# Patient Record
Sex: Male | Born: 1945 | Race: White | Hispanic: No | Marital: Single | Smoking: Former smoker
Health system: Southern US, Community
[De-identification: ages and names within clinical notes are randomized; demographics above are authoritative.]

## PROBLEM LIST (undated history)

## (undated) DIAGNOSIS — I480 Paroxysmal atrial fibrillation: Secondary | ICD-10-CM

## (undated) DIAGNOSIS — R490 Dysphonia: Secondary | ICD-10-CM

## (undated) DIAGNOSIS — N189 Chronic kidney disease, unspecified: Secondary | ICD-10-CM

## (undated) DIAGNOSIS — I10 Essential (primary) hypertension: Secondary | ICD-10-CM

## (undated) DIAGNOSIS — E785 Hyperlipidemia, unspecified: Secondary | ICD-10-CM

## (undated) DIAGNOSIS — J984 Other disorders of lung: Secondary | ICD-10-CM

## (undated) DIAGNOSIS — H9190 Unspecified hearing loss, unspecified ear: Secondary | ICD-10-CM

## (undated) DIAGNOSIS — Z8669 Personal history of other diseases of the nervous system and sense organs: Secondary | ICD-10-CM

## (undated) DIAGNOSIS — Z951 Presence of aortocoronary bypass graft: Secondary | ICD-10-CM

## (undated) DIAGNOSIS — J383 Other diseases of vocal cords: Secondary | ICD-10-CM

## (undated) DIAGNOSIS — Z8639 Personal history of other endocrine, nutritional and metabolic disease: Secondary | ICD-10-CM

## (undated) DIAGNOSIS — I255 Ischemic cardiomyopathy: Secondary | ICD-10-CM

## (undated) DIAGNOSIS — S2242XA Multiple fractures of ribs, left side, initial encounter for closed fracture: Secondary | ICD-10-CM

## (undated) DIAGNOSIS — I2510 Atherosclerotic heart disease of native coronary artery without angina pectoris: Secondary | ICD-10-CM

## (undated) DIAGNOSIS — R0600 Dyspnea, unspecified: Secondary | ICD-10-CM

## (undated) DIAGNOSIS — Z9889 Other specified postprocedural states: Secondary | ICD-10-CM

## (undated) DIAGNOSIS — R55 Syncope and collapse: Principal | ICD-10-CM

## (undated) DIAGNOSIS — I5022 Chronic systolic (congestive) heart failure: Secondary | ICD-10-CM

## (undated) DIAGNOSIS — R911 Solitary pulmonary nodule: Secondary | ICD-10-CM

## (undated) DIAGNOSIS — N400 Enlarged prostate without lower urinary tract symptoms: Secondary | ICD-10-CM

## (undated) HISTORY — DX: Ischemic cardiomyopathy: I25.5

## (undated) HISTORY — PX: WISDOM TOOTH EXTRACTION: SHX21

## (undated) HISTORY — PX: EYE SURGERY: SHX253

## (undated) HISTORY — DX: Hyperlipidemia, unspecified: E78.5

## (undated) HISTORY — DX: Solitary pulmonary nodule: R91.1

## (undated) HISTORY — DX: Dysphonia: R49.0

## (undated) HISTORY — DX: Chronic kidney disease, unspecified: N18.9

## (undated) HISTORY — DX: Other diseases of vocal cords: J38.3

## (undated) HISTORY — PX: OTHER SURGICAL HISTORY: SHX169

## (undated) HISTORY — DX: Unspecified hearing loss, unspecified ear: H91.90

## (undated) HISTORY — DX: Atherosclerotic heart disease of native coronary artery without angina pectoris: I25.10

## (undated) HISTORY — DX: Personal history of other endocrine, nutritional and metabolic disease: Z86.39

## (undated) HISTORY — DX: Dyspnea, unspecified: R06.00

## (undated) SURGERY — Surgical Case
Anesthesia: *Unknown

---

## 1996-04-03 HISTORY — PX: FINGER SURGERY: SHX640

## 1999-04-11 ENCOUNTER — Ambulatory Visit (HOSPITAL_BASED_OUTPATIENT_CLINIC_OR_DEPARTMENT_OTHER): Admission: RE | Admit: 1999-04-11 | Discharge: 1999-04-11 | Payer: Self-pay | Admitting: Surgery

## 2006-11-08 ENCOUNTER — Ambulatory Visit: Payer: Self-pay | Admitting: Internal Medicine

## 2007-01-09 ENCOUNTER — Ambulatory Visit: Payer: Self-pay | Admitting: Internal Medicine

## 2007-03-20 DIAGNOSIS — J984 Other disorders of lung: Secondary | ICD-10-CM | POA: Insufficient documentation

## 2007-03-20 DIAGNOSIS — I10 Essential (primary) hypertension: Secondary | ICD-10-CM | POA: Insufficient documentation

## 2007-03-21 ENCOUNTER — Ambulatory Visit: Payer: Self-pay | Admitting: Internal Medicine

## 2007-04-18 ENCOUNTER — Encounter: Payer: Self-pay | Admitting: Internal Medicine

## 2007-04-24 ENCOUNTER — Telehealth: Payer: Self-pay | Admitting: Internal Medicine

## 2008-08-03 ENCOUNTER — Ambulatory Visit: Payer: Self-pay | Admitting: Internal Medicine

## 2008-09-08 ENCOUNTER — Ambulatory Visit: Payer: Self-pay | Admitting: Internal Medicine

## 2008-09-22 ENCOUNTER — Ambulatory Visit: Payer: Self-pay | Admitting: Internal Medicine

## 2009-07-13 ENCOUNTER — Ambulatory Visit: Payer: Self-pay | Admitting: Internal Medicine

## 2009-08-10 ENCOUNTER — Ambulatory Visit: Payer: Self-pay | Admitting: Internal Medicine

## 2009-09-07 ENCOUNTER — Ambulatory Visit: Payer: Self-pay | Admitting: Internal Medicine

## 2009-10-06 ENCOUNTER — Encounter: Payer: Self-pay | Admitting: Internal Medicine

## 2009-10-14 ENCOUNTER — Ambulatory Visit: Payer: Self-pay | Admitting: Internal Medicine

## 2010-01-07 ENCOUNTER — Telehealth (INDEPENDENT_AMBULATORY_CARE_PROVIDER_SITE_OTHER): Payer: Self-pay | Admitting: *Deleted

## 2010-01-17 ENCOUNTER — Ambulatory Visit: Payer: Self-pay | Admitting: Internal Medicine

## 2010-01-17 DIAGNOSIS — J45909 Unspecified asthma, uncomplicated: Secondary | ICD-10-CM | POA: Insufficient documentation

## 2010-05-03 NOTE — Miscellaneous (Signed)
Summary: Orders Update pft charges  Clinical Lists Changes  Orders: Added new Service order of Carbon Monoxide diffusing w/capacity (94720) - Signed Added new Service order of Lung Volumes (94240) - Signed Added new Service order of Spirometry (Pre & Post) (94060) - Signed 

## 2010-05-03 NOTE — Progress Notes (Signed)
Summary: PA for Dulera > needs OV  Phone Note Outgoing Call   Call placed by: Vernie Murders,  January 07, 2010 2:14 PM Call placed to: Insurer Summary of Call: Recieved PA for [facility] from [facility] [address].  Called to [employer] to initiaite PA.  Awaiting fax to be sent to triage faxline. Initial call taken by: Vernie Murders,  January 07, 2010 2:18 PM  Follow-up for Phone Call        Form in MW lookat to be completed. Follow-up by: Vernie Murders,  January 07, 2010 2:34 PM  Additional Follow-up for Phone Call Additional follow up Details #1::        Per MW- needs ov to complete PA form- can give samples in the meantime. LMOMTCB. Additional Follow-up by: Vernie Murders,  January 13, 2010 3:06 PM    Additional Follow-up for Phone Call Additional follow up Details #2::    Pt returned call to [facility].  Informed of above per MW.  OV scheduled with MW on 10.17.11 at 9:40am and 1 sample of dulera 100/5 left at front for pick up.  Pt aware and verbalized understanding.   Follow-up by: Gweneth Dimitri RN,  January 13, 2010 3:49 PM

## 2010-05-03 NOTE — Assessment & Plan Note (Signed)
Summary: Pulmonary/ f/u ov   Primary Provider/Referring Provider:  none  CC:  Follow up, Cough is better, still coughing with phlem which is clear, pt states he still has SOB, and pt states he feels he can't get quite enough air sometimes.  History of Present Illness: 31 yowm quit smoking 1981  with incidentally noted multiple right middle lobe nodules detected by CT scan in April 2008 during evaluation of what turned out to be nothing more than a callous involving the left posterior rib projecting over the left lower lobe.  Aug 03, 2008 co cough and congestion since 12/2007 , worse in winter, better on mucinex and doe x exertion worse in am.   no hemoptysis or sign doe though not that active. .  Took zpak since 4/28 not really much better.  mucinex or mucinex dm as needed for cough Zegerid 40 one at bedtime x 4 weeks then return to office prednisone 10 4 each am x 2days, 2x2days, 1x2days and stop  100% better  September 08, 2008 ov gradually worse again after prednisone even though stayed on Zegerid, hoarseness and dyspnea worsened with sense of throat congestion, using lots chloraseptic and  cepacol lozenges  September 22, 2008 much better after prednisone  but stil sensation of throat congestion self rx with otc's, admits not consistent with rec diet changes including avoiding mehthol products. rec nexium x 1 month and no benefit and Dr Ezzard Standing said throat was fine.  July 13, 2009 Followup.  Pt c/o dyspnea no better since last seen.  He also c/o cough with thick clear sputum.  He states that he wakes up in the night feeling like something is stuck in throat.  Was told by ENT that he has COPD.  He states that he used his family members primatine mist inhaler and this seemed to "open him up".  Also wants to discuss disability.   walking 2 miles but hills are a problem. no purulent sputum.  rec start dulera  Aug 10, 2009 Follow up, Cough is better, still coughing with phlem which is clear, pt states he  still has SOB, pt states he feels he can't get quite enough air sometimes.  much better on dulera. Pt denies any significant sore throat, dysphagia, itching, sneezing,  nasal congestion or excess secretions,  fever, chills, sweats, unintended wt loss, pleuritic or exertional cp, hempoptysis, noct symptoms.     Allergies: 1)  Sulfa  Past History:  Past Medical History: Hypertension Hoarseness onset 11/09....................Gerald Rosario COPD     - Start dulera 100 2  two times a day July 13, 2009 > better    - HFA 90% Aug 10, 2009   Vital Signs:  Patient profile:   65 year old male Height:      68 inches (172.72 cm) Weight:      177 pounds (80.45 kg) BMI:     27.01 O2 Sat:      98 % on Room air Temp:     96.7 degrees F (35.94 degrees C) oral Pulse rate:   82 / minute BP sitting:   120 / 90  (left arm) Cuff size:   regular  O2 Flow:  Room air CC: Follow up, Cough is better, still coughing with phlem which is clear, pt states he still has SOB, pt states he feels he can't get quite enough air sometimes Comments Meds and allergies Jacklynn Ganong  Aug 10, 2009 9:35 AM     Physical Exam  Additional Exam:  wt  182 September 08, 2008 > 179 September 22, 2008 > 173 July 13, 2009 > 177 Aug 10, 2009  Ambulatory healthy appearing white male in no acute distress mod hoarse with freq tc Afeb with normal vital signs HEENT: nl dentition, turbinates, and orophanx. Nl external ear canals without cough reflex Neck without JVD/Nodes/TM Lungs classic psuedowheeze worse on insp with minimal  exp wheeze bilaterally RRR no s3 or murmur or increase in P2 Abd soft and benign with nl excursion in the supine position. No bruits or organomegaly Ext warm without calf tenderness, cyanosis clubbing or edema      Impression & Recommendations:  Problem # 1:  DYSPNEA (ICD-786.09)  DDX of  difficult airways managment all start with A and  include Adherence, Ace Inhibitors, Acid Reflux, Active Sinus Disease,  Alpha 1 Antitripsin deficiency, Anxiety masquerading as Airways dz,  ABPA,  allergy(esp in young), Aspiration (esp in elderly), Adverse effects of DPI,  Active smokers, plus one B  = Beta blocker use..   Adherence and Acid reflux are the main issues that need continued attention pending return for pft's and cxr   Each maintenance medication was reviewed in detail including most importantly the difference between maintenance prns and under what circumstances the prns are to be used.  In addition, these two groups (for which the patient should keep up with refills) were distinguished from a third group :  meds that are used only short term with the intent to complete a course of therapy and then not refill them.  The med list was then fully reconciled and reorganized to reflect this important distinction.   Medications Added to Medication List This Visit: 1)  Prilosec Otc 20 Mg Tbec (Omeprazole magnesium) .... Take  one 30-60 min before first meal of the day 2)  Pepcid Ac Maximum Strength 20 Mg Tabs (Famotidine) .... One at bedtime 3)  Prednisone 10 Mg Tabs (Prednisone) .... 4 each am x 2days, 2x2days, 1x2days and stop  Other Orders: T-2 View CXR (71020TC) Est. Patient Level II (44010)  Patient Instructions: 1)  Dulera 100 2 puffs first thing  in am and 2 puffs again in pm about 12 hours later  2)  Work on inhaler technique:  relax and blow all the way out then take a nice smooth deep breath back in, triggering the inhaler at same time you start breathing in 3)  Prednisone 10 mg x 6 days 4)  Stay prilosec 20 mg Take  one 30-60 min before first meal of the day and pepcid 20 mg at bedtime 5)  Please schedule a follow-up appointment in 4 weeks with PFT's on return Prescriptions: PREDNISONE 10 MG  TABS (PREDNISONE) 4 each am x 2days, 2x2days, 1x2days and stop  #14 x 0   Entered and Authorized by:   Nyoka Cowden MD   Signed by:   Nyoka Cowden MD on 08/10/2009   Method used:   Electronically to         [facility] [id number]* (retail)       [address] [place], [place]  [address]       Ph: [phone]       Fax: [phone]   RxID:   [id number]   Prevention & Chronic Care Immunizations   Influenza vaccine: Not documented    Tetanus booster: Not documented    Pneumococcal vaccine: Not documented    H. zoster  vaccine: Not documented  Colorectal Screening   Hemoccult: Not documented    Colonoscopy: Not documented  Other Screening   PSA: Not documented   Smoking status: quit  (03/20/2007)  Lipids   Total Cholesterol: Not documented   LDL: Not documented   LDL Direct: Not documented   HDL: Not documented   Triglycerides: Not documented  Hypertension   Last Blood Pressure: 120 / 90  (08/10/2009)   Serum creatinine: Not documented   Serum potassium Not documented  Self-Management Support :    Hypertension self-management support: Not documented

## 2010-05-03 NOTE — Assessment & Plan Note (Signed)
Summary: Pulmonary/ final  f/u hfa now 90% effective, minimal hoarseness   Primary Provider/Referring Provider:  none  CC:  Followup.  Pt states that his breathing has improved.  Doing well with qvar and denies any complaints..  History of Present Illness: 22 yowm quit smoking 1981  with incidentally noted multiple right middle lobe nodules detected by CT scan in April 2008 during evaluation of what turned out to be nothing more than a callous involving the left posterior rib projecting over the left lower lobe.  Aug 03, 2008 co cough and congestion since 12/2007 , worse in winter, better on mucinex and doe x exertion worse in am.   no hemoptysis or sign doe though not that active. .  Took zpak since 4/28 not really much better.  mucinex or mucinex dm as needed for cough Zegerid 40 one at bedtime x 4 weeks then return to office prednisone 10 4 each am x 2days, 2x2days, 1x2days and stop  100% better  September 08, 2008 ov gradually worse again after prednisone even though stayed on Zegerid, hoarseness and dyspnea worsened with sense of throat congestion, using lots chloraseptic and  cepacol lozenges  September 22, 2008 much better after prednisone  but stil sensation of throat congestion self rx with otc's, admits not consistent with rec diet changes including avoiding methol products. rec nexium x 1 month and no benefit and Dr Ezzard Standing said throat was fine.  July 13, 2009 Followup.  Pt c/o dyspnea no better since last seen.  He also c/o cough with thick clear sputum.  He states that he wakes up in the night feeling like something is stuck in throat.  Was told by ENT that he has COPD.  He states that he used his family members primatine mist inhaler and this seemed to "open him up".  Also wants to discuss disability.   walking 2 miles but hills are a problem. no purulent sputum.  rec start dulera  May 10, 2011ov .  much better on dulera.  September 07, 2009 Pt is here for a 4 week f/u appt to discuss PFT  results.   Pt states breathing has improved on Dulera.  Pt states he will occ cough up clear sputum and also c/o "knot in throat" only resolved while on prednisone, then recurred despite max gerd rx.   rec try qvar   October 14, 2009 Followup.  Pt states that his breathing has improved.  Doing well with qvar and denies any complaints. prefers dulera over qvar no real change in voice on qvar and better breathing on dulera 100  - Pt denies any significant sore throat, dysphagia, itching, sneezing,  nasal congestion or excess secretions,  fever, chills, sweats, unintended wt loss, pleuritic or exertional cp, hempoptysis, change in activity tolerance  orthopnea pnd or leg swelling Pt also denies any obvious fluctuation in symptoms with weather or environmental change or other alleviating or aggravating factors.       Allergies: 1)  Sulfa  Past History:  Past Medical History: Hypertension Hoarseness onset 11/09....................Marvetta Gibbons      - neg w/u 09/2008 COPD     - Start dulera 100  July 13, 2009 > better but "knot in throat" so try qvar September 07, 2009 > prefers duoera    - [place] 90% Aug 10, 2009     - PFT's September 07, 2009 wnl x minimal nonspecific mid flow reduction while on dulera  Vital Signs:  Patient  profile:   65 year old male Weight:      176 pounds O2 Sat:      98 % on Room air Temp:     98.0 degrees F oral Pulse rate:   75 / minute BP sitting:   110 / 70  (left arm)  Vitals Entered By: Vernie Murders (October 14, 2009 11:50 AM)  O2 Flow:  Room air  Physical Exam  Additional Exam:  wt  182 September 08, 2008 > 179 September 22, 2008 > 173 July 13, 2009 > 177 Aug 10, 2009 > 176 September 07, 2009 > 176 October 14, 2009  Ambulatory healthy appearing white male in no acute distress minimally hoarse HEENT: nl dentition, turbinates, and orophanx. Nl external ear canals without cough reflex Neck without JVD/Nodes/TM Lungs clear to a and p RRR no s3 or murmur or increase in P2 Abd soft and benign  with nl excursion in the supine position. No bruits or organomegaly Ext warm without calf tenderness, cyanosis clubbing or edema      CXR  Procedure date:  10/14/2009  Findings:       Comparison: PA and lateral chest 03/21/2007 and 08/03/2008.   Findings: Lungs are clear.  Heart size is normal.  No pleural effusion. Old left rib fracture noted.   IMPRESSION: No acute disease.  Stable compared prior exam  Impression & Recommendations:  Problem # 1:  DYSPNEA (ICD-786.09)  All goals of asthma met including optimal function and elimination of symptoms with minimum need for rescue therapy. Contingencies discussed today including the rule of two's for use of proventil.   I spent extra time with the patient today explaining optimal mdi  technique.  This improved from  75-90%   Each maintenance medication was reviewed in detail including most importantly the difference between maintenance and as needed and under what circumstances the prns are to be used.   Orders: Est. Patient Level III (62130) HFA Instruction [phone]) Prescription Created Electronically [phone])  Medications Added to Medication List This Visit: 1)  Dulera 100-5 Mcg/act Aero (Mometasone furo-formoterol fum) .... 2 puffs first thing  in am and 2 puffs again in pm about 12 hours later  Other Orders: T-2 View CXR ([id number])  Patient Instructions: 1)  Need a primary care doctor - Gerarda Fraction or Elam Raymore would be good choice  Prescriptions: DULERA 100-5 MCG/ACT AERO (MOMETASONE FURO-FORMOTEROL FUM) 2 puffs first thing  in am and 2 puffs again in pm about 12 hours later  #1 x 11   Entered and Authorized by:   Nyoka Cowden MD   Signed by:   Nyoka Cowden MD on 10/14/2009   Method used:   Print then Give to Patient   RxID:   [id number] QVAR 80 MCG/ACT  AERS (BECLOMETHASONE DIPROPIONATE) 2 puffs first thing  in am and 2 puffs again in pm about 12 hours later  #1 x 11   Entered and Authorized by:    Nyoka Cowden MD   Signed by:   Nyoka Cowden MD on 10/14/2009   Method used:   Print then Give to Patient   RxID:   [id number]

## 2010-05-03 NOTE — Assessment & Plan Note (Signed)
Summary: Pulmonary/ f/u ov start dulera 100   Primary Provider/Referring Provider:  none  CC:  Followup.  Pt c/o dyspnea no better since last seen.  He also c/o cough with thick clear sputum.  He states that he wakes up in the night feeling like something is stuck in throat.  Was told by ENT that he has COPD.  He states that he used his family members primatine mist inhaler and this seemed to "open him up".  Also wants to discuss disability.  .  History of Present Illness: 47 yowm quit smoking 1981  with incidentally noted multiple right middle lobe nodules detected by CT scan in April 2008 during evaluation of what turned out to be nothing more than a callous involving the left posterior rib projecting over the left lower lobe.  Aug 03, 2008 co cough and congestion since 12/2007 , worse in winter, better on mucinex and doe x exertion worse in am.   no hemoptysis or sign doe though not that active. .  Took zpak since 4/28 not really much better.  mucinex or mucinex dm as needed for cough Zegerid 40 one at bedtime x 4 weeks then return to office prednisone 10 4 each am x 2days, 2x2days, 1x2days and stop  100% better  September 08, 2008 ov gradually worse again after prednisone even though stayed on Zegerid, hoarseness and dyspnea worsened with sense of throat congestion, using lots chloraseptic and  cepacol lozenges  September 22, 2008 much better after prednisone  but stil sensation of throat congestion self rx with otc's, admits not consistent with rec diet changes including avoiding mehthol products. rec nexium x 1 month and no benefit and Dr Ezzard Standing said throat was fine.  July 13, 2009 Followup.  Pt c/o dyspnea no better since last seen.  He also c/o cough with thick clear sputum.  He states that he wakes up in the night feeling like something is stuck in throat.  Was told by ENT that he has COPD.  He states that he used his family members primatine mist inhaler and this seemed to "open him up".  Also  wants to discuss disability.   walking 2 miles but hills are a problem. no purulent sputum.  Pt denies any significant sore throat, dysphagia, itching, sneezing,  nasal congestion or excess secretions,  fever, chills, sweats, unintended wt loss, pleuritic or exertional cp, hempoptysis, change in activity tolerance  orthopnea pnd or leg swelling.       Current Medications (verified): 1)  None  Allergies (verified): 1)  Sulfa  Past History:  Past Medical History: Hypertension Hoarseness onset 11/09....................Marvetta Gibbons COPD     - Start dulera 100 2  two times a day July 13, 2009   Social History: Former smoker.  Quit in 1982.  Smoked up to 2 ppd x 18 years. Unemployed  Vital Signs:  Patient profile:   65 year old male Weight:      173 pounds BMI:     26.40 O2 Sat:      96 % on Room air Temp:     97.4 degrees F oral Pulse rate:   86 / minute BP sitting:   120 / 78  (left arm)  Vitals Entered By: Vernie Murders (July 13, 2009 10:36 AM)  O2 Flow:  Room air  Physical Exam  Additional Exam:  wt  182 September 08, 2008 > 179 September 22, 2008 > 173 July 13, 2009  Ambulatory healthy appearing  white male in no acute distress mod hoarse with freq tc Afeb with normal vital signs HEENT: nl dentition, turbinates, and orophanx. Nl external ear canals without cough reflex Neck without JVD/Nodes/TM Lungs classic psuedowheeze worse on insp with some true exp wheeze bilaterally RRR no s3 or murmur or increase in P2 Abd soft and benign with nl excursion in the supine position. No bruits or organomegaly Ext warm without calf tenderness, cyanosis clubbing or edema      Impression & Recommendations:  Problem # 1:  COUGH (ICD-786.2)  The most common causes of chronic cough in immunocompetent adults include: upper airway cough syndrome (UACS), previously referred to as postnasal drip syndrome,  caused by variety of rhinosinus conditions; (2) asthma; (3) GERD; (4) chronic bronchitis  from cigarette smoking or other inhaled environmental irritants; (5) nonasthmatic eosinophilic bronchitis; and (6) bronchiectasis. These conditions, singly or in combination, have accounted for up to 94% of the causes of chronic cough in prospective studies.   Since not responsive to nexium this is probably chronic asthma masquerading as uacs so try dulera.  I spent extra time with the patient today explaining optimal mdi  technique.  This improved from  25-75%  Orders: Est. Patient Level II (16109)  Medications Added to Medication List This Visit: 1)  Dulera 100-5 Mcg/act Aero (Mometasone furo-formoterol fum) .... 2 puffs first thing  in am and 2 puffs again in pm about 12 hours later 2)  Prednisone 10 Mg Tabs (Prednisone) .... 4 each am x 2days, 2x2days, 1x2days and stop  Patient Instructions: 1)  Dulera 100 2 puffs first thing  in am and 2 puffs again in pm about 12 hours later  2)  Work on inhaler technique:  relax and blow all the way out then take a nice smooth deep breath back in, triggering the inhaler at same time you start breathing in and hold a few seconds 3)  GERD (REFLUX)  is a common cause of respiratory symptoms. It commonly presents without heartburn and can be treated with medication, but also with lifestyle changes including avoidance of late meals, excessive alcohol, smoking cessation, and avoid fatty foods, chocolate, peppermint, colas, red wine, and acidic juices such as orange juice. NO MINT OR MENTHOL PRODUCTS SO NO COUGH DROPS  4)  USE SUGARLESS CANDY INSTEAD (jolley ranchers)  5)  NO OIL BASED VITAMINS 6)  Please schedule a follow-up appointment in 4 weeks, sooner if needed  Prescriptions: PREDNISONE 10 MG  TABS (PREDNISONE) 4 each am x 2days, 2x2days, 1x2days and stop  #14 x 0   Entered and Authorized by:   Nyoka Cowden MD   Signed by:   Nyoka Cowden MD on 07/13/2009   Method used:   Electronically to        [facility] [address] [id number]* (retail)       [address] [place], [place]  [address]       Ph: [phone]       Fax: [phone]   RxID:   [id number]

## 2010-05-03 NOTE — Assessment & Plan Note (Signed)
Summary: Pulmonary/ ext ov with hfa teaching    Primary Provider/Referring Provider:  none  CC:  Pt is here for a 4 week f/u appt to discuss PFT results.   Pt states breathing has improved on Dulera.  Pt states he will occ cough up clear sputum and also c/o "knot in throat" .  History of Present Illness: 66 yowm quit smoking 1981  with incidentally noted multiple right middle lobe nodules detected by CT scan in April 2008 during evaluation of what turned out to be nothing more than a callous involving the left posterior rib projecting over the left lower lobe.  Aug 03, 2008 co cough and congestion since 12/2007 , worse in winter, better on mucinex and doe x exertion worse in am.   no hemoptysis or sign doe though not that active. .  Took zpak since 4/28 not really much better.  mucinex or mucinex dm as needed for cough Zegerid 40 one at bedtime x 4 weeks then return to office prednisone 10 4 each am x 2days, 2x2days, 1x2days and stop  100% better  September 08, 2008 ov gradually worse again after prednisone even though stayed on Zegerid, hoarseness and dyspnea worsened with sense of throat congestion, using lots chloraseptic and  cepacol lozenges  September 22, 2008 much better after prednisone  but stil sensation of throat congestion self rx with otc's, admits not consistent with rec diet changes including avoiding methol products. rec nexium x 1 month and no benefit and Dr Ezzard Standing said throat was fine.  July 13, 2009 Followup.  Pt c/o dyspnea no better since last seen.  He also c/o cough with thick clear sputum.  He states that he wakes up in the night feeling like something is stuck in throat.  Was told by ENT that he has COPD.  He states that he used his family members primatine mist inhaler and this seemed to "open him up".  Also wants to discuss disability.   walking 2 miles but hills are a problem. no purulent sputum.  rec start dulera  May 10, 2011ov .  much better on dulera.  September 07, 2009 Pt is  here for a 4 week f/u appt to discuss PFT results.   Pt states breathing has improved on Dulera.  Pt states he will occ cough up clear sputum and also c/o "knot in throat" only resolved while on prednisone, then recurred despite max gerd rx.  Pt denies any significant sore throat, dysphagia, itching, sneezing,  nasal congestion or excess secretions. cp.     Current Medications (verified): 1)  Dulera 100-5 Mcg/act Aero (Mometasone Furo-Formoterol Fum) .... 2 Puffs First Thing  in Am and 2 Puffs Again in Pm About 12 Hours Later 2)  Prilosec Otc 20 Mg Tbec (Omeprazole Magnesium) .... Take  One 30-60 Min Before First Meal of The Day 3)  Pepcid Ac Maximum Strength 20 Mg Tabs (Famotidine) .... One At Bedtime  Allergies (verified): 1)  Sulfa  Past History:  Past Medical History: Hypertension Hoarseness onset 11/09....................C Newman      - neg w/u 09/2008 COPD     - Start dulera 100 2  two times a day July 13, 2009 > better but "knot in throat" so try qvar September 07, 2009     - HFA 90% Aug 10, 2009     - PFT's September 07, 2009 wnl x minimal nonspecific mid flow reduction while on dulera  Vital Signs:  Patient  profile:   65 year old male Height:      68 inches Weight:      176 pounds BMI:     26.86 O2 Sat:      96 % on Room air Temp:     97.6 degrees F oral Pulse rate:   86 / minute BP sitting:   112 / 78  (left arm) Cuff size:   regular  Vitals Entered By: Arman Filter LPN (September 08, 1091 12:27 PM)  O2 Flow:  Room air CC: Pt is here for a 4 week f/u appt to discuss PFT results.   Pt states breathing has improved on Dulera.  Pt states he will occ cough up clear sputum and also c/o "knot in throat"  Comments Medications reviewed with patient Arman Filter LPN  September 07, 2353 12:31 PM    Physical Exam  Additional Exam:  wt  182 September 08, 2008 > 179 September 22, 2008 > 173 July 13, 2009 > 177 Aug 10, 2009 > 176 September 07, 2009  Ambulatory healthy appearing white male in no acute  distress minimally hoarse HEENT: nl dentition, turbinates, and orophanx. Nl external ear canals without cough reflex Neck without JVD/Nodes/TM Lungs classic psuedowheeze worse on insp with minimal  exp wheeze bilaterally RRR no s3 or murmur or increase in P2 Abd soft and benign with nl excursion in the supine position. No bruits or organomegaly Ext warm without calf tenderness, cyanosis clubbing or edema      Impression & Recommendations:  Problem # 1:  DYSPNEA (ICD-786.09)  The following medications were removed from the medication list:    Dulera 100-5 Mcg/act Aero (Mometasone furo-formoterol fum) .Marland Kitchen... 2 puffs first thing  in am and 2 puffs again in pm about 12 hours later His updated medication list for this problem includes:    Qvar 80 Mcg/act Aers (Beclomethasone dipropionate) .Marland Kitchen... 2 puffs first thing  in am and 2 puffs again in pm about 12 hours later    Proventil Hfa 108 (90 Base) Mcg/act Aers (Albuterol sulfate) .Marland Kitchen... 1-2 puffs every 4-6 hours if needed  All goals of asthma met including optimal function and elimination of symptoms with minimum need for rescue therapy. Contingencies discussed today including the rule of two's for use of proventil.   I spent extra time with the patient today explaining optimal mdi  technique.  This improved from  75-90%  Orders: Est. Patient Level III (73220)  Problem # 2:  COUGH (ICD-786.2)  Still with throat irritation ? could this be from ICS - try the least irritating (Qvar 80) and see if asthma stable and cough/throat irritation resolve- if asthma ok and cough not better then try the Qvar 40 at next ov  Orders: Est. Patient Level III (25427)  Medications Added to Medication List This Visit: 1)  Qvar 80 Mcg/act Aers (Beclomethasone dipropionate) .... 2 puffs first thing  in am and 2 puffs again in pm about 12 hours later 2)  Proventil Hfa 108 (90 Base) Mcg/act Aers (Albuterol sulfate) .Marland Kitchen.. 1-2 puffs every 4-6 hours if  needed  Patient Instructions: 1)  Qvar 80 2 puffs first thing  in am and 2 puffs again in pm about 12 hours later  2)  Work on perfecting inhaler technique:  relax and blow all the way out then take a nice smooth deep breath back in, triggering the inhaler at same time you start breathing in and hold a few secs 3)  Proventil can be used for any breakthru symptoms 4)  Return to office in 3 months, sooner if needed

## 2010-05-03 NOTE — Assessment & Plan Note (Signed)
Summary: Pulmonary/ ext ov with hfa 90% changed to advair 115 for ins   Primary Provider/Referring Provider:  none  CC:  Dulera not covered by insurance. Marland Kitchen  History of Present Illness: 54 yowm quit smoking 1981  with incidentally noted multiple right middle lobe nodules detected by CT scan in April 2008 during evaluation of what turned out to be nothing more than a callous involving the left posterior rib projecting over the left lower lobe.  Aug 03, 2008 co cough and congestion since 12/2007 , worse in winter, better on mucinex and doe x exertion worse in am.   no hemoptysis or sign doe though not that active. .  Took zpak since 4/28 not really much better.  mucinex or mucinex dm as needed for cough Zegerid 40 one at bedtime x 4 weeks then return to office prednisone 10 4 each am x 2days, 2x2days, 1x2days and stop  100% better  September 08, 2008 ov gradually worse again after prednisone even though stayed on Zegerid, hoarseness and dyspnea worsened with sense of throat congestion, using lots chloraseptic and  cepacol lozenges  September 22, 2008 much better after prednisone  but stil sensation of throat congestion self rx with otc's, admits not consistent with rec diet changes including avoiding methol products. rec nexium x 1 month and no benefit and Dr Ezzard Standing said throat was fine.  July 13, 2009 Followup.  Pt c/o dyspnea no better since last seen.  He also c/o cough with thick clear sputum.  He states that he wakes up in the night feeling like something is stuck in throat.  Was told by ENT that he has COPD.  He states that he used his family members primatine mist inhaler and this seemed to "open him up".  Also wants to discuss disability.   walking 2 miles but hills are a problem. no purulent sputum.  rec start dulera 100  > better    September 07, 2009 Pt is here for a 4 week f/u appt to discuss PFT results.   Pt states breathing has improved on Dulera.  Pt states he will occ cough up clear sputum and  also c/o "knot in throat" only resolved while on prednisone, then recurred despite max gerd rx.   rec try qvar   October 14, 2009 Followup.  Pt states that his breathing has improved.  Doing well with qvar and denies any complaints. prefers dulera over qvar no real change in voice on qvar and better breathing on dulera 100 >  no changes in rec.  January 17, 2010 ov cc cough/ congestion p stopped  Elwin Sleight  because not  covered by insurance  but qvar is.   Advair is covered at equal level with qvar and he did not feel qvar worked as well for his "congestion".  Pt denies any significant sore throat, dysphagia, itching, sneezing,  nasal congestion or excess secretions,  fever, chills, sweats, unintended wt loss, pleuritic or exertional cp, hempoptysis, change in activity tolerance  orthopnea pnd or leg swelling.  Pt also denies any obvious fluctuation in symptoms with weather or environmental change or other alleviating or aggravating factors.     Pt denies any increase in rescue therapy over baseline, denies waking up needing it or having early am exacerbations of coughing/wheezing/ or dyspnea though was using lots of primatene off dulera but got dulera 200 sample 10/14 and now improving back to baseline  Current Medications (verified): 1)  Prilosec Otc 20 Mg  Tbec (Omeprazole Magnesium) .... Take  One 30-60 Min Before First Meal of The Day 2)  Pepcid Ac Maximum Strength 20 Mg Tabs (Famotidine) .... One At Bedtime 3)  Proventil Hfa 108 (90 Base) Mcg/act  Aers (Albuterol Sulfate) .Marland Kitchen.. 1-2 Puffs Every 4-6 Hours If Needed 4)  Dulera 100-5 Mcg/act Aero (Mometasone Furo-Formoterol Fum) .... 2 Puffs First Thing  in Am and 2 Puffs Again in Pm About 12 Hours Later  Allergies (verified): 1)  Sulfa  Past History:  Past Medical History: Hypertension Hoarseness onset 11/09....................Marvetta Gibbons      - neg w/u 09/2008 Chronic asthma     - Start dulera 100  July 13, 2009 > better but "knot in throat" so try  qvar September 07, 2009 > preferred  [place]    - HFA 90% Aug 10, 2009 > 90% January 17, 2010     - PFT's September 07, 2009 wnl x minimal nonspecific mid flow reduction while on dulera    - Changed to advair intermediate strength January 17, 2010 due to ins issue  Vital Signs:  Patient profile:   65 year old male Weight:      178 pounds O2 Sat:      97 % on Room air Temp:     97.7 degrees F oral Pulse rate:   82 / minute BP sitting:   150 / 80  (left arm)  Vitals Entered By: Vernie Murders (January 17, 2010 9:26 AM)  O2 Flow:  Room air  Physical Exam  Additional Exam:  wt  182 September 08, 2008  > 177 Aug 10, 2009 > 176 September 07, 2009 > 176 October 14, 2009 > 178 January 17, 2010  Ambulatory healthy appearing white male in no acute distress minimally hoarse HEENT: nl dentition, turbinates, and orophanx. Nl external ear canals without cough reflex Neck without JVD/Nodes/TM Lungs bilateral mid exp rhonchi  RRR no s3 or murmur or increase in P2 Abd soft and benign with nl excursion in the supine position. No bruits or organomegaly Ext warm without calf tenderness, cyanosis clubbing or edema      Impression & Recommendations:  Problem # 1:  ASTHMA, UNSPECIFIED (ICD-493.90) All goals of asthma met including optimal function and elimination of symptoms with minimum need for rescue therapy. Contingencies discussed today including the rule of two's as long as he was on combination rx with dulera   Try advair intermediate strength  I spent extra time with the patient today explaining optimal mdi  technique.  This improved from  75-90%  I had an extended discussion with the patient today lasting 15 to 20 minutes of a 25 minute visit on the following issues:  Each maintenance medication was reviewed in detail including most importantly the difference between maintenance prns and under what circumstances the prns are to be used.  In addition, these two groups (for which the patient should keep up with  refills) were distinguished from a third group :  meds that are used only short term with the intent to complete a course of therapy and then not refill them.  The med list was then fully reconciled and reorganized to reflect this important distinction.   Medications Added to Medication List This Visit: 1)  Advair Hfa 115-21 Mcg/act Aero (Fluticasone-salmeterol) .... 2 puffs first thing  in am and 2 puffs again in pm about 12 hours later 2)  Dulera 100-5 Mcg/act Aero (Mometasone furo-formoterol fum) .... 2 puffs puffs twice  daily and switch over to advair when your finished  Other Orders: Est. Patient Level IV (04540) Est. Patient Level IV (98119)  Patient Instructions: 1)  Work on perfecting inhaler technique:  relax and blow all the way out then take a nice smooth deep breath back in, triggering the inhaler at same time you start breathing in  2)  Finish dulera then start the advair hfa 2 puffs first thing  in am and 2 puffs again in pm about 12 hours later  3)  If your breathing worsens or you need to use your rescue inhaler more than twice weekly or wake up more than twice a month with any respiratory symptoms or require more than two rescue inhalers per year, we need to see you right away. 4)   Refills can be through your primary doctor if you're doing well so pulmonary follow up is as needed Prescriptions: ADVAIR HFA 115-21 MCG/ACT  AERO (FLUTICASONE-SALMETEROL) 2 puffs first thing  in am and 2 puffs again in pm about 12 hours later  #1 x 11   Entered and Authorized by:   Nyoka Cowden MD   Signed by:   Nyoka Cowden MD on 01/17/2010   Method used:   Print then Give to Patient   RxID:   [id number]

## 2010-06-15 ENCOUNTER — Encounter: Payer: Medicare Other | Attending: Family Medicine

## 2010-06-15 DIAGNOSIS — Z713 Dietary counseling and surveillance: Secondary | ICD-10-CM | POA: Insufficient documentation

## 2010-06-15 DIAGNOSIS — E119 Type 2 diabetes mellitus without complications: Secondary | ICD-10-CM | POA: Insufficient documentation

## 2010-07-12 ENCOUNTER — Emergency Department (HOSPITAL_COMMUNITY): Payer: Medicare Other

## 2010-07-12 ENCOUNTER — Emergency Department (HOSPITAL_COMMUNITY)
Admission: EM | Admit: 2010-07-12 | Discharge: 2010-07-12 | Discharge status: Against Medical Advice | Payer: Medicare Other | Attending: Emergency Medicine | Admitting: Emergency Medicine

## 2010-07-12 DIAGNOSIS — R55 Syncope and collapse: Secondary | ICD-10-CM | POA: Insufficient documentation

## 2010-07-12 DIAGNOSIS — Z79899 Other long term (current) drug therapy: Secondary | ICD-10-CM | POA: Insufficient documentation

## 2010-07-12 DIAGNOSIS — R42 Dizziness and giddiness: Secondary | ICD-10-CM | POA: Insufficient documentation

## 2010-07-12 DIAGNOSIS — R9439 Abnormal result of other cardiovascular function study: Secondary | ICD-10-CM | POA: Insufficient documentation

## 2010-07-12 DIAGNOSIS — E789 Disorder of lipoprotein metabolism, unspecified: Secondary | ICD-10-CM | POA: Insufficient documentation

## 2010-07-12 DIAGNOSIS — E119 Type 2 diabetes mellitus without complications: Secondary | ICD-10-CM | POA: Insufficient documentation

## 2010-07-12 DIAGNOSIS — I10 Essential (primary) hypertension: Secondary | ICD-10-CM | POA: Insufficient documentation

## 2010-07-12 LAB — URINALYSIS, ROUTINE W REFLEX MICROSCOPIC
Glucose, UA: NEGATIVE mg/dL
Hgb urine dipstick: NEGATIVE
Ketones, ur: 15 mg/dL — AB
Leukocytes, UA: NEGATIVE
Nitrite: NEGATIVE
Protein, ur: 100 mg/dL — AB
Specific Gravity, Urine: 1.011 (ref 1.005–1.030)
Urobilinogen, UA: 2 mg/dL — ABNORMAL HIGH (ref 0.0–1.0)
pH: 7.5 (ref 5.0–8.0)

## 2010-07-12 LAB — CK TOTAL AND CKMB (NOT AT ARMC)
CK, MB: 10.5 ng/mL (ref 0.3–4.0)
Relative Index: 4.2 — ABNORMAL HIGH (ref 0.0–2.5)
Total CK: 252 U/L — ABNORMAL HIGH (ref 7–232)

## 2010-07-12 LAB — BASIC METABOLIC PANEL
BUN: 10 mg/dL (ref 6–23)
CO2: 29 mEq/L (ref 19–32)
Calcium: 9.2 mg/dL (ref 8.4–10.5)
Chloride: 98 mEq/L (ref 96–112)
Creatinine, Ser: 1.07 mg/dL (ref 0.4–1.5)
GFR calc Af Amer: >60 mL/min (ref >60)
GFR calc non Af Amer: >60 mL/min (ref >60)
Glucose, Bld: 138 mg/dL — ABNORMAL HIGH (ref 70–99)
Potassium: 4.7 mEq/L (ref 3.5–5.1)
Sodium: 137 mEq/L (ref 135–145)

## 2010-07-12 LAB — DIFFERENTIAL
Basophils Absolute: 0 10*3/uL (ref 0.0–0.1)
Basophils Relative: 0 % (ref 0–1)
Eosinophils Absolute: 0.2 10*3/uL (ref 0.0–0.7)
Eosinophils Relative: 3 % (ref 0–5)
Lymphocytes Relative: 16 % (ref 12–46)
Lymphs Abs: 0.9 10*3/uL (ref 0.7–4.0)
Monocytes Absolute: 0.4 10*3/uL (ref 0.1–1.0)
Monocytes Relative: 7 % (ref 3–12)
Neutro Abs: 4.2 10*3/uL (ref 1.7–7.7)
Neutrophils Relative %: 74 % (ref 43–77)

## 2010-07-12 LAB — CBC
HCT: 40.1 % (ref 39.0–52.0)
Hemoglobin: 12.9 g/dL — ABNORMAL LOW (ref 13.0–17.0)
MCH: 27.3 pg (ref 26.0–34.0)
MCHC: 32.2 g/dL (ref 30.0–36.0)
MCV: 84.8 fL (ref 78.0–100.0)
Platelets: 260 10*3/uL (ref 150–400)
RBC: 4.73 MIL/uL (ref 4.22–5.81)
RDW: 14.3 % (ref 11.5–15.5)
WBC: 5.7 10*3/uL (ref 4.0–10.5)

## 2010-07-12 LAB — URINE MICROSCOPIC-ADD ON

## 2010-07-12 LAB — TROPONIN I: Troponin I: 0.02 ng/mL (ref 0.00–0.06)

## 2010-07-14 ENCOUNTER — Encounter: Payer: Medicare Other | Attending: Family Medicine

## 2010-07-14 DIAGNOSIS — E119 Type 2 diabetes mellitus without complications: Secondary | ICD-10-CM | POA: Insufficient documentation

## 2010-07-14 DIAGNOSIS — Z713 Dietary counseling and surveillance: Secondary | ICD-10-CM | POA: Insufficient documentation

## 2010-08-16 NOTE — Assessment & Plan Note (Signed)
[facility]                             PULMONARY OFFICE NOTE   NAME:Gerald Rosario, Gerald Rosario                            MRN:          [id number]  DATE:11/08/2006                            DOB:          26-May-1945    REFERRING PHYSICIAN:  Lunette Stands, M.D.   HISTORY OF PRESENT ILLNESS:  This is a 66 year old white male who is  here because he is afraid he has lung cancer.  The story is that it  began at [place] this year when he was seen at [facility] by Dr. Florencia Reasons with fever, aches and cough mostly dry cough. This was 100%  better within several weeks after a round of antibiotics, but there was  concern that his chest x-ray might show a pneumonia.  This led to a CT  scan that did not show pneumonia but rather two small pulmonary nodules  in the right mid lung for which a four month follow up CT scan was  recommended.   The patient states he has returned 100% to normal.  He is not  aerobically active, but denies any exertional chest pain, dyspnea,  fevers, chills, sweats, chest pain, cough or unintended weight loss.   PAST MEDICAL HISTORY:  Hypertension.   ALLERGIES:  SULFA.   MEDICATIONS:  None regularly.   SOCIAL HISTORY:  He quit smoking in 1981.   FAMILY HISTORY:  Significant for cancer of the colon in his mother.  Heart disease in his father.   REVIEW OF SYSTEMS:  Taken in detail on the worksheet, negative except as  outlined above.  Negative for rheumatologic complaints.   PHYSICAL EXAMINATION:  GENERAL:  This is an anxious white male in no  acute distress with a blood pressure of 158/90, pulse rate around 100  and slightly irregular.  HEENT:  Unremarkable.  Pharynx is clear.  Normal turbinates, oropharynx  is clear.  Normal dentition.  Ear canals were clear bilaterally.  NECK:  Supple without cervical adenopathy or tenderness.  HEART:  There is an irregular rhythm present.  Slight excavatum  deformity.  Chest is minimally barrel shaped  and minimally hyperresonant  to percussion.  ABDOMEN:  Soft, benign with negative Hoover sign.  EXTREMITIES:  Warm without calf tenderness, cyanosis, clubbing or edema.  Negative for rheumatoid changes.   IMPRESSION:  Multiple pulmonary nodules, none greater than 4 mm and  incidentally picked up on CT scan which did not show any evidence of a  left lung infiltrate or nodule which was the original intent behind  doing the CT scan.  I find this to be a common scenario and agree  completely with Dr Augustin Schooling approach which was conservative.  I  explained to the patient that this is like the new high def TVs where  we see a lot more detail than the old pictures, but this does not  necessarily mean we have a new problem.   These nodules are too small to biopsy, too small for PET scan and  probably of no importance.  However, a followup chest x-ray in  October  2008 was recommended, perhaps in the context of an office visit with a  set of PFT's as well.   Of greater concern is the fact that this patient's mother died of colon  cancer, and if he is concerned about cancer which is a reasonable issue,  I think the better yield than pursuing multiple CT scans would be to  proceed with a colonoscopy and discuss this with him in the context of  his anxiety related to cancer in general.   Finally, note that he has PACs on exam with borderline hypertension.  I  have encouraged him to consider taking beta blockers, but he said he  would rather see if he could treat this by regular exercise and salt  avoidance.  I think this is fine, but if he notices increasing symptoms  or palpitations, especially if he has any sustained rhythm or  presyncope, then beta blockers would be the minimum I would recommend  with low threshold for formal cardiology evaluation, and he needs to  keep an open mind about this.  Follow up will be in October 2008 with  PFT's and chest x-ray.  Will see him sooner if  needed.     Charlaine Dalton. Sherene Sires, MD, [facility]  Electronically Signed    MBW/MedQ  DD: 11/08/2006  DT: 11/09/2006  Job #: [id number]   cc:   Lunette Stands, M.D.  Gabriel Earing, M.D.

## 2010-08-19 NOTE — Assessment & Plan Note (Signed)
[facility]                             PULMONARY OFFICE NOTE   NAME:Gerald Rosario, Gerald Rosario                            MRN:          [id number]  DATE:01/10/2007                            DOB:          07-09-1945    REASON FOR CONSULTATION:  Multiple pulmonary nodules.   HISTORY:  This is a 65 year old white male who does recall now that he  broke a rib 15 years ago on the left side and had a chest x-ray.  Underwent a chest x-ray in April of 2008, showing an abnormal density  that by CT scan proved to be nothing more than a healed rib fracture.  However, the same CT scan also suggested two small pulmonary nodules  within the right mid lung well less than 8 mm (only 3 mm actually) and  that he would need followup for these.  The patient is a remote smoker,  having quit in 1981, but denies any significant dyspnea, pleuritic or  exertional chest pain, orthopnea, PND or leg swelling.   PHYSICAL EXAMINATION:  He is a quite pleasant ambulatory man in no acute  distress.  He is afebrile with normal vital signs.  HEENT:  Unremarkable.  OROPHARYNX:  Clear.  LUNG FIELDS:  Completely clear bilaterally to auscultation and  percussion.  HEART:  Regular rate and rhythm without murmur, gallop or rub.  ABDOMEN:  Soft, benign.  EXTREMITIES:  Without any calf tenderness, cyanosis, clubbing or edema.   Chest x-ray today shows no evidence of nodule on the right side.  He  does have the density on the left side that CT scan has documented is a  healed rib fracture.   IMPRESSION:  Multiple pulmonary nodules.  Based on the location (right  middle lobe), extremely remote smoking history and the size of these  lesions, the options are either to do an excisional biopsies now or  follow him conservatively.  I did discuss these options openly with him  and emphasized that in the era of high resolution CT scans, many of the  nodules we are picking up microscopically turn out to be  nothing, but  that followup studies would be appropriate.  If, on the other hand, he  develops any pulmonary symptoms or macroscopic evidence of nodules, such  as can be seen on a plain film, I would proceed directly to an  excisional biopsy of both of these lesions.   He accepted this recommendation and agreed to return in 3 months.  Will  see him sooner if needed.     Charlaine Dalton. Sherene Sires, MD, [facility]  Electronically Signed   MBW/MedQ  DD: 01/10/2007  DT: 01/10/2007  Job #: [id number]   cc:   Florencia Reasons, Dr.

## 2010-10-02 HISTORY — PX: CARDIAC CATHETERIZATION: SHX172

## 2010-10-19 ENCOUNTER — Telehealth: Payer: Self-pay | Admitting: *Deleted

## 2010-10-19 ENCOUNTER — Inpatient Hospital Stay (HOSPITAL_COMMUNITY)
Admission: EM | Admit: 2010-10-19 | Discharge: 2010-10-26 | DRG: 287 | Disposition: A | Discharge status: Home Health | Payer: Medicare Other | Attending: Cardiology | Admitting: Cardiology

## 2010-10-19 ENCOUNTER — Emergency Department (HOSPITAL_COMMUNITY): Payer: Medicare Other

## 2010-10-19 DIAGNOSIS — R918 Other nonspecific abnormal finding of lung field: Secondary | ICD-10-CM | POA: Diagnosis present

## 2010-10-19 DIAGNOSIS — I509 Heart failure, unspecified: Secondary | ICD-10-CM | POA: Diagnosis present

## 2010-10-19 DIAGNOSIS — J449 Chronic obstructive pulmonary disease, unspecified: Secondary | ICD-10-CM | POA: Diagnosis present

## 2010-10-19 DIAGNOSIS — I2589 Other forms of chronic ischemic heart disease: Secondary | ICD-10-CM | POA: Diagnosis present

## 2010-10-19 DIAGNOSIS — Z87891 Personal history of nicotine dependence: Secondary | ICD-10-CM

## 2010-10-19 DIAGNOSIS — I2789 Other specified pulmonary heart diseases: Secondary | ICD-10-CM | POA: Diagnosis present

## 2010-10-19 DIAGNOSIS — I2582 Chronic total occlusion of coronary artery: Secondary | ICD-10-CM | POA: Diagnosis present

## 2010-10-19 DIAGNOSIS — M549 Dorsalgia, unspecified: Secondary | ICD-10-CM | POA: Diagnosis present

## 2010-10-19 DIAGNOSIS — I2510 Atherosclerotic heart disease of native coronary artery without angina pectoris: Secondary | ICD-10-CM | POA: Diagnosis present

## 2010-10-19 DIAGNOSIS — I059 Rheumatic mitral valve disease, unspecified: Secondary | ICD-10-CM | POA: Diagnosis present

## 2010-10-19 DIAGNOSIS — I10 Essential (primary) hypertension: Secondary | ICD-10-CM | POA: Diagnosis present

## 2010-10-19 DIAGNOSIS — I5023 Acute on chronic systolic (congestive) heart failure: Principal | ICD-10-CM | POA: Diagnosis present

## 2010-10-19 DIAGNOSIS — Z9119 Patient's noncompliance with other medical treatment and regimen: Secondary | ICD-10-CM

## 2010-10-19 DIAGNOSIS — Z882 Allergy status to sulfonamides status: Secondary | ICD-10-CM

## 2010-10-19 DIAGNOSIS — N400 Enlarged prostate without lower urinary tract symptoms: Secondary | ICD-10-CM | POA: Diagnosis present

## 2010-10-19 DIAGNOSIS — Z7982 Long term (current) use of aspirin: Secondary | ICD-10-CM

## 2010-10-19 DIAGNOSIS — R609 Edema, unspecified: Secondary | ICD-10-CM

## 2010-10-19 DIAGNOSIS — J4489 Other specified chronic obstructive pulmonary disease: Secondary | ICD-10-CM | POA: Diagnosis present

## 2010-10-19 DIAGNOSIS — Z91199 Patient's noncompliance with other medical treatment and regimen due to unspecified reason: Secondary | ICD-10-CM

## 2010-10-19 DIAGNOSIS — E785 Hyperlipidemia, unspecified: Secondary | ICD-10-CM | POA: Diagnosis present

## 2010-10-19 DIAGNOSIS — E119 Type 2 diabetes mellitus without complications: Secondary | ICD-10-CM | POA: Diagnosis present

## 2010-10-19 DIAGNOSIS — R490 Dysphonia: Secondary | ICD-10-CM | POA: Diagnosis present

## 2010-10-19 HISTORY — DX: Essential (primary) hypertension: I10

## 2010-10-19 LAB — GLUCOSE, CAPILLARY
Glucose-Capillary: 120 mg/dL — ABNORMAL HIGH (ref 70–99)
Glucose-Capillary: 105 mg/dL — ABNORMAL HIGH (ref 70–99)
Glucose-Capillary: 149 mg/dL — ABNORMAL HIGH (ref 70–99)

## 2010-10-19 LAB — PRO B NATRIURETIC PEPTIDE: Pro B Natriuretic peptide (BNP): 9932 pg/mL — ABNORMAL HIGH (ref 0–125)

## 2010-10-19 LAB — CBC
HCT: 41.1 % (ref 39.0–52.0)
Hemoglobin: 13.8 g/dL (ref 13.0–17.0)
MCH: 27 pg (ref 26.0–34.0)
MCHC: 33.6 g/dL (ref 30.0–36.0)
MCV: 80.4 fL (ref 78.0–100.0)
Platelets: 194 10*3/uL (ref 150–400)
RBC: 5.11 MIL/uL (ref 4.22–5.81)
RDW: 19.8 % — ABNORMAL HIGH (ref 11.5–15.5)
WBC: 4.9 10*3/uL (ref 4.0–10.5)

## 2010-10-19 LAB — DIFFERENTIAL
Basophils Absolute: 0 10*3/uL (ref 0.0–0.1)
Basophils Relative: 0 % (ref 0–1)
Eosinophils Absolute: 0.1 10*3/uL (ref 0.0–0.7)
Eosinophils Relative: 2 % (ref 0–5)
Lymphocytes Relative: 28 % (ref 12–46)
Lymphs Abs: 1.4 10*3/uL (ref 0.7–4.0)
Monocytes Absolute: 0.6 10*3/uL (ref 0.1–1.0)
Monocytes Relative: 12 % (ref 3–12)
Neutro Abs: 2.8 10*3/uL (ref 1.7–7.7)
Neutrophils Relative %: 58 % (ref 43–77)

## 2010-10-19 LAB — COMPREHENSIVE METABOLIC PANEL
ALT: 16 U/L (ref 0–53)
AST: 26 U/L (ref 0–37)
Albumin: 2.8 g/dL — ABNORMAL LOW (ref 3.5–5.2)
Alkaline Phosphatase: 164 U/L — ABNORMAL HIGH (ref 39–117)
BUN: 17 mg/dL (ref 6–23)
CO2: 24 mEq/L (ref 19–32)
Calcium: 9 mg/dL (ref 8.4–10.5)
Chloride: 103 mEq/L (ref 96–112)
Creatinine, Ser: 0.84 mg/dL (ref 0.50–1.35)
GFR calc Af Amer: >60 mL/min (ref >60)
GFR calc non Af Amer: >60 mL/min (ref >60)
Glucose, Bld: 106 mg/dL — ABNORMAL HIGH (ref 70–99)
Potassium: 4.4 mEq/L (ref 3.5–5.1)
Sodium: 137 mEq/L (ref 135–145)
Total Bilirubin: 2.6 mg/dL — ABNORMAL HIGH (ref 0.3–1.2)
Total Protein: 6.5 g/dL (ref 6.0–8.3)

## 2010-10-19 LAB — CK TOTAL AND CKMB (NOT AT ARMC)
CK, MB: 3.8 ng/mL (ref 0.3–4.0)
Relative Index: INVALID (ref 0.0–2.5)
Total CK: 86 U/L (ref 7–232)

## 2010-10-19 LAB — D-DIMER, QUANTITATIVE (NOT AT ARMC): D-Dimer, Quant: 1.89 ug/mL-FEU — ABNORMAL HIGH (ref 0.00–0.48)

## 2010-10-19 LAB — TROPONIN I: Troponin I: <0.3 ng/mL (ref <0.30)

## 2010-10-19 NOTE — Telephone Encounter (Signed)
Pt calling stating on way to hosp, c/o swelling(waist down) and SOB--pt was given referral to [place], but never followed up with us--has been seen in ED by dr wert--trish notified and will try to f/u with pt --advised pt to go to closest ED now--pt agrees and is on way--nt

## 2010-10-19 NOTE — Telephone Encounter (Signed)
Dr cooper--this pt is on way to ED and called in to see if he could be seen by us--he evidently was referred to [place] after last hospitalization but never f/u with us--he is c/o swelling(below waist) and SOB--i notified trish to watch out for him but wanted you to be aware--i believe you put in a stent while he was in hosp last time?--just an FYI--thanks nt

## 2010-10-20 ENCOUNTER — Inpatient Hospital Stay (HOSPITAL_COMMUNITY): Payer: Medicare Other

## 2010-10-20 ENCOUNTER — Encounter (HOSPITAL_COMMUNITY): Payer: Self-pay | Admitting: Radiology

## 2010-10-20 DIAGNOSIS — I059 Rheumatic mitral valve disease, unspecified: Secondary | ICD-10-CM

## 2010-10-20 LAB — LIPID PANEL
Cholesterol: 128 mg/dL (ref 0–200)
HDL: 34 mg/dL — ABNORMAL LOW (ref >39)
LDL Cholesterol: 83 mg/dL (ref 0–99)
Total CHOL/HDL Ratio: 3.8 RATIO
Triglycerides: 56 mg/dL (ref <150)
VLDL: 11 mg/dL (ref 0–40)

## 2010-10-20 LAB — HEMOGLOBIN A1C
Hgb A1c MFr Bld: 7.3 % — ABNORMAL HIGH (ref <5.7)
Mean Plasma Glucose: 163 mg/dL — ABNORMAL HIGH (ref <117)

## 2010-10-20 LAB — COMPREHENSIVE METABOLIC PANEL
ALT: 14 U/L (ref 0–53)
AST: 24 U/L (ref 0–37)
Albumin: 2.5 g/dL — ABNORMAL LOW (ref 3.5–5.2)
Alkaline Phosphatase: 143 U/L — ABNORMAL HIGH (ref 39–117)
BUN: 17 mg/dL (ref 6–23)
CO2: 24 mEq/L (ref 19–32)
Calcium: 8.8 mg/dL (ref 8.4–10.5)
Chloride: 104 mEq/L (ref 96–112)
Creatinine, Ser: 0.92 mg/dL (ref 0.50–1.35)
GFR calc Af Amer: >60 mL/min (ref >60)
GFR calc non Af Amer: >60 mL/min (ref >60)
Glucose, Bld: 85 mg/dL (ref 70–99)
Potassium: 4.1 mEq/L (ref 3.5–5.1)
Sodium: 139 mEq/L (ref 135–145)
Total Bilirubin: 2.7 mg/dL — ABNORMAL HIGH (ref 0.3–1.2)
Total Protein: 5.8 g/dL — ABNORMAL LOW (ref 6.0–8.3)

## 2010-10-20 LAB — GLUCOSE, CAPILLARY
Glucose-Capillary: 93 mg/dL (ref 70–99)
Glucose-Capillary: 157 mg/dL — ABNORMAL HIGH (ref 70–99)
Glucose-Capillary: 131 mg/dL — ABNORMAL HIGH (ref 70–99)
Glucose-Capillary: 88 mg/dL (ref 70–99)

## 2010-10-20 LAB — TSH: TSH: 3.823 u[IU]/mL (ref 0.350–4.500)

## 2010-10-20 MED ORDER — IOHEXOL 300 MG/ML  SOLN
100.0000 mL | Freq: Once | INTRAMUSCULAR | Status: AC | PRN
  Administered 2010-10-20: 100 mL via INTRAVENOUS

## 2010-10-21 LAB — GLUCOSE, CAPILLARY
Glucose-Capillary: 133 mg/dL — ABNORMAL HIGH (ref 70–99)
Glucose-Capillary: 173 mg/dL — ABNORMAL HIGH (ref 70–99)
Glucose-Capillary: 148 mg/dL — ABNORMAL HIGH (ref 70–99)
Glucose-Capillary: 125 mg/dL — ABNORMAL HIGH (ref 70–99)

## 2010-10-21 LAB — BASIC METABOLIC PANEL
BUN: 17 mg/dL (ref 6–23)
CO2: 25 mEq/L (ref 19–32)
Calcium: 9 mg/dL (ref 8.4–10.5)
Chloride: 103 mEq/L (ref 96–112)
Creatinine, Ser: 0.84 mg/dL (ref 0.50–1.35)
GFR calc Af Amer: >60 mL/min (ref >60)
GFR calc non Af Amer: >60 mL/min (ref >60)
Glucose, Bld: 122 mg/dL — ABNORMAL HIGH (ref 70–99)
Potassium: 4.3 mEq/L (ref 3.5–5.1)
Sodium: 139 mEq/L (ref 135–145)

## 2010-10-22 LAB — BASIC METABOLIC PANEL
BUN: 18 mg/dL (ref 6–23)
CO2: 25 mEq/L (ref 19–32)
Calcium: 9.6 mg/dL (ref 8.4–10.5)
Chloride: 101 mEq/L (ref 96–112)
Creatinine, Ser: 1 mg/dL (ref 0.50–1.35)
GFR calc Af Amer: >60 mL/min (ref >60)
GFR calc non Af Amer: >60 mL/min (ref >60)
Glucose, Bld: 123 mg/dL — ABNORMAL HIGH (ref 70–99)
Potassium: 3.9 mEq/L (ref 3.5–5.1)
Sodium: 139 mEq/L (ref 135–145)

## 2010-10-22 LAB — GLUCOSE, CAPILLARY
Glucose-Capillary: 139 mg/dL — ABNORMAL HIGH (ref 70–99)
Glucose-Capillary: 199 mg/dL — ABNORMAL HIGH (ref 70–99)
Glucose-Capillary: 173 mg/dL — ABNORMAL HIGH (ref 70–99)
Glucose-Capillary: 105 mg/dL — ABNORMAL HIGH (ref 70–99)

## 2010-10-22 LAB — HEPATIC FUNCTION PANEL
ALT: 14 U/L (ref 0–53)
AST: 28 U/L (ref 0–37)
Albumin: 2.8 g/dL — ABNORMAL LOW (ref 3.5–5.2)
Alkaline Phosphatase: 148 U/L — ABNORMAL HIGH (ref 39–117)
Bilirubin, Direct: 1 mg/dL — ABNORMAL HIGH (ref 0.0–0.3)
Indirect Bilirubin: 1.3 mg/dL — ABNORMAL HIGH (ref 0.3–0.9)
Total Bilirubin: 2.3 mg/dL — ABNORMAL HIGH (ref 0.3–1.2)
Total Protein: 6.5 g/dL (ref 6.0–8.3)

## 2010-10-22 LAB — FERRITIN: Ferritin: 40 ng/mL (ref 22–322)

## 2010-10-22 LAB — IRON AND TIBC
Iron: 41 ug/dL — ABNORMAL LOW (ref 42–135)
Saturation Ratios: 14 % — ABNORMAL LOW (ref 20–55)
TIBC: 301 ug/dL (ref 215–435)
UIBC: 260 ug/dL

## 2010-10-23 LAB — BASIC METABOLIC PANEL
BUN: 19 mg/dL (ref 6–23)
CO2: 28 mEq/L (ref 19–32)
Calcium: 9.6 mg/dL (ref 8.4–10.5)
Chloride: 99 mEq/L (ref 96–112)
Creatinine, Ser: 0.99 mg/dL (ref 0.50–1.35)
GFR calc Af Amer: >60 mL/min (ref >60)
GFR calc non Af Amer: >60 mL/min (ref >60)
Glucose, Bld: 131 mg/dL — ABNORMAL HIGH (ref 70–99)
Potassium: 3.9 mEq/L (ref 3.5–5.1)
Sodium: 137 mEq/L (ref 135–145)

## 2010-10-23 LAB — GLUCOSE, CAPILLARY
Glucose-Capillary: 127 mg/dL — ABNORMAL HIGH (ref 70–99)
Glucose-Capillary: 214 mg/dL — ABNORMAL HIGH (ref 70–99)
Glucose-Capillary: 137 mg/dL — ABNORMAL HIGH (ref 70–99)
Glucose-Capillary: 125 mg/dL — ABNORMAL HIGH (ref 70–99)

## 2010-10-23 LAB — PROTIME-INR
INR: 1.21 (ref 0.00–1.49)
Prothrombin Time: 15.6 seconds — ABNORMAL HIGH (ref 11.6–15.2)

## 2010-10-23 LAB — CBC
HCT: 39.2 % (ref 39.0–52.0)
Hemoglobin: 13.2 g/dL (ref 13.0–17.0)
MCH: 26.8 pg (ref 26.0–34.0)
MCHC: 33.7 g/dL (ref 30.0–36.0)
MCV: 79.7 fL (ref 78.0–100.0)
Platelets: 184 10*3/uL (ref 150–400)
RBC: 4.92 MIL/uL (ref 4.22–5.81)
RDW: 19.6 % — ABNORMAL HIGH (ref 11.5–15.5)
WBC: 4.7 10*3/uL (ref 4.0–10.5)

## 2010-10-24 DIAGNOSIS — I509 Heart failure, unspecified: Secondary | ICD-10-CM

## 2010-10-24 LAB — BASIC METABOLIC PANEL
BUN: 20 mg/dL (ref 6–23)
CO2: 26 mEq/L (ref 19–32)
Calcium: 9.4 mg/dL (ref 8.4–10.5)
Chloride: 103 mEq/L (ref 96–112)
Creatinine, Ser: 0.85 mg/dL (ref 0.50–1.35)
GFR calc Af Amer: >60 mL/min (ref >60)
GFR calc non Af Amer: >60 mL/min (ref >60)
Glucose, Bld: 125 mg/dL — ABNORMAL HIGH (ref 70–99)
Potassium: 3.9 mEq/L (ref 3.5–5.1)
Sodium: 139 mEq/L (ref 135–145)

## 2010-10-24 LAB — POCT I-STAT 3, ART BLOOD GAS (G3+)
Acid-Base Excess: 2 mmol/L (ref 0.0–2.0)
Bicarbonate: 24.9 mEq/L — ABNORMAL HIGH (ref 20.0–24.0)
O2 Saturation: 97 %
TCO2: 26 mmol/L (ref 0–100)
pCO2 arterial: 32.3 mmHg — ABNORMAL LOW (ref 35.0–45.0)
pH, Arterial: 7.496 — ABNORMAL HIGH (ref 7.350–7.450)
pO2, Arterial: 80 mmHg (ref 80.0–100.0)

## 2010-10-24 LAB — GLUCOSE, CAPILLARY
Glucose-Capillary: 135 mg/dL — ABNORMAL HIGH (ref 70–99)
Glucose-Capillary: 97 mg/dL (ref 70–99)
Glucose-Capillary: 114 mg/dL — ABNORMAL HIGH (ref 70–99)
Glucose-Capillary: 181 mg/dL — ABNORMAL HIGH (ref 70–99)
Glucose-Capillary: 148 mg/dL — ABNORMAL HIGH (ref 70–99)

## 2010-10-24 LAB — POCT I-STAT 3, VENOUS BLOOD GAS (G3P V)
Acid-Base Excess: 3 mmol/L — ABNORMAL HIGH (ref 0.0–2.0)
Acid-Base Excess: 4 mmol/L — ABNORMAL HIGH (ref 0.0–2.0)
Bicarbonate: 28.1 mEq/L — ABNORMAL HIGH (ref 20.0–24.0)
Bicarbonate: 28.7 mEq/L — ABNORMAL HIGH (ref 20.0–24.0)
O2 Saturation: 55 %
O2 Saturation: 56 %
Patient temperature: 98.6
Patient temperature: 98.6
TCO2: 29 mmol/L (ref 0–100)
TCO2: 30 mmol/L (ref 0–100)
pCO2, Ven: 42.6 mmHg — ABNORMAL LOW (ref 45.0–50.0)
pCO2, Ven: 43.5 mmHg — ABNORMAL LOW (ref 45.0–50.0)
pH, Ven: 7.427 — ABNORMAL HIGH (ref 7.250–7.300)
pH, Ven: 7.427 — ABNORMAL HIGH (ref 7.250–7.300)
pO2, Ven: 28 mmHg — CL (ref 30.0–45.0)
pO2, Ven: 29 mmHg — CL (ref 30.0–45.0)

## 2010-10-24 LAB — T4: T4, Total: 5.9 ug/dL (ref 5.0–12.5)

## 2010-10-24 LAB — T3: T3, Total: 53.7 ng/dl — ABNORMAL LOW (ref 80.0–204.0)

## 2010-10-24 LAB — CORTISOL: Cortisol, Plasma: 19 ug/dL

## 2010-10-25 LAB — BASIC METABOLIC PANEL
BUN: 23 mg/dL (ref 6–23)
CO2: 26 mEq/L (ref 19–32)
Calcium: 8.8 mg/dL (ref 8.4–10.5)
Chloride: 103 mEq/L (ref 96–112)
Creatinine, Ser: 0.96 mg/dL (ref 0.50–1.35)
GFR calc Af Amer: >60 mL/min (ref >60)
GFR calc non Af Amer: >60 mL/min (ref >60)
Glucose, Bld: 133 mg/dL — ABNORMAL HIGH (ref 70–99)
Potassium: 3.9 mEq/L (ref 3.5–5.1)
Sodium: 135 mEq/L (ref 135–145)

## 2010-10-25 LAB — GLUCOSE, CAPILLARY
Glucose-Capillary: 138 mg/dL — ABNORMAL HIGH (ref 70–99)
Glucose-Capillary: 196 mg/dL — ABNORMAL HIGH (ref 70–99)
Glucose-Capillary: 177 mg/dL — ABNORMAL HIGH (ref 70–99)
Glucose-Capillary: 168 mg/dL — ABNORMAL HIGH (ref 70–99)

## 2010-10-25 LAB — CBC
HCT: 36.7 % — ABNORMAL LOW (ref 39.0–52.0)
Hemoglobin: 12 g/dL — ABNORMAL LOW (ref 13.0–17.0)
MCH: 26.3 pg (ref 26.0–34.0)
MCHC: 32.7 g/dL (ref 30.0–36.0)
MCV: 80.3 fL (ref 78.0–100.0)
Platelets: 168 10*3/uL (ref 150–400)
RBC: 4.57 MIL/uL (ref 4.22–5.81)
RDW: 19.4 % — ABNORMAL HIGH (ref 11.5–15.5)
WBC: 4.3 10*3/uL (ref 4.0–10.5)

## 2010-10-26 DIAGNOSIS — I5023 Acute on chronic systolic (congestive) heart failure: Secondary | ICD-10-CM

## 2010-10-26 LAB — GLUCOSE, CAPILLARY
Glucose-Capillary: 153 mg/dL — ABNORMAL HIGH (ref 70–99)
Glucose-Capillary: 164 mg/dL — ABNORMAL HIGH (ref 70–99)

## 2010-11-01 ENCOUNTER — Encounter: Payer: Self-pay | Admitting: Cardiology

## 2010-11-02 ENCOUNTER — Encounter: Payer: Self-pay | Admitting: Cardiology

## 2010-11-07 ENCOUNTER — Ambulatory Visit (INDEPENDENT_AMBULATORY_CARE_PROVIDER_SITE_OTHER): Payer: Medicare Other | Admitting: Cardiology

## 2010-11-07 ENCOUNTER — Encounter: Payer: Self-pay | Admitting: Cardiology

## 2010-11-07 VITALS — BP 122/78 | HR 68 | Resp 16 | Ht 68.0 in | Wt 140.0 lb

## 2010-11-07 DIAGNOSIS — I255 Ischemic cardiomyopathy: Secondary | ICD-10-CM

## 2010-11-07 DIAGNOSIS — I10 Essential (primary) hypertension: Secondary | ICD-10-CM

## 2010-11-07 DIAGNOSIS — I2589 Other forms of chronic ischemic heart disease: Secondary | ICD-10-CM

## 2010-11-07 DIAGNOSIS — E119 Type 2 diabetes mellitus without complications: Secondary | ICD-10-CM

## 2010-11-07 DIAGNOSIS — I2510 Atherosclerotic heart disease of native coronary artery without angina pectoris: Secondary | ICD-10-CM

## 2010-11-07 NOTE — Patient Instructions (Signed)
Your physician recommends that you schedule a follow-up appointment in: 1 month  

## 2010-11-08 ENCOUNTER — Telehealth: Payer: Self-pay | Admitting: Cardiology

## 2010-11-08 ENCOUNTER — Emergency Department (HOSPITAL_COMMUNITY)
Admission: EM | Admit: 2010-11-08 | Discharge: 2010-11-08 | Disposition: A | Discharge status: Home / Self Care | Payer: Medicare Other | Attending: Emergency Medicine | Admitting: Emergency Medicine

## 2010-11-08 DIAGNOSIS — E7800 Pure hypercholesterolemia, unspecified: Secondary | ICD-10-CM | POA: Insufficient documentation

## 2010-11-08 DIAGNOSIS — I255 Ischemic cardiomyopathy: Secondary | ICD-10-CM | POA: Insufficient documentation

## 2010-11-08 DIAGNOSIS — J45909 Unspecified asthma, uncomplicated: Secondary | ICD-10-CM | POA: Insufficient documentation

## 2010-11-08 DIAGNOSIS — E119 Type 2 diabetes mellitus without complications: Secondary | ICD-10-CM | POA: Insufficient documentation

## 2010-11-08 DIAGNOSIS — E875 Hyperkalemia: Secondary | ICD-10-CM | POA: Insufficient documentation

## 2010-11-08 DIAGNOSIS — I10 Essential (primary) hypertension: Secondary | ICD-10-CM | POA: Insufficient documentation

## 2010-11-08 DIAGNOSIS — I2510 Atherosclerotic heart disease of native coronary artery without angina pectoris: Secondary | ICD-10-CM | POA: Insufficient documentation

## 2010-11-08 DIAGNOSIS — I509 Heart failure, unspecified: Secondary | ICD-10-CM | POA: Insufficient documentation

## 2010-11-08 LAB — URINALYSIS, ROUTINE W REFLEX MICROSCOPIC
Bilirubin Urine: NEGATIVE
Glucose, UA: >1000 mg/dL — AB
Hgb urine dipstick: NEGATIVE
Ketones, ur: NEGATIVE mg/dL
Leukocytes, UA: NEGATIVE
Nitrite: NEGATIVE
Protein, ur: NEGATIVE mg/dL
Specific Gravity, Urine: 1.015 (ref 1.005–1.030)
Urobilinogen, UA: 0.2 mg/dL (ref 0.0–1.0)
pH: 6 (ref 5.0–8.0)

## 2010-11-08 LAB — BASIC METABOLIC PANEL
BUN: 31 mg/dL — ABNORMAL HIGH (ref 6–23)
BUN: 30 mg/dL — ABNORMAL HIGH (ref 6–23)
CO2: 26 mEq/L (ref 19–32)
CO2: 30 mEq/L (ref 19–32)
Calcium: 9.9 mg/dL (ref 8.4–10.5)
Calcium: 10.2 mg/dL (ref 8.4–10.5)
Chloride: 93 mEq/L — ABNORMAL LOW (ref 96–112)
Chloride: 95 mEq/L — ABNORMAL LOW (ref 96–112)
Creatinine, Ser: 0.9 mg/dL (ref 0.4–1.5)
Creatinine, Ser: 0.99 mg/dL (ref 0.50–1.35)
GFR calc Af Amer: >60 mL/min (ref >60)
GFR calc non Af Amer: >60 mL/min (ref >60)
GFR: 86.38 mL/min (ref >60.00)
Glucose, Bld: 357 mg/dL — ABNORMAL HIGH (ref 70–99)
Glucose, Bld: 287 mg/dL — ABNORMAL HIGH (ref 70–99)
Potassium: 6.2 mEq/L (ref 3.5–5.1)
Potassium: 5.4 mEq/L — ABNORMAL HIGH (ref 3.5–5.1)
Sodium: 131 mEq/L — ABNORMAL LOW (ref 135–145)
Sodium: 134 mEq/L — ABNORMAL LOW (ref 135–145)

## 2010-11-08 LAB — COMPREHENSIVE METABOLIC PANEL
ALT: 43 U/L (ref 0–53)
AST: 39 U/L — ABNORMAL HIGH (ref 0–37)
Albumin: 3.5 g/dL (ref 3.5–5.2)
Alkaline Phosphatase: 178 U/L — ABNORMAL HIGH (ref 39–117)
BUN: 30 mg/dL — ABNORMAL HIGH (ref 6–23)
CO2: 29 mEq/L (ref 19–32)
Calcium: 10 mg/dL (ref 8.4–10.5)
Chloride: 97 mEq/L (ref 96–112)
Creatinine, Ser: 1.04 mg/dL (ref 0.50–1.35)
GFR calc Af Amer: >60 mL/min (ref >60)
GFR calc non Af Amer: >60 mL/min (ref >60)
Glucose, Bld: 305 mg/dL — ABNORMAL HIGH (ref 70–99)
Potassium: 6 mEq/L — ABNORMAL HIGH (ref 3.5–5.1)
Sodium: 133 mEq/L — ABNORMAL LOW (ref 135–145)
Total Bilirubin: 1.3 mg/dL — ABNORMAL HIGH (ref 0.3–1.2)
Total Protein: 7.8 g/dL (ref 6.0–8.3)

## 2010-11-08 LAB — DIFFERENTIAL
Basophils Absolute: 0 10*3/uL (ref 0.0–0.1)
Basophils Relative: 0 % (ref 0–1)
Eosinophils Absolute: 0.1 10*3/uL (ref 0.0–0.7)
Eosinophils Relative: 1 % (ref 0–5)
Lymphocytes Relative: 19 % (ref 12–46)
Lymphs Abs: 1.7 10*3/uL (ref 0.7–4.0)
Monocytes Absolute: 0.7 10*3/uL (ref 0.1–1.0)
Monocytes Relative: 8 % (ref 3–12)
Neutro Abs: 6.4 10*3/uL (ref 1.7–7.7)
Neutrophils Relative %: 72 % (ref 43–77)

## 2010-11-08 LAB — CBC
HCT: 48.3 % (ref 39.0–52.0)
Hemoglobin: 16 g/dL (ref 13.0–17.0)
MCH: 27.3 pg (ref 26.0–34.0)
MCHC: 33.1 g/dL (ref 30.0–36.0)
MCV: 82.3 fL (ref 78.0–100.0)
Platelets: 362 10*3/uL (ref 150–400)
RBC: 5.87 MIL/uL — ABNORMAL HIGH (ref 4.22–5.81)
RDW: 18.5 % — ABNORMAL HIGH (ref 11.5–15.5)
WBC: 8.9 10*3/uL (ref 4.0–10.5)

## 2010-11-08 LAB — GLUCOSE, CAPILLARY: Glucose-Capillary: 181 mg/dL — ABNORMAL HIGH (ref 70–99)

## 2010-11-08 LAB — URINE MICROSCOPIC-ADD ON

## 2010-11-08 NOTE — Progress Notes (Signed)
HPI:  Mr.  Gerald Rosario is in for follow up.  In general, he is doing better a lot better.  His swelling has resolved.  He saw Dr. Ezzard Standing and is going back.  There apparently is no growth visualized on his vocal chords, and he has paralysis of a chord.  His shortness of breath has improved.  He has been seen in follow up by a primary care MD, Dr. Duanne Guess, and that is a huge step forward for him.  He denies any chest pain.  Seems to be tolerating meds well.    Current Outpatient Prescriptions  Medication Sig Dispense Refill  . aspirin 81 MG tablet Take 81 mg by mouth daily.        . fluticasone-salmeterol (ADVAIR HFA) 115-21 MCG/ACT inhaler Inhale 2 puffs into the lungs 2 (two) times daily.        . furosemide (LASIX) 40 MG tablet Take 40 mg by mouth 2 (two) times daily.        Marland Kitchen lisinopril (PRINIVIL,ZESTRIL) 10 MG tablet Take 10 mg by mouth daily.        . metFORMIN (GLUCOPHAGE) 500 MG tablet Take 500 mg by mouth 2 (two) times daily with a meal.        . metoprolol tartrate (LOPRESSOR) 25 MG tablet Take 25 mg by mouth 2 (two) times daily.        . pantoprazole (PROTONIX) 40 MG tablet Take 40 mg by mouth daily.        . traMADol (ULTRAM) 50 MG tablet Take 50 mg by mouth every 6 (six) hours as needed.          Allergies  Allergen Reactions  . Sulfonamide Derivatives     Past Medical History  Diagnosis Date  . Diabetes mellitus   . Hypertension   . Asthma     start dulera 100 April 12,2011 > better but "knot in throat" so try qvar June 7,2011 > preferred dulera. HFA 90% May 10,2011 > 90% October 17,2011. PFT's June 7,2011 wnl x minimal nonspecific mid flow reduction while on dulera. Changed to advair intermediate strength October 17,2011 due to ins issue  . Heart failure   . MI (myocardial infarction)   . Hoarseness     onset 11/09. neg w/u 09/2008. C Newman    No past surgical history on file.  No family history on file.  History   Social History  . Marital Status: Single    Spouse Name:  N/A    Number of Children: N/A  . Years of Education: N/A   Occupational History  . Unemployed    Social History Main Topics  . Smoking status: Former Smoker -- 2.0 packs/day for 18 years    Types: Cigarettes    Quit date: 10/31/1980  . Smokeless tobacco: Not on file  . Alcohol Use: Yes     occasional  . Drug Use: No  . Sexually Active: Not on file   Other Topics Concern  . Not on file   Social History Narrative  . No narrative on file    ROS: Please see the HPI.  All other systems reviewed and negative.  PHYSICAL EXAM:  BP 122/78  Pulse 68  Resp 16  Ht 5\' 8"  (1.727 m)  Wt 140 lb (63.504 kg)  BMI 21.29 kg/m2  General: Well developed, well nourished, in no acute distress.  Dark skin color Head:  Normocephalic and atraumatic. Neck: no JVD Lungs: Clear to auscultation and percussion.  Decrease  BS bilaterally.   Heart: Normal S1 and S2.  No murmur, rubs or gallops.  Abdomen:  Normal bowel sounds; soft; non tender; no organomegaly.  Improved since admission Pulses: Pulses normal in all 4 extremities. Extremities: No clubbing or cyanosis. No edema. Neurologic: Alert and oriented x 3.  EKG:  NSR.  ASMI, old.  Inferior MI, old.  CXR:  July 18 CXr:   Comparison: 07/12/2010    Findings: Lungs are clear. No pleural effusion or pneumothorax.    Stable cardiomegaly.    Mild degenerative changes of the visualized thoracolumbar spine.    IMPRESSION:   Stable cardiomegaly.    No evidence of acute cardiopulmonary disease.     ASSESSMENT AND PLAN:

## 2010-11-08 NOTE — Telephone Encounter (Signed)
Patient's Potassium is 6.2 (not hemolyzed). Per Dr. Riley Kill, he would like for Mr. Heidelberg to stop taking his Lisinopril. He has been holding his Potassium x 2 days. He has also recommended that he report to the ER for a stat BMP. Patient is aware and will report to the ER now. The Triage Nurse in the ER is aware.

## 2010-11-08 NOTE — Assessment & Plan Note (Signed)
Currently no symptoms.  Plan medical therapy for now.  Symtomatically now class II/III.  Will need to consider ICD at appropriate timing based on LV.  Currently off of diuretics.  Continue meds and check BMET at this point in time.

## 2010-11-08 NOTE — Assessment & Plan Note (Signed)
Under the care of Dr. Duanne Guess.  Continue follow up with her.

## 2010-11-08 NOTE — Assessment & Plan Note (Signed)
BP is controlled at present.  Continue medical therapy.

## 2010-11-08 NOTE — Assessment & Plan Note (Signed)
ON medical therapy.  Not getting Coreg because of history of asthma, so need really a selective beta blocker to treat.

## 2010-11-17 ENCOUNTER — Telehealth: Payer: Self-pay | Admitting: Cardiology

## 2010-11-17 ENCOUNTER — Ambulatory Visit (INDEPENDENT_AMBULATORY_CARE_PROVIDER_SITE_OTHER): Payer: Medicare Other | Admitting: *Deleted

## 2010-11-17 DIAGNOSIS — I2510 Atherosclerotic heart disease of native coronary artery without angina pectoris: Secondary | ICD-10-CM

## 2010-11-17 DIAGNOSIS — I10 Essential (primary) hypertension: Secondary | ICD-10-CM

## 2010-11-17 LAB — BASIC METABOLIC PANEL
BUN: 16 mg/dL (ref 6–23)
CO2: 26 mEq/L (ref 19–32)
Calcium: 8.7 mg/dL (ref 8.4–10.5)
Chloride: 99 mEq/L (ref 96–112)
Creatinine, Ser: 0.9 mg/dL (ref 0.4–1.5)
GFR: 88.57 mL/min (ref >60.00)
Glucose, Bld: 145 mg/dL — ABNORMAL HIGH (ref 70–99)
Potassium: 4.4 mEq/L (ref 3.5–5.1)
Sodium: 132 mEq/L — ABNORMAL LOW (ref 135–145)

## 2010-11-17 NOTE — Cardiovascular Report (Signed)
NAMEQUENCY, TOBER                 ACCOUNT NO.:  [id number]  MEDICAL RECORD NO.:  [id number]  LOCATION:  4715                         FACILITY:  [facility]  PHYSICIAN:  Arturo Morton. Riley Kill, MD, FACCDATE OF BIRTH:  [birth year]  DATE OF PROCEDURE:  10/24/2010 DATE OF DISCHARGE:                           CARDIAC CATHETERIZATION   INDICATIONS:  Gerald Rosario is a 65 year old gentleman who presented with what appeared to be biventricular failure.  Echocardiography revealed reduction in left ventricular and right ventricular function.  He has been diuresing steadily and improving over the weekend.  He has previously presented with chest pain and positive enzymes several months earlier, and was not seen by Cardiology at that time.  They recommended admission and he checked out against medical advice.  Since that time, he has developed progressive shortness of breath with lower extremity edema.  Risks, benefits, and alternatives were discussed with the patient and he consented to proceed.  PROCEDURES: 1. Left and right heart catheterizations. 2. Selective coronary arteriography. 3. Selective left ventriculography.  DESCRIPTION OF PROCEDURE:  The procedure was performed from the right femoral artery and right femoral vein.  Smart needle was used for access.  A wire was required to get to the pulmonary artery, and we were not able to wedge the Swan-Ganz.  The left heart catheterization was performed with 5-French catheters.  Views of both coronary arteries and ventriculography were performed without complication.  The patient was subsequently taken to the holding area in satisfactory clinical condition for sheath removal.  I called his daughter and spoke with her at length about his clinical condition.  HEMODYNAMIC DATA: 1. Right atrial pressure 17. 2. RV 50/17. 3. Pulmonary artery 52/29, mean 37. 4. Pulmonary capillary wedge unable to wedge. 5. LV 128/25. 6. Aortic 139/76, mean 101. 7. No  gradient pullback across aortic valve. 8. Superior vena cava saturation 56%. 9. RA saturation 55%. 10.Aortic saturation 97%. 11.Fick cardiac output 3.5 L per minute. 12.Fick cardiac index 1.8 L per minute per meter squared. 13.Thermodilution cardiac output 2.9 L per minute. 14.Thermodilution cardiac index 1.5 L per minute per meter squared.  ANGIOGRAPHIC DATA: 1. Ventriculography was done in the RAO projection.  Overall systolic     function was severely reduced.  The anterobasal and mid inferior     wall segments moved.  The mid inferior wall segment appeared to be     in the distribution of long overlapping intermediate vessel     branches.  The mid distal anteroapical segments appeared to be     akinetic to dyskinetic.  The inferior wall distally was akinetic     and the proximal inferior wall was akinetic.  Overall ejection     fraction will be estimated in the 20%-25% range.  There was     moderate mitral regurgitation at minimum, and likely 3+. 2. The left main is free of critical disease. 3. The LAD has diffuse plaque proximally without critical narrowing.     It provides a large septal perforating vessel.  Just after this,     the LAD is occluded.  There is a diagonal that has diffuse plaque     and has about  an 80% stenosis.  The distal diagonal is modest in     size, with a typical diabetic appearance and trails off distally.     There are ghost vessels and what appeared to be a diagonal and     indefinitely in the LAD distribution. 4. The ramus intermedius is moderate in size.  There is 30%-40%     proximal narrowing in this ramus intermedius, but it does not     appear to be high grade.  It bifurcates distally and then another     branch bifurcates.  At least 2 other branches supply significant     inferior coverage with vessels that wrap around the anterolateral     wall and underneath, corresponding to the area preserved wall     motion in the RAO ventriculogram.  The  AV circumflex notably has     90% narrowing and is totally occluded, and this posterior vessel,     which represents a significant portion of the inferoposterior     segment is occluded and fills in by late post collaterals. 5. The right coronary artery is a fairly normal caliber up into the     first junction and then is severely diseased all the way down.  It     supplies a small distribution vasculature of less than 1 mm, the     entire segment appears to be severely diseased.  CONCLUSION: 1. Severe reduction in left ventricular systolic function, compatible     with severe ischemic cardiomyopathy. 2. Total occlusion of the left anterior descending artery with late     collaterals. 3. Total occlusion of the arteriovenous circumflex with late     collaterals. 4. Moderately high-grade disease of a segmentally diseased diagonal     branch. 5. Thread-like vessel with severe disease throughout the right     coronary artery. 6. Probable functional mitral regurgitation. 7. Pulmonary hypertension secondary to severe reduction in left     ventricular systolic function and probable functional mitral     regurgitation.  DISPOSITION:  The patient has a severe ischemic cardiomyopathy.  I doubt that there is any viability available in this territory.  We will likely recommend a cardiac MRI.  EP consult will be obtained.  He will eventually need an implantable defibrillator.  Management in the heart failure clinic will subsequently be recommended.     Arturo Morton. Riley Kill, MD, [facility]     TDS/MEDQ  D:  10/24/2010  T:  10/24/2010  Job:  [id number]  Electronically Signed by Shawnie Pons MD [facility] on 11/17/2010 09:48:26 AM

## 2010-11-17 NOTE — Telephone Encounter (Signed)
Order put in for bmet

## 2010-11-17 NOTE — H&P (Signed)
Gerald Rosario, Gerald Rosario                 ACCOUNT NO.:  [id number]  MEDICAL RECORD NO.:  [id number]  LOCATION:  4715                         FACILITY:  [facility]  PHYSICIAN:  Arturo Morton. Riley Kill, MD, FACCDATE OF BIRTH:  [birth year]  DATE OF ADMISSION:  10/19/2010 DATE OF DISCHARGE:                             HISTORY & PHYSICAL   PRIMARY CARDIOLOGIST:  [facility] being seen by Arturo Morton. Riley Kill, MD, [facility]  PRIMARY CARE:  The patient plans to followup with [facility].  PRIMARY PULMONOLOGIST:  Charlaine Dalton. Sherene Sires, MD, FCCP  PATIENT PROFILE:  A 65 year old male without prior cardiac history who presents with a 45-month history of progressive lower extremity edema and anasarca.  PROBLEMS: 1. Anasarca. 2. Hypertension. 3. Asthma. 4. Diabetes mellitus. 5. Medication nonadherence. 6. Hoarseness. 7. Back pain. 8. BPH. 9. Remote tobacco abuse 40 pack-year history, quitting in 1982. 10.History of less than 3-mm right mid lung nodules followed by     Pulmonology.  ALLERGIES:  SULFA.  HISTORY OF PRESENT ILLNESS:  A 64 year old male without prior cardiac history.  He has a longstanding history of asthma and previously hoarseness and has been followed by Dr. Sherene Sires.  At one point, he was even seen by ENT in 2009, who recommend treatment for GERD with resolution of hoarseness.  The patient was doing well earlier this year, but in about March or so he started to note increasing lower extremity edema along with recurrent hoarseness.  Edema has advanced and now involved his thighs, scrotum and abdomen.  He does have dyspnea on exertion, but denies chest pain, orthopnea, PND, nausea, vomiting, presyncope, syncope or early satiety.  He came off of/ran out of most of his medications in May of this year.  In the interim, his signs and symptoms have progressed.  He spoke to his daughter who is a Designer, jewellery in [place] by a phone yesterday and she was upset that he had mentioned  symptoms before and advised that he be evaluated.  For that reason, he presented to the ED today.  While walking into the ED, he stumbled and suffered an abrasion to his right knee, but has had no other injuries.  He did not experience presyncope, syncope prior to that fall.  We have been asked to eval.  HOME MEDICATIONS:  The patient has previously been prescribed metformin, Pravachol, lisinopril which she has run out off.  He is currently taking Advair b.i.d., Proventil p.r.n. and tramadol p.r.n.  FAMILY HISTORY:  Mother died of colon cancer at 37.  Father died of an MI at 29.  He has no siblings.  SOCIAL HISTORY:  The patient lives in [place] by himself.  He is retired Medical sales representative.  He has about 40 pack-year history of tobacco abuse quitting in 1982.  He denies alcohol or drug use.  He is not routinely exercising and has been very sedentary for the past 4 months.  REVIEW OF SYSTEMS:  Notable for dyspnea on exertion with left greater than right lower extremity edema and anasarca.  He has a fairly recent history diagnosis of diabetes mellitus, but has been off of his oral medication since approximately May.  He is  a full code.  Otherwise, all systems reviewed is negative.  PHYSICAL EXAMINATION:  VITAL SIGNS:  Temperature 97.7, heart rate 101, respirations 24, blood pressure 142/92, pulse ox 157 on 2 L. GENERAL:  Pleasant white male, in no acute distress.  Awake, alert and oriented x3.  He has a normal affect. HEENT:  Normal.  Nares grossly intact, nonfocal. SKIN:  Warm and dry without masses.  He does have a dressing to his right knee which is dry and intact. NECK:  Supple without bruits.  He has JVP to his jaw. LUNGS:  Respirations are unlabored.  Clear to auscultation. CARDIAC:  Regular S1 and S2 with a 2/6 systolic murmur at the left lower sternal border out to the apex. ABDOMEN:  Round, soft with pitting edema/anasarca to just below his nipple line.  Bowel  sounds present x4. EXTREMITIES:  Warm, dry and pink.  No clubbing or cyanosis, 2+ bilateral extremity edema to the waist.  Also, notable for scrotal edema.  Distal pulses 2+ bilaterally.  Chest x-ray shows stable cardiomegaly with no acute cardiopulmonary disease.  EKG shows sinus rhythm rate of 96.  He has left axis deviation with inferior infarct and poor R-wave progression.  Hemoglobin is 13.8, hematocrit is 41.1, WBC 4.9 and platelets 194.  Sodium 137, potassium 4.4, chloride 103, CO2 24, BUN 70, creatinine 0.84, glucose 106, AST 26, total protein 6.5, albumin 2.8, BNP 9032, troponin-I less than 0.30 and calcium 9.0.  ASSESSMENT AND PLAN: 1. Anasarca presumed right heart failure - question etiology.  Chest x-     ray is clear.  Admit and check echo as well as D-dimer with a low     threshold to perform.  CT angiography to rule out PE.  We will plan     to diurese.  Further plans following echocardiogram.  If EF is     down, we will plan right and left heart cardiac catheterization.     Otherwise, if there is evidence of pulmonary hypertension and right     heart failure, I would likely plan PE evaluation, right heart     catheterization and pulmonary evaluation. 2. Hoarseness present at least for a month.  He has previously been     seen by ENT for similar symptoms in the past.  This was treated     with PPI.  We will resume this. 3. Asthma/chronic obstructive pulmonary disease.  Continue Advair and     p.r.n. Proventil.  Resume PPI.  There is no wheezing on exam. 4. Hypertension.  Follow with diuresis.  He was previously on ACE     inhibitor which we would not resume with his pulmonary issues and     hoarseness. 5. Diabetes mellitus previously on metformin, but off for about 2     months.  Add sliding scale insulin.  Check     hemoglobin A1c. 6. Hyperlipidemia.  Check lipids and LFTs.  Consider resumption of     statin therapy.  The patient will require regular outpatient      followup and plans to obtain this at [facility].     Nicolasa Ducking, ANP   ______________________________ Arturo Morton. Riley Kill, MD, [facility]    CB/MEDQ  D:  10/19/2010  T:  10/20/2010  Job:  [id number]  Electronically Signed by Nicolasa Ducking ANP on 10/24/2010 10:42:22 AM Electronically Signed by Shawnie Pons MD [facility] on 11/17/2010 09:48:24 AM

## 2010-11-17 NOTE — Discharge Summary (Signed)
NAMEATHARVA, Gerald Rosario                 ACCOUNT NO.:  [id number]  MEDICAL RECORD NO.:  [id number]  LOCATION:  4715                         FACILITY:  [facility]  PHYSICIAN:  Gerald Morton. Gerald Kill, Rosario, FACCDATE OF BIRTH:  03-May-1945  DATE OF ADMISSION:  10/19/2010 DATE OF DISCHARGE:  10/26/2010                              DISCHARGE SUMMARY   PROCEDURES: 1. CT of the abdomen with contrast. 2. Two-view chest x-ray. 3. CT angiogram of the chest. 4. 2D echocardiogram. 5. Left heart catheterization. 6. Right heart catheterization. 7. Coronary arteriogram. 8. Left ventriculogram.  PRIMARY FINAL DISCHARGE DIAGNOSIS:  Acute on chronic systolic congestive heart failure.  SECONDARY DIAGNOSES: 1. Ischemic cardiomyopathy with an EF of 20-25% at cath, 30-35% at     echo. 2. Coronary artery disease with cardiac catheterization this     admission, showing left anterior descending coronary artery     totalled after the first diagonal which has an 80% stenosis,     circumflex totalled, right coronary artery 90% obtuse marginal 1     30%, and proximal left anterior descending coronary artery 40%,     medical therapy recommended at this time. 3. Anasarca. 4. Diabetes. 5. Hypertension. 6. Asthma. 7. History of medication noncompliance. 8. Hoarseness. 9. Back pain. 10.Benign prostatic hypertrophy. 11.Remote history of tobacco use. 12.History of small right mid lung pulmonary nodules followed by     Pulmonary.  ALLERGY:  SULFA.  FAMILY HISTORY:  Coronary artery disease in his father.  TIME AT DISCHARGE:  44 minutes.  HOSPITAL COURSE:  [name] is a 65 year old male with no previous documented coronary artery disease.  He came to the emergency room in April 2012, with syncope.  His CK-MB was elevated at that time.  The patient refused admission and left AMA.  He came to the hospital on October 19, 2010, with progressive lower extremity edema and anasarca.  His cardiac enzymes were negative.   Hemoglobin A1c was 7.3.  His blood sugars were managed closely.  The TSH was within normal limits.  A T3 was minimally low at 53.7, this can be followed as an outpatient. Cortisol was within normal limits.  He was diuresed with IV Lasix and his lower extremity edema improved.  A nutritionist saw him to help with diabetes and heart failure nutrition.  He was seen by care management and the patient got a referral to a primary care physician as well. Home health was requested and is ordered.  There was concern for ischemic versus nonischemic cardiomyopathy as his EF was 30-35% by echo. By October 24, 2010, his volume status had improved to the point that he was stable for right and left heart cath.  On the cardiac catheterization, he had severe native three-vessel disease and 3+ MR with an EF of 20-25%.  Dr. Riley Rosario was not sure that he had functional myocardium in the areas of infarction and recommended a functional MRI.  Dr. Riley Rosario reviewed the data with Dr. Ladona Rosario who recommended 90-day followup with a repeat echocardiogram and then an ICD if his EF is still down.  He is not having nonsustained VT or syncope at this time.  The LifeVest is not indicated.  He was having a problem with hoarseness and is to follow up with Dr. Narda Rosario for this.  By October 26, 2010, his weight was down from 84.8 kg to 72.9 kg.  His lower extremity edema had improved and his O2 saturation was 100% on room air. Dr. Riley Rosario evaluated Gerald Rosario and considered him stable for discharge, to follow up as an outpatient.  DISCHARGE INSTRUCTIONS: 1. His activity level is to be increased gradually. 2. He is to weigh and report his daily weights. 3. He is encouraged to stick to a low-sodium diabetic diet. 4. He is to get a BMET in 1 week which will be done by the home health     RN. 5. He is to follow up with Dr. Riley Rosario on November 07, 2010 at noon. 6. He is to keep his appointment with [facility] and Dr.      Allene Rosario office will be contacted for an appointment as well.  DISCHARGE MEDICATIONS: 1. Lopressor 25 mg b.i.d. 2. Lasix 40 mg b.i.d. 3. Tramadol 50 mg q.6 hours p.r.n. 4. Aspirin 81 mg a day. 5. Protonix 40 mg a day. 6. Advair b.i.d. 7. Metformin 500 mg b.i.d. 8. K-Dur 20 mEq a day. 9. Pravachol 40 mg daily. 10.Lisinopril 10 mg a day.     Gerald Demark, Rosario   ______________________________ Gerald Morton. Gerald Kill, Rosario, [facility]    RB/MEDQ  D:  10/26/2010  T:  10/26/2010  Job:  [id number]  cc:   Gerald Rosario, M.D.  Electronically Signed by Gerald Rosario on 11/03/2010 06:47:03 AM Electronically Signed by Gerald Rosario [facility] on 11/17/2010 09:48:18 AM

## 2010-11-17 NOTE — Discharge Summary (Signed)
  NAMERUBLE, PUMPHREY                 ACCOUNT NO.:  [id number]  MEDICAL RECORD NO.:  [id number]  LOCATION:  4715                         FACILITY:  [facility]  PHYSICIAN:  Arturo Morton. Riley Kill, MD, FACCDATE OF BIRTH:  12/17/1945  DATE OF ADMISSION:  10/19/2010 DATE OF DISCHARGE:  10/26/2010                              DISCHARGE SUMMARY   ADDENDUM: On the previous discharge summary to medications were inadvertently left off.  His medication list: 1. Metformin 500 mg p.o. b.i.d. 2. Potassium chloride 20 mEq daily.  All other discharge medications     and instructions are unchanged.     Theodore Demark, PA-C   ______________________________ Arturo Morton. Riley Kill, MD, [facility]    RB/MEDQ  D:  10/26/2010  T:  10/26/2010  Job:  [id number]  Electronically Signed by Theodore Demark PA-C on 11/03/2010 06:47:10 AM Electronically Signed by Shawnie Pons MD [facility] on 11/17/2010 09:48:22 AM

## 2010-11-18 ENCOUNTER — Telehealth: Payer: Self-pay | Admitting: Cardiology

## 2010-11-18 NOTE — Telephone Encounter (Signed)
Patient is aware to continue holding Lisinopril & Potassium until his f/u with Dr. Riley Kill on 12/09/10.

## 2010-11-18 NOTE — Telephone Encounter (Signed)
Pt returned your call from this am.

## 2010-12-09 ENCOUNTER — Encounter: Payer: Self-pay | Admitting: Cardiology

## 2010-12-09 ENCOUNTER — Ambulatory Visit (INDEPENDENT_AMBULATORY_CARE_PROVIDER_SITE_OTHER): Payer: Medicare Other | Admitting: Cardiology

## 2010-12-09 VITALS — BP 122/64 | HR 78 | Ht 68.0 in | Wt 147.4 lb

## 2010-12-09 DIAGNOSIS — I2510 Atherosclerotic heart disease of native coronary artery without angina pectoris: Secondary | ICD-10-CM

## 2010-12-09 DIAGNOSIS — I10 Essential (primary) hypertension: Secondary | ICD-10-CM

## 2010-12-09 DIAGNOSIS — I2589 Other forms of chronic ischemic heart disease: Secondary | ICD-10-CM

## 2010-12-09 DIAGNOSIS — E785 Hyperlipidemia, unspecified: Secondary | ICD-10-CM | POA: Insufficient documentation

## 2010-12-09 DIAGNOSIS — E7800 Pure hypercholesterolemia, unspecified: Secondary | ICD-10-CM | POA: Insufficient documentation

## 2010-12-09 LAB — LIPID PANEL
Cholesterol: 220 mg/dL — ABNORMAL HIGH (ref 0–200)
HDL: 72.9 mg/dL (ref >39.00)
Total CHOL/HDL Ratio: 3
Triglycerides: 102 mg/dL (ref 0.0–149.0)
VLDL: 20.4 mg/dL (ref 0.0–40.0)

## 2010-12-09 LAB — BASIC METABOLIC PANEL
BUN: 26 mg/dL — ABNORMAL HIGH (ref 6–23)
CO2: 27 mEq/L (ref 19–32)
Calcium: 9.4 mg/dL (ref 8.4–10.5)
Chloride: 102 mEq/L (ref 96–112)
Creatinine, Ser: 0.9 mg/dL (ref 0.4–1.5)
GFR: 94.52 mL/min (ref >60.00)
Glucose, Bld: 109 mg/dL — ABNORMAL HIGH (ref 70–99)
Potassium: 4.6 mEq/L (ref 3.5–5.1)
Sodium: 138 mEq/L (ref 135–145)

## 2010-12-09 LAB — HEPATIC FUNCTION PANEL
ALT: 31 U/L (ref 0–53)
AST: 38 U/L — ABNORMAL HIGH (ref 0–37)
Albumin: 3.9 g/dL (ref 3.5–5.2)
Alkaline Phosphatase: 97 U/L (ref 39–117)
Bilirubin, Direct: 0.1 mg/dL (ref 0.0–0.3)
Total Bilirubin: 0.6 mg/dL (ref 0.3–1.2)
Total Protein: 7.6 g/dL (ref 6.0–8.3)

## 2010-12-09 LAB — LDL CHOLESTEROL, DIRECT: Direct LDL: 132 mg/dL

## 2010-12-09 MED ORDER — ATORVASTATIN CALCIUM 20 MG PO TABS: 20.0000 mg | ORAL_TABLET | Freq: Every day | ORAL | Status: DC

## 2010-12-09 NOTE — Patient Instructions (Addendum)
Your physician recommends that you schedule a follow-up appointment in: 6 weeks with Dr. Riley Kill. Your physician recommends that you return for a FASTING lipid profile: today LIPID,LFT,BMET today and in 6 weeks. Lipid and LFT. Your physician has requested that you have an echocardiogram. Echocardiography is a painless test that uses sound waves to create images of your heart. It provides your doctor with information about the size and shape of your heart and how well your heart's chambers and valves are working. This procedure takes approximately one hour. There are no restrictions for this procedure. In 6 weeks.

## 2010-12-09 NOTE — Assessment & Plan Note (Signed)
Not currently on statin.  Will check levels, and then again in six weeks. Check liver profile.  Start atorvastatin.

## 2010-12-09 NOTE — Assessment & Plan Note (Signed)
Functional improved.  Class II.  Needs follow up echo in six weeks, and then decision regarding device therapy.

## 2010-12-09 NOTE — Assessment & Plan Note (Signed)
Severe by cath.  No current symptoms on medical therapy.

## 2010-12-09 NOTE — Assessment & Plan Note (Signed)
Better control 

## 2010-12-09 NOTE — Progress Notes (Signed)
HPI:  Doing much better.  Now in the gym.  Saw Dr. Ezzard Standing, and his voice has improved.  No chest pain.  Not currently on a statin. Had to stop ACE secondary to hyperkalemia.  Last K normal.  Not on K.  BP log has shown about 88-131 systolic, and all of his oxygen sats have been 100%.  Current Outpatient Prescriptions  Medication Sig Dispense Refill  . aspirin 81 MG tablet Take 81 mg by mouth daily.        . fish oil-omega-3 fatty acids 1000 MG capsule Take 1 g by mouth daily.        . fluticasone-salmeterol (ADVAIR HFA) 115-21 MCG/ACT inhaler Inhale 2 puffs into the lungs 2 (two) times daily.        . furosemide (LASIX) 40 MG tablet Take 40 mg by mouth 2 (two) times daily.        . metFORMIN (GLUCOPHAGE) 500 MG tablet Take 500 mg by mouth 2 (two) times daily with a meal.        . metoprolol tartrate (LOPRESSOR) 25 MG tablet Take 25 mg by mouth 2 (two) times daily.        . Multiple Vitamin (MULTIVITAMIN) tablet Take 1 tablet by mouth daily.        . pantoprazole (PROTONIX) 40 MG tablet Take 40 mg by mouth daily.        . traMADol (ULTRAM) 50 MG tablet Take 50 mg by mouth every 6 (six) hours as needed.          Allergies  Allergen Reactions  . Sulfonamide Derivatives     Past Medical History  Diagnosis Date  . Diabetes mellitus   . Hypertension   . Asthma     start dulera 100 April 12,2011 > better but "knot in throat" so try qvar June 7,2011 > preferred dulera. HFA 90% May 10,2011 > 90% October 17,2011. PFT's June 7,2011 wnl x minimal nonspecific mid flow reduction while on dulera. Changed to advair intermediate strength October 17,2011 due to ins issue  . Heart failure   . MI (myocardial infarction)   . Hoarseness     onset 11/09. neg w/u 09/2008. C Newman    No past surgical history on file.  No family history on file.  History   Social History  . Marital Status: Single    Spouse Name: N/A    Number of Children: N/A  . Years of Education: N/A   Occupational History  .  Unemployed    Social History Main Topics  . Smoking status: Former Smoker -- 2.0 packs/day for 18 years    Types: Cigarettes    Quit date: 10/31/1980  . Smokeless tobacco: Not on file  . Alcohol Use: Yes     occasional  . Drug Use: No  . Sexually Active: Not on file   Other Topics Concern  . Not on file   Social History Narrative  . No narrative on file    ROS: Please see the HPI.  All other systems reviewed and negative.  PHYSICAL EXAM:  BP 122/64  Pulse 78  Ht 5\' 8"  (1.727 m)  Wt 147 lb 6.4 oz (66.86 kg)  BMI 22.41 kg/m2  General: Well developed, well nourished, in no acute distress. Head:  Normocephalic and atraumatic. Neck: no JVD Lungs: Clear to auscultation and percussion. Heart: Normal S1 and S2.  S4 gallop.     Abdomen:  Normal bowel sounds; soft; non tender; no organomegaly Pulses: Pulses  normal in all 4 extremities. Extremities: No clubbing or cyanosis. No edema. Neurologic: Alert and oriented x 3.  EKG:  NSR. Inferior and anterior MI, age indeterminate.  PACs.  ASSESSMENT AND PLAN:

## 2010-12-12 ENCOUNTER — Telehealth: Payer: Self-pay | Admitting: *Deleted

## 2010-12-12 NOTE — Telephone Encounter (Signed)
Patient states Dr. Riley Kill order Lipitor 20 mg daily. The genetic Lipitor is still too expensive for him. The pharmacist suggested Simvastatin or Pravachol  Instead. I left a message to let pt. Know that MD IS ON VACATION. He will be in the office on Wednesday 12/14/10. We will ask MD that day, and we'll call him back then.

## 2010-12-14 NOTE — Discharge Summary (Signed)
  NAMEJAKOB, KIMBERLIN                 ACCOUNT NO.:  [id number]  MEDICAL RECORD NO.:  [id number]  LOCATION:  4715                         FACILITY:  [facility]  PHYSICIAN:  Arturo Morton. Riley Kill, MD, FACCDATE OF BIRTH:  December 25, 1945  DATE OF ADMISSION:  10/19/2010 DATE OF DISCHARGE:  10/26/2010                              DISCHARGE SUMMARY   ADDENDUM: On his discharge medication list Pravachol was listed, but the patient did not receive a prescription for this.  Please discontinue this medication and this issue can be addressed as an outpatient.     Theodore Demark, PA-C   ______________________________ Arturo Morton. Riley Kill, MD, [facility]    RB/MEDQ  D:  12/06/2010  T:  12/06/2010  Job:  [id number]  Electronically Signed by Theodore Demark PA-C on 12/08/2010 03:16:13 PM Electronically Signed by Shawnie Pons MD [facility] on 12/14/2010 06:09:12 PM

## 2010-12-28 ENCOUNTER — Telehealth: Payer: Self-pay | Admitting: Cardiology

## 2010-12-28 NOTE — Telephone Encounter (Signed)
Pt returning call from last week. Please call back.

## 2010-12-28 NOTE — Telephone Encounter (Signed)
Pt calling back again regarding, call from Friday.Pt said call was about pt cholesterol medicine. Please return pt call.

## 2010-12-28 NOTE — Telephone Encounter (Signed)
I spoke with the Gerald Rosario and made him aware that Dr Riley Kill would like him to take Pravastatin 40mg  daily.  At this time the Gerald Rosario does not need a Rx.  The Gerald Rosario is scheduled to see Dr Riley Kill in October and I will set up his follow-up labs from that appt. Gerald Rosario agreed with plan.

## 2011-01-20 ENCOUNTER — Ambulatory Visit (INDEPENDENT_AMBULATORY_CARE_PROVIDER_SITE_OTHER): Payer: Medicare Other | Admitting: Cardiology

## 2011-01-20 ENCOUNTER — Ambulatory Visit (HOSPITAL_COMMUNITY): Payer: Medicare Other | Attending: Cardiology | Admitting: Radiology

## 2011-01-20 ENCOUNTER — Other Ambulatory Visit: Payer: Medicare Other | Admitting: *Deleted

## 2011-01-20 ENCOUNTER — Encounter: Payer: Self-pay | Admitting: Cardiology

## 2011-01-20 DIAGNOSIS — I079 Rheumatic tricuspid valve disease, unspecified: Secondary | ICD-10-CM | POA: Insufficient documentation

## 2011-01-20 DIAGNOSIS — I2510 Atherosclerotic heart disease of native coronary artery without angina pectoris: Secondary | ICD-10-CM | POA: Insufficient documentation

## 2011-01-20 DIAGNOSIS — I059 Rheumatic mitral valve disease, unspecified: Secondary | ICD-10-CM | POA: Insufficient documentation

## 2011-01-20 DIAGNOSIS — I379 Nonrheumatic pulmonary valve disorder, unspecified: Secondary | ICD-10-CM | POA: Insufficient documentation

## 2011-01-20 DIAGNOSIS — I255 Ischemic cardiomyopathy: Secondary | ICD-10-CM

## 2011-01-20 DIAGNOSIS — E7800 Pure hypercholesterolemia, unspecified: Secondary | ICD-10-CM

## 2011-01-20 DIAGNOSIS — I2589 Other forms of chronic ischemic heart disease: Secondary | ICD-10-CM

## 2011-01-20 DIAGNOSIS — I252 Old myocardial infarction: Secondary | ICD-10-CM | POA: Insufficient documentation

## 2011-01-20 DIAGNOSIS — I10 Essential (primary) hypertension: Secondary | ICD-10-CM

## 2011-01-20 LAB — BASIC METABOLIC PANEL WITH GFR
BUN: 25 mg/dL — ABNORMAL HIGH (ref 6–23)
CO2: 28 meq/L (ref 19–32)
Calcium: 9.4 mg/dL (ref 8.4–10.5)
Chloride: 104 meq/L (ref 96–112)
Creatinine, Ser: 1.1 mg/dL (ref 0.4–1.5)
GFR: 70.39 mL/min
Glucose, Bld: 119 mg/dL — ABNORMAL HIGH (ref 70–99)
Potassium: 4.8 meq/L (ref 3.5–5.1)
Sodium: 141 meq/L (ref 135–145)

## 2011-01-20 MED ORDER — PRAVASTATIN SODIUM 40 MG PO TABS: 40.0000 mg | ORAL_TABLET | Freq: Every day | ORAL | Status: DC

## 2011-01-20 NOTE — Assessment & Plan Note (Signed)
Not an issue at present.

## 2011-01-20 NOTE — Progress Notes (Signed)
HPI:  He is doing well.  He is working out at [employer]  Denies any chest pain, except for one brief when he was lifting off of his elbow.  Otherwise he is ok.  He is remaining very active.  He and I reviewed his echo images and are awaiting what he is getting as EF.    Current Outpatient Prescriptions  Medication Sig Dispense Refill  . aspirin 81 MG tablet Take 81 mg by mouth daily.        . fish oil-omega-3 fatty acids 1000 MG capsule Take 1 g by mouth daily.        . fluticasone-salmeterol (ADVAIR HFA) 115-21 MCG/ACT inhaler Inhale 2 puffs into the lungs 2 (two) times daily.        . furosemide (LASIX) 40 MG tablet Take 40 mg by mouth 2 (two) times daily.        . metFORMIN (GLUCOPHAGE) 500 MG tablet Take 500 mg by mouth 2 (two) times daily with a meal.        . metoprolol tartrate (LOPRESSOR) 25 MG tablet Take 25 mg by mouth 2 (two) times daily.        . Multiple Vitamin (MULTIVITAMIN) tablet Take 1 tablet by mouth daily.        . pantoprazole (PROTONIX) 40 MG tablet Take 40 mg by mouth daily.        . pravastatin (PRAVACHOL) 40 MG tablet Take 1 tablet (40 mg total) by mouth daily.      . traMADol (ULTRAM) 50 MG tablet Take 50 mg by mouth every 6 (six) hours as needed.          Allergies  Allergen Reactions  . Sulfonamide Derivatives     Past Medical History  Diagnosis Date  . Diabetes mellitus   . Hypertension   . Asthma     start dulera 100 April 12,2011 > better but "knot in throat" so try qvar June 7,2011 > preferred dulera. HFA 90% May 10,2011 > 90% October 17,2011. PFT's June 7,2011 wnl x minimal nonspecific mid flow reduction while on dulera. Changed to advair intermediate strength October 17,2011 due to ins issue  . Heart failure   . MI (myocardial infarction)   . Hoarseness     onset 11/09. neg w/u 09/2008. C Newman    No past surgical history on file.  No family history on file.  History   Social History  . Marital Status: Single    Spouse Name: N/A    Number of  Children: N/A  . Years of Education: N/A   Occupational History  . Unemployed    Social History Main Topics  . Smoking status: Former Smoker -- 2.0 packs/day for 18 years    Types: Cigarettes    Quit date: 10/31/1980  . Smokeless tobacco: Not on file  . Alcohol Use: Yes     occasional  . Drug Use: No  . Sexually Active: Not on file   Other Topics Concern  . Not on file   Social History Narrative  . No narrative on file    ROS: Please see the HPI.  All other systems reviewed and negative.  PHYSICAL EXAM:  BP 104/56  Pulse 60  Ht 5\' 6"  (1.676 m)  Wt 156 lb (70.761 kg)  BMI 25.18 kg/m2  General: Well developed, well nourished, in no acute distress.  Somewhat darker skin Head:  Normocephalic and atraumatic. Neck: no JVD Lungs:  Decrease BS bilaterally Heart: Normal S1  and S2.  Prominent S4. Abdomen:  Normal bowel sounds; soft; non tender; no organomegaly Pulses: Pulses normal in all 4 extremities. Extremities: No clubbing or cyanosis. No edema. Neurologic: Alert and oriented x 3.  EKG:  Echo July 2012  Study Conclusions    - Left ventricle: LVEF is approximately 30 to 35% with severe     hypokinesis of the septum; akinesis of the mid/distal inferior,     distal lateral, distal anterior and apiccal walls. The cavity size     was normal. Wall thickness was normal.   - Mitral valve: Moderate regurgitation.   - Left atrium: The atrium was moderately dilated.   - Right atrium: The atrium was moderately dilated.   - Pulmonary arteries: PA peak pressure: 68mm Hg (S).   ASSESSMENT AND PLAN:

## 2011-01-20 NOTE — Assessment & Plan Note (Signed)
Will need to recheck and see if he is controlled.  If not, simva probably next best option for him.  Lipitor was too expensive.

## 2011-01-20 NOTE — Assessment & Plan Note (Signed)
Had one episode of chest pain, but when he was rising off his elbow--sounded MS in origin.

## 2011-01-20 NOTE — Patient Instructions (Signed)
Your physician recommends that you return for a FASTING lipid and liver profile on 02/08/11.    Your physician recommends that you have lab work today: [facility]  Your physician recommends that you continue on your current medications as directed. Please refer to the Current Medication list given to you today.  Your physician recommends that you schedule a follow-up appointment in: 2 MONTHS

## 2011-01-20 NOTE — Assessment & Plan Note (Signed)
Developed significant hyperkalemia on an ACE, so not a good candidate for ACE or ARB.  Tolerating Beta blockade, but BP is borderline on this therapy.  Functional Status is now class II.  Await results of 2D echocardiogram.

## 2011-02-08 ENCOUNTER — Other Ambulatory Visit (INDEPENDENT_AMBULATORY_CARE_PROVIDER_SITE_OTHER): Payer: Medicare Other | Admitting: *Deleted

## 2011-02-08 DIAGNOSIS — E7800 Pure hypercholesterolemia, unspecified: Secondary | ICD-10-CM

## 2011-02-08 LAB — HEPATIC FUNCTION PANEL
ALT: 25 U/L (ref 0–53)
AST: 35 U/L (ref 0–37)
Albumin: 3.8 g/dL (ref 3.5–5.2)
Alkaline Phosphatase: 105 U/L (ref 39–117)
Bilirubin, Direct: 0 mg/dL (ref 0.0–0.3)
Total Bilirubin: 0.8 mg/dL (ref 0.3–1.2)
Total Protein: 7.4 g/dL (ref 6.0–8.3)

## 2011-02-08 LAB — LIPID PANEL
Cholesterol: 137 mg/dL (ref 0–200)
HDL: 56.3 mg/dL (ref >39.00)
LDL Cholesterol: 64 mg/dL (ref 0–99)
Total CHOL/HDL Ratio: 2
Triglycerides: 83 mg/dL (ref 0.0–149.0)
VLDL: 16.6 mg/dL (ref 0.0–40.0)

## 2011-02-17 ENCOUNTER — Telehealth: Payer: Self-pay | Admitting: Cardiology

## 2011-02-17 NOTE — Telephone Encounter (Signed)
Pt returning your call-please call back

## 2011-02-17 NOTE — Telephone Encounter (Signed)
I spoke with the pt and reviewed his ECHO and lab results.

## 2011-03-21 ENCOUNTER — Encounter: Payer: Self-pay | Admitting: Cardiology

## 2011-03-21 ENCOUNTER — Encounter (HOSPITAL_COMMUNITY): Payer: Self-pay | Admitting: *Deleted

## 2011-03-21 ENCOUNTER — Ambulatory Visit (INDEPENDENT_AMBULATORY_CARE_PROVIDER_SITE_OTHER): Payer: Medicare Other | Admitting: Cardiology

## 2011-03-21 ENCOUNTER — Other Ambulatory Visit: Payer: Self-pay

## 2011-03-21 ENCOUNTER — Emergency Department (HOSPITAL_COMMUNITY)
Admission: EM | Admit: 2011-03-21 | Discharge: 2011-03-21 | Disposition: A | Discharge status: Home / Self Care | Payer: Medicare Other | Attending: Emergency Medicine | Admitting: Emergency Medicine

## 2011-03-21 VITALS — BP 110/68 | HR 76 | Ht 66.5 in | Wt 158.1 lb

## 2011-03-21 DIAGNOSIS — I255 Ischemic cardiomyopathy: Secondary | ICD-10-CM

## 2011-03-21 DIAGNOSIS — Z7982 Long term (current) use of aspirin: Secondary | ICD-10-CM | POA: Insufficient documentation

## 2011-03-21 DIAGNOSIS — E119 Type 2 diabetes mellitus without complications: Secondary | ICD-10-CM | POA: Insufficient documentation

## 2011-03-21 DIAGNOSIS — E7800 Pure hypercholesterolemia, unspecified: Secondary | ICD-10-CM

## 2011-03-21 DIAGNOSIS — E875 Hyperkalemia: Secondary | ICD-10-CM

## 2011-03-21 DIAGNOSIS — J45909 Unspecified asthma, uncomplicated: Secondary | ICD-10-CM | POA: Insufficient documentation

## 2011-03-21 DIAGNOSIS — I2589 Other forms of chronic ischemic heart disease: Secondary | ICD-10-CM

## 2011-03-21 DIAGNOSIS — I10 Essential (primary) hypertension: Secondary | ICD-10-CM

## 2011-03-21 DIAGNOSIS — Z794 Long term (current) use of insulin: Secondary | ICD-10-CM | POA: Insufficient documentation

## 2011-03-21 DIAGNOSIS — Z79899 Other long term (current) drug therapy: Secondary | ICD-10-CM | POA: Insufficient documentation

## 2011-03-21 DIAGNOSIS — I252 Old myocardial infarction: Secondary | ICD-10-CM | POA: Insufficient documentation

## 2011-03-21 LAB — COMPREHENSIVE METABOLIC PANEL
ALT: 28 U/L (ref 0–53)
AST: 33 U/L (ref 0–37)
Albumin: 3.9 g/dL (ref 3.5–5.2)
Alkaline Phosphatase: 92 U/L (ref 39–117)
BUN: 24 mg/dL — ABNORMAL HIGH (ref 6–23)
CO2: 26 mEq/L (ref 19–32)
Calcium: 10.2 mg/dL (ref 8.4–10.5)
Chloride: 102 mEq/L (ref 96–112)
Creatinine, Ser: 1.24 mg/dL (ref 0.50–1.35)
GFR calc Af Amer: 68 mL/min — ABNORMAL LOW (ref >90)
GFR calc non Af Amer: 59 mL/min — ABNORMAL LOW (ref >90)
Glucose, Bld: 70 mg/dL (ref 70–99)
Potassium: 5.3 mEq/L — ABNORMAL HIGH (ref 3.5–5.1)
Sodium: 139 mEq/L (ref 135–145)
Total Bilirubin: 0.4 mg/dL (ref 0.3–1.2)
Total Protein: 7.9 g/dL (ref 6.0–8.3)

## 2011-03-21 LAB — BASIC METABOLIC PANEL
BUN: 25 mg/dL — ABNORMAL HIGH (ref 6–23)
CO2: 27 mEq/L (ref 19–32)
Calcium: 10.1 mg/dL (ref 8.4–10.5)
Chloride: 106 mEq/L (ref 96–112)
Creatinine, Ser: 1.3 mg/dL (ref 0.4–1.5)
GFR: 59.68 mL/min — ABNORMAL LOW (ref >60.00)
Glucose, Bld: 96 mg/dL (ref 70–99)
Potassium: 6.4 mEq/L (ref 3.5–5.1)
Sodium: 141 mEq/L (ref 135–145)

## 2011-03-21 MED ORDER — SODIUM POLYSTYRENE SULFONATE 15 GM/60ML PO SUSP
15.0000 g | Freq: Once | ORAL | Status: AC
  Administered 2011-03-21: 15 g via ORAL
  Filled 2011-03-21: qty 60

## 2011-03-21 NOTE — Patient Instructions (Signed)
Your physician recommends that you have lab work today: [facility]  Your physician recommends that you schedule a follow-up appointment in: 2 MONTHS with Dr Fredrik Cove have been referred to an Electrophysiologist for ICD consideration.

## 2011-03-21 NOTE — Progress Notes (Signed)
HPI:  He is doing well.  We had a long discussion today regarding his treatment course and current status, and discussion regarding ICD placement.  He denies any ongoing chest pain. He currently is not taking a ACE/ARB because of some hyperkalemia.    Current Outpatient Prescriptions  Medication Sig Dispense Refill  . aspirin 81 MG tablet Take 81 mg by mouth daily.        . fish oil-omega-3 fatty acids 1000 MG capsule Take 1 g by mouth daily.        . fluticasone-salmeterol (ADVAIR HFA) 115-21 MCG/ACT inhaler Inhale 2 puffs into the lungs 2 (two) times daily.        . furosemide (LASIX) 40 MG tablet Take 40 mg by mouth 2 (two) times daily.        Marland Kitchen LANTUS SOLOSTAR 100 UNIT/ML injection Inject 10 Units into the skin at bedtime.       . metFORMIN (GLUCOPHAGE) 500 MG tablet Take 1,000 mg by mouth 2 (two) times daily with a meal.       . metoprolol tartrate (LOPRESSOR) 25 MG tablet Take 25 mg by mouth 2 (two) times daily.        . Multiple Vitamin (MULTIVITAMIN) tablet Take 1 tablet by mouth daily.        . pantoprazole (PROTONIX) 40 MG tablet Take 40 mg by mouth daily.        . pravastatin (PRAVACHOL) 40 MG tablet Take 1 tablet (40 mg total) by mouth daily.  90 tablet  3  . traMADol (ULTRAM) 50 MG tablet Take 50 mg by mouth every 6 (six) hours as needed.          Allergies  Allergen Reactions  . Sulfonamide Derivatives     Past Medical History  Diagnosis Date  . Diabetes mellitus   . Hypertension   . Asthma     start dulera 100 April 12,2011 > better but "knot in throat" so try qvar June 7,2011 > preferred dulera. HFA 90% May 10,2011 > 90% October 17,2011. PFT's June 7,2011 wnl x minimal nonspecific mid flow reduction while on dulera. Changed to advair intermediate strength October 17,2011 due to ins issue  . Heart failure   . MI (myocardial infarction)   . Hoarseness     onset 11/09. neg w/u 09/2008. C Newman    No past surgical history on file.  No family history on  file.  History   Social History  . Marital Status: Single    Spouse Name: N/A    Number of Children: N/A  . Years of Education: N/A   Occupational History  . Unemployed    Social History Main Topics  . Smoking status: Former Smoker -- 2.0 packs/day for 18 years    Types: Cigarettes    Quit date: 10/31/1980  . Smokeless tobacco: Not on file  . Alcohol Use: Yes     occasional  . Drug Use: No  . Sexually Active: Not on file   Other Topics Concern  . Not on file   Social History Narrative  . No narrative on file    ROS: Please see the HPI.  All other systems reviewed and negative.  PHYSICAL EXAM:  BP 110/68  Pulse 76  Ht 5' 6.5" (1.689 m)  Wt 71.723 kg (158 lb 1.9 oz)  BMI 25.14 kg/m2  General: Well developed, well nourished, in no acute distress.  Darker skin color Head:  Normocephalic and atraumatic. Neck: no JVD Lungs:  Decrease BS bilaterally consistent with COPD/asthma Heart: Normal S1 and S2.  S4 gallop.  No S3 at present.  Abdomen:  Normal bowel sounds; soft; non tender; no organomegaly Pulses: Pulses normal in all 4 extremities. Extremities: No clubbing or cyanosis. No edema. Neurologic: Alert and oriented x 3.  EKG:  ECHO:  (01/20/11)  Study Conclusions  - Left ventricle: The cavity size was normal. Wall thickness was increased in a pattern of mild LVH. Systolic function was moderately to severely reduced. The estimated ejection fraction was in the range of 30% to 35%. Akinesis of the distalanterior, lateral, inferolateral, and apical myocardium. Hypokinesis of the entireinferior myocardium. Doppler parameters are consistent with abnormal left ventricular relaxation (grade 1 diastolic dysfunction). - Mitral valve: Mildly calcified annulus. Mild regurgitation. - Left atrium: The atrium was moderately dilated. - Right ventricle: Systolic function was mildly reduced. - Atrial septum: There was an atrial septal aneurysm.     ASSESSMENT AND  PLAN:

## 2011-03-21 NOTE — ED Provider Notes (Signed)
History     CSN: [id number] Arrival date & time: 03/21/2011  6:28 PM   First MD Initiated Contact with Patient 03/21/11 2014      Chief Complaint  Patient presents with  . Abnormal Lab    abnormal labs    (Consider location/radiation/quality/duration/timing/severity/associated sxs/prior treatment) The history is provided by the patient.  He was seen at Dr. Rosalyn Charters office this morning for routine lab studies and was contacted this afternoon alerting him to a high potassium and recommending that he come to the emergency room for further evaluation. He reports he was told his potassium level was 6.5. He denies symptoms of any kind and feels well. He also states that he has a history of high potassium which required previous hospitalization.  Past Medical History  Diagnosis Date  . Diabetes mellitus   . Hypertension   . Asthma     start dulera 100 April 12,2011 > better but "knot in throat" so try qvar June 7,2011 > preferred dulera. HFA 90% May 10,2011 > 90% October 17,2011. PFT's June 7,2011 wnl x minimal nonspecific mid flow reduction while on dulera. Changed to advair intermediate strength October 17,2011 due to ins issue  . Heart failure   . MI (myocardial infarction)   . Hoarseness     onset 11/09. neg w/u 09/2008. C Newman    History reviewed. No pertinent past surgical history.  No family history on file.  History  Substance Use Topics  . Smoking status: Former Smoker -- 2.0 packs/day for 18 years    Types: Cigarettes    Quit date: 10/31/1980  . Smokeless tobacco: Not on file  . Alcohol Use: Yes     occasional      Review of Systems  Constitutional: Negative for fever and chills.  HENT: Negative.   Respiratory: Negative.   Cardiovascular: Negative.   Gastrointestinal: Negative.   Musculoskeletal: Negative.   Skin: Negative.   Neurological: Negative.     Allergies  Sulfa antibiotics  Home Medications   Current Outpatient Rx  Name Route Sig Dispense  Refill  . ASPIRIN 81 MG PO TABS Oral Take 81 mg by mouth daily.      . OMEGA-3 FATTY ACIDS 1000 MG PO CAPS Oral Take 1 g by mouth daily.      Marland Kitchen FLUTICASONE-SALMETEROL 115-21 MCG/ACT IN AERO Inhalation Inhale 2 puffs into the lungs 2 (two) times daily.      . FUROSEMIDE 40 MG PO TABS Oral Take 40 mg by mouth 2 (two) times daily.      Marland Kitchen LANTUS SOLOSTAR 100 UNIT/ML [place] SOLN Subcutaneous Inject 10 Units into the skin at bedtime.     Marland Kitchen METFORMIN HCL 500 MG PO TABS Oral Take 1,000 mg by mouth 2 (two) times daily with a meal.     . METOPROLOL TARTRATE 25 MG PO TABS Oral Take 25 mg by mouth 2 (two) times daily.      Marland Kitchen ONE-DAILY MULTI VITAMINS PO TABS Oral Take 1 tablet by mouth daily.      Marland Kitchen PANTOPRAZOLE SODIUM 40 MG PO TBEC Oral Take 40 mg by mouth daily.      Marland Kitchen PRAVASTATIN SODIUM 40 MG PO TABS Oral Take 1 tablet (40 mg total) by mouth daily. 90 tablet 3  . TRAMADOL HCL 50 MG PO TABS Oral Take 50 mg by mouth every 6 (six) hours as needed. For pain      BP 137/60  Pulse 108  Temp(Src) 97.7 F (36.5 C) (Oral)  Resp 21  SpO2 100%  Physical Exam  Constitutional: He appears well-developed and well-nourished.  HENT:  Head: Normocephalic.  Neck: Normal range of motion. Neck supple.  Cardiovascular: Normal rate and regular rhythm.   Pulmonary/Chest: Effort normal and breath sounds normal.  Abdominal: Soft. Bowel sounds are normal. There is no tenderness. There is no rebound and no guarding.  Musculoskeletal: Normal range of motion.  Neurological: He is alert. No cranial nerve deficit.  Skin: Skin is warm and dry. No rash noted.  Psychiatric: He has a normal mood and affect.    ED Course  Procedures (including critical care time)  Labs Reviewed  COMPREHENSIVE METABOLIC PANEL - Abnormal; Notable for the following:    Potassium 5.3 (*)    BUN 24 (*)    GFR calc non Af Amer 59 (*)    GFR calc Af Amer 68 (*)    All other components within normal limits   No results found.   No diagnosis  found.    MDM  EKG does is not consistent with high potassium and shows only PVC's. He is asymptomatic. Potassium checked here is 5.3. Patient given a dose of kayexolate and feel he can be discharged to follow up with Dr. Riley Kill tomorrow.        Rodena Medin, PA 03/21/11 2113

## 2011-03-21 NOTE — ED Notes (Signed)
Patient reports his md office called with potassium level of 6.5.  He denies any sx.  He was told to come to ED for repeat labs.

## 2011-03-21 NOTE — ED Notes (Signed)
Patient denies pain and is resting comfortably.  

## 2011-03-21 NOTE — ED Notes (Signed)
Vital signs stable. 

## 2011-03-29 NOTE — ED Provider Notes (Signed)
Medical screening examination/treatment/procedure(s) were performed by non-physician practitioner and as supervising physician I was immediately available for consultation/collaboration.   Suzi Roots, MD 03/29/11 [id number]

## 2011-04-02 DIAGNOSIS — E875 Hyperkalemia: Secondary | ICD-10-CM | POA: Insufficient documentation

## 2011-04-02 NOTE — Assessment & Plan Note (Signed)
Will recheck.  No longer on ACE/ARB, or K replacement.  Recheck.

## 2011-04-02 NOTE — Assessment & Plan Note (Signed)
Under the care of his primary MD.  Currently on metformin.

## 2011-04-02 NOTE — Assessment & Plan Note (Signed)
Recheck in six months

## 2011-04-02 NOTE — Assessment & Plan Note (Signed)
He really is doing well.  We had to withdraw ARB/ACE because of hyperkalemia, and we are using lower dose metop because of asthma and COPD.  But he is participating in exercise, and is doing well from a functional standpoint.  We are watching his labs.  He should be considered from prophylactic ICD----his EF is preserved, he does not have progressive clinical CHF, and our medication options are boxed in to some extent.  I have discussed this with him in detail, and he was reluctant at first, but now is willing to consider.  Referral to EP.

## 2011-04-02 NOTE — Assessment & Plan Note (Signed)
BP nicely controlled.

## 2011-04-06 ENCOUNTER — Telehealth: Payer: Self-pay | Admitting: Internal Medicine

## 2011-04-06 NOTE — Telephone Encounter (Signed)
I spoke with the pt and he wanted to know if Dr Riley Kill checked his cholesterol in December. I made the pt aware that only a BMP was checked.

## 2011-04-06 NOTE — Telephone Encounter (Signed)
New msg Pt is calling about lab results please call him back

## 2011-04-10 ENCOUNTER — Ambulatory Visit (INDEPENDENT_AMBULATORY_CARE_PROVIDER_SITE_OTHER): Payer: Medicare Other | Admitting: Internal Medicine

## 2011-04-10 ENCOUNTER — Encounter: Payer: Self-pay | Admitting: Internal Medicine

## 2011-04-10 VITALS — BP 131/78 | HR 66 | Ht 66.0 in | Wt 165.0 lb

## 2011-04-10 DIAGNOSIS — I2589 Other forms of chronic ischemic heart disease: Secondary | ICD-10-CM

## 2011-04-10 DIAGNOSIS — I255 Ischemic cardiomyopathy: Secondary | ICD-10-CM

## 2011-04-10 DIAGNOSIS — I5022 Chronic systolic (congestive) heart failure: Secondary | ICD-10-CM | POA: Insufficient documentation

## 2011-04-10 MED ORDER — HYDRALAZINE HCL 10 MG PO TABS: 10.0000 mg | ORAL_TABLET | Freq: Three times a day (TID) | ORAL | Status: DC

## 2011-04-10 MED ORDER — ISOSORBIDE MONONITRATE ER 30 MG PO TB24: 30.0000 mg | ORAL_TABLET | Freq: Every day | ORAL | Status: DC

## 2011-04-10 NOTE — Assessment & Plan Note (Signed)
The patient has a persistent ischemic cardiomyopathy. 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 He is potentially a candidate however for hydralazine nitrates and this may have the benefit of improving left ventricular function as well and I discussed this with both him and Dr. Riley Kill and they're agreeable to pursue that therapy.  In the event that the left ventricular function does not improve after a three-month trial, I would recommend an ICD.  However, is not IVC. The patient has had 2 episodes of presyncope associated with exertion. These were gradual in onset with ultimate collapse. The concern is whether these were exercise associated arrhythmias and whether this substrate is responsible or whether the ischemia was responsible. To that end we'll undertake EPS to look to see if he has persistent substrate leaving him at risk for cardiac arrest. In the event that he has a inducible ventricular tachycardia, who implanted ICD. In the event that were negative, we will reassess his ejection fraction as noted above.  I reviewed with him the potential risks and benefits of elliptical study as well as a defibrillator implantation including but not limited to death perforation inappropriate shocks and dislodgment. He understands these risks and is willing to proceed.

## 2011-04-10 NOTE — Patient Instructions (Addendum)
Your physician has recommended that you have an EP Study. This test is used to assess serious arrhythmias (irregular heartbeats). During an Electro-physiology Study (EPS), a thin, flexible wire is passed through a vein in your groin (upper thigh) or neck up to the heart. The wire records the heart's electrical signals. Your doctor uses the wire to electrically stimulate your heart and trigger an arrhythmic. This allows the doctor to see whether an antiarrhythmia medicine can help manage the problem or if further procedures are necessary (i.e., ablation/ICD). Radiofrequency ablation, a procedure used to fix some types of arrthythmia, may be done during an EPS. This is done in the hospital and often requires an overnight stay. Please see the instruction sheet given to your today for more information.   Your physician has recommended that you have a defibrillator inserted. An implantable cardioverter defibrillator (ICD) is a small device that is placed in your chest or, in rare cases, your abdomen. This device uses electrical pulses or shocks to help control life-threatening, irregular heartbeats that could lead the heart to suddenly stop beating (sudden cardiac arrest). Leads are attached to the ICD that goes into your heart. This is done in the hospital and usually requires an overnight stay. Please see the instruction sheet given to you today for more information.   START HYDRALAZINE 10 MG ONE TABLET THREE TIMES DAILY  START ISOSORBIDE 30 MG ONCE DAILY

## 2011-04-10 NOTE — Assessment & Plan Note (Signed)
As above. We will add hydralazine nitrates. He has noted some weight gain. But he is euvolemic. We will not adjust his diuretics. We will check a metabolic profile today.

## 2011-04-10 NOTE — Progress Notes (Signed)
CARDIOLOGY CONSULT NOTE  Patient ID: Gerald Rosario, MRN: [id number], DOB/AGE: 67/16/1947 67 y.o. Admit date: (Not on file) Date of Consult: 04/10/2011  Primary Physician: Maryelizabeth Rowan, MD, MD Primary Cardiologist: stuckey  Chief Complaint: ICD   HPI:  Gerald Rosario is a 66 year old gentleman referred for consideration of an ICD.  He presented to the hospital in April. At that time he had congestive heart failure and underwent a 20 pound diuresis. Catheterization demonstrated non-revascularizable coronary artery disease and previous  MI (see details below). MRI was recommended for myocardial viability but was not consummated. He also had significant biventricular failure for which underwent diuresis with significant improvement.  Recent ejection fraction by echo remained in depressed at 30-35%. The patient has been reluctant to receive an ICD. He is not on ACE inhibitors because of problems with hypokalemia.  He denies orthopnea nocturnal dyspnea. He does have some peripheral edema.  Perhaps most alarmingly has had 2 episodes of exertionally associated syncope. These are unassociated with palpitations. They were progressive over meds of exertion resulted ultimately in collapse.  Past Medical History  Diagnosis Date  . Diabetes mellitus   . Hypertension   . Asthma     start dulera 100 April 12,2011 > better but "knot in throat" so try qvar June 7,2011 > preferred dulera. HFA 90% May 10,2011 > 90% October 17,2011. PFT's June 7,2011 wnl x minimal nonspecific mid flow reduction while on dulera. Changed to advair intermediate strength October 17,2011 due to ins issue  . Heart failure   . Ischemic cardiomyopathy      Total occlusion   left anterior descending /AV Circ /Thread like RCA EF 30-35%Echo 2012  . Hoarseness     onset 11/09. neg w/u 09/2008. C Newman  . presyncope       Surgical History: No past surgical history on file.   Home Meds: Prior to Admission medications   Medication  Sig Start Date End Date Taking? Authorizing Provider  aspirin 81 MG tablet Take 81 mg by mouth daily.     Yes Historical Provider, MD  fish oil-omega-3 fatty acids 1000 MG capsule Take 1 g by mouth daily.     Yes Historical Provider, MD  fluticasone-salmeterol (ADVAIR HFA) 115-21 MCG/ACT inhaler Inhale 2 puffs into the lungs 2 (two) times daily.     Yes Historical Provider, MD  furosemide (LASIX) 40 MG tablet Take 40 mg by mouth 2 (two) times daily.     Yes Historical Provider, MD  LANTUS SOLOSTAR 100 UNIT/ML injection Inject 10 Units into the skin at bedtime.  02/06/11  Yes Historical Provider, MD  metFORMIN (GLUCOPHAGE) 500 MG tablet Take 1,000 mg by mouth 2 (two) times daily with a meal.    Yes Historical Provider, MD  metoprolol tartrate (LOPRESSOR) 25 MG tablet Take 25 mg by mouth 2 (two) times daily.     Yes Historical Provider, MD  Multiple Vitamin (MULTIVITAMIN) tablet Take 1 tablet by mouth daily.     Yes Historical Provider, MD  pantoprazole (PROTONIX) 40 MG tablet Take 40 mg by mouth daily.     Yes Historical Provider, MD  pravastatin (PRAVACHOL) 40 MG tablet Take 1 tablet (40 mg total) by mouth daily. 01/20/11  Yes Shawnie Pons, MD  traMADol (ULTRAM) 50 MG tablet Take 50 mg by mouth every 6 (six) hours as needed. For pain   Yes Historical Provider, MD    Allergies:  Allergies  Allergen Reactions  . Sulfa Antibiotics Swelling    History  Social History  . Marital Status: Single    Spouse Name: N/A    Number of Children: N/A  . Years of Education: N/A   Occupational History  . Unemployed    Social History Main Topics  . Smoking status: Former Smoker -- 2.0 packs/day for 18 years    Types: Cigarettes    Quit date: 10/31/1980  . Smokeless tobacco: Not on file  . Alcohol Use: Yes     occasional  . Drug Use: No  . Sexually Active: Not on file   Other Topics Concern  . Not on file   Social History Narrative  . No narrative on file    Review of Systems:  General:  negative for chills, fever, night sweats or weight changes.  Cardiovascular: negative for chest pain, edema, orthopnea, palpitations, paroxysmal nocturnal dyspnea, shortness of breath or dyspnea on exertion Dermatological: negative for rash Respiratory: negative for cough or wheezing Urologic: negative for hematuria Abdominal: negative for nausea, vomiting, diarrhea, bright red blood per rectum, melena, or hematemesis Neurologic: negative for visual changes, syncope, or dizziness All other systems reviewed and are otherwise negative except as noted above.          Physical Exam: Blood pressure 131/78, pulse 66, height 5\' 6"  (1.676 m), weight 165 lb (74.844 kg). General: Well developed, well nourished, in no acute distress. Head: Normocephalic, atraumatic, sclera non-icteric, no xanthomas, nares are without discharge. Lymph Nodes:  none Neck: Negative for carotid bruits. JVD not elevated. Lungs: Clear bilaterally to auscultation without wheezes, rales, or rhonchi. Breathing is unlabored. Heart: RRR with S1 S2. No murmurs, rubs, or gallops appreciated. Abdomen: Soft, non-tender, non-distended with normoactive bowel sounds. No hepatomegaly. No rebound/guarding. No obvious abdominal masses. Msk:  Strength and tone appear normal for age. Extremities: No clubbing or cyanosis. No edema.  Distal pedal pulses are 2+ and equal bilaterally. Skin: Warm and Dry Neuro: Alert and oriented X 3. CN III-XII intact Grossly normal sensory and motor function . Psych:  Responds to questions appropriately with a normal affect.      ECG dated 18 December demonstrates sinus rhythm with PACs intervals 0.14/0.09/0.40 poor R-wave progression

## 2011-04-18 ENCOUNTER — Other Ambulatory Visit: Payer: Self-pay | Admitting: Internal Medicine

## 2011-04-18 DIAGNOSIS — R55 Syncope and collapse: Secondary | ICD-10-CM

## 2011-04-19 ENCOUNTER — Telehealth: Payer: Self-pay | Admitting: Internal Medicine

## 2011-04-19 ENCOUNTER — Encounter (HOSPITAL_COMMUNITY): Payer: Self-pay | Admitting: Pharmacy Technician

## 2011-04-19 DIAGNOSIS — I255 Ischemic cardiomyopathy: Secondary | ICD-10-CM

## 2011-04-19 NOTE — Telephone Encounter (Signed)
New Msg: pt calling wanting to speak with nurse in regards to pt procedure he is suppose to have on 05/03/11. Please return pt call to discuss further.

## 2011-04-20 ENCOUNTER — Encounter: Payer: Self-pay | Admitting: *Deleted

## 2011-04-20 NOTE — Telephone Encounter (Signed)
I spoke with the patient. He will come for labwork on 04/26/11. I have discussed his instructions with him for his procedure.

## 2011-04-20 NOTE — Telephone Encounter (Signed)
Addended by: Sherri Rad C on: 04/20/2011 04:10 PM   Modules accepted: Orders

## 2011-04-26 ENCOUNTER — Other Ambulatory Visit (INDEPENDENT_AMBULATORY_CARE_PROVIDER_SITE_OTHER): Payer: Medicare Other | Admitting: *Deleted

## 2011-04-26 DIAGNOSIS — I255 Ischemic cardiomyopathy: Secondary | ICD-10-CM

## 2011-04-26 DIAGNOSIS — I2589 Other forms of chronic ischemic heart disease: Secondary | ICD-10-CM

## 2011-04-26 LAB — CBC WITH DIFFERENTIAL/PLATELET
Basophils Absolute: 0 10*3/uL (ref 0.0–0.1)
Basophils Relative: 0.4 % (ref 0.0–3.0)
Eosinophils Absolute: 0.3 10*3/uL (ref 0.0–0.7)
Eosinophils Relative: 4.8 % (ref 0.0–5.0)
HCT: 36.6 % — ABNORMAL LOW (ref 39.0–52.0)
Hemoglobin: 12.3 g/dL — ABNORMAL LOW (ref 13.0–17.0)
Lymphocytes Relative: 33.3 % (ref 12.0–46.0)
Lymphs Abs: 2 10*3/uL (ref 0.7–4.0)
MCHC: 33.7 g/dL (ref 30.0–36.0)
MCV: 93.1 fl (ref 78.0–100.0)
Monocytes Absolute: 0.5 10*3/uL (ref 0.1–1.0)
Monocytes Relative: 8.6 % (ref 3.0–12.0)
Neutro Abs: 3.2 10*3/uL (ref 1.4–7.7)
Neutrophils Relative %: 52.9 % (ref 43.0–77.0)
Platelets: 294 10*3/uL (ref 150.0–400.0)
RBC: 3.93 Mil/uL — ABNORMAL LOW (ref 4.22–5.81)
RDW: 13.4 % (ref 11.5–14.6)
WBC: 6 10*3/uL (ref 4.5–10.5)

## 2011-04-26 LAB — BASIC METABOLIC PANEL
BUN: 23 mg/dL (ref 6–23)
CO2: 27 mEq/L (ref 19–32)
Calcium: 10.1 mg/dL (ref 8.4–10.5)
Chloride: 108 mEq/L (ref 96–112)
Creatinine, Ser: 1.3 mg/dL (ref 0.4–1.5)
GFR: 58.09 mL/min — ABNORMAL LOW (ref >60.00)
Glucose, Bld: 114 mg/dL — ABNORMAL HIGH (ref 70–99)
Potassium: 4.9 mEq/L (ref 3.5–5.1)
Sodium: 148 mEq/L — ABNORMAL HIGH (ref 135–145)

## 2011-04-26 LAB — PROTIME-INR
INR: 1 ratio (ref 0.8–1.0)
Prothrombin Time: 10.5 s (ref 10.2–12.4)

## 2011-05-03 ENCOUNTER — Ambulatory Visit (HOSPITAL_COMMUNITY)
Admission: RE | Admit: 2011-05-03 | Discharge: 2011-05-03 | Disposition: A | Discharge status: Home / Self Care | Payer: Medicare Other | Source: Ambulatory Visit | Attending: Internal Medicine | Admitting: Internal Medicine

## 2011-05-03 ENCOUNTER — Encounter (HOSPITAL_COMMUNITY): Payer: Self-pay

## 2011-05-03 ENCOUNTER — Encounter (HOSPITAL_COMMUNITY)
Admission: RE | Disposition: A | Discharge status: Home / Self Care | Payer: Self-pay | Source: Ambulatory Visit | Attending: Internal Medicine

## 2011-05-03 ENCOUNTER — Ambulatory Visit (HOSPITAL_COMMUNITY): Admit: 2011-05-03 | Payer: Medicare Other | Admitting: Internal Medicine

## 2011-05-03 DIAGNOSIS — J45909 Unspecified asthma, uncomplicated: Secondary | ICD-10-CM | POA: Insufficient documentation

## 2011-05-03 DIAGNOSIS — I252 Old myocardial infarction: Secondary | ICD-10-CM | POA: Insufficient documentation

## 2011-05-03 DIAGNOSIS — I509 Heart failure, unspecified: Secondary | ICD-10-CM | POA: Insufficient documentation

## 2011-05-03 DIAGNOSIS — Z794 Long term (current) use of insulin: Secondary | ICD-10-CM | POA: Insufficient documentation

## 2011-05-03 DIAGNOSIS — E119 Type 2 diabetes mellitus without complications: Secondary | ICD-10-CM | POA: Insufficient documentation

## 2011-05-03 DIAGNOSIS — I2510 Atherosclerotic heart disease of native coronary artery without angina pectoris: Secondary | ICD-10-CM | POA: Insufficient documentation

## 2011-05-03 DIAGNOSIS — Z7982 Long term (current) use of aspirin: Secondary | ICD-10-CM | POA: Insufficient documentation

## 2011-05-03 DIAGNOSIS — I2589 Other forms of chronic ischemic heart disease: Secondary | ICD-10-CM | POA: Insufficient documentation

## 2011-05-03 DIAGNOSIS — I5030 Unspecified diastolic (congestive) heart failure: Secondary | ICD-10-CM | POA: Insufficient documentation

## 2011-05-03 DIAGNOSIS — R55 Syncope and collapse: Secondary | ICD-10-CM

## 2011-05-03 DIAGNOSIS — I10 Essential (primary) hypertension: Secondary | ICD-10-CM | POA: Insufficient documentation

## 2011-05-03 DIAGNOSIS — Z79899 Other long term (current) drug therapy: Secondary | ICD-10-CM | POA: Insufficient documentation

## 2011-05-03 HISTORY — PX: IMPLANTABLE CARDIOVERTER DEFIBRILLATOR GENERATOR CHANGE: SHX5474

## 2011-05-03 HISTORY — PX: ELECTROPHYSIOLOGY STUDY: SHX5467

## 2011-05-03 LAB — POCT I-STAT 3, VENOUS BLOOD GAS (G3P V)
Bicarbonate: 25.4 mEq/L — ABNORMAL HIGH (ref 20.0–24.0)
O2 Saturation: 59 %
TCO2: 27 mmol/L (ref 0–100)
pCO2, Ven: 42.1 mmHg — ABNORMAL LOW (ref 45.0–50.0)
pH, Ven: 7.388 — ABNORMAL HIGH (ref 7.250–7.300)
pO2, Ven: 31 mmHg (ref 30.0–45.0)

## 2011-05-03 LAB — SURGICAL PCR SCREEN
MRSA, PCR: NEGATIVE
Staphylococcus aureus: POSITIVE — AB

## 2011-05-03 LAB — GLUCOSE, CAPILLARY: Glucose-Capillary: 95 mg/dL (ref 70–99)

## 2011-05-03 SURGERY — ELECTROPHYSIOLOGY STUDY
Anesthesia: LOCAL

## 2011-05-03 MED ORDER — MIDAZOLAM HCL 5 MG/5ML IJ SOLN
INTRAMUSCULAR | Status: AC
  Filled 2011-05-03: qty 5

## 2011-05-03 MED ORDER — GENTAMICIN SULFATE 40 MG/ML IJ SOLN
80.0000 mg | INTRAMUSCULAR | Status: DC
  Filled 2011-05-03 (×2): qty 2

## 2011-05-03 MED ORDER — CHLORHEXIDINE GLUCONATE 4 % EX LIQD: 60.0000 mL | Freq: Once | CUTANEOUS | Status: DC

## 2011-05-03 MED ORDER — MUPIROCIN 2 % EX OINT
TOPICAL_OINTMENT | Freq: Once | CUTANEOUS | Status: AC
  Administered 2011-05-03: 1 via NASAL

## 2011-05-03 MED ORDER — HEPARIN (PORCINE) IN NACL 2-0.9 UNIT/ML-% IJ SOLN
INTRAMUSCULAR | Status: AC
  Filled 2011-05-03: qty 1000

## 2011-05-03 MED ORDER — SODIUM CHLORIDE 0.45 % IV SOLN: INTRAVENOUS | Status: DC

## 2011-05-03 MED ORDER — CEFAZOLIN SODIUM-DEXTROSE 2-3 GM-% IV SOLR
2.0000 g | INTRAVENOUS | Status: DC
  Filled 2011-05-03: qty 50

## 2011-05-03 MED ORDER — MUPIROCIN 2 % EX OINT
TOPICAL_OINTMENT | CUTANEOUS | Status: AC
  Filled 2011-05-03: qty 22

## 2011-05-03 MED ORDER — SODIUM CHLORIDE 0.9 % IV SOLN
INTRAVENOUS | Status: DC
  Administered 2011-05-03: 07:00:00 via INTRAVENOUS

## 2011-05-03 MED ORDER — DIAZEPAM 5 MG PO TABS
10.0000 mg | ORAL_TABLET | ORAL | Status: AC
  Administered 2011-05-03: 10 mg via ORAL
  Filled 2011-05-03: qty 2

## 2011-05-03 MED ORDER — BUPIVACAINE HCL (PF) 0.25 % IJ SOLN
INTRAMUSCULAR | Status: AC
  Filled 2011-05-03: qty 30

## 2011-05-03 MED ORDER — FENTANYL CITRATE 0.05 MG/ML IJ SOLN
INTRAMUSCULAR | Status: AC
  Filled 2011-05-03: qty 2

## 2011-05-03 NOTE — H&P (View-Only) (Signed)
 CARDIOLOGY CONSULT NOTE  Patient ID: Gerald Rosario, MRN: [id number], DOB/AGE: 12/09/1945 65 y.o. Admit date: (Not on file) Date of Consult: 04/10/2011  Primary Physician: DEWEY,ELIZABETH, MD, MD Primary Cardiologist: stuckey  Chief Complaint: ICD   HPI:  Gerald Rosario is a 65-year-old gentleman referred for consideration of an ICD.  He presented to the hospital in April. At that time he had congestive heart failure and underwent a 20 pound diuresis. Catheterization demonstrated non-revascularizable coronary artery disease and previous  MI (see details below). MRI was recommended for myocardial viability but was not consummated. He also had significant biventricular failure for which underwent diuresis with significant improvement.  Recent ejection fraction by echo remained in depressed at 30-35%. The patient has been reluctant to receive an ICD. He is not on ACE inhibitors because of problems with hypokalemia.  He denies orthopnea nocturnal dyspnea. He does have some peripheral edema.  Perhaps most alarmingly has had 2 episodes of exertionally associated syncope. These are unassociated with palpitations. They were progressive over meds of exertion resulted ultimately in collapse.  Past Medical History  Diagnosis Date  . Diabetes mellitus   . Hypertension   . Asthma     start dulera 100 April 12,2011 > better but "knot in throat" so try qvar June 7,2011 > preferred dulera. HFA 90% May 10,2011 > 90% October 17,2011. PFT's June 7,2011 wnl x minimal nonspecific mid flow reduction while on dulera. Changed to advair intermediate strength October 17,2011 due to ins issue  . Heart failure   . Ischemic cardiomyopathy      Total occlusion   left anterior descending /AV Circ /Thread like RCA EF 30-35%Echo 2012  . Hoarseness     onset 11/09. neg w/u 09/2008. C Newman  . presyncope       Surgical History: No past surgical history on file.   Home Meds: Prior to Admission medications   Medication  Sig Start Date End Date Taking? Authorizing Provider  aspirin 81 MG tablet Take 81 mg by mouth daily.     Yes Historical Provider, MD  fish oil-omega-3 fatty acids 1000 MG capsule Take 1 g by mouth daily.     Yes Historical Provider, MD  fluticasone-salmeterol (ADVAIR HFA) 115-21 MCG/ACT inhaler Inhale 2 puffs into the lungs 2 (two) times daily.     Yes Historical Provider, MD  furosemide (LASIX) 40 MG tablet Take 40 mg by mouth 2 (two) times daily.     Yes Historical Provider, MD  LANTUS SOLOSTAR 100 UNIT/ML injection Inject 10 Units into the skin at bedtime.  02/06/11  Yes Historical Provider, MD  metFORMIN (GLUCOPHAGE) 500 MG tablet Take 1,000 mg by mouth 2 (two) times daily with a meal.    Yes Historical Provider, MD  metoprolol tartrate (LOPRESSOR) 25 MG tablet Take 25 mg by mouth 2 (two) times daily.     Yes Historical Provider, MD  Multiple Vitamin (MULTIVITAMIN) tablet Take 1 tablet by mouth daily.     Yes Historical Provider, MD  pantoprazole (PROTONIX) 40 MG tablet Take 40 mg by mouth daily.     Yes Historical Provider, MD  pravastatin (PRAVACHOL) 40 MG tablet Take 1 tablet (40 mg total) by mouth daily. 01/20/11  Yes Thomas Stuckey, MD  traMADol (ULTRAM) 50 MG tablet Take 50 mg by mouth every 6 (six) hours as needed. For pain   Yes Historical Provider, MD    Allergies:  Allergies  Allergen Reactions  . Sulfa Antibiotics Swelling    History     Social History  . Marital Status: Single    Spouse Name: N/A    Number of Children: N/A  . Years of Education: N/A   Occupational History  . Unemployed    Social History Main Topics  . Smoking status: Former Smoker -- 2.0 packs/day for 18 years    Types: Cigarettes    Quit date: 10/31/1980  . Smokeless tobacco: Not on file  . Alcohol Use: Yes     occasional  . Drug Use: No  . Sexually Active: Not on file   Other Topics Concern  . Not on file   Social History Narrative  . No narrative on file    Review of Systems:  General:  negative for chills, fever, night sweats or weight changes.  Cardiovascular: negative for chest pain, edema, orthopnea, palpitations, paroxysmal nocturnal dyspnea, shortness of breath or dyspnea on exertion Dermatological: negative for rash Respiratory: negative for cough or wheezing Urologic: negative for hematuria Abdominal: negative for nausea, vomiting, diarrhea, bright red blood per rectum, melena, or hematemesis Neurologic: negative for visual changes, syncope, or dizziness All other systems reviewed and are otherwise negative except as noted above.          Physical Exam: Blood pressure 131/78, pulse 66, height 5' 6" (1.676 m), weight 165 lb (74.844 kg). General: Well developed, well nourished, in no acute distress. Head: Normocephalic, atraumatic, sclera non-icteric, no xanthomas, nares are without discharge. Lymph Nodes:  none Neck: Negative for carotid bruits. JVD not elevated. Lungs: Clear bilaterally to auscultation without wheezes, rales, or rhonchi. Breathing is unlabored. Heart: RRR with S1 S2. No murmurs, rubs, or gallops appreciated. Abdomen: Soft, non-tender, non-distended with normoactive bowel sounds. No hepatomegaly. No rebound/guarding. No obvious abdominal masses. Msk:  Strength and tone appear normal for age. Extremities: No clubbing or cyanosis. No edema.  Distal pedal pulses are 2+ and equal bilaterally. Skin: Warm and Dry Neuro: Alert and oriented X 3. CN III-XII intact Grossly normal sensory and motor function . Psych:  Responds to questions appropriately with a normal affect.      ECG dated 18 December demonstrates sinus rhythm with PACs intervals 0.14/0.09/0.40 poor R-wave progression  

## 2011-05-03 NOTE — Brief Op Note (Signed)
05/03/2011  10:57 AM  PATIENT:  Gerald Rosario  66 y.o. male  PRE-OPERATIVE DIAGNOSIS:  acm  POST-OPERATIVE DIAGNOSIS:  * No post-op diagnosis entered *  PROCEDURE:  Procedure(s): ELECTROPHYSIOLOGY STUDY IMPLANTABLE CARDIOVERTER DEFIBRILLATOR GENERATOR CHANGE  SURGEON:  Surgeon(s): Duke Salvia, MD    ANESTHESIA:   local and IV sedation   DICTATION: .Other Dictation: Dictation Number [id number]

## 2011-05-03 NOTE — Op Note (Signed)
Gerald Rosario, SAGAR NO.:  [id number]  MEDICAL RECORD NO.:  [id number]  LOCATION:  [facility]                         FACILITY:  [facility]  PHYSICIAN:  Duke Salvia, MD, FACCDATE OF BIRTH:  1946/02/01  DATE OF PROCEDURE:  05/03/2011 DATE OF DISCHARGE:                              OPERATIVE REPORT   PREOPERATIVE DIAGNOSIS:  Syncope.  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  PROCEDURE:  Invasive electrophysiological study, right heart catheterization, and venography.  Following obtaining informed consent, the patient was brought to the electrophysiology laboratory and placed on the fluoroscopic table in supine position.  After routine prep and drape, cardiac catheterization was performed with local anesthesia and conscious sedation.  Noninvasive blood pressure monitoring were performed continuously throughout the procedure.  Following the procedure, the catheters were removed. Hemostasis was obtained.  The patient was transferred to the floor in stable condition.  CATHETERS:  A 5-French quadripolar catheter was inserted via left femoral vein to the AV junction.  Measured His electrogram and subsequently did the right atrium.  A 5-French quadripolar catheter was inserted via left femoral vein to the right ventricular apex to the right ventricular outflow tract.  Right heart catheterization was performed with the Swan-Ganz catheter.  Following catheter insertions, a stimulation protocol included: 1. Incremental atrial pacing. 2. Incremental ventricular pacing. 3. Single and double and triple ventricular extrastimuli at the right     ventricular apex at paced cycle length of 600 and 400 msec. 4. Single and double ventricular extrastimuli from the right     ventricular apex at 400:600 msec. 5. Double and triple extrastimuli from the right ventricular outflow     tract at 400 msec.  END-TIDAL  RESULTS:  End-Tidal surface electrocardiogram. Rhythm:  Sinus; RR interval about 850 msec; PR interval 102 msec; QRS duration 108 msec; QTc interval 440 msec. AH interval 65 msec; HV interval:  30 msec.  End-tidal AV nodal function:  AV Wenckebach was 500 msec.  VA conduction was one-to-one at 600 msec and was dissociated 400 msec.  No evidence of dual AV nodal physiology was identified.  Accessory pathway function:  No evidence of accessory pathway was identified.  The effective refractory period at the right ventricular apex at a pace cycle length of 600 msec was 280 msec, at 400 msec was 250 msec.  The CCI from the RVA was 600:290:210:210 and at 400 msec was: 250:200:200.  The effective refractory period at the right ventricular outflow tract at a pace cycle of 600 msec was 300 msec and at 400 msec was 280 msec.  The CCI 400 msec from the RVOT was 270:230:220.  Arrhythmias induced:  None.  It was noted that the pressure through the sheath was about 20 or 25 cm. It was elected to do a right heart catheterization.  This was accomplished with some difficulty on passing the catheter between the iliac vein and in the IVC.  We undertook a somewhat futile effort at venography at this location seeking to clarify the nature of the narrowing.  We could see that was clearly narrowed right at that junction.  We could not visualize  whether it was an external or internal process.  I will review this with Radiology.  We will plan to reassess his left ventricular function in a number of months following up titration of his afterload reducing agents.     Duke Salvia, MD, [facility]     SCK/MEDQ  D:  05/03/2011  T:  05/03/2011  Job:  [id number]

## 2011-05-03 NOTE — Interval H&P Note (Signed)
History and Physical Interval Note:  05/03/2011 8:07 AM  Gerald Rosario  has presented today for surgery, with the diagnosis of acm  The various methods of treatment have been discussed with the patient and family. After consideration of risks, benefits and other options for treatment, the patient has consented to  Procedure(s): ELECTROPHYSIOLOGY STUDY IMPLANTABLE CARDIOVERTER DEFIBRILLATOR GENERATOR CHANGE as a surgical intervention .  The patients' history has been reviewed, patient examined, no change in status, stable for surgery.  I have reviewed the patients' chart and labs.  Questions were answered to the patient's satisfaction.     Sherryl Manges

## 2011-05-04 LAB — GLUCOSE, CAPILLARY: Glucose-Capillary: 108 mg/dL — ABNORMAL HIGH (ref 70–99)

## 2011-05-16 ENCOUNTER — Telehealth: Payer: Self-pay | Admitting: *Deleted

## 2011-05-16 DIAGNOSIS — I878 Other specified disorders of veins: Secondary | ICD-10-CM

## 2011-05-16 DIAGNOSIS — I745 Embolism and thrombosis of iliac artery: Secondary | ICD-10-CM

## 2011-05-16 DIAGNOSIS — I871 Compression of vein: Secondary | ICD-10-CM

## 2011-05-16 NOTE — Telephone Encounter (Signed)
Pt has Medicare and [employer] supplement.  No precert required.

## 2011-05-16 NOTE — Telephone Encounter (Signed)
Per Dr Graciela Husbands, he spoke with interventional radiology who recommended a CT scan with venous phase imaging to visualize an anomaly seen at EPS-  "It was noted that the pressure through the sheath was about 20 or 25 cm. It was elected to do a right heart catheterization. This was accomplished with some difficulty on passing the catheter between the iliac vein and in the IVC. We undertook a somewhat futile effort at venography at this location seeking to clarify the nature of the narrowing. We could see that was clearly narrowed right at that junction. We could not visualize whether it was an external or internal  process. I will review this with Radiology"  Spoke with patient who understands need for and is willing to have CT scan done.  He is aware that we will precert test and then Rose will be calling from CT to schedule.  He has been made aware that he needs to be NPO for 4 hours prior to test and to hold Metformin morning of procedure.   Pt has Medicare and [employer].

## 2011-05-18 ENCOUNTER — Ambulatory Visit (INDEPENDENT_AMBULATORY_CARE_PROVIDER_SITE_OTHER)
Admission: RE | Admit: 2011-05-18 | Discharge: 2011-05-18 | Disposition: A | Discharge status: Home / Self Care | Payer: Medicare Other | Source: Ambulatory Visit | Attending: Internal Medicine | Admitting: Internal Medicine

## 2011-05-18 DIAGNOSIS — I745 Embolism and thrombosis of iliac artery: Secondary | ICD-10-CM

## 2011-05-18 DIAGNOSIS — I871 Compression of vein: Secondary | ICD-10-CM

## 2011-05-18 MED ORDER — IOHEXOL 300 MG/ML  SOLN
100.0000 mL | Freq: Once | INTRAMUSCULAR | Status: AC | PRN
  Administered 2011-05-18: 100 mL via INTRAVENOUS

## 2011-05-24 ENCOUNTER — Encounter: Payer: Self-pay | Admitting: Cardiology

## 2011-05-24 ENCOUNTER — Ambulatory Visit (INDEPENDENT_AMBULATORY_CARE_PROVIDER_SITE_OTHER): Payer: Medicare Other | Admitting: Cardiology

## 2011-05-24 DIAGNOSIS — N139 Obstructive and reflux uropathy, unspecified: Secondary | ICD-10-CM | POA: Insufficient documentation

## 2011-05-24 DIAGNOSIS — I2510 Atherosclerotic heart disease of native coronary artery without angina pectoris: Secondary | ICD-10-CM

## 2011-05-24 DIAGNOSIS — I255 Ischemic cardiomyopathy: Secondary | ICD-10-CM

## 2011-05-24 DIAGNOSIS — N429 Disorder of prostate, unspecified: Secondary | ICD-10-CM

## 2011-05-24 DIAGNOSIS — I2589 Other forms of chronic ischemic heart disease: Secondary | ICD-10-CM

## 2011-05-24 DIAGNOSIS — E7800 Pure hypercholesterolemia, unspecified: Secondary | ICD-10-CM

## 2011-05-24 DIAGNOSIS — E119 Type 2 diabetes mellitus without complications: Secondary | ICD-10-CM

## 2011-05-24 DIAGNOSIS — I5022 Chronic systolic (congestive) heart failure: Secondary | ICD-10-CM

## 2011-05-24 LAB — BASIC METABOLIC PANEL
BUN: 22 mg/dL (ref 6–23)
CO2: 29 mEq/L (ref 19–32)
Calcium: 10.2 mg/dL (ref 8.4–10.5)
Chloride: 106 mEq/L (ref 96–112)
Creatinine, Ser: 1.3 mg/dL (ref 0.4–1.5)
GFR: 60.19 mL/min (ref >60.00)
Glucose, Bld: 88 mg/dL (ref 70–99)
Potassium: 5.1 mEq/L (ref 3.5–5.1)
Sodium: 145 mEq/L (ref 135–145)

## 2011-05-24 NOTE — Assessment & Plan Note (Signed)
Possible that renal issues related to this.  He is not all that symptomatic, but says he does have stream issues, and has had "prostate" issues for years.  Will refer him to [facility] for further evaluation.  This may require need for treatment.  Recheck BMET as well.

## 2011-05-24 NOTE — Assessment & Plan Note (Signed)
He currently has no ischemic symptoms.  Medical therapy has been recommended.  He has seen EP and EP study done and negative.  More aggressive medical therapy for CAD has been recommended.  He is now on nitrates and hydralazine.  Therefore, continue same.

## 2011-05-24 NOTE — Progress Notes (Signed)
HPI:  [name] returns in follow up.  He was seen by Dr. Graciela Rosario and underwent EP study.  This was negative and no defib device was placed.  He was placed on a combination of nitrates and hydralazine for his ischemic cardiomyopathy.  Dr. Graciela Rosario attempted to do a RHC, but could not pass a swan, so a CT was ordered to rule out venous obstruction.  The major findings of this visit are as noted:  Impression:  1. Diminished caliber of the IVC and iliac veins without specific features to suggest venous thrombosis. 2. Marked distention of the urinary bladder which has some mild mass effect upon the common iliac veins.  Original Report Authenticated By: Gerald Rosario, M.D.       Last Resulted: 05/18/11 9:37 AM   He notes that he has had prostate trouble for years but has not seen anyone in [place].  He has gained some weight and wanted me to give him additional diuretics to help lose more, but I explained to him that his weight gain was not fluid related, and that is easily noted on his scan as well.  Currently he is stable.  Denies any pain.  We reviewed his history of syncope or presyncope, and these events are remote.  The last certainly sounded positional in nature.  No current chest pain.  He says he does feel his heart, but absolutely denies that this entails any chest discomfort.    Current Outpatient Prescriptions  Medication Sig Dispense Refill  . aspirin 81 MG tablet Take 81 mg by mouth daily.        . fish oil-omega-3 fatty acids 1000 MG capsule Take 1 g by mouth daily.        . fluticasone-salmeterol (ADVAIR HFA) 115-21 MCG/ACT inhaler Inhale 2 puffs into the lungs 2 (two) times daily.        . furosemide (LASIX) 40 MG tablet Take 40 mg by mouth 2 (two) times daily.        . hydrALAZINE (APRESOLINE) 10 MG tablet Take 1 tablet (10 mg total) by mouth 3 (three) times daily.  90 tablet  12  . isosorbide mononitrate (IMDUR) 30 MG 24 hr tablet Take 1 tablet (30 mg total) by mouth daily.   30 tablet  11  . LANTUS SOLOSTAR 100 UNIT/ML injection Inject 10 Units into the skin at bedtime.       . metFORMIN (GLUCOPHAGE) 1000 MG tablet Take 1,000 mg by mouth 2 (two) times daily with a meal.      . metoprolol tartrate (LOPRESSOR) 25 MG tablet Take 25 mg by mouth 2 (two) times daily.        . Multiple Vitamin (MULTIVITAMIN) tablet Take 1 tablet by mouth daily.        . pantoprazole (PROTONIX) 40 MG tablet Take 40 mg by mouth daily.        . pravastatin (PRAVACHOL) 40 MG tablet Take 1 tablet (40 mg total) by mouth daily.  90 tablet  3  . traMADol (ULTRAM) 50 MG tablet Take 50 mg by mouth every 6 (six) hours as needed. For pain        Allergies  Allergen Reactions  . Sulfa Antibiotics Swelling    Past Medical History  Diagnosis Date  . Diabetes mellitus   . Hypertension   . Asthma     start dulera 100 April 12,2011 > better but "knot in throat" so try qvar June 7,2011 > preferred dulera. HFA 90%  May 10,2011 > 90% October 17,2011. PFT's June 7,2011 wnl x minimal nonspecific mid flow reduction while on dulera. Changed to advair intermediate strength October 17,2011 due to ins issue  . Heart failure   . Ischemic cardiomyopathy      Total occlusion   left anterior descending /AV Circ /Thread like RCA EF 30-35%Echo 2012  . Hoarseness     onset 11/09. neg w/u 09/2008. C Newman  . presyncope     No past surgical history on file.  No family history on file.  History   Social History  . Marital Status: Single    Spouse Name: N/A    Number of Children: N/A  . Years of Education: N/A   Occupational History  . Unemployed    Social History Main Topics  . Smoking status: Former Smoker -- 2.0 packs/day for 18 years    Types: Cigarettes    Quit date: 10/31/1980  . Smokeless tobacco: Not on file  . Alcohol Use: Yes     occasional  . Drug Use: No  . Sexually Active: Not on file   Other Topics Concern  . Not on file   Social History Narrative  . No narrative on file     ROS: Please see the HPI.  All other systems reviewed and negative.  PHYSICAL EXAM:  BP 118/72  Pulse 76  Ht 5\' 6"  (1.676 m)  Wt 169 lb 12.8 oz (77.021 kg)  BMI 27.41 kg/m2  General: Well developed, well nourished, in no acute distress. Head:  Normocephalic and atraumatic. Neck: no JVD Lungs: Clear to auscultation and percussion. Heart: Normal S1 and S2.  Prom S4 gallop.    Abdomen:  Normal bowel sounds; soft; non tender; no organomegaly.  ? Liver edge Pulses: Pulses normal in all 4 extremities. Extremities: No clubbing or cyanosis. No edema. Neurologic: Alert and oriented x 3.  EKG:    ASSESSMENT AND PLAN:

## 2011-05-24 NOTE — Assessment & Plan Note (Signed)
Functional class II status.  Developed hyperkalemia on ACE.  Now on nitrates and hydralazine.  Continue for present.  Recheck bmet.

## 2011-05-24 NOTE — Assessment & Plan Note (Signed)
Currently at target on statin.

## 2011-05-24 NOTE — Patient Instructions (Signed)
Your physician recommends that you schedule a follow-up appointment in: 2 MONTHS with Dr Riley Kill  Your physician recommends that you have lab work today: BMP  You have been referred to [facility] for prostate disease and enlarged bladder.

## 2011-05-24 NOTE — Assessment & Plan Note (Signed)
Managed by primary care. 

## 2011-05-25 ENCOUNTER — Telehealth: Payer: Self-pay | Admitting: Cardiology

## 2011-05-25 NOTE — Telephone Encounter (Signed)
Spoke with pt, not sure who called him. Pt given lab results. Pt was called by Mariane Masters. She will call pt back.

## 2011-05-25 NOTE — Telephone Encounter (Signed)
Fu call °Patient returning your call °

## 2011-06-07 ENCOUNTER — Telehealth: Payer: Self-pay | Admitting: Cardiology

## 2011-06-07 DIAGNOSIS — I2510 Atherosclerotic heart disease of native coronary artery without angina pectoris: Secondary | ICD-10-CM

## 2011-06-07 NOTE — Telephone Encounter (Signed)
Please return call to patient at hm# [phone]  Patient returning nurse call from last week, he can be reached at hm#

## 2011-06-07 NOTE — Telephone Encounter (Signed)
Pt aware of BMP results by phone.  The pt will have a repeat BMP drawn on 06/21/11.

## 2011-06-21 ENCOUNTER — Other Ambulatory Visit (INDEPENDENT_AMBULATORY_CARE_PROVIDER_SITE_OTHER): Payer: Medicare Other

## 2011-06-21 DIAGNOSIS — I2510 Atherosclerotic heart disease of native coronary artery without angina pectoris: Secondary | ICD-10-CM

## 2011-06-21 LAB — BASIC METABOLIC PANEL
BUN: 20 mg/dL (ref 6–23)
CO2: 27 mEq/L (ref 19–32)
Calcium: 9.6 mg/dL (ref 8.4–10.5)
Chloride: 101 mEq/L (ref 96–112)
Creatinine, Ser: 1.3 mg/dL (ref 0.4–1.5)
GFR: 59.1 mL/min — ABNORMAL LOW (ref >60.00)
Glucose, Bld: 96 mg/dL (ref 70–99)
Potassium: 3.8 mEq/L (ref 3.5–5.1)
Sodium: 141 mEq/L (ref 135–145)

## 2011-07-25 ENCOUNTER — Telehealth: Payer: Self-pay | Admitting: Cardiology

## 2011-07-25 ENCOUNTER — Other Ambulatory Visit: Payer: Self-pay

## 2011-07-25 ENCOUNTER — Emergency Department (HOSPITAL_COMMUNITY): Payer: Medicare Other

## 2011-07-25 ENCOUNTER — Emergency Department (HOSPITAL_COMMUNITY)
Admission: EM | Admit: 2011-07-25 | Discharge: 2011-07-25 | Disposition: A | Discharge status: Home / Self Care | Payer: Medicare Other | Attending: Emergency Medicine | Admitting: Emergency Medicine

## 2011-07-25 ENCOUNTER — Encounter: Payer: Self-pay | Admitting: Cardiology

## 2011-07-25 ENCOUNTER — Encounter (HOSPITAL_COMMUNITY): Payer: Self-pay | Admitting: Emergency Medicine

## 2011-07-25 ENCOUNTER — Ambulatory Visit (INDEPENDENT_AMBULATORY_CARE_PROVIDER_SITE_OTHER): Payer: Medicare Other | Admitting: Cardiology

## 2011-07-25 ENCOUNTER — Other Ambulatory Visit (INDEPENDENT_AMBULATORY_CARE_PROVIDER_SITE_OTHER): Payer: Medicare Other

## 2011-07-25 VITALS — BP 100/68 | HR 75 | Ht 67.0 in | Wt 151.1 lb

## 2011-07-25 DIAGNOSIS — E875 Hyperkalemia: Secondary | ICD-10-CM

## 2011-07-25 DIAGNOSIS — I10 Essential (primary) hypertension: Secondary | ICD-10-CM | POA: Insufficient documentation

## 2011-07-25 DIAGNOSIS — J45909 Unspecified asthma, uncomplicated: Secondary | ICD-10-CM | POA: Insufficient documentation

## 2011-07-25 DIAGNOSIS — E119 Type 2 diabetes mellitus without complications: Secondary | ICD-10-CM | POA: Insufficient documentation

## 2011-07-25 DIAGNOSIS — E7800 Pure hypercholesterolemia, unspecified: Secondary | ICD-10-CM

## 2011-07-25 DIAGNOSIS — N400 Enlarged prostate without lower urinary tract symptoms: Secondary | ICD-10-CM | POA: Insufficient documentation

## 2011-07-25 DIAGNOSIS — I2589 Other forms of chronic ischemic heart disease: Secondary | ICD-10-CM

## 2011-07-25 DIAGNOSIS — N320 Bladder-neck obstruction: Secondary | ICD-10-CM | POA: Insufficient documentation

## 2011-07-25 DIAGNOSIS — I2510 Atherosclerotic heart disease of native coronary artery without angina pectoris: Secondary | ICD-10-CM

## 2011-07-25 DIAGNOSIS — I255 Ischemic cardiomyopathy: Secondary | ICD-10-CM

## 2011-07-25 HISTORY — DX: Benign prostatic hyperplasia without lower urinary tract symptoms: N40.0

## 2011-07-25 LAB — BASIC METABOLIC PANEL
BUN: 27 mg/dL — ABNORMAL HIGH (ref 6–23)
BUN: 31 mg/dL — ABNORMAL HIGH (ref 6–23)
CO2: 26 mEq/L (ref 19–32)
CO2: 25 mEq/L (ref 19–32)
Calcium: 10.3 mg/dL (ref 8.4–10.5)
Calcium: 10.2 mg/dL (ref 8.4–10.5)
Chloride: 104 mEq/L (ref 96–112)
Chloride: 99 mEq/L (ref 96–112)
Creatinine, Ser: 1.4 mg/dL (ref 0.4–1.5)
Creatinine, Ser: 1.67 mg/dL — ABNORMAL HIGH (ref 0.50–1.35)
GFR calc Af Amer: 48 mL/min — ABNORMAL LOW (ref >90)
GFR calc non Af Amer: 41 mL/min — ABNORMAL LOW (ref >90)
GFR: 53.32 mL/min — ABNORMAL LOW (ref >60.00)
Glucose, Bld: 130 mg/dL — ABNORMAL HIGH (ref 70–99)
Glucose, Bld: 137 mg/dL — ABNORMAL HIGH (ref 70–99)
Potassium: 6.1 mEq/L (ref 3.5–5.1)
Potassium: 5.3 mEq/L — ABNORMAL HIGH (ref 3.5–5.1)
Sodium: 142 mEq/L (ref 135–145)
Sodium: 136 mEq/L (ref 135–145)

## 2011-07-25 LAB — CBC
HCT: 35.7 % — ABNORMAL LOW (ref 39.0–52.0)
Hemoglobin: 12 g/dL — ABNORMAL LOW (ref 13.0–17.0)
MCH: 29.9 pg (ref 26.0–34.0)
MCHC: 33.6 g/dL (ref 30.0–36.0)
MCV: 89 fL (ref 78.0–100.0)
Platelets: 323 10*3/uL (ref 150–400)
RBC: 4.01 MIL/uL — ABNORMAL LOW (ref 4.22–5.81)
RDW: 13.2 % (ref 11.5–15.5)
WBC: 7.4 10*3/uL (ref 4.0–10.5)

## 2011-07-25 LAB — POCT I-STAT, CHEM 8
BUN: 33 mg/dL — ABNORMAL HIGH (ref 6–23)
Calcium, Ion: 1.25 mmol/L (ref 1.12–1.32)
Chloride: 105 mEq/L (ref 96–112)
Creatinine, Ser: 1.7 mg/dL — ABNORMAL HIGH (ref 0.50–1.35)
Glucose, Bld: 138 mg/dL — ABNORMAL HIGH (ref 70–99)
HCT: 37 % — ABNORMAL LOW (ref 39.0–52.0)
Hemoglobin: 12.6 g/dL — ABNORMAL LOW (ref 13.0–17.0)
Potassium: 5.2 mEq/L — ABNORMAL HIGH (ref 3.5–5.1)
Sodium: 138 mEq/L (ref 135–145)
TCO2: 26 mmol/L (ref 0–100)

## 2011-07-25 LAB — POCT I-STAT TROPONIN I: Troponin i, poc: 0.01 ng/mL (ref 0.00–0.08)

## 2011-07-25 LAB — DIFFERENTIAL
Basophils Absolute: 0 10*3/uL (ref 0.0–0.1)
Basophils Relative: 0 % (ref 0–1)
Eosinophils Absolute: 0.3 10*3/uL (ref 0.0–0.7)
Eosinophils Relative: 5 % (ref 0–5)
Lymphocytes Relative: 26 % (ref 12–46)
Lymphs Abs: 1.9 10*3/uL (ref 0.7–4.0)
Monocytes Absolute: 0.5 10*3/uL (ref 0.1–1.0)
Monocytes Relative: 7 % (ref 3–12)
Neutro Abs: 4.7 10*3/uL (ref 1.7–7.7)
Neutrophils Relative %: 63 % (ref 43–77)

## 2011-07-25 LAB — POTASSIUM: Potassium: 5.8 mEq/L — ABNORMAL HIGH (ref 3.5–5.1)

## 2011-07-25 MED ORDER — SODIUM POLYSTYRENE SULFONATE 15 GM/60ML PO SUSP
30.0000 g | Freq: Once | ORAL | Status: AC
  Administered 2011-07-25: 30 g via ORAL
  Filled 2011-07-25: qty 120

## 2011-07-25 MED ORDER — SODIUM POLYSTYRENE SULFONATE PO POWD: ORAL | Status: DC

## 2011-07-25 NOTE — Discharge Instructions (Signed)
It is very important to followup closely with primary care provider, urology, cardiology, and especially nephrology. At this point is very important to see a nephrologist to further discuss your renal insufficiency (kidney dysfunction) and your recurrent high potassium. Take Kayexalate as directed. However return to emergency department at any time for changing or worsening symptoms. Call your primary care provider's office tomorrow to schedule close followup appointment for redraw of labs.  Hyperkalemia Hyperkalemia is when you have too much potassium in your blood. This can be a life-threatening condition. Potassium is normally removed (excreted) from the body by the kidneys. CAUSES  The potassium level in your body can become too high for the following reasons:  You take in too much potassium. You can do this by:   Using salt substitutes. They contain large amounts of potassium.   Taking potassium supplements from your caregiver. The dose may be too high for you.   Eating foods or taking nutritional products with potassium.   You excrete too little potassium. This can happen if:   Your kidneys are not functioning properly. Kidney (renal) disease is a very common cause of hyperkalemia.   You are taking medicines that lower your excretion of potassium, such as certain diuretic medicines.   You have an adrenal gland disease called Addison's disease.   You have a urinary tract obstruction, such as kidney stones.   You are on treatment to mechanically clean your blood (dialysis) and you skip a treatment.   You release a high amount of potassium from your cells into your blood. You may have a condition that causes potassium to move from your cells to your bloodstream. This can happen with:   Injury to muscles or other tissues. Most potassium is stored in the muscles.   Severe burns or infections.   Acidic blood plasma (acidosis). Acidosis can result from many diseases, such as  uncontrolled diabetes.  SYMPTOMS  Usually, there are no symptoms unless the potassium is dangerously high or has risen very quickly. Symptoms may include:  Irregular or very slow heartbeat.   Feeling sick to your stomach (nauseous).   Tiredness (fatigue).   Nerve problems such as tingling of the skin, numbness of the hands or feet, weakness, or paralysis.  DIAGNOSIS  A simple blood test can measure the amount of potassium in your body. An electrocardiogram test of the heart can also help make the diagnosis. The heart may beat dangerously fast or slow down and stop beating with severe hyperkalemia.  TREATMENT  Treatment depends on how bad the condition is and on the underlying cause.  If the hyperkalemia is an emergency (causing heart problems or paralysis), many different medicines can be used alone or together to lower the potassium level briefly. This may include an insulin injection even if you are not diabetic. Emergency dialysis may be needed to remove potassium from the body.   If the hyperkalemia is less severe or dangerous, the underlying cause is treated. This can include taking medicines if needed. Your prescription medicines may be changed. You may also need to take a medicine to help your body get rid of potassium. You may need to eat a diet low in potassium.  HOME CARE INSTRUCTIONS   Take medicines and supplements as directed by your caregiver.   Do not take any over-the-counter medicines, supplements, natural products, herbs, or vitamins without reviewing them with your caregiver. Certain supplements and natural food products can have high amounts of potassium. Other products (such as ibuprofen)  can damage weak kidneys and raise your potassium.   You may be asked to do repeat lab tests. Be sure to follow these directions.   If you have kidney disease, you may need to follow a low potassium diet.  SEEK MEDICAL CARE IF:   You notice an irregular or very slow heartbeat.    You feel lightheaded.   You develop weakness that is unusual for you.  SEEK IMMEDIATE MEDICAL CARE IF:   You have shortness of breath.   You have chest discomfort.   You pass out (faint).  MAKE SURE YOU:   Understand these instructions.   Will watch your condition.   Will get help right away if you are not doing well or get worse.  Document Released: 03/10/2002 Document Revised: 03/09/2011 Document Reviewed: 08/25/2010 [facility] Patient Information 2012 [place], [place].

## 2011-07-25 NOTE — Telephone Encounter (Signed)
I left a second message on the pt's voicemail about potassium level.

## 2011-07-25 NOTE — Telephone Encounter (Signed)
Pt calling re bloodwork results.

## 2011-07-25 NOTE — Patient Instructions (Signed)
Your physician recommends that you have lab work today: BMP  Your physician recommends that you schedule a follow-up appointment in: 2 MONTHS  Your physician recommends that you continue on your current medications as directed. Please refer to the Current Medication list given to you today.  

## 2011-07-25 NOTE — ED Notes (Signed)
Pt placed on monitor.  

## 2011-07-25 NOTE — Progress Notes (Signed)
HPI:  Patient seen today in follow up.  He is stable.  He is not having any problems.  Now has a foley cath that was put in by Dr. Julien Girt for obstructive uropathy.  No chest pain.  Stable at present.  Prior neg EP Study----so no defib.    Current Outpatient Prescriptions  Medication Sig Dispense Refill  . aspirin 81 MG tablet Take 81 mg by mouth daily.        . fish oil-omega-3 fatty acids 1000 MG capsule Take 1 g by mouth daily.        . fluticasone-salmeterol (ADVAIR HFA) 115-21 MCG/ACT inhaler Inhale 2 puffs into the lungs 2 (two) times daily.        . furosemide (LASIX) 40 MG tablet Take 40 mg by mouth 2 (two) times daily.        . hydrALAZINE (APRESOLINE) 10 MG tablet Take 1 tablet (10 mg total) by mouth 3 (three) times daily.  90 tablet  12  . isosorbide mononitrate (IMDUR) 30 MG 24 hr tablet Take 1 tablet (30 mg total) by mouth daily.  30 tablet  11  . LANTUS SOLOSTAR 100 UNIT/ML injection Inject 10 Units into the skin at bedtime.       . metFORMIN (GLUCOPHAGE) 1000 MG tablet Take 1,000 mg by mouth 2 (two) times daily with a meal.      . metoprolol tartrate (LOPRESSOR) 25 MG tablet Take 25 mg by mouth 2 (two) times daily.        . Multiple Vitamin (MULTIVITAMIN) tablet Take 1 tablet by mouth daily.        . pantoprazole (PROTONIX) 40 MG tablet Take 40 mg by mouth daily.        . pravastatin (PRAVACHOL) 40 MG tablet Take 1 tablet (40 mg total) by mouth daily.  90 tablet  3  . traMADol (ULTRAM) 50 MG tablet Take 50 mg by mouth every 6 (six) hours as needed. For pain        Allergies  Allergen Reactions  . Sulfa Antibiotics Swelling    Past Medical History  Diagnosis Date  . Diabetes mellitus   . Hypertension   . Asthma     start dulera 100 April 12,2011 > better but "knot in throat" so try qvar June 7,2011 > preferred dulera. HFA 90% May 10,2011 > 90% October 17,2011. PFT's June 7,2011 wnl x minimal nonspecific mid flow reduction while on dulera. Changed to advair intermediate  strength October 17,2011 due to ins issue  . Heart failure   . Ischemic cardiomyopathy      Total occlusion   left anterior descending /AV Circ /Thread like RCA EF 30-35%Echo 2012  . Hoarseness     onset 11/09. neg w/u 09/2008. C Newman  . presyncope     No past surgical history on file.  No family history on file.  History   Social History  . Marital Status: Single    Spouse Name: N/A    Number of Children: N/A  . Years of Education: N/A   Occupational History  . Unemployed    Social History Main Topics  . Smoking status: Former Smoker -- 2.0 packs/day for 18 years    Types: Cigarettes    Quit date: 10/31/1980  . Smokeless tobacco: Not on file  . Alcohol Use: Yes     occasional  . Drug Use: No  . Sexually Active: Not on file   Other Topics Concern  . Not on file  Social History Narrative  . No narrative on file    ROS: Please see the HPI.  All other systems reviewed and negative.  PHYSICAL EXAM:  BP 100/68  Pulse 75  Ht 5\' 7"  (1.702 m)  Wt 151 lb 1.9 oz (68.548 kg)  BMI 23.67 kg/m2  General: Well developed, well nourished, in no acute distress. Head:  Normocephalic and atraumatic. Neck: no JVD. Back:  prob lipoma over back.   Lungs: Clear to auscultation and percussion. Heart: Normal S1 and S2. Prom S4 gallop.  Abdomen:  Normal bowel sounds; soft; non tender; no organomegaly Pulses: Pulses normal in all 4 extremities. Extremities: No clubbing or cyanosis. No edema. Neurologic: Alert and oriented x 3.  EKG:  NSR.  Delay in R wave progression, but normal.    ASSESSMENT AND PLAN:

## 2011-07-25 NOTE — Assessment & Plan Note (Signed)
Class II at present.

## 2011-07-25 NOTE — Assessment & Plan Note (Signed)
At target presently.

## 2011-07-25 NOTE — Assessment & Plan Note (Signed)
Stable at present.  No current symptoms.  Continue medical therapy.   

## 2011-07-25 NOTE — Assessment & Plan Note (Signed)
Recheck BMET.  Should be better with obstructive uropathy treated.

## 2011-07-25 NOTE — ED Provider Notes (Signed)
History     CSN: [id number]  Arrival date & time 07/25/11  8657   First MD Initiated Contact with Patient 07/25/11 2058      Chief Complaint  Patient presents with  . Abnormal Lab    (Consider location/radiation/quality/duration/timing/severity/associated sxs/prior treatment) HPI  Patient presents to emergency department with complaint of abnormal lab, high potassium. Patient states that he had gone to his cardiologist office today to have followup blood work. Patient states he had the blood work and returned home, however he was called as his home and instructed to go to the emergency department because his potassium was elevated. Patient states he has felt well over the last few days and has no complaint. Patient states that over the last few months he's had recurrent episodes of hyperkalemia. Patient states that he has recently been followed by Dr. Julien Girt because of concern for obstructive bladder problems and has an indwelling Foley catheter. Patient states that urine is flowing freely without any complaints. Patient is lying comfortably in bed without complaints. He denies abdominal pain, dysuria, hematuria, chest pain or any other complaint. Patient states that he has been required to present to the emergency department on numerous occasions because of elevated potassium where he will take Kayexalate in the ER and then been sent home to take Kayexalate for further treatment. Patient states that between Dr. Tedra Senegal, and Dr. Julien Girt he has been treated but has not been seen by nephrologist. Patient states that he is unaware of any intention to send him to a nephrologist at this point. Patient denies history of kidney disease. Patient had no treatment for hyperkalemia prior to arrival. Patient is on daily Lasix by his cardiologist and took his daily dose this morning.  Past Medical History  Diagnosis Date  . Diabetes mellitus   . Hypertension   . Asthma     start dulera 100 April 12,2011 >  better but "knot in throat" so try qvar June 7,2011 > preferred dulera. HFA 90% May 10,2011 > 90% October 17,2011. PFT's June 7,2011 wnl x minimal nonspecific mid flow reduction while on dulera. Changed to advair intermediate strength October 17,2011 due to ins issue  . Heart failure   . Ischemic cardiomyopathy      Total occlusion   left anterior descending /AV Circ /Thread like RCA EF 30-35%Echo 2012  . Hoarseness     onset 11/09. neg w/u 09/2008. C Newman  . presyncope   . Enlarged prostate     History reviewed. No pertinent past surgical history.  History reviewed. No pertinent family history.  History  Substance Use Topics  . Smoking status: Former Smoker -- 2.0 packs/day for 18 years    Types: Cigarettes    Quit date: 10/31/1980  . Smokeless tobacco: Not on file  . Alcohol Use: Yes     occasional      Review of Systems  All other systems reviewed and are negative.    Allergies  Sulfa antibiotics  Home Medications   Current Outpatient Rx  Name Route Sig Dispense Refill  . ASPIRIN 81 MG PO TABS Oral Take 81 mg by mouth daily.      . OMEGA-3 FATTY ACIDS 1000 MG PO CAPS Oral Take 1 g by mouth daily.      Marland Kitchen FLUTICASONE-SALMETEROL 115-21 MCG/ACT IN AERO Inhalation Inhale 2 puffs into the lungs 2 (two) times daily.      . FUROSEMIDE 40 MG PO TABS Oral Take 40 mg by mouth 2 (two) times  daily.      Marland Kitchen HYDRALAZINE HCL 10 MG PO TABS Oral Take 10 mg by mouth 3 (three) times daily.    . ISOSORBIDE MONONITRATE ER 30 MG PO TB24 Oral Take 30 mg by mouth every morning.    Marland Kitchen LANTUS SOLOSTAR 100 UNIT/ML [place] SOLN Subcutaneous Inject 10 Units into the skin at bedtime.     Marland Kitchen METFORMIN HCL 1000 MG PO TABS Oral Take 1,000 mg by mouth 2 (two) times daily with a meal.    . METOPROLOL TARTRATE 25 MG PO TABS Oral Take 25 mg by mouth 2 (two) times daily.      Marland Kitchen ONE-DAILY MULTI VITAMINS PO TABS Oral Take 1 tablet by mouth daily.      Marland Kitchen PANTOPRAZOLE SODIUM 40 MG PO TBEC Oral Take 40 mg by mouth  daily.      Marland Kitchen PRAVASTATIN SODIUM 40 MG PO TABS Oral Take 40 mg by mouth every morning.    Marland Kitchen TRAMADOL HCL 50 MG PO TABS Oral Take 50 mg by mouth every 6 (six) hours as needed. For pain      BP 112/62  Pulse 80  Temp(Src) 98 F (36.7 C) (Oral)  Resp 19  SpO2 100%  Physical Exam  Nursing note and vitals reviewed. Constitutional: He is oriented to person, place, and time. He appears well-developed and well-nourished. No distress.  HENT:  Head: Normocephalic and atraumatic.  Eyes: Conjunctivae are normal.  Neck: Normal range of motion. Neck supple.  Cardiovascular: Normal rate, regular rhythm, normal heart sounds and intact distal pulses.  Exam reveals no gallop and no friction rub.   No murmur heard. Pulmonary/Chest: Effort normal and breath sounds normal. No respiratory distress. He has no wheezes. He has no rales. He exhibits no tenderness.  Abdominal: Soft. Bowel sounds are normal. He exhibits no distension and no mass. There is no tenderness. There is no rebound and no guarding.  Genitourinary: Penis normal.       Indwelling Foley catheter with free flowing urine that is yellow to clear. No erythema of penis. No hematuria and back.  Musculoskeletal: Normal range of motion. He exhibits no edema and no tenderness.  Neurological: He is alert and oriented to person, place, and time.  Skin: Skin is warm and dry. No rash noted. He is not diaphoretic. No erythema.  Psychiatric: He has a normal mood and affect.    ED Course  Procedures (including critical care time)  PO kayexelate  Patient declines IV and IV fluids. Patient states he wishes to take Kayexalate as he has has to follow up closely with his primary care provider, urologist, and cardiologist. Patient is very agreeable to following up with nephrologist closely given that he has had recurrent hyperkalemia and ongoing renal insufficiency despite having indwelling catheter.  Assessment and plan discussed with Dr. Juleen China. He agrees  with both.   Date: 07/25/2011  Rate: 76  Rhythm: normal sinus rhythm  QRS Axis: normal  Intervals: normal  ST/T Wave abnormalities: normal  Conduction Disutrbances:none  Narrative Interpretation: Non-provocative EKG compared to 03/21/2011. No peak T waves  Old EKG Reviewed: unchanged    Labs Reviewed  CBC - Abnormal; Notable for the following:    RBC 4.01 (*)    Hemoglobin 12.0 (*)    HCT 35.7 (*)    All other components within normal limits  BASIC METABOLIC PANEL - Abnormal; Notable for the following:    Potassium 5.3 (*)    Glucose, Bld 137 (*)  BUN 31 (*)    Creatinine, Ser 1.67 (*)    GFR calc non Af Amer 41 (*)    GFR calc Af Amer 48 (*)    All other components within normal limits  POCT I-STAT, CHEM 8 - Abnormal; Notable for the following:    Potassium 5.2 (*)    BUN 33 (*)    Creatinine, Ser 1.70 (*)    Glucose, Bld 138 (*)    Hemoglobin 12.6 (*)    HCT 37.0 (*)    All other components within normal limits  DIFFERENTIAL  POCT I-STAT TROPONIN I   Dg Chest 2 View  07/25/2011  *RADIOLOGY REPORT*  Clinical Data: Hyperkalemia  CHEST - 2 VIEW  Comparison: 10/19/2010  Findings: Heart size is normal.  Remote left posterior eighth rib fracture noted.  No pleural effusion.  No focal pulmonary opacity. No acute osseous finding.  IMPRESSION: No acute cardiopulmonary process.  Original Report Authenticated By: Harrel Lemon, M.D.     1. Hyperkalemia   2. Bladder outlet obstruction       MDM  Patient remains asymptomatic and without complaint throughout ER stay. Free flowing urine from catheter without obstruction. Patient declines IV and IV fluids. He had PO Kayexalate in the ER. Patient wishes to proceed with prior treatment of Kayexalate at home for mild hyperkalemia. Spoke at length with patient about the importance of following up closely with a nephrologist as well as urology and PCP for recheck of labs. He voices his understanding and is agreeable to plan. We  discussed at length reasons return to emergency department. Patient voices understanding and is agreeable to plan.        Jenness Corner, [place] 07/25/11 2245

## 2011-07-25 NOTE — ED Notes (Signed)
Patient was referred to ED by his Cardiologist; patient had lab work done today and was told his Potassium is 5.7.  Patient denies any pain or symptoms.

## 2011-07-25 NOTE — Telephone Encounter (Signed)
Per Dr Riley Kill potassium 5.8 on recheck.  The pt should go to ER for treatment.  I left a message on the pt's voicemail.

## 2011-07-27 NOTE — Telephone Encounter (Signed)
The pt went to the ER on 07/25/11 as directed by Dr Riley Kill.

## 2011-07-30 NOTE — ED Provider Notes (Signed)
Medical screening examination/treatment/procedure(s) were performed by non-physician practitioner and as supervising physician I was immediately available for consultation/collaboration.  Raeford Razor, MD 07/30/11 1335

## 2011-08-07 ENCOUNTER — Ambulatory Visit (INDEPENDENT_AMBULATORY_CARE_PROVIDER_SITE_OTHER): Payer: Medicare Other | Admitting: *Deleted

## 2011-08-07 DIAGNOSIS — I2510 Atherosclerotic heart disease of native coronary artery without angina pectoris: Secondary | ICD-10-CM

## 2011-08-07 LAB — BASIC METABOLIC PANEL
BUN: 15 mg/dL (ref 6–23)
CO2: 26 mEq/L (ref 19–32)
Calcium: 8.8 mg/dL (ref 8.4–10.5)
Chloride: 105 mEq/L (ref 96–112)
Creatinine, Ser: 0.9 mg/dL (ref 0.4–1.5)
GFR: 93.08 mL/min (ref >60.00)
Glucose, Bld: 92 mg/dL (ref 70–99)
Potassium: 4.6 mEq/L (ref 3.5–5.1)
Sodium: 143 mEq/L (ref 135–145)

## 2011-08-08 ENCOUNTER — Encounter: Payer: Self-pay | Admitting: Cardiology

## 2011-08-08 ENCOUNTER — Other Ambulatory Visit: Payer: Self-pay

## 2011-08-08 DIAGNOSIS — E875 Hyperkalemia: Secondary | ICD-10-CM

## 2011-08-15 ENCOUNTER — Other Ambulatory Visit (INDEPENDENT_AMBULATORY_CARE_PROVIDER_SITE_OTHER): Payer: Medicare Other

## 2011-08-15 DIAGNOSIS — E875 Hyperkalemia: Secondary | ICD-10-CM

## 2011-08-15 LAB — BASIC METABOLIC PANEL
BUN: 16 mg/dL (ref 6–23)
CO2: 29 mEq/L (ref 19–32)
Calcium: 9.2 mg/dL (ref 8.4–10.5)
Chloride: 105 mEq/L (ref 96–112)
Creatinine, Ser: 1 mg/dL (ref 0.4–1.5)
GFR: 79.26 mL/min (ref >60.00)
Glucose, Bld: 112 mg/dL — ABNORMAL HIGH (ref 70–99)
Potassium: 4.2 mEq/L (ref 3.5–5.1)
Sodium: 141 mEq/L (ref 135–145)

## 2011-08-19 ENCOUNTER — Other Ambulatory Visit: Payer: Self-pay | Admitting: Physician Assistant

## 2011-08-30 ENCOUNTER — Other Ambulatory Visit: Payer: Self-pay | Admitting: Physician Assistant

## 2011-09-25 ENCOUNTER — Ambulatory Visit (INDEPENDENT_AMBULATORY_CARE_PROVIDER_SITE_OTHER): Payer: Medicare Other | Admitting: Cardiology

## 2011-09-25 VITALS — BP 92/48 | HR 60 | Ht 67.0 in | Wt 152.0 lb

## 2011-09-25 DIAGNOSIS — E7800 Pure hypercholesterolemia, unspecified: Secondary | ICD-10-CM

## 2011-09-25 DIAGNOSIS — E875 Hyperkalemia: Secondary | ICD-10-CM

## 2011-09-25 DIAGNOSIS — I5022 Chronic systolic (congestive) heart failure: Secondary | ICD-10-CM

## 2011-09-25 DIAGNOSIS — I255 Ischemic cardiomyopathy: Secondary | ICD-10-CM

## 2011-09-25 DIAGNOSIS — I2510 Atherosclerotic heart disease of native coronary artery without angina pectoris: Secondary | ICD-10-CM

## 2011-09-25 DIAGNOSIS — I2589 Other forms of chronic ischemic heart disease: Secondary | ICD-10-CM

## 2011-09-25 LAB — BASIC METABOLIC PANEL
BUN: 18 mg/dL (ref 6–23)
CO2: 29 mEq/L (ref 19–32)
Calcium: 9.3 mg/dL (ref 8.4–10.5)
Chloride: 101 mEq/L (ref 96–112)
Creatinine, Ser: 1 mg/dL (ref 0.4–1.5)
GFR: 76.57 mL/min (ref >60.00)
Glucose, Bld: 108 mg/dL — ABNORMAL HIGH (ref 70–99)
Potassium: 4.3 mEq/L (ref 3.5–5.1)
Sodium: 139 mEq/L (ref 135–145)

## 2011-09-25 NOTE — Progress Notes (Signed)
HPI:  This is actually doing quite well. He denies any ongoing chest pain. He is not particularly short of breath. His overall health is improved. His blood pressure remained somewhat low.  Current Outpatient Prescriptions  Medication Sig Dispense Refill  . aspirin 81 MG tablet Take 81 mg by mouth daily.        . fish oil-omega-3 fatty acids 1000 MG capsule Take 1 g by mouth daily.        . fluticasone-salmeterol (ADVAIR HFA) 115-21 MCG/ACT inhaler Inhale 2 puffs into the lungs 2 (two) times daily.        . furosemide (LASIX) 40 MG tablet TAKE ONE TABLET BY MOUTH TWICE DAILY  180 tablet  10  . hydrALAZINE (APRESOLINE) 10 MG tablet Take 10 mg by mouth 3 (three) times daily.      . isosorbide mononitrate (IMDUR) 30 MG 24 hr tablet Take 30 mg by mouth every morning.      Marland Kitchen LANTUS SOLOSTAR 100 UNIT/ML injection Inject 10 Units into the skin at bedtime.       . metFORMIN (GLUCOPHAGE) 1000 MG tablet Take 1,000 mg by mouth 2 (two) times daily with a meal.      . metoprolol tartrate (LOPRESSOR) 25 MG tablet TAKE ONE TABLET BY MOUTH TWICE DAILY  180 tablet  1  . Multiple Vitamin (MULTIVITAMIN) tablet Take 1 tablet by mouth daily.        . pantoprazole (PROTONIX) 40 MG tablet Take 40 mg by mouth daily.        . pravastatin (PRAVACHOL) 40 MG tablet Take 40 mg by mouth every morning.      . traMADol (ULTRAM) 50 MG tablet Take 50 mg by mouth every 6 (six) hours as needed. For pain        Allergies  Allergen Reactions  . Sulfa Antibiotics Swelling    Past Medical History  Diagnosis Date  . Diabetes mellitus   . Hypertension   . Asthma     start dulera 100 April 12,2011 > better but "knot in throat" so try qvar June 7,2011 > preferred dulera. HFA 90% May 10,2011 > 90% October 17,2011. PFT's June 7,2011 wnl x minimal nonspecific mid flow reduction while on dulera. Changed to advair intermediate strength October 17,2011 due to ins issue  . Heart failure   . Ischemic cardiomyopathy      Total  occlusion   left anterior descending /AV Circ /Thread like RCA EF 30-35%Echo 2012  . Hoarseness     onset 11/09. neg w/u 09/2008. C Newman  . presyncope   . Enlarged prostate     No past surgical history on file.  No family history on file.  History   Social History  . Marital Status: Single    Spouse Name: N/A    Number of Children: N/A  . Years of Education: N/A   Occupational History  . Unemployed    Social History Main Topics  . Smoking status: Former Smoker -- 2.0 packs/day for 18 years    Types: Cigarettes    Quit date: 10/31/1980  . Smokeless tobacco: Not on file  . Alcohol Use: Yes     occasional  . Drug Use: No  . Sexually Active: Not on file   Other Topics Concern  . Not on file   Social History Narrative  . No narrative on file    ROS: Please see the HPI.  All other systems reviewed and negative.  PHYSICAL EXAM:  There  were no vitals taken for this visit.  General: Thin gentleman, in no acute distress. Head:  Normocephalic and atraumatic. Neck: no JVD Lungs: Clear to auscultation and percussion. Heart: Normal S1 and S2.  S4 gallop.  Pulses: Pulses normal in all 4 extremities. Extremities: No clubbing or cyanosis. No edema. Neurologic: Alert and oriented x 3.  EKG:  NSR.  Left axis deviation.  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    ASSESSMENT AND PLAN:

## 2011-09-25 NOTE — Assessment & Plan Note (Addendum)
Pressure remained stable his current medical regimen. He denies any angina. Continue medical therapy.  We have talked about an MRI at some point, but his native anatomy is not so ideal.  We will get a cardiac MRI for viability and then compare to his anatomy.

## 2011-09-25 NOTE — Patient Instructions (Addendum)
Your physician recommends that you have lab work today: [facility]  Your physician recommends that you continue on your current medications as directed. Please refer to the Current Medication list given to you today.  Your physician recommends that you schedule a follow-up appointment in: 1 MONTH with Dr Riley Kill  Your physician has requested that you have a cardiac MRI. Cardiac MRI uses a computer to create images of your heart as its beating, producing both still and moving pictures of your heart and major blood vessels. For further information please visit [email or web address]. Please follow the instruction sheet given to you today for more information.

## 2011-09-25 NOTE — Assessment & Plan Note (Signed)
The patient remains at target with regard to his lipids will need to be rechecked in 3-4 months.

## 2011-09-25 NOTE — Assessment & Plan Note (Addendum)
Recheck K today.  He thinks the potential was using a lot of artificial salt was high in potassium. However, I believe he was off at one point in time and still had an elevated potassium. We'll recheck this in her clinic today.

## 2011-09-25 NOTE — Assessment & Plan Note (Signed)
The patient has significant left trochanter dysfunction. His coronaries are well described. His moderate MR, and pulmonary pressure elevation. He is symptomatically stable. He's had a negative electrophysiologic study, and no defibrillator is been recommended. We will continue to follow patient closely.

## 2011-10-03 ENCOUNTER — Encounter: Payer: Self-pay | Admitting: Cardiology

## 2011-10-09 ENCOUNTER — Inpatient Hospital Stay (HOSPITAL_COMMUNITY): Admission: RE | Admit: 2011-10-09 | Payer: Medicare Other | Source: Ambulatory Visit

## 2011-10-17 ENCOUNTER — Ambulatory Visit (HOSPITAL_COMMUNITY)
Admission: RE | Admit: 2011-10-17 | Discharge: 2011-10-17 | Disposition: A | Discharge status: Home / Self Care | Payer: Medicare Other | Source: Ambulatory Visit | Attending: Cardiology | Admitting: Cardiology

## 2011-10-17 DIAGNOSIS — I2510 Atherosclerotic heart disease of native coronary artery without angina pectoris: Secondary | ICD-10-CM

## 2011-10-17 DIAGNOSIS — I2589 Other forms of chronic ischemic heart disease: Secondary | ICD-10-CM | POA: Insufficient documentation

## 2011-10-17 DIAGNOSIS — I255 Ischemic cardiomyopathy: Secondary | ICD-10-CM

## 2011-10-17 DIAGNOSIS — I5022 Chronic systolic (congestive) heart failure: Secondary | ICD-10-CM | POA: Insufficient documentation

## 2011-10-17 MED ORDER — GADOBENATE DIMEGLUMINE 529 MG/ML IV SOLN
25.0000 mL | Freq: Once | INTRAVENOUS | Status: AC | PRN
  Administered 2011-10-17: 23 mL via INTRAVENOUS

## 2011-10-24 ENCOUNTER — Other Ambulatory Visit: Payer: Self-pay | Admitting: Physician Assistant

## 2011-11-02 ENCOUNTER — Ambulatory Visit (INDEPENDENT_AMBULATORY_CARE_PROVIDER_SITE_OTHER): Payer: Medicare Other | Admitting: Cardiology

## 2011-11-02 ENCOUNTER — Encounter: Payer: Self-pay | Admitting: Cardiology

## 2011-11-02 VITALS — BP 120/67 | HR 66 | Ht 67.0 in | Wt 155.9 lb

## 2011-11-02 DIAGNOSIS — I2589 Other forms of chronic ischemic heart disease: Secondary | ICD-10-CM

## 2011-11-02 DIAGNOSIS — I2510 Atherosclerotic heart disease of native coronary artery without angina pectoris: Secondary | ICD-10-CM

## 2011-11-02 DIAGNOSIS — N139 Obstructive and reflux uropathy, unspecified: Secondary | ICD-10-CM

## 2011-11-02 DIAGNOSIS — I255 Ischemic cardiomyopathy: Secondary | ICD-10-CM

## 2011-11-02 DIAGNOSIS — E7800 Pure hypercholesterolemia, unspecified: Secondary | ICD-10-CM

## 2011-11-02 LAB — BASIC METABOLIC PANEL
BUN: 15 mg/dL (ref 6–23)
CO2: 29 mEq/L (ref 19–32)
Calcium: 9.7 mg/dL (ref 8.4–10.5)
Chloride: 103 mEq/L (ref 96–112)
Creatinine, Ser: 0.8 mg/dL (ref 0.4–1.5)
GFR: 96.86 mL/min (ref >60.00)
Glucose, Bld: 137 mg/dL — ABNORMAL HIGH (ref 70–99)
Potassium: 4.1 mEq/L (ref 3.5–5.1)
Sodium: 140 mEq/L (ref 135–145)

## 2011-11-02 NOTE — Progress Notes (Signed)
HPI:  The patient returns back today in followup. He continues to have a Foley in place because of recurrent problems with his prostate. The plan is to do a procedure apparently, and then he would revert to self-catheterization to try to see if his bilateral improved. He feels okay at the present time, and has not been short of breath. Overall he is continued to do extremely well. We did review the results of his MRI scan today. We talked about the long-term implications.  Current Outpatient Prescriptions  Medication Sig Dispense Refill  . aspirin 81 MG tablet Take 81 mg by mouth daily.        . fish oil-omega-3 fatty acids 1000 MG capsule Take 1 g by mouth daily.        . fluticasone-salmeterol (ADVAIR HFA) 115-21 MCG/ACT inhaler Inhale 2 puffs into the lungs 2 (two) times daily.        . furosemide (LASIX) 40 MG tablet TAKE ONE TABLET BY MOUTH TWICE DAILY  180 tablet  10  . hydrALAZINE (APRESOLINE) 10 MG tablet Take 10 mg by mouth 3 (three) times daily.      . isosorbide mononitrate (IMDUR) 30 MG 24 hr tablet Take 30 mg by mouth every morning.      Marland Kitchen LANTUS SOLOSTAR 100 UNIT/ML injection Inject 10 Units into the skin at bedtime.       . metFORMIN (GLUCOPHAGE) 1000 MG tablet Take 1,000 mg by mouth 2 (two) times daily with a meal.      . metoprolol tartrate (LOPRESSOR) 25 MG tablet TAKE ONE TABLET BY MOUTH TWICE DAILY  180 tablet  1  . Multiple Vitamin (MULTIVITAMIN) tablet Take 1 tablet by mouth daily.        . pantoprazole (PROTONIX) 40 MG tablet TAKE 1 TABLET BY MOUTH DAILY  30 tablet  0  . pravastatin (PRAVACHOL) 40 MG tablet Take 40 mg by mouth every morning.      . traMADol (ULTRAM) 50 MG tablet Take 50 mg by mouth every 6 (six) hours as needed. For pain        Allergies  Allergen Reactions  . Sulfa Antibiotics Swelling    Past Medical History  Diagnosis Date  . Diabetes mellitus   . Hypertension   . Asthma     start dulera 100 April 12,2011 > better but "knot in throat" so try  qvar June 7,2011 > preferred dulera. HFA 90% May 10,2011 > 90% October 17,2011. PFT's June 7,2011 wnl x minimal nonspecific mid flow reduction while on dulera. Changed to advair intermediate strength October 17,2011 due to ins issue  . Heart failure   . Ischemic cardiomyopathy      Total occlusion   left anterior descending /AV Circ /Thread like RCA EF 30-35%Echo 2012  . Hoarseness     onset 11/09. neg w/u 09/2008. C Newman  . presyncope   . Enlarged prostate     No past surgical history on file.  No family history on file.  History   Social History  . Marital Status: Single    Spouse Name: N/A    Number of Children: N/A  . Years of Education: N/A   Occupational History  . Unemployed    Social History Main Topics  . Smoking status: Former Smoker -- 2.0 packs/day for 18 years    Types: Cigarettes    Quit date: 10/31/1980  . Smokeless tobacco: Never Used  . Alcohol Use: Yes     occasional  .  Drug Use: No  . Sexually Active: Not on file   Other Topics Concern  . Not on file   Social History Narrative  . No narrative on file    ROS: Please see the HPI.  All other systems reviewed and negative.  PHYSICAL EXAM:  BP 120/67  Pulse 66  Ht 5\' 7"  (1.702 m)  Wt 155 lb 14.4 oz (70.716 kg)  BMI 24.42 kg/m2  General: Thin gentleman , in no acute distress. Head:  Normocephalic and atraumatic. Neck: no JVD Lungs: Clear to auscultation and percussion. Heart: Normal S1 and S2.  S4 gallop.   Abdomen:  Normal bowel sounds; soft; non tender; no organomegaly.  Somewhat protuberant.   Pulses: Pulses normal in all 4 extremities. Extremities: No clubbing or cyanosis. No edema. Neurologic: Alert and oriented x 3.  EKG:  NSR.  PACs.  Non specific T wave flattening.    ASSESSMENT AND PLAN:

## 2011-11-02 NOTE — Patient Instructions (Addendum)
Your physician recommends that you have lab work today: [facility]  Your physician recommends that you schedule a follow-up appointment in: 2 MONTHS with Dr Riley Kill  Your physician recommends that you continue on your current medications as directed. Please refer to the Current Medication list given to you today.

## 2011-11-05 NOTE — Assessment & Plan Note (Signed)
He is not having any angina, and is actually doing quite well.  We will continue medical therapy.

## 2011-11-05 NOTE — Assessment & Plan Note (Signed)
At target on pravastatin. 

## 2011-11-05 NOTE — Assessment & Plan Note (Signed)
He is not short of breath, and continues to do pretty well.  His BP tolerates meds at this point.  We cannot use carvedilol because of asthma.  We may be able to titrate up his metoprolol to one and one half twice daily, and also move his hydralazine to 25 mg three times daily.  I will have him see EP back regarding the next step.  He is not excited about the current idea of an AICD and has expressed some desire to defer.

## 2011-11-05 NOTE — Assessment & Plan Note (Signed)
Patient is being monitored by [facility], with some planned surgery.

## 2011-11-14 ENCOUNTER — Encounter (INDEPENDENT_AMBULATORY_CARE_PROVIDER_SITE_OTHER): Payer: Medicare Other | Admitting: Ophthalmology

## 2011-11-14 DIAGNOSIS — E1165 Type 2 diabetes mellitus with hyperglycemia: Secondary | ICD-10-CM

## 2011-11-14 DIAGNOSIS — E1139 Type 2 diabetes mellitus with other diabetic ophthalmic complication: Secondary | ICD-10-CM

## 2011-11-14 DIAGNOSIS — E11319 Type 2 diabetes mellitus with unspecified diabetic retinopathy without macular edema: Secondary | ICD-10-CM

## 2011-11-14 DIAGNOSIS — H3581 Retinal edema: Secondary | ICD-10-CM

## 2011-11-14 DIAGNOSIS — H2510 Age-related nuclear cataract, unspecified eye: Secondary | ICD-10-CM

## 2011-11-14 DIAGNOSIS — H35039 Hypertensive retinopathy, unspecified eye: Secondary | ICD-10-CM

## 2011-11-14 DIAGNOSIS — I10 Essential (primary) hypertension: Secondary | ICD-10-CM

## 2011-11-14 DIAGNOSIS — H43819 Vitreous degeneration, unspecified eye: Secondary | ICD-10-CM

## 2011-11-21 ENCOUNTER — Other Ambulatory Visit: Payer: Self-pay | Admitting: Physician Assistant

## 2011-11-21 ENCOUNTER — Other Ambulatory Visit: Payer: Self-pay | Admitting: Cardiology

## 2011-11-21 NOTE — Telephone Encounter (Signed)
Fax Received. Refill Completed. Daylen Lipsky Chowoe (R.M.A)   

## 2011-11-27 ENCOUNTER — Ambulatory Visit (INDEPENDENT_AMBULATORY_CARE_PROVIDER_SITE_OTHER): Payer: Medicare Other | Admitting: Ophthalmology

## 2011-11-27 DIAGNOSIS — E1165 Type 2 diabetes mellitus with hyperglycemia: Secondary | ICD-10-CM

## 2011-11-27 DIAGNOSIS — E1139 Type 2 diabetes mellitus with other diabetic ophthalmic complication: Secondary | ICD-10-CM

## 2011-11-27 DIAGNOSIS — H3581 Retinal edema: Secondary | ICD-10-CM

## 2011-11-27 DIAGNOSIS — E11319 Type 2 diabetes mellitus with unspecified diabetic retinopathy without macular edema: Secondary | ICD-10-CM

## 2011-12-14 ENCOUNTER — Other Ambulatory Visit: Payer: Self-pay | Admitting: Urology

## 2011-12-14 ENCOUNTER — Ambulatory Visit (INDEPENDENT_AMBULATORY_CARE_PROVIDER_SITE_OTHER): Payer: Medicare Other | Admitting: General Surgery

## 2011-12-14 ENCOUNTER — Encounter (INDEPENDENT_AMBULATORY_CARE_PROVIDER_SITE_OTHER): Payer: Self-pay | Admitting: General Surgery

## 2011-12-14 VITALS — BP 108/68 | HR 80 | Temp 97.1°F | Resp 16 | Ht 67.0 in | Wt 153.8 lb

## 2011-12-14 DIAGNOSIS — D235 Other benign neoplasm of skin of trunk: Secondary | ICD-10-CM | POA: Insufficient documentation
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
## 2011-12-14 NOTE — Patient Instructions (Signed)
Call when you have recovered from your prostate procedure and want to schedule the procedure on your back.

## 2011-12-18 ENCOUNTER — Telehealth: Payer: Self-pay | Admitting: Cardiology

## 2011-12-18 ENCOUNTER — Encounter (HOSPITAL_COMMUNITY): Payer: Self-pay | Admitting: Pharmacy Technician

## 2011-12-18 NOTE — Telephone Encounter (Signed)
Chasiry from [facility]  Urology called regarding pt's surgical clearance for green light lased surgery. Request was send to this office on 12/12/11. Pt was seen last in this office on 11/02/11 by Dr. Riley Kill.   Left Chasity a message that this message will be send to MD's nurse desktop.

## 2011-12-18 NOTE — Telephone Encounter (Signed)
Pt is having green light laser surgery on prostate and surgical clearance was sent on 9/10 and they are calling to f/u cause they have not heard anything

## 2011-12-20 ENCOUNTER — Encounter (HOSPITAL_COMMUNITY): Payer: Self-pay

## 2011-12-20 ENCOUNTER — Encounter (HOSPITAL_COMMUNITY)
Admission: RE | Admit: 2011-12-20 | Discharge: 2011-12-20 | Disposition: A | Discharge status: Home / Self Care | Payer: Medicare Other | Source: Ambulatory Visit | Attending: Urology | Admitting: Urology

## 2011-12-20 DIAGNOSIS — R490 Dysphonia: Secondary | ICD-10-CM

## 2011-12-20 HISTORY — DX: Dysphonia: R49.0

## 2011-12-20 LAB — SURGICAL PCR SCREEN
MRSA, PCR: NEGATIVE
Staphylococcus aureus: POSITIVE — AB

## 2011-12-20 LAB — CBC
HCT: 38.1 % — ABNORMAL LOW (ref 39.0–52.0)
Hemoglobin: 12.7 g/dL — ABNORMAL LOW (ref 13.0–17.0)
MCH: 29.4 pg (ref 26.0–34.0)
MCHC: 33.3 g/dL (ref 30.0–36.0)
MCV: 88.2 fL (ref 78.0–100.0)
Platelets: 356 10*3/uL (ref 150–400)
RBC: 4.32 MIL/uL (ref 4.22–5.81)
RDW: 12.6 % (ref 11.5–15.5)
WBC: 6.9 10*3/uL (ref 4.0–10.5)

## 2011-12-20 LAB — BASIC METABOLIC PANEL
BUN: 21 mg/dL (ref 6–23)
CO2: 29 mEq/L (ref 19–32)
Calcium: 10.2 mg/dL (ref 8.4–10.5)
Chloride: 94 mEq/L — ABNORMAL LOW (ref 96–112)
Creatinine, Ser: 1.62 mg/dL — ABNORMAL HIGH (ref 0.50–1.35)
GFR calc Af Amer: 49 mL/min — ABNORMAL LOW (ref >90)
GFR calc non Af Amer: 42 mL/min — ABNORMAL LOW (ref >90)
Glucose, Bld: 108 mg/dL — ABNORMAL HIGH (ref 70–99)
Potassium: 4.2 mEq/L (ref 3.5–5.1)
Sodium: 135 mEq/L (ref 135–145)

## 2011-12-20 NOTE — Telephone Encounter (Signed)
I spoke with Gerald Rosario and made her aware that Dr Riley Kill is in the office today and will review clearance request.

## 2011-12-20 NOTE — Telephone Encounter (Signed)
F/U  Per Chasity calling checking on status of cardiac clearance.

## 2011-12-20 NOTE — Patient Instructions (Addendum)
20 Gerald Rosario  12/20/2011   Your procedure is scheduled on:  9-24 -2013  Report to [facility] at  0530      AM ..  Call this number if you have problems the morning of surgery: [phone]  Or Presurgical Testing [phone]([name])   Remember:   Do not eat food:After Midnight.    Take these medicines the morning of surgery with A SIP OF WATER: Metoprolol, Protonix, Isosorbide, Pravastatin, Tramadol. Use Advair and bring. Bring Lantus insulin.  No Insulin AM of- take Lantus insulin 1/2 dose of PM(use 7 units) subcutaneously on 12-25-11.   Do not wear jewelry, make-up or nail polish.  Do not wear lotions, powders, or perfumes. You may wear deodorant.  Do not shave 48 hours prior to surgery.(face and neck okay, no shaving of legs)  Do not bring valuables to the hospital.  Contacts, dentures or bridgework may not be worn into surgery.  Leave suitcase in the car. After surgery it may be brought to your room.  For patients admitted to the hospital, checkout time is 11:00 AM the day of discharge.   Patients discharged the day of surgery will not be allowed to drive home. Must have responsible person with you x 24 hours once discharged.  Name and phone number of your driver: will have a neighbor-will bring phone # day of.  Special Instructions: CHG Shower Use Special Wash: 1/2 bottle night before surgery and 1/2 bottle morning of surgery.(avoid face and genitals)-see other instruction sheet.   Please read over the following fact sheets that you were given: MRSA Information.

## 2011-12-20 NOTE — Pre-Procedure Instructions (Addendum)
12-20-11 EKG 8'13, CXR 4'13- Epic. 12-20-11 1500 Pt. Notified of Positive PCR screen for Staph aureus-will fill RX. Mupirocin oint. And use as directed. W. Kennon Portela

## 2011-12-21 ENCOUNTER — Encounter: Payer: Self-pay | Admitting: Cardiology

## 2011-12-21 ENCOUNTER — Ambulatory Visit (INDEPENDENT_AMBULATORY_CARE_PROVIDER_SITE_OTHER): Payer: Medicare Other | Admitting: Cardiology

## 2011-12-21 VITALS — BP 108/66 | HR 70 | Ht 67.0 in | Wt 153.0 lb

## 2011-12-21 DIAGNOSIS — I2589 Other forms of chronic ischemic heart disease: Secondary | ICD-10-CM

## 2011-12-21 DIAGNOSIS — I2510 Atherosclerotic heart disease of native coronary artery without angina pectoris: Secondary | ICD-10-CM

## 2011-12-21 DIAGNOSIS — I255 Ischemic cardiomyopathy: Secondary | ICD-10-CM
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
## 2011-12-21 NOTE — Assessment & Plan Note (Signed)
The patient is stable from a cardiac standpoint.  He is not having angina, and is able to do well over 4 mets of activity without symptoms.  He is about to undergo an intermediate risk procedure.  He can come off ASA.  He is at some increased risk due to his underlying cardiac disease, but it is necessary, he is not unstable, nor does he have active CHF, aortic stenosis, or arrhythmias.  I agree with the plans for overnight observation, and he should have careful attention to fluid detials during and after the surgery.   We will be available as a team for problems although I will personally be out of town during that time.  The patient understands clearly that because of his cardiac baseline he is at increased risk, and wants to proceed.

## 2011-12-21 NOTE — Assessment & Plan Note (Signed)
Currently stable.

## 2011-12-21 NOTE — Telephone Encounter (Signed)
Per Dr Riley Kill he would like to evaluate the pt in the office today for surgical clearance. Pt will be seen at 12:45.

## 2011-12-25 NOTE — Anesthesia Preprocedure Evaluation (Signed)
Anesthesia Evaluation  Patient identified by MRN, date of birth, ID band Patient awake    Reviewed: Allergy & Precautions, H&P , NPO status , Patient's Chart, lab work & pertinent test results  Airway Mallampati: II TM Distance: >3 FB Neck ROM: Full    Dental No notable dental hx.    Pulmonary shortness of breath, asthma ,  breath sounds clear to auscultation  Pulmonary exam normal       Cardiovascular Exercise Tolerance: Good hypertension, Pt. on medications and Pt. on home beta blockers + CAD, + Past MI and +CHF Rhythm:Regular Rate:Normal  Cardiologist note from 12/20/11 reviewed. EF 33% on MRI. Stable. H/O silent MI.  ECG and CXR reviewed.   Neuro/Psych negative neurological ROS  negative psych ROS   GI/Hepatic negative GI ROS, Neg liver ROS,   Endo/Other  negative endocrine ROSdiabetes, Type 1, Insulin Dependent  Renal/GU Renal InsufficiencyRenal diseaseH/o hyperkalemia.  negative genitourinary   Musculoskeletal negative musculoskeletal ROS (+)   Abdominal   Peds negative pediatric ROS (+)  Hematology negative hematology ROS (+)   Anesthesia Other Findings   Reproductive/Obstetrics negative OB ROS                           Anesthesia Physical Anesthesia Plan  ASA: III  Anesthesia Plan: General   Post-op Pain Management:    Induction: Intravenous  Airway Management Planned: LMA  Additional Equipment:   Intra-op Plan:   Post-operative Plan: Extubation in OR  Informed Consent: I have reviewed the patients History and Physical, chart, labs and discussed the procedure including the risks, benefits and alternatives for the proposed anesthesia with the patient or authorized representative who has indicated his/her understanding and acceptance.   Dental advisory given  Plan Discussed with: CRNA  Anesthesia Plan Comments:         Anesthesia Quick Evaluation

## 2011-12-25 NOTE — H&P (Signed)
History of Present Illness           F/u BPH and urinary retention. Referred by Dr. Brunilda Payor for BPH and urinary retention. Gerald Rosario was seen Mar 2013. CT scan on Feb 2013 for evaluation of venous obstruction showed markedly distended bladder, normal kidneys. Pt was having leakage at night. Bladder distention was also noted on a CT scan in July 2012.  PVR was greater than 1300 ml. He was started on Rapafo. Renal ultrasound revealed no hydronephrosis.  He was catheterized for 2300 ml and now has an indwelling Foley catheter for chronic urinary retention.   May 2013 Prostate ultrasound showed a volume of 47 ml. Apr 2013 Cystoscopy showed prostatic hypertrophy (trilobar with a median lobe).   Urodynamics May 2013 studies showed large capacity hyposensitive bladder with weak, unsustained contraction for voiding. Dr. Brunilda Payor discussed long-term management options with him: Foley versus S/P tube.  He would like to keep the Foley instead instead of an S/P tube.  He would also like to have Green Light laser surgery.   He sees Dr. Riley Kill for his heart and will not need a pacer or defibrillator. His electrical studies were normal. He needed no heart stents.   Interval Hx He returns and has an indwelling foley. Dr. Brunilda Payor noted a median lobe on cystoscopy, and I recommended we repeat cystoscopy today before we commit to Greenlight PVP. He elects to proceed.    Past Medical History Problems  1. History of  Acute Myocardial Infarction V12.59 2. History of  Asthma 493.90 3. History of  Atrial Fibrillation 427.31 4. History of  Diabetes Mellitus 250.00 5. History of  Heart Disease 429.9 6. History of  Heartburn 787.1 7. History of  Hypercholesterolemia 272.0 8. History of  Hypertension 401.9  Surgical History Problems  1. History of  Arm Incision Left 2. History of  Hand Surgery Left 3. History of  Hemorrhoidectomy  Current Meds 1. Advair HFA 115-21 MCG/ACT Inhalation Aerosol; Therapy: 03Feb2013 to 2.  Fish Oil CAPS; Therapy: (Recorded:22Mar2013) to 3. HydrALAZINE HCl 10 MG Oral Tablet; Therapy: (Recorded:22Mar2013) to 4. Isosorbide Mononitrate ER 30 MG Oral Tablet Extended Release 24 Hour; Therapy: 07Jan2013 to 5. Lantus SoloStar SOLN; Therapy: (Recorded:22Mar2013) to 6. Lasix 40 MG Oral Tablet; Therapy: (Recorded:22Mar2013) to 7. Lidocaine HCl 2 % External Gel; Therapy: 07May2013 to 8. MetFORMIN HCl 500 MG Oral Tablet; Therapy: 07May2013 to 9. Metoprolol Tartrate 25 MG Oral Tablet; Therapy: 25Jul2012 to 10. Multi-Vitamin TABS; Therapy: (Recorded:22Mar2013) to 11. Protonix 40 MG Oral Tablet Delayed Release; Therapy: (Recorded:22Mar2013) to 12. Ultram 50 MG Oral Tablet; Therapy: (Recorded:22Mar2013) to  Allergies Medication  1. Sulfa Drugs  Family History Problems  1. Maternal history of  Acute Myocardial Infarction V17.3 2. Family history of  Family Health Status Number Of Children 1 daughter 3. Family history of  Father Deceased At Age ____ heart attack 4. Paternal history of  Malignant Clear Cell Type Neoplasm Of The Rectum V16.0 5. Family history of  Mother Deceased At Age 83 rectal ca 6. Family history of  Renal Failure  Social History Problems  1. Alcohol Use 5-6 a month 2. Caffeine Use 4 3. Marital History - Divorced V61.03 4. Tobacco Use 305.1 2 pke for 18 yrs / quit 1982  Review of Systems Constitutional, otolaryngeal, cardiovascular, pulmonary, musculoskeletal, gastrointestinal and neurological system(s) were reviewed and pertinent findings if present are noted.    Vitals Vital Signs [Data Includes: Last 1 Day]  09Sep2013 01:14PM  Blood Pressure: 107 / 67  Temperature: 97.5 F Heart Rate: 74  Physical Exam Constitutional: Well nourished and well developed . No acute distress.  Pulmonary: No respiratory distress and normal respiratory rhythm and effort.  Cardiovascular: Heart rate and rhythm are normal . No peripheral edema.  Neuro/Psych:. Mood and affect  are appropriate.    Results/Data Urine [Data Includes: Last 1 Day]   09Sep2013  COLOR YELLOW   APPEARANCE CLEAR   SPECIFIC GRAVITY 1.010   pH 6.0   GLUCOSE NEG mg/dL  BILIRUBIN NEG   KETONE NEG mg/dL  BLOOD NEG   PROTEIN NEG mg/dL  UROBILINOGEN 0.2 mg/dL  NITRITE NEG   LEUKOCYTE ESTERASE LARGE   SQUAMOUS EPITHELIAL/HPF RARE   WBC 20-30 WBC/hpf  RBC 0-2 RBC/hpf  BACTERIA MANY   CRYSTALS NONE SEEN   CASTS NONE SEEN    Procedure  Procedure: Cystoscopy   Informed Consent: Risks, benefits, and potential adverse events were discussed and informed consent was obtained from the patient.  Prep: The patient was prepped with betadine.  Procedure Note:  Urethral meatus:. No abnormalities.  Anterior urethra: No abnormalities.  Prostatic urethra: No abnormalities . The lateral prostatic lobes were enlarged.  Bladder: Visulization was clear. The ureteral orifices were in the normal anatomic position bilaterally and had clear efflux of urine. A systematic survey of the bladder demonstrated no bladder tumors or stones. The mucosa was smooth without abnormalities. The patient tolerated the procedure well.  Bladder filled to 250 ml and he could not void.  Complications: None.    Assessment Assessed  1. Benign Prostatic Hypertrophy With Urinary Obstruction With Other Lower Urinary Tract Symptoms  600.01 2. Acute Urinary Retention 788.20 3. 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   Plan Health Maintenance (V70.0)  1. UA With REFLEX  Done: 09Sep2013 01:04PM Hypotonic Bladder (596.4), Benign Prostatic Hypertrophy With Urinary Obstruction With Other Lower Urinary Tract Symptoms (600.01)  2. Follow-up Schedule Surgery Office  Follow-up  Requested for: 09Sep2013 Incomplete Emptying Of Bladder (788.21), Benign Prostatic Hypertrophy With Urinary Obstruction With Other Lower Urinary Tract Symptoms (600.01)  3. Cysto  Done:  09Sep2013  Discussion/Summary        I discussed with him treatment for BPH and BOO and the nature risks and benefits of continued foley, CIC, alpha blockers, 5 alpha reductase inhibitors, and office or OR based procedures. All questions answered. Patient elects to proceed with Laser ablation of prostate. We discussed again there is a high likelihood of going to need to continue CIC following surgery. We discussed risks of bleeding, infection, persistent retention, incontinence, strictures, fluid overload, thrombotic events, anesthetic complications, worsening/de novo urgency, and sexual dysfunction (impotence/retrograde ejaculation) among others. We talked about the usual good succes rate with prostate procedures, but overactive bladder symptoms could settle down in 65 percent of the cases, persist in a third and worsen or start in 2 to 3 percent. We discussed treatment goals may not be reached and/or may require multiple procedures. We discussed the likelihood of achieving the goals of the procedure and potential problems that might occur during the procedure or recuperation.      Signatures Electronically signed by : Jerilee Field, M.D.; Dec 11 2011  2:39PM

## 2011-12-25 NOTE — H&P (Deleted)
History of Present Illness            F/u LUTS.   1 - urgency - referred by Dr. Dohmeier Jul 2013. Bothersome daytime urgency and barely makes it to the bathroom. He has no frequency. He voids with a good stream. He has no dysuria or hematuria. He feels his bladder is empty. Nocturia 3-7. He doesn't void much urine with each trip. He is on tamsulosin for a couple of years. He had a sleep study and did not have sleep apnea. He has occasional LE edema. He saw Dr. Davis with virtually the same history in 2010 and was started on tamsulosin. He was given samples of Vesicare, but doesnt recall if he took it.    He as significant issues with RLS and has been on Mirapax and Requip. He is was given imipramine and took it for a month but says he did not refill. He noticed no difference in LUTS.  Interval Hx He returns to review his UDS and for cystoscopy.   Aug 2013 UDS - small capacity unstable detrusor. No leakage but voided off an unstable contraction. EMG up slightly due to straining. Obstructed flow pattern, PVR 40 ml.  Most bothersome symptoms are urgency, nocturia and weak stream.   Past Medical History Problems  1. History of  Anxiety (Symptom) 300.00 2. History of  Arthritis V13.4 3. History of  Depression 311 4. History of  Gastric Ulcer 531.90 5. History of  Hypertension 401.9 6. History of  Hypertension 401.9  Surgical History Problems  1. History of  Back Surgery 2. History of  Inguinal Hernia Repair  Current Meds 1. Benicar HCT 40-12.5 MG Oral Tablet; Therapy: 07Jul2012 to 2. Gabapentin 600 MG Oral Tablet; Therapy: 05Feb2013 to 3. Imipramine HCl 10 MG Oral Tablet; Therapy: 17Jun2013 to 4. Requip XL 4 MG Oral Tablet Extended Release 24 Hour; Therapy: 08May2013 to 5. Tamsulosin HCl 0.4 MG Oral Capsule; Therapy: 08Apr2013 to  Allergies Medication  1. No Known Drug Allergies  Family History Problems  1. Paternal history of  Congestive Heart Failure 2. Family history of   Death In The Family Mother 3. Family history of  Family Health Status - Father's Age Died from CHF 4. Family history of  Family Health Status - Mother's Age Died from 86 from heart failure 5. Family history of  Family Health Status Number Of Children 5 children 6. Maternal history of  Heart Disease V17.49 7. Paternal history of  Hypertension V17.49 8. Paternal history of  Renal Disorder  Social History Problems  1. Alcohol Use 3 per week 2. Marital History - Currently Married 3. Never A Smoker Denied  4. History of  Caffeine Use 5. History of  Former Smoker  Review of Systems Constitutional, skin, eye, otolaryngeal, hematologic/lymphatic, cardiovascular, pulmonary, endocrine, musculoskeletal, gastrointestinal, neurological and psychiatric system(s) were reviewed and pertinent findings if present are noted.    Physical Exam Constitutional: Well nourished and well developed . No acute distress.  Pulmonary: No respiratory distress and normal respiratory rhythm and effort.  Cardiovascular: Heart rate and rhythm are normal . No peripheral edema.  Skin: Normal skin turgor, no visible rash and no visible skin lesions.  Neuro/Psych:. Mood and affect are appropriate.    Results/Data Urine [Data Includes: Last 1 Day]   09Sep2013  COLOR YELLOW   APPEARANCE CLEAR   SPECIFIC GRAVITY 1.025   pH 6.0   GLUCOSE NEG mg/dL  BILIRUBIN NEG   KETONE NEG mg/dL  BLOOD NEG     PROTEIN NEG mg/dL  UROBILINOGEN 0.2 mg/dL  NITRITE NEG   LEUKOCYTE ESTERASE NEG    Procedure  Procedure: Cystoscopy   Informed Consent: Risks, benefits, and potential adverse events were discussed and informed consent was obtained from the patient.  Prep: The patient was prepped with betadine.  Procedure Note:  Urethral meatus:. No abnormalities.  Anterior urethra: No abnormalities.  Prostatic urethra: No abnormalities . The lateral prostatic lobes were enlarged.  Bladder: Visulization was clear. The ureteral  orifices were in the normal anatomic position bilaterally and had clear efflux of urine. A systematic survey of the bladder demonstrated no bladder tumors or stones. The mucosa was smooth without abnormalities. Examination of the bladder demonstrated mild trabeculation. The patient tolerated the procedure well.  Complications: None.    Assessment Assessed  1. Benign Prostatic Hypertrophy 600.00 2. Feelings Of Urinary Urgency 788.63      Unstable bladder with obs flow pattern and BPH with BOO on cysto   Plan Bladder Neck Obstruction (596.0), Benign Prostatic Hypertrophy (600.00), Feelings Of Urinary Urgency (788.63)  1. Follow-up Schedule Surgery Office  Follow-up  Requested for: 09Sep2013 Health Maintenance (V70.0)  2. UA With REFLEX  Done: 09Sep2013 10:42AM  Discussion/Summary     Using the Understanding prostate problems booklet we discussed the treatment of BPH/BOO and lower urinary tract symptoms including the nature risks and benefits of behavioral therapy, physical therapy, alpha blockers, 5 alpha reductase inhibitors, anticholinergics, office or OR based procedures. All questions answered. He wouldlike to proceed with Greenlight PVP. He really wants to be off medicine and proceed with an efficient, durable procedure. We discussed risks of bleeding, infection, retention, incontinence, strictures, fluid overload, thrombotic events, anesthetic complications, worsening/de novo urgency and frequency, and sexual dysfunction (impotence/retrograde ejaculation) among others. We talked about the 85 to 90 percent success rate with flow symptoms, but overactive bladder symptoms could settle down in 65 percent of the cases, persist in a third and worsen in 2 to 3 percent. We discussed treatment goals may not be reached and/or may require multiple procedures. We discussed the likelihood of achieving the goals of the procedure and potential problems that might occur during the procedure or  recuperation.     Signatures Electronically signed by : Siboney Requejo, M.D.; Dec 11 2011 12:19PM   

## 2011-12-26 ENCOUNTER — Encounter (HOSPITAL_COMMUNITY)
Admission: RE | Disposition: A | Discharge status: Home / Self Care | Payer: Self-pay | Source: Ambulatory Visit | Attending: Urology

## 2011-12-26 ENCOUNTER — Ambulatory Visit (HOSPITAL_COMMUNITY): Payer: Medicare Other | Admitting: Anesthesiology

## 2011-12-26 ENCOUNTER — Encounter (HOSPITAL_COMMUNITY): Payer: Self-pay | Admitting: *Deleted

## 2011-12-26 ENCOUNTER — Encounter (HOSPITAL_COMMUNITY): Payer: Self-pay | Admitting: Anesthesiology

## 2011-12-26 ENCOUNTER — Ambulatory Visit (HOSPITAL_COMMUNITY)
Admission: RE | Admit: 2011-12-26 | Discharge: 2011-12-27 | Disposition: A | Discharge status: Home / Self Care | Payer: Medicare Other | Source: Ambulatory Visit | Attending: Urology | Admitting: Urology

## 2011-12-26 DIAGNOSIS — N312 Flaccid neuropathic bladder, not elsewhere classified: Secondary | ICD-10-CM | POA: Insufficient documentation

## 2011-12-26 DIAGNOSIS — R339 Retention of urine, unspecified: Secondary | ICD-10-CM | POA: Insufficient documentation

## 2011-12-26 DIAGNOSIS — R12 Heartburn: Secondary | ICD-10-CM | POA: Insufficient documentation

## 2011-12-26 DIAGNOSIS — N401 Enlarged prostate with lower urinary tract symptoms: Secondary | ICD-10-CM | POA: Insufficient documentation

## 2011-12-26 DIAGNOSIS — N138 Other obstructive and reflux uropathy: Secondary | ICD-10-CM | POA: Insufficient documentation

## 2011-12-26 DIAGNOSIS — I10 Essential (primary) hypertension: Secondary | ICD-10-CM | POA: Insufficient documentation

## 2011-12-26 DIAGNOSIS — I252 Old myocardial infarction: Secondary | ICD-10-CM | POA: Insufficient documentation

## 2011-12-26 DIAGNOSIS — J45909 Unspecified asthma, uncomplicated: Secondary | ICD-10-CM | POA: Insufficient documentation

## 2011-12-26 DIAGNOSIS — E7800 Pure hypercholesterolemia, unspecified: Secondary | ICD-10-CM | POA: Insufficient documentation

## 2011-12-26 DIAGNOSIS — I4891 Unspecified atrial fibrillation: Secondary | ICD-10-CM | POA: Insufficient documentation

## 2011-12-26 DIAGNOSIS — E119 Type 2 diabetes mellitus without complications: Secondary | ICD-10-CM | POA: Insufficient documentation

## 2011-12-26 DIAGNOSIS — I2510 Atherosclerotic heart disease of native coronary artery without angina pectoris: Secondary | ICD-10-CM

## 2011-12-26 DIAGNOSIS — Z79899 Other long term (current) drug therapy: Secondary | ICD-10-CM | POA: Insufficient documentation

## 2011-12-26 DIAGNOSIS — Z01812 Encounter for preprocedural laboratory examination: Secondary | ICD-10-CM | POA: Insufficient documentation

## 2011-12-26 HISTORY — PX: TRANSURETHRAL RESECTION OF PROSTATE: SHX73

## 2011-12-26 LAB — GLUCOSE, CAPILLARY
Glucose-Capillary: 82 mg/dL (ref 70–99)
Glucose-Capillary: 75 mg/dL (ref 70–99)
Glucose-Capillary: 96 mg/dL (ref 70–99)

## 2011-12-26 SURGERY — TURP (TRANSURETHRAL RESECTION OF PROSTATE)
Anesthesia: General | Wound class: Clean Contaminated

## 2011-12-26 MED ORDER — PANTOPRAZOLE SODIUM 40 MG PO TBEC
40.0000 mg | DELAYED_RELEASE_TABLET | Freq: Every day | ORAL | Status: DC
  Administered 2011-12-26 – 2011-12-27 (×2): 40 mg via ORAL
  Filled 2011-12-26 (×2): qty 1

## 2011-12-26 MED ORDER — MIDAZOLAM HCL 5 MG/5ML IJ SOLN
INTRAMUSCULAR | Status: DC | PRN
  Administered 2011-12-26 (×2): 1 mg via INTRAVENOUS

## 2011-12-26 MED ORDER — CEFAZOLIN SODIUM-DEXTROSE 2-3 GM-% IV SOLR
INTRAVENOUS | Status: AC
  Filled 2011-12-26: qty 50

## 2011-12-26 MED ORDER — CEFAZOLIN SODIUM-DEXTROSE 2-3 GM-% IV SOLR
2.0000 g | INTRAVENOUS | Status: AC
  Administered 2011-12-26: 2 g via INTRAVENOUS

## 2011-12-26 MED ORDER — SODIUM CHLORIDE 0.9 % IR SOLN
Status: DC | PRN
  Administered 2011-12-26: 14000 mL via INTRAVESICAL

## 2011-12-26 MED ORDER — ISOSORBIDE MONONITRATE ER 30 MG PO TB24
30.0000 mg | ORAL_TABLET | Freq: Every day | ORAL | Status: DC
  Administered 2011-12-26 – 2011-12-27 (×2): 30 mg via ORAL
  Filled 2011-12-26 (×2): qty 1

## 2011-12-26 MED ORDER — HYDROMORPHONE HCL PF 1 MG/ML IJ SOLN: 0.2500 mg | INTRAMUSCULAR | Status: DC | PRN

## 2011-12-26 MED ORDER — HYDROCODONE-ACETAMINOPHEN 5-325 MG PO TABS: 1.0000 | ORAL_TABLET | ORAL | Status: DC | PRN

## 2011-12-26 MED ORDER — HYOSCYAMINE SULFATE 0.125 MG SL SUBL
0.1250 mg | SUBLINGUAL_TABLET | SUBLINGUAL | Status: DC | PRN
  Filled 2011-12-26: qty 1

## 2011-12-26 MED ORDER — ASPIRIN EC 81 MG PO TBEC
81.0000 mg | DELAYED_RELEASE_TABLET | Freq: Every day | ORAL | Status: DC
  Administered 2011-12-27: 81 mg via ORAL
  Filled 2011-12-26 (×2): qty 1

## 2011-12-26 MED ORDER — SIMVASTATIN 20 MG PO TABS
20.0000 mg | ORAL_TABLET | Freq: Every day | ORAL | Status: DC
  Administered 2011-12-26: 20 mg via ORAL
  Filled 2011-12-26 (×2): qty 1

## 2011-12-26 MED ORDER — NEOSTIGMINE METHYLSULFATE 1 MG/ML IJ SOLN
INTRAMUSCULAR | Status: DC | PRN
  Administered 2011-12-26: 4 mg via INTRAVENOUS

## 2011-12-26 MED ORDER — GLYCOPYRROLATE 0.2 MG/ML IJ SOLN
INTRAMUSCULAR | Status: DC | PRN
  Administered 2011-12-26: 0.6 mg via INTRAVENOUS

## 2011-12-26 MED ORDER — METOPROLOL TARTRATE 25 MG PO TABS
25.0000 mg | ORAL_TABLET | Freq: Two times a day (BID) | ORAL | Status: DC
  Administered 2011-12-26 – 2011-12-27 (×2): 25 mg via ORAL
  Filled 2011-12-26 (×3): qty 1

## 2011-12-26 MED ORDER — TAMSULOSIN HCL 0.4 MG PO CAPS: 0.4000 mg | ORAL_CAPSULE | Freq: Every day | ORAL | Status: DC

## 2011-12-26 MED ORDER — PROMETHAZINE HCL 25 MG/ML IJ SOLN: 6.2500 mg | INTRAMUSCULAR | Status: DC | PRN

## 2011-12-26 MED ORDER — ACETAMINOPHEN 10 MG/ML IV SOLN
INTRAVENOUS | Status: AC
  Filled 2011-12-26: qty 100

## 2011-12-26 MED ORDER — LIDOCAINE HCL (CARDIAC) 20 MG/ML IV SOLN
INTRAVENOUS | Status: DC | PRN
  Administered 2011-12-26: 80 mg via INTRAVENOUS

## 2011-12-26 MED ORDER — DEXTROSE-NACL 5-0.9 % IV SOLN: INTRAVENOUS | Status: DC

## 2011-12-26 MED ORDER — BELLADONNA ALKALOIDS-OPIUM 16.2-60 MG RE SUPP
RECTAL | Status: DC | PRN
  Administered 2011-12-26: 1 via RECTAL

## 2011-12-26 MED ORDER — LACTATED RINGERS IV SOLN: INTRAVENOUS | Status: DC

## 2011-12-26 MED ORDER — FUROSEMIDE 40 MG PO TABS
40.0000 mg | ORAL_TABLET | Freq: Two times a day (BID) | ORAL | Status: DC
  Administered 2011-12-27: 40 mg via ORAL
  Filled 2011-12-26 (×3): qty 1

## 2011-12-26 MED ORDER — BACITRACIN-NEOMYCIN-POLYMYXIN 400-5-5000 EX OINT: 1.0000 "application " | TOPICAL_OINTMENT | Freq: Three times a day (TID) | CUTANEOUS | Status: DC | PRN

## 2011-12-26 MED ORDER — LIDOCAINE HCL 2 % EX GEL
CUTANEOUS | Status: AC
  Filled 2011-12-26: qty 10

## 2011-12-26 MED ORDER — ONDANSETRON HCL 4 MG/2ML IJ SOLN
INTRAMUSCULAR | Status: DC | PRN
  Administered 2011-12-26: 4 mg via INTRAVENOUS

## 2011-12-26 MED ORDER — ASPIRIN 81 MG PO TABS: 81.0000 mg | ORAL_TABLET | Freq: Every morning | ORAL | Status: DC

## 2011-12-26 MED ORDER — METOPROLOL TARTRATE 25 MG PO TABS
25.0000 mg | ORAL_TABLET | Freq: Once | ORAL | Status: AC
  Administered 2011-12-26: 25 mg via ORAL
  Filled 2011-12-26: qty 1

## 2011-12-26 MED ORDER — OXYCODONE-ACETAMINOPHEN 5-325 MG PO TABS
1.0000 | ORAL_TABLET | ORAL | Status: DC | PRN
Start: 2011-12-26 — End: 2012-01-16

## 2011-12-26 MED ORDER — INSULIN GLARGINE 100 UNIT/ML ~~LOC~~ SOLN
15.0000 [IU] | Freq: Every day | SUBCUTANEOUS | Status: DC
  Administered 2011-12-26: 15 [IU] via SUBCUTANEOUS

## 2011-12-26 MED ORDER — ROCURONIUM BROMIDE 100 MG/10ML IV SOLN
INTRAVENOUS | Status: DC | PRN
  Administered 2011-12-26: 40 mg via INTRAVENOUS

## 2011-12-26 MED ORDER — LIDOCAINE HCL 2 % EX GEL
CUTANEOUS | Status: DC | PRN
  Administered 2011-12-26: 1 via URETHRAL

## 2011-12-26 MED ORDER — DOCUSATE SODIUM 100 MG PO CAPS
100.0000 mg | ORAL_CAPSULE | Freq: Two times a day (BID) | ORAL | Status: DC
  Administered 2011-12-26 – 2011-12-27 (×2): 100 mg via ORAL
  Filled 2011-12-26 (×3): qty 1

## 2011-12-26 MED ORDER — METFORMIN HCL 500 MG PO TABS
500.0000 mg | ORAL_TABLET | Freq: Two times a day (BID) | ORAL | Status: DC
  Administered 2011-12-26 – 2011-12-27 (×2): 500 mg via ORAL
  Filled 2011-12-26 (×5): qty 1

## 2011-12-26 MED ORDER — PROPOFOL 10 MG/ML IV BOLUS
INTRAVENOUS | Status: DC | PRN
  Administered 2011-12-26: 160 mg via INTRAVENOUS

## 2011-12-26 MED ORDER — BELLADONNA ALKALOIDS-OPIUM 16.2-60 MG RE SUPP
RECTAL | Status: AC
  Filled 2011-12-26: qty 1

## 2011-12-26 MED ORDER — DOCUSATE SODIUM 100 MG PO CAPS: 100.0000 mg | ORAL_CAPSULE | Freq: Two times a day (BID) | ORAL | Status: DC

## 2011-12-26 MED ORDER — FLUTICASONE-SALMETEROL 115-21 MCG/ACT IN AERO: 2.0000 | INHALATION_SPRAY | Freq: Two times a day (BID) | RESPIRATORY_TRACT | Status: DC

## 2011-12-26 MED ORDER — LACTATED RINGERS IV SOLN
INTRAVENOUS | Status: DC | PRN
  Administered 2011-12-26 (×2): via INTRAVENOUS

## 2011-12-26 MED ORDER — ACETAMINOPHEN 10 MG/ML IV SOLN
INTRAVENOUS | Status: DC | PRN
  Administered 2011-12-26: 1000 mg via INTRAVENOUS

## 2011-12-26 MED ORDER — HYDRALAZINE HCL 10 MG PO TABS
10.0000 mg | ORAL_TABLET | Freq: Three times a day (TID) | ORAL | Status: DC
  Administered 2011-12-26 – 2011-12-27 (×2): 10 mg via ORAL
  Filled 2011-12-26 (×5): qty 1

## 2011-12-26 MED ORDER — CIPROFLOXACIN HCL 500 MG PO TABS
500.0000 mg | ORAL_TABLET | Freq: Two times a day (BID) | ORAL | Status: DC
  Administered 2011-12-26 – 2011-12-27 (×2): 500 mg via ORAL
  Filled 2011-12-26 (×5): qty 1

## 2011-12-26 MED ORDER — TRAMADOL HCL 50 MG PO TABS: 50.0000 mg | ORAL_TABLET | Freq: Four times a day (QID) | ORAL | Status: DC | PRN

## 2011-12-26 MED ORDER — FENTANYL CITRATE 0.05 MG/ML IJ SOLN
INTRAMUSCULAR | Status: DC | PRN
  Administered 2011-12-26 (×5): 50 ug via INTRAVENOUS

## 2011-12-26 MED ORDER — FLUTICASONE-SALMETEROL 115-21 MCG/ACT IN AERO
2.0000 | INHALATION_SPRAY | Freq: Two times a day (BID) | RESPIRATORY_TRACT | Status: DC
  Filled 2011-12-26: qty 8

## 2011-12-26 MED ORDER — CEPHALEXIN 250 MG PO CAPS: 250.0000 mg | ORAL_CAPSULE | Freq: Every day | ORAL | Status: DC

## 2011-12-26 SURGICAL SUPPLY — 30 items
BAG URINE DRAINAGE (UROLOGICAL SUPPLIES) ×2 IMPLANT
BAG URINE LEG 500ML (DRAIN) ×1 IMPLANT
BAG URO CATCHER STRL LF (DRAPE) ×2 IMPLANT
BLADE SURG 15 STRL LF DISP TIS (BLADE) IMPLANT
BLADE SURG 15 STRL SS (BLADE)
CATH FOLEY 2WAY SLVR 18FR 30CC (CATHETERS) ×1 IMPLANT
CATH FOLEY 3WAY 30CC 22FR (CATHETERS) ×1 IMPLANT
CATH URET 5FR 28IN OPEN ENDED (CATHETERS) ×1 IMPLANT
CLOTH BEACON ORANGE TIMEOUT ST (SAFETY) ×2 IMPLANT
CONT SPEC 4OZ CLIKSEAL STRL BL (MISCELLANEOUS) ×1 IMPLANT
DRAPE CAMERA CLOSED 9X96 (DRAPES) ×2 IMPLANT
ELECT REM PT RETURN 9FT ADLT (ELECTROSURGICAL)
ELECTRODE REM PT RTRN 9FT ADLT (ELECTROSURGICAL) ×1 IMPLANT
EVACUATOR MICROVAS BLADDER (UROLOGICAL SUPPLIES) ×1 IMPLANT
FEE RENTAL LASER GREENLIGHT (Laser) IMPLANT
GLOVE BIOGEL M STRL SZ7.5 (GLOVE) ×2 IMPLANT
GOWN PREVENTION PLUS XLARGE (GOWN DISPOSABLE) ×2 IMPLANT
GOWN STRL NON-REIN LRG LVL3 (GOWN DISPOSABLE) ×2 IMPLANT
GOWN STRL REIN XL XLG (GOWN DISPOSABLE) ×2 IMPLANT
GREENLIGHT MOXY FIBER OPTIC ×1 IMPLANT
HOLDER FOLEY CATH W/STRAP (MISCELLANEOUS) IMPLANT
KIT ASPIRATION TUBING (SET/KITS/TRAYS/PACK) ×1 IMPLANT
LASER FIBER /GREENLIGHT LASER (Laser) ×1 IMPLANT
LASER GREENLIGHT RENTAL P/PROC (Laser) ×2 IMPLANT
LOOPS RESECTOSCOPE DISP (ELECTROSURGICAL) ×1 IMPLANT
MANIFOLD NEPTUNE II (INSTRUMENTS) ×2 IMPLANT
PACK CYSTO (CUSTOM PROCEDURE TRAY) ×2 IMPLANT
SUT ETHILON 3 0 PS 1 (SUTURE) IMPLANT
SYR 30ML LL (SYRINGE) IMPLANT
TUBING CONNECTING 10 (TUBING) ×2 IMPLANT

## 2011-12-26 NOTE — Interval H&P Note (Signed)
History and Physical Interval Note:  12/26/2011 7:43 AM  Gerald Rosario  has presented today for surgery, with the diagnosis of Benign Prostatic Hypertrophy with Bladder Outlet Obstruction  The various methods of treatment have been discussed with the patient and family. After consideration of risks, benefits and other options for treatment, the patient has consented to  Procedure(s) (LRB) with comments: TRANSURETHRAL RESECTION OF THE PROSTATE (TURP) (N/A) - Greenlight PVP laser of Prostate HOLMIUM LASER APPLICATION (N/A) -   as a surgical intervention .  The patient's history has been reviewed, patient examined, no change in status, stable for surgery.  I have reviewed the patient's chart and labs. I explained again his bladder was not working on UDS and he may still need a foley or CIC post-op. Questions were answered to the patient's satisfaction. He elects to proceed. We'll keep the foley 2-3 weeks post-op.    Antony Haste

## 2011-12-26 NOTE — Transfer of Care (Signed)
Immediate Anesthesia Transfer of Care Note  Patient: Gerald Rosario  Procedure(s) Performed: Procedure(s) (LRB) with comments: TRANSURETHRAL RESECTION OF THE PROSTATE (TURP) (N/A) - Greenlight PVP laser of Prostate HOLMIUM LASER APPLICATION (N/A) -    Patient Location: PACU  Anesthesia Type: General  Level of Consciousness: awake, alert  and oriented  Airway & Oxygen Therapy: Patient Spontanous Breathing and Patient connected to face mask oxygen  Post-op Assessment: Report given to PACU RN and Post -op Vital signs reviewed and stable  Post vital signs: Reviewed and stable  Complications: No apparent anesthesia complications

## 2011-12-26 NOTE — Progress Notes (Signed)
Dr. Council Mechanic in- made aware of patient's EKG RHYTHMNS

## 2011-12-26 NOTE — Progress Notes (Signed)
Dr. Council Mechanic made aware of patient's CBG RESULTS- PATIENT DRANK 150 CC OF COKE.

## 2011-12-26 NOTE — Anesthesia Postprocedure Evaluation (Signed)
  Anesthesia Post-op Note  Patient: Gerald Rosario  Procedure(s) Performed: Procedure(s) (LRB): TRANSURETHRAL RESECTION OF THE PROSTATE (TURP) (N/A) HOLMIUM LASER APPLICATION (N/A)  Patient Location: PACU  Anesthesia Type: General  Level of Consciousness: awake and alert   Airway and Oxygen Therapy: Patient Spontanous Breathing  Post-op Pain: mild  Post-op Assessment: Post-op Vital signs reviewed, Patient's Cardiovascular Status Stable, Respiratory Function Stable, Patent Airway and No signs of Nausea or vomiting  Post-op Vital Signs: stable  Complications: No apparent anesthesia complications. FBS 75

## 2011-12-26 NOTE — Op Note (Signed)
Preoperative diagnosis: #1 BPH #2 urinary retention #3 hypotonic bladder Postoperative diagnosis: Same Procedure: Greenlight photo vaporization of the prostate.  Surgeon: Mena Goes  Anesthesia: Gen.  Findings: EUA - there was a normal circumcised penis without lesion. Testicles were descended and normal without masses. On digital rectal exam the prostate was palpably normal without heart area or nodule. Cystoscopy - the bladder was inspected with the 30 and 70 lens and there is no evidence of urothelial carcinoma, foreign body or stone. Both ureteral orifice these were identified on the trigone in their normal location.  Description of procedure: After consent was obtained patient brought to the operating room. A timeout was performed to confirm the patient and procedure. After adequate anesthesia he is placed in lithotomy position and examined. A B&O suppository was placed per rectum. He was then prepped and draped in the usual sterile fashion. Using a cystoscope the bladder was inspected with the 30 and 70 lens. Next, the laser continuous-flow sheath was passed with the visual obturator. The laser bridge was then placed in the laser fiber introduced. Vaporization was begun at the lateral lobes at the bladder neck from top to bottom down to about the mid prostate bilaterally to create the working channel. Next the inferior was identified and vaporization carried down to the veru from top to bottom bilaterally. On May the bladder neck the prostate was vaporized down to bladder neck fibers mostly from 10:00 around to 2:00. Further vaporization was carried bilaterally and on the floor the prostate. It is primarily lateral lobe hypertrophy and no true middle lobe. After vaporization the view from the very revealed a wide-open channel from the bladder neck down through the prostatic urethra to the area with excellent hemostasis. The ureteral orifice these were inspected again and noted to be normal without  trauma. The scope was removed an 79 French Foley placed with the balloon inflated with 25 mL of water. The urine was clear.  Estimated blood loss: Minimal  Complications: None  Specimens: None  Drains: 18 French Foley  Disposition: Patient stable to PACU. Given his cardiac history he'll be kept overnight in observation.

## 2011-12-27 ENCOUNTER — Encounter (HOSPITAL_COMMUNITY): Payer: Self-pay | Admitting: Urology

## 2011-12-27 NOTE — Discharge Summary (Signed)
Physician Discharge Summary  Patient ID: FARAJ OBORNY MRN: [id number] DOB/AGE: 01-Oct-1945 66 y.o.  Admit date: 12/26/2011 Discharge date: 12/27/2011  Admission Diagnoses: BPH, Urinary retention  Discharge Diagnoses:  Same   Discharged Condition: good  Hospital Course: Pt admitted following Greenlight PVP Prostate due to chronic cardiac condition for observation. He has remained stable. Urine clear. D/c to home with foley to leg bag (pt does not want foley out yet, he has had it for four months).  Consults: None  Significant Diagnostic Studies:none  Treatments: surgery: Greenlight Laser photovaporization of prostate  Discharge Exam: Blood pressure 109/59, pulse 73, temperature 98.2 F (36.8 C), temperature source Oral, resp. rate 16, height 5\' 7"  (1.702 m), weight 69.4 kg (153 lb), SpO2 97.00%. A&Ox3 Sitting in bed, NAD Abd - soft, NT Urine clear  Disposition: 01-Home or Self Care  Discharge Orders    Future Appointments: Provider: Department: Dept Phone: Center:   01/16/2012 10:15 AM Herby Abraham, MD [facility] [phone] [facility]   04/04/2012 8:15 AM Sherrie George, MD [facility] [phone] None       Medication List     As of 12/27/2011  8:13 AM    TAKE these medications         aspirin 81 MG tablet   Take 81 mg by mouth every morning.      cephALEXin 250 MG capsule   Commonly known as: KEFLEX   Take 1 capsule (250 mg total) by mouth daily.      docusate sodium 100 MG capsule   Commonly known as: COLACE   Take 1 capsule (100 mg total) by mouth 2 (two) times daily.      fish oil-omega-3 fatty acids 1000 MG capsule   Take 1 g by mouth daily.      fluticasone-salmeterol 115-21 MCG/ACT inhaler   Commonly known as: ADVAIR HFA   Inhale 2 puffs into the lungs 2 (two) times daily.      furosemide 40 MG tablet   Commonly known as: LASIX   Take 40 mg by mouth 2 (two) times daily.      hydrALAZINE 10 MG tablet   Commonly known  as: APRESOLINE   Take 10 mg by mouth 3 (three) times daily.      isosorbide mononitrate 30 MG 24 hr tablet   Commonly known as: IMDUR   Take 30 mg by mouth every morning.      LANTUS SOLOSTAR 100 UNIT/ML injection   Generic drug: insulin glargine   Inject 15 Units into the skin every evening.      metFORMIN 500 MG tablet   Commonly known as: GLUCOPHAGE   Take 500 mg by mouth 2 (two) times daily with a meal.      metoprolol tartrate 25 MG tablet   Commonly known as: LOPRESSOR   Take 25 mg by mouth 2 (two) times daily.      multivitamin tablet   Take 1 tablet by mouth daily.      oxyCODONE-acetaminophen 5-325 MG per tablet   Commonly known as: PERCOCET/ROXICET   Take 1 tablet by mouth every 4 (four) hours as needed for pain.      pantoprazole 40 MG tablet   Commonly known as: PROTONIX   Take 40 mg by mouth daily.      pravastatin 40 MG tablet   Commonly known as: PRAVACHOL   Take 40 mg by mouth every morning.      Tamsulosin HCl 0.4 MG Caps  Commonly known as: FLOMAX   Take 1 capsule (0.4 mg total) by mouth daily after supper.      traMADol 50 MG tablet   Commonly known as: ULTRAM   Take 50 mg by mouth every 6 (six) hours as needed. For pain           Follow-up Information    Follow up with Mena Goes Lowella Petties, MD. (3 weeks)    Contact information:   [address] [facility] [employer] [facility] [place] [address] [phone]          Signed: Antony Haste 12/27/2011, 8:13 AM

## 2012-01-03 ENCOUNTER — Ambulatory Visit: Payer: Medicare Other | Admitting: Cardiology

## 2012-01-04 ENCOUNTER — Other Ambulatory Visit (INDEPENDENT_AMBULATORY_CARE_PROVIDER_SITE_OTHER): Payer: Self-pay | Admitting: General Surgery

## 2012-01-16 ENCOUNTER — Ambulatory Visit (INDEPENDENT_AMBULATORY_CARE_PROVIDER_SITE_OTHER): Payer: Medicare Other | Admitting: Cardiology

## 2012-01-16 ENCOUNTER — Encounter: Payer: Self-pay | Admitting: Cardiology

## 2012-01-16 VITALS — BP 106/72 | HR 75 | Ht 67.0 in | Wt 155.0 lb

## 2012-01-16 DIAGNOSIS — E7800 Pure hypercholesterolemia, unspecified: Secondary | ICD-10-CM

## 2012-01-16 DIAGNOSIS — I2589 Other forms of chronic ischemic heart disease: Secondary | ICD-10-CM

## 2012-01-16 DIAGNOSIS — I255 Ischemic cardiomyopathy: Secondary | ICD-10-CM

## 2012-01-16 DIAGNOSIS — I2510 Atherosclerotic heart disease of native coronary artery without angina pectoris: Secondary | ICD-10-CM

## 2012-01-16 LAB — BASIC METABOLIC PANEL
BUN: 23 mg/dL (ref 6–23)
CO2: 26 mEq/L (ref 19–32)
Calcium: 9.4 mg/dL (ref 8.4–10.5)
Chloride: 101 mEq/L (ref 96–112)
Creatinine, Ser: 1.3 mg/dL (ref 0.4–1.5)
GFR: 61.19 mL/min (ref >60.00)
Glucose, Bld: 107 mg/dL — ABNORMAL HIGH (ref 70–99)
Potassium: 3.5 mEq/L (ref 3.5–5.1)
Sodium: 139 mEq/L (ref 135–145)

## 2012-01-16 LAB — HEPATIC FUNCTION PANEL
ALT: 17 U/L (ref 0–53)
AST: 26 U/L (ref 0–37)
Albumin: 3.7 g/dL (ref 3.5–5.2)
Alkaline Phosphatase: 58 U/L (ref 39–117)
Bilirubin, Direct: 0.1 mg/dL (ref 0.0–0.3)
Total Bilirubin: 0.7 mg/dL (ref 0.3–1.2)
Total Protein: 7.6 g/dL (ref 6.0–8.3)

## 2012-01-16 LAB — LIPID PANEL
Cholesterol: 165 mg/dL (ref 0–200)
HDL: 45.8 mg/dL (ref >39.00)
LDL Cholesterol: 100 mg/dL — ABNORMAL HIGH (ref 0–99)
Total CHOL/HDL Ratio: 4
Triglycerides: 98 mg/dL (ref 0.0–149.0)
VLDL: 19.6 mg/dL (ref 0.0–40.0)

## 2012-01-16 NOTE — Assessment & Plan Note (Signed)
Appropriate time for lipid and liver.
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
## 2012-01-16 NOTE — Assessment & Plan Note (Signed)
He likely qualifies for an ICD, but he is reluctant and hopes that his EF improves with the combination of diet and exercise.  He has agreed to an echo in December, and if he continues below 35% he would agree to that.  He has a lot of reservations about this.

## 2012-01-16 NOTE — Patient Instructions (Addendum)
Your physician recommends that you schedule a follow-up appointment in: December 2013  Your physician has requested that you have an echocardiogram in December 2013. Echocardiography is a painless test that uses sound waves to create images of your heart. It provides your doctor with information about the size and shape of your heart and how well your heart's chambers and valves are working. This procedure takes approximately one hour. There are no restrictions for this procedure.  Your physician recommends that you continue on your current medications as directed. Please refer to the Current Medication list given to you today.  Your physician recommends that you have lab work today: BMP, LIPID and LIVER

## 2012-01-16 NOTE — Assessment & Plan Note (Addendum)
Continues on medical therapy. His ASA is on hold for nose bleeds which come and go.

## 2012-01-17 ENCOUNTER — Other Ambulatory Visit: Payer: Self-pay | Admitting: Cardiology

## 2012-01-17 MED ORDER — METOPROLOL TARTRATE 25 MG PO TABS: 25.0000 mg | ORAL_TABLET | Freq: Two times a day (BID) | ORAL | Status: DC

## 2012-01-23 ENCOUNTER — Other Ambulatory Visit: Payer: Self-pay | Admitting: *Deleted

## 2012-01-23 DIAGNOSIS — Z79899 Other long term (current) drug therapy: Secondary | ICD-10-CM

## 2012-01-23 DIAGNOSIS — E785 Hyperlipidemia, unspecified: Secondary | ICD-10-CM

## 2012-01-23 MED ORDER — PRAVASTATIN SODIUM 80 MG PO TABS: 80.0000 mg | ORAL_TABLET | Freq: Every morning | ORAL | Status: DC

## 2012-01-26 ENCOUNTER — Other Ambulatory Visit (INDEPENDENT_AMBULATORY_CARE_PROVIDER_SITE_OTHER): Payer: Medicare Other

## 2012-01-26 DIAGNOSIS — Z79899 Other long term (current) drug therapy: Secondary | ICD-10-CM

## 2012-01-26 LAB — BASIC METABOLIC PANEL
BUN: 13 mg/dL (ref 6–23)
CO2: 28 mEq/L (ref 19–32)
Calcium: 9.2 mg/dL (ref 8.4–10.5)
Chloride: 101 mEq/L (ref 96–112)
Creatinine, Ser: 1.3 mg/dL (ref 0.4–1.5)
GFR: 57.45 mL/min — ABNORMAL LOW (ref >60.00)
Glucose, Bld: 86 mg/dL (ref 70–99)
Potassium: 3.5 mEq/L (ref 3.5–5.1)
Sodium: 139 mEq/L (ref 135–145)

## 2012-01-29 NOTE — Progress Notes (Signed)
Pt instructed to bring insurance card and photo ID and be here at 11:30am.

## 2012-01-30 ENCOUNTER — Ambulatory Visit (HOSPITAL_BASED_OUTPATIENT_CLINIC_OR_DEPARTMENT_OTHER)
Admission: RE | Admit: 2012-01-30 | Discharge: 2012-01-30 | Disposition: A | Discharge status: Home / Self Care | Payer: Medicare Other | Source: Ambulatory Visit | Attending: General Surgery | Admitting: General Surgery

## 2012-01-30 ENCOUNTER — Telehealth: Payer: Self-pay | Admitting: Cardiology

## 2012-01-30 ENCOUNTER — Encounter (HOSPITAL_BASED_OUTPATIENT_CLINIC_OR_DEPARTMENT_OTHER)
Admission: RE | Disposition: A | Discharge status: Home / Self Care | Payer: Self-pay | Source: Ambulatory Visit | Attending: General Surgery

## 2012-01-30 DIAGNOSIS — N400 Enlarged prostate without lower urinary tract symptoms: Secondary | ICD-10-CM | POA: Insufficient documentation

## 2012-01-30 DIAGNOSIS — E785 Hyperlipidemia, unspecified: Secondary | ICD-10-CM | POA: Insufficient documentation

## 2012-01-30 DIAGNOSIS — H9190 Unspecified hearing loss, unspecified ear: Secondary | ICD-10-CM | POA: Insufficient documentation

## 2012-01-30 DIAGNOSIS — I2589 Other forms of chronic ischemic heart disease: Secondary | ICD-10-CM | POA: Insufficient documentation

## 2012-01-30 DIAGNOSIS — J45909 Unspecified asthma, uncomplicated: Secondary | ICD-10-CM | POA: Insufficient documentation

## 2012-01-30 DIAGNOSIS — D1739 Benign lipomatous neoplasm of skin and subcutaneous tissue of other sites: Secondary | ICD-10-CM

## 2012-01-30 DIAGNOSIS — I701 Atherosclerosis of renal artery: Secondary | ICD-10-CM | POA: Insufficient documentation

## 2012-01-30 DIAGNOSIS — I10 Essential (primary) hypertension: Secondary | ICD-10-CM | POA: Insufficient documentation

## 2012-01-30 DIAGNOSIS — I2510 Atherosclerotic heart disease of native coronary artery without angina pectoris: Secondary | ICD-10-CM

## 2012-01-30 DIAGNOSIS — E119 Type 2 diabetes mellitus without complications: Secondary | ICD-10-CM | POA: Insufficient documentation

## 2012-01-30 HISTORY — PX: LIPOMA EXCISION: SHX5283

## 2012-01-30 SURGERY — MINOR EXCISION LIPOMA
Anesthesia: LOCAL | Site: Back | Wound class: Clean

## 2012-01-30 MED ORDER — SODIUM BICARBONATE 4 % IV SOLN
INTRAVENOUS | Status: DC | PRN
  Administered 2012-01-30: 12:00:00 via INTRADERMAL

## 2012-01-30 SURGICAL SUPPLY — 30 items
APL SKNCLS STERI-STRIP NONHPOA (GAUZE/BANDAGES/DRESSINGS) ×1
BENZOIN TINCTURE PRP APPL 2/3 (GAUZE/BANDAGES/DRESSINGS) ×2 IMPLANT
BLADE SURG 10 STRL SS (BLADE) IMPLANT
BLADE SURG 15 STRL LF DISP TIS (BLADE) ×1 IMPLANT
BLADE SURG 15 STRL SS (BLADE) ×2
CHLORAPREP W/TINT 26ML (MISCELLANEOUS) ×2 IMPLANT
CLOTH BEACON ORANGE TIMEOUT ST (SAFETY) ×2 IMPLANT
DRSG TEGADERM 4X4.75 (GAUZE/BANDAGES/DRESSINGS) ×1 IMPLANT
ELECT COATED BLADE 2.86 ST (ELECTRODE) ×1 IMPLANT
ELECT REM PT RETURN 9FT ADLT (ELECTROSURGICAL) ×2
ELECTRODE REM PT RTRN 9FT ADLT (ELECTROSURGICAL) IMPLANT
GAUZE SPONGE 4X4 12PLY STRL LF (GAUZE/BANDAGES/DRESSINGS) ×2 IMPLANT
GAUZE SPONGE 4X4 16PLY XRAY LF (GAUZE/BANDAGES/DRESSINGS) ×1 IMPLANT
GLOVE BIOGEL PI IND STRL 7.0 (GLOVE) IMPLANT
GLOVE BIOGEL PI IND STRL 8.5 (GLOVE) ×1 IMPLANT
GLOVE BIOGEL PI INDICATOR 7.0 (GLOVE) ×1
GLOVE BIOGEL PI INDICATOR 8.5 (GLOVE) ×1
GLOVE ECLIPSE 8.0 STRL XLNG CF (GLOVE) ×2 IMPLANT
MARKER SKIN DUAL TIP RULER LAB (MISCELLANEOUS) ×1 IMPLANT
NDL HYPO 25X1 1.5 SAFETY (NEEDLE) ×1 IMPLANT
NEEDLE HYPO 25X1 1.5 SAFETY (NEEDLE) ×2 IMPLANT
PENCIL BUTTON HOLSTER BLD 10FT (ELECTRODE) ×1 IMPLANT
SPONGE GAUZE 4X4 12PLY (GAUZE/BANDAGES/DRESSINGS) ×1 IMPLANT
STRIP CLOSURE SKIN 1/2X4 (GAUZE/BANDAGES/DRESSINGS) ×2 IMPLANT
SUT MNCRL AB 3-0 PS2 18 (SUTURE) ×1 IMPLANT
SUT MON AB 4-0 PC3 18 (SUTURE) IMPLANT
SUT VIC AB 2-0 SH 27 (SUTURE) ×2
SUT VIC AB 2-0 SH 27XBRD (SUTURE) IMPLANT
SUT VICRYL 3-0 CR8 SH (SUTURE) IMPLANT
SYR CONTROL 10ML LL (SYRINGE) ×2 IMPLANT

## 2012-01-30 NOTE — Telephone Encounter (Signed)
I spoke with the pt and made him aware of BMP results.  At this time the pt does not want to start a potassium supplement.  The pt would like to go back to his regular diet and start having bananas, orange juice and vegetables.  I made the pt aware that we will plan to recheck his BMP on 02/09/12. Pt agreed with plan.

## 2012-01-30 NOTE — Telephone Encounter (Signed)
New Problem:    Patient called in returning your call. Please call back. 
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
## 2012-01-30 NOTE — Op Note (Signed)
Operative Note  Gerald Rosario male 66 y.o. 01/30/2012  PREOPERATIVE DX:  Subcutaneous soft tissue mass left upper back  POSTOPERATIVE DX:  Same  PROCEDURE:  Excision of 5 cm subcutaneous soft tissue mass left upper back         Surgeon: Adolph Pollack   Assistants: None  Anesthesia: Local anesthesia Mixture of Xylocaine and Marcaine  Indications:   This is a 66 year old male with an enlarging subcutaneous soft tissue mass of the left upper back that presents for removal.    Procedure Detail:  He was brought to the operating room and placed prone on the operating table. The left upper back was sterilely prepped and draped.  Local anesthetic was infiltrated directly over the mass the dermis and subdermal tissue. A transverse incision was made directly over the mass through the skin and subcutaneous tissue. A thick fibrous capsule was incised. A lipomatous mass was identified and it was quite adherent to the subcutaneous tissue. Using sharp dissection, blunt dissection, and electrocautery I was able to separate this from the subcutaneous tissue and remove it. It measured approximately 5 cm.  I inspected the wound and no other masses were noted. Local anesthetic was infiltrated deep into the wound.  Hemostasis was adequate at this time. The subcutaneous tissue was reapproximated with running 2-0 Vicryl suture. The skin is closed with running 3-0 Monocryl subcuticular stitch. Steri-Strips and a sterile dressing were applied.  He tolerated the procedure well without any apparent complications and was taken to the PACU in satisfactory condition.   Estimated Blood Loss:  Minimal         Drains: none  Blood Given: none          Specimens: Subcutaneous soft tissue mass left upper back        Complications:  * No complications entered in OR log *         Disposition: PACU - hemodynamically stable.         Condition: stable

## 2012-01-31 ENCOUNTER — Encounter (HOSPITAL_BASED_OUTPATIENT_CLINIC_OR_DEPARTMENT_OTHER): Payer: Self-pay | Admitting: General Surgery

## 2012-02-09 ENCOUNTER — Other Ambulatory Visit (INDEPENDENT_AMBULATORY_CARE_PROVIDER_SITE_OTHER): Payer: Medicare Other

## 2012-02-09 DIAGNOSIS — I2510 Atherosclerotic heart disease of native coronary artery without angina pectoris: Secondary | ICD-10-CM

## 2012-02-09 LAB — BASIC METABOLIC PANEL
BUN: 14 mg/dL (ref 6–23)
CO2: 26 mEq/L (ref 19–32)
Calcium: 9.1 mg/dL (ref 8.4–10.5)
Chloride: 109 mEq/L (ref 96–112)
Creatinine, Ser: 1 mg/dL (ref 0.4–1.5)
GFR: 81.97 mL/min (ref >60.00)
Glucose, Bld: 95 mg/dL (ref 70–99)
Potassium: 4.4 mEq/L (ref 3.5–5.1)
Sodium: 141 mEq/L (ref 135–145)

## 2012-02-19 ENCOUNTER — Encounter (INDEPENDENT_AMBULATORY_CARE_PROVIDER_SITE_OTHER): Payer: Self-pay | Admitting: General Surgery

## 2012-02-19 ENCOUNTER — Encounter: Payer: Self-pay | Admitting: Cardiology

## 2012-02-19 ENCOUNTER — Ambulatory Visit (INDEPENDENT_AMBULATORY_CARE_PROVIDER_SITE_OTHER): Payer: Medicare Other | Admitting: General Surgery

## 2012-02-19 VITALS — BP 100/64 | HR 71 | Temp 98.4°F | Ht 67.0 in | Wt 155.6 lb

## 2012-02-19 DIAGNOSIS — Z9889 Other specified postprocedural states: Secondary | ICD-10-CM

## 2012-02-19 NOTE — Progress Notes (Signed)
Procedure:  Removal of lipoma from left upper back  Date:  01/30/1999  Pathology:  Mature lipoma no malignancy  History:  He is here for his first postoperative visit. He states he has not been having any pain just some itching.  Exam: General- Is in NAD.  Back-left upper back incision is clean and intact, Steri-Strips are still present.  Assessment:  Pathology is benign and wound is healing well.  Plan:  Activities as tolerated. Return visit when necessary.

## 2012-02-19 NOTE — Telephone Encounter (Signed)
This encounter was created in error - please disregard.

## 2012-02-19 NOTE — Patient Instructions (Signed)
Activities as tolerated. 

## 2012-02-19 NOTE — Telephone Encounter (Signed)
Pt rtn your call to obtain test results

## 2012-03-04 ENCOUNTER — Other Ambulatory Visit (INDEPENDENT_AMBULATORY_CARE_PROVIDER_SITE_OTHER): Payer: Medicare Other

## 2012-03-04 DIAGNOSIS — Z79899 Other long term (current) drug therapy: Secondary | ICD-10-CM

## 2012-03-04 DIAGNOSIS — E785 Hyperlipidemia, unspecified: Secondary | ICD-10-CM

## 2012-03-04 LAB — HEPATIC FUNCTION PANEL
ALT: 14 U/L (ref 0–53)
AST: 21 U/L (ref 0–37)
Albumin: 3.6 g/dL (ref 3.5–5.2)
Alkaline Phosphatase: 58 U/L (ref 39–117)
Bilirubin, Direct: 0.2 mg/dL (ref 0.0–0.3)
Total Bilirubin: 0.7 mg/dL (ref 0.3–1.2)
Total Protein: 7.1 g/dL (ref 6.0–8.3)

## 2012-03-04 LAB — LIPID PANEL
Cholesterol: 143 mg/dL (ref 0–200)
HDL: 45.6 mg/dL (ref >39.00)
LDL Cholesterol: 80 mg/dL (ref 0–99)
Total CHOL/HDL Ratio: 3
Triglycerides: 86 mg/dL (ref 0.0–149.0)
VLDL: 17.2 mg/dL (ref 0.0–40.0)

## 2012-03-11 ENCOUNTER — Ambulatory Visit (HOSPITAL_COMMUNITY): Payer: Medicare Other | Attending: Cardiology | Admitting: Radiology

## 2012-03-11 DIAGNOSIS — I369 Nonrheumatic tricuspid valve disorder, unspecified: Secondary | ICD-10-CM | POA: Insufficient documentation

## 2012-03-11 DIAGNOSIS — N189 Chronic kidney disease, unspecified: Secondary | ICD-10-CM | POA: Insufficient documentation

## 2012-03-11 DIAGNOSIS — I255 Ischemic cardiomyopathy: Secondary | ICD-10-CM

## 2012-03-11 DIAGNOSIS — I2510 Atherosclerotic heart disease of native coronary artery without angina pectoris: Secondary | ICD-10-CM | POA: Insufficient documentation

## 2012-03-11 DIAGNOSIS — I379 Nonrheumatic pulmonary valve disorder, unspecified: Secondary | ICD-10-CM | POA: Insufficient documentation

## 2012-03-11 DIAGNOSIS — I129 Hypertensive chronic kidney disease with stage 1 through stage 4 chronic kidney disease, or unspecified chronic kidney disease: Secondary | ICD-10-CM | POA: Insufficient documentation

## 2012-03-11 DIAGNOSIS — I5022 Chronic systolic (congestive) heart failure: Secondary | ICD-10-CM | POA: Insufficient documentation

## 2012-03-11 DIAGNOSIS — I2589 Other forms of chronic ischemic heart disease: Secondary | ICD-10-CM | POA: Insufficient documentation

## 2012-03-11 DIAGNOSIS — E119 Type 2 diabetes mellitus without complications: Secondary | ICD-10-CM | POA: Insufficient documentation

## 2012-03-11 DIAGNOSIS — I059 Rheumatic mitral valve disease, unspecified: Secondary | ICD-10-CM | POA: Insufficient documentation

## 2012-03-11 NOTE — Progress Notes (Signed)
Echocardiogram performed.  

## 2012-03-19 ENCOUNTER — Encounter: Payer: Self-pay | Admitting: Cardiology

## 2012-03-19 ENCOUNTER — Ambulatory Visit (INDEPENDENT_AMBULATORY_CARE_PROVIDER_SITE_OTHER): Payer: Medicare Other | Admitting: Cardiology

## 2012-03-19 VITALS — BP 104/66 | HR 76 | Ht 67.0 in | Wt 158.0 lb

## 2012-03-19 DIAGNOSIS — I2510 Atherosclerotic heart disease of native coronary artery without angina pectoris: Secondary | ICD-10-CM

## 2012-03-19 DIAGNOSIS — I10 Essential (primary) hypertension: Secondary | ICD-10-CM

## 2012-03-19 DIAGNOSIS — E7800 Pure hypercholesterolemia, unspecified: Secondary | ICD-10-CM

## 2012-03-19 DIAGNOSIS — I2589 Other forms of chronic ischemic heart disease: Secondary | ICD-10-CM

## 2012-03-19 DIAGNOSIS — I255 Ischemic cardiomyopathy: Secondary | ICD-10-CM

## 2012-03-19 LAB — BASIC METABOLIC PANEL
BUN: 16 mg/dL (ref 6–23)
CO2: 28 mEq/L (ref 19–32)
Calcium: 9.5 mg/dL (ref 8.4–10.5)
Chloride: 100 mEq/L (ref 96–112)
Creatinine, Ser: 1.2 mg/dL (ref 0.4–1.5)
GFR: 66.66 mL/min (ref >60.00)
Glucose, Bld: 140 mg/dL — ABNORMAL HIGH (ref 70–99)
Potassium: 4 mEq/L (ref 3.5–5.1)
Sodium: 137 mEq/L (ref 135–145)

## 2012-03-19 NOTE — Patient Instructions (Addendum)
Your physician recommends that you schedule a follow-up appointment in: February 2014 with Dr Riley Kill  Your physician recommends that you continue on your current medications as directed. Please refer to the Current Medication list given to you today.  Your physician recommends that you have lab work today: BMP

## 2012-03-19 NOTE — Assessment & Plan Note (Signed)
The patient's echo is markedly improved, surprisingly given the MRI findings.  He does have some mild aortic sclerosis as I have reviewed the study in detail.  The LV clearly appears improved.  Based on this, will not favor ICD at this time as there is not currently an indication.  I will see him back in follow up in the near future.  We will recheck his BMET as he remains on diuretics.

## 2012-03-19 NOTE — Assessment & Plan Note (Signed)
controlled 

## 2012-03-19 NOTE — Assessment & Plan Note (Addendum)
Has severe disease.  No current angina.  Continue medical therapy.
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
## 2012-03-19 NOTE — Assessment & Plan Note (Signed)
Not far from target.

## 2012-03-21 ENCOUNTER — Encounter: Payer: Self-pay | Admitting: Cardiology

## 2012-03-21 NOTE — Telephone Encounter (Signed)
This encounter was created in error - please disregard.

## 2012-03-21 NOTE — Telephone Encounter (Signed)
New Problem: ° ° ° °Patient returned your call.  Please call back. °

## 2012-04-04 ENCOUNTER — Ambulatory Visit (INDEPENDENT_AMBULATORY_CARE_PROVIDER_SITE_OTHER): Payer: Medicare Other | Admitting: Ophthalmology

## 2012-04-04 DIAGNOSIS — E1165 Type 2 diabetes mellitus with hyperglycemia: Secondary | ICD-10-CM

## 2012-04-04 DIAGNOSIS — I10 Essential (primary) hypertension: Secondary | ICD-10-CM

## 2012-04-04 DIAGNOSIS — H43819 Vitreous degeneration, unspecified eye: Secondary | ICD-10-CM

## 2012-04-04 DIAGNOSIS — E11319 Type 2 diabetes mellitus with unspecified diabetic retinopathy without macular edema: Secondary | ICD-10-CM

## 2012-04-04 DIAGNOSIS — E1139 Type 2 diabetes mellitus with other diabetic ophthalmic complication: Secondary | ICD-10-CM

## 2012-04-04 DIAGNOSIS — H35039 Hypertensive retinopathy, unspecified eye: Secondary | ICD-10-CM

## 2012-04-04 DIAGNOSIS — H2510 Age-related nuclear cataract, unspecified eye: Secondary | ICD-10-CM

## 2012-04-04 DIAGNOSIS — E11311 Type 2 diabetes mellitus with unspecified diabetic retinopathy with macular edema: Secondary | ICD-10-CM

## 2012-04-10 ENCOUNTER — Other Ambulatory Visit: Payer: Self-pay | Admitting: Internal Medicine

## 2012-04-10 ENCOUNTER — Other Ambulatory Visit: Payer: Self-pay | Admitting: Physician Assistant

## 2012-04-11 ENCOUNTER — Other Ambulatory Visit: Payer: Self-pay | Admitting: *Deleted

## 2012-04-11 MED ORDER — PANTOPRAZOLE SODIUM 40 MG PO TBEC: 40.0000 mg | DELAYED_RELEASE_TABLET | Freq: Every day | ORAL | Status: DC

## 2012-04-11 NOTE — Telephone Encounter (Signed)
Refilled Protonix 

## 2012-05-06 ENCOUNTER — Encounter (INDEPENDENT_AMBULATORY_CARE_PROVIDER_SITE_OTHER): Payer: Medicare Other | Admitting: Ophthalmology

## 2012-05-06 DIAGNOSIS — I10 Essential (primary) hypertension: Secondary | ICD-10-CM

## 2012-05-06 DIAGNOSIS — E1139 Type 2 diabetes mellitus with other diabetic ophthalmic complication: Secondary | ICD-10-CM

## 2012-05-06 DIAGNOSIS — E11319 Type 2 diabetes mellitus with unspecified diabetic retinopathy without macular edema: Secondary | ICD-10-CM

## 2012-05-06 DIAGNOSIS — E11311 Type 2 diabetes mellitus with unspecified diabetic retinopathy with macular edema: Secondary | ICD-10-CM

## 2012-05-06 DIAGNOSIS — H35039 Hypertensive retinopathy, unspecified eye: Secondary | ICD-10-CM

## 2012-05-06 DIAGNOSIS — E1165 Type 2 diabetes mellitus with hyperglycemia: Secondary | ICD-10-CM

## 2012-05-06 DIAGNOSIS — H2510 Age-related nuclear cataract, unspecified eye: Secondary | ICD-10-CM

## 2012-05-06 DIAGNOSIS — H43819 Vitreous degeneration, unspecified eye: Secondary | ICD-10-CM

## 2012-05-21 ENCOUNTER — Ambulatory Visit (INDEPENDENT_AMBULATORY_CARE_PROVIDER_SITE_OTHER): Payer: Medicare Other | Admitting: Cardiology

## 2012-05-21 ENCOUNTER — Encounter: Payer: Self-pay | Admitting: Cardiology

## 2012-05-21 VITALS — BP 140/82 | HR 74 | Ht 67.0 in | Wt 163.0 lb

## 2012-05-21 DIAGNOSIS — I255 Ischemic cardiomyopathy: Secondary | ICD-10-CM

## 2012-05-21 DIAGNOSIS — I10 Essential (primary) hypertension: Secondary | ICD-10-CM

## 2012-05-21 DIAGNOSIS — J45909 Unspecified asthma, uncomplicated: Secondary | ICD-10-CM

## 2012-05-21 DIAGNOSIS — I2510 Atherosclerotic heart disease of native coronary artery without angina pectoris: Secondary | ICD-10-CM

## 2012-05-21 DIAGNOSIS — E7800 Pure hypercholesterolemia, unspecified: Secondary | ICD-10-CM

## 2012-05-21 DIAGNOSIS — I2589 Other forms of chronic ischemic heart disease: Secondary | ICD-10-CM

## 2012-05-21 MED ORDER — METOPROLOL TARTRATE 25 MG PO TABS: 37.5000 mg | ORAL_TABLET | Freq: Two times a day (BID) | ORAL | Status: DC
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
## 2012-05-21 NOTE — Patient Instructions (Addendum)
Your physician has recommended you make the following change in your medication: INCREASE Metoprolol Tartrate to 25mg  take one and one-half tablet by mouth twice a day  Your physician recommends that you schedule a follow-up appointment in: 3 MONTHS with Dr Jens Som (previous pt of Dr Riley Kill)

## 2012-05-21 NOTE — Assessment & Plan Note (Addendum)
Slightly elevated.  Metoprolol increase.  Watch for changes from breathing.  Patient informed.

## 2012-05-21 NOTE — Assessment & Plan Note (Signed)
Extensive CAD.  Continue meds.

## 2012-05-21 NOTE — Assessment & Plan Note (Addendum)
Medically treated CAD with much improved LV function.  Increase metoprolol to 37.5 mg bid.   No angina.  Continue medical therapy.  Follow up in three months with Dr. Jens Som.  Not a carvedilol candidate due to asthma.  Will need to watch for breathing changes.

## 2012-05-21 NOTE — Assessment & Plan Note (Signed)
Tolerates pravachol.

## 2012-05-23 ENCOUNTER — Other Ambulatory Visit: Payer: Self-pay

## 2012-05-23 MED ORDER — PRAVASTATIN SODIUM 80 MG PO TABS: 80.0000 mg | ORAL_TABLET | Freq: Every morning | ORAL | Status: DC

## 2012-05-24 ENCOUNTER — Other Ambulatory Visit: Payer: Self-pay | Admitting: *Deleted

## 2012-05-31 NOTE — Assessment & Plan Note (Signed)
Managed.  Not a good candidate for carvedilol.

## 2012-06-17 ENCOUNTER — Other Ambulatory Visit: Payer: Self-pay | Admitting: *Deleted

## 2012-06-17 MED ORDER — HYDRALAZINE HCL 10 MG PO TABS: 10.0000 mg | ORAL_TABLET | Freq: Three times a day (TID) | ORAL | Status: DC

## 2012-06-18 ENCOUNTER — Encounter (INDEPENDENT_AMBULATORY_CARE_PROVIDER_SITE_OTHER): Payer: Medicare Other | Admitting: Ophthalmology

## 2012-06-18 ENCOUNTER — Other Ambulatory Visit: Payer: Self-pay | Admitting: *Deleted

## 2012-06-18 DIAGNOSIS — I10 Essential (primary) hypertension: Secondary | ICD-10-CM

## 2012-06-18 DIAGNOSIS — H35039 Hypertensive retinopathy, unspecified eye: Secondary | ICD-10-CM

## 2012-06-18 DIAGNOSIS — E11311 Type 2 diabetes mellitus with unspecified diabetic retinopathy with macular edema: Secondary | ICD-10-CM

## 2012-06-18 DIAGNOSIS — E11319 Type 2 diabetes mellitus with unspecified diabetic retinopathy without macular edema: Secondary | ICD-10-CM

## 2012-06-18 DIAGNOSIS — E1139 Type 2 diabetes mellitus with other diabetic ophthalmic complication: Secondary | ICD-10-CM

## 2012-06-18 MED ORDER — HYDRALAZINE HCL 10 MG PO TABS: 10.0000 mg | ORAL_TABLET | Freq: Three times a day (TID) | ORAL | Status: DC

## 2012-07-30 ENCOUNTER — Encounter (INDEPENDENT_AMBULATORY_CARE_PROVIDER_SITE_OTHER): Payer: Medicare Other | Admitting: Ophthalmology

## 2012-07-30 DIAGNOSIS — E1139 Type 2 diabetes mellitus with other diabetic ophthalmic complication: Secondary | ICD-10-CM

## 2012-07-30 DIAGNOSIS — H35039 Hypertensive retinopathy, unspecified eye: Secondary | ICD-10-CM

## 2012-07-30 DIAGNOSIS — E11319 Type 2 diabetes mellitus with unspecified diabetic retinopathy without macular edema: Secondary | ICD-10-CM

## 2012-07-30 DIAGNOSIS — E11311 Type 2 diabetes mellitus with unspecified diabetic retinopathy with macular edema: Secondary | ICD-10-CM

## 2012-07-30 DIAGNOSIS — H43819 Vitreous degeneration, unspecified eye: Secondary | ICD-10-CM

## 2012-07-30 DIAGNOSIS — I10 Essential (primary) hypertension: Secondary | ICD-10-CM

## 2012-08-20 ENCOUNTER — Ambulatory Visit (INDEPENDENT_AMBULATORY_CARE_PROVIDER_SITE_OTHER): Payer: Medicare Other | Admitting: Cardiology

## 2012-08-20 ENCOUNTER — Encounter: Payer: Self-pay | Admitting: Cardiology

## 2012-08-20 VITALS — BP 128/70 | HR 72 | Ht 67.0 in | Wt 158.0 lb

## 2012-08-20 DIAGNOSIS — I2510 Atherosclerotic heart disease of native coronary artery without angina pectoris: Secondary | ICD-10-CM

## 2012-08-20 DIAGNOSIS — I10 Essential (primary) hypertension: Secondary | ICD-10-CM

## 2012-08-20 DIAGNOSIS — I2589 Other forms of chronic ischemic heart disease: Secondary | ICD-10-CM

## 2012-08-20 DIAGNOSIS — I255 Ischemic cardiomyopathy: Secondary | ICD-10-CM

## 2012-08-20 LAB — BASIC METABOLIC PANEL
BUN: 17 mg/dL (ref 6–23)
CO2: 28 mEq/L (ref 19–32)
Calcium: 9.4 mg/dL (ref 8.4–10.5)
Chloride: 102 mEq/L (ref 96–112)
Creatinine, Ser: 1.2 mg/dL (ref 0.4–1.5)
GFR: 62.81 mL/min (ref >60.00)
Glucose, Bld: 110 mg/dL — ABNORMAL HIGH (ref 70–99)
Potassium: 4.2 mEq/L (ref 3.5–5.1)
Sodium: 140 mEq/L (ref 135–145)

## 2012-08-20 NOTE — Assessment & Plan Note (Signed)
Continue present dose of Lasix. Check potassium and renal function. 

## 2012-08-20 NOTE — Assessment & Plan Note (Signed)
Continue statin. 

## 2012-08-20 NOTE — Assessment & Plan Note (Signed)
Patient's LV function was severely reduced previously. Most recent echocardiogram suggests improvement. I will schedule a cardiac MRI to better assess. If less than 35% he would need consideration of ICD. If his LV function is reduced I will change Lopressor to Toprol. Continue hydralazine/nitrate. Per notes ACE inhibitor causes hyperkalemia previously.
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
## 2012-08-20 NOTE — Patient Instructions (Addendum)
Your physician wants you to follow-up in: 6 MONTHS WITH DR Jens Som  You will receive a reminder letter in the mail two months in advance. If you don't receive a letter, please call our office to schedule the follow-up appointment.   Your physician recommends that you HAVE LAB WORK TODAY  CARDIAC MRI WITHOUT CONTRAST TO ASSESS LV FUNCTION

## 2012-08-20 NOTE — Assessment & Plan Note (Signed)
Blood pressure controlled. Continue present medications. 

## 2012-08-20 NOTE — Assessment & Plan Note (Signed)
Continue aspirin and statin. 

## 2012-08-28 ENCOUNTER — Ambulatory Visit (HOSPITAL_COMMUNITY)
Admission: RE | Admit: 2012-08-28 | Discharge: 2012-08-28 | Disposition: A | Discharge status: Home / Self Care | Payer: Medicare Other | Source: Ambulatory Visit | Attending: Cardiology | Admitting: Cardiology

## 2012-08-28 DIAGNOSIS — I2589 Other forms of chronic ischemic heart disease: Secondary | ICD-10-CM

## 2012-08-28 DIAGNOSIS — I2510 Atherosclerotic heart disease of native coronary artery without angina pectoris: Secondary | ICD-10-CM

## 2012-08-28 DIAGNOSIS — I259 Chronic ischemic heart disease, unspecified: Secondary | ICD-10-CM | POA: Insufficient documentation

## 2012-08-28 DIAGNOSIS — I517 Cardiomegaly: Secondary | ICD-10-CM | POA: Insufficient documentation

## 2012-08-28 DIAGNOSIS — I255 Ischemic cardiomyopathy: Secondary | ICD-10-CM

## 2012-08-28 DIAGNOSIS — I10 Essential (primary) hypertension: Secondary | ICD-10-CM

## 2012-08-29 ENCOUNTER — Telehealth: Payer: Self-pay | Admitting: Cardiology

## 2012-08-29 NOTE — Telephone Encounter (Signed)
Spoke with pt, aware of MRA results

## 2012-08-29 NOTE — Telephone Encounter (Signed)
Follow UP   Pt is returning your call regarding some test results .

## 2012-09-10 ENCOUNTER — Encounter (INDEPENDENT_AMBULATORY_CARE_PROVIDER_SITE_OTHER): Payer: Medicare Other | Admitting: Ophthalmology

## 2012-09-10 DIAGNOSIS — I10 Essential (primary) hypertension: Secondary | ICD-10-CM

## 2012-09-10 DIAGNOSIS — H43819 Vitreous degeneration, unspecified eye: Secondary | ICD-10-CM

## 2012-09-10 DIAGNOSIS — H35039 Hypertensive retinopathy, unspecified eye: Secondary | ICD-10-CM

## 2012-09-10 DIAGNOSIS — E1139 Type 2 diabetes mellitus with other diabetic ophthalmic complication: Secondary | ICD-10-CM

## 2012-09-10 DIAGNOSIS — E11311 Type 2 diabetes mellitus with unspecified diabetic retinopathy with macular edema: Secondary | ICD-10-CM

## 2012-09-10 DIAGNOSIS — E11319 Type 2 diabetes mellitus with unspecified diabetic retinopathy without macular edema: Secondary | ICD-10-CM

## 2012-09-10 DIAGNOSIS — E1165 Type 2 diabetes mellitus with hyperglycemia: Secondary | ICD-10-CM

## 2012-09-16 ENCOUNTER — Other Ambulatory Visit: Payer: Self-pay | Admitting: *Deleted

## 2012-09-16 MED ORDER — HYDRALAZINE HCL 10 MG PO TABS: 10.0000 mg | ORAL_TABLET | Freq: Three times a day (TID) | ORAL | Status: DC

## 2012-10-02 ENCOUNTER — Other Ambulatory Visit: Payer: Self-pay | Admitting: Internal Medicine

## 2012-10-06 ENCOUNTER — Emergency Department (HOSPITAL_COMMUNITY)
Admission: EM | Admit: 2012-10-06 | Discharge: 2012-10-06 | Disposition: A | Discharge status: Home / Self Care | Payer: Medicare Other | Attending: Emergency Medicine | Admitting: Emergency Medicine

## 2012-10-06 ENCOUNTER — Encounter (HOSPITAL_COMMUNITY): Payer: Self-pay | Admitting: Emergency Medicine

## 2012-10-06 DIAGNOSIS — Z79899 Other long term (current) drug therapy: Secondary | ICD-10-CM | POA: Insufficient documentation

## 2012-10-06 DIAGNOSIS — I252 Old myocardial infarction: Secondary | ICD-10-CM | POA: Insufficient documentation

## 2012-10-06 DIAGNOSIS — Z8679 Personal history of other diseases of the circulatory system: Secondary | ICD-10-CM | POA: Insufficient documentation

## 2012-10-06 DIAGNOSIS — I2510 Atherosclerotic heart disease of native coronary artery without angina pectoris: Secondary | ICD-10-CM | POA: Insufficient documentation

## 2012-10-06 DIAGNOSIS — Z8669 Personal history of other diseases of the nervous system and sense organs: Secondary | ICD-10-CM | POA: Insufficient documentation

## 2012-10-06 DIAGNOSIS — Z794 Long term (current) use of insulin: Secondary | ICD-10-CM | POA: Insufficient documentation

## 2012-10-06 DIAGNOSIS — Z9861 Coronary angioplasty status: Secondary | ICD-10-CM | POA: Insufficient documentation

## 2012-10-06 DIAGNOSIS — Z7982 Long term (current) use of aspirin: Secondary | ICD-10-CM | POA: Insufficient documentation

## 2012-10-06 DIAGNOSIS — Z8639 Personal history of other endocrine, nutritional and metabolic disease: Secondary | ICD-10-CM | POA: Insufficient documentation

## 2012-10-06 DIAGNOSIS — Z862 Personal history of diseases of the blood and blood-forming organs and certain disorders involving the immune mechanism: Secondary | ICD-10-CM | POA: Insufficient documentation

## 2012-10-06 DIAGNOSIS — I509 Heart failure, unspecified: Secondary | ICD-10-CM | POA: Insufficient documentation

## 2012-10-06 DIAGNOSIS — Z8709 Personal history of other diseases of the respiratory system: Secondary | ICD-10-CM | POA: Insufficient documentation

## 2012-10-06 DIAGNOSIS — J45901 Unspecified asthma with (acute) exacerbation: Secondary | ICD-10-CM | POA: Insufficient documentation

## 2012-10-06 DIAGNOSIS — Z87891 Personal history of nicotine dependence: Secondary | ICD-10-CM | POA: Insufficient documentation

## 2012-10-06 DIAGNOSIS — I129 Hypertensive chronic kidney disease with stage 1 through stage 4 chronic kidney disease, or unspecified chronic kidney disease: Secondary | ICD-10-CM | POA: Insufficient documentation

## 2012-10-06 DIAGNOSIS — E119 Type 2 diabetes mellitus without complications: Secondary | ICD-10-CM | POA: Insufficient documentation

## 2012-10-06 DIAGNOSIS — L259 Unspecified contact dermatitis, unspecified cause: Secondary | ICD-10-CM | POA: Insufficient documentation

## 2012-10-06 DIAGNOSIS — Z87448 Personal history of other diseases of urinary system: Secondary | ICD-10-CM | POA: Insufficient documentation

## 2012-10-06 DIAGNOSIS — N189 Chronic kidney disease, unspecified: Secondary | ICD-10-CM | POA: Insufficient documentation

## 2012-10-06 MED ORDER — PREDNISONE 20 MG PO TABS
60.0000 mg | ORAL_TABLET | Freq: Once | ORAL | Status: AC
  Administered 2012-10-06: 60 mg via ORAL
  Filled 2012-10-06: qty 3

## 2012-10-06 MED ORDER — PREDNISONE (PAK) 10 MG PO TABS: 10.0000 mg | ORAL_TABLET | Freq: Every day | ORAL | Status: DC

## 2012-10-06 MED ORDER — DIPHENHYDRAMINE HCL 25 MG PO TABS: 25.0000 mg | ORAL_TABLET | Freq: Four times a day (QID) | ORAL | Status: DC

## 2012-10-06 NOTE — ED Notes (Signed)
Onset unilateral rash right side from flank up to armpit and down right upper arm 4 or 5 days ago. Rash is itchy, red and swollen. Pt states he has tried every sort of cream he can find and nothing helps.

## 2012-10-06 NOTE — ED Provider Notes (Signed)
Medical screening examination/treatment/procedure(s) were performed by non-physician practitioner and as supervising physician I was immediately available for consultation/collaboration.    Christopher J. Pollina, MD 10/06/12 2302 
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
## 2012-10-22 ENCOUNTER — Encounter (INDEPENDENT_AMBULATORY_CARE_PROVIDER_SITE_OTHER): Payer: Medicare Other | Admitting: Ophthalmology

## 2012-10-22 DIAGNOSIS — H3581 Retinal edema: Secondary | ICD-10-CM

## 2012-10-22 DIAGNOSIS — H43819 Vitreous degeneration, unspecified eye: Secondary | ICD-10-CM

## 2012-10-22 DIAGNOSIS — E11319 Type 2 diabetes mellitus with unspecified diabetic retinopathy without macular edema: Secondary | ICD-10-CM

## 2012-10-22 DIAGNOSIS — E1139 Type 2 diabetes mellitus with other diabetic ophthalmic complication: Secondary | ICD-10-CM

## 2012-10-22 DIAGNOSIS — E1165 Type 2 diabetes mellitus with hyperglycemia: Secondary | ICD-10-CM

## 2012-10-27 ENCOUNTER — Other Ambulatory Visit: Payer: Self-pay | Admitting: Cardiology

## 2012-12-24 ENCOUNTER — Other Ambulatory Visit: Payer: Self-pay | Admitting: Urology

## 2012-12-25 ENCOUNTER — Encounter (HOSPITAL_COMMUNITY): Payer: Self-pay | Admitting: Pharmacy Technician

## 2012-12-26 ENCOUNTER — Encounter (HOSPITAL_COMMUNITY): Payer: Self-pay

## 2012-12-26 ENCOUNTER — Ambulatory Visit (HOSPITAL_COMMUNITY)
Admission: RE | Admit: 2012-12-26 | Discharge: 2012-12-26 | Disposition: A | Discharge status: Home / Self Care | Payer: Medicare Other | Source: Ambulatory Visit | Attending: Urology | Admitting: Urology

## 2012-12-26 ENCOUNTER — Encounter (HOSPITAL_COMMUNITY)
Admission: RE | Admit: 2012-12-26 | Discharge: 2012-12-26 | Disposition: A | Discharge status: Home / Self Care | Payer: Medicare Other | Source: Ambulatory Visit | Attending: Urology | Admitting: Urology

## 2012-12-26 DIAGNOSIS — Z01818 Encounter for other preprocedural examination: Secondary | ICD-10-CM | POA: Insufficient documentation

## 2012-12-26 DIAGNOSIS — I700 Atherosclerosis of aorta: Secondary | ICD-10-CM | POA: Insufficient documentation

## 2012-12-26 DIAGNOSIS — J45909 Unspecified asthma, uncomplicated: Secondary | ICD-10-CM | POA: Insufficient documentation

## 2012-12-26 DIAGNOSIS — Z01812 Encounter for preprocedural laboratory examination: Secondary | ICD-10-CM | POA: Insufficient documentation

## 2012-12-26 HISTORY — PX: CATARACT EXTRACTION, BILATERAL: SHX1313

## 2012-12-26 LAB — BASIC METABOLIC PANEL
BUN: 14 mg/dL (ref 6–23)
CO2: 29 mEq/L (ref 19–32)
Calcium: 9.2 mg/dL (ref 8.4–10.5)
Chloride: 104 mEq/L (ref 96–112)
Creatinine, Ser: 1.11 mg/dL (ref 0.50–1.35)
GFR calc Af Amer: 77 mL/min — ABNORMAL LOW (ref >90)
GFR calc non Af Amer: 66 mL/min — ABNORMAL LOW (ref >90)
Glucose, Bld: 111 mg/dL — ABNORMAL HIGH (ref 70–99)
Potassium: 3.8 mEq/L (ref 3.5–5.1)
Sodium: 142 mEq/L (ref 135–145)

## 2012-12-26 LAB — CBC
HCT: 35.9 % — ABNORMAL LOW (ref 39.0–52.0)
Hemoglobin: 11.6 g/dL — ABNORMAL LOW (ref 13.0–17.0)
MCH: 29 pg (ref 26.0–34.0)
MCHC: 32.3 g/dL (ref 30.0–36.0)
MCV: 89.8 fL (ref 78.0–100.0)
Platelets: 296 10*3/uL (ref 150–400)
RBC: 4 MIL/uL — ABNORMAL LOW (ref 4.22–5.81)
RDW: 13.9 % (ref 11.5–15.5)
WBC: 6.8 10*3/uL (ref 4.0–10.5)

## 2012-12-26 NOTE — Patient Instructions (Addendum)
20 JAHNI NAZAR  12/26/2012   Your procedure is scheduled on: 10-7  -2014  Report to [facility] at     0530   AM.  Call this number if you have problems the morning of surgery: [phone]  Or Presurgical Testing [phone](Merlina Marchena)   Remember: Follow any bowel prep instructions per MD office.    Do not eat food:After Midnight.    Take these medicines the morning of surgery with A SIP OF WATER: Isosorbide. Metoprolol. Pantoprazole. Pravastatin. Tamsulosin. Tramadol(if needed).  Use Advair/bring. Do not take any Diabetic meds AM of.   Do not wear jewelry, make-up or nail polish.  Do not wear lotions, powders, or perfumes. You may wear deodorant.  Do not shave 12 hours prior to first CHG shower(legs and under arms).(face and neck okay.)  Do not bring valuables to the hospital.  Contacts, dentures or bridgework,body piercing,  may not be worn into surgery.  Leave suitcase in the car. After surgery it may be brought to your room.  For patients admitted to the hospital, checkout time is 11:00 AM the day of discharge.   Patients discharged the day of surgery will not be allowed to drive home. Must have responsible person with you x 24 hours once discharged.  Name and phone number of your driver: Simona Huh- # will provide  Special Instructions: CHG(Chlorhedine 4%-"Hibiclens","Betasept","Aplicare") Shower Use Special Wash: see special instructions.(avoid face and genitals)    Failure to follow these instructions may result in Cancellation of your surgery.   Patient signature_______________________________________________________

## 2013-01-06 ENCOUNTER — Encounter (HOSPITAL_COMMUNITY): Payer: Self-pay | Admitting: Anesthesiology

## 2013-01-06 NOTE — H&P (Signed)
History of Present Illness     F/u BPH, urinary retention. Referred by Dr. Brunilda Payor   -Mar 2013 BPH, urinary retention. CT scan on Feb 2013 for evaluation of venous obstruction showed markedly distended bladder, normal kidneys. Pt was having leakage at night. Bladder distention was also noted on a CT scan in July 2012.  PVR was greater than 1300 ml. He was started on Rapafo. Renal ultrasound revealed no hydronephrosis.  He was catheterized for 2300 ml and now has an indwelling Foley catheter for chronic urinary retention. -May 2013 Prostate u/s 47 ml. -Apr 2013 Cystoscopy showed prostatic hypertrophy (trilobar with a median lobe).  -May 2013 UDS - large capacity hyposensitive bladder with weak, unsustained contraction for voiding.  -July 2013 PSA 2.9 -Sept 2013 Greenlight PVP - postop course was complicated by an indwelling Foley, hypotonic bladder, void fail. Learned CIC, began voiding on his own.  -Feb 2013 PVR 97 ml -August 2014 PSA 1.57; PVR 341, pt tried to cath and could not;  Cr increased to 1.4, baseline 1.0 - 1.3.   -Sept 2014 Renal U/S - no hydronephrosis, post void 209 cc  He sees Dr. Riley Kill for his heart and will not need a pacer or defibrillator. His electrical studies were normal. He needed no heart stents.   He returns today for renal/bladder US and cystoscopy. He has had no dysuria or gross hematuria. He continues to void with a weak stream and doesnt feel empty. These symptoms are what prompted him to attempt CIC a few weeks ago. He noted a good flow initially after GL PVP then it slowed over the months.   Renal ultrasound today - I reviewed all the images, see technician she for details, findings: The kidneys are normal in appearance without mass, hydronephrosis or echogenic focus. The bladder area appeared normal with no ureters noted. The postvoid is 209 mL.     Past Medical History Problems  1. History of  Acute Myocardial Infarction V12.59 2. History of  Asthma  493.90 3. History of  Atrial Fibrillation 427.31 4. History of  Diabetes Mellitus 250.00 5. History of  Heart Disease 429.9 6. History of  Heartburn 787.1 7. History of  Hypercholesterolemia 272.0 8. History of  Hypertension 401.9  Surgical History Problems  1. History of  Arm Incision Left 2. History of  Hand Surgery Left 3. History of  Hemorrhoidectomy 4. History of  Laser Vaporization With Transurethral Resection Of Prostate  Current Meds 1. Advair HFA 115-21 MCG/ACT Inhalation Aerosol; Therapy: 03Feb2013 to 2. Fish Oil CAPS; Therapy: (Recorded:22Mar2013) to 3. GlipiZIDE 10 MG Oral Tablet; Therapy: 09Sep2014 to 4. Isosorbide Mononitrate ER 30 MG Oral Tablet Extended Release 24 Hour; Therapy: 07Jan2013 to 5. Lasix 40 MG Oral Tablet; Therapy: (Recorded:22Mar2013) to 6. Multi-Vitamin TABS; Therapy: (Recorded:22Mar2013) to 7. Pravastatin Sodium 80 MG Oral Tablet; Therapy: 22Oct2013 to 8. Protonix 40 MG Oral Tablet Delayed Release; Therapy: (Recorded:22Mar2013) to 9. Tamsulosin HCl 0.4 MG Oral Capsule; 1QD - TAKE ONE CAPSULE BY MOUTH EVERY DAY;  Therapy: 11Feb2014 to (Evaluate:06Feb2015)  Requested for: 17Feb2014; Last Rx:11Feb2014 10. Tamsulosin HCl 0.4 MG Oral Capsule; TAKE 1 CAPSULE Daily; Therapy: 03Dec2013 to   (Evaluate:02Apr2014)  Requested for: 18Dec2013; Last Rx:03Dec2013; Status: ACTIVE -   Renewal Denied 11. Ultram 50 MG Oral Tablet; Therapy: (Recorded:22Mar2013) to  Allergies Medication  1. Sulfa Drugs  Family History Problems  1. Maternal history of  Acute Myocardial Infarction V17.3 2. Family history of  Family Health Status Number Of Children 1 daughter 3.  Family history of  Father Deceased At Age ____ heart attack 4. Paternal history of  Malignant Clear Cell Type Neoplasm Of The Rectum V16.0 5. Family history of  Mother Deceased At Age 76 rectal ca 6. Family history of  Renal Failure  Social History Problems  1. Alcohol Use 5-6 a month 2. Caffeine  Use 4 3. Marital History - Divorced V61.03 4. Tobacco Use 305.1 2 pke for 18 yrs / quit 1982  Review of Systems Constitutional, cardiovascular, pulmonary, gastrointestinal and neurological system(s) were reviewed and pertinent findings if present are noted.    Vitals Vital Signs [Data Includes: Last 1 Day]  22Sep2014 02:08PM  Blood Pressure: 99 / 58 Temperature: 97.1 F Heart Rate: 56  Physical Exam Constitutional: Well nourished and well developed . No acute distress.  Pulmonary: No respiratory distress and normal respiratory rhythm and effort.  Cardiovascular: Heart rate and rhythm are normal . No peripheral edema.  Neuro/Psych:. Mood and affect are appropriate.    Procedure  Procedure: Cystoscopy   Informed Consent: Risks, benefits, and potential adverse events were discussed and informed consent was obtained from the patient.  Prep: The patient was prepped with betadine.  Procedure Note:  Urethral meatus:. No abnormalities.  Anterior urethra: No abnormalities.  Prostatic urethra: No abnormalities . There was visual obstruction of the prostatic urethra.  regrowth of left lateral lobe causing 100% VO and there is a bridge of tissue down from prostate lobe into mid-prostatic urethra  Bladder: Visulization was clear. The ureteral orifices were in the normal anatomic position bilaterally and had clear efflux of urine. A systematic survey of the bladder demonstrated no bladder tumors or stones. The mucosa was smooth without abnormalities. The patient tolerated the procedure well.  Complications: None.    Assessment Assessed  1. Benign Prostatic Hypertrophy 600.00 2. Incomplete Emptying Of Bladder 788.21  Plan Health Maintenance (V70.0)  1. UA With REFLEX  Done: 22Sep2014 01:14PM Incomplete Emptying Of Bladder (788.21)  2. Follow-up Schedule Surgery Office  Follow-up  Done: 22Sep2014  Discussion/Summary  Discussed BPH regrowth andnature, R.B of doing nothing, repeat GL or  proceed with limited TURP. Will proceed with limited TURP - discussed risks of bleeding, stricture and incontinence among others. He elects to proceed.       Signatures Electronically signed by : Jerilee Field, M.D.; Dec 24 2012  2:20PM

## 2013-01-06 NOTE — Anesthesia Preprocedure Evaluation (Addendum)
Anesthesia Evaluation  Patient identified by MRN, date of birth, ID band Patient awake    Reviewed: Allergy & Precautions, H&P , NPO status , Patient's Chart, lab work & pertinent test results  Airway Mallampati: II TM Distance: >3 FB Neck ROM: Full    Dental no notable dental hx.    Pulmonary shortness of breath, asthma ,  CXR reviewed.  breath sounds clear to auscultation  Pulmonary exam normal       Cardiovascular hypertension, Pt. on medications and Pt. on home beta blockers + CAD, + Past MI and +CHF Rhythm:Regular Rate:Normal  Last cardiology office visit of 08-20-12 reviewed.  ECG 05-21-12 reviewed. NS stwa  ECHO EF 55-60% Cardiac MRI: 08-28-12: EF 52%. LVE   Neuro/Psych negative neurological ROS  negative psych ROS   GI/Hepatic negative GI ROS, Neg liver ROS,   Endo/Other  diabetes, Type 2, Oral Hypoglycemic Agents  Renal/GU Renal disease  negative genitourinary   Musculoskeletal negative musculoskeletal ROS (+)   Abdominal   Peds negative pediatric ROS (+)  Hematology negative hematology ROS (+)   Anesthesia Other Findings   Reproductive/Obstetrics negative OB ROS                         Anesthesia Physical Anesthesia Plan  ASA: III  Anesthesia Plan: General   Post-op Pain Management:    Induction: Intravenous  Airway Management Planned: LMA  Additional Equipment:   Intra-op Plan:   Post-operative Plan: Extubation in OR  Informed Consent: I have reviewed the patients History and Physical, chart, labs and discussed the procedure including the risks, benefits and alternatives for the proposed anesthesia with the patient or authorized representative who has indicated his/her understanding and acceptance.   Dental advisory given  Plan Discussed with: CRNA  Anesthesia Plan Comments: (Mild chronic hoarseness which has been evaluated in the past.)       Anesthesia  Quick Evaluation

## 2013-01-07 ENCOUNTER — Encounter (HOSPITAL_COMMUNITY): Payer: Self-pay | Admitting: Anesthesiology

## 2013-01-07 ENCOUNTER — Ambulatory Visit (HOSPITAL_COMMUNITY)
Admission: RE | Admit: 2013-01-07 | Discharge: 2013-01-07 | Disposition: A | Discharge status: Home / Self Care | Payer: Medicare Other | Source: Ambulatory Visit | Attending: Urology | Admitting: Urology

## 2013-01-07 ENCOUNTER — Encounter (HOSPITAL_COMMUNITY): Payer: Self-pay

## 2013-01-07 ENCOUNTER — Ambulatory Visit (HOSPITAL_COMMUNITY): Payer: Medicare Other | Admitting: Anesthesiology

## 2013-01-07 ENCOUNTER — Encounter (HOSPITAL_COMMUNITY)
Admission: RE | Disposition: A | Discharge status: Home / Self Care | Payer: Self-pay | Source: Ambulatory Visit | Attending: Urology

## 2013-01-07 DIAGNOSIS — R339 Retention of urine, unspecified: Secondary | ICD-10-CM | POA: Insufficient documentation

## 2013-01-07 DIAGNOSIS — F17200 Nicotine dependence, unspecified, uncomplicated: Secondary | ICD-10-CM | POA: Insufficient documentation

## 2013-01-07 DIAGNOSIS — N138 Other obstructive and reflux uropathy: Secondary | ICD-10-CM | POA: Insufficient documentation

## 2013-01-07 DIAGNOSIS — N4289 Other specified disorders of prostate: Secondary | ICD-10-CM | POA: Insufficient documentation

## 2013-01-07 DIAGNOSIS — E119 Type 2 diabetes mellitus without complications: Secondary | ICD-10-CM | POA: Insufficient documentation

## 2013-01-07 DIAGNOSIS — N312 Flaccid neuropathic bladder, not elsewhere classified: Secondary | ICD-10-CM | POA: Insufficient documentation

## 2013-01-07 DIAGNOSIS — I10 Essential (primary) hypertension: Secondary | ICD-10-CM | POA: Insufficient documentation

## 2013-01-07 DIAGNOSIS — Z79899 Other long term (current) drug therapy: Secondary | ICD-10-CM | POA: Insufficient documentation

## 2013-01-07 DIAGNOSIS — E7800 Pure hypercholesterolemia, unspecified: Secondary | ICD-10-CM | POA: Insufficient documentation

## 2013-01-07 DIAGNOSIS — N401 Enlarged prostate with lower urinary tract symptoms: Secondary | ICD-10-CM | POA: Insufficient documentation

## 2013-01-07 DIAGNOSIS — N320 Bladder-neck obstruction: Secondary | ICD-10-CM | POA: Insufficient documentation

## 2013-01-07 DIAGNOSIS — J45909 Unspecified asthma, uncomplicated: Secondary | ICD-10-CM | POA: Insufficient documentation

## 2013-01-07 DIAGNOSIS — I252 Old myocardial infarction: Secondary | ICD-10-CM | POA: Insufficient documentation

## 2013-01-07 DIAGNOSIS — I4891 Unspecified atrial fibrillation: Secondary | ICD-10-CM | POA: Insufficient documentation

## 2013-01-07 HISTORY — PX: TRANSURETHRAL RESECTION OF PROSTATE: SHX73

## 2013-01-07 LAB — GLUCOSE, CAPILLARY
Glucose-Capillary: 109 mg/dL — ABNORMAL HIGH (ref 70–99)
Glucose-Capillary: 106 mg/dL — ABNORMAL HIGH (ref 70–99)

## 2013-01-07 SURGERY — TRANSURETHRAL RESECTION OF THE PROSTATE WITH GYRUS INSTRUMENTS
Anesthesia: General | Wound class: Clean Contaminated

## 2013-01-07 MED ORDER — HYDROMORPHONE HCL PF 1 MG/ML IJ SOLN: 0.2500 mg | INTRAMUSCULAR | Status: DC | PRN

## 2013-01-07 MED ORDER — PROMETHAZINE HCL 25 MG/ML IJ SOLN: 6.2500 mg | INTRAMUSCULAR | Status: DC | PRN

## 2013-01-07 MED ORDER — CEPHALEXIN 500 MG PO CAPS: 500.0000 mg | ORAL_CAPSULE | Freq: Every day | ORAL | Status: DC

## 2013-01-07 MED ORDER — MIDAZOLAM HCL 5 MG/5ML IJ SOLN
INTRAMUSCULAR | Status: DC | PRN
  Administered 2013-01-07 (×2): 1 mg via INTRAVENOUS

## 2013-01-07 MED ORDER — FENTANYL CITRATE 0.05 MG/ML IJ SOLN
INTRAMUSCULAR | Status: AC
  Filled 2013-01-07: qty 2

## 2013-01-07 MED ORDER — FENTANYL CITRATE 0.05 MG/ML IJ SOLN
INTRAMUSCULAR | Status: DC | PRN
  Administered 2013-01-07: 50 ug via INTRAVENOUS

## 2013-01-07 MED ORDER — LACTATED RINGERS IV SOLN
INTRAVENOUS | Status: DC | PRN
  Administered 2013-01-07: 07:00:00 via INTRAVENOUS

## 2013-01-07 MED ORDER — CEFAZOLIN SODIUM-DEXTROSE 2-3 GM-% IV SOLR
2.0000 g | INTRAVENOUS | Status: AC
  Administered 2013-01-07: 2 g via INTRAVENOUS

## 2013-01-07 MED ORDER — ASPIRIN 81 MG PO TABS: 81.0000 mg | ORAL_TABLET | Freq: Every morning | ORAL | Status: DC

## 2013-01-07 MED ORDER — LIDOCAINE HCL (CARDIAC) 20 MG/ML IV SOLN
INTRAVENOUS | Status: DC | PRN
  Administered 2013-01-07: 60 mg via INTRAVENOUS

## 2013-01-07 MED ORDER — PROPOFOL 10 MG/ML IV BOLUS
INTRAVENOUS | Status: DC | PRN
  Administered 2013-01-07: 200 mg via INTRAVENOUS

## 2013-01-07 MED ORDER — FENTANYL CITRATE 0.05 MG/ML IJ SOLN
25.0000 ug | INTRAMUSCULAR | Status: DC | PRN
  Administered 2013-01-07: 25 ug via INTRAVENOUS

## 2013-01-07 MED ORDER — SODIUM CHLORIDE 0.9 % IR SOLN
Status: DC | PRN
  Administered 2013-01-07: 18000 mL via INTRAVESICAL

## 2013-01-07 MED ORDER — CEFAZOLIN SODIUM-DEXTROSE 2-3 GM-% IV SOLR
INTRAVENOUS | Status: AC
  Filled 2013-01-07: qty 50

## 2013-01-07 MED ORDER — ONDANSETRON HCL 4 MG/2ML IJ SOLN
INTRAMUSCULAR | Status: DC | PRN
  Administered 2013-01-07: 4 mg via INTRAMUSCULAR

## 2013-01-07 SURGICAL SUPPLY — 20 items
BAG URINE DRAINAGE (UROLOGICAL SUPPLIES) ×2 IMPLANT
BAG URO CATCHER STRL LF (DRAPE) ×2 IMPLANT
CATH FOLEY 3WAY 30CC 22FR (CATHETERS) ×2 IMPLANT
CLOTH BEACON ORANGE TIMEOUT ST (SAFETY) ×2 IMPLANT
DRAPE CAMERA CLOSED 9X96 (DRAPES) ×2 IMPLANT
ELECT BUTTON HF 24-28F 2 30DE (ELECTRODE) ×1 IMPLANT
ELECT LOOP MED HF 24F 12D (CUTTING LOOP) IMPLANT
ELECT LOOP MED HF 24F 12D CBL (CLIP) ×2 IMPLANT
ELECT RESECT VAPORIZE 12D CBL (ELECTRODE) IMPLANT
EVACUATOR MICROVAS BLADDER (UROLOGICAL SUPPLIES) ×2 IMPLANT
GLOVE BIOGEL M STRL SZ7.5 (GLOVE) ×2 IMPLANT
GOWN STRL REIN XL XLG (GOWN DISPOSABLE) ×2 IMPLANT
HOLDER FOLEY CATH W/STRAP (MISCELLANEOUS) ×1 IMPLANT
IV NS IRRIG 3000ML ARTHROMATIC (IV SOLUTION) ×8 IMPLANT
MANIFOLD NEPTUNE II (INSTRUMENTS) ×2 IMPLANT
NS IRRIG 1000ML POUR BTL (IV SOLUTION) ×1 IMPLANT
PACK CYSTO (CUSTOM PROCEDURE TRAY) ×2 IMPLANT
PLUG CATH AND CAP STER (CATHETERS) ×1 IMPLANT
SYR 30ML LL (SYRINGE) ×1 IMPLANT
TUBING CONNECTING 10 (TUBING) ×2 IMPLANT

## 2013-01-07 NOTE — Interval H&P Note (Signed)
History and Physical Interval Note:  01/07/2013 7:30 AM  Gerald Rosario  has presented today for surgery, with the diagnosis of BPH  The various methods of treatment have been discussed with the patient and family. After consideration of risks, benefits and other options for treatment, the patient has consented to  Procedure(s): TRANSURETHRAL RESECTION OF THE PROSTATE WITH GYRUS INSTRUMENTS (N/A) as a surgical intervention .  The patient's history has been reviewed, patient examined, no change in status, stable for surgery.  I have reviewed the patient's chart and labs.  Questions were answered to the patient's satisfaction.  We discussed risk of worsening scar tissue formation and incontinence among others. We discussed his bladder is weak and is likely the main reason he is not emptying but he needs an open channel in the event he needs CIC and it may possibly improve his emptying.     Antony Haste

## 2013-01-07 NOTE — Progress Notes (Signed)
Patient had acted as if he was calling his neighbor to give him a ride home. RN heard him say "OK in 10 minutes at main entrance." Nursing tech took patient to front door and patient jumped up went to his car after 15 minutes of waiting angry at nursing tech. Stated he drove himself here today and will drive himself home. Patient stated he never told his neighbor he was having surgery.

## 2013-01-07 NOTE — Progress Notes (Signed)
CBI has been off and urine remained clear not even pink. The patient denies any chest pain or SOB. Anesthesia was short and blood loss minimal. His vitals have remained stable. I will let him go home with foley. He tells me he has a friend who will stay with him.

## 2013-01-07 NOTE — Op Note (Signed)
Preoperative diagnosis: BPH, bladder outlet obstruction, hypotonic bladder Postoperative diagnosis: Same  Procedure: TURP  Surgeon: Radek Carnero Type of anesthesia: Gen.  Findings: On exam under anesthesia patient had a smooth benign prostate was enlarged without hard area or nodule. The penis was normal without mass or lesion. Testicles were descended bilaterally and palpably normal.  On cystoscopy there was a narrow area in the bulbar urethra but the 22 [place] and subsequently 25 [place] sheath passed without difficulty. In the prostatic urethra there was significant remaining/read growth of the left lateral lobe with a bridge of tissue over to the right apical region. This was creating 100% visual occlusion of the prostatic urethra. The bladder mucosa was normal without tumor. The bladder contained no stone or foreign body. The ureteral orifices were identified and in their normal orthotopic position. Attention was paid to the UOs both pre-and post resection and there were noted to be normal. No resection was carried distal to the veru.  Description of procedure: After consent was obtained patient brought to the operating room. After adequate anesthesia he was placed in lithotomy position. A timeout was performed to confirm the patient and procedure. An exam under anesthesia was performed. A cystoscope was passed per urethra and the bladder examined. The resectoscope sheath was then passed and a loop was used then to resect the left lateral lobe. It was resected from anterior to posterior from the bladder neck to the mid urethra and then mid urethra down to the veru. There was a good amount of left lateral lobe tissue. After resecting the left lateral lobe it was evident there was still a small amount of right lobe tissue remaining and this was resected in a similar fashion from anterior to posterior bladder neck to mid prostate and and mid prostate down to the veru. A small amount of remaining left  lateral tissue was resected as well as some anterior tissue resected. Hemostasis was excellent. Because the prostatic urethra was short I frequently evacuated the chips, emptied the bladder to continually keep the landmarks of the bladder neck and veru in check. I did not resect the bladder neck apart from a very small amount of the 6:00 position to allow the bladder neck to spring open. On the left side in the proximal prostatic urethra I came down to the depth of the prior resection from the prior TURP which was covered with urothelium and served as a good landmark for the proper depth of resection. Again the bladder was inspected, the UOs noted to be normal. No chips were noted . Hemostasis was excellent. The scope was removed and a 22 [place] three-way hematuria catheter was left to gravity drainage with clear urine  But I did start a slow CBI for wake up and transfer. The patient was awakened and transferred to the PACU in stable condition.   Blood loss: Minimal  Specimen: TURP chips  Drains: 22 French three-way Foley  Complications: None  Disposition: Patient stable to PACU

## 2013-01-07 NOTE — Progress Notes (Signed)
CBI  Completed- Foley catheter to straight drainage

## 2013-01-07 NOTE — Progress Notes (Signed)
Pt in PACU - Urine is clear on a slow CBI after wake up and transfer to PACU. Will run out this bag and cap foley. D/c home in a few hours if urine remains clear.

## 2013-01-07 NOTE — Preoperative (Signed)
Beta Blockers   Reason not to administer Beta Blockers:Not Applicable 

## 2013-01-07 NOTE — Anesthesia Postprocedure Evaluation (Signed)
  Anesthesia Post-op Note  Patient: Gerald Rosario  Procedure(s) Performed: Procedure(s) (LRB): TRANSURETHRAL RESECTION OF THE PROSTATE WITH GYRUS INSTRUMENTS (N/A)  Patient Location: PACU  Anesthesia Type: General  Level of Consciousness: awake and alert   Airway and Oxygen Therapy: Patient Spontanous Breathing  Post-op Pain: mild  Post-op Assessment: Post-op Vital signs reviewed, Patient's Cardiovascular Status Stable, Respiratory Function Stable, Patent Airway and No signs of Nausea or vomiting  Last Vitals:  Filed Vitals:   01/07/13 1030  BP:   Pulse: 65  Temp:   Resp: 17    Post-op Vital Signs: stable   Complications: No apparent anesthesia complications

## 2013-01-07 NOTE — Transfer of Care (Signed)
Immediate Anesthesia Transfer of Care Note  Patient: Gerald Rosario  Procedure(s) Performed: Procedure(s): TRANSURETHRAL RESECTION OF THE PROSTATE WITH GYRUS INSTRUMENTS (N/A)  Patient Location: PACU  Anesthesia Type:General  Level of Consciousness: awake, alert  and oriented  Airway & Oxygen Therapy: Patient Spontanous Breathing and Patient connected to face mask oxygen  Post-op Assessment: Report given to PACU RN and Post -op Vital signs reviewed and stable  Post vital signs: Reviewed and stable  Complications: No apparent anesthesia complications

## 2013-01-08 ENCOUNTER — Encounter (HOSPITAL_COMMUNITY): Payer: Self-pay | Admitting: Urology

## 2013-01-27 ENCOUNTER — Encounter (INDEPENDENT_AMBULATORY_CARE_PROVIDER_SITE_OTHER): Payer: Medicare Other | Admitting: Ophthalmology

## 2013-01-27 DIAGNOSIS — H35039 Hypertensive retinopathy, unspecified eye: Secondary | ICD-10-CM

## 2013-01-27 DIAGNOSIS — H43819 Vitreous degeneration, unspecified eye: Secondary | ICD-10-CM

## 2013-01-27 DIAGNOSIS — I10 Essential (primary) hypertension: Secondary | ICD-10-CM

## 2013-01-27 DIAGNOSIS — E11319 Type 2 diabetes mellitus with unspecified diabetic retinopathy without macular edema: Secondary | ICD-10-CM

## 2013-01-27 DIAGNOSIS — E1139 Type 2 diabetes mellitus with other diabetic ophthalmic complication: Secondary | ICD-10-CM

## 2013-02-15 ENCOUNTER — Other Ambulatory Visit: Payer: Self-pay | Admitting: Cardiology

## 2013-02-20 ENCOUNTER — Encounter: Payer: Self-pay | Admitting: Cardiology

## 2013-02-20 ENCOUNTER — Ambulatory Visit (INDEPENDENT_AMBULATORY_CARE_PROVIDER_SITE_OTHER): Payer: Medicare Other | Admitting: Cardiology

## 2013-02-20 VITALS — BP 104/64 | HR 62 | Ht 67.5 in | Wt 168.0 lb

## 2013-02-20 DIAGNOSIS — E7800 Pure hypercholesterolemia, unspecified: Secondary | ICD-10-CM

## 2013-02-20 DIAGNOSIS — I10 Essential (primary) hypertension: Secondary | ICD-10-CM

## 2013-02-20 DIAGNOSIS — I2510 Atherosclerotic heart disease of native coronary artery without angina pectoris: Secondary | ICD-10-CM

## 2013-02-20 DIAGNOSIS — I255 Ischemic cardiomyopathy: Secondary | ICD-10-CM

## 2013-02-20 DIAGNOSIS — I2589 Other forms of chronic ischemic heart disease: Secondary | ICD-10-CM

## 2013-02-20 NOTE — Assessment & Plan Note (Signed)
Continue aspirin and statin. 

## 2013-02-20 NOTE — Addendum Note (Signed)
Addended by: Williemae Area on: 02/20/2013 12:16 PM   Modules accepted: Orders

## 2013-02-20 NOTE — Assessment & Plan Note (Signed)
Blood pressure controlled. Continue present medications. 

## 2013-02-20 NOTE — Assessment & Plan Note (Signed)
Continue statin. Lipids and liver monitored by primary care. 

## 2013-02-20 NOTE — Patient Instructions (Signed)
Your physician wants you to follow-up in: ONE YEAR WITH DR CRENSHAW You will receive a reminder letter in the mail two months in advance. If you don't receive a letter, please call our office to schedule the follow-up appointment.  

## 2013-02-20 NOTE — Assessment & Plan Note (Signed)
LV function has improved. Continue beta blocker. He has a history of hyperkalemia with ACE inhibitors in reviewing Dr. Rosalyn Charters previous notes.
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
## 2013-03-15 ENCOUNTER — Encounter (HOSPITAL_COMMUNITY): Payer: Self-pay | Admitting: Emergency Medicine

## 2013-03-15 ENCOUNTER — Emergency Department (HOSPITAL_COMMUNITY)
Admission: EM | Admit: 2013-03-15 | Discharge: 2013-03-15 | Disposition: A | Discharge status: Home / Self Care | Payer: Medicare Other | Attending: Emergency Medicine | Admitting: Emergency Medicine

## 2013-03-15 DIAGNOSIS — Z79899 Other long term (current) drug therapy: Secondary | ICD-10-CM | POA: Insufficient documentation

## 2013-03-15 DIAGNOSIS — Y9389 Activity, other specified: Secondary | ICD-10-CM | POA: Insufficient documentation

## 2013-03-15 DIAGNOSIS — E785 Hyperlipidemia, unspecified: Secondary | ICD-10-CM | POA: Insufficient documentation

## 2013-03-15 DIAGNOSIS — Z87448 Personal history of other diseases of urinary system: Secondary | ICD-10-CM | POA: Insufficient documentation

## 2013-03-15 DIAGNOSIS — I129 Hypertensive chronic kidney disease with stage 1 through stage 4 chronic kidney disease, or unspecified chronic kidney disease: Secondary | ICD-10-CM | POA: Insufficient documentation

## 2013-03-15 DIAGNOSIS — I252 Old myocardial infarction: Secondary | ICD-10-CM | POA: Insufficient documentation

## 2013-03-15 DIAGNOSIS — Z95818 Presence of other cardiac implants and grafts: Secondary | ICD-10-CM | POA: Insufficient documentation

## 2013-03-15 DIAGNOSIS — I509 Heart failure, unspecified: Secondary | ICD-10-CM | POA: Insufficient documentation

## 2013-03-15 DIAGNOSIS — W260XXA Contact with knife, initial encounter: Secondary | ICD-10-CM | POA: Insufficient documentation

## 2013-03-15 DIAGNOSIS — Y929 Unspecified place or not applicable: Secondary | ICD-10-CM | POA: Insufficient documentation

## 2013-03-15 DIAGNOSIS — E119 Type 2 diabetes mellitus without complications: Secondary | ICD-10-CM | POA: Insufficient documentation

## 2013-03-15 DIAGNOSIS — Z87891 Personal history of nicotine dependence: Secondary | ICD-10-CM | POA: Insufficient documentation

## 2013-03-15 DIAGNOSIS — Z7982 Long term (current) use of aspirin: Secondary | ICD-10-CM | POA: Insufficient documentation

## 2013-03-15 DIAGNOSIS — I2510 Atherosclerotic heart disease of native coronary artery without angina pectoris: Secondary | ICD-10-CM | POA: Insufficient documentation

## 2013-03-15 DIAGNOSIS — IMO0002 Reserved for concepts with insufficient information to code with codable children: Secondary | ICD-10-CM | POA: Insufficient documentation

## 2013-03-15 DIAGNOSIS — Z8669 Personal history of other diseases of the nervous system and sense organs: Secondary | ICD-10-CM | POA: Insufficient documentation

## 2013-03-15 DIAGNOSIS — J45909 Unspecified asthma, uncomplicated: Secondary | ICD-10-CM | POA: Insufficient documentation

## 2013-03-15 DIAGNOSIS — N189 Chronic kidney disease, unspecified: Secondary | ICD-10-CM | POA: Insufficient documentation

## 2013-03-15 DIAGNOSIS — S61509A Unspecified open wound of unspecified wrist, initial encounter: Secondary | ICD-10-CM | POA: Insufficient documentation

## 2013-03-15 NOTE — ED Notes (Signed)
Pt states that he was opening a bottle of sparkling wine with a knife, the knife slipped and he accidentally cut his left wrist.
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
## 2013-03-31 ENCOUNTER — Other Ambulatory Visit: Payer: Self-pay | Admitting: Internal Medicine

## 2013-05-19 ENCOUNTER — Other Ambulatory Visit: Payer: Self-pay | Admitting: Cardiology

## 2013-05-26 ENCOUNTER — Other Ambulatory Visit: Payer: Self-pay | Admitting: Cardiology

## 2013-06-07 ENCOUNTER — Other Ambulatory Visit: Payer: Self-pay | Admitting: Cardiology

## 2013-06-29 ENCOUNTER — Other Ambulatory Visit: Payer: Self-pay | Admitting: Internal Medicine

## 2013-07-28 ENCOUNTER — Ambulatory Visit (INDEPENDENT_AMBULATORY_CARE_PROVIDER_SITE_OTHER): Payer: Medicare Other | Admitting: Ophthalmology

## 2013-07-28 DIAGNOSIS — H43819 Vitreous degeneration, unspecified eye: Secondary | ICD-10-CM

## 2013-07-28 DIAGNOSIS — E11311 Type 2 diabetes mellitus with unspecified diabetic retinopathy with macular edema: Secondary | ICD-10-CM

## 2013-07-28 DIAGNOSIS — E1165 Type 2 diabetes mellitus with hyperglycemia: Secondary | ICD-10-CM

## 2013-07-28 DIAGNOSIS — I10 Essential (primary) hypertension: Secondary | ICD-10-CM

## 2013-07-28 DIAGNOSIS — E11319 Type 2 diabetes mellitus with unspecified diabetic retinopathy without macular edema: Secondary | ICD-10-CM

## 2013-07-28 DIAGNOSIS — E1139 Type 2 diabetes mellitus with other diabetic ophthalmic complication: Secondary | ICD-10-CM

## 2013-07-28 DIAGNOSIS — H35039 Hypertensive retinopathy, unspecified eye: Secondary | ICD-10-CM

## 2013-08-04 ENCOUNTER — Other Ambulatory Visit (INDEPENDENT_AMBULATORY_CARE_PROVIDER_SITE_OTHER): Payer: Medicare Other | Admitting: Ophthalmology

## 2013-08-04 DIAGNOSIS — E1139 Type 2 diabetes mellitus with other diabetic ophthalmic complication: Secondary | ICD-10-CM

## 2013-08-04 DIAGNOSIS — H3581 Retinal edema: Secondary | ICD-10-CM

## 2013-08-04 DIAGNOSIS — E1165 Type 2 diabetes mellitus with hyperglycemia: Secondary | ICD-10-CM

## 2013-08-18 ENCOUNTER — Other Ambulatory Visit: Payer: Self-pay

## 2013-08-18 ENCOUNTER — Emergency Department (HOSPITAL_COMMUNITY): Payer: Medicare Other

## 2013-08-18 ENCOUNTER — Inpatient Hospital Stay (HOSPITAL_COMMUNITY)
Admission: EM | Admit: 2013-08-18 | Discharge: 2013-09-08 | DRG: 233 | Disposition: A | Discharge status: Skilled Nursing Facility | Payer: Medicare Other | Attending: Thoracic Surgery (Cardiothoracic Vascular Surgery) | Admitting: Thoracic Surgery (Cardiothoracic Vascular Surgery)

## 2013-08-18 ENCOUNTER — Encounter (HOSPITAL_COMMUNITY): Payer: Self-pay | Admitting: Emergency Medicine

## 2013-08-18 DIAGNOSIS — J90 Pleural effusion, not elsewhere classified: Secondary | ICD-10-CM | POA: Diagnosis present

## 2013-08-18 DIAGNOSIS — I4891 Unspecified atrial fibrillation: Secondary | ICD-10-CM | POA: Diagnosis not present

## 2013-08-18 DIAGNOSIS — I252 Old myocardial infarction: Secondary | ICD-10-CM

## 2013-08-18 DIAGNOSIS — S271XXA Traumatic hemothorax, initial encounter: Secondary | ICD-10-CM | POA: Diagnosis present

## 2013-08-18 DIAGNOSIS — J942 Hemothorax: Secondary | ICD-10-CM | POA: Diagnosis present

## 2013-08-18 DIAGNOSIS — R630 Anorexia: Secondary | ICD-10-CM | POA: Diagnosis not present

## 2013-08-18 DIAGNOSIS — Z951 Presence of aortocoronary bypass graft: Secondary | ICD-10-CM

## 2013-08-18 DIAGNOSIS — S21219A Laceration without foreign body of unspecified back wall of thorax without penetration into thoracic cavity, initial encounter: Secondary | ICD-10-CM

## 2013-08-18 DIAGNOSIS — S0083XA Contusion of other part of head, initial encounter: Secondary | ICD-10-CM | POA: Diagnosis present

## 2013-08-18 DIAGNOSIS — S21209A Unspecified open wound of unspecified back wall of thorax without penetration into thoracic cavity, initial encounter: Secondary | ICD-10-CM | POA: Diagnosis present

## 2013-08-18 DIAGNOSIS — Z87891 Personal history of nicotine dependence: Secondary | ICD-10-CM

## 2013-08-18 DIAGNOSIS — E871 Hypo-osmolality and hyponatremia: Secondary | ICD-10-CM | POA: Diagnosis not present

## 2013-08-18 DIAGNOSIS — I2589 Other forms of chronic ischemic heart disease: Secondary | ICD-10-CM | POA: Diagnosis present

## 2013-08-18 DIAGNOSIS — S0101XA Laceration without foreign body of scalp, initial encounter: Secondary | ICD-10-CM | POA: Diagnosis present

## 2013-08-18 DIAGNOSIS — I214 Non-ST elevation (NSTEMI) myocardial infarction: Secondary | ICD-10-CM | POA: Diagnosis present

## 2013-08-18 DIAGNOSIS — K5900 Constipation, unspecified: Secondary | ICD-10-CM | POA: Diagnosis not present

## 2013-08-18 DIAGNOSIS — I129 Hypertensive chronic kidney disease with stage 1 through stage 4 chronic kidney disease, or unspecified chronic kidney disease: Secondary | ICD-10-CM | POA: Diagnosis present

## 2013-08-18 DIAGNOSIS — I255 Ischemic cardiomyopathy: Secondary | ICD-10-CM | POA: Diagnosis present

## 2013-08-18 DIAGNOSIS — J329 Chronic sinusitis, unspecified: Secondary | ICD-10-CM | POA: Diagnosis present

## 2013-08-18 DIAGNOSIS — N189 Chronic kidney disease, unspecified: Secondary | ICD-10-CM | POA: Diagnosis present

## 2013-08-18 DIAGNOSIS — N401 Enlarged prostate with lower urinary tract symptoms: Secondary | ICD-10-CM | POA: Diagnosis not present

## 2013-08-18 DIAGNOSIS — S2242XA Multiple fractures of ribs, left side, initial encounter for closed fracture: Secondary | ICD-10-CM | POA: Diagnosis present

## 2013-08-18 DIAGNOSIS — D72829 Elevated white blood cell count, unspecified: Secondary | ICD-10-CM | POA: Diagnosis present

## 2013-08-18 DIAGNOSIS — R0989 Other specified symptoms and signs involving the circulatory and respiratory systems: Secondary | ICD-10-CM

## 2013-08-18 DIAGNOSIS — R0609 Other forms of dyspnea: Secondary | ICD-10-CM

## 2013-08-18 DIAGNOSIS — I2510 Atherosclerotic heart disease of native coronary artery without angina pectoris: Secondary | ICD-10-CM | POA: Diagnosis present

## 2013-08-18 DIAGNOSIS — R55 Syncope and collapse: Secondary | ICD-10-CM

## 2013-08-18 DIAGNOSIS — S225XXA Flail chest, initial encounter for closed fracture: Secondary | ICD-10-CM

## 2013-08-18 DIAGNOSIS — S2249XA Multiple fractures of ribs, unspecified side, initial encounter for closed fracture: Secondary | ICD-10-CM

## 2013-08-18 DIAGNOSIS — W108XXA Fall (on) (from) other stairs and steps, initial encounter: Secondary | ICD-10-CM | POA: Diagnosis present

## 2013-08-18 DIAGNOSIS — I5189 Other ill-defined heart diseases: Secondary | ICD-10-CM | POA: Diagnosis present

## 2013-08-18 DIAGNOSIS — N138 Other obstructive and reflux uropathy: Secondary | ICD-10-CM | POA: Diagnosis not present

## 2013-08-18 DIAGNOSIS — D62 Acute posthemorrhagic anemia: Secondary | ICD-10-CM | POA: Diagnosis present

## 2013-08-18 DIAGNOSIS — I10 Essential (primary) hypertension: Secondary | ICD-10-CM

## 2013-08-18 DIAGNOSIS — I951 Orthostatic hypotension: Secondary | ICD-10-CM

## 2013-08-18 DIAGNOSIS — I509 Heart failure, unspecified: Secondary | ICD-10-CM | POA: Diagnosis present

## 2013-08-18 DIAGNOSIS — IMO0002 Reserved for concepts with insufficient information to code with codable children: Secondary | ICD-10-CM | POA: Diagnosis present

## 2013-08-18 DIAGNOSIS — J45909 Unspecified asthma, uncomplicated: Secondary | ICD-10-CM | POA: Diagnosis present

## 2013-08-18 DIAGNOSIS — R5381 Other malaise: Secondary | ICD-10-CM | POA: Diagnosis not present

## 2013-08-18 DIAGNOSIS — H9190 Unspecified hearing loss, unspecified ear: Secondary | ICD-10-CM | POA: Diagnosis present

## 2013-08-18 DIAGNOSIS — I5022 Chronic systolic (congestive) heart failure: Secondary | ICD-10-CM | POA: Diagnosis present

## 2013-08-18 DIAGNOSIS — N179 Acute kidney failure, unspecified: Secondary | ICD-10-CM | POA: Diagnosis present

## 2013-08-18 DIAGNOSIS — E119 Type 2 diabetes mellitus without complications: Secondary | ICD-10-CM | POA: Diagnosis present

## 2013-08-18 DIAGNOSIS — R339 Retention of urine, unspecified: Secondary | ICD-10-CM | POA: Diagnosis present

## 2013-08-18 DIAGNOSIS — E875 Hyperkalemia: Secondary | ICD-10-CM | POA: Diagnosis present

## 2013-08-18 DIAGNOSIS — E785 Hyperlipidemia, unspecified: Secondary | ICD-10-CM | POA: Diagnosis present

## 2013-08-18 DIAGNOSIS — Z79899 Other long term (current) drug therapy: Secondary | ICD-10-CM

## 2013-08-18 DIAGNOSIS — Z7982 Long term (current) use of aspirin: Secondary | ICD-10-CM

## 2013-08-18 DIAGNOSIS — Y92009 Unspecified place in unspecified non-institutional (private) residence as the place of occurrence of the external cause: Secondary | ICD-10-CM

## 2013-08-18 DIAGNOSIS — S0100XA Unspecified open wound of scalp, initial encounter: Secondary | ICD-10-CM | POA: Diagnosis present

## 2013-08-18 DIAGNOSIS — Y9301 Activity, walking, marching and hiking: Secondary | ICD-10-CM

## 2013-08-18 DIAGNOSIS — I2582 Chronic total occlusion of coronary artery: Secondary | ICD-10-CM | POA: Diagnosis present

## 2013-08-18 HISTORY — DX: Essential (primary) hypertension: I10

## 2013-08-18 HISTORY — DX: Multiple fractures of ribs, left side, initial encounter for closed fracture: S22.42XA

## 2013-08-18 HISTORY — DX: Syncope and collapse: R55

## 2013-08-18 HISTORY — DX: Other disorders of lung: J98.4

## 2013-08-18 HISTORY — DX: Chronic systolic (congestive) heart failure: I50.22

## 2013-08-18 HISTORY — DX: Presence of aortocoronary bypass graft: Z95.1

## 2013-08-18 LAB — BASIC METABOLIC PANEL
BUN: 22 mg/dL (ref 6–23)
CO2: 22 mEq/L (ref 19–32)
Calcium: 9.3 mg/dL (ref 8.4–10.5)
Chloride: 100 mEq/L (ref 96–112)
Creatinine, Ser: 1.48 mg/dL — ABNORMAL HIGH (ref 0.50–1.35)
GFR calc Af Amer: 54 mL/min — ABNORMAL LOW (ref >90)
GFR calc non Af Amer: 47 mL/min — ABNORMAL LOW (ref >90)
Glucose, Bld: 184 mg/dL — ABNORMAL HIGH (ref 70–99)
Potassium: 4.9 mEq/L (ref 3.7–5.3)
Sodium: 138 mEq/L (ref 137–147)

## 2013-08-18 LAB — GLUCOSE, CAPILLARY
Glucose-Capillary: 161 mg/dL — ABNORMAL HIGH (ref 70–99)
Glucose-Capillary: 112 mg/dL — ABNORMAL HIGH (ref 70–99)

## 2013-08-18 LAB — URINALYSIS, ROUTINE W REFLEX MICROSCOPIC
Bilirubin Urine: NEGATIVE
Glucose, UA: NEGATIVE mg/dL
Hgb urine dipstick: NEGATIVE
Ketones, ur: 15 mg/dL — AB
Nitrite: NEGATIVE
Protein, ur: NEGATIVE mg/dL
Specific Gravity, Urine: 1.014 (ref 1.005–1.030)
Urobilinogen, UA: 0.2 mg/dL (ref 0.0–1.0)
pH: 5 (ref 5.0–8.0)

## 2013-08-18 LAB — CBC WITH DIFFERENTIAL/PLATELET
Basophils Absolute: 0 10*3/uL (ref 0.0–0.1)
Basophils Relative: 0 % (ref 0–1)
Eosinophils Absolute: 0 10*3/uL (ref 0.0–0.7)
Eosinophils Relative: 0 % (ref 0–5)
HCT: 36.8 % — ABNORMAL LOW (ref 39.0–52.0)
Hemoglobin: 11.9 g/dL — ABNORMAL LOW (ref 13.0–17.0)
Lymphocytes Relative: 6 % — ABNORMAL LOW (ref 12–46)
Lymphs Abs: 0.7 10*3/uL (ref 0.7–4.0)
MCH: 27.9 pg (ref 26.0–34.0)
MCHC: 32.3 g/dL (ref 30.0–36.0)
MCV: 86.2 fL (ref 78.0–100.0)
Monocytes Absolute: 0.9 10*3/uL (ref 0.1–1.0)
Monocytes Relative: 7 % (ref 3–12)
Neutro Abs: 11.3 10*3/uL — ABNORMAL HIGH (ref 1.7–7.7)
Neutrophils Relative %: 87 % — ABNORMAL HIGH (ref 43–77)
Platelets: 279 10*3/uL (ref 150–400)
RBC: 4.27 MIL/uL (ref 4.22–5.81)
RDW: 14.5 % (ref 11.5–15.5)
WBC: 12.9 10*3/uL — ABNORMAL HIGH (ref 4.0–10.5)

## 2013-08-18 LAB — I-STAT ARTERIAL BLOOD GAS, ED
Acid-base deficit: 4 mmol/L — ABNORMAL HIGH (ref 0.0–2.0)
Bicarbonate: 20.5 mEq/L (ref 20.0–24.0)
O2 Saturation: 92 %
TCO2: 22 mmol/L (ref 0–100)
pCO2 arterial: 34.5 mmHg — ABNORMAL LOW (ref 35.0–45.0)
pH, Arterial: 7.382 (ref 7.350–7.450)
pO2, Arterial: 64 mmHg — ABNORMAL LOW (ref 80.0–100.0)

## 2013-08-18 LAB — HEPATIC FUNCTION PANEL
ALT: 17 U/L (ref 0–53)
AST: 32 U/L (ref 0–37)
Albumin: 3.5 g/dL (ref 3.5–5.2)
Alkaline Phosphatase: 50 U/L (ref 39–117)
Bilirubin, Direct: <0.2 mg/dL (ref 0.0–0.3)
Total Bilirubin: 0.8 mg/dL (ref 0.3–1.2)
Total Protein: 7.1 g/dL (ref 6.0–8.3)

## 2013-08-18 LAB — CK TOTAL AND CKMB (NOT AT ARMC)
CK, MB: 7 ng/mL (ref 0.3–4.0)
Relative Index: 1.3 (ref 0.0–2.5)
Total CK: 537 U/L — ABNORMAL HIGH (ref 7–232)

## 2013-08-18 LAB — TROPONIN I
Troponin I: 0.32 ng/mL (ref <0.30)
Troponin I: 0.94 ng/mL (ref <0.30)

## 2013-08-18 LAB — I-STAT CHEM 8, ED
BUN: 21 mg/dL (ref 6–23)
Calcium, Ion: 1.17 mmol/L (ref 1.13–1.30)
Chloride: 105 mEq/L (ref 96–112)
Creatinine, Ser: 1.6 mg/dL — ABNORMAL HIGH (ref 0.50–1.35)
Glucose, Bld: 178 mg/dL — ABNORMAL HIGH (ref 70–99)
HCT: 40 % (ref 39.0–52.0)
Hemoglobin: 13.6 g/dL (ref 13.0–17.0)
Potassium: 4.8 mEq/L (ref 3.7–5.3)
Sodium: 139 mEq/L (ref 137–147)
TCO2: 22 mmol/L (ref 0–100)

## 2013-08-18 LAB — RAPID URINE DRUG SCREEN, HOSP PERFORMED
Amphetamines: NOT DETECTED
Barbiturates: NOT DETECTED
Benzodiazepines: NOT DETECTED
Cocaine: NOT DETECTED
Opiates: POSITIVE — AB
Tetrahydrocannabinol: NOT DETECTED

## 2013-08-18 LAB — PROTIME-INR
INR: 1.11 (ref 0.00–1.49)
Prothrombin Time: 14.1 seconds (ref 11.6–15.2)

## 2013-08-18 LAB — HEMOGLOBIN A1C
Hgb A1c MFr Bld: 7.4 % — ABNORMAL HIGH (ref <5.7)
Mean Plasma Glucose: 166 mg/dL — ABNORMAL HIGH (ref <117)

## 2013-08-18 LAB — I-STAT CREATININE, ED: Creatinine, Ser: 1.7 mg/dL — ABNORMAL HIGH (ref 0.50–1.35)

## 2013-08-18 LAB — I-STAT TROPONIN, ED: Troponin i, poc: 0 ng/mL (ref 0.00–0.08)

## 2013-08-18 LAB — SODIUM, URINE, RANDOM: Sodium, Ur: 49 mEq/L

## 2013-08-18 LAB — URINE MICROSCOPIC-ADD ON

## 2013-08-18 LAB — CREATININE, URINE, RANDOM: Creatinine, Urine: 59.72 mg/dL

## 2013-08-18 LAB — LACTIC ACID, PLASMA
Lactic Acid, Venous: 2.4 mmol/L — ABNORMAL HIGH (ref 0.5–2.2)
Lactic Acid, Venous: 2 mmol/L (ref 0.5–2.2)

## 2013-08-18 LAB — MAGNESIUM: Magnesium: 2 mg/dL (ref 1.5–2.5)

## 2013-08-18 LAB — ETHANOL: Alcohol, Ethyl (B): <11 mg/dL (ref 0–11)

## 2013-08-18 LAB — APTT: aPTT: 29 seconds (ref 24–37)

## 2013-08-18 LAB — MRSA PCR SCREENING: MRSA by PCR: NEGATIVE

## 2013-08-18 LAB — CBG MONITORING, ED: Glucose-Capillary: 173 mg/dL — ABNORMAL HIGH (ref 70–99)

## 2013-08-18 MED ORDER — OXYCODONE HCL 5 MG PO TABS
5.0000 mg | ORAL_TABLET | ORAL | Status: DC | PRN
  Administered 2013-08-18 – 2013-08-20 (×4): 10 mg via ORAL
  Administered 2013-08-21 (×2): 15 mg via ORAL
  Administered 2013-08-22: 10 mg via ORAL
  Filled 2013-08-18 (×4): qty 2
  Filled 2013-08-18: qty 3
  Filled 2013-08-18: qty 2
  Filled 2013-08-18: qty 3

## 2013-08-18 MED ORDER — SODIUM CHLORIDE 0.9 % IV SOLN
250.0000 mL | INTRAVENOUS | Status: AC
  Administered 2013-08-18: 250 mL via INTRAVENOUS

## 2013-08-18 MED ORDER — INSULIN ASPART 100 UNIT/ML ~~LOC~~ SOLN
0.0000 [IU] | Freq: Three times a day (TID) | SUBCUTANEOUS | Status: DC
  Administered 2013-08-18: 2 [IU] via SUBCUTANEOUS
  Administered 2013-08-19: 1 [IU] via SUBCUTANEOUS
  Administered 2013-08-19 – 2013-08-20 (×4): 2 [IU] via SUBCUTANEOUS
  Administered 2013-08-20: 3 [IU] via SUBCUTANEOUS
  Administered 2013-08-21 (×2): 2 [IU] via SUBCUTANEOUS
  Administered 2013-08-22: 3 [IU] via SUBCUTANEOUS
  Administered 2013-08-22 – 2013-08-23 (×3): 2 [IU] via SUBCUTANEOUS
  Administered 2013-08-23: 1 [IU] via SUBCUTANEOUS
  Administered 2013-08-24 (×3): 2 [IU] via SUBCUTANEOUS
  Administered 2013-08-25: 1 [IU] via SUBCUTANEOUS
  Administered 2013-08-25 (×2): 2 [IU] via SUBCUTANEOUS
  Administered 2013-08-26 (×2): 1 [IU] via SUBCUTANEOUS

## 2013-08-18 MED ORDER — METOPROLOL TARTRATE 25 MG PO TABS
37.5000 mg | ORAL_TABLET | Freq: Two times a day (BID) | ORAL | Status: DC
  Administered 2013-08-18 – 2013-08-19 (×3): 37.5 mg via ORAL
  Filled 2013-08-18 (×4): qty 1
  Filled 2013-08-18: qty 2
  Filled 2013-08-18: qty 1

## 2013-08-18 MED ORDER — ASPIRIN 81 MG PO TABS: 81.0000 mg | ORAL_TABLET | Freq: Every morning | ORAL | Status: DC

## 2013-08-18 MED ORDER — BACITRACIN ZINC 500 UNIT/GM EX OINT
TOPICAL_OINTMENT | Freq: Two times a day (BID) | CUTANEOUS | Status: DC
  Administered 2013-08-18: 1 via TOPICAL
  Administered 2013-08-19 – 2013-08-21 (×5): via TOPICAL
  Administered 2013-08-21: 1 via TOPICAL
  Administered 2013-08-21: 23:00:00 via TOPICAL
  Administered 2013-08-22: 1 via TOPICAL
  Administered 2013-08-22: 23:00:00 via TOPICAL
  Administered 2013-08-23 (×2): 1 via TOPICAL
  Administered 2013-08-24: 22:00:00 via TOPICAL
  Administered 2013-08-24: 1 via TOPICAL
  Administered 2013-08-25 – 2013-08-27 (×6): via TOPICAL
  Filled 2013-08-18: qty 15
  Filled 2013-08-18 (×2): qty 28.35

## 2013-08-18 MED ORDER — SODIUM CHLORIDE 0.9 % IV SOLN: 250.0000 mL | INTRAVENOUS | Status: DC | PRN

## 2013-08-18 MED ORDER — ONDANSETRON HCL 4 MG/2ML IJ SOLN
4.0000 mg | Freq: Four times a day (QID) | INTRAMUSCULAR | Status: DC | PRN
Start: 2013-08-18 — End: 2013-08-20
  Administered 2013-08-19: 4 mg via INTRAVENOUS
  Filled 2013-08-18: qty 2

## 2013-08-18 MED ORDER — MORPHINE SULFATE 4 MG/ML IJ SOLN
4.0000 mg | Freq: Once | INTRAMUSCULAR | Status: AC
  Administered 2013-08-18: 4 mg via INTRAVENOUS
  Filled 2013-08-18: qty 1

## 2013-08-18 MED ORDER — MOMETASONE FURO-FORMOTEROL FUM 100-5 MCG/ACT IN AERO
2.0000 | INHALATION_SPRAY | Freq: Two times a day (BID) | RESPIRATORY_TRACT | Status: DC
  Administered 2013-08-18 – 2013-08-27 (×18): 2 via RESPIRATORY_TRACT
  Filled 2013-08-18 (×3): qty 8.8

## 2013-08-18 MED ORDER — CEFAZOLIN SODIUM 1-5 GM-% IV SOLN
1.0000 g | Freq: Once | INTRAVENOUS | Status: AC
  Administered 2013-08-18: 1 g via INTRAVENOUS
  Filled 2013-08-18: qty 50

## 2013-08-18 MED ORDER — ASPIRIN EC 81 MG PO TBEC
81.0000 mg | DELAYED_RELEASE_TABLET | Freq: Every day | ORAL | Status: DC
  Administered 2013-08-19 – 2013-08-27 (×8): 81 mg via ORAL
  Filled 2013-08-18 (×10): qty 1

## 2013-08-18 MED ORDER — HYDROMORPHONE HCL PF 1 MG/ML IJ SOLN
1.0000 mg | Freq: Once | INTRAMUSCULAR | Status: AC
  Administered 2013-08-18: 1 mg via INTRAVENOUS
  Filled 2013-08-18: qty 1

## 2013-08-18 MED ORDER — INSULIN GLARGINE 100 UNIT/ML ~~LOC~~ SOLN
10.0000 [IU] | Freq: Every day | SUBCUTANEOUS | Status: DC
  Administered 2013-08-18 – 2013-08-27 (×10): 10 [IU] via SUBCUTANEOUS
  Filled 2013-08-18 (×12): qty 0.1

## 2013-08-18 MED ORDER — KETOROLAC TROMETHAMINE 30 MG/ML IJ SOLN
30.0000 mg | Freq: Once | INTRAMUSCULAR | Status: DC
  Filled 2013-08-18: qty 1

## 2013-08-18 MED ORDER — METHOCARBAMOL 1000 MG/10ML IJ SOLN
1000.0000 mg | Freq: Once | INTRAMUSCULAR | Status: AC
  Administered 2013-08-18: 1000 mg via INTRAVENOUS
  Filled 2013-08-18: qty 10

## 2013-08-18 MED ORDER — METHOCARBAMOL 750 MG PO TABS
1500.0000 mg | ORAL_TABLET | Freq: Four times a day (QID) | ORAL | Status: DC
  Administered 2013-08-18 – 2013-08-27 (×35): 1500 mg via ORAL
  Filled 2013-08-18 (×39): qty 2

## 2013-08-18 MED ORDER — SIMVASTATIN 20 MG PO TABS
20.0000 mg | ORAL_TABLET | Freq: Every day | ORAL | Status: DC
  Administered 2013-08-18: 20 mg via ORAL
  Filled 2013-08-18 (×3): qty 1

## 2013-08-18 MED ORDER — SODIUM CHLORIDE 0.9 % IJ SOLN
3.0000 mL | Freq: Two times a day (BID) | INTRAMUSCULAR | Status: DC
  Administered 2013-08-18: 3 mL via INTRAVENOUS

## 2013-08-18 MED ORDER — SODIUM CHLORIDE 0.9 % IJ SOLN
3.0000 mL | Freq: Two times a day (BID) | INTRAMUSCULAR | Status: DC
Start: 2013-08-18 — End: 2013-08-28
  Administered 2013-08-19 – 2013-08-27 (×13): 3 mL via INTRAVENOUS

## 2013-08-18 MED ORDER — TRAMADOL HCL 50 MG PO TABS
100.0000 mg | ORAL_TABLET | Freq: Four times a day (QID) | ORAL | Status: DC
  Administered 2013-08-18 – 2013-08-28 (×39): 100 mg via ORAL
  Filled 2013-08-18 (×40): qty 2

## 2013-08-18 MED ORDER — PANTOPRAZOLE SODIUM 40 MG PO TBEC
40.0000 mg | DELAYED_RELEASE_TABLET | Freq: Every day | ORAL | Status: DC
  Administered 2013-08-18 – 2013-08-27 (×10): 40 mg via ORAL
  Filled 2013-08-18 (×10): qty 1

## 2013-08-18 MED ORDER — SODIUM CHLORIDE 0.9 % IJ SOLN
3.0000 mL | INTRAMUSCULAR | Status: DC | PRN
  Filled 2013-08-18: qty 3

## 2013-08-18 MED ORDER — SODIUM CHLORIDE 0.9 % IV BOLUS (SEPSIS)
1000.0000 mL | Freq: Once | INTRAVENOUS | Status: AC
  Administered 2013-08-18: 1000 mL via INTRAVENOUS

## 2013-08-18 MED ORDER — ONDANSETRON HCL 4 MG/2ML IJ SOLN
4.0000 mg | Freq: Once | INTRAMUSCULAR | Status: AC
  Administered 2013-08-18: 4 mg via INTRAVENOUS
  Filled 2013-08-18: qty 2

## 2013-08-18 MED ORDER — TETANUS-DIPHTH-ACELL PERTUSSIS 5-2.5-18.5 LF-MCG/0.5 IM SUSP
0.5000 mL | Freq: Once | INTRAMUSCULAR | Status: DC
  Filled 2013-08-18: qty 0.5
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
## 2013-08-18 NOTE — Consult Note (Signed)
Patient is a lot of pain.  Clinically he does not have a flail chest.  He has multiple segmental rib fractures.  Pain control will be the key.  Will check IS for adequate ventilation.  This patient has been seen and I agree with the findings and treatment plan.  Kathryne Eriksson. Dahlia Bailiff, MD, [place] [phone] (pager) [phone] (direct pager) Trauma Surgeon

## 2013-08-18 NOTE — ED Notes (Signed)
Encouraged pt to urinate

## 2013-08-18 NOTE — Procedures (Signed)
Procedure: Laceration repair of back, complex, 15cm  Indication: Lacerations of back  Surgeon: Silvestre Gunner, PA-C  Assist: None  Anesthesia: 61ml 1% lidocaine with epinephrine  EBL: Minimal  Complications: None  Findings: After verbal consent was obtained the patient was positioned on his right side. Local anesthetic was infiltrated into the subcutaneous space around the lacerations to effect a field block. Both lacerations and the surrounding skin were then scrubbed with a chlorhexidine surgical prep. The area was sterilely draped. Interrupted 3-0 nylon sutures were placed in the right back laceration with good wound edge approximation. These were placed asymmetrically given the nature of the laceration. Attention was turned to the skin tear on the left side. The flap was tacked down as best I could. There is a decent chance it will necrose with time. The area was then coated with antibiotic ointment and dressed. The patient tolerated the procedure well.    Lisette Abu, PA-C Pager: [phone] General Trauma PA Pager: [phone]

## 2013-08-18 NOTE — ED Notes (Addendum)
CRITICAL CK & CKMB AND CRITICALl TROPONIN LEVEL RECEIVED BY [facility] SHERWOOD RN @ [phone]   This RN paged Admitting.

## 2013-08-18 NOTE — ED Notes (Signed)
Gave pt crackers and peanut butter

## 2013-08-18 NOTE — ED Notes (Signed)
Will hold on oxycodone per patient request 30 min to allow protonix to work.

## 2013-08-18 NOTE — Progress Notes (Signed)
Bilateral carotid artery duplex:  1-39% ICA stenosis.  Vertebral artery flow is antegrade.     

## 2013-08-18 NOTE — ED Notes (Signed)
Pt will be moved to [place] C room 23.
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
## 2013-08-18 NOTE — ED Notes (Signed)
Applied bacitracin to pt's back on sutures. Applied nonadherent gauze and abd pad to over site.
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
## 2013-08-18 NOTE — ED Notes (Signed)
Admitting MD at bedside.
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
## 2013-08-18 NOTE — ED Notes (Signed)
Colletta Maryland RN from Floor updated on plan and labs.

## 2013-08-18 NOTE — ED Notes (Signed)
Dr. Ellwood Dense made aware of critical lab values.

## 2013-08-18 NOTE — Progress Notes (Signed)
Physical Therapy Treatment Patient Details Name: Gerald Rosario MRN: [id number] DOB: 05-26-1945 Today's Date: 08/18/2013    History of Present Illness Pt is a 68 y/o male admitted s/p syncope and fall at home, in which he sustained L sided rib fractures and a laceration to his upper thoracic area.    PT Comments    Pt admitted with the above. Pt currently with functional limitations due to the deficits listed below (see PT Problem List). At the time of PT eval pt was very nauseated and pain was limiting his mobility. Anticipate pt will progress well. Pt will benefit from skilled PT to increase their independence and safety with mobility to allow discharge to the venue listed below.    Follow Up Recommendations  No PT follow up     Equipment Recommendations   (TBD by progress with mobility)    Recommendations for Other Services       Precautions / Restrictions Precautions Precautions: Fall Precaution Comments: L rib fractures, mid-line upper thoracic laceration Restrictions Weight Bearing Restrictions: No    Mobility  Bed Mobility Overal bed mobility: Needs Assistance Bed Mobility: Supine to Sit;Sit to Supine     Supine to sit: Min assist Sit to supine: Min assist   General bed mobility comments: Pt exited bed on R side due to L rib fx's. Assist for trunk elevation.   Transfers Overall transfer level: Needs assistance Equipment used: None Transfers: Sit to/from Stand Sit to Stand: Min assist         General transfer comment: Assist for R-sided trunk support as pt powered-up to full standing.   Ambulation/Gait Ambulation/Gait assistance: Min guard Ambulation Distance (Feet): 5 Feet Assistive device: None       General Gait Details: Pt was able to side step to FOB and then back up to [facility]. Limited by pain and lines.    Stairs            Wheelchair Mobility    Modified Rankin (Stroke Patients Only)       Balance Overall balance assessment: No  apparent balance deficits (not formally assessed)                                  Cognition Arousal/Alertness: Awake/alert Behavior During Therapy: WFL for tasks assessed/performed Overall Cognitive Status: Within Functional Limits for tasks assessed                      Exercises      General Comments        Pertinent Vitals/Pain See above    Home Living Family/patient expects to be discharged to:: Private residence Living Arrangements: Alone Available Help at Discharge: Available 24 hours/day;Family;Friend(s) Type of Home: Apartment ([place]) Home Access: Stairs to enter Entrance Stairs-Rails: Right Home Layout: 1/2 bath on main level;Bed/bath upstairs Home Equipment: None      Prior Function Level of Independence: Independent      Comments: Retired, still driving   PT Goals (current goals can now be found in the care plan section) Acute Rehab PT Goals Patient Stated Goal: Decreased pain PT Goal Formulation: With patient Time For Goal Achievement: 08/25/13 Potential to Achieve Goals: Good    Frequency  Min 3X/week    PT Plan      Co-evaluation             End of Session Equipment Utilized During Treatment: Other (comment) (Did  not use gait belt due to rib fx's) Activity Tolerance: Patient limited by pain (limited by nausea) Patient left: in bed;with bed alarm set;with call bell/phone within reach     Time: 1602-1628 PT Time Calculation (min): 26 min  Charges:  $Therapeutic Activity: 8-22 mins                    G Codes:      Jolyn Lent 29-Aug-2013, 4:41 PM  Jolyn Lent, PT, DPT Acute Rehabilitation Services Pager: [phone]

## 2013-08-18 NOTE — ED Notes (Signed)
Bed control called.

## 2013-08-18 NOTE — ED Notes (Signed)
Attempted report 

## 2013-08-19 ENCOUNTER — Encounter (HOSPITAL_COMMUNITY): Payer: Self-pay | Admitting: Interventional Cardiology

## 2013-08-19 ENCOUNTER — Inpatient Hospital Stay (HOSPITAL_COMMUNITY): Payer: Medicare Other

## 2013-08-19 DIAGNOSIS — R55 Syncope and collapse: Principal | ICD-10-CM

## 2013-08-19 DIAGNOSIS — D72829 Elevated white blood cell count, unspecified: Secondary | ICD-10-CM

## 2013-08-19 DIAGNOSIS — I214 Non-ST elevation (NSTEMI) myocardial infarction: Secondary | ICD-10-CM | POA: Diagnosis present

## 2013-08-19 DIAGNOSIS — I2510 Atherosclerotic heart disease of native coronary artery without angina pectoris: Secondary | ICD-10-CM

## 2013-08-19 DIAGNOSIS — I959 Hypotension, unspecified: Secondary | ICD-10-CM

## 2013-08-19 DIAGNOSIS — E119 Type 2 diabetes mellitus without complications: Secondary | ICD-10-CM

## 2013-08-19 DIAGNOSIS — J329 Chronic sinusitis, unspecified: Secondary | ICD-10-CM

## 2013-08-19 DIAGNOSIS — W19XXXA Unspecified fall, initial encounter: Secondary | ICD-10-CM

## 2013-08-19 DIAGNOSIS — S21209A Unspecified open wound of unspecified back wall of thorax without penetration into thoracic cavity, initial encounter: Secondary | ICD-10-CM

## 2013-08-19 DIAGNOSIS — I059 Rheumatic mitral valve disease, unspecified: Secondary | ICD-10-CM

## 2013-08-19 DIAGNOSIS — S2249XA Multiple fractures of ribs, unspecified side, initial encounter for closed fracture: Secondary | ICD-10-CM

## 2013-08-19 DIAGNOSIS — I10 Essential (primary) hypertension: Secondary | ICD-10-CM

## 2013-08-19 DIAGNOSIS — N179 Acute kidney failure, unspecified: Secondary | ICD-10-CM

## 2013-08-19 DIAGNOSIS — S0100XA Unspecified open wound of scalp, initial encounter: Secondary | ICD-10-CM

## 2013-08-19 LAB — BASIC METABOLIC PANEL
BUN: 19 mg/dL (ref 6–23)
CO2: 22 mEq/L (ref 19–32)
Calcium: 8.3 mg/dL — ABNORMAL LOW (ref 8.4–10.5)
Chloride: 102 mEq/L (ref 96–112)
Creatinine, Ser: 1.18 mg/dL (ref 0.50–1.35)
GFR calc Af Amer: 71 mL/min — ABNORMAL LOW (ref >90)
GFR calc non Af Amer: 62 mL/min — ABNORMAL LOW (ref >90)
Glucose, Bld: 88 mg/dL (ref 70–99)
Potassium: 4.5 mEq/L (ref 3.7–5.3)
Sodium: 136 mEq/L — ABNORMAL LOW (ref 137–147)

## 2013-08-19 LAB — CBC
HCT: 34.4 % — ABNORMAL LOW (ref 39.0–52.0)
Hemoglobin: 11 g/dL — ABNORMAL LOW (ref 13.0–17.0)
MCH: 28.4 pg (ref 26.0–34.0)
MCHC: 32 g/dL (ref 30.0–36.0)
MCV: 88.9 fL (ref 78.0–100.0)
Platelets: 232 10*3/uL (ref 150–400)
RBC: 3.87 MIL/uL — ABNORMAL LOW (ref 4.22–5.81)
RDW: 14.9 % (ref 11.5–15.5)
WBC: 9.2 10*3/uL (ref 4.0–10.5)

## 2013-08-19 LAB — GLUCOSE, CAPILLARY
Glucose-Capillary: 140 mg/dL — ABNORMAL HIGH (ref 70–99)
Glucose-Capillary: 146 mg/dL — ABNORMAL HIGH (ref 70–99)
Glucose-Capillary: 148 mg/dL — ABNORMAL HIGH (ref 70–99)
Glucose-Capillary: 162 mg/dL — ABNORMAL HIGH (ref 70–99)
Glucose-Capillary: 196 mg/dL — ABNORMAL HIGH (ref 70–99)
Glucose-Capillary: 83 mg/dL (ref 70–99)

## 2013-08-19 LAB — TROPONIN I
Troponin I: 0.59 ng/mL (ref <0.30)
Troponin I: 0.7 ng/mL (ref <0.30)
Troponin I: 0.79 ng/mL (ref <0.30)
Troponin I: 1.72 ng/mL (ref <0.30)

## 2013-08-19 LAB — UREA NITROGEN, URINE: Urea Nitrogen, Ur: 285 mg/dL

## 2013-08-19 MED ORDER — CALCIUM CARBONATE ANTACID 500 MG PO CHEW
2.0000 | CHEWABLE_TABLET | Freq: Four times a day (QID) | ORAL | Status: DC | PRN
  Administered 2013-08-19: 400 mg via ORAL
  Filled 2013-08-19 (×2): qty 2

## 2013-08-19 MED ORDER — ATORVASTATIN CALCIUM 40 MG PO TABS
40.0000 mg | ORAL_TABLET | Freq: Every day | ORAL | Status: DC
  Administered 2013-08-19 – 2013-08-26 (×8): 40 mg via ORAL
  Filled 2013-08-19 (×10): qty 1
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
## 2013-08-19 NOTE — Progress Notes (Signed)
08/19/2013 Patient had 1200 cc out from In  and Out cath. [facility] RN.

## 2013-08-19 NOTE — Progress Notes (Signed)
    Day 1 of stay      Patient name: Gerald Rosario  Medical record number: [id number]  Date of birth: 1945-06-11   Dr August Saucer admission note has been accidentally signed by the cardiology attending Dr Ellyn Hack. I have read and reviewed it. I agree with her history, exam and care management documentation. I have reviewed labs and imaging.   Assessment and Plan In addition to the resident note:   Syncope - from prior cardiac history and current HPI, the syncope appears to be cardiogenic in origin. Likely causes - acute MI or vasovagal due to pain. He could be orthostatic, however it is not possible to get that done due to his rib fractures. EP study suggested by cardiology.   Rib fractures s/p fall - due to the nature and extent of fractures, there is a fear for pneumothorax and/or flail chest. Monitor for sudden hypoxia.     Hemothorax, possible - s/p rib fracutre. We continue to monitor.    NSTEMI - No anticoagulation due to hemotherax. Cardiology on board. Appreciate recs.   I have discussed the care of this patient with my IM team residents. Please see the resident note for details.  Merwyn Hodapp 08/19/2013, 12:18 PM.

## 2013-08-19 NOTE — Progress Notes (Signed)
SUBJECTIVE:  Chest is sore with any movement.  OBJECTIVE:   Vitals:   Filed Vitals:   08/18/13 2145 08/19/13 0000 08/19/13 0400 08/19/13 0800  BP: 127/68 114/68 124/57   Pulse: 83 79 72   Temp:  99.1 F (37.3 C) 99 F (37.2 C) 98.3 F (36.8 C)  TempSrc:  Oral Axillary Oral  Resp:  11 12   Height:      Weight:   185 lb 3 oz (84 kg)   SpO2:  92% 94%    I&O's:   Intake/Output Summary (Last 24 hours) at 08/19/13 0942 Last data filed at 08/19/13 0600  Gross per 24 hour  Intake    908 ml  Output    800 ml  Net    108 ml   TELEMETRY: Reviewed telemetry pt in NSR:     PHYSICAL EXAM General: Well developed, well nourished, in no acute distress Head:   Normal cephalic and atramatic  Lungs:   Clear bilaterally to auscultation. Heart:   HRRR S1 S2  No JVD.   Abdomen: abdomen soft and non-tender Msk:  Back normal,  Normal strength and tone for age. Extremities:   No edema.   Neuro: Alert and oriented. Psych:  Normal affect, responds appropriately   LABS: Basic Metabolic Panel:  Recent Labs  08/18/13 0643 08/18/13 0658 08/18/13 1345 08/19/13 0059  NA 138 139  --  136*  K 4.9 4.8  --  4.5  CL 100 105  --  102  CO2 22  --   --  22  GLUCOSE 184* 178*  --  88  BUN 22 21  --  19  CREATININE 1.48* 1.60*  1.70*  --  1.18  CALCIUM 9.3  --   --  8.3*  MG  --   --  2.0  --    Liver Function Tests:  Recent Labs  08/18/13 1345  AST 32  ALT 17  ALKPHOS 50  BILITOT 0.8  PROT 7.1  ALBUMIN 3.5   No results found for this basename: LIPASE, AMYLASE,  in the last 72 hours CBC:  Recent Labs  08/18/13 0643 08/18/13 0658 08/19/13 0059  WBC 12.9*  --  9.2  NEUTROABS 11.3*  --   --   HGB 11.9* 13.6 11.0*  HCT 36.8* 40.0 34.4*  MCV 86.2  --  88.9  PLT 279  --  232   Cardiac Enzymes:  Recent Labs  08/18/13 1345 08/18/13 1823 08/19/13 0059  CKTOTAL 537*  --   --   CKMB 7.0*  --   --   TROPONINI 0.32* 0.94* 1.72*   BNP: No components found with this  basename: POCBNP,  D-Dimer: No results found for this basename: DDIMER,  in the last 72 hours Hemoglobin A1C:  Recent Labs  08/18/13 1345  HGBA1C 7.4*   Fasting Lipid Panel: No results found for this basename: CHOL, HDL, LDLCALC, TRIG, CHOLHDL, LDLDIRECT,  in the last 72 hours Thyroid Function Tests: No results found for this basename: TSH, T4TOTAL, FREET3, T3FREE, THYROIDAB,  in the last 72 hours Anemia Panel: No results found for this basename: VITAMINB12, FOLATE, FERRITIN, TIBC, IRON, RETICCTPCT,  in the last 72 hours Coag Panel:   Lab Results  Component Value Date   INR 1.11 08/18/2013   INR 1.0 04/26/2011   INR 1.21 10/23/2010    RADIOLOGY: Dg Ribs Unilateral W/chest Left  08/18/2013   CLINICAL DATA:  History of fall with injury to the left posterior  ribs.  EXAM: LEFT RIBS AND CHEST - 3+ VIEW  COMPARISON:  Chest x-ray 12/26/2012.  FINDINGS: Lung volumes are low. Linear bibasilar opacities favored to predominantly reflect subsegmental atelectasis. No pleural effusions. No definite pneumothorax. No evidence of pulmonary edema. Heart size is normal. Upper mediastinal contours are slightly distorted by patient's rotation to the left. Atherosclerosis in the thoracic aorta.  Dedicated views of the left ribs demonstrate numerous acute left-sided rib fractures involving the posterior aspects of the left fourth, fifth, sixth and ninth ribs, as well as the lateral aspects of the fourth, fifth, sixth and seventh ribs. In addition, there are old healed fractures of the posterolateral aspects of the left eighth and ninth ribs.  IMPRESSION: 1. Multiple nondisplaced left-sided rib fractures, several of which are segmental (fourth, fifth and sixth), as detailed above. No definite left-sided pneumothorax at this time. 2. Low lung volumes with bibasilar subsegmental atelectasis. 3. Atherosclerosis.   Electronically Signed   By: Vinnie Langton M.D.   On: 08/18/2013 06:00   Dg Lumbar Spine 2-3  Views  08/18/2013   CLINICAL DATA:  Low back and left leg pain.  EXAM: LUMBAR SPINE - 2-3 VIEW  COMPARISON:  Abdominal CT 05/18/2011.  FINDINGS: There are 5 lumbar type vertebral bodies. The alignment is normal. There is no evidence of acute fracture or focal disc space loss. Paraspinal osteophytes are stable.  IMPRESSION: No evidence of acute lumbar spine injury.   Electronically Signed   By: Camie Patience M.D.   On: 08/18/2013 10:23   Dg Pelvis 1-2 Views  08/18/2013   CLINICAL DATA:  History of trauma from a fall. Left posterior rib pain.  EXAM: PELVIS - 1-2 VIEW  COMPARISON:  No priors.  FINDINGS: Single AP view of the pelvis demonstrates no acute displaced fractures of the bony pelvic ring. Bilateral proximal femurs as visualized appear intact, and the femoral heads project over the acetabula bilaterally. 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  IMPRESSION: 1. No acute radiographic abnormality of the bony pelvis.   Electronically Signed   By: Vinnie Langton M.D.   On: 08/18/2013 06:01   Ct Head Wo Contrast  08/18/2013   CLINICAL DATA:  Fall, head trauma.  EXAM: CT HEAD WITHOUT CONTRAST  TECHNIQUE: Contiguous axial images were obtained from the base of the skull through the vertex without intravenous contrast.  COMPARISON:  CT HEAD W/O CM dated 07/12/2010  FINDINGS: The ventricles and sulci are normal for age. No intraparenchymal hemorrhage, mass effect nor midline shift. Patchy supratentorial white matter hypodensities are less than expected for patient's age and though non-specific suggest sequelae of chronic small vessel ischemic disease. No acute large vascular territory infarcts.  No abnormal extra-axial fluid collections. Basal cisterns are patent. Moderate calcific atherosclerosis of the carotid siphons.  Small left suboccipital scalp hematoma with bubbles of subcutaneous gas most consistent laceration, no radiopaque foreign bodies. No skull fracture.  Similar focal fat within the clivus, nonspecific. The included ocular globes and orbital contents are non-suspicious. Status post bilateral ocular lens implant. Worsening hand paranasal sinusitis with near complete opacification of the maxillary sinuses partially imaged. Density within the right maxillary sinus without destructive bony lesions in the included view.  IMPRESSION: Small left suboccipital scalp hematoma with suspected laceration, no radiopaque foreign bodies or skull fracture. No acute intracranial process.  Worsening paranasal sinusitis, dense right maxillary component could reflect inspissated mucus or possible chronic fungal sinusitis.  Involutional changes. Mild white matter changes  suggest chronic small vessel ischemic disease.   Electronically Signed   By: Elon Alas   On: 08/18/2013 06:28   Ct Chest Wo Contrast  08/18/2013   CLINICAL DATA:  Chest pain status post fall. Rib fractures on radiographs.  EXAM: CT CHEST WITHOUT CONTRAST  TECHNIQUE: Multidetector CT imaging of the chest was performed following the standard protocol without IV contrast.  COMPARISON:  Rib radiographs obtained today.  Chest CT 10/20/2010.  FINDINGS: The heart size is stable. There is no evidence of mediastinal hematoma. Atherosclerosis of the aorta, great vessels and coronary arteries is noted. There is no significant pericardial effusion or mediastinal lymphadenopathy.  As noted on earlier radiographs, there are multiple mildly displaced acute posterior left rib fractures. There is associated soft tissue emphysema within the posterior left chest wall. There is also small amount of air within the anterior epicardial fat. There is no generalized pneumomediastinum or pneumothorax.  There is mild dependent atelectasis in both lungs. There is an 8 mm well-circumscribed nodule inferiorly in the right upper lobe (image 24). On the reformatted images, this lies superior to the minor fissure and appears slightly larger  than on the prior study. No other pulmonary nodules are identified. There is no endobronchial lesion.  There is no evidence acute sternal or spinal fracture. The visualized upper abdomen appears unremarkable.  IMPRESSION: 1. Multiple left-sided rib fractures with associated soft tissue emphysema and air within the pre cardiac fat. No generalized pneumomediastinum or pneumothorax. 2. Bibasilar atelectasis. 3. Atherosclerosis. 4. Well-circumscribed right upper lobe pulmonary nodule may be slightly larger than on the prior study from 3 years ago. However, this does not demonstrate any aggressive characteristics. Follow up chest CT in 1 year suggested to evaluate stability.   Electronically Signed   By: Camie Patience M.D.   On: 08/18/2013 10:33   Ct Cervical Spine Wo Contrast  08/18/2013   CLINICAL DATA:  Pain post trauma  EXAM: CT CERVICAL SPINE WITHOUT CONTRAST  TECHNIQUE: Multidetector CT imaging of the cervical spine was performed without intravenous contrast. Multiplanar CT image reconstructions were also generated.  COMPARISON:  None.  FINDINGS: There is no acute fracture or spondylolisthesis. There is evidence of old trauma with remodeling in the spinous processes at C7 and T1. Prevertebral soft tissues and predental space regions are normal. There is moderately severe disc space narrowing at C4-5. There is milder disc space narrowing at C3-4 and C5-6. There is facet hypertrophy with exit foraminal narrowing at C4-5 of and C5-6 bilaterally. No disc extrusion or stenosis.  There is no apical pneumothorax apparent. Note that there is soft tissue air on the left and posteriorly, likely from the known rib fractures seen earlier in the day.  IMPRESSION: No acute fracture or spondylolisthesis. Soft tissue air is probably due to rib fractures. No apical pneumothorax seen on this study. Evidence suggesting old trauma involving the C7 and T1 spinous processes. 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    Electronically Signed   By: Lowella Grip M.D.   On: 08/18/2013 10:29   Dg Chest Port 1 View  08/19/2013   CLINICAL DATA:  On rib fractures ; pain and shortness of breath.  EXAM: PORTABLE CHEST - 1 VIEW  COMPARISON:  CT scan of the chest dated 18 Aug 2013  FINDINGS: The lungs are mildly hypoinflated. The costophrenic angle on the right sharp laterally. On the left there is some blunting of the costophrenic angle and partial obscuration of the hemidiaphragm. There is no pneumothorax.  The cardiopericardial silhouette is normal in size. The pulmonary vascularity is not engorged. Are fractures through the lateral aspect of the left fourth, fifth, and sixth ribs. The fifth rib appears to be fractured more laterally as well. The mediastinum is normal in width.  IMPRESSION: There is no evidence of a pneumothorax. The previously described pneumomediastinum is not well demonstrated on the current exam. There is a small left pleural effusion consistent with a hemothorax given the acute rib fractures.   Electronically Signed   By: David  Martinique   On: 08/19/2013 06:24      ASSESSMENT: Kathyrn Lass:    1) history of ischemic cardiomyopathy: Ejection fraction was 20-25% the past. With medical therapy, it is now back to 50%. He had an MRI several years ago showing a lack of viability which led to no attempt at revascularization of his coronary disease. Scar was noted. Given his recent syncope, will consult EP to see whether EP study to look for inducible VT is reasonable.  2) non-STEMI/CAD: His troponin have increased. He does not describe angina.  I am hesitant to anticoagulate him due to concern of hemothorax in the setting of rib fractures, noted on chest x-ray this morning.  Known occluded LAD and circumflex from prior catheterization.  Continue to monitor on telemetry.  Jettie Booze, MD  08/19/2013  9:42 AM

## 2013-08-19 NOTE — Progress Notes (Signed)
Utilization Review Completed.Gerald Lusignan T Dowell5/19/2015  

## 2013-08-19 NOTE — Progress Notes (Signed)
Subjective:   Pt has no new complaints this AM.  Pt is feeling better and did not have any events overnight. VSS.  He was able to tolerate some clear broth this morning.  He is still having significant pain with deep breathing.  Denies CP, SOB, palpitations.    Objective:   Vital signs in last 24 hours: Filed Vitals:   08/19/13 0800 08/19/13 0900 08/19/13 1007 08/19/13 1200  BP:  117/64 143/61 118/65  Pulse:  73  66  Temp: 98.3 F (36.8 C)     TempSrc: Oral   Oral  Resp:  12  14  Height:      Weight:      SpO2:  88%  99%    Weight: Filed Weights   08/18/13 1600 08/19/13 0400  Weight: 178 lb 2.1 oz (80.8 kg) 185 lb 3 oz (84 kg)    Ins/Outs:  Intake/Output Summary (Last 24 hours) at 08/19/13 1202 Last data filed at 08/19/13 0600  Gross per 24 hour  Intake    908 ml  Output    800 ml  Net    108 ml    Physical Exam: Constitutional: Vital signs reviewed.  Patient is sitting up in bed in no acute distress and cooperative with exam.   HEENT: [place]/AT; PERRL, EOMI, conjunctivae normal/pale, no scleral icterus  Cardiovascular: RRR, no MRG Pulmonary/Chest: normal respiratory effort, no accessory muscle use, CTAB, no wheezes, rales, or rhonchi Abdominal: Soft. +BS, NT/ND Neurological: A&O x3, CN II_XII grossly intact; non-focal exam Musculoskeletal: 15cm laceration R upper back at approximately the T3 level.   Abrasions noted left mid thoracic and back region with TTP. No midline spinal tenderness noted.  Extremities: 2+DP b/l, no C/C/E  Skin: Warm, dry.    Lab Results:  BMP:  Recent Labs Lab 08/18/13 0643 08/18/13 0658 08/18/13 1345 08/19/13 0059  NA 138 139  --  136*  K 4.9 4.8  --  4.5  CL 100 105  --  102  CO2 22  --   --  22  GLUCOSE 184* 178*  --  88  BUN 22 21  --  19  CREATININE 1.48* 1.60*  1.70*  --  1.18  CALCIUM 9.3  --   --  8.3*  MG  --   --  2.0  --    Anion Gap:  12  CBC:  Recent Labs Lab 08/18/13 0643 08/18/13 0658 08/19/13 0059    WBC 12.9*  --  9.2  NEUTROABS 11.3*  --   --   HGB 11.9* 13.6 11.0*  HCT 36.8* 40.0 34.4*  MCV 86.2  --  88.9  PLT 279  --  232    Coagulation:  Recent Labs Lab 08/18/13 1345  LABPROT 14.1  INR 1.11    CBG:            Recent Labs Lab 08/18/13 0651 08/18/13 1641 08/18/13 2132 08/19/13 0037 08/19/13 0746  GLUCAP 173* 161* 112* 83 140*           HA1C:       Recent Labs Lab 08/18/13 1345  HGBA1C 7.4*    Lipid Panel: No results found for this basename: CHOL, HDL, LDLCALC, TRIG, CHOLHDL, LDLDIRECT,  in the last 168 hours  LFTs:  Recent Labs Lab 08/18/13 1345  AST 32  ALT 17  ALKPHOS 50  BILITOT 0.8  PROT 7.1  ALBUMIN 3.5    Pancreatic Enzymes: No results found for this basename: LIPASE,  AMYLASE,  in the last 168 hours  Lactic Acid/Procalcitonin:  Recent Labs Lab 08/18/13 0643 08/18/13 1345  LATICACIDVEN 2.4* 2.0    Ammonia: No results found for this basename: AMMONIA,  in the last 168 hours  Cardiac Enzymes:  Recent Labs Lab 08/18/13 1345 08/18/13 1823 08/19/13 0059  CKTOTAL 537*  --   --   CKMB 7.0*  --   --   TROPONINI 0.32* 0.94* 1.72*    EKG: EKG Interpretation  Date/Time:    Ventricular Rate:    PR Interval:    QRS Duration:   QT Interval:    QTC Calculation:   R Axis:     Text Interpretation:     BNP: No results found for this basename: PROBNP,  in the last 168 hours  D-Dimer: No results found for this basename: DDIMER,  in the last 168 hours  Urinalysis:  Recent Labs Lab 08/18/13 1200  COLORURINE YELLOW  LABSPEC 1.014  PHURINE 5.0  GLUCOSEU NEGATIVE  HGBUR NEGATIVE  BILIRUBINUR NEGATIVE  KETONESUR 15*  PROTEINUR NEGATIVE  UROBILINOGEN 0.2  NITRITE NEGATIVE  LEUKOCYTESUR TRACE*    Micro Results: Recent Results (from the past 240 hour(s))  MRSA PCR SCREENING     Status: None   Collection Time    08/18/13  4:00 PM      Result Value Ref Range Status   MRSA by PCR NEGATIVE  NEGATIVE Final    Comment:            The GeneXpert MRSA Assay (FDA     approved for NASAL specimens     only), is one component of a     comprehensive MRSA colonization     surveillance program. It is not     intended to diagnose MRSA     infection nor to guide or     monitor treatment for     MRSA infections.    Blood Culture: No results found for this basename: sdes, specrequest, cult, reptstatus    Studies/Results: Dg Ribs Unilateral W/chest Left  08/18/2013   CLINICAL DATA:  History of fall with injury to the left posterior ribs.  EXAM: LEFT RIBS AND CHEST - 3+ VIEW  COMPARISON:  Chest x-ray 12/26/2012.  FINDINGS: Lung volumes are low. Linear bibasilar opacities favored to predominantly reflect subsegmental atelectasis. No pleural effusions. No definite pneumothorax. No evidence of pulmonary edema. Heart size is normal. Upper mediastinal contours are slightly distorted by patient's rotation to the left. Atherosclerosis in the thoracic aorta.  Dedicated views of the left ribs demonstrate numerous acute left-sided rib fractures involving the posterior aspects of the left fourth, fifth, sixth and ninth ribs, as well as the lateral aspects of the fourth, fifth, sixth and seventh ribs. In addition, there are old healed fractures of the posterolateral aspects of the left eighth and ninth ribs.  IMPRESSION: 1. Multiple nondisplaced left-sided rib fractures, several of which are segmental (fourth, fifth and sixth), as detailed above. No definite left-sided pneumothorax at this time. 2. Low lung volumes with bibasilar subsegmental atelectasis. 3. Atherosclerosis.   Electronically Signed   By: Vinnie Langton M.D.   On: 08/18/2013 06:00   Dg Lumbar Spine 2-3 Views  08/18/2013   CLINICAL DATA:  Low back and left leg pain.  EXAM: LUMBAR SPINE - 2-3 VIEW  COMPARISON:  Abdominal CT 05/18/2011.  FINDINGS: There are 5 lumbar type vertebral bodies. The alignment is normal. There is no evidence of acute fracture or focal  disc space loss.  Paraspinal osteophytes are stable.  IMPRESSION: No evidence of acute lumbar spine injury.   Electronically Signed   By: Camie Patience M.D.   On: 08/18/2013 10:23   Dg Pelvis 1-2 Views  08/18/2013   CLINICAL DATA:  History of trauma from a fall. Left posterior rib pain.  EXAM: PELVIS - 1-2 VIEW  COMPARISON:  No priors.  FINDINGS: Single AP view of the pelvis demonstrates no acute displaced fractures of the bony pelvic ring. Bilateral proximal femurs as visualized appear intact, and the femoral heads project over the acetabula bilaterally. 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  IMPRESSION: 1. No acute radiographic abnormality of the bony pelvis.   Electronically Signed   By: Vinnie Langton M.D.   On: 08/18/2013 06:01   Ct Head Wo Contrast  08/18/2013   CLINICAL DATA:  Fall, head trauma.  EXAM: CT HEAD WITHOUT CONTRAST  TECHNIQUE: Contiguous axial images were obtained from the base of the skull through the vertex without intravenous contrast.  COMPARISON:  CT HEAD W/O CM dated 07/12/2010  FINDINGS: The ventricles and sulci are normal for age. No intraparenchymal hemorrhage, mass effect nor midline shift. Patchy supratentorial white matter hypodensities are less than expected for patient's age and though non-specific suggest sequelae of chronic small vessel ischemic disease. No acute large vascular territory infarcts.  No abnormal extra-axial fluid collections. Basal cisterns are patent. Moderate calcific atherosclerosis of the carotid siphons.  Small left suboccipital scalp hematoma with bubbles of subcutaneous gas most consistent laceration, no radiopaque foreign bodies. No skull fracture. Similar focal fat within the clivus, nonspecific. The included ocular globes and orbital contents are non-suspicious. Status post bilateral ocular lens implant. Worsening hand paranasal sinusitis with near complete opacification of the maxillary sinuses partially  imaged. Density within the right maxillary sinus without destructive bony lesions in the included view.  IMPRESSION: Small left suboccipital scalp hematoma with suspected laceration, no radiopaque foreign bodies or skull fracture. No acute intracranial process.  Worsening paranasal sinusitis, dense right maxillary component could reflect inspissated mucus or possible chronic fungal sinusitis.  Involutional changes. Mild white matter changes suggest chronic small vessel ischemic disease.   Electronically Signed   By: Elon Alas   On: 08/18/2013 06:28   Ct Chest Wo Contrast  08/18/2013   CLINICAL DATA:  Chest pain status post fall. Rib fractures on radiographs.  EXAM: CT CHEST WITHOUT CONTRAST  TECHNIQUE: Multidetector CT imaging of the chest was performed following the standard protocol without IV contrast.  COMPARISON:  Rib radiographs obtained today.  Chest CT 10/20/2010.  FINDINGS: The heart size is stable. There is no evidence of mediastinal hematoma. Atherosclerosis of the aorta, great vessels and coronary arteries is noted. There is no significant pericardial effusion or mediastinal lymphadenopathy.  As noted on earlier radiographs, there are multiple mildly displaced acute posterior left rib fractures. There is associated soft tissue emphysema within the posterior left chest wall. There is also small amount of air within the anterior epicardial fat. There is no generalized pneumomediastinum or pneumothorax.  There is mild dependent atelectasis in both lungs. There is an 8 mm well-circumscribed nodule inferiorly in the right upper lobe (image 24). On the reformatted images, this lies superior to the minor fissure and appears slightly larger than on the prior study. No other pulmonary nodules are identified. There is no endobronchial lesion.  There is no evidence acute sternal or spinal fracture. The visualized upper abdomen appears unremarkable.  IMPRESSION: 1. Multiple  left-sided rib fractures with  associated soft tissue emphysema and air within the pre cardiac fat. No generalized pneumomediastinum or pneumothorax. 2. Bibasilar atelectasis. 3. Atherosclerosis. 4. Well-circumscribed right upper lobe pulmonary nodule may be slightly larger than on the prior study from 3 years ago. However, this does not demonstrate any aggressive characteristics. Follow up chest CT in 1 year suggested to evaluate stability.   Electronically Signed   By: Camie Patience M.D.   On: 08/18/2013 10:33   Ct Cervical Spine Wo Contrast  08/18/2013   CLINICAL DATA:  Pain post trauma  EXAM: CT CERVICAL SPINE WITHOUT CONTRAST  TECHNIQUE: Multidetector CT imaging of the cervical spine was performed without intravenous contrast. Multiplanar CT image reconstructions were also generated.  COMPARISON:  None.  FINDINGS: There is no acute fracture or spondylolisthesis. There is evidence of old trauma with remodeling in the spinous processes at C7 and T1. Prevertebral soft tissues and predental space regions are normal. There is moderately severe disc space narrowing at C4-5. There is milder disc space narrowing at C3-4 and C5-6. There is facet hypertrophy with exit foraminal narrowing at C4-5 of and C5-6 bilaterally. No disc extrusion or stenosis.  There is no apical pneumothorax apparent. Note that there is soft tissue air on the left and posteriorly, likely from the known rib fractures seen earlier in the day.  IMPRESSION: No acute fracture or spondylolisthesis. Soft tissue air is probably due to rib fractures. No apical pneumothorax seen on this study. Evidence suggesting old trauma involving the C7 and T1 spinous processes. 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   Electronically Signed   By: Lowella Grip M.D.   On: 08/18/2013 10:29   Dg Chest Port 1 View  08/19/2013   CLINICAL DATA:  On rib fractures ; pain and shortness of breath.  EXAM: PORTABLE CHEST - 1 VIEW  COMPARISON:  CT scan of the chest dated 18 Aug 2013   FINDINGS: The lungs are mildly hypoinflated. The costophrenic angle on the right sharp laterally. On the left there is some blunting of the costophrenic angle and partial obscuration of the hemidiaphragm. There is no pneumothorax. The cardiopericardial silhouette is normal in size. The pulmonary vascularity is not engorged. Are fractures through the lateral aspect of the left fourth, fifth, and sixth ribs. The fifth rib appears to be fractured more laterally as well. The mediastinum is normal in width.  IMPRESSION: There is no evidence of a pneumothorax. The previously described pneumomediastinum is not well demonstrated on the current exam. There is a small left pleural effusion consistent with a hemothorax given the acute rib fractures.   Electronically Signed   By: David  Martinique   On: 08/19/2013 06:24    Medications:  Scheduled Meds: . aspirin EC  81 mg Oral Daily  . bacitracin   Topical BID  . insulin aspart  0-9 Units Subcutaneous TID WC  . insulin glargine  10 Units Subcutaneous QHS  . methocarbamol  1,500 mg Oral QID  . metoprolol tartrate  37.5 mg Oral BID  . mometasone-formoterol  2 puff Inhalation BID  . pantoprazole  40 mg Oral Daily  . simvastatin  20 mg Oral q1800  . sodium chloride  3 mL Intravenous Q12H  . Tdap  0.5 mL Intramuscular Once  . traMADol  100 mg Oral 4 times per day   Continuous Infusions:  PRN Meds: ondansetron (ZOFRAN) IV, oxyCODONE  Antibiotics: Antibiotics Given (last 72 hours)   None      Day  of Hospitalization: 1  Consults: Treatment Team:  Trauma Md, MD Rounding Lbcardiology, MD  Assessment/Plan:   Principal Problem:   Syncope Active Problems:   HYPERTENSION   Coronary artery disease   Ischemic cardiomyopathy   Diabetes mellitus   Chronic systolic heart failure   Multiple fractures of ribs of left side   Laceration of skin of scalp   Hematoma of occipital surface of head   Chronic sinusitis   Laceration of skin   NSTEMI (non-ST  elevated myocardial infarction)  Syncope  Hx of CAD/ICM.  Pt presents with 1 episode of syncope but has experienced episodes in the past.  We were unable to get orthostatics but etiology likely cardiogenic vs. vasovagal.  Pt states he never had a loop recorder to determine if there is any arrythmia that may account for his syncope.  Trops were trending up yesterday 0.32-->0.94-->1.72-->.  Today trop is 0.79 so trending back down.  -would keep in SDU 1 more day -continue to trend trops x2 -appreciate EP recs  Multiple segmental rib fractures at risk for flail chest CXR showed multiple nondisplaced left-sided rib segmental fractures.  CT Chest showed multiple left-sided rib fractures with associated soft tissue emphysema and air within the pre cardiac fat.  No generalized pneumomediastinum or pneumothorax. Appreciate trauma service consult. -pain management--Robaxin, OxyIR and Tramadol -pulmonary toilet/Incentive spirometry -close monitor for s/s of flail chest--need to consult Trauma and PCCM if indicated.   Upper back skin laceration 15 cm laceration at risk for skin necrosis.  Trauma consulted at ED and laceration repaired by trama service. -monitor  Left suboccipital scalp hematoma and laceration No active bleeding noted -monitor  AKI Resolved.  Baseline is 1.1-1.2.  He was noted to have mild elevated Cr at 1.7 on admission.   -hold lasix -avoid nephrotoxic meds -BMP in am  Leukocytosis Likely stress response. - follow up on CBC  HYPERTENSION On home lasix, Imdur and Metoprolol.  - will hold lasix and imdur given AKi and syncope workup. - continue metoprolol  DMII -Hold home Glipizide -Lantus 10 units QHS -SSI   Chronic sinusitis This is an incidental finding on CT head.   -follow-up as outpatient    VTE SCD  Dispo: Disposition is deferred at this time, awaiting improvement of current medical problems. Anticipated discharge in approximately 2-3 day(s).   The  patient does have a current PCP Charolotte Eke Powers) and does not need an [facility] hospital follow-up appointment after discharge.   Signed: Jones Bales, MD 08/19/2013, 12:01 PM

## 2013-08-19 NOTE — Evaluation (Signed)
Occupational Therapy Evaluation Patient Details Name: Gerald Rosario MRN: [id number] DOB: [birth year] Today's Date: 08/19/2013    History of Present Illness Pt is a 68 y/o male admitted s/p syncope and fall at home, in which he sustained L sided rib fractures and a laceration to his upper thoracic area.   Clinical Impression   PT admitted with syncope and Nstemi. Pt currently with functional limitiations due to the deficits listed below (see OT problem list).  Pt will benefit from skilled OT to increase their independence and safety with adls and balance to allow discharge [place]. Ot to continue to follow acutely. Session limited due to sudden onset of vomiting.     Follow Up Recommendations  Home health OT;Supervision - Intermittent (no driving at this time due to cognition)    Equipment Recommendations  Other (comment) (tba)    Recommendations for Other Services       Precautions / Restrictions Precautions Precautions: Fall Precaution Comments: L rib fractures, mid-line upper thoracic laceration Restrictions Weight Bearing Restrictions: No      Mobility Bed Mobility Overal bed mobility: Needs Assistance Bed Mobility: Supine to Sit;Sit to Supine     Supine to sit: Min guard;HOB elevated Sit to supine: Min guard;HOB elevated   General bed mobility comments: extended time and great effort. pt unable to reposition supine. pt requires HOB elevated and bil Ue use on bed rail  Transfers Overall transfer level: Needs assistance   Transfers: Sit to/from Stand Sit to Stand: Min guard         General transfer comment: guarded ambulation with very small steps. Decr gait velocity and gait length.    Balance Overall balance assessment: Needs assistance Sitting-balance support: No upper extremity supported;Feet supported Sitting balance-Leahy Scale: Good                         High Level Balance Comments: Pt wtih LOB posteriorly exiting  bathroom with self correction            ADL Overall ADL's : Needs assistance/impaired     Grooming: Wash/dry face;Set up;Sitting               Lower Body Dressing: Moderate assistance;Sit to/from stand   Toilet Transfer: Minimal assistance   Toileting- Clothing Manipulation and Hygiene: Moderate assistance;Sit to/from stand       Functional mobility during ADLs: Min guard General ADL Comments: Pt requesting to ambulate. pt terminating ambulation to return to bedside to drink water. pt verbalized need to void bladder. Pt provided urinal and allowed to static stand in rest room . pt unable to void but expressed need to void. Pt exiting bathroom and redirected to eob. Pt nauseated and vomiting. Pt with VSS during session.  BP was not obtained during session but pt denies any CP or dizziness. Pt reports pain only at Lt ribs currently     Vision                     Perception     Praxis      Pertinent Vitals/Pain RA 89% HR 70s entire session     Hand Dominance Right   Extremity/Trunk Assessment Upper Extremity Assessment Upper Extremity Assessment: Generalized weakness;Difficult to assess due to impaired cognition (assessed AROM bil UE 80 degrees. limited Lt UE due to rib fx)   Lower Extremity Assessment Lower Extremity Assessment: Defer to PT evaluation   Cervical / Trunk Assessment Cervical / Trunk Assessment:  Normal   Communication Communication Communication: No difficulties   Cognition Arousal/Alertness: Awake/alert Behavior During Therapy: WFL for tasks assessed/performed Overall Cognitive Status: Impaired/Different from baseline Area of Impairment: Attention;Memory;Awareness   Current Attention Level: Sustained Memory: Decreased short-term memory     Awareness: Anticipatory   General Comments: pt repeating same questions. pt claims to recognize therapist asking multiple times "you were here yesterday" even after education that this is our  first meeting. Pt needs redirection to task. pt poor recall of instructions.    General Comments       Exercises       Shoulder Instructions      Home Living Family/patient expects to be discharged to:: Private residence Living Arrangements: Alone Available Help at Discharge: Available 24 hours/day;Family;Friend(s) Type of Home: Apartment Home Access: Stairs to enter Entrance Stairs-Number of Steps: 3 Entrance Stairs-Rails: Right Home Layout: 1/2 bath on main level;Bed/bath upstairs     Bathroom Shower/Tub: Tub/shower unit;Door   [employer] Toilet: Standard     Home Equipment: None          Prior Functioning/Environment Level of Independence: Independent        Comments: Retired, still driving    OT Diagnosis: Generalized weakness;Cognitive deficits;Acute pain   OT Problem List: Decreased strength;Decreased activity tolerance;Impaired balance (sitting and/or standing);Decreased cognition;Decreased safety awareness;Decreased knowledge of use of DME or AE;Decreased knowledge of precautions;Obesity;Pain   OT Treatment/Interventions: Self-care/ADL training;Therapeutic exercise;DME and/or AE instruction;Therapeutic activities;Patient/family education;Balance training;Cognitive remediation/compensation    OT Goals(Current goals can be found in the care plan section) Acute Rehab OT Goals Patient Stated Goal: to get out of hospital OT Goal Formulation: With patient Time For Goal Achievement: 09/02/13 Potential to Achieve Goals: Good  OT Frequency: Min 2X/week   Barriers to D/C: Decreased caregiver support          Co-evaluation              End of Session Nurse Communication: Mobility status;Precautions  Activity Tolerance: Other (comment) (vomiting) Patient left: in bed;with bed alarm set;with call bell/phone within reach   Time: 1453-1518 OT Time Calculation (min): 25 min Charges:  OT General Charges $OT Visit: 1 Procedure OT Evaluation $Initial OT  Evaluation Tier I: 1 Procedure OT Treatments $Self Care/Home Management : 8-22 mins G-Codes:    Peri Maris 04-Sep-2013, 3:54 PM Pager: [phone] RN called to room for vomiting MD entering room at end of session

## 2013-08-19 NOTE — Progress Notes (Signed)
Subjective: Patient was feeling ok, pain on any movement due to rib fractures.Was aware that he might have a silent MI. Wants to tell his kids now and said his daughter might want to talk to physician.Tolerating clear liquid diet well. Objective: Vital signs in last 24 hours: Filed Vitals:   08/19/13 0800 08/19/13 0900 08/19/13 1007 08/19/13 1200  BP:  117/64 143/61 118/65  Pulse:  73  66  Temp: 98.3 F (36.8 C)   98 F (36.7 C)  TempSrc: Oral   Oral  Resp:  12  14  Height:      Weight:      SpO2:  88%  99%   Weight change:   Intake/Output Summary (Last 24 hours) at 08/19/13 1247 Last data filed at 08/19/13 0600  Gross per 24 hour  Intake    908 ml  Output      0 ml  Net    908 ml   EXAM: GENERAL: alert, oriented and cooperative not in acute distress. HEENT: PERRL, EOMI, no sinus tenderness or discharge NECK: supple, no adenopathy. No JVD . No carotid bruit LUNG: Decreased air entry on left, no added sound.  HEART: RRR, S1 and S2 , no MRG CHEST WALL: no bruise or tenderness. ABD: soft, non distended, non tender, no organomegaly. EXTREMITIES: NO edema, few small bruises and laceration from the fall on his arms. NEURO: A&O X3 , CN 2-12 grossly intact, no apparent neurological deficit. Lab Results: @LABTEST2 @ Micro Results: Recent Results (from the past 240 hour(s))  MRSA PCR SCREENING     Status: None   Collection Time    08/18/13  4:00 PM      Result Value Ref Range Status   MRSA by PCR NEGATIVE  NEGATIVE Final   Comment:            The GeneXpert MRSA Assay (FDA     approved for NASAL specimens     only), is one component of a     comprehensive MRSA colonization     surveillance program. It is not     intended to diagnose MRSA     infection nor to guide or     monitor treatment for     MRSA infections.   Studies/Results: Dg Ribs Unilateral W/chest Left  08/18/2013   CLINICAL DATA:  History of fall with injury to the left posterior ribs.  EXAM: LEFT RIBS AND  CHEST - 3+ VIEW  COMPARISON:  Chest x-ray 12/26/2012.  FINDINGS: Lung volumes are low. Linear bibasilar opacities favored to predominantly reflect subsegmental atelectasis. No pleural effusions. No definite pneumothorax. No evidence of pulmonary edema. Heart size is normal. Upper mediastinal contours are slightly distorted by patient's rotation to the left. Atherosclerosis in the thoracic aorta.  Dedicated views of the left ribs demonstrate numerous acute left-sided rib fractures involving the posterior aspects of the left fourth, fifth, sixth and ninth ribs, as well as the lateral aspects of the fourth, fifth, sixth and seventh ribs. In addition, there are old healed fractures of the posterolateral aspects of the left eighth and ninth ribs.  IMPRESSION: 1. Multiple nondisplaced left-sided rib fractures, several of which are segmental (fourth, fifth and sixth), as detailed above. No definite left-sided pneumothorax at this time. 2. Low lung volumes with bibasilar subsegmental atelectasis. 3. Atherosclerosis.   Electronically Signed   By: Vinnie Langton M.D.   On: 08/18/2013 06:00   Dg Lumbar Spine 2-3 Views  08/18/2013   CLINICAL DATA:  Low back  and left leg pain.  EXAM: LUMBAR SPINE - 2-3 VIEW  COMPARISON:  Abdominal CT 05/18/2011.  FINDINGS: There are 5 lumbar type vertebral bodies. The alignment is normal. There is no evidence of acute fracture or focal disc space loss. Paraspinal osteophytes are stable.  IMPRESSION: No evidence of acute lumbar spine injury.   Electronically Signed   By: Camie Patience M.D.   On: 08/18/2013 10:23   Dg Pelvis 1-2 Views  08/18/2013   CLINICAL DATA:  History of trauma from a fall. Left posterior rib pain.  EXAM: PELVIS - 1-2 VIEW  COMPARISON:  No priors.  FINDINGS: Single AP view of the pelvis demonstrates no acute displaced fractures of the bony pelvic ring. Bilateral proximal femurs as visualized appear intact, and the femoral heads project over the acetabula bilaterally.  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  IMPRESSION: 1. No acute radiographic abnormality of the bony pelvis.   Electronically Signed   By: Vinnie Langton M.D.   On: 08/18/2013 06:01   Ct Head Wo Contrast  08/18/2013   CLINICAL DATA:  Fall, head trauma.  EXAM: CT HEAD WITHOUT CONTRAST  TECHNIQUE: Contiguous axial images were obtained from the base of the skull through the vertex without intravenous contrast.  COMPARISON:  CT HEAD W/O CM dated 07/12/2010  FINDINGS: The ventricles and sulci are normal for age. No intraparenchymal hemorrhage, mass effect nor midline shift. Patchy supratentorial white matter hypodensities are less than expected for patient's age and though non-specific suggest sequelae of chronic small vessel ischemic disease. No acute large vascular territory infarcts.  No abnormal extra-axial fluid collections. Basal cisterns are patent. Moderate calcific atherosclerosis of the carotid siphons.  Small left suboccipital scalp hematoma with bubbles of subcutaneous gas most consistent laceration, no radiopaque foreign bodies. No skull fracture. Similar focal fat within the clivus, nonspecific. The included ocular globes and orbital contents are non-suspicious. Status post bilateral ocular lens implant. Worsening hand paranasal sinusitis with near complete opacification of the maxillary sinuses partially imaged. Density within the right maxillary sinus without destructive bony lesions in the included view.  IMPRESSION: Small left suboccipital scalp hematoma with suspected laceration, no radiopaque foreign bodies or skull fracture. No acute intracranial process.  Worsening paranasal sinusitis, dense right maxillary component could reflect inspissated mucus or possible chronic fungal sinusitis.  Involutional changes. Mild white matter changes suggest chronic small vessel ischemic disease.   Electronically Signed   By: Elon Alas   On: 08/18/2013 06:28     Ct Chest Wo Contrast  08/18/2013   CLINICAL DATA:  Chest pain status post fall. Rib fractures on radiographs.  EXAM: CT CHEST WITHOUT CONTRAST  TECHNIQUE: Multidetector CT imaging of the chest was performed following the standard protocol without IV contrast.  COMPARISON:  Rib radiographs obtained today.  Chest CT 10/20/2010.  FINDINGS: The heart size is stable. There is no evidence of mediastinal hematoma. Atherosclerosis of the aorta, great vessels and coronary arteries is noted. There is no significant pericardial effusion or mediastinal lymphadenopathy.  As noted on earlier radiographs, there are multiple mildly displaced acute posterior left rib fractures. There is associated soft tissue emphysema within the posterior left chest wall. There is also small amount of air within the anterior epicardial fat. There is no generalized pneumomediastinum or pneumothorax.  There is mild dependent atelectasis in both lungs. There is an 8 mm well-circumscribed nodule inferiorly in the right upper lobe (image 24). On the reformatted images, this lies superior  to the minor fissure and appears slightly larger than on the prior study. No other pulmonary nodules are identified. There is no endobronchial lesion.  There is no evidence acute sternal or spinal fracture. The visualized upper abdomen appears unremarkable.  IMPRESSION: 1. Multiple left-sided rib fractures with associated soft tissue emphysema and air within the pre cardiac fat. No generalized pneumomediastinum or pneumothorax. 2. Bibasilar atelectasis. 3. Atherosclerosis. 4. Well-circumscribed right upper lobe pulmonary nodule may be slightly larger than on the prior study from 3 years ago. However, this does not demonstrate any aggressive characteristics. Follow up chest CT in 1 year suggested to evaluate stability.   Electronically Signed   By: Camie Patience M.D.   On: 08/18/2013 10:33   Ct Cervical Spine Wo Contrast  08/18/2013   CLINICAL DATA:  Pain post  trauma  EXAM: CT CERVICAL SPINE WITHOUT CONTRAST  TECHNIQUE: Multidetector CT imaging of the cervical spine was performed without intravenous contrast. Multiplanar CT image reconstructions were also generated.  COMPARISON:  None.  FINDINGS: There is no acute fracture or spondylolisthesis. There is evidence of old trauma with remodeling in the spinous processes at C7 and T1. Prevertebral soft tissues and predental space regions are normal. There is moderately severe disc space narrowing at C4-5. There is milder disc space narrowing at C3-4 and C5-6. There is facet hypertrophy with exit foraminal narrowing at C4-5 of and C5-6 bilaterally. No disc extrusion or stenosis.  There is no apical pneumothorax apparent. Note that there is soft tissue air on the left and posteriorly, likely from the known rib fractures seen earlier in the day.  IMPRESSION: No acute fracture or spondylolisthesis. Soft tissue air is probably due to rib fractures. No apical pneumothorax seen on this study. Evidence suggesting old trauma involving the C7 and T1 spinous processes. 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   Electronically Signed   By: Lowella Grip M.D.   On: 08/18/2013 10:29   Dg Chest Port 1 View  08/19/2013   CLINICAL DATA:  On rib fractures ; pain and shortness of breath.  EXAM: PORTABLE CHEST - 1 VIEW  COMPARISON:  CT scan of the chest dated 18 Aug 2013  FINDINGS: The lungs are mildly hypoinflated. The costophrenic angle on the right sharp laterally. On the left there is some blunting of the costophrenic angle and partial obscuration of the hemidiaphragm. There is no pneumothorax. The cardiopericardial silhouette is normal in size. The pulmonary vascularity is not engorged. Are fractures through the lateral aspect of the left fourth, fifth, and sixth ribs. The fifth rib appears to be fractured more laterally as well. The mediastinum is normal in width.  IMPRESSION: There is no evidence of a pneumothorax. The  previously described pneumomediastinum is not well demonstrated on the current exam. There is a small left pleural effusion consistent with a hemothorax given the acute rib fractures.   Electronically Signed   By: David  Martinique   On: 08/19/2013 06:24   Medications: medication reviewed Scheduled Meds: . aspirin EC  81 mg Oral Daily  . bacitracin   Topical BID  . insulin aspart  0-9 Units Subcutaneous TID WC  . insulin glargine  10 Units Subcutaneous QHS  . methocarbamol  1,500 mg Oral QID  . metoprolol tartrate  37.5 mg Oral BID  . mometasone-formoterol  2 puff Inhalation BID  . pantoprazole  40 mg Oral Daily  . simvastatin  20 mg Oral q1800  . sodium chloride  3 mL Intravenous Q12H  .  Tdap  0.5 mL Intramuscular Once  . 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 NSTEMI: He has upward trend of his troponin with no change in EKG. Although his most recent troponin shows a downward trend, keep monitor for more levels. Cardiology has seen and advice to get an ECHO and EP studies . Continue monitor him and also his home meds.of IMDUR 30mg  QD.    RIB FRACTURE: Still having pain with any movement, his recent CXR shows some hemothorax but no pneumothorax. Continue monitoring his O2 sat. For any possible hypoxia. Continue with robaxin 1500 Q6H, oxycodeine Q4H PRN and tramadol 100mg  Q6H for pain. BACK LACERATION: Continue with wound care and monitor for any sign of infection. SCALP LACERATION: Healing today, keep monitor for any sign of infection. HTN: His BP seems under control, continue with his home meds. Of Lopressor 25mg  BID. SYSTOLIC  HEART FAILURE: Although asymptomatic , watch for any fluid overload and careful  about giving him excessive fluids.continue with home lasix 40 mg BID. DM: His blood CBG is little higher with A1C of 7.4. Novolog and lantus 10 unit was started yesyerday, monitor his response. He was on Glucotrol 10 mg QD at home. 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     This is a Careers information officer Note.  The care of the patient was discussed with Dr. Gordy Levan and the assessment and plan formulated with their assistance.  Please see their attached note for official documentation of the daily encounter.   LOS: 1 day   Lorella Nimrod, Med Student 08/19/2013, 12:47 PM

## 2013-08-19 NOTE — Progress Notes (Signed)
Patient ID: Gerald Rosario, male   DOB: February 27, 1946, 68 y.o.   MRN: [id number]    Subjective: Sore, especially with movement  Objective: Vital signs in last 24 hours: Temp:  [97.9 F (36.6 C)-99.1 F (37.3 C)] 99 F (37.2 C) (05/19 0400) Pulse Rate:  [72-96] 72 (05/19 0400) Resp:  [11-25] 12 (05/19 0400) BP: (84-162)/(55-89) 124/57 mmHg (05/19 0400) SpO2:  [92 %-100 %] 94 % (05/19 0400) Weight:  [178 lb 2.1 oz (80.8 kg)-185 lb 3 oz (84 kg)] 185 lb 3 oz (84 kg) (05/19 0400) Last BM Date: 08/17/13  Intake/Output from previous day: 05/18 0701 - 05/19 0700 In: 908 [P.O.:360; I.V.:548] Out: 800 [Urine:800] Intake/Output this shift:    General appearance: alert and cooperative Resp: some rales B Chest wall: left sided chest wall tenderness, mild L posterior chest wall deformity Cardio: regular rate and rhythm GI: soft, NT Back: laceration intact with sutures, mild drainage on telfa, no erythema  Lab Results: CBC   Recent Labs  08/18/13 0643 08/18/13 0658 08/19/13 0059  WBC 12.9*  --  9.2  HGB 11.9* 13.6 11.0*  HCT 36.8* 40.0 34.4*  PLT 279  --  232   BMET  Recent Labs  08/18/13 0643 08/18/13 0658 08/19/13 0059  NA 138 139 136*  K 4.9 4.8 4.5  CL 100 105 102  CO2 22  --  22  GLUCOSE 184* 178* 88  BUN 22 21 19   CREATININE 1.48* 1.60*  1.70* 1.18  CALCIUM 9.3  --  8.3*   PT/INR  Recent Labs  08/18/13 1345  LABPROT 14.1  INR 1.11   ABG  Recent Labs  08/18/13 1345  PHART 7.382  HCO3 20.5    Studies/Results: Dg Ribs Unilateral W/chest Left  08/18/2013   CLINICAL DATA:  History of fall with injury to the left posterior ribs.  EXAM: LEFT RIBS AND CHEST - 3+ VIEW  COMPARISON:  Chest x-ray 12/26/2012.  FINDINGS: Lung volumes are low. Linear bibasilar opacities favored to predominantly reflect subsegmental atelectasis. No pleural effusions. No definite pneumothorax. No evidence of pulmonary edema. Heart size is normal. Upper mediastinal contours are  slightly distorted by patient's rotation to the left. Atherosclerosis in the thoracic aorta.  Dedicated views of the left ribs demonstrate numerous acute left-sided rib fractures involving the posterior aspects of the left fourth, fifth, sixth and ninth ribs, as well as the lateral aspects of the fourth, fifth, sixth and seventh ribs. In addition, there are old healed fractures of the posterolateral aspects of the left eighth and ninth ribs.  IMPRESSION: 1. Multiple nondisplaced left-sided rib fractures, several of which are segmental (fourth, fifth and sixth), as detailed above. No definite left-sided pneumothorax at this time. 2. Low lung volumes with bibasilar subsegmental atelectasis. 3. Atherosclerosis.   Electronically Signed   By: Vinnie Langton M.D.   On: 08/18/2013 06:00   Dg Lumbar Spine 2-3 Views  08/18/2013   CLINICAL DATA:  Low back and left leg pain.  EXAM: LUMBAR SPINE - 2-3 VIEW  COMPARISON:  Abdominal CT 05/18/2011.  FINDINGS: There are 5 lumbar type vertebral bodies. The alignment is normal. There is no evidence of acute fracture or focal disc space loss. Paraspinal osteophytes are stable.  IMPRESSION: No evidence of acute lumbar spine injury.   Electronically Signed   By: Camie Patience M.D.   On: 08/18/2013 10:23   Dg Pelvis 1-2 Views  08/18/2013   CLINICAL DATA:  History of trauma from a fall. Left posterior  rib pain.  EXAM: PELVIS - 1-2 VIEW  COMPARISON:  No priors.  FINDINGS: Single AP view of the pelvis demonstrates no acute displaced fractures of the bony pelvic ring. Bilateral proximal femurs as visualized appear intact, and the femoral heads project over the acetabula bilaterally. 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  IMPRESSION: 1. No acute radiographic abnormality of the bony pelvis.   Electronically Signed   By: Vinnie Langton M.D.   On: 08/18/2013 06:01   Ct Head Wo Contrast  08/18/2013   CLINICAL DATA:  Fall, head trauma.   EXAM: CT HEAD WITHOUT CONTRAST  TECHNIQUE: Contiguous axial images were obtained from the base of the skull through the vertex without intravenous contrast.  COMPARISON:  CT HEAD W/O CM dated 07/12/2010  FINDINGS: The ventricles and sulci are normal for age. No intraparenchymal hemorrhage, mass effect nor midline shift. Patchy supratentorial white matter hypodensities are less than expected for patient's age and though non-specific suggest sequelae of chronic small vessel ischemic disease. No acute large vascular territory infarcts.  No abnormal extra-axial fluid collections. Basal cisterns are patent. Moderate calcific atherosclerosis of the carotid siphons.  Small left suboccipital scalp hematoma with bubbles of subcutaneous gas most consistent laceration, no radiopaque foreign bodies. No skull fracture. Similar focal fat within the clivus, nonspecific. The included ocular globes and orbital contents are non-suspicious. Status post bilateral ocular lens implant. Worsening hand paranasal sinusitis with near complete opacification of the maxillary sinuses partially imaged. Density within the right maxillary sinus without destructive bony lesions in the included view.  IMPRESSION: Small left suboccipital scalp hematoma with suspected laceration, no radiopaque foreign bodies or skull fracture. No acute intracranial process.  Worsening paranasal sinusitis, dense right maxillary component could reflect inspissated mucus or possible chronic fungal sinusitis.  Involutional changes. Mild white matter changes suggest chronic small vessel ischemic disease.   Electronically Signed   By: Elon Alas   On: 08/18/2013 06:28   Ct Chest Wo Contrast  08/18/2013   CLINICAL DATA:  Chest pain status post fall. Rib fractures on radiographs.  EXAM: CT CHEST WITHOUT CONTRAST  TECHNIQUE: Multidetector CT imaging of the chest was performed following the standard protocol without IV contrast.  COMPARISON:  Rib radiographs obtained  today.  Chest CT 10/20/2010.  FINDINGS: The heart size is stable. There is no evidence of mediastinal hematoma. Atherosclerosis of the aorta, great vessels and coronary arteries is noted. There is no significant pericardial effusion or mediastinal lymphadenopathy.  As noted on earlier radiographs, there are multiple mildly displaced acute posterior left rib fractures. There is associated soft tissue emphysema within the posterior left chest wall. There is also small amount of air within the anterior epicardial fat. There is no generalized pneumomediastinum or pneumothorax.  There is mild dependent atelectasis in both lungs. There is an 8 mm well-circumscribed nodule inferiorly in the right upper lobe (image 24). On the reformatted images, this lies superior to the minor fissure and appears slightly larger than on the prior study. No other pulmonary nodules are identified. There is no endobronchial lesion.  There is no evidence acute sternal or spinal fracture. The visualized upper abdomen appears unremarkable.  IMPRESSION: 1. Multiple left-sided rib fractures with associated soft tissue emphysema and air within the pre cardiac fat. No generalized pneumomediastinum or pneumothorax. 2. Bibasilar atelectasis. 3. Atherosclerosis. 4. Well-circumscribed right upper lobe pulmonary nodule may be slightly larger than on the prior study from 3 years ago. However, this does not  demonstrate any aggressive characteristics. Follow up chest CT in 1 year suggested to evaluate stability.   Electronically Signed   By: Camie Patience M.D.   On: 08/18/2013 10:33   Ct Cervical Spine Wo Contrast  08/18/2013   CLINICAL DATA:  Pain post trauma  EXAM: CT CERVICAL SPINE WITHOUT CONTRAST  TECHNIQUE: Multidetector CT imaging of the cervical spine was performed without intravenous contrast. Multiplanar CT image reconstructions were also generated.  COMPARISON:  None.  FINDINGS: There is no acute fracture or spondylolisthesis. There is evidence  of old trauma with remodeling in the spinous processes at C7 and T1. Prevertebral soft tissues and predental space regions are normal. There is moderately severe disc space narrowing at C4-5. There is milder disc space narrowing at C3-4 and C5-6. There is facet hypertrophy with exit foraminal narrowing at C4-5 of and C5-6 bilaterally. No disc extrusion or stenosis.  There is no apical pneumothorax apparent. Note that there is soft tissue air on the left and posteriorly, likely from the known rib fractures seen earlier in the day.  IMPRESSION: No acute fracture or spondylolisthesis. Soft tissue air is probably due to rib fractures. No apical pneumothorax seen on this study. Evidence suggesting old trauma involving the C7 and T1 spinous processes. 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   Electronically Signed   By: Lowella Grip M.D.   On: 08/18/2013 10:29   Dg Chest Port 1 View  08/19/2013   CLINICAL DATA:  On rib fractures ; pain and shortness of breath.  EXAM: PORTABLE CHEST - 1 VIEW  COMPARISON:  CT scan of the chest dated 18 Aug 2013  FINDINGS: The lungs are mildly hypoinflated. The costophrenic angle on the right sharp laterally. On the left there is some blunting of the costophrenic angle and partial obscuration of the hemidiaphragm. There is no pneumothorax. The cardiopericardial silhouette is normal in size. The pulmonary vascularity is not engorged. Are fractures through the lateral aspect of the left fourth, fifth, and sixth ribs. The fifth rib appears to be fractured more laterally as well. The mediastinum is normal in width.  IMPRESSION: There is no evidence of a pneumothorax. The previously described pneumomediastinum is not well demonstrated on the current exam. There is a small left pleural effusion consistent with a hemothorax given the acute rib fractures.   Electronically Signed   By: David  Martinique   On: 08/19/2013 06:24    Anti-infectives: Anti-infectives   Start      Dose/Rate Route Frequency Ordered Stop   08/18/13 0645  ceFAZolin (ANCEF) IVPB 1 g/50 mL premix     1 g 100 mL/hr over 30 Minutes Intravenous  Once 08/18/13 1443 08/18/13 0804      Assessment/Plan: Fall Back laceration - S/P closure, local care Multiple L posterior rib FXs - pulmonary toilet, IS, pain control. CXR this AM no PTX Syncope W/U - per primary team   LOS: 1 day    Georganna Skeans, MD, MPH, FACS Trauma: [phone] General Surgery: [phone]  08/19/2013

## 2013-08-20 ENCOUNTER — Encounter (HOSPITAL_COMMUNITY)
Admission: EM | Disposition: A | Discharge status: Skilled Nursing Facility | Payer: Medicare Other | Source: Home / Self Care | Attending: Thoracic Surgery (Cardiothoracic Vascular Surgery)

## 2013-08-20 ENCOUNTER — Inpatient Hospital Stay (HOSPITAL_COMMUNITY): Payer: Medicare Other

## 2013-08-20 ENCOUNTER — Other Ambulatory Visit: Payer: Self-pay

## 2013-08-20 DIAGNOSIS — E871 Hypo-osmolality and hyponatremia: Secondary | ICD-10-CM

## 2013-08-20 DIAGNOSIS — X58XXXA Exposure to other specified factors, initial encounter: Secondary | ICD-10-CM

## 2013-08-20 DIAGNOSIS — R339 Retention of urine, unspecified: Secondary | ICD-10-CM | POA: Diagnosis present

## 2013-08-20 DIAGNOSIS — R55 Syncope and collapse: Secondary | ICD-10-CM

## 2013-08-20 DIAGNOSIS — I214 Non-ST elevation (NSTEMI) myocardial infarction: Secondary | ICD-10-CM

## 2013-08-20 DIAGNOSIS — J90 Pleural effusion, not elsewhere classified: Secondary | ICD-10-CM | POA: Diagnosis present

## 2013-08-20 HISTORY — PX: LOOP RECORDER IMPLANT: SHX5477

## 2013-08-20 HISTORY — PX: LOOP RECORDER IMPLANT: SHX5954

## 2013-08-20 LAB — BASIC METABOLIC PANEL
BUN: 16 mg/dL (ref 6–23)
CO2: 21 mEq/L (ref 19–32)
Calcium: 8.5 mg/dL (ref 8.4–10.5)
Chloride: 97 mEq/L (ref 96–112)
Creatinine, Ser: 1.03 mg/dL (ref 0.50–1.35)
GFR calc Af Amer: 84 mL/min — ABNORMAL LOW (ref >90)
GFR calc non Af Amer: 73 mL/min — ABNORMAL LOW (ref >90)
Glucose, Bld: 151 mg/dL — ABNORMAL HIGH (ref 70–99)
Potassium: 4.7 mEq/L (ref 3.7–5.3)
Sodium: 130 mEq/L — ABNORMAL LOW (ref 137–147)

## 2013-08-20 LAB — CBC
HCT: 30 % — ABNORMAL LOW (ref 39.0–52.0)
HCT: 29.4 % — ABNORMAL LOW (ref 39.0–52.0)
Hemoglobin: 9.7 g/dL — ABNORMAL LOW (ref 13.0–17.0)
Hemoglobin: 9.6 g/dL — ABNORMAL LOW (ref 13.0–17.0)
MCH: 28.5 pg (ref 26.0–34.0)
MCH: 28.8 pg (ref 26.0–34.0)
MCHC: 32.3 g/dL (ref 30.0–36.0)
MCHC: 32.7 g/dL (ref 30.0–36.0)
MCV: 88.2 fL (ref 78.0–100.0)
MCV: 88.3 fL (ref 78.0–100.0)
Platelets: 222 10*3/uL (ref 150–400)
Platelets: 236 10*3/uL (ref 150–400)
RBC: 3.4 MIL/uL — ABNORMAL LOW (ref 4.22–5.81)
RBC: 3.33 MIL/uL — ABNORMAL LOW (ref 4.22–5.81)
RDW: 14.5 % (ref 11.5–15.5)
RDW: 14.4 % (ref 11.5–15.5)
WBC: 13.3 10*3/uL — ABNORMAL HIGH (ref 4.0–10.5)
WBC: 11.8 10*3/uL — ABNORMAL HIGH (ref 4.0–10.5)

## 2013-08-20 LAB — CK: Total CK: 352 U/L — ABNORMAL HIGH (ref 7–232)

## 2013-08-20 LAB — LIPID PANEL
Cholesterol: 165 mg/dL (ref 0–200)
HDL: 63 mg/dL (ref >39)
LDL Cholesterol: 79 mg/dL (ref 0–99)
Total CHOL/HDL Ratio: 2.6 RATIO
Triglycerides: 113 mg/dL (ref <150)
VLDL: 23 mg/dL (ref 0–40)

## 2013-08-20 LAB — GLUCOSE, CAPILLARY
Glucose-Capillary: 166 mg/dL — ABNORMAL HIGH (ref 70–99)
Glucose-Capillary: 197 mg/dL — ABNORMAL HIGH (ref 70–99)
Glucose-Capillary: 197 mg/dL — ABNORMAL HIGH (ref 70–99)
Glucose-Capillary: 212 mg/dL — ABNORMAL HIGH (ref 70–99)
Glucose-Capillary: 176 mg/dL — ABNORMAL HIGH (ref 70–99)

## 2013-08-20 LAB — TROPONIN I: Troponin I: 0.36 ng/mL (ref <0.30)

## 2013-08-20 SURGERY — LOOP RECORDER IMPLANT
Anesthesia: LOCAL

## 2013-08-20 MED ORDER — ONDANSETRON HCL 4 MG/2ML IJ SOLN
4.0000 mg | Freq: Four times a day (QID) | INTRAMUSCULAR | Status: DC | PRN
  Administered 2013-08-20: 4 mg via INTRAVENOUS
  Filled 2013-08-20: qty 2

## 2013-08-20 MED ORDER — REGADENOSON 0.4 MG/5ML IV SOLN
0.4000 mg | Freq: Once | INTRAVENOUS | Status: DC
  Filled 2013-08-20: qty 5

## 2013-08-20 MED ORDER — SODIUM CHLORIDE 0.9 % IV SOLN
INTRAVENOUS | Status: AC
  Administered 2013-08-20: 16:00:00 via INTRAVENOUS

## 2013-08-20 MED ORDER — METOPROLOL TARTRATE 12.5 MG HALF TABLET
12.5000 mg | ORAL_TABLET | Freq: Two times a day (BID) | ORAL | Status: DC
  Administered 2013-08-20 – 2013-08-23 (×6): 12.5 mg via ORAL
  Filled 2013-08-20 (×7): qty 1

## 2013-08-20 MED ORDER — LIDOCAINE-EPINEPHRINE 1 %-1:100000 IJ SOLN
INTRAMUSCULAR | Status: AC
  Filled 2013-08-20: qty 1

## 2013-08-20 MED ORDER — ACETAMINOPHEN 325 MG PO TABS: 325.0000 mg | ORAL_TABLET | ORAL | Status: DC | PRN

## 2013-08-20 NOTE — Progress Notes (Signed)
Trauma Service Note  Subjective: Patient still having left chest wall pain--at least I am presuming that it is chest wall pain with the rib fractures on that side.  Seems uncomfortable.  Objective: Vital signs in last 24 hours: Temp:  [97.7 F (36.5 C)-99 F (37.2 C)] 98.5 F (36.9 C) (05/20 0750) Pulse Rate:  [62-75] 73 (05/20 0750) Resp:  [9-21] 14 (05/20 0750) BP: (117-143)/(56-70) 131/63 mmHg (05/20 0750) SpO2:  [88 %-100 %] 98 % (05/20 0838) FiO2 (%):  [32 %] 32 % (05/19 1949) Weight:  [84.006 kg (185 lb 3.2 oz)] 84.006 kg (185 lb 3.2 oz) (05/20 0500) Last BM Date: 08/17/13  Intake/Output from previous day: 05/19 0701 - 05/20 0700 In: 180 [P.O.:180] Out: 1700 [Urine:1700] Intake/Output this shift:    General: Uncomfortable, not in severe distress   Lungs: Decreased breath sounds in the bases, L>R.  No rales or rhonchi.  CXR shows bibasilar atelectasis.  Abd: Benign, good bowel sounds.  Extremities: No problems  Neuro: Intact  Lab Results: CBC   Recent Labs  08/18/13 0643 08/18/13 0658 08/19/13 0059  WBC 12.9*  --  9.2  HGB 11.9* 13.6 11.0*  HCT 36.8* 40.0 34.4*  PLT 279  --  232   BMET  Recent Labs  08/19/13 0059 08/20/13 0433  NA 136* 130*  K 4.5 4.7  CL 102 97  CO2 22 21  GLUCOSE 88 151*  BUN 19 16  CREATININE 1.18 1.03  CALCIUM 8.3* 8.5   PT/INR  Recent Labs  08/18/13 1345  LABPROT 14.1  INR 1.11   ABG  Recent Labs  08/18/13 1345  PHART 7.382  HCO3 20.5    Studies/Results: Dg Lumbar Spine 2-3 Views  08/18/2013   CLINICAL DATA:  Low back and left leg pain.  EXAM: LUMBAR SPINE - 2-3 VIEW  COMPARISON:  Abdominal CT 05/18/2011.  FINDINGS: There are 5 lumbar type vertebral bodies. The alignment is normal. There is no evidence of acute fracture or focal disc space loss. Paraspinal osteophytes are stable.  IMPRESSION: No evidence of acute lumbar spine injury.   Electronically Signed   By: Camie Patience M.D.   On: 08/18/2013 10:23    Ct Chest Wo Contrast  08/18/2013   CLINICAL DATA:  Chest pain status post fall. Rib fractures on radiographs.  EXAM: CT CHEST WITHOUT CONTRAST  TECHNIQUE: Multidetector CT imaging of the chest was performed following the standard protocol without IV contrast.  COMPARISON:  Rib radiographs obtained today.  Chest CT 10/20/2010.  FINDINGS: The heart size is stable. There is no evidence of mediastinal hematoma. Atherosclerosis of the aorta, great vessels and coronary arteries is noted. There is no significant pericardial effusion or mediastinal lymphadenopathy.  As noted on earlier radiographs, there are multiple mildly displaced acute posterior left rib fractures. There is associated soft tissue emphysema within the posterior left chest wall. There is also small amount of air within the anterior epicardial fat. There is no generalized pneumomediastinum or pneumothorax.  There is mild dependent atelectasis in both lungs. There is an 8 mm well-circumscribed nodule inferiorly in the right upper lobe (image 24). On the reformatted images, this lies superior to the minor fissure and appears slightly larger than on the prior study. No other pulmonary nodules are identified. There is no endobronchial lesion.  There is no evidence acute sternal or spinal fracture. The visualized upper abdomen appears unremarkable.  IMPRESSION: 1. Multiple left-sided rib fractures with associated soft tissue emphysema and air within the pre  cardiac fat. No generalized pneumomediastinum or pneumothorax. 2. Bibasilar atelectasis. 3. Atherosclerosis. 4. Well-circumscribed right upper lobe pulmonary nodule may be slightly larger than on the prior study from 3 years ago. However, this does not demonstrate any aggressive characteristics. Follow up chest CT in 1 year suggested to evaluate stability.   Electronically Signed   By: Camie Patience M.D.   On: 08/18/2013 10:33   Ct Cervical Spine Wo Contrast  08/18/2013   CLINICAL DATA:  Pain post  trauma  EXAM: CT CERVICAL SPINE WITHOUT CONTRAST  TECHNIQUE: Multidetector CT imaging of the cervical spine was performed without intravenous contrast. Multiplanar CT image reconstructions were also generated.  COMPARISON:  None.  FINDINGS: There is no acute fracture or spondylolisthesis. There is evidence of old trauma with remodeling in the spinous processes at C7 and T1. Prevertebral soft tissues and predental space regions are normal. There is moderately severe disc space narrowing at C4-5. There is milder disc space narrowing at C3-4 and C5-6. There is facet hypertrophy with exit foraminal narrowing at C4-5 of and C5-6 bilaterally. No disc extrusion or stenosis.  There is no apical pneumothorax apparent. Note that there is soft tissue air on the left and posteriorly, likely from the known rib fractures seen earlier in the day.  IMPRESSION: No acute fracture or spondylolisthesis. Soft tissue air is probably due to rib fractures. No apical pneumothorax seen on this study. Evidence suggesting old trauma involving the C7 and T1 spinous processes. 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   Electronically Signed   By: Lowella Grip M.D.   On: 08/18/2013 10:29   Dg Chest Port 1 View  08/19/2013   CLINICAL DATA:  On rib fractures ; pain and shortness of breath.  EXAM: PORTABLE CHEST - 1 VIEW  COMPARISON:  CT scan of the chest dated 18 Aug 2013  FINDINGS: The lungs are mildly hypoinflated. The costophrenic angle on the right sharp laterally. On the left there is some blunting of the costophrenic angle and partial obscuration of the hemidiaphragm. There is no pneumothorax. The cardiopericardial silhouette is normal in size. The pulmonary vascularity is not engorged. Are fractures through the lateral aspect of the left fourth, fifth, and sixth ribs. The fifth rib appears to be fractured more laterally as well. The mediastinum is normal in width.  IMPRESSION: There is no evidence of a pneumothorax. The  previously described pneumomediastinum is not well demonstrated on the current exam. There is a small left pleural effusion consistent with a hemothorax given the acute rib fractures.   Electronically Signed   By: David  Martinique   On: 08/19/2013 06:24    Anti-infectives: Anti-infectives   Start     Dose/Rate Route Frequency Ordered Stop   08/18/13 0645  ceFAZolin (ANCEF) IVPB 1 g/50 mL premix     1 g 100 mL/hr over 30 Minutes Intravenous  Once 08/18/13 0641 08/18/13 0804      Assessment/Plan: s/p  Pain control. Medicine there for follow up and wants EKG for left chest pain.  Could be cardiac.  LOS: 2 days   Kathryne Eriksson. Dahlia Bailiff, MD, FACS [phone] Trauma Surgeon 08/20/2013

## 2013-08-20 NOTE — Progress Notes (Signed)
I repeated the critical or key portions of the exam.  I confirmed/revised the medical student's history, exam, assessment and plan.

## 2013-08-20 NOTE — CV Procedure (Signed)
Electrophysiology procedure note  Procedure: Insertion of an implantable loop recorder  Indication: Unexplained syncope  Description of procedure: After informed consent was obtained, the patient was prepped and draped in the usual manner. 20 cc of lidocaine was infiltrated into the left pectoral region. A 1 cm stab incision was carried out, and the Medtronic implantable loop recorder, serial [id number] was inserted into the subcutaneous space. The R wave measured 0.3 mV. A bandage was placed over the skin, pressure was held, and the patient was returned to his room in satisfactory condition.  Complications: There were no immediate procedure complications  Conclusion: Successful insertion of a Medtronic implantable loop recorder and the patient with unexplained syncope.  Cristopher Peru, M.D.

## 2013-08-20 NOTE — Progress Notes (Addendum)
Subjective:   VSS.  Pt had bladder scan yesterday which showed ~900cc, in/out cath had 1200cc out--pt reported feeling better.  However, when I saw him this AM he was diaphoretic and complaining of left sided chest pain.  He was also hypotensive.    Will get cbc, stat CXR, and EKG.     Objective:   Vital signs in last 24 hours: Filed Vitals:   08/20/13 0750 08/20/13 0838 08/20/13 1230 08/20/13 1243  BP: 131/63  94/56 134/64  Pulse: 73  81   Temp: 98.5 F (36.9 C)  97.4 F (36.3 C)   TempSrc: Oral  Oral   Resp: 14  27   Height:      Weight:      SpO2: 98% 98% 94%     Weight: Filed Weights   08/18/13 1600 08/19/13 0400 08/20/13 0500  Weight: 178 lb 2.1 oz (80.8 kg) 185 lb 3 oz (84 kg) 185 lb 3.2 oz (84.006 kg)    Ins/Outs:  Intake/Output Summary (Last 24 hours) at 08/20/13 1338 Last data filed at 08/20/13 1232  Gross per 24 hour  Intake    180 ml  Output   2075 ml  Net  -1895 ml    Physical Exam: Constitutional: Vital signs reviewed.  Patient is sitting up in bed appearing in severe distress and diaphoretic complaining of left-sided chest pain.    HEENT: [place]/AT; PERRL, EOMI, conjunctivae normal, no scleral icterus  Cardiovascular: RRR, no MRG Pulmonary/Chest: normal respiratory effort, no accessory muscle use, CTAB, no wheezes, rales, or rhonchi Abdominal: Soft. +BS, NT/ND Neurological: A&O x3, CN II-XII grossly intact; non-focal exam Musculoskeletal: 15cm laceration R upper back at approximately the T3 level.   Abrasions noted left mid thoracic and back region with TTP. No midline spinal tenderness noted.  Extremities: 2+DP b/l, no C/C/E  Skin: Warm, dry.    Lab Results:  BMP:  Recent Labs Lab 08/18/13 0658 08/18/13 1345 08/19/13 0059 08/20/13 0433  NA 139  --  136* 130*  K 4.8  --  4.5 4.7  CL 105  --  102 97  CO2  --   --  22 21  GLUCOSE 178*  --  88 151*  BUN 21  --  19 16  CREATININE 1.60*  1.70*  --  1.18 1.03  CALCIUM  --   --  8.3* 8.5    MG  --  2.0  --   --    Anion Gap:  12  CBC:  Recent Labs Lab 08/18/13 0643  08/19/13 0059 08/20/13 1010  WBC 12.9*  --  9.2 13.3*  NEUTROABS 11.3*  --   --   --   HGB 11.9*  < > 11.0* 9.7*  HCT 36.8*  < > 34.4* 30.0*  MCV 86.2  --  88.9 88.2  PLT 279  --  232 222  < > = values in this interval not displayed.  Coagulation:  Recent Labs Lab 08/18/13 1345  LABPROT 14.1  INR 1.11    CBG:            Recent Labs Lab 08/19/13 1725 08/19/13 2008 08/19/13 2103 08/20/13 0748 08/20/13 0903 08/20/13 1227  GLUCAP 162* 146* 148* 166* 197* 197*           HA1C:       Recent Labs Lab 08/18/13 1345  HGBA1C 7.4*    Lipid Panel:  Recent Labs Lab 08/20/13 0433  CHOL 165  HDL 63  LDLCALC 79  TRIG 113  CHOLHDL 2.6    LFTs:  Recent Labs Lab 08/18/13 1345  AST 32  ALT 17  ALKPHOS 50  BILITOT 0.8  PROT 7.1  ALBUMIN 3.5    Pancreatic Enzymes: No results found for this basename: LIPASE, AMYLASE,  in the last 168 hours  Lactic Acid/Procalcitonin:  Recent Labs Lab 08/18/13 0643 08/18/13 1345  LATICACIDVEN 2.4* 2.0    Ammonia: No results found for this basename: AMMONIA,  in the last 168 hours  Cardiac Enzymes:  Recent Labs Lab 08/18/13 1345  08/19/13 1652 08/19/13 2247 08/20/13 0433 08/20/13 1010  CKTOTAL 537*  --   --   --  352*  --   CKMB 7.0*  --   --   --   --   --   TROPONINI 0.32*  < > 0.70* 0.59*  --  0.36*  < > = values in this interval not displayed.  EKG: EKG Interpretation  Date/Time:  Monday Aug 18 2013 04:59:50 EDT Ventricular Rate:  78 PR Interval:  169 QRS Duration: 107 QT Interval:  394 QTC Calculation: 449 R Axis:   6 Text Interpretation:  Sinus rhythm Anterior infarct, old ED PHYSICIAN INTERPRETATION AVAILABLE IN [facility] [place] Confirmed by TEST, Record ([id number]) on 08/20/2013 7:15:39 AM   BNP: No results found for this basename: PROBNP,  in the last 168 hours  D-Dimer: No results found for this basename:  DDIMER,  in the last 168 hours  Urinalysis:  Recent Labs Lab 08/18/13 1200  COLORURINE YELLOW  LABSPEC 1.014  PHURINE 5.0  GLUCOSEU NEGATIVE  HGBUR NEGATIVE  BILIRUBINUR NEGATIVE  KETONESUR 15*  PROTEINUR NEGATIVE  UROBILINOGEN 0.2  NITRITE NEGATIVE  LEUKOCYTESUR TRACE*    Micro Results: Recent Results (from the past 240 hour(s))  MRSA PCR SCREENING     Status: None   Collection Time    08/18/13  4:00 PM      Result Value Ref Range Status   MRSA by PCR NEGATIVE  NEGATIVE Final   Comment:            The GeneXpert MRSA Assay (FDA     approved for NASAL specimens     only), is one component of a     comprehensive MRSA colonization     surveillance program. It is not     intended to diagnose MRSA     infection nor to guide or     monitor treatment for     MRSA infections.    Blood Culture: No results found for this basename: sdes,  specrequest,  cult,  reptstatus    Studies/Results: Dg Chest 2 View  08/20/2013   CLINICAL DATA:  Recent rib fractures and pneumothorax, followup  EXAM: CHEST  2 VIEW  COMPARISON:  DG CHEST 1V PORT dated 08/19/2013  FINDINGS: Lungs are hypoaerated with crowding of the bronchovascular markings. Heart size at upper limits of normal. Improved aeration is noted at the lung bases. No pneumothorax or pneumomediastinum identified. Nondisplaced left lateral rib fractures are reidentified.  IMPRESSION: Improved aeration with persistent left lower lobe opacity which could represent atelectasis or contusion in the setting of multiple left-sided rib fractures.  No pneumothorax or pneumomediastinum.   Electronically Signed   By: Conchita Paris M.D.   On: 08/20/2013 09:42   Dg Chest Port 1 View  08/19/2013   CLINICAL DATA:  On rib fractures ; pain and shortness of breath.  EXAM: PORTABLE CHEST - 1 VIEW  COMPARISON:  CT scan of the  chest dated 18 Aug 2013  FINDINGS: The lungs are mildly hypoinflated. The costophrenic angle on the right sharp laterally. On the  left there is some blunting of the costophrenic angle and partial obscuration of the hemidiaphragm. There is no pneumothorax. The cardiopericardial silhouette is normal in size. The pulmonary vascularity is not engorged. Are fractures through the lateral aspect of the left fourth, fifth, and sixth ribs. The fifth rib appears to be fractured more laterally as well. The mediastinum is normal in width.  IMPRESSION: There is no evidence of a pneumothorax. The previously described pneumomediastinum is not well demonstrated on the current exam. There is a small left pleural effusion consistent with a hemothorax given the acute rib fractures.   Electronically Signed   By: David  Martinique   On: 08/19/2013 06:24    Medications:  Scheduled Meds: . aspirin EC  81 mg Oral Daily  . atorvastatin  40 mg Oral q1800  . bacitracin   Topical BID  . insulin aspart  0-9 Units Subcutaneous TID WC  . insulin glargine  10 Units Subcutaneous QHS  . methocarbamol  1,500 mg Oral QID  . metoprolol tartrate  37.5 mg Oral BID  . mometasone-formoterol  2 puff Inhalation BID  . pantoprazole  40 mg Oral Daily  . [START ON 08/22/2013] regadenoson  0.4 mg Intravenous Once  . sodium chloride  3 mL Intravenous Q12H  . Tdap  0.5 mL Intramuscular Once  . traMADol  100 mg Oral 4 times per day   Continuous Infusions:  PRN Meds: calcium carbonate, ondansetron (ZOFRAN) IV, oxyCODONE  Antibiotics: Antibiotics Given (last 72 hours)   None      Day of Hospitalization: 2  Consults: Treatment Team:  Trauma Md, MD Rounding Lbcardiology, MD  Assessment/Plan:   Principal Problem:   Syncope Active Problems:   HYPERTENSION   Coronary artery disease   Ischemic cardiomyopathy   Diabetes mellitus   Chronic systolic heart failure   Multiple fractures of ribs of left side   Laceration of skin of scalp   Hematoma of occipital surface of head   Chronic sinusitis   Laceration of skin   NSTEMI (non-ST elevated myocardial  infarction)   Pleural effusion   Urine retention  Acute left-sided chest pain Pt complains of left sided chest pain this AM and appears acutely ill with diaphoresis.  No N/V.  Troponin x 1 is 0.36 which is trending down from 5/18.  Hgb is 9.7 which is down from 11.0.  CXR without pneumothorax or pneumomediastinum with improved aeration with persistent LLL opacity which could represent atx or contusion in setting of multiple left-sided rib fractures.  Cardiology plans to do myoview while inpatient.  -will repeat cbc at 1400 today  Syncope  Stable.  Trops trending down.  EP consulted and thinks possibly orthostatic hypotension and would hold off on further evaluation until echo completed.  5/19 echo: LVEF 55-60% with LA dilation but unchanged from 2013 echo.  Additionally, pt states he had an alcoholic beverage on that evening which also may have contributed.   -appreciate EP recs  NSTEMI Trops have trended down.  Lipid panel OK.   -increased atorvastatin to 40mg  qd  Multiple segmental rib fractures at risk for flail chest CXR showed multiple nondisplaced left-sided rib segmental fractures.  CT Chest showed multiple left-sided rib fractures with associated soft tissue emphysema and air within the pre cardiac fat.  No generalized pneumomediastinum or pneumothorax.  CK has trended down.  -pain management -pulmonary  toilet/IS -close monitor for s/s of flail chest--need to consult trauma and PCCM if indicated  Upper back skin laceration 15 cm laceration at risk for skin necrosis.  Trauma consulted in ED and laceration repaired by trama service. -monitor  Left suboccipital scalp hematoma and laceration No active bleeding noted -monitor  AKI Resolved.  Baseline is 1.1-1.2.  He was noted to have mild elevated Cr at 1.7 on admission. CK trending down.  -hold lasix -avoid nephrotoxic meds -BMP in am  Leukocytosis Resolved. Likely stress response.  HYPERTENSION BP stable.  On home lasix,  imdur and metoprolol.  -will hold lasix and imdur given AKI and syncope workup. -continue metoprolol  DMII Lab Results  Component Value Date   HGBA1C 7.4* 08/18/2013  -hold home glipizide -Lantus 10 units QHS -SSI  Chronic sinusitis This is an incidental finding on CT head.   -follow-up as outpatient    Mild hyponatremia -monitor  VTE SCD  Dispo: Disposition is deferred at this time, awaiting improvement of current medical problems. Anticipated discharge in approximately 2-3 day(s).   The patient does have a current PCP Charolotte Eke Powers) and does need an [facility] hospital follow-up appointment after discharge.   Signed: Jones Bales, MD 08/20/2013, 1:38 PM

## 2013-08-20 NOTE — Progress Notes (Signed)
Patient Name: Gerald Rosario      SUBJECTIVE:recuurrent syncope without warning Rib fractures Sob and diaphoretic this am No recent cp or sob Does have intermittent orthostatic dizziness    Past Medical History  Diagnosis Date  . Hypertension   . Asthma     start dulera 100 April 12,2011 > better but "knot in throat" so try qvar June 7,2011 > preferred dulera. HFA 90% May 10,2011 > 90% October 17,2011. PFT's June 7,2011 wnl x minimal nonspecific mid flow reduction while on dulera. Changed to advair intermediate strength October 17,2011 due to ins issue  . Ischemic cardiomyopathy     Repeat Cardiac MRI - EF 52%, distal Septal & apical Akinesis (suggest scar), unable to assess viability due to patient movement  . Hoarseness 12-20-11    onset 11/09. neg w/u 09/2008. saw Dr. Raelene Bott. 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 presyncope   . Enlarged prostate 12-20-11    hx. -has Foley cath at present for retention  . Dyspnea   . Pulmonary nodule   . Hyperlipidemia   . CAD (coronary artery disease) July 2012    Severe three-vessel disease - with occluded LAD and circumflex with 80% diagonal disease. Moderate disease in the ramus with diffusely  . H/O hyperkalemia   . Hearing loss   . Vocal cord dysfunction   . Enlarged prostate   . 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  . CHF (congestive heart failure) 12-20-11    Most recent echo 03/2012: EF improved to 55-60% with apical hypokinesis. Grade 2 diastolic dysfunction.  . Diabetes mellitus 12-20-11    Dabetes-many yrs-Lantus since 1 yr.  . Chronic kidney disease 12-20-11    past hx. with elevated Potassium-tx. meds, has no renal  MD, pt. states is improved.  . NSTEMI (non-ST elevated myocardial infarction)     Scheduled Meds:  Scheduled Meds: . aspirin EC  81 mg Oral Daily  . atorvastatin  40 mg Oral q1800  . bacitracin   Topical BID  . insulin aspart   0-9 Units Subcutaneous TID WC  . insulin glargine  10 Units Subcutaneous QHS  . methocarbamol  1,500 mg Oral QID  . metoprolol tartrate  37.5 mg Oral BID  . mometasone-formoterol  2 puff Inhalation BID  . pantoprazole  40 mg Oral Daily  . sodium chloride  3 mL Intravenous Q12H  . Tdap  0.5 mL Intramuscular Once  . traMADol  100 mg Oral 4 times per day   Continuous Infusions:   PHYSICAL EXAM Filed Vitals:   08/20/13 0400 08/20/13 0500 08/20/13 0750 08/20/13 0838  BP: 132/69  131/63   Pulse: 73  73   Temp: 99 F (37.2 C)  98.5 F (36.9 C)   TempSrc: Oral  Oral   Resp: 17  14   Height:      Weight:  185 lb 3.2 oz (84.006 kg)    SpO2: 99%  98% 98%   Well developed and nourished in mode distress HENT normal Neck supple with JVP-flat Crackles bibasilar Regular rate and rhythm, no murmurs or gallops Abd-soft with active BS No Clubbing cyanosis edema Skin-warm and diaphoretic A & Oriented  Grossly normal sensory and motor function   TELEMETRY: Reviewed telemetry pt in nsr :    Intake/Output Summary (Last 24 hours) at 08/20/13 0847 Last data filed at 08/20/13 0500  Gross per 24 hour  Intake    180 ml  Output   1700 ml  Net  -1520 ml    LABS: Basic Metabolic Panel:  Recent Labs Lab 08/18/13 0643 08/18/13 0658 08/18/13 1345 08/19/13 0059 08/20/13 0433  NA 138 139  --  136* 130*  K 4.9 4.8  --  4.5 4.7  CL 100 105  --  102 97  CO2 22  --   --  22 21  GLUCOSE 184* 178*  --  88 151*  BUN 22 21  --  19 16  CREATININE 1.48* 1.60*  1.70*  --  1.18 1.03  CALCIUM 9.3  --   --  8.3* 8.5  MG  --   --  2.0  --   --    Cardiac Enzymes:  Recent Labs  08/18/13 1345  08/19/13 1123 08/19/13 1652 08/19/13 2247 08/20/13 0433  CKTOTAL 537*  --   --   --   --  352*  CKMB 7.0*  --   --   --   --   --   TROPONINI 0.32*  < > 0.79* 0.70* 0.59*  --   < > = values in this interval not displayed. CBC:  Recent Labs Lab 08/18/13 0643 08/18/13 0658 08/19/13 0059  WBC  12.9*  --  9.2  NEUTROABS 11.3*  --   --   HGB 11.9* 13.6 11.0*  HCT 36.8* 40.0 34.4*  MCV 86.2  --  88.9  PLT 279  --  232   PROTIME:  Recent Labs  08/18/13 1345  LABPROT 14.1  INR 1.11   Liver Function Tests:  Recent Labs  08/18/13 1345  AST 32  ALT 17  ALKPHOS 50  BILITOT 0.8  PROT 7.1  ALBUMIN 3.5   No results found for this basename: LIPASE, AMYLASE,  in the last 72 hours BNP: BNP (last 3 results) No results found for this basename: PROBNP,  in the last 8760 hours D-Dimer: No results found for this basename: DDIMER,  in the last 72 hours Hemoglobin A1C:  Recent Labs  08/18/13 1345  HGBA1C 7.4*   Fasting Lipid Panel:  Recent Labs  08/20/13 0433  CHOL 165  HDL 63  LDLCALC 79  TRIG 113  CHOLHDL 2.6   Thyroid Function Tests: No results found for this basename: TSH, T4TOTAL, FREET3, T3FREE, THYROIDAB,  in the last 72 hours Anemia Panel:   ASSESSMENT AND PLAN:  Principal Problem:   Syncope Active Problems:   HYPERTENSION   Coronary artery disease   Ischemic cardiomyopathy    Multiple fractures of ribs of left side   Laceration of skin of scalp   Hematoma of occipital surface of head   Laceration of skin   NSTEMI (non-ST elevated myocardial infarction)  Agree with inhouse myoview for risk stratificationn  Would consider LINQ for recurrent syncope prior to discharge   Signed, Deboraha Sprang MD  08/20/2013

## 2013-08-20 NOTE — Interval H&P Note (Signed)
History and Physical Interval Note:Patient seen and examined. I agree with the assessment of Dr. Caryl Comes and would recommend proceeding with ILR. The patient wishes to proceed.  08/20/2013 2:46 PM  Gerald Rosario  has presented today for surgery, with the diagnosis of syncope  The various methods of treatment have been discussed with the patient and family. After consideration of risks, benefits and other options for treatment, the patient has consented to  Procedure(s): LOOP RECORDER IMPLANT (N/A) as a surgical intervention .  The patient's history has been reviewed, patient examined, no change in status, stable for surgery.  I have reviewed the patient's chart and labs.  Questions were answered to the patient's satisfaction.     Evans Lance

## 2013-08-20 NOTE — Progress Notes (Signed)
Day 2 of stay      Patient name: Gerald Rosario  Medical record number: [id number]  Date of birth: 1945-09-21   Gerald Rosario reports more pain today. He was cold and clammy in the morning before I saw him, and had relatively low BP 90/58 (saturations were >90%) measured by my medical student. He received his pain medication and feels better. He says that his pain increased when he had to go get the chest xrays.   Filed Vitals:   08/20/13 0400 08/20/13 0500 08/20/13 0750 08/20/13 0838  BP: 132/69  131/63   Pulse: 73  73   Temp: 99 F (37.2 C)  98.5 F (36.9 C)   TempSrc: Oral  Oral   Resp: 17  14   Height:      Weight:  185 lb 3.2 oz (84.006 kg)    SpO2: 99%  98% 98%     Physical Exam - air movement on both lungs anterior exam R>L. Left chest tender to palpation as before. No other tender site in the body. No tachycardia. No murmur. No pain or swelling in legs.    Recent Labs Lab 08/18/13 0643 08/18/13 0658 08/19/13 0059 08/20/13 1010  HGB 11.9* 13.6 11.0* 9.7*  HCT 36.8* 40.0 34.4* 30.0*  WBC 12.9*  --  9.2 13.3*  PLT 279  --  232 222    Recent Labs Lab 08/18/13 0643 08/18/13 0658 08/18/13 1345 08/19/13 0059 08/20/13 0433  NA 138 139  --  136* 130*  K 4.9 4.8  --  4.5 4.7  CL 100 105  --  102 97  CO2 22  --   --  22 21  GLUCOSE 184* 178*  --  88 151*  BUN 22 21  --  19 16  CREATININE 1.48* 1.60*  1.70*  --  1.18 1.03  CALCIUM 9.3  --   --  8.3* 8.5  MG  --   --  2.0  --   --       Recent Labs Lab 08/19/13 0059 08/19/13 1123 08/19/13 1652 08/19/13 2247 08/20/13 1010  TROPONINI 1.72* 0.79* 0.70* 0.59* 0.36*    CXR 08/20/13 impression: Improved aeration with persistent left lower lobe opacity which could represent atelectasis or contusion in the setting of multiple left-sided rib fractures. No pneumothorax or pneumomediastinum.    Medications  . aspirin EC  81 mg Oral Daily  . atorvastatin  40 mg Oral q1800  . bacitracin   Topical BID  .  insulin aspart  0-9 Units Subcutaneous TID WC  . insulin glargine  10 Units Subcutaneous QHS  . methocarbamol  1,500 mg Oral QID  . metoprolol tartrate  37.5 mg Oral BID  . mometasone-formoterol  2 puff Inhalation BID  . pantoprazole  40 mg Oral Daily  . [START ON 08/22/2013] regadenoson  0.4 mg Intravenous Once  . sodium chloride  3 mL Intravenous Q12H  . Tdap  0.5 mL Intramuscular Once  . traMADol  100 mg Oral 4 times per day     Assessment and Plan   Rib fractures - Increased pain and decreased BP this morning made [place] think of possible differentials - pneumothorax, hemothorax, worsening NSTEMI, PE, pneumonia - medication induced. Monitoring for hypoxia, hemo or pneumothorax - none so far. Pain control adequate per patient. Left sided opacity on CXR unchanged. Incentive spirometry to continue. No new medications started yet. His LE seem to have no swelling or pain. If hypoxia  happens again and persists - we will do duplex scan for DVT to get an idea about possible PE. Right now the patient is not hypoxic or tachycardic.   NSTEMI - We checked another EKG and trop with the events of the morning and they were negative for new changes. Troponin still trending down. Cardiology recommendations for myoview stress test.  ECHO done yesterday showsLVEF 55-60% with no wall motion abnormalities.   Syncope - cardiology suggested EP studies. We will touch base with them regarding that. No further syncope in the hospital    I have discussed the care of this patient with my IM team residents. Please see the resident note for details.  Gerald Rosario 08/20/2013, 11:43 AM.

## 2013-08-20 NOTE — Progress Notes (Signed)
Subjective: Patient was looking in distress, C/O left sided chest pain going down to his left abdomen, SOB, diaphoresis and dizziness. He states that pain started after his CXR this morning , getting worse, non radiating, 10/10 in intensity. C/O mild nausea with pain but no vomiting, also C/O anorexia since this morning too. Objective: Vital signs in last 24 hours: Filed Vitals:   08/20/13 0400 08/20/13 0500 08/20/13 0750 08/20/13 0838  BP: 132/69  131/63   Pulse: 73  73   Temp: 99 F (37.2 C)  98.5 F (36.9 C)   TempSrc: Oral  Oral   Resp: 17  14   Height:      Weight:  84.006 kg (185 lb 3.2 oz)    SpO2: 99%  98% 98%   Weight change: 3.206 kg (7 lb 1.1 oz)  Intake/Output Summary (Last 24 hours) at 08/20/13 1138 Last data filed at 08/20/13 0500  Gross per 24 hour  Intake    180 ml  Output   1700 ml  Net  -1520 ml   EXAM:  BP at 9.00am was 97/58 , Pulse 85, O2 sat. 95% on 2L of O2. CBG 197 GENERAL: Patient look pale with cold, clammy and sweaty skin. In distress. HEENT: PERRL, EOMI NECK: supple, no JVD or carotid bruit. LUNG: Decreased breath sound, more decreased  on left . No added sound HEART: RRR, S1 and S@, no MRG NEURO;A & OX 3. Moving all extremities, no gross neuro. Deficit EXT: no edema, Hands and feet looks more pallor then rest of body, no cyanosis Lab Results: @LABTEST2 @ Micro Results: Recent Results (from the past 240 hour(s))  MRSA PCR SCREENING     Status: None   Collection Time    08/18/13  4:00 PM      Result Value Ref Range Status   MRSA by PCR NEGATIVE  NEGATIVE Final   Comment:            The GeneXpert MRSA Assay (FDA     approved for NASAL specimens     only), is one component of a     comprehensive MRSA colonization     surveillance program. It is not     intended to diagnose MRSA     infection nor to guide or     monitor treatment for     MRSA infections.   Studies/Results: Dg Chest 2 View  08/20/2013   CLINICAL DATA:  Recent rib  fractures and pneumothorax, followup  EXAM: CHEST  2 VIEW  COMPARISON:  DG CHEST 1V PORT dated 08/19/2013  FINDINGS: Lungs are hypoaerated with crowding of the bronchovascular markings. Heart size at upper limits of normal. Improved aeration is noted at the lung bases. No pneumothorax or pneumomediastinum identified. Nondisplaced left lateral rib fractures are reidentified.  IMPRESSION: Improved aeration with persistent left lower lobe opacity which could represent atelectasis or contusion in the setting of multiple left-sided rib fractures.  No pneumothorax or pneumomediastinum.   Electronically Signed   By: Conchita Paris M.D.   On: 08/20/2013 09:42   Dg Chest Port 1 View  08/19/2013   CLINICAL DATA:  On rib fractures ; pain and shortness of breath.  EXAM: PORTABLE CHEST - 1 VIEW  COMPARISON:  CT scan of the chest dated 18 Aug 2013  FINDINGS: The lungs are mildly hypoinflated. The costophrenic angle on the right sharp laterally. On the left there is some blunting of the costophrenic angle and partial obscuration of the hemidiaphragm. There is no  pneumothorax. The cardiopericardial silhouette is normal in size. The pulmonary vascularity is not engorged. Are fractures through the lateral aspect of the left fourth, fifth, and sixth ribs. The fifth rib appears to be fractured more laterally as well. The mediastinum is normal in width.  IMPRESSION: There is no evidence of a pneumothorax. The previously described pneumomediastinum is not well demonstrated on the current exam. There is a small left pleural effusion consistent with a hemothorax given the acute rib fractures.   Electronically Signed   By: David  Martinique   On: 08/19/2013 06:24   Medications: medication reviewed Scheduled Meds: . aspirin EC  81 mg Oral Daily  . atorvastatin  40 mg Oral q1800  . bacitracin   Topical BID  . insulin aspart  0-9 Units Subcutaneous TID WC  . insulin glargine  10 Units Subcutaneous QHS  . methocarbamol  1,500 mg Oral  QID  . metoprolol tartrate  37.5 mg Oral BID  . mometasone-formoterol  2 puff Inhalation BID  . pantoprazole  40 mg Oral Daily  . [START ON 08/22/2013] regadenoson  0.4 mg Intravenous Once  . sodium chloride  3 mL Intravenous Q12H  . Tdap  0.5 mL Intramuscular Once  . traMADol  100 mg Oral 4 times per day   Continuous Infusions:  PRN Meds:.calcium carbonate, ondansetron (ZOFRAN) IV, oxyCODONE Assessment/Plan: Principal Problem:   Syncope Active Problems:   HYPERTENSION   Coronary artery disease   Ischemic cardiomyopathy   Diabetes mellitus   Chronic systolic heart failure   Multiple fractures of ribs of left side   Laceration of skin of scalp   Hematoma of occipital surface of head   Chronic sinusitis   Laceration of skin   NSTEMI (non-ST elevated myocardial infarction) SYNCOPE: This morning around 8.45am he has subjective presyncope with diaphoresis, left chest pain, cold and clammy skin. His BP was little low as compare to this morning without any tachycardia,he was due for his pain meds. Too. WE did stat EKG which shows no new change as compare to his prior, send blood for troponin, gave his oxycodone. Discussed about giving him a fluid bolus with Dr. Aundra Dubin she advised to wait and recheck BP if there is a down trend we will do that. Hold his BP meds. Till his BP normalizes. Patient was checked agin with the whole team, was slightly better, still having pale peripheries but face looks better, still C/O left sided chest pain although has decreased after oxycodone, skin was warmer then before, no diaphoresis. Pain can be just due to his rib fractures, or cardiac in origin. Keep monitoring him with more frequent vital check, ordered for Q4H and see his troponin trend. NSTEMI: Although his previous troponin was trending down with 0.59 last one, recheck after this morning incident. His Echo done yesterday shows no significant change as compare to prior with EF of 55-60%. Cardialogy advised  LINQ  To rule out any arrythmia.Continue monitoring him. RIB FRACTURE: Having more pain this morning, his today's CXR does not show any pneumothorex or pneumomediastinum. Shows bibasilar atelectasis. BACK LACERATION:Continue with wound care.  SCALP LACERATION: Healing today, keep monitor for any sign of infection HTN: his BP was on the lower side today, hold his lopressor till it normalized. DM: His blood CBG is little higher with A1C of 7.4. Novolog and lantus 10 unit was started yesyerday, monitor his response.  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  This is a Careers information officer Note.  The care of the patient was discussed with Dr. Aundra Dubin and the assessment and plan formulated with their assistance.  Please see their attached note for official documentation of the daily encounter.   LOS: 2 days   Lorella Nimrod, Med Student 08/20/2013, 11:38 AM

## 2013-08-20 NOTE — Progress Notes (Signed)
Reviewed

## 2013-08-20 NOTE — Progress Notes (Signed)
Physical Therapy Treatment Patient Details Name: Gerald Rosario MRN: [id number] DOB: 27-Jul-1945 Today's Date: 08/20/2013    History of Present Illness Pt is a 68 y/o male admitted s/p syncope and fall at home, in which he sustained L sided rib fractures and a laceration to his upper thoracic area.    PT Comments    Pt progressing slowly towards physical therapy goals, with pain being the main limiting factor at this time. Pt reports being comfortable and asymptomatic sitting in the chair at end of session. Will continue to progress per POC.   Follow Up Recommendations  No PT follow up     Equipment Recommendations   (TBD by progress with mobility)    Recommendations for Other Services       Precautions / Restrictions Precautions Precautions: Fall Precaution Comments: L rib fractures, mid-line upper thoracic laceration Restrictions Weight Bearing Restrictions: No    Mobility  Bed Mobility Overal bed mobility: Needs Assistance Bed Mobility: Supine to Sit     Supine to sit: Supervision;HOB elevated     General bed mobility comments: Increased time to transition to full sitting. No physical assist required.  Transfers Overall transfer level: Needs assistance Equipment used: 1 person hand held assist Transfers: Sit to/from [facility] Sit to Stand: Min assist Stand pivot transfers: Min guard       General transfer comment: 2 attempts to reach full standing position from bed in lowest position. Steadying assist on second attempt with min assist to power-up. Pt was able to pivot around to recliner with close guard.   Ambulation/Gait                 Stairs            Wheelchair Mobility    Modified Rankin (Stroke Patients Only)       Balance Overall balance assessment: Needs assistance Sitting-balance support: Feet supported;No upper extremity supported Sitting balance-Leahy Scale: Fair     Standing balance support: Single  extremity supported Standing balance-Leahy Scale: Fair                      Cognition Arousal/Alertness: Awake/alert Behavior During Therapy: WFL for tasks assessed/performed Overall Cognitive Status: Within Functional Limits for tasks assessed                      Exercises General Exercises - Lower Extremity Ankle Circles/Pumps: 15 reps Long Arc Quad: 10 reps    General Comments General comments (skin integrity, edema, etc.): Orthostatic hypotension with transfer to recliner. SBP decreased to high 80's, and then after 2 minute rest break in chair increased to mid to high 90's.      Pertinent Vitals/Pain See above for BP note.    Home Living                      Prior Function            PT Goals (current goals can now be found in the care plan section) Acute Rehab PT Goals Patient Stated Goal: to get out of hospital PT Goal Formulation: With patient Time For Goal Achievement: 08/25/13 Potential to Achieve Goals: Good Progress towards PT goals: Progressing toward goals    Frequency  Min 3X/week    PT Plan Current plan remains appropriate    Co-evaluation             End of Session Equipment Utilized During Treatment:  Oxygen (Did not use gait belt due to rib fx's) Activity Tolerance: Patient limited by pain Patient left: in chair;with chair alarm set;with call bell/phone within reach     Time: 1147-1209 PT Time Calculation (min): 22 min  Charges:  $Therapeutic Activity: 8-22 mins                    G Codes:      Jolyn Lent 09-05-2013, 2:04 PM  Jolyn Lent, PT, DPT Acute Rehabilitation Services Pager: [phone]

## 2013-08-20 NOTE — H&P (View-Only) (Signed)
Patient Name: Gerald Rosario      SUBJECTIVE:recuurrent syncope without warning Rib fractures Sob and diaphoretic this am No recent cp or sob Does have intermittent orthostatic dizziness    Past Medical History  Diagnosis Date  . Hypertension   . Asthma     start dulera 100 April 12,2011 > better but "knot in throat" so try qvar June 7,2011 > preferred dulera. HFA 90% May 10,2011 > 90% October 17,2011. PFT's June 7,2011 wnl x minimal nonspecific mid flow reduction while on dulera. Changed to advair intermediate strength October 17,2011 due to ins issue  . Ischemic cardiomyopathy     Repeat Cardiac MRI - EF 52%, distal Septal & apical Akinesis (suggest scar), unable to assess viability due to patient movement  . Hoarseness 12-20-11    onset 11/09. neg w/u 09/2008. saw Dr. Raelene Bott. 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 presyncope   . Enlarged prostate 12-20-11    hx. -has Foley cath at present for retention  . Dyspnea   . Pulmonary nodule   . Hyperlipidemia   . CAD (coronary artery disease) July 2012    Severe three-vessel disease - with occluded LAD and circumflex with 80% diagonal disease. Moderate disease in the ramus with diffusely  . H/O hyperkalemia   . Hearing loss   . Vocal cord dysfunction   . Enlarged prostate   . 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  . CHF (congestive heart failure) 12-20-11    Most recent echo 03/2012: EF improved to 55-60% with apical hypokinesis. Grade 2 diastolic dysfunction.  . Diabetes mellitus 12-20-11    Dabetes-many yrs-Lantus since 1 yr.  . Chronic kidney disease 12-20-11    past hx. with elevated Potassium-tx. meds, has no renal  MD, pt. states is improved.  . NSTEMI (non-ST elevated myocardial infarction)     Scheduled Meds:  Scheduled Meds: . aspirin EC  81 mg Oral Daily  . atorvastatin  40 mg Oral q1800  . bacitracin   Topical BID  . insulin aspart   0-9 Units Subcutaneous TID WC  . insulin glargine  10 Units Subcutaneous QHS  . methocarbamol  1,500 mg Oral QID  . metoprolol tartrate  37.5 mg Oral BID  . mometasone-formoterol  2 puff Inhalation BID  . pantoprazole  40 mg Oral Daily  . sodium chloride  3 mL Intravenous Q12H  . Tdap  0.5 mL Intramuscular Once  . traMADol  100 mg Oral 4 times per day   Continuous Infusions:   PHYSICAL EXAM Filed Vitals:   08/20/13 0400 08/20/13 0500 08/20/13 0750 08/20/13 0838  BP: 132/69  131/63   Pulse: 73  73   Temp: 99 F (37.2 C)  98.5 F (36.9 C)   TempSrc: Oral  Oral   Resp: 17  14   Height:      Weight:  185 lb 3.2 oz (84.006 kg)    SpO2: 99%  98% 98%   Well developed and nourished in mode distress HENT normal Neck supple with JVP-flat Crackles bibasilar Regular rate and rhythm, no murmurs or gallops Abd-soft with active BS No Clubbing cyanosis edema Skin-warm and diaphoretic A & Oriented  Grossly normal sensory and motor function   TELEMETRY: Reviewed telemetry pt in nsr :    Intake/Output Summary (Last 24 hours) at 08/20/13 0847 Last data filed at 08/20/13 0500  Gross per 24 hour  Intake    180 ml  Output   1700 ml  Net  -1520 ml    LABS: Basic Metabolic Panel:  Recent Labs Lab 08/18/13 0643 08/18/13 0658 08/18/13 1345 08/19/13 0059 08/20/13 0433  NA 138 139  --  136* 130*  K 4.9 4.8  --  4.5 4.7  CL 100 105  --  102 97  CO2 22  --   --  22 21  GLUCOSE 184* 178*  --  88 151*  BUN 22 21  --  19 16  CREATININE 1.48* 1.60*  1.70*  --  1.18 1.03  CALCIUM 9.3  --   --  8.3* 8.5  MG  --   --  2.0  --   --    Cardiac Enzymes:  Recent Labs  08/18/13 1345  08/19/13 1123 08/19/13 1652 08/19/13 2247 08/20/13 0433  CKTOTAL 537*  --   --   --   --  352*  CKMB 7.0*  --   --   --   --   --   TROPONINI 0.32*  < > 0.79* 0.70* 0.59*  --   < > = values in this interval not displayed. CBC:  Recent Labs Lab 08/18/13 0643 08/18/13 0658 08/19/13 0059  WBC  12.9*  --  9.2  NEUTROABS 11.3*  --   --   HGB 11.9* 13.6 11.0*  HCT 36.8* 40.0 34.4*  MCV 86.2  --  88.9  PLT 279  --  232   PROTIME:  Recent Labs  08/18/13 1345  LABPROT 14.1  INR 1.11   Liver Function Tests:  Recent Labs  08/18/13 1345  AST 32  ALT 17  ALKPHOS 50  BILITOT 0.8  PROT 7.1  ALBUMIN 3.5   No results found for this basename: LIPASE, AMYLASE,  in the last 72 hours BNP: BNP (last 3 results) No results found for this basename: PROBNP,  in the last 8760 hours D-Dimer: No results found for this basename: DDIMER,  in the last 72 hours Hemoglobin A1C:  Recent Labs  08/18/13 1345  HGBA1C 7.4*   Fasting Lipid Panel:  Recent Labs  08/20/13 0433  CHOL 165  HDL 63  LDLCALC 79  TRIG 113  CHOLHDL 2.6   Thyroid Function Tests: No results found for this basename: TSH, T4TOTAL, FREET3, T3FREE, THYROIDAB,  in the last 72 hours Anemia Panel:   ASSESSMENT AND PLAN:  Principal Problem:   Syncope Active Problems:   HYPERTENSION   Coronary artery disease   Ischemic cardiomyopathy    Multiple fractures of ribs of left side   Laceration of skin of scalp   Hematoma of occipital surface of head   Laceration of skin   NSTEMI (non-ST elevated myocardial infarction)  Agree with inhouse myoview for risk stratificationn  Would consider LINQ for recurrent syncope prior to discharge   Signed, Deboraha Sprang MD  08/20/2013

## 2013-08-20 NOTE — Care Management Note (Addendum)
    Page 1 of 2   09/09/2013     2:14:49 PM CARE MANAGEMENT NOTE 09/09/2013  Patient:  EVERSON, MOTT   Account Number:  [id number]  Date Initiated:  08/20/2013  Documentation initiated by:  MAYO,HENRIETTA  Subjective/Objective Assessment:   dx syncope/NSTEMI; lives alone    PCP  [facility]     Action/Plan:   Anticipated DC Date:  09/04/2013   Anticipated DC Plan:  SKILLED NURSING FACILITY  In-house referral  Clinical Social Worker      DC Planning Services  CM consult      Choice offered to / List presented to:  C-1 Patient           Status of service:  Completed, signed off Medicare Important Message given?  YES (If response is "NO", the following Medicare IM given date fields will be blank) Date Medicare IM given:  08/25/2013 Date Additional Medicare IM given:  09/08/2013  Discharge Disposition:  [place]  Per UR Regulation:  Reviewed for med. necessity/level of care/duration of stay  If discussed at [place] of Stay Meetings, dates discussed:   08/28/2013  09/02/2013    Comments:  09/08/13 Ellan Lambert, RN, BSN [phone] Pt discharged to SNF today, per CSW arrangements.  09/05/2013 1530 Additional Medicare IM given, placed on chart. Jonnie Finner RN CCM [phone]  09/01/13 0654 Henrietta Mayo RN MSN BSN CCM S/P CABG/evac of hemothorax; amio/dopamine gtts, ileus.  PT recommending SNF for rehab, CSW notified. 1110 Talked with pt about SNF for rehab, explained medicare reimbursement.  He is agreeable, states he lives alone and needs to be stronger before he returns home.  08/26/13 0830 Henrietta Mayo RN MSN BSN CCM Pt now schedueld for cath then VATS.  Per PT, pt will need SNF for rehab, CSW following.  08/20/13 [place] RN MSN BSN CCM Pt will need home health RN to assess/monitor VS, OT recommends home therapy.  Provided list of agencies, pt states he previously received services from [facility] and requests referral to that agency.

## 2013-08-21 ENCOUNTER — Inpatient Hospital Stay (HOSPITAL_COMMUNITY): Payer: Medicare Other

## 2013-08-21 ENCOUNTER — Ambulatory Visit (HOSPITAL_COMMUNITY): Payer: Medicare Other

## 2013-08-21 ENCOUNTER — Ambulatory Visit (INDEPENDENT_AMBULATORY_CARE_PROVIDER_SITE_OTHER): Payer: Medicare Other | Admitting: *Deleted

## 2013-08-21 ENCOUNTER — Encounter (HOSPITAL_COMMUNITY): Payer: Self-pay | Admitting: *Deleted

## 2013-08-21 DIAGNOSIS — J90 Pleural effusion, not elsewhere classified: Secondary | ICD-10-CM | POA: Diagnosis present

## 2013-08-21 DIAGNOSIS — R55 Syncope and collapse: Secondary | ICD-10-CM | POA: Diagnosis not present

## 2013-08-21 DIAGNOSIS — R079 Chest pain, unspecified: Secondary | ICD-10-CM

## 2013-08-21 DIAGNOSIS — I214 Non-ST elevation (NSTEMI) myocardial infarction: Secondary | ICD-10-CM | POA: Diagnosis not present

## 2013-08-21 DIAGNOSIS — E875 Hyperkalemia: Secondary | ICD-10-CM

## 2013-08-21 DIAGNOSIS — D62 Acute posthemorrhagic anemia: Secondary | ICD-10-CM

## 2013-08-21 DIAGNOSIS — S271XXA Traumatic hemothorax, initial encounter: Secondary | ICD-10-CM

## 2013-08-21 DIAGNOSIS — N179 Acute kidney failure, unspecified: Secondary | ICD-10-CM | POA: Diagnosis not present

## 2013-08-21 LAB — CBC
HCT: 23.7 % — ABNORMAL LOW (ref 39.0–52.0)
HCT: 21.9 % — ABNORMAL LOW (ref 39.0–52.0)
Hemoglobin: 7.8 g/dL — ABNORMAL LOW (ref 13.0–17.0)
Hemoglobin: 7.1 g/dL — ABNORMAL LOW (ref 13.0–17.0)
MCH: 28.8 pg (ref 26.0–34.0)
MCH: 28.2 pg (ref 26.0–34.0)
MCHC: 32.9 g/dL (ref 30.0–36.0)
MCHC: 32.4 g/dL (ref 30.0–36.0)
MCV: 87.5 fL (ref 78.0–100.0)
MCV: 86.9 fL (ref 78.0–100.0)
Platelets: 217 10*3/uL (ref 150–400)
Platelets: 208 10*3/uL (ref 150–400)
RBC: 2.71 MIL/uL — ABNORMAL LOW (ref 4.22–5.81)
RBC: 2.52 MIL/uL — ABNORMAL LOW (ref 4.22–5.81)
RDW: 14.5 % (ref 11.5–15.5)
RDW: 14.5 % (ref 11.5–15.5)
WBC: 11.7 10*3/uL — ABNORMAL HIGH (ref 4.0–10.5)
WBC: 10.7 10*3/uL — ABNORMAL HIGH (ref 4.0–10.5)

## 2013-08-21 LAB — BASIC METABOLIC PANEL
BUN: 26 mg/dL — ABNORMAL HIGH (ref 6–23)
BUN: 26 mg/dL — ABNORMAL HIGH (ref 6–23)
BUN: 26 mg/dL — ABNORMAL HIGH (ref 6–23)
CO2: 22 mEq/L (ref 19–32)
CO2: 21 mEq/L (ref 19–32)
CO2: 20 mEq/L (ref 19–32)
Calcium: 8.3 mg/dL — ABNORMAL LOW (ref 8.4–10.5)
Calcium: 8.2 mg/dL — ABNORMAL LOW (ref 8.4–10.5)
Calcium: 8.5 mg/dL (ref 8.4–10.5)
Chloride: 98 mEq/L (ref 96–112)
Chloride: 99 mEq/L (ref 96–112)
Chloride: 98 mEq/L (ref 96–112)
Creatinine, Ser: 1.25 mg/dL (ref 0.50–1.35)
Creatinine, Ser: 1.22 mg/dL (ref 0.50–1.35)
Creatinine, Ser: 1.13 mg/dL (ref 0.50–1.35)
GFR calc Af Amer: 67 mL/min — ABNORMAL LOW (ref >90)
GFR calc Af Amer: 69 mL/min — ABNORMAL LOW (ref >90)
GFR calc Af Amer: 75 mL/min — ABNORMAL LOW (ref >90)
GFR calc non Af Amer: 57 mL/min — ABNORMAL LOW (ref >90)
GFR calc non Af Amer: 59 mL/min — ABNORMAL LOW (ref >90)
GFR calc non Af Amer: 65 mL/min — ABNORMAL LOW (ref >90)
Glucose, Bld: 175 mg/dL — ABNORMAL HIGH (ref 70–99)
Glucose, Bld: 183 mg/dL — ABNORMAL HIGH (ref 70–99)
Glucose, Bld: 221 mg/dL — ABNORMAL HIGH (ref 70–99)
Potassium: 5.7 mEq/L — ABNORMAL HIGH (ref 3.7–5.3)
Potassium: 5.5 mEq/L — ABNORMAL HIGH (ref 3.7–5.3)
Potassium: 5.1 mEq/L (ref 3.7–5.3)
Sodium: 131 mEq/L — ABNORMAL LOW (ref 137–147)
Sodium: 132 mEq/L — ABNORMAL LOW (ref 137–147)
Sodium: 129 mEq/L — ABNORMAL LOW (ref 137–147)

## 2013-08-21 LAB — CBC WITH DIFFERENTIAL/PLATELET
Basophils Absolute: 0 10*3/uL (ref 0.0–0.1)
Basophils Relative: 0 % (ref 0–1)
Eosinophils Absolute: 0 10*3/uL (ref 0.0–0.7)
Eosinophils Relative: 0 % (ref 0–5)
HCT: 24.2 % — ABNORMAL LOW (ref 39.0–52.0)
Hemoglobin: 7.8 g/dL — ABNORMAL LOW (ref 13.0–17.0)
Lymphocytes Relative: 9 % — ABNORMAL LOW (ref 12–46)
Lymphs Abs: 0.9 10*3/uL (ref 0.7–4.0)
MCH: 28.2 pg (ref 26.0–34.0)
MCHC: 32.2 g/dL (ref 30.0–36.0)
MCV: 87.4 fL (ref 78.0–100.0)
Monocytes Absolute: 0.9 10*3/uL (ref 0.1–1.0)
Monocytes Relative: 9 % (ref 3–12)
Neutro Abs: 8.6 10*3/uL — ABNORMAL HIGH (ref 1.7–7.7)
Neutrophils Relative %: 82 % — ABNORMAL HIGH (ref 43–77)
Platelets: 215 10*3/uL (ref 150–400)
RBC: 2.77 MIL/uL — ABNORMAL LOW (ref 4.22–5.81)
RDW: 14.4 % (ref 11.5–15.5)
WBC: 10.5 10*3/uL (ref 4.0–10.5)

## 2013-08-21 LAB — GLUCOSE, CAPILLARY
Glucose-Capillary: 167 mg/dL — ABNORMAL HIGH (ref 70–99)
Glucose-Capillary: 151 mg/dL — ABNORMAL HIGH (ref 70–99)
Glucose-Capillary: 177 mg/dL — ABNORMAL HIGH (ref 70–99)
Glucose-Capillary: 187 mg/dL — ABNORMAL HIGH (ref 70–99)

## 2013-08-21 LAB — PREPARE RBC (CROSSMATCH)

## 2013-08-21 LAB — TROPONIN I: Troponin I: <0.3 ng/mL (ref <0.30)

## 2013-08-21 MED ORDER — SODIUM CHLORIDE 0.9 % IV BOLUS (SEPSIS)
250.0000 mL | Freq: Once | INTRAVENOUS | Status: AC
  Administered 2013-08-21: 250 mL via INTRAVENOUS

## 2013-08-21 MED ORDER — SODIUM POLYSTYRENE SULFONATE 15 GM/60ML PO SUSP
15.0000 g | Freq: Once | ORAL | Status: AC
  Administered 2013-08-21: 15 g via ORAL
  Filled 2013-08-21: qty 60

## 2013-08-21 MED ORDER — FENTANYL CITRATE 0.05 MG/ML IJ SOLN
INTRAMUSCULAR | Status: AC
  Administered 2013-08-21: 50 ug
  Filled 2013-08-21: qty 2

## 2013-08-21 MED ORDER — MIDAZOLAM HCL 2 MG/2ML IJ SOLN
INTRAMUSCULAR | Status: AC
  Administered 2013-08-21: 2 mg
  Filled 2013-08-21: qty 4

## 2013-08-21 NOTE — Progress Notes (Signed)
OT Cancellation Note  Patient Details Name: Gerald Rosario MRN: [id number] DOB: 01-Feb-1946   Cancelled Treatment:    Reason Eval/Treat Not Completed: Patient at procedure or test/ unavailable (chest tube placed placement) Pt sedated for procedure per notes. OT to reattempt on Friday 08/22/13   Peri Maris Pager: [phone]  08/21/2013, 3:44 PM

## 2013-08-21 NOTE — Progress Notes (Signed)
    Day 3 of stay      Patient name: Gerald Rosario  Medical record number: [id number]  Date of birth: January 05, 1946   Worse breathing, increasing fatigue this morning but saturations 98 on 2l. Feels uncomfortable. CXR reveals pleural effusion increased on left side. Thoracentesis reveals large hemothorax 1100cc evacuated.   Recent Labs Lab 08/20/13 1409 08/21/13 0329 08/21/13 0815  HGB 9.6* 7.8* 7.8*  HCT 29.4* 24.2* 23.7*  WBC 11.8* 10.5 11.7*  PLT 236 215 217    Recent Labs Lab 08/18/13 0643 08/18/13 0658 08/18/13 1345 08/19/13 0059 08/20/13 0433 08/21/13 0329 08/21/13 0815  NA 138 139  --  136* 130* 131* 132*  K 4.9 4.8  --  4.5 4.7 5.7* 5.5*  CL 100 105  --  102 97 98 99  CO2 22  --   --  22 21 22 21   GLUCOSE 184* 178*  --  88 151* 175* 183*  BUN 22 21  --  19 16 26* 26*  CREATININE 1.48* 1.60*  1.70*  --  1.18 1.03 1.25 1.22  CALCIUM 9.3  --   --  8.3* 8.5 8.3* 8.2*  MG  --   --  2.0  --   --   --   --      Assessment and Plan     Symptomatic anemia - Trend HgB. Transfuse PRBC. Give kayexalate before PRBC.  Hyperkalemia - Follow K - trend BMP  Adequate pain control.   No more syncopal episodes. Loop recorder inserted by EP. Myoview scheduled for today.     I have discussed the care of this patient with my IM team residents. Please see the resident note for details.  Francia Verry 08/21/2013, 2:30 PM.

## 2013-08-21 NOTE — Progress Notes (Signed)
Subjective:   VSS.  Pt had implantable loop recorder inserted last PM.  Myoview planned for Friday per cards.  Potassium increased to 5.7 and hbg dropped to 7.8 this AM.  Given kayexalate x 1.  EKG without acute changes.  Troponin trended back down this AM to wnl.  However, hgb has trended down to 7.8.  CXR showed large left pleural effusion. Pt still c/o left sided pain but appears somewhat short of breath and having difficulty speaking in full sentences.      Objective:   Vital signs in last 24 hours: Filed Vitals:   08/21/13 0737 08/21/13 0910 08/21/13 1126 08/21/13 1154  BP: 114/53 107/53 122/50   Pulse: 85 85 80   Temp: 98.1 F (36.7 C)  98 F (36.7 C)   TempSrc: Oral  Oral   Resp: 17  23   Height:      Weight:      SpO2: 98%  100% 96%    Weight: Filed Weights   08/19/13 0400 08/20/13 0500 08/21/13 0400  Weight: 185 lb 3 oz (84 kg) 185 lb 3.2 oz (84.006 kg) 184 lb 15.5 oz (83.9 kg)    Ins/Outs:  Intake/Output Summary (Last 24 hours) at 08/21/13 1454 Last data filed at 08/21/13 0907  Gross per 24 hour  Intake 1401.33 ml  Output    600 ml  Net 801.33 ml    Physical Exam: Constitutional: Vital signs reviewed.  Patient is sitting up in bed appearing in distress and  c/o left-sided chest pain.    HEENT: [place]/AT; PERRL, EOMI, conjunctivae normal, no scleral icterus  Cardiovascular: RRR, no MRG Pulmonary/Chest: increased respiratory effort, no accessory muscle use, decreased breath sounds on the left without rales, wheezes, or rhonchi Abdominal: Soft. +BS, NT/ND Neurological: A&O x3, CN II-XII grossly intact; non-focal exam Musculoskeletal: 15cm laceration R upper back at approximately the T3 level.   Abrasions noted left mid thoracic and back region with TTP. No midline spinal tenderness noted.  Extremities: 2+DP b/l, no C/C/E  Skin: Warm, dry.    Lab Results:  BMP:  Recent Labs Lab 08/18/13 0658 08/18/13 1345  08/21/13 0329 08/21/13 0815  NA 139  --   < >  131* 132*  K 4.8  --   < > 5.7* 5.5*  CL 105  --   < > 98 99  CO2  --   --   < > 22 21  GLUCOSE 178*  --   < > 175* 183*  BUN 21  --   < > 26* 26*  CREATININE 1.60*  1.70*  --   < > 1.25 1.22  CALCIUM  --   --   < > 8.3* 8.2*  MG  --  2.0  --   --   --   < > = values in this interval not displayed. Anion Gap:  12  CBC:  Recent Labs Lab 08/18/13 0643  08/21/13 0329 08/21/13 0815  WBC 12.9*  < > 10.5 11.7*  NEUTROABS 11.3*  --  8.6*  --   HGB 11.9*  < > 7.8* 7.8*  HCT 36.8*  < > 24.2* 23.7*  MCV 86.2  < > 87.4 87.5  PLT 279  < > 215 217  < > = values in this interval not displayed.  Coagulation:  Recent Labs Lab 08/18/13 1345  LABPROT 14.1  INR 1.11    CBG:            Recent Labs Lab  08/20/13 0903 08/20/13 1227 08/20/13 1602 08/20/13 2133 08/21/13 0736 08/21/13 1201  GLUCAP 197* 197* 212* 176* 167* 151*           HA1C:       Recent Labs Lab 08/18/13 1345  HGBA1C 7.4*    Lipid Panel:  Recent Labs Lab 08/20/13 0433  CHOL 165  HDL 63  LDLCALC 79  TRIG 113  CHOLHDL 2.6    LFTs:  Recent Labs Lab 08/18/13 1345  AST 32  ALT 17  ALKPHOS 50  BILITOT 0.8  PROT 7.1  ALBUMIN 3.5    Pancreatic Enzymes: No results found for this basename: LIPASE, AMYLASE,  in the last 168 hours  Lactic Acid/Procalcitonin:  Recent Labs Lab 08/18/13 0643 08/18/13 1345  LATICACIDVEN 2.4* 2.0    Ammonia: No results found for this basename: AMMONIA,  in the last 168 hours  Cardiac Enzymes:  Recent Labs Lab 08/18/13 1345  08/19/13 2247 08/20/13 0433 08/20/13 1010 08/21/13 0259  CKTOTAL 537*  --   --  352*  --   --   CKMB 7.0*  --   --   --   --   --   TROPONINI 0.32*  < > 0.59*  --  0.36* <0.30  < > = values in this interval not displayed.  EKG: EKG Interpretation  Date/Time:  Monday Aug 18 2013 04:59:50 EDT Ventricular Rate:  78 PR Interval:  169 QRS Duration: 107 QT Interval:  394 QTC Calculation: 449 R Axis:   6 Text  Interpretation:  Sinus rhythm Anterior infarct, old ED PHYSICIAN INTERPRETATION AVAILABLE IN [facility] [place] Confirmed by TEST, Record ([id number]) on 08/20/2013 7:15:39 AM   BNP: No results found for this basename: PROBNP,  in the last 168 hours  D-Dimer: No results found for this basename: DDIMER,  in the last 168 hours  Urinalysis:  Recent Labs Lab 08/18/13 1200  COLORURINE YELLOW  LABSPEC 1.014  PHURINE 5.0  GLUCOSEU NEGATIVE  HGBUR NEGATIVE  BILIRUBINUR NEGATIVE  KETONESUR 15*  PROTEINUR NEGATIVE  UROBILINOGEN 0.2  NITRITE NEGATIVE  LEUKOCYTESUR TRACE*    Micro Results: Recent Results (from the past 240 hour(s))  MRSA PCR SCREENING     Status: None   Collection Time    08/18/13  4:00 PM      Result Value Ref Range Status   MRSA by PCR NEGATIVE  NEGATIVE Final   Comment:            The GeneXpert MRSA Assay (FDA     approved for NASAL specimens     only), is one component of a     comprehensive MRSA colonization     surveillance program. It is not     intended to diagnose MRSA     infection nor to guide or     monitor treatment for     MRSA infections.    Blood Culture: No results found for this basename: sdes,  specrequest,  cult,  reptstatus    Studies/Results: Dg Chest 2 View  08/21/2013   CLINICAL DATA:  Multiple left rib fractures secondary to a fall.  EXAM: CHEST  2 VIEW  COMPARISON:  08/20/2013  FINDINGS: There is a new large left pleural effusion or hemothorax. Multiple left rib fractures are again noted. There is compressive atelectasis of the left upper and lower lobes.  Right lung is clear.  IMPRESSION: New large left pleural effusion or hemothorax with compressive atelectasis in the left upper and lower lobes.  Critical Value/emergent  results were called by telephone at the time of interpretation on 08/21/2013 at 7:58 AM to the patient's nurse, Dara, RN , who verbally acknowledged these results.   Electronically Signed   By: Rozetta Nunnery M.D.   On:  08/21/2013 07:57   Dg Chest 2 View  08/20/2013   CLINICAL DATA:  Recent rib fractures and pneumothorax, followup  EXAM: CHEST  2 VIEW  COMPARISON:  DG CHEST 1V PORT dated 08/19/2013  FINDINGS: Lungs are hypoaerated with crowding of the bronchovascular markings. Heart size at upper limits of normal. Improved aeration is noted at the lung bases. No pneumothorax or pneumomediastinum identified. Nondisplaced left lateral rib fractures are reidentified.  IMPRESSION: Improved aeration with persistent left lower lobe opacity which could represent atelectasis or contusion in the setting of multiple left-sided rib fractures.  No pneumothorax or pneumomediastinum.   Electronically Signed   By: Conchita Paris M.D.   On: 08/20/2013 09:42   Dg Chest Port 1 View  08/21/2013   CLINICAL DATA:  Chest tube placement.  EXAM: PORTABLE CHEST - 1 VIEW  COMPARISON:  08/21/2013 at 6:21 a.m.  FINDINGS: Interval placement of a left-sided chest tube with tip over the upper thorax in adequate position. Lungs are somewhat hypoinflated with mild interval improvement in a moderate to large left pleural fluid collection likely with associated compressive atelectasis. No definite pneumothorax. Remainder of the exam is unchanged to include several acute left posterior and lateral rib fractures.  IMPRESSION: Minimal improvement in patient's moderate to large left pleural fluid collection likely with associated compressive atelectasis post left chest tube placement. No definite pneumothorax seen.  Known multiple acute left rib fractures.   Electronically Signed   By: Marin Olp M.D.   On: 08/21/2013 13:16    Medications:  Scheduled Meds: . aspirin EC  81 mg Oral Daily  . atorvastatin  40 mg Oral q1800  . bacitracin   Topical BID  . insulin aspart  0-9 Units Subcutaneous TID WC  . insulin glargine  10 Units Subcutaneous QHS  . methocarbamol  1,500 mg Oral QID  . metoprolol tartrate  12.5 mg Oral BID  . mometasone-formoterol  2 puff  Inhalation BID  . pantoprazole  40 mg Oral Daily  . [START ON 08/22/2013] regadenoson  0.4 mg Intravenous Once  . sodium chloride  3 mL Intravenous Q12H  . sodium polystyrene  15 g Oral Once  . Tdap  0.5 mL Intramuscular Once  . traMADol  100 mg Oral 4 times per day   Continuous Infusions:  PRN Meds: acetaminophen, calcium carbonate, ondansetron (ZOFRAN) IV, oxyCODONE  Antibiotics: Antibiotics Given (last 72 hours)   None      Day of Hospitalization: 3  Consults: Treatment Team:  Trauma Md, MD Rounding Lbcardiology, MD  Assessment/Plan:   Principal Problem:   Syncope Active Problems:   HYPERTENSION   Coronary artery disease   Ischemic cardiomyopathy   Diabetes mellitus   Hyperkalemia   Chronic systolic heart failure   Multiple fractures of ribs of left side   Laceration of skin of scalp   Hematoma of occipital surface of head   Chronic sinusitis   Laceration of skin   NSTEMI (non-ST elevated myocardial infarction)   Pleural effusion   Urine retention   Syncope and collapse   Pleural effusion, left  Acute left-sided chest pain Still c/o left sided chest pain this AM and is short of breath.  No N/V.  Troponin trended down to normal.  Hgb continues  to fall and is 7.8 today.  CXR today revealed large pleural effusion--surgery did thoracentesis today revealed hemothorax with >1000cc.   -will repeat cbc now -transfuse 1 unit of prbcs  Multiple segmental rib fractures at risk for flail chest Today, CXR showed large left pleural effusion--thoracentesis with hemothorax and >1000cc. -pain management -pulmonary toilet/IS  Hyperkalemia No EKG changes.    -kayexalate x 2 -repeat BMP  Syncope  Stable.  Trops trended down.  EP consulted and placed implantable loop recorder yesterday.  5/19 echo: LVEF 55-60% with LA dilation but unchanged from 2013 echo.   -appreciate EP recs  NSTEMI Stable.  Trops trended down.  Lipid panel OK.   -increased atorvastatin to 40mg   qd  Upper back skin laceration 15 cm laceration at risk for skin necrosis.  Trauma consulted in ED and laceration repaired by trama service. -monitor  Left suboccipital scalp hematoma and laceration -monitor  AKI Resolved.  Baseline is 1.1-1.2.  He was noted to have mild elevated Cr at 1.7 on admission. CK trending down.  -hold lasix -avoid nephrotoxic meds -BMP in am  Leukocytosis Resolved. Likely stress response.  h/o hypertension  BP stable.  On home lasix, imdur and metoprolol.  -will hold lasix and imdur given AKI and syncope workup. -continue metoprolol at lower dose given hypotension   DMII Lab Results  Component Value Date   HGBA1C 7.4* 08/18/2013  -hold home glipizide -Lantus 10 units QHS -SSI  Chronic sinusitis This is an incidental finding on CT head.   -follow-up as outpatient    Mild hyponatremia -monitor  VTE SCDs  Dispo: Disposition is deferred at this time, awaiting improvement of current medical problems. Anticipated discharge in approximately 2-3 day(s).   The patient does have a current PCP Charolotte Eke Powers) and does need an [facility] hospital follow-up appointment after discharge.   Signed: Jones Bales, MD 08/21/2013, 2:54 PM

## 2013-08-21 NOTE — Progress Notes (Signed)
Large left hemothorax.  Needs chest tube.  Will go ahead and place tube with adequate sedation.  Consent has been signed.ibuprofen have spoken with the unit director who feels comfortable that they can care for this patient through the chest tube placement and subsequently.  This patient has been seen and I agree with the findings and treatment plan.  Kathryne Eriksson. Dahlia Bailiff, MD, [place] [phone] (pager) [phone] (direct pager) Trauma Surgeon

## 2013-08-21 NOTE — Progress Notes (Signed)
Patient Name: Gerald Rosario Gerald Rosario      SUBJECTIVE: feels a little better Past Medical History  Diagnosis Date  . Hypertension   . Asthma     start dulera 100 April 12,2011 > better but "knot in throat" so try qvar June 7,2011 > preferred dulera. HFA 90% May 10,2011 > 90% October 17,2011. PFT's June 7,2011 wnl x minimal nonspecific mid flow reduction while on dulera. Changed to advair intermediate strength October 17,2011 due to ins issue  . Ischemic cardiomyopathy     Repeat Cardiac MRI - EF 52%, distal Septal & apical Akinesis (suggest scar), unable to assess viability due to patient movement  . Hoarseness 12-20-11    onset 11/09. neg w/u 09/2008. saw Dr. Raelene Bott. 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 presyncope   . Enlarged prostate 12-20-11    hx. -has Foley cath at present for retention  . Dyspnea   . Pulmonary nodule   . Hyperlipidemia   . CAD (coronary artery disease) July 2012    Severe three-vessel disease - with occluded LAD and circumflex with 80% diagonal disease. Moderate disease in the ramus with diffusely  . H/O hyperkalemia   . Hearing loss   . Vocal cord dysfunction   . Enlarged prostate   . 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  . CHF (congestive heart failure) 12-20-11    Most recent echo 03/2012: EF improved to 55-60% with apical hypokinesis. Grade 2 diastolic dysfunction.  . Diabetes mellitus 12-20-11    Dabetes-many yrs-Lantus since 1 yr.  . Chronic kidney disease 12-20-11    past hx. with elevated Potassium-tx. meds, has no renal  MD, pt. states is improved.  . NSTEMI (non-ST elevated myocardial infarction)     Scheduled Meds:  Scheduled Meds: . aspirin EC  81 mg Oral Daily  . atorvastatin  40 mg Oral q1800  . bacitracin   Topical BID  . insulin aspart  0-9 Units Subcutaneous TID WC  . insulin glargine  10 Units Subcutaneous QHS  . methocarbamol  1,500 mg Oral QID  .  metoprolol tartrate  12.5 mg Oral BID  . mometasone-formoterol  2 puff Inhalation BID  . pantoprazole  40 mg Oral Daily  . [START ON 08/22/2013] regadenoson  0.4 mg Intravenous Once  . sodium chloride  3 mL Intravenous Q12H  . Tdap  0.5 mL Intramuscular Once  . traMADol  100 mg Oral 4 times per day   Continuous Infusions:   PHYSICAL EXAM Filed Vitals:   08/20/13 2152 08/20/13 2346 08/21/13 0400 08/21/13 0737  BP: 102/53 131/50 117/54 114/53  Pulse: 86 87 83 85  Temp:  98.3 F (36.8 C) 97.9 F (36.6 C) 98.1 F (36.7 C)  TempSrc:  Oral Oral Oral  Resp:  15 10 17   Height:      Weight:   184 lb 15.5 oz (83.9 kg)   SpO2:  99% 100% 98%    Well developed and nourished in no acute distress HENT normal Neck supple with JVP-flat Clear Regular rate and rhythm, no murmurs or gallops Abd-soft with active BS No Clubbing cyanosis edema Skin-warm and dry A & Oriented  Grossly normal sensory and motor function   TELEMETRY: Reviewed telemetry pt in  nsr     Intake/Output Summary (Last 24 hours) at 08/21/13 0812 Last data filed at 08/21/13 0600  Gross per 24 hour  Intake 1638.33 ml  Output    975 ml  Net 663.33 ml    LABS: Basic Metabolic Panel:  Recent Labs Lab 08/18/13 0643 08/18/13 0658 08/18/13 1345 08/19/13 0059 08/20/13 0433 08/21/13 0329  NA 138 139  --  136* 130* 131*  K 4.9 4.8  --  4.5 4.7 5.7*  CL 100 105  --  102 97 98  CO2 22  --   --  22 21 22   GLUCOSE 184* 178*  --  88 151* 175*  BUN 22 21  --  19 16 26*  CREATININE 1.48* 1.60*  1.70*  --  1.18 1.03 1.25  CALCIUM 9.3  --   --  8.3* 8.5 8.3*  MG  --   --  2.0  --   --   --    Cardiac Enzymes:  Recent Labs  08/18/13 1345  08/19/13 2247 08/20/13 0433 08/20/13 1010 08/21/13 0259  CKTOTAL 537*  --   --  352*  --   --   CKMB 7.0*  --   --   --   --   --   TROPONINI 0.32*  < > 0.59*  --  0.36* <0.30  < > = values in this interval not displayed. CBC:  Recent Labs Lab 08/18/13 0643  08/18/13 0658 08/19/13 0059 08/20/13 1010 08/20/13 1409 08/21/13 0329  WBC 12.9*  --  9.2 13.3* 11.8* 10.5  NEUTROABS 11.3*  --   --   --   --  8.6*  HGB 11.9* 13.6 11.0* 9.7* 9.6* 7.8*  HCT 36.8* 40.0 34.4* 30.0* 29.4* 24.2*  MCV 86.2  --  88.9 88.2 88.3 87.4  PLT 279  --  232 222 236 215   PROTIME:  Recent Labs  08/18/13 1345  LABPROT 14.1  INR 1.11   Liver Function Tests:  Recent Labs  08/18/13 1345  AST 32  ALT 17  ALKPHOS 50  BILITOT 0.8  PROT 7.1  ALBUMIN 3.5   No results found for this basename: LIPASE, AMYLASE,  in the last 72 hours BNP: BNP (last 3 results) No results found for this basename: PROBNP,  in the last 8760 hours D-Dimer: No results found for this basename: DDIMER,  in the last 72 hours Hemoglobin A1C:  Recent Labs  08/18/13 1345  HGBA1C 7.4*   Fasting Lipid Panel:  Recent Labs  08/20/13 0433  CHOL 165  HDL 63  LDLCALC 79  TRIG 113  CHOLHDL 2.6    ASSESSMENT AND PLAN:  Principal Problem:   Syncope Active Problems:   HYPERTENSION   Coronary artery disease   Ischemic cardiomyopathy   Diabetes mellitus   Chronic systolic heart failure   Multiple fractures of ribs of left side   Laceration of skin of scalp   Hematoma of occipital surface of head   Chronic sinusitis   Laceration of skin   NSTEMI (non-ST elevated myocardial infarction)   Pleural effusion   Urine retention   Syncope and collapse  Loop recorder inserted myoview scheduled Anemia  Work up per trauma atelectasis  Signed, Deboraha Sprang MD  08/21/2013

## 2013-08-21 NOTE — Progress Notes (Signed)
Subjective: Patient was little better then yesterday, still having left chest pain with any kind of movement.Still C/O anorexia not eating or drinking much. Objective: Vital signs in last 24 hours: Filed Vitals:   08/21/13 0737 08/21/13 0910 08/21/13 1126 08/21/13 1154  BP: 114/53 107/53 122/50   Pulse: 85 85 80   Temp: 98.1 F (36.7 C)  98 F (36.7 C)   TempSrc: Oral  Oral   Resp: 17  23   Height:      Weight:      SpO2: 98%  100% 96%   Weight change: -0.106 kg (-3.7 oz)  Intake/Output Summary (Last 24 hours) at 08/21/13 1311 Last data filed at 08/21/13 0907  Gross per 24 hour  Intake 1641.33 ml  Output    600 ml  Net 1041.33 ml   EXAM: GENERAL: Mildly dyspneic, alert and oriented HEENT: PERRL, EOMI NECK: Supple, no JVD, no adenopathy LUNG: Decreased breath sound at left. HEART: RRR, S1 and S@, no MRG ABD: S/ND/NT. No HSM. BS +  NEURO;A & OX 3. Moving all extremities, no gross neuro. Deficit  EXT: no edema, Hands and feet looks more pallor then rest of body, no cyanosis  Lab Results: @LABTEST2 @ Micro Results: Recent Results (from the past 240 hour(s))  MRSA PCR SCREENING     Status: None   Collection Time    08/18/13  4:00 PM      Result Value Ref Range Status   MRSA by PCR NEGATIVE  NEGATIVE Final   Comment:            The GeneXpert MRSA Assay (FDA     approved for NASAL specimens     only), is one component of a     comprehensive MRSA colonization     surveillance program. It is not     intended to diagnose MRSA     infection nor to guide or     monitor treatment for     MRSA infections.   Studies/Results: Dg Chest 2 View  08/21/2013   CLINICAL DATA:  Multiple left rib fractures secondary to a fall.  EXAM: CHEST  2 VIEW  COMPARISON:  08/20/2013  FINDINGS: There is a new large left pleural effusion or hemothorax. Multiple left rib fractures are again noted. There is compressive atelectasis of the left upper and lower lobes.  Right lung is clear.   IMPRESSION: New large left pleural effusion or hemothorax with compressive atelectasis in the left upper and lower lobes.  Critical Value/emergent results were called by telephone at the time of interpretation on 08/21/2013 at 7:58 AM to the patient's nurse, Dara, RN , who verbally acknowledged these results.   Electronically Signed   By: Rozetta Nunnery M.D.   On: 08/21/2013 07:57   Dg Chest 2 View  08/20/2013   CLINICAL DATA:  Recent rib fractures and pneumothorax, followup  EXAM: CHEST  2 VIEW  COMPARISON:  DG CHEST 1V PORT dated 08/19/2013  FINDINGS: Lungs are hypoaerated with crowding of the bronchovascular markings. Heart size at upper limits of normal. Improved aeration is noted at the lung bases. No pneumothorax or pneumomediastinum identified. Nondisplaced left lateral rib fractures are reidentified.  IMPRESSION: Improved aeration with persistent left lower lobe opacity which could represent atelectasis or contusion in the setting of multiple left-sided rib fractures.  No pneumothorax or pneumomediastinum.   Electronically Signed   By: Conchita Paris M.D.   On: 08/20/2013 09:42   Medications: medication reviewed Scheduled Meds: . aspirin  EC  81 mg Oral Daily  . atorvastatin  40 mg Oral q1800  . bacitracin   Topical BID  . insulin aspart  0-9 Units Subcutaneous TID WC  . insulin glargine  10 Units Subcutaneous QHS  . methocarbamol  1,500 mg Oral QID  . metoprolol tartrate  12.5 mg Oral BID  . mometasone-formoterol  2 puff Inhalation BID  . pantoprazole  40 mg Oral Daily  . [START ON 08/22/2013] regadenoson  0.4 mg Intravenous Once  . sodium chloride  3 mL Intravenous Q12H  . Tdap  0.5 mL Intramuscular Once  . traMADol  100 mg Oral 4 times per day   Continuous Infusions:  PRN Meds:.acetaminophen, calcium carbonate, ondansetron (ZOFRAN) IV, oxyCODONE Assessment/Plan: Principal Problem:   Syncope Active Problems:   HYPERTENSION   Coronary artery disease   Ischemic cardiomyopathy    Diabetes mellitus   Hyperkalemia   Chronic systolic heart failure   Multiple fractures of ribs of left side   Laceration of skin of scalp   Hematoma of occipital surface of head   Chronic sinusitis   Laceration of skin   NSTEMI (non-ST elevated myocardial infarction)   Pleural effusion   Urine retention   Syncope and collapse   Pleural effusion, left 1: LEFT HEMOTHORAX: Patient had a large left sided pleural effusion on today"s CXR ( new finding) Dr. Hulen Skains did thoracentesis and put a chest tube and drained more then 1100cc of bloody fluid from his left chest. Patient tolerated the procedure well, continue monitor his drainage, vitals and CBC , if there is more drop, might need a blood transfusion. We already did order for type and screen. 2: SYNCOPE: Pt. Was feeling little better this morning, although little dyspneic. No more syncopal or pre syncopal episode after yesterday morning. Dysnomia is most probably due to his large left pleural effusion . Will reassess after the chest tube in place.Continue monitoring him. 3:NSTEMI: His troponin has normalized, even troponin after yesterday episode came back normal, may be just had demand ischemia after that initial episode of syncope. Cardialogy is following, had his LINQ recorded implanted yesterday, keep him on telemetry and monitoring any recent changes. 4: RIB FRACTURE: Still having pain esp. With any movement, having a large hemothorax which is causing a lot laft sided discomfort. He had a chest tube today , monitor for any improvement or more fluid. Continue with pain management and PT/OT to help him heel.   5: BACK LACERATION:Continue with wound care.  6:SCALP LACERATION: Healing today, keep monitor for any sign of infection 7:DM: His blood CBG is still little higher  Novolog and lantus 10 unit was started 2 days ago, monitor his response. 8:HTN:  HIs BP is 122/50 today, still little on lower side, hold his BP meds. And monitor. 9:URINARY  RETENTION: He was unable to void during his hospital stay, may be due to pain meds. And his BPH combined, had foley's yesterday. Reassess in a day or 2 as we don't want to have foley's for longer time.     This is a Careers information officer Note.  The care of the patient was discussed with Dr. Aundra Dubin and the assessment and plan formulated with their assistance.  Please see their attached note for official documentation of the daily encounter.   LOS: 3 days   Lorella Nimrod, Med Student 08/21/2013, 1:11 PM

## 2013-08-21 NOTE — Procedures (Signed)
Chest Tube Insertion Procedure Note  Indications:  Clinically significant Hemothorax and Large  Pre-operative Diagnosis: Hemothorax  Post-operative Diagnosis: Hemothorax and Large (> 1100cc)  Procedure Details  Informed consent was obtained for the procedure, including sedation.  Risks of lung perforation, hemorrhage, arrhythmia, and adverse drug reaction were discussed.   After sterile skin prep, using standard technique, a 32 French tube was placed in the left anterolateral 6th rib space.  Findings: >1100 ml of bloody fluid obtained  Estimated Blood Loss:  >1100cc         Specimens:  None              Complications:  None; patient tolerated the procedure well.         Disposition: Remained in SDU in stable condition.         Condition: stable  Attending Attestation: I performed the procedure.

## 2013-08-21 NOTE — Progress Notes (Signed)
Pt is alert and oriented. Pt signed a consent to have a chest tube placement to left side due to a large pleural effusion.  At 1220 Dr. Hulen Skains who preformed procedure injected lidocaine. At 1221 Dr. Hulen Skains gave orders to give 50 of fentanyl Iv. Nurse gave fentanyl. Pt tolerating well. Vitals were within normal limits. Dr. Hulen Skains gave orders to give versed 2 mg. Given by nurse. Pt tolerated well. At 1221 Dr. Hulen Skains made incision and at 1229 chest tube was in place. Pt slept through procedure. Two floor nurses, floor director, and respiratory in room for procedure.

## 2013-08-21 NOTE — Progress Notes (Signed)
Patient ID: Gerald Rosario, male   DOB: July 01, 1945, 68 y.o.   MRN: [id number]   LOS: 3 days   Subjective: Doing ok, pain controlled, mild SOB   Objective: Vital signs in last 24 hours: Temp:  [97.4 F (36.3 C)-98.5 F (36.9 C)] 97.9 F (36.6 C) (05/21 0400) Pulse Rate:  [73-95] 83 (05/21 0400) Resp:  [10-28] 10 (05/21 0400) BP: (47-134)/(25-71) 117/54 mmHg (05/21 0400) SpO2:  [94 %-100 %] 100 % (05/21 0400) Weight:  [184 lb 15.5 oz (83.9 kg)] 184 lb 15.5 oz (83.9 kg) (05/21 0400) Last BM Date: 08/17/13   IS: 776ml   Laboratory  CBC  Recent Labs  08/20/13 1409 08/21/13 0329  WBC 11.8* 10.5  HGB 9.6* 7.8*  HCT 29.4* 24.2*  PLT 236 215   BMET  Recent Labs  08/20/13 0433 08/21/13 0329  NA 130* 131*  K 4.7 5.7*  CL 97 98  CO2 21 22  GLUCOSE 151* 175*  BUN 16 26*  CREATININE 1.03 1.25  CALCIUM 8.5 8.3*   CBG (last 3)   Recent Labs  08/20/13 1227 08/20/13 1602 08/20/13 2133  GLUCAP 197* 212* 176*    Radiology Results CXR: Large increase left effusion (official read pending)   Physical Exam General appearance: alert and no distress Resp: clear to auscultation bilaterally Cardio: regular rate and rhythm GI: normal findings: bowel sounds normal and soft, non-tender Incision/Wound:Back lacs C/D/I   Assessment/Plan: Fall  Multiple left rib fxs -- Pulmonary toilet, would decrease Robaxin to 500-1000mg  po q6h prn on 5/23.  Back lacerations -- Local care ABL anemia -- Could be in patient's chest, may need chest tube vs thoracentesis Syncope  Multiple medical problems -- per primary service    Lisette Abu, PA-C Pager: [phone] General Trauma PA Pager: [phone]  08/21/2013

## 2013-08-22 ENCOUNTER — Inpatient Hospital Stay (HOSPITAL_COMMUNITY): Payer: Medicare Other

## 2013-08-22 DIAGNOSIS — S272XXA Traumatic hemopneumothorax, initial encounter: Secondary | ICD-10-CM

## 2013-08-22 DIAGNOSIS — I517 Cardiomegaly: Secondary | ICD-10-CM

## 2013-08-22 DIAGNOSIS — I5022 Chronic systolic (congestive) heart failure: Secondary | ICD-10-CM

## 2013-08-22 LAB — BASIC METABOLIC PANEL
BUN: 23 mg/dL (ref 6–23)
CO2: 23 mEq/L (ref 19–32)
Calcium: 8.1 mg/dL — ABNORMAL LOW (ref 8.4–10.5)
Chloride: 98 mEq/L (ref 96–112)
Creatinine, Ser: 1.04 mg/dL (ref 0.50–1.35)
GFR calc Af Amer: 83 mL/min — ABNORMAL LOW (ref >90)
GFR calc non Af Amer: 72 mL/min — ABNORMAL LOW (ref >90)
Glucose, Bld: 173 mg/dL — ABNORMAL HIGH (ref 70–99)
Potassium: 4.9 mEq/L (ref 3.7–5.3)
Sodium: 130 mEq/L — ABNORMAL LOW (ref 137–147)

## 2013-08-22 LAB — FOLATE: Folate: 15 ng/mL

## 2013-08-22 LAB — CBC WITH DIFFERENTIAL/PLATELET
Basophils Absolute: 0 10*3/uL (ref 0.0–0.1)
Basophils Relative: 0 % (ref 0–1)
Eosinophils Absolute: 0.1 10*3/uL (ref 0.0–0.7)
Eosinophils Relative: 1 % (ref 0–5)
HCT: 25 % — ABNORMAL LOW (ref 39.0–52.0)
Hemoglobin: 8.3 g/dL — ABNORMAL LOW (ref 13.0–17.0)
Lymphocytes Relative: 6 % — ABNORMAL LOW (ref 12–46)
Lymphs Abs: 0.6 10*3/uL — ABNORMAL LOW (ref 0.7–4.0)
MCH: 29 pg (ref 26.0–34.0)
MCHC: 33.2 g/dL (ref 30.0–36.0)
MCV: 87.4 fL (ref 78.0–100.0)
Monocytes Absolute: 0.9 10*3/uL (ref 0.1–1.0)
Monocytes Relative: 10 % (ref 3–12)
Neutro Abs: 8.1 10*3/uL — ABNORMAL HIGH (ref 1.7–7.7)
Neutrophils Relative %: 83 % — ABNORMAL HIGH (ref 43–77)
Platelets: 226 10*3/uL (ref 150–400)
RBC: 2.86 MIL/uL — ABNORMAL LOW (ref 4.22–5.81)
RDW: 14.4 % (ref 11.5–15.5)
WBC: 9.7 10*3/uL (ref 4.0–10.5)

## 2013-08-22 LAB — RETICULOCYTES
RBC.: 2.86 MIL/uL — ABNORMAL LOW (ref 4.22–5.81)
Retic Count, Absolute: 80.1 10*3/uL (ref 19.0–186.0)
Retic Ct Pct: 2.8 % (ref 0.4–3.1)

## 2013-08-22 LAB — GLUCOSE, CAPILLARY
Glucose-Capillary: 161 mg/dL — ABNORMAL HIGH (ref 70–99)
Glucose-Capillary: 163 mg/dL — ABNORMAL HIGH (ref 70–99)
Glucose-Capillary: 203 mg/dL — ABNORMAL HIGH (ref 70–99)
Glucose-Capillary: 184 mg/dL — ABNORMAL HIGH (ref 70–99)

## 2013-08-22 LAB — IRON AND TIBC
Iron: 13 ug/dL — ABNORMAL LOW (ref 42–135)
Saturation Ratios: 6 % — ABNORMAL LOW (ref 20–55)
TIBC: 205 ug/dL — ABNORMAL LOW (ref 215–435)
UIBC: 192 ug/dL (ref 125–400)

## 2013-08-22 LAB — VITAMIN B12: Vitamin B-12: 287 pg/mL (ref 211–911)

## 2013-08-22 LAB — ABO/RH: ABO/RH(D): A POS

## 2013-08-22 LAB — FERRITIN: Ferritin: 169 ng/mL (ref 22–322)

## 2013-08-22 MED ORDER — SODIUM CHLORIDE 0.9 % IV BOLUS (SEPSIS)
250.0000 mL | Freq: Once | INTRAVENOUS | Status: AC
  Administered 2013-08-22: 250 mL via INTRAVENOUS

## 2013-08-22 MED ORDER — POLYETHYLENE GLYCOL 3350 17 G PO PACK
17.0000 g | PACK | Freq: Every day | ORAL | Status: DC
  Administered 2013-08-22 – 2013-08-26 (×5): 17 g via ORAL
  Filled 2013-08-22 (×8): qty 1

## 2013-08-22 MED ORDER — REGADENOSON 0.4 MG/5ML IV SOLN
0.4000 mg | Freq: Once | INTRAVENOUS | Status: DC
  Filled 2013-08-22: qty 5

## 2013-08-22 MED ORDER — GLUCERNA SHAKE PO LIQD
237.0000 mL | Freq: Two times a day (BID) | ORAL | Status: DC
  Administered 2013-08-22: 237 mL via ORAL
  Filled 2013-08-22 (×6): qty 237

## 2013-08-22 NOTE — Progress Notes (Signed)
Echocardiogram 2D Echocardiogram limited has been performed.  Ines Bloomer 08/22/2013, 9:51 AM

## 2013-08-22 NOTE — Progress Notes (Addendum)
Unable to do Myoview with chest tubwe in place- will cancel and review with MD.  Kerin Ransom PA-C 08/22/2013 9:18 AM     SUBJECTIVE:  Feels better after chest tube.  OBJECTIVE:   Vitals:   Filed Vitals:   08/22/13 0800 08/22/13 0847 08/22/13 0916 08/22/13 1200  BP: 137/69  137/69 128/60  Pulse: 89  85 83  Temp: 98.2 F (36.8 C)   97.9 F (36.6 C)  TempSrc: Oral   Oral  Resp: 21   14  Height:      Weight:      SpO2: 100% 100%  100%   I&O's:   Intake/Output Summary (Last 24 hours) at 08/22/13 1352 Last data filed at 08/22/13 1216  Gross per 24 hour  Intake   1028 ml  Output   2480 ml  Net  -1452 ml   TELEMETRY: Reviewed telemetry pt in normal sinus rhythm:     PHYSICAL EXAM General: Well developed, well nourished, in no acute distress Head:   Normal cephalic and atramatic  Lungs:   No wheezing, decreased breath sounds at the left base Heart:   HRRR S1 S2  No JVD.   Abdomen: abdomen soft and non-tender Msk:  Back normal,  Normal strength and tone for age. Extremities:   No edema.   Neuro: Alert and oriented. Psych:  Normal affect, responds appropriately   LABS: Basic Metabolic Panel:  Recent Labs  08/21/13 1449 08/22/13 0425  NA 129* 130*  K 5.1 4.9  CL 98 98  CO2 20 23  GLUCOSE 221* 173*  BUN 26* 23  CREATININE 1.13 1.04  CALCIUM 8.5 8.1*   Liver Function Tests: No results found for this basename: AST, ALT, ALKPHOS, BILITOT, PROT, ALBUMIN,  in the last 72 hours No results found for this basename: LIPASE, AMYLASE,  in the last 72 hours CBC:  Recent Labs  08/21/13 0329  08/21/13 1428 08/22/13 0425  WBC 10.5  < > 10.7* 9.7  NEUTROABS 8.6*  --   --  8.1*  HGB 7.8*  < > 7.1* 8.3*  HCT 24.2*  < > 21.9* 25.0*  MCV 87.4  < > 86.9 87.4  PLT 215  < > 208 226  < > = values in this interval not displayed. Cardiac Enzymes:  Recent Labs  08/19/13 2247 08/20/13 0433 08/20/13 1010 08/21/13 0259  CKTOTAL  --  352*  --   --   TROPONINI 0.59*  --   0.36* <0.30   BNP: No components found with this basename: POCBNP,  D-Dimer: No results found for this basename: DDIMER,  in the last 72 hours Hemoglobin A1C: No results found for this basename: HGBA1C,  in the last 72 hours Fasting Lipid Panel:  Recent Labs  08/20/13 0433  CHOL 165  HDL 63  LDLCALC 79  TRIG 113  CHOLHDL 2.6   Thyroid Function Tests: No results found for this basename: TSH, T4TOTAL, FREET3, T3FREE, THYROIDAB,  in the last 72 hours Anemia Panel:  Recent Labs  08/22/13 0425  VITAMINB12 287  FOLATE 15.0  FERRITIN 169  TIBC 205*  IRON 13*  RETICCTPCT 2.8   Coag Panel:   Lab Results  Component Value Date   INR 1.11 08/18/2013   INR 1.0 04/26/2011   INR 1.21 10/23/2010    RADIOLOGY: Dg Chest 2 View  08/21/2013   CLINICAL DATA:  Multiple left rib fractures secondary to a fall.  EXAM: CHEST  2 VIEW  COMPARISON:  08/20/2013  FINDINGS:  There is a new large left pleural effusion or hemothorax. Multiple left rib fractures are again noted. There is compressive atelectasis of the left upper and lower lobes.  Right lung is clear.  IMPRESSION: New large left pleural effusion or hemothorax with compressive atelectasis in the left upper and lower lobes.  Critical Value/emergent results were called by telephone at the time of interpretation on 08/21/2013 at 7:58 AM to the patient's nurse, Dara, RN , who verbally acknowledged these results.   Electronically Signed   By: Rozetta Nunnery M.D.   On: 08/21/2013 07:57   Dg Chest 2 View  08/20/2013   CLINICAL DATA:  Recent rib fractures and pneumothorax, followup  EXAM: CHEST  2 VIEW  COMPARISON:  DG CHEST 1V PORT dated 08/19/2013  FINDINGS: Lungs are hypoaerated with crowding of the bronchovascular markings. Heart size at upper limits of normal. Improved aeration is noted at the lung bases. No pneumothorax or pneumomediastinum identified. Nondisplaced left lateral rib fractures are reidentified.  IMPRESSION: Improved aeration with  persistent left lower lobe opacity which could represent atelectasis or contusion in the setting of multiple left-sided rib fractures.  No pneumothorax or pneumomediastinum.   Electronically Signed   By: Conchita Paris M.D.   On: 08/20/2013 09:42   Dg Ribs Unilateral W/chest Left  08/18/2013   CLINICAL DATA:  History of fall with injury to the left posterior ribs.  EXAM: LEFT RIBS AND CHEST - 3+ VIEW  COMPARISON:  Chest x-ray 12/26/2012.  FINDINGS: Lung volumes are low. Linear bibasilar opacities favored to predominantly reflect subsegmental atelectasis. No pleural effusions. No definite pneumothorax. No evidence of pulmonary edema. Heart size is normal. Upper mediastinal contours are slightly distorted by patient's rotation to the left. Atherosclerosis in the thoracic aorta.  Dedicated views of the left ribs demonstrate numerous acute left-sided rib fractures involving the posterior aspects of the left fourth, fifth, sixth and ninth ribs, as well as the lateral aspects of the fourth, fifth, sixth and seventh ribs. In addition, there are old healed fractures of the posterolateral aspects of the left eighth and ninth ribs.  IMPRESSION: 1. Multiple nondisplaced left-sided rib fractures, several of which are segmental (fourth, fifth and sixth), as detailed above. No definite left-sided pneumothorax at this time. 2. Low lung volumes with bibasilar subsegmental atelectasis. 3. Atherosclerosis.   Electronically Signed   By: Vinnie Langton M.D.   On: 08/18/2013 06:00   Dg Lumbar Spine 2-3 Views  08/18/2013   CLINICAL DATA:  Low back and left leg pain.  EXAM: LUMBAR SPINE - 2-3 VIEW  COMPARISON:  Abdominal CT 05/18/2011.  FINDINGS: There are 5 lumbar type vertebral bodies. The alignment is normal. There is no evidence of acute fracture or focal disc space loss. Paraspinal osteophytes are stable.  IMPRESSION: No evidence of acute lumbar spine injury.   Electronically Signed   By: Camie Patience M.D.   On: 08/18/2013  10:23   Dg Pelvis 1-2 Views  08/18/2013   CLINICAL DATA:  History of trauma from a fall. Left posterior rib pain.  EXAM: PELVIS - 1-2 VIEW  COMPARISON:  No priors.  FINDINGS: Single AP view of the pelvis demonstrates no acute displaced fractures of the bony pelvic ring. Bilateral proximal femurs as visualized appear intact, and the femoral heads project over the acetabula bilaterally. 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  IMPRESSION: 1. No acute radiographic abnormality of the bony pelvis.   Electronically Signed   By: Quillian Quince  Entrikin M.D.   On: 08/18/2013 06:01   Ct Head Wo Contrast  08/18/2013   CLINICAL DATA:  Fall, head trauma.  EXAM: CT HEAD WITHOUT CONTRAST  TECHNIQUE: Contiguous axial images were obtained from the base of the skull through the vertex without intravenous contrast.  COMPARISON:  CT HEAD W/O CM dated 07/12/2010  FINDINGS: The ventricles and sulci are normal for age. No intraparenchymal hemorrhage, mass effect nor midline shift. Patchy supratentorial white matter hypodensities are less than expected for patient's age and though non-specific suggest sequelae of chronic small vessel ischemic disease. No acute large vascular territory infarcts.  No abnormal extra-axial fluid collections. Basal cisterns are patent. Moderate calcific atherosclerosis of the carotid siphons.  Small left suboccipital scalp hematoma with bubbles of subcutaneous gas most consistent laceration, no radiopaque foreign bodies. No skull fracture. Similar focal fat within the clivus, nonspecific. The included ocular globes and orbital contents are non-suspicious. Status post bilateral ocular lens implant. Worsening hand paranasal sinusitis with near complete opacification of the maxillary sinuses partially imaged. Density within the right maxillary sinus without destructive bony lesions in the included view.  IMPRESSION: Small left suboccipital scalp hematoma with suspected  laceration, no radiopaque foreign bodies or skull fracture. No acute intracranial process.  Worsening paranasal sinusitis, dense right maxillary component could reflect inspissated mucus or possible chronic fungal sinusitis.  Involutional changes. Mild white matter changes suggest chronic small vessel ischemic disease.   Electronically Signed   By: Elon Alas   On: 08/18/2013 06:28   Ct Chest Wo Contrast  08/18/2013   CLINICAL DATA:  Chest pain status post fall. Rib fractures on radiographs.  EXAM: CT CHEST WITHOUT CONTRAST  TECHNIQUE: Multidetector CT imaging of the chest was performed following the standard protocol without IV contrast.  COMPARISON:  Rib radiographs obtained today.  Chest CT 10/20/2010.  FINDINGS: The heart size is stable. There is no evidence of mediastinal hematoma. Atherosclerosis of the aorta, great vessels and coronary arteries is noted. There is no significant pericardial effusion or mediastinal lymphadenopathy.  As noted on earlier radiographs, there are multiple mildly displaced acute posterior left rib fractures. There is associated soft tissue emphysema within the posterior left chest wall. There is also small amount of air within the anterior epicardial fat. There is no generalized pneumomediastinum or pneumothorax.  There is mild dependent atelectasis in both lungs. There is an 8 mm well-circumscribed nodule inferiorly in the right upper lobe (image 24). On the reformatted images, this lies superior to the minor fissure and appears slightly larger than on the prior study. No other pulmonary nodules are identified. There is no endobronchial lesion.  There is no evidence acute sternal or spinal fracture. The visualized upper abdomen appears unremarkable.  IMPRESSION: 1. Multiple left-sided rib fractures with associated soft tissue emphysema and air within the pre cardiac fat. No generalized pneumomediastinum or pneumothorax. 2. Bibasilar atelectasis. 3. Atherosclerosis. 4.  Well-circumscribed right upper lobe pulmonary nodule may be slightly larger than on the prior study from 3 years ago. However, this does not demonstrate any aggressive characteristics. Follow up chest CT in 1 year suggested to evaluate stability.   Electronically Signed   By: Camie Patience M.D.   On: 08/18/2013 10:33   Ct Cervical Spine Wo Contrast  08/18/2013   CLINICAL DATA:  Pain post trauma  EXAM: CT CERVICAL SPINE WITHOUT CONTRAST  TECHNIQUE: Multidetector CT imaging of the cervical spine was performed without intravenous contrast. Multiplanar CT image reconstructions were also generated.  COMPARISON:  None.  FINDINGS: There is no acute fracture or spondylolisthesis. There is evidence of old trauma with remodeling in the spinous processes at C7 and T1. Prevertebral soft tissues and predental space regions are normal. There is moderately severe disc space narrowing at C4-5. There is milder disc space narrowing at C3-4 and C5-6. There is facet hypertrophy with exit foraminal narrowing at C4-5 of and C5-6 bilaterally. No disc extrusion or stenosis.  There is no apical pneumothorax apparent. Note that there is soft tissue air on the left and posteriorly, likely from the known rib fractures seen earlier in the day.  IMPRESSION: No acute fracture or spondylolisthesis. Soft tissue air is probably due to rib fractures. No apical pneumothorax seen on this study. Evidence suggesting old trauma involving the C7 and T1 spinous processes. 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   Electronically Signed   By: Lowella Grip M.D.   On: 08/18/2013 10:29   Dg Chest Port 1 View  08/22/2013   CLINICAL DATA:  Hemothorax  EXAM: PORTABLE CHEST - 1 VIEW  COMPARISON:  08/21/2013  FINDINGS: The cardiac shadow is stable. A left-sided pleural effusion is again identified and stable from the previous day. A left chest tube is again noted and stable as well. Underlying atelectasis is likely present. A few left rib  fractures are again noted. No pneumothorax is seen.  IMPRESSION: Posttraumatic changes in the left chest with evidence of pleural effusion. A chest tube is in place. No significant change from the previous day is noted.   Electronically Signed   By: Inez Catalina M.D.   On: 08/22/2013 07:54   Dg Chest Port 1 View  08/21/2013   CLINICAL DATA:  Chest tube placement.  EXAM: PORTABLE CHEST - 1 VIEW  COMPARISON:  08/21/2013 at 6:21 a.m.  FINDINGS: Interval placement of a left-sided chest tube with tip over the upper thorax in adequate position. Lungs are somewhat hypoinflated with mild interval improvement in a moderate to large left pleural fluid collection likely with associated compressive atelectasis. No definite pneumothorax. Remainder of the exam is unchanged to include several acute left posterior and lateral rib fractures.  IMPRESSION: Minimal improvement in patient's moderate to large left pleural fluid collection likely with associated compressive atelectasis post left chest tube placement. No definite pneumothorax seen.  Known multiple acute left rib fractures.   Electronically Signed   By: Marin Olp M.D.   On: 08/21/2013 13:16   Dg Chest Port 1 View  08/19/2013   CLINICAL DATA:  On rib fractures ; pain and shortness of breath.  EXAM: PORTABLE CHEST - 1 VIEW  COMPARISON:  CT scan of the chest dated 18 Aug 2013  FINDINGS: The lungs are mildly hypoinflated. The costophrenic angle on the right sharp laterally. On the left there is some blunting of the costophrenic angle and partial obscuration of the hemidiaphragm. There is no pneumothorax. The cardiopericardial silhouette is normal in size. The pulmonary vascularity is not engorged. Are fractures through the lateral aspect of the left fourth, fifth, and sixth ribs. The fifth rib appears to be fractured more laterally as well. The mediastinum is normal in width.  IMPRESSION: There is no evidence of a pneumothorax. The previously described  pneumomediastinum is not well demonstrated on the current exam. There is a small left pleural effusion consistent with a hemothorax given the acute rib fractures.   Electronically Signed   By: David  Martinique   On: 08/19/2013 06:24      ASSESSMENT: Syncope,  CAD  PLAN:  The chest tube was apparently going to block the nuclear images. We'll plan for stress test to look for reversible ischemia and thus viability once the chest tube is removed.  EP service also following to consider possibility of arrhythmia as cause of syncope.  CAD/nstemi: No angina. If he does have multivessel disease with viable myocardium, would have to consider revascularization.  We'll start evaluation with nuclear stress test. Avoiding anticoagulation due to hemothorax.  Jettie Booze, MD  08/22/2013  1:52 PM

## 2013-08-22 NOTE — Progress Notes (Signed)
Occupational Therapy Treatment Patient Details Name: Gerald Rosario MRN: [id number] DOB: Jan 26, 1946 Today's Date: 08/22/2013    History of present illness Pt is a 68 y/o male admitted s/p syncope and fall at home, in which he sustained L sided rib fractures and a laceration to his upper thoracic area.   OT comments  Pt very limited by pain and fatigue this session for static standing grooming task. Pt agreeable to [facility] search at this time. Pt educated on the need for continued therapy prior to home alone.  Follow Up Recommendations  SNF    Equipment Recommendations  Other (comment) (defer Snf)    Recommendations for Other Services      Precautions / Restrictions Precautions Precautions: Fall Precaution Comments: L rib fractures, mid-line upper thoracic laceration, chest tube. Per RN, chest tube suction not to be disconnected for therapy.  Restrictions Weight Bearing Restrictions: No       Mobility Bed Mobility Overal bed mobility: Needs Assistance Bed Mobility: Supine to Sit     Supine to sit: Supervision;HOB elevated     General bed mobility comments: Increased time to transition to full sitting. Exited bed on the L due to chest tube, and although this is the side of his rib fractures was able to maneuver well without assist.   Transfers Overall transfer level: Needs assistance Equipment used: Rolling walker (2 wheeled) Transfers: Sit to/from [facility] Sit to Stand: Min guard Stand pivot transfers: Min guard       General transfer comment: Pt able to stand to the RW and pivot slowly around to the recliner, in order for therapist to maintain lines.    Balance Overall balance assessment: Needs assistance Sitting-balance support: Feet supported;No upper extremity supported Sitting balance-Leahy Scale: Fair   Postural control: Posterior lean Standing balance support: Bilateral upper extremity supported Standing balance-Leahy Scale:  Fair Standing balance comment: Pt required UE support with static standing                   ADL Overall ADL's : Needs assistance/impaired     Grooming: Oral care;Minimal assistance;Standing Grooming Details (indicate cue type and reason): PT presented with bedside tray for static standing oral care. Pt required return to sitting due to pain . pt static standing and rushing through task due to fatigue and pain. Pt no recall to wash or replace retainer on bottom teeth. Pt reports extreme fatigue s/p static standing 3 minutes                               General ADL Comments: Due to chest tube constant suction limited mobility allowed. Pt noted to have incr drainage with static standing position. pt static standing x2 this session. Pt required x1 rest break for oral care      Vision                     Perception     Praxis      Cognition   Behavior During Therapy: [facility] for tasks assessed/performed Overall Cognitive Status: Impaired/Different from baseline Area of Impairment: Memory   Current Attention Level: Sustained Memory: Decreased short-term memory    Safety/Judgement: Decreased awareness of deficits Awareness: Anticipatory Problem Solving: Decreased initiation;Slow processing General Comments: Pt confusing therapist with other staff members - in and out of room multiple times to gather equipment and speak to the nurse. Every time therapist entered pt seemed  confused as to who I was and required reminding of my name and occupation.    Extremity/Trunk Assessment               Exercises     Shoulder Instructions       General Comments      Pertinent Vitals/ Pain       VSS  Home Living                                          Prior Functioning/Environment              Frequency Min 2X/week     Progress Toward Goals  OT Goals(current goals can now be found in the care plan section)  Progress towards  OT goals: Not progressing toward goals - comment (new chest tube with fatigue)  Acute Rehab OT Goals Patient Stated Goal: to get out of hospital OT Goal Formulation: With patient Time For Goal Achievement: 09/02/13 Potential to Achieve Goals: Good ADL Goals Pt Will Perform Grooming: with modified independence;standing Pt Will Perform Upper Body Bathing: with modified independence;standing Pt Will Perform Lower Body Bathing: with modified independence;sit to/from stand Pt Will Perform Lower Body Dressing: with modified independence;sit to/from stand Pt Will Transfer to Toilet: with modified independence;regular height toilet Pt Will Perform Tub/Shower Transfer: Tub transfer;ambulating;with modified independence  Plan Discharge plan remains appropriate    Co-evaluation                 End of Session     Activity Tolerance Patient limited by fatigue   Patient Left in chair;with call bell/phone within reach;with chair alarm set   Nurse Communication Mobility status;Precautions        Time: 2694-8546 OT Time Calculation (min): 14 min  Charges: OT General Charges $OT Visit: 1 Procedure OT Treatments $Self Care/Home Management : 8-22 mins  Peri Maris 08/22/2013, 1:55 PM Pager: [phone]

## 2013-08-22 NOTE — Progress Notes (Signed)
Physical Therapy Treatment Patient Details Name: Gerald Rosario MRN: [id number] DOB: 1945/12/02 Today's Date: 08/22/2013    History of Present Illness Pt is a 68 y/o male admitted s/p syncope and fall at home, in which he sustained L sided rib fractures and a laceration to his upper thoracic area.    PT Comments    Pt progressing slowly towards physical therapy goals. Mobility limited due to chest tube, as PT was not able to disconnect suctioning for ambulation per RN. Discharge disposition updated to reflect current level of function, and feel pt will benefit from STR at the SNF level prior to returning home. Will continue to progress per POC.   Follow Up Recommendations  SNF     Equipment Recommendations  Rolling walker with 5" wheels    Recommendations for Other Services       Precautions / Restrictions Precautions Precautions: Fall Precaution Comments: L rib fractures, mid-line upper thoracic laceration, chest tube. Per RN, chest tube suction not to be disconnected for therapy.  Restrictions Weight Bearing Restrictions: No    Mobility  Bed Mobility Overal bed mobility: Needs Assistance Bed Mobility: Supine to Sit     Supine to sit: Supervision;HOB elevated     General bed mobility comments: Increased time to transition to full sitting. Exited bed on the L due to chest tube, and although this is the side of his rib fractures was able to maneuver well without assist.   Transfers Overall transfer level: Needs assistance Equipment used: Rolling walker (2 wheeled) Transfers: Sit to/from [facility] Sit to Stand: Min guard Stand pivot transfers: Min guard       General transfer comment: Pt able to stand to the RW and pivot slowly around to the recliner, in order for therapist to maintain lines.  Ambulation/Gait             General Gait Details: Ambulation limited due to chest tube   Stairs            Wheelchair Mobility     Modified Rankin (Stroke Patients Only)       Balance Overall balance assessment: Needs assistance Sitting-balance support: Feet supported;No upper extremity supported Sitting balance-Leahy Scale: Fair   Postural control: Posterior lean Standing balance support: Bilateral upper extremity supported Standing balance-Leahy Scale: Fair Standing balance comment: Pt required UE support with static standing                    Cognition Arousal/Alertness: Awake/alert Behavior During Therapy: WFL for tasks assessed/performed Overall Cognitive Status: Impaired/Different from baseline Area of Impairment: Memory   Current Attention Level: Sustained Memory: Decreased short-term memory   Safety/Judgement: Decreased awareness of deficits Awareness: Anticipatory Problem Solving: Decreased initiation;Slow processing General Comments: Pt confusing therapist with other staff members - in and out of room multiple times to gather equipment and speak to the nurse. Every time therapist entered pt seemed confused as to who I was and required reminding of my name and occupation.    Exercises      General Comments General comments (skin integrity, edema, etc.): Reviewed HEP      Pertinent Vitals/Pain Pt on RA during a portion of the transfer from bed to chair. Sats decreased to 78% at the lowest, and with cueing for pursed-lip breathing was able to improve sats up to 88%. At rest sats remained in the mid to high 90's. Supplemental O2 again donned at end of session.     Home Living  Prior Function            PT Goals (current goals can now be found in the care plan section) Acute Rehab PT Goals Patient Stated Goal: to get out of hospital PT Goal Formulation: With patient Time For Goal Achievement: 08/25/13 Potential to Achieve Goals: Good Progress towards PT goals: Progressing toward goals    Frequency  Min 2X/week    PT Plan Discharge plan needs  to be updated;Frequency needs to be updated    Co-evaluation             End of Session Equipment Utilized During Treatment: Oxygen Activity Tolerance: Patient limited by pain Patient left: in chair;with chair alarm set     Time: 8127-5170 PT Time Calculation (min): 40 min  Charges:  $Gait Training: 8-22 mins $Therapeutic Activity: 23-37 mins                    G Codes:      Jolyn Lent 24-Aug-2013, 1:55 PM  Jolyn Lent, PT, DPT Acute Rehabilitation Services Pager: [phone]

## 2013-08-22 NOTE — Progress Notes (Signed)
Notified Dr. Gordy Levan that pt did not have a bowel movement yesterday or last night even after receiving the kayexalate per md orders.

## 2013-08-22 NOTE — Progress Notes (Signed)
Pt assisted to bedside commode to have a bowel movement. Pt states he feels like he needs to go but cannot. Paged Dr. Gordy Levan to request a laxative. Passing on information to night nurse. Pt states he feels better today and pain in ribs and from  Chest tube on movement has been reduced from a 7-9/10 yesterday to a 2-3/10 today.

## 2013-08-22 NOTE — Progress Notes (Signed)
Dr. Caryl Comes on vacation and Dr. Irish Lack covering therefore paged. Explained situation about nuclear medicine thinking they were unable to do stress test  To Dr. Irish Lack and reasoning provided. Dr. Irish Lack stated that he thought Nuclear medicine should try to do test unless they absolutely thought it would not provide a picture and it would be a waste of time. Nurse called nuclear medicine and reported to nurse working there. In meantime a Mindi Curling PA-C canceled test. Nurse in nuclear medicine stated they would contact PA and give him this information.

## 2013-08-22 NOTE — Progress Notes (Signed)
Subjective: Patient is feeling little better today, still having left chest pain increased with any movement but his continuous chest pressure has decreased after the CT. Still anorexic . Didn't had any bowel movement since admission. Objective: Vital signs in last 24 hours: Filed Vitals:   08/22/13 0800 08/22/13 0847 08/22/13 0916 08/22/13 1200  BP: 137/69  137/69 128/60  Pulse: 89  85 83  Temp: 98.2 F (36.8 C)   97.9 F (36.6 C)  TempSrc: Oral   Oral  Resp: 21   14  Height:      Weight:      SpO2: 100% 100%  100%   Weight change: 0.2 kg (7.1 oz)  Intake/Output Summary (Last 24 hours) at 08/22/13 1325 Last data filed at 08/22/13 1216  Gross per 24 hour  Intake   1028 ml  Output   2480 ml  Net  -1452 ml   EXAM: GENERAL:  Alert and oriented , comfortable since yesterday HEENT: PERRL, EOMI  NECK: Supple, no JVD, no adenopathy  LUNG: Decreased breath sound at left with some basal crackles, improved air entry as compare to yesterday. HEART: RRR, S1 and S@, no MRG  ABD: S/ND/NT. No HSM. BS +  NEURO;A & OX 3. Moving all extremities, no gross neuro. Deficit  EXT: no edema, Hands and feet looks more pallor then rest of body, no cyanosis  Lab Results: @LABTEST2 @ Micro Results: Recent Results (from the past 240 hour(s))  MRSA PCR SCREENING     Status: None   Collection Time    08/18/13  4:00 PM      Result Value Ref Range Status   MRSA by PCR NEGATIVE  NEGATIVE Final   Comment:            The GeneXpert MRSA Assay (FDA     approved for NASAL specimens     only), is one component of a     comprehensive MRSA colonization     surveillance program. It is not     intended to diagnose MRSA     infection nor to guide or     monitor treatment for     MRSA infections.   Studies/Results: Dg Chest 2 View  08/21/2013   CLINICAL DATA:  Multiple left rib fractures secondary to a fall.  EXAM: CHEST  2 VIEW  COMPARISON:  08/20/2013  FINDINGS: There is a new large left pleural  effusion or hemothorax. Multiple left rib fractures are again noted. There is compressive atelectasis of the left upper and lower lobes.  Right lung is clear.  IMPRESSION: New large left pleural effusion or hemothorax with compressive atelectasis in the left upper and lower lobes.  Critical Value/emergent results were called by telephone at the time of interpretation on 08/21/2013 at 7:58 AM to the patient's nurse, Dara, RN , who verbally acknowledged these results.   Electronically Signed   By: Rozetta Nunnery M.D.   On: 08/21/2013 07:57   Dg Chest Port 1 View  08/22/2013   CLINICAL DATA:  Hemothorax  EXAM: PORTABLE CHEST - 1 VIEW  COMPARISON:  08/21/2013  FINDINGS: The cardiac shadow is stable. A left-sided pleural effusion is again identified and stable from the previous day. A left chest tube is again noted and stable as well. Underlying atelectasis is likely present. A few left rib fractures are again noted. No pneumothorax is seen.  IMPRESSION: Posttraumatic changes in the left chest with evidence of pleural effusion. A chest tube is in place. No significant  change from the previous day is noted.   Electronically Signed   By: Inez Catalina M.D.   On: 08/22/2013 07:54   Dg Chest Port 1 View  08/21/2013   CLINICAL DATA:  Chest tube placement.  EXAM: PORTABLE CHEST - 1 VIEW  COMPARISON:  08/21/2013 at 6:21 a.m.  FINDINGS: Interval placement of a left-sided chest tube with tip over the upper thorax in adequate position. Lungs are somewhat hypoinflated with mild interval improvement in a moderate to large left pleural fluid collection likely with associated compressive atelectasis. No definite pneumothorax. Remainder of the exam is unchanged to include several acute left posterior and lateral rib fractures.  IMPRESSION: Minimal improvement in patient's moderate to large left pleural fluid collection likely with associated compressive atelectasis post left chest tube placement. No definite pneumothorax seen.  Known  multiple acute left rib fractures.   Electronically Signed   By: Marin Olp M.D.   On: 08/21/2013 13:16   Medications: medication reviewed Scheduled Meds: . aspirin EC  81 mg Oral Daily  . atorvastatin  40 mg Oral q1800  . bacitracin   Topical BID  . feeding supplement (GLUCERNA SHAKE)  237 mL Oral BID BM  . insulin aspart  0-9 Units Subcutaneous TID WC  . insulin glargine  10 Units Subcutaneous QHS  . methocarbamol  1,500 mg Oral QID  . metoprolol tartrate  12.5 mg Oral BID  . mometasone-formoterol  2 puff Inhalation BID  . pantoprazole  40 mg Oral Daily  . [START ON 08/23/2013] regadenoson  0.4 mg Intravenous Once  . sodium chloride  250 mL Intravenous Once  . sodium chloride  3 mL Intravenous Q12H  . Tdap  0.5 mL Intramuscular Once  . traMADol  100 mg Oral 4 times per day   Continuous Infusions:  PRN Meds:.acetaminophen, calcium carbonate, ondansetron (ZOFRAN) IV, oxyCODONE Assessment/Plan: 1: LEFT HEMOTHORAX: Patient had a chest tube placed yesterday,still draining bloody fluid, about 110cc since this morning, continue monitor his drainage, vitals and CBC , if there is more drop, might need another blood transfusion. We already gave him 1 unit of blood yesterday, and his Hb become 8.3 from 7.1 from yesterday. Repeat CXR and CBC tomorrow. 2: SYNCOPE: Pt. Was feeling  better this morning. Chest tightness and pain has improved after chest tube placement.  3:NSTEMI: His troponin has normalized,mightt had demand ischemia after that initial episode of syncope. Cardialogy is following, had his LINQ recorded implanted , Scheduled to have a stress test today which was cancelled due to his chest tube. Had another ECHO today, ordered by cardiology which shows trivial amount of pericardial fluid which was not there before, rest is same. keep him on telemetry and monitoring any recent changes.  4: RIB FRACTURE: Still having pain esp. With any movement, his chest discomfort has decreased after the  CT placement.  Continue with pain management and PT/OT to help him heel.  5: BACK LACERATION:Continue with wound care.  6:SCALP LACERATION: Healing today, keep monitor for any sign of infection  7:DM: His blood CBG is still little higher Novolog and lantus 10 unit was started 2 days ago, monitor his response. We don't want to increase his dose as he is still not eating much. 8:HTN: HIs BP is 139/69  today, much improved, keep monitoring.  9:URINARY RETENTION: He was unable to void during his hospital stay, may be due to pain meds. And his BPH combined, had foley's in since 2 days. Reassess in a day or 2  as we don't want to have foley's for longer time. 10:ANOREXIA: Still not eating well, says don't really feel like eating, He was on liquid diet, no N/V , can proceed to soft diet, add some Glucerna and about 250 ml of 0.9 saline.   This is a Careers information officer Note.  The care of the patient was discussed with Dr. Aundra Dubin and the assessment and plan formulated with their assistance.  Please see their attached note for official documentation of the daily encounter.   LOS: 4 days   Lorella Nimrod, Med Student 08/22/2013, 1:25 PM

## 2013-08-22 NOTE — Progress Notes (Signed)
Called nuclear medicine and talked to nurse about patient having a chest tube placed yesterday as pt is suppose to go to have a stress test. RN stated she was not aware chest tube placed yesterday and thought there might be some interference with chest tube and having a stress test. She stated she was unsure about if they could do it. Nurse stated she would call cardiology who ordered test to discuss.

## 2013-08-22 NOTE — Progress Notes (Signed)
Patient ID: Gerald Rosario, male   DOB: Oct 09, 1945, 68 y.o.   MRN: [id number] 2 Days Post-Op  Subjective: Breathing better, less pressure in L chest  Objective: Vital signs in last 24 hours: Temp:  [97.5 F (36.4 C)-98.7 F (37.1 C)] 97.8 F (36.6 C) (05/22 0400) Pulse Rate:  [80-96] 86 (05/22 0600) Resp:  [9-23] 10 (05/22 0600) BP: (102-139)/(49-104) 125/64 mmHg (05/22 0600) SpO2:  [96 %-100 %] 100 % (05/22 0600) Weight:  [185 lb 6.5 oz (84.1 kg)] 185 lb 6.5 oz (84.1 kg) (05/22 0500) Last BM Date: 08/17/13  Intake/Output from previous day: 05/21 0701 - 05/22 0700 In: 778 [P.O.:200; I.V.:3; Blood:325; IV Piggyback:250] Out: 2480 [Urine:950; Chest Tube:1530] Intake/Output this shift:    General appearance: alert and cooperative Resp: mild wheeze on L Chest wall: left sided chest wall tenderness Cardio: regular rate and rhythm GI: soft, NT  Lab Results: CBC   Recent Labs  08/21/13 1428 08/22/13 0425  WBC 10.7* 9.7  HGB 7.1* 8.3*  HCT 21.9* 25.0*  PLT 208 226   BMET  Recent Labs  08/21/13 1449 08/22/13 0425  NA 129* 130*  K 5.1 4.9  CL 98 98  CO2 20 23  GLUCOSE 221* 173*  BUN 26* 23  CREATININE 1.13 1.04  CALCIUM 8.5 8.1*   PT/INR No results found for this basename: LABPROT, INR,  in the last 72 hours ABG No results found for this basename: PHART, PCO2, PO2, HCO3,  in the last 72 hours  Studies/Results: Dg Chest 2 View  08/21/2013   CLINICAL DATA:  Multiple left rib fractures secondary to a fall.  EXAM: CHEST  2 VIEW  COMPARISON:  08/20/2013  FINDINGS: There is a new large left pleural effusion or hemothorax. Multiple left rib fractures are again noted. There is compressive atelectasis of the left upper and lower lobes.  Right lung is clear.  IMPRESSION: New large left pleural effusion or hemothorax with compressive atelectasis in the left upper and lower lobes.  Critical Value/emergent results were called by telephone at the time of interpretation on  08/21/2013 at 7:58 AM to the patient's nurse, Dara, RN , who verbally acknowledged these results.   Electronically Signed   By: Rozetta Nunnery M.D.   On: 08/21/2013 07:57   Dg Chest Port 1 View  08/21/2013   CLINICAL DATA:  Chest tube placement.  EXAM: PORTABLE CHEST - 1 VIEW  COMPARISON:  08/21/2013 at 6:21 a.m.  FINDINGS: Interval placement of a left-sided chest tube with tip over the upper thorax in adequate position. Lungs are somewhat hypoinflated with mild interval improvement in a moderate to large left pleural fluid collection likely with associated compressive atelectasis. No definite pneumothorax. Remainder of the exam is unchanged to include several acute left posterior and lateral rib fractures.  IMPRESSION: Minimal improvement in patient's moderate to large left pleural fluid collection likely with associated compressive atelectasis post left chest tube placement. No definite pneumothorax seen.  Known multiple acute left rib fractures.   Electronically Signed   By: Marin Olp M.D.   On: 08/21/2013 13:16    Anti-infectives: Anti-infectives   Start     Dose/Rate Route Frequency Ordered Stop   08/18/13 0645  ceFAZolin (ANCEF) IVPB 1 g/50 mL premix     1 g 100 mL/hr over 30 Minutes Intravenous  Once 08/18/13 4270 08/18/13 0804      Assessment/Plan: Fall  Multiple left rib fxs/hemothorax -- Pulmonary toilet, L CT placed 5/21, continue CT to  suction today, CXR in AM  Back lacerations -- Local care ABL anemia -- due to L HTX, up some this AM Syncope  Multiple medical problems -- per primary service, stress test P  LOS: 4 days    Georganna Skeans, MD, MPH, FACS Trauma: [phone] General Surgery: [phone]  08/22/2013

## 2013-08-22 NOTE — Progress Notes (Signed)
Subjective:   VSS.  Pt feeling better this AM after his thoracentesis yesterday and reports less pain.  Breathing better. However, has not had a BM since Sunday even with kayexalate.  Hgb stable after 1 unit of prbcs yesterday.   Objective:   Vital signs in last 24 hours: Filed Vitals:   08/22/13 1635 08/22/13 2025 08/22/13 2056 08/22/13 2242  BP: 132/69 132/64  127/63  Pulse: 85 104  85  Temp: 97.3 F (36.3 C) 97.9 F (36.6 C)    TempSrc: Oral Oral    Resp: 16 21    Height:      Weight:      SpO2: 99% 94% 95%     Weight: Filed Weights   08/20/13 0500 08/21/13 0400 08/22/13 0500  Weight: 185 lb 3.2 oz (84.006 kg) 184 lb 15.5 oz (83.9 kg) 185 lb 6.5 oz (84.1 kg)    Ins/Outs:  Intake/Output Summary (Last 24 hours) at 08/22/13 2256 Last data filed at 08/22/13 1900  Gross per 24 hour  Intake    653 ml  Output   1180 ml  Net   -527 ml    Physical Exam: Constitutional: Vital signs reviewed.  Patient is sitting up in bed left chest tube in place.   HEENT: [place]/AT; PERRL, EOMI, conjunctivae normal, no scleral icterus  Cardiovascular: RRR, no MRG Pulmonary/Chest: normal respiratory effort, no accessory muscle use, left chest tube in place without rales, wheezes, or rhonchi Abdominal: Soft. +BS, NT/ND Neurological: A&O x3, CN II-XII grossly intact; non-focal exam Musculoskeletal: 15cm laceration R upper back at approximately the T3 level.   Abrasions noted left mid thoracic and back region with TTP. No midline spinal tenderness noted.  Extremities: 2+DP b/l, no C/C/E  Skin: Warm, dry.    Lab Results:  BMP:  Recent Labs Lab 08/18/13 0658 08/18/13 1345  08/21/13 1449 08/22/13 0425  NA 139  --   < > 129* 130*  K 4.8  --   < > 5.1 4.9  CL 105  --   < > 98 98  CO2  --   --   < > 20 23  GLUCOSE 178*  --   < > 221* 173*  BUN 21  --   < > 26* 23  CREATININE 1.60*  1.70*  --   < > 1.13 1.04  CALCIUM  --   --   < > 8.5 8.1*  MG  --  2.0  --   --   --   < > = values  in this interval not displayed. Anion Gap:    CBC:  Recent Labs Lab 08/21/13 0329  08/21/13 1428 08/22/13 0425  WBC 10.5  < > 10.7* 9.7  NEUTROABS 8.6*  --   --  8.1*  HGB 7.8*  < > 7.1* 8.3*  HCT 24.2*  < > 21.9* 25.0*  MCV 87.4  < > 86.9 87.4  PLT 215  < > 208 226  < > = values in this interval not displayed.  Coagulation:  Recent Labs Lab 08/18/13 1345  LABPROT 14.1  INR 1.11    CBG:            Recent Labs Lab 08/21/13 1623 08/21/13 2130 08/22/13 0813 08/22/13 1143 08/22/13 1640 08/22/13 2125  GLUCAP 177* 187* 161* 163* 203* 184*           HA1C:       Recent Labs Lab 08/18/13 1345  HGBA1C 7.4*    Lipid  Panel:  Recent Labs Lab 08/20/13 0433  CHOL 165  HDL 63  LDLCALC 79  TRIG 113  CHOLHDL 2.6    LFTs:  Recent Labs Lab 08/18/13 1345  AST 32  ALT 17  ALKPHOS 50  BILITOT 0.8  PROT 7.1  ALBUMIN 3.5    Pancreatic Enzymes: No results found for this basename: LIPASE, AMYLASE,  in the last 168 hours  Lactic Acid/Procalcitonin:  Recent Labs Lab 08/18/13 0643 08/18/13 1345  LATICACIDVEN 2.4* 2.0    Ammonia: No results found for this basename: AMMONIA,  in the last 168 hours  Cardiac Enzymes:  Recent Labs Lab 08/18/13 1345  08/19/13 2247 08/20/13 0433 08/20/13 1010 08/21/13 0259  CKTOTAL 537*  --   --  352*  --   --   CKMB 7.0*  --   --   --   --   --   TROPONINI 0.32*  < > 0.59*  --  0.36* <0.30  < > = values in this interval not displayed.  EKG: EKG Interpretation  Date/Time:  Monday Aug 18 2013 04:59:50 EDT Ventricular Rate:  78 PR Interval:  169 QRS Duration: 107 QT Interval:  394 QTC Calculation: 449 R Axis:   6 Text Interpretation:  Sinus rhythm Anterior infarct, old ED PHYSICIAN INTERPRETATION AVAILABLE IN [facility] Confirmed by TEST, Record ([id number]) on 08/20/2013 7:15:39 AM   BNP: No results found for this basename: PROBNP,  in the last 168 hours  D-Dimer: No results found for this basename:  DDIMER,  in the last 168 hours  Urinalysis:  Recent Labs Lab 08/18/13 1200  COLORURINE YELLOW  LABSPEC 1.014  PHURINE 5.0  GLUCOSEU NEGATIVE  HGBUR NEGATIVE  BILIRUBINUR NEGATIVE  KETONESUR 15*  PROTEINUR NEGATIVE  UROBILINOGEN 0.2  NITRITE NEGATIVE  LEUKOCYTESUR TRACE*    Micro Results: Recent Results (from the past 240 hour(s))  MRSA PCR SCREENING     Status: None   Collection Time    08/18/13  4:00 PM      Result Value Ref Range Status   MRSA by PCR NEGATIVE  NEGATIVE Final   Comment:            The GeneXpert MRSA Assay (FDA     approved for NASAL specimens     only), is one component of a     comprehensive MRSA colonization     surveillance program. It is not     intended to diagnose MRSA     infection nor to guide or     monitor treatment for     MRSA infections.    Blood Culture: No results found for this basename: sdes,  specrequest,  cult,  reptstatus    Studies/Results: Dg Chest 2 View  08/21/2013   CLINICAL DATA:  Multiple left rib fractures secondary to a fall.  EXAM: CHEST  2 VIEW  COMPARISON:  08/20/2013  FINDINGS: There is a new large left pleural effusion or hemothorax. Multiple left rib fractures are again noted. There is compressive atelectasis of the left upper and lower lobes.  Right lung is clear.  IMPRESSION: New large left pleural effusion or hemothorax with compressive atelectasis in the left upper and lower lobes.  Critical Value/emergent results were called by telephone at the time of interpretation on 08/21/2013 at 7:58 AM to the patient's nurse, Dara, RN , who verbally acknowledged these results.   Electronically Signed   By: Rozetta Nunnery M.D.   On: 08/21/2013 07:57   Dg Chest Port 1  View  08/22/2013   CLINICAL DATA:  Hemothorax  EXAM: PORTABLE CHEST - 1 VIEW  COMPARISON:  08/21/2013  FINDINGS: The cardiac shadow is stable. A left-sided pleural effusion is again identified and stable from the previous day. A left chest tube is again noted and  stable as well. Underlying atelectasis is likely present. A few left rib fractures are again noted. No pneumothorax is seen.  IMPRESSION: Posttraumatic changes in the left chest with evidence of pleural effusion. A chest tube is in place. No significant change from the previous day is noted.   Electronically Signed   By: Inez Catalina M.D.   On: 08/22/2013 07:54   Dg Chest Port 1 View  08/21/2013   CLINICAL DATA:  Chest tube placement.  EXAM: PORTABLE CHEST - 1 VIEW  COMPARISON:  08/21/2013 at 6:21 a.m.  FINDINGS: Interval placement of a left-sided chest tube with tip over the upper thorax in adequate position. Lungs are somewhat hypoinflated with mild interval improvement in a moderate to large left pleural fluid collection likely with associated compressive atelectasis. No definite pneumothorax. Remainder of the exam is unchanged to include several acute left posterior and lateral rib fractures.  IMPRESSION: Minimal improvement in patient's moderate to large left pleural fluid collection likely with associated compressive atelectasis post left chest tube placement. No definite pneumothorax seen.  Known multiple acute left rib fractures.   Electronically Signed   By: Marin Olp M.D.   On: 08/21/2013 13:16    Medications:  Scheduled Meds: . aspirin EC  81 mg Oral Daily  . atorvastatin  40 mg Oral q1800  . bacitracin   Topical BID  . feeding supplement (GLUCERNA SHAKE)  237 mL Oral BID BM  . insulin aspart  0-9 Units Subcutaneous TID WC  . insulin glargine  10 Units Subcutaneous QHS  . methocarbamol  1,500 mg Oral QID  . metoprolol tartrate  12.5 mg Oral BID  . mometasone-formoterol  2 puff Inhalation BID  . pantoprazole  40 mg Oral Daily  . polyethylene glycol  17 g Oral Daily  . [START ON 08/23/2013] regadenoson  0.4 mg Intravenous Once  . sodium chloride  3 mL Intravenous Q12H  . Tdap  0.5 mL Intramuscular Once  . traMADol  100 mg Oral 4 times per day   Continuous Infusions:  PRN Meds:  acetaminophen, calcium carbonate, ondansetron (ZOFRAN) IV, oxyCODONE  Antibiotics: Antibiotics Given (last 72 hours)   None      Day of Hospitalization: 4  Consults: Treatment Team:  Trauma Md, MD Rounding Lbcardiology, MD  Assessment/Plan:   Principal Problem:   Syncope Active Problems:   HYPERTENSION   Coronary artery disease   Ischemic cardiomyopathy   Diabetes mellitus   Hyperkalemia   Chronic systolic heart failure   Multiple fractures of ribs of left side   Laceration of skin of scalp   Hematoma of occipital surface of head   Chronic sinusitis   Laceration of skin   NSTEMI (non-ST elevated myocardial infarction)   Pleural effusion   Urine retention   Syncope and collapse   Pleural effusion, left  Acute left-sided chest pain Improving, s/p left chest tube for large pleural hemothorax.  Still c/o left sided chest pain this AM.  No N/V.  Troponin trended down to normal.  Hgb up today after 1 unit of prbcs yesterday.  Surgery, cardiology following.  -monitor -cbc in AM  Multiple segmental rib fractures at risk for flail chest s/p CXR showed large left  pleural effusion--thoracentesis with hemothorax and >1000cc. -pain management -pulmonary toilet/IS  Hyperkalemia Resolved.  No EKG changes.  Given kayexalate x 2 yesterday.  -BMP in AM  Syncope  Stable.  Trops trended down.  EP consulted and placed implantable loop recorder.  5/19 echo: LVEF 55-60% with LA dilation but unchanged from 2013 echo.   -appreciate EP recs  NSTEMI Stable.  Trops trended down.  Lipid panel OK.   -atorvastatin to 40mg  qd  Upper back skin laceration 15 cm laceration at risk for skin necrosis.  Trauma consulted in ED and laceration repaired by trama service. -monitor  Left suboccipital scalp hematoma and laceration -monitor  AKI Resolved.  Baseline is 1.1-1.2.  He was noted to have mild elevated Cr at 1.7 on admission. CK trending down.  -hold lasix -avoid nephrotoxic  meds -BMP in am  Leukocytosis Resolved. Likely stress response.  h/o hypertension  BP stable.  On home lasix, imdur and metoprolol.  -will hold lasix and imdur given AKI and syncope workup. -continue metoprolol at lower dose given hypotension   DMII Lab Results  Component Value Date   HGBA1C 7.4* 08/18/2013  -hold home glipizide -Lantus 10 units QHS -SSI  Chronic sinusitis This is an incidental finding on CT head.   -follow-up as outpatient    Mild hyponatremia Asymptomatic.  -monitor  VTE SCDs  Dispo: Disposition is deferred at this time, awaiting improvement of current medical problems. Anticipated discharge in approximately 2-3 day(s).   The patient does have a current PCP Charolotte Eke Powers) and does need an [facility] hospital follow-up appointment after discharge.   Signed: Jones Bales, MD 08/22/2013, 10:56 PM

## 2013-08-23 ENCOUNTER — Inpatient Hospital Stay (HOSPITAL_COMMUNITY): Payer: Medicare Other

## 2013-08-23 ENCOUNTER — Other Ambulatory Visit: Payer: Self-pay

## 2013-08-23 DIAGNOSIS — S271XXA Traumatic hemothorax, initial encounter: Secondary | ICD-10-CM | POA: Diagnosis not present

## 2013-08-23 DIAGNOSIS — J942 Hemothorax: Secondary | ICD-10-CM | POA: Diagnosis present

## 2013-08-23 DIAGNOSIS — R55 Syncope and collapse: Secondary | ICD-10-CM | POA: Diagnosis not present

## 2013-08-23 DIAGNOSIS — N179 Acute kidney failure, unspecified: Secondary | ICD-10-CM | POA: Diagnosis not present

## 2013-08-23 DIAGNOSIS — I214 Non-ST elevation (NSTEMI) myocardial infarction: Secondary | ICD-10-CM | POA: Diagnosis not present

## 2013-08-23 LAB — BASIC METABOLIC PANEL
BUN: 29 mg/dL — ABNORMAL HIGH (ref 6–23)
CO2: 21 mEq/L (ref 19–32)
Calcium: 8.3 mg/dL — ABNORMAL LOW (ref 8.4–10.5)
Chloride: 94 mEq/L — ABNORMAL LOW (ref 96–112)
Creatinine, Ser: 1.06 mg/dL (ref 0.50–1.35)
GFR calc Af Amer: 81 mL/min — ABNORMAL LOW (ref >90)
GFR calc non Af Amer: 70 mL/min — ABNORMAL LOW (ref >90)
Glucose, Bld: 183 mg/dL — ABNORMAL HIGH (ref 70–99)
Potassium: 4.4 mEq/L (ref 3.7–5.3)
Sodium: 128 mEq/L — ABNORMAL LOW (ref 137–147)

## 2013-08-23 LAB — GLUCOSE, CAPILLARY
Glucose-Capillary: 183 mg/dL — ABNORMAL HIGH (ref 70–99)
Glucose-Capillary: 164 mg/dL — ABNORMAL HIGH (ref 70–99)
Glucose-Capillary: 141 mg/dL — ABNORMAL HIGH (ref 70–99)
Glucose-Capillary: 149 mg/dL — ABNORMAL HIGH (ref 70–99)

## 2013-08-23 LAB — CBC WITH DIFFERENTIAL/PLATELET
Basophils Absolute: 0 10*3/uL (ref 0.0–0.1)
Basophils Relative: 0 % (ref 0–1)
Eosinophils Absolute: 0.2 10*3/uL (ref 0.0–0.7)
Eosinophils Relative: 2 % (ref 0–5)
HCT: 23.5 % — ABNORMAL LOW (ref 39.0–52.0)
Hemoglobin: 8.2 g/dL — ABNORMAL LOW (ref 13.0–17.0)
Lymphocytes Relative: 9 % — ABNORMAL LOW (ref 12–46)
Lymphs Abs: 1 10*3/uL (ref 0.7–4.0)
MCH: 29.8 pg (ref 26.0–34.0)
MCHC: 34.9 g/dL (ref 30.0–36.0)
MCV: 85.5 fL (ref 78.0–100.0)
Monocytes Absolute: 1 10*3/uL (ref 0.1–1.0)
Monocytes Relative: 9 % (ref 3–12)
Neutro Abs: 9 10*3/uL — ABNORMAL HIGH (ref 1.7–7.7)
Neutrophils Relative %: 80 % — ABNORMAL HIGH (ref 43–77)
Platelets: 246 10*3/uL (ref 150–400)
RBC: 2.75 MIL/uL — ABNORMAL LOW (ref 4.22–5.81)
RDW: 14.6 % (ref 11.5–15.5)
WBC: 11.3 10*3/uL — ABNORMAL HIGH (ref 4.0–10.5)

## 2013-08-23 LAB — TSH: TSH: 2.31 u[IU]/mL (ref 0.350–4.500)

## 2013-08-23 MED ORDER — SODIUM CHLORIDE 0.9 % IV SOLN
INTRAVENOUS | Status: AC
  Administered 2013-08-23: 1000 mL via INTRAVENOUS

## 2013-08-23 MED ORDER — DILTIAZEM LOAD VIA INFUSION
10.0000 mg | Freq: Once | INTRAVENOUS | Status: AC
  Administered 2013-08-25: 09:00:00 via INTRAVENOUS
  Filled 2013-08-23: qty 10

## 2013-08-23 MED ORDER — METOPROLOL TARTRATE 25 MG PO TABS
25.0000 mg | ORAL_TABLET | Freq: Two times a day (BID) | ORAL | Status: DC
  Administered 2013-08-23 – 2013-08-27 (×9): 25 mg via ORAL
  Filled 2013-08-23 (×12): qty 1

## 2013-08-23 MED ORDER — DILTIAZEM HCL 100 MG IV SOLR
5.0000 mg/h | INTRAVENOUS | Status: DC
  Administered 2013-08-23: 10 mg/h via INTRAVENOUS
  Administered 2013-08-23: 5 mg/h via INTRAVENOUS
  Administered 2013-08-24 (×3): 10 mg/h via INTRAVENOUS
  Filled 2013-08-23 (×7): qty 100

## 2013-08-23 NOTE — Progress Notes (Signed)
Nurse contacted pharmacy; Lexiscan administration scheduled at 1000 should only be administered during scan procedure by technician only.

## 2013-08-23 NOTE — Progress Notes (Signed)
Patient ID: Gerald Rosario, male   DOB: [birth year], 68 y.o.   MRN: [id number]    Subjective:  Denies SSCP, palpitations or Dyspnea Chest tube sore  Objective:  Filed Vitals:   08/23/13 0729 08/23/13 0800 08/23/13 0828 08/23/13 1008  BP:    130/64  Pulse:   102 107  Temp:      TempSrc:      Resp:   18   Height:      Weight:      SpO2: 94% 96%      Intake/Output from previous day:  Intake/Output Summary (Last 24 hours) at 08/23/13 1050 Last data filed at 08/23/13 1009  Gross per 24 hour  Intake   1173 ml  Output    470 ml  Net    703 ml    Physical Exam: Affect appropriate Chronically ill male  HEENT: normal Neck supple with no adenopathy JVP normal no bruits no thyromegaly Lungs left lung chest tube  Heart:  S1/S2 no murmur, no rub, gallop or click PMI normal Abdomen: benighn, BS positve, no tenderness, no AAA no bruit.  No HSM or HJR Distal pulses intact with no bruits No edema Neuro non-focal Skin warm and dry No muscular weakness   Lab Results: Basic Metabolic Panel:  Recent Labs  08/22/13 0425 08/23/13 0315  NA 130* 128*  K 4.9 4.4  CL 98 94*  CO2 23 21  GLUCOSE 173* 183*  BUN 23 29*  CREATININE 1.04 1.06  CALCIUM 8.1* 8.3*   CBC:  Recent Labs  08/22/13 0425 08/23/13 0315  WBC 9.7 11.3*  NEUTROABS 8.1* 9.0*  HGB 8.3* 8.2*  HCT 25.0* 23.5*  MCV 87.4 85.5  PLT 226 246   Cardiac Enzymes:  Recent Labs  08/21/13 0259  TROPONINI <0.30   Anemia Panel:  Recent Labs  08/22/13 0425  VITAMINB12 287  FOLATE 15.0  FERRITIN 169  TIBC 205*  IRON 13*  RETICCTPCT 2.8    Imaging: Imaging results have been reviewed and Dg Chest Port 1 View  08/23/2013   CLINICAL DATA:  Left chest tube  EXAM: PORTABLE CHEST - 1 VIEW  COMPARISON:  Prior chest x-ray 08/22/2013  FINDINGS: Left chest tube remains in place. No pneumothorax. Persistent left basilar opacity which may reflect a combination of pleural fluid and atelectasis or infiltrate.  Implanted loop recorder projects over the left chest. Stable cardiac and mediastinal contours. Atherosclerotic calcification present in the transverse aorta. Inspiratory volumes overall are low and there is mild right medial basilar atelectasis. No overt pulmonary edema. Mildly displaced left rib fractures are unchanged.  IMPRESSION: No significant interval change in the appearance of the chest. Left chest tube remains in position.   Electronically Signed   By: Jacqulynn Cadet M.D.   On: 08/23/2013 08:50   Dg Chest Port 1 View  08/22/2013   CLINICAL DATA:  Hemothorax  EXAM: PORTABLE CHEST - 1 VIEW  COMPARISON:  08/21/2013  FINDINGS: The cardiac shadow is stable. A left-sided pleural effusion is again identified and stable from the previous day. A left chest tube is again noted and stable as well. Underlying atelectasis is likely present. A few left rib fractures are again noted. No pneumothorax is seen.  IMPRESSION: Posttraumatic changes in the left chest with evidence of pleural effusion. A chest tube is in place. No significant change from the previous day is noted.   Electronically Signed   By: Inez Catalina M.D.   On: 08/22/2013 07:54  Dg Chest Port 1 View  08/21/2013   CLINICAL DATA:  Chest tube placement.  EXAM: PORTABLE CHEST - 1 VIEW  COMPARISON:  08/21/2013 at 6:21 a.m.  FINDINGS: Interval placement of a left-sided chest tube with tip over the upper thorax in adequate position. Lungs are somewhat hypoinflated with mild interval improvement in a moderate to large left pleural fluid collection likely with associated compressive atelectasis. No definite pneumothorax. Remainder of the exam is unchanged to include several acute left posterior and lateral rib fractures.  IMPRESSION: Minimal improvement in patient's moderate to large left pleural fluid collection likely with associated compressive atelectasis post left chest tube placement. No definite pneumothorax seen.  Known multiple acute left rib  fractures.   Electronically Signed   By: Marin Olp M.D.   On: 08/21/2013 13:16    Cardiac Studies:  ECG:    Telemetry:  afib rates 100-115  Echo:  EF 60-65% moderate LAE   Medications:   . aspirin EC  81 mg Oral Daily  . atorvastatin  40 mg Oral q1800  . bacitracin   Topical BID  . diltiazem  10 mg Intravenous Once  . feeding supplement (GLUCERNA SHAKE)  237 mL Oral BID BM  . insulin aspart  0-9 Units Subcutaneous TID WC  . insulin glargine  10 Units Subcutaneous QHS  . methocarbamol  1,500 mg Oral QID  . metoprolol tartrate  12.5 mg Oral BID  . mometasone-formoterol  2 puff Inhalation BID  . pantoprazole  40 mg Oral Daily  . polyethylene glycol  17 g Oral Daily  . regadenoson  0.4 mg Intravenous Once  . sodium chloride  3 mL Intravenous Q12H  . Tdap  0.5 mL Intramuscular Once  . traMADol  100 mg Oral 4 times per day     . diltiazem (CARDIZEM) infusion 5 mg/hr (08/23/13 0656)    Assessment/Plan:  Afib:  No heparin due to hemothorax  Increase metroprolol for rate control CAD:  Known severe 3VD Dr Irish Lack and Aquilla Hacker notes indicate myovue but not done yet ? Due to chest tube Being in way and now afib.  Known 3VD with occluded circumflex and LAD not clear to me why cath wouldn't Be better as myovue is going to be abnormal.  EF has recovered nicely since 2012  Will wait for chest tube To be removed to further assess CAD Syncope:  Etiology not clear.  ILR placed by EP.  Previous EP study by Caryl Comes not inducable   Josue Hector 08/23/2013, 10:50 AM

## 2013-08-23 NOTE — Progress Notes (Signed)
3 Days Post-Op  Subjective: PT feels better today.  Amb with PT  Objective: Vital signs in last 24 hours: Temp:  [97.3 F (36.3 C)-98 F (36.7 C)] 97.9 F (36.6 C) (05/23 0728) Pulse Rate:  [83-120] 108 (05/23 0728) Resp:  [8-21] 15 (05/23 0728) BP: (109-142)/(60-69) 120/67 mmHg (05/23 0728) SpO2:  [94 %-100 %] 94 % (05/23 0729) Weight:  [189 lb 9.5 oz (86 kg)] 189 lb 9.5 oz (86 kg) (05/23 0500) Last BM Date: 08/17/13  Intake/Output from previous day: 05/22 0701 - 05/23 0700 In: 1133 [P.O.:1130; I.V.:3] Out: 470 [Urine:350; Chest Tube:120] Intake/Output this shift:    General appearance: alert and cooperative Resp: coarse L BS Cardio: tachy, RR GI: soft, non-tender; bowel sounds normal; no masses,  no organomegaly  Lab Results:   Recent Labs  08/22/13 0425 08/23/13 0315  WBC 9.7 11.3*  HGB 8.3* 8.2*  HCT 25.0* 23.5*  PLT 226 246   BMET  Recent Labs  08/22/13 0425 08/23/13 0315  NA 130* 128*  K 4.9 4.4  CL 98 94*  CO2 23 21  GLUCOSE 173* 183*  BUN 23 29*  CREATININE 1.04 1.06  CALCIUM 8.1* 8.3*   PT/INR No results found for this basename: LABPROT, INR,  in the last 72 hours ABG No results found for this basename: PHART, PCO2, PO2, HCO3,  in the last 72 hours  Studies/Results: Dg Chest Port 1 View  08/22/2013   CLINICAL DATA:  Hemothorax  EXAM: PORTABLE CHEST - 1 VIEW  COMPARISON:  08/21/2013  FINDINGS: The cardiac shadow is stable. A left-sided pleural effusion is again identified and stable from the previous day. A left chest tube is again noted and stable as well. Underlying atelectasis is likely present. A few left rib fractures are again noted. No pneumothorax is seen.  IMPRESSION: Posttraumatic changes in the left chest with evidence of pleural effusion. A chest tube is in place. No significant change from the previous day is noted.   Electronically Signed   By: Inez Catalina M.D.   On: 08/22/2013 07:54   Dg Chest Port 1 View  08/21/2013   CLINICAL  DATA:  Chest tube placement.  EXAM: PORTABLE CHEST - 1 VIEW  COMPARISON:  08/21/2013 at 6:21 a.m.  FINDINGS: Interval placement of a left-sided chest tube with tip over the upper thorax in adequate position. Lungs are somewhat hypoinflated with mild interval improvement in a moderate to large left pleural fluid collection likely with associated compressive atelectasis. No definite pneumothorax. Remainder of the exam is unchanged to include several acute left posterior and lateral rib fractures.  IMPRESSION: Minimal improvement in patient's moderate to large left pleural fluid collection likely with associated compressive atelectasis post left chest tube placement. No definite pneumothorax seen.  Known multiple acute left rib fractures.   Electronically Signed   By: Marin Olp M.D.   On: 08/21/2013 13:16    Anti-infectives: Anti-infectives   Start     Dose/Rate Route Frequency Ordered Stop   08/18/13 0645  ceFAZolin (ANCEF) IVPB 1 g/50 mL premix     1 g 100 mL/hr over 30 Minutes Intravenous  Once 08/18/13 4782 08/18/13 9562      Assessment/Plan: Fall  Multiple left rib fxs/hemothorax -- Pulmonary toilet, L CT placed 5/21, continue CT to suction today, CXR-pending today and will repeat in AM Back lacerations -- Local care ABL anemia -- due to L HTX Syncope  Multiple medical problems -- per primary service, stress test P  LOS: 5 days    Ralene Ok 08/23/2013

## 2013-08-23 NOTE — Progress Notes (Signed)
Pt HR up to 140's. Vital signs stable. Asymptomatic. EKG obtained showed A-fib with RVR. Pt denies pain but given scheduled tramadol dose. HR maintained in 130's to 140's. Dr. Christa See notified. Orders for diltiazem received. Will continue to monitor.  Ladell Heads, RN

## 2013-08-23 NOTE — Progress Notes (Signed)
Subjective: Feeling better today, states that his left sided CP has improved now, with 2/10 in intensity at rest and increasing to 6/10 with movement, it was 9/10 before with movement.Still didn't have any bowel movement although feels full. Denies any abdominal pain,N/V.new CP, palpitation, SOB. Still anorexic but trying to drink little bit. Objective: Vital signs in last 24 hours: Filed Vitals:   08/23/13 0729 08/23/13 0800 08/23/13 0828 08/23/13 1008  BP:    130/64  Pulse:   102 107  Temp:      TempSrc:      Resp:   18   Height:      Weight:      SpO2: 94% 96%     Weight change: 1.9 kg (4 lb 3 oz)  Intake/Output Summary (Last 24 hours) at 08/23/13 1115 Last data filed at 08/23/13 1009  Gross per 24 hour  Intake   1173 ml  Output    470 ml  Net    703 ml  EXAM:  GENERAL: Alert and oriented , comfortable. HEENT: PERRL, EOMI  NECK: Supple, no JVD, no adenopathy  LUNG: Decreased breath sound at left with some basal crackles, improved air entry as compare to before. HEART: RRR, S1 and S@, no MRG  ABD: S/ND/NT. No HSM. BS +  NEURO;A & O X 3. Moving all extremities, no gross neuro. Deficit  EXT: no edema, cyanosis, PP 2+ B/L Lab Results: @LABTEST2 @ Micro Results: Recent Results (from the past 240 hour(s))  MRSA PCR SCREENING     Status: None   Collection Time    08/18/13  4:00 PM      Result Value Ref Range Status   MRSA by PCR NEGATIVE  NEGATIVE Final   Comment:            The GeneXpert MRSA Assay (FDA     approved for NASAL specimens     only), is one component of a     comprehensive MRSA colonization     surveillance program. It is not     intended to diagnose MRSA     infection nor to guide or     monitor treatment for     MRSA infections.   Studies/Results: Dg Chest Port 1 View  08/23/2013   CLINICAL DATA:  Left chest tube  EXAM: PORTABLE CHEST - 1 VIEW  COMPARISON:  Prior chest x-ray 08/22/2013  FINDINGS: Left chest tube remains in place. No pneumothorax.  Persistent left basilar opacity which may reflect a combination of pleural fluid and atelectasis or infiltrate. Implanted loop recorder projects over the left chest. Stable cardiac and mediastinal contours. Atherosclerotic calcification present in the transverse aorta. Inspiratory volumes overall are low and there is mild right medial basilar atelectasis. No overt pulmonary edema. Mildly displaced left rib fractures are unchanged.  IMPRESSION: No significant interval change in the appearance of the chest. Left chest tube remains in position.   Electronically Signed   By: Jacqulynn Cadet M.D.   On: 08/23/2013 08:50   Dg Chest Port 1 View  08/22/2013   CLINICAL DATA:  Hemothorax  EXAM: PORTABLE CHEST - 1 VIEW  COMPARISON:  08/21/2013  FINDINGS: The cardiac shadow is stable. A left-sided pleural effusion is again identified and stable from the previous day. A left chest tube is again noted and stable as well. Underlying atelectasis is likely present. A few left rib fractures are again noted. No pneumothorax is seen.  IMPRESSION: Posttraumatic changes in the left chest with evidence of  pleural effusion. A chest tube is in place. No significant change from the previous day is noted.   Electronically Signed   By: Inez Catalina M.D.   On: 08/22/2013 07:54   Dg Chest Port 1 View  08/21/2013   CLINICAL DATA:  Chest tube placement.  EXAM: PORTABLE CHEST - 1 VIEW  COMPARISON:  08/21/2013 at 6:21 a.m.  FINDINGS: Interval placement of a left-sided chest tube with tip over the upper thorax in adequate position. Lungs are somewhat hypoinflated with mild interval improvement in a moderate to large left pleural fluid collection likely with associated compressive atelectasis. No definite pneumothorax. Remainder of the exam is unchanged to include several acute left posterior and lateral rib fractures.  IMPRESSION: Minimal improvement in patient's moderate to large left pleural fluid collection likely with associated  compressive atelectasis post left chest tube placement. No definite pneumothorax seen.  Known multiple acute left rib fractures.   Electronically Signed   By: Marin Olp M.D.   On: 08/21/2013 13:16   Medications: medication reviewed Scheduled Meds: . aspirin EC  81 mg Oral Daily  . atorvastatin  40 mg Oral q1800  . bacitracin   Topical BID  . diltiazem  10 mg Intravenous Once  . feeding supplement (GLUCERNA SHAKE)  237 mL Oral BID BM  . insulin aspart  0-9 Units Subcutaneous TID WC  . insulin glargine  10 Units Subcutaneous QHS  . methocarbamol  1,500 mg Oral QID  . metoprolol tartrate  25 mg Oral BID  . mometasone-formoterol  2 puff Inhalation BID  . pantoprazole  40 mg Oral Daily  . polyethylene glycol  17 g Oral Daily  . regadenoson  0.4 mg Intravenous Once  . sodium chloride  3 mL Intravenous Q12H  . Tdap  0.5 mL Intramuscular Once  . traMADol  100 mg Oral 4 times per day   Continuous Infusions: . diltiazem (CARDIZEM) infusion 5 mg/hr (08/23/13 0656)   PRN Meds:.acetaminophen, calcium carbonate, ondansetron (ZOFRAN) IV, oxyCODONE Assessment/Plan: A FIB: Had episode of A fib early this morning, asymptomatic, new finding, cardiology was called and they put him on Cardizem drip 5mg /hr. He was still having tachycardia with PR 107- 110, although asymptomatic , can increase his metoprolol dose for a better rate control as advice by cardiology. Avoid anticoagulation due to his current hemothorax. Continue monitoring him.  LEFT HEMOTHORAX: Patient had a chest tube placed 2 days ago,still draining bloody fluid, about 120cc since yesterday, His Hb was 8.2 today which seems stable with 8.3 yesterday,continue monitor his drainage, vitals and CBC , if there is more drop, might need another blood transfusion. . Repeat CXR and CBC tomorrow. :  SYNCOPE: Pt. Was feeling better this morning. Chest tightness and pain has improved after chest tube placement. His Na and CL is little low today with Na  of 128 which is down from 136 on admission, with little down ward trend on his previous BMP . Can give him some 0.9% saline as he is not taking much Po, carefully monitor for any sign of CHF and repeat BMP tomorrow. :NSTEMI: His troponin has normalized,mightt had demand ischemia after that initial episode of syncope. Cardialogy is following, had his LINQ recorded implanted ,   stress test today  was cancelled due to his chest tube. No CP, keep him on telemetry.  RIB FRACTURE: Still having some pain esp. With any movement, his chest discomfort has decreased after the CT placement. Continue with pain management and PT/OT to help  him heel.   BACK LACERATION:Continue with wound care.  :SCALP LACERATION: Healing today, keep monitor for any sign of infection  DM: His blood CBG is still little higher Novolog and lantus 10 unit was started 2 days ago, monitor his response. Can try moderate sliding scale. HTN: HIs BP is 109/65  today, much improved, keep monitoring.  :URINARY RETENTION: He was unable to void during his hospital stay, may be due to pain meds. And his BPH combined, had foley's in since 3 days. Reassess tomorrow to see if he can void now.  :ANOREXIA: Still not eating well, says don't really feel like eating, He was on soft diet, no N/V , added some Glucerna yesterday was able to tolerate that although he states that makes his BS goes up, continue with that to provide some nutrition and BS can be monitored and addressed accordingly and can give him some more fluid today which will help normalize his Na,and help with tachycardia as dehydration can also cause tachy.  CONSTIPATION:We started  miralax yesterday, still no bowel movement, continue with that and see the response.    This is a Careers information officer Note.  The care of the patient was discussed with Dr. Gordy Levan and the assessment and plan formulated with their assistance.  Please see their attached note for official documentation of the daily  encounter.   LOS: 5 days   Lorella Nimrod, Med Student 08/23/2013, 11:15 AM

## 2013-08-23 NOTE — Progress Notes (Signed)
Subjective:   Pt went into a-fib overnight with HR 120s-130s.  Pt had no CP, SOB, palpitations.  Pt feeling better this AM in terms of his left chest pain after thoracentesis and reports pain 2/10 without exertion.  With exertion pain goes up to a 7-8/10.  Was given a dose of miralax last PM but still no BM.   Hgb stable at 8.2 s/p 1 unit of prbcs.  He is down 1.7L since admission.  Chest tube output 149ml yesterday.    Objective:   Vital signs in last 24 hours: Filed Vitals:   08/23/13 0729 08/23/13 0800 08/23/13 0828 08/23/13 1008  BP:    130/64  Pulse:   102 107  Temp:      TempSrc:      Resp:   18   Height:      Weight:      SpO2: 94% 96%      Weight: Filed Weights   08/21/13 0400 08/22/13 0500 08/23/13 0500  Weight: 184 lb 15.5 oz (83.9 kg) 185 lb 6.5 oz (84.1 kg) 189 lb 9.5 oz (86 kg)    Ins/Outs:  Intake/Output Summary (Last 24 hours) at 08/23/13 1112 Last data filed at 08/23/13 1009  Gross per 24 hour  Intake   1173 ml  Output    470 ml  Net    703 ml    Physical Exam: Constitutional: Vital signs reviewed.  Patient is sitting up in bed left chest tube in place.   HEENT: [place]/AT; PERRL, EOMI, conjunctivae normal, no scleral icterus  Cardiovascular: RRR, no MRG Pulmonary/Chest: normal respiratory effort, no accessory muscle use, left chest tube in place; without rales, wheezes, or rhonchi Abdominal: Soft. +BS, NT/ND Neurological: A&O x3, CN II-XII grossly intact; non-focal exam Musculoskeletal: 15cm laceration R upper back at approximately the T3 level.   Abrasions noted left mid thoracic and back region with TTP. No midline spinal tenderness noted.  Extremities: 2+DP b/l, no C/C/E  Skin: Warm, dry.    Lab Results:  BMP:  Recent Labs Lab 08/18/13 0658 08/18/13 1345  08/22/13 0425 08/23/13 0315  NA 139  --   < > 130* 128*  K 4.8  --   < > 4.9 4.4  CL 105  --   < > 98 94*  CO2  --   --   < > 23 21  GLUCOSE 178*  --   < > 173* 183*  BUN 21  --   < >  23 29*  CREATININE 1.60*  1.70*  --   < > 1.04 1.06  CALCIUM  --   --   < > 8.1* 8.3*  MG  --  2.0  --   --   --   < > = values in this interval not displayed. Anion Gap:  13  CBC:  Recent Labs Lab 08/22/13 0425 08/23/13 0315  WBC 9.7 11.3*  NEUTROABS 8.1* 9.0*  HGB 8.3* 8.2*  HCT 25.0* 23.5*  MCV 87.4 85.5  PLT 226 246    Coagulation:  Recent Labs Lab 08/18/13 1345  LABPROT 14.1  INR 1.11    CBG:            Recent Labs Lab 08/21/13 2130 08/22/13 0813 08/22/13 1143 08/22/13 1640 08/22/13 2125 08/23/13 0730  GLUCAP 187* 161* 163* 203* 184* 183*           HA1C:       Recent Labs Lab 08/18/13 1345  HGBA1C 7.4*  Lipid Panel:  Recent Labs Lab 08/20/13 0433  CHOL 165  HDL 63  LDLCALC 79  TRIG 113  CHOLHDL 2.6    LFTs:  Recent Labs Lab 08/18/13 1345  AST 32  ALT 17  ALKPHOS 50  BILITOT 0.8  PROT 7.1  ALBUMIN 3.5    Pancreatic Enzymes: No results found for this basename: LIPASE, AMYLASE,  in the last 168 hours  Lactic Acid/Procalcitonin:  Recent Labs Lab 08/18/13 0643 08/18/13 1345  LATICACIDVEN 2.4* 2.0    Ammonia: No results found for this basename: AMMONIA,  in the last 168 hours  Cardiac Enzymes:  Recent Labs Lab 08/18/13 1345  08/19/13 2247 08/20/13 0433 08/20/13 1010 08/21/13 0259  CKTOTAL 537*  --   --  352*  --   --   CKMB 7.0*  --   --   --   --   --   TROPONINI 0.32*  < > 0.59*  --  0.36* <0.30  < > = values in this interval not displayed.  EKG: EKG Interpretation  Date/Time:  Monday Aug 18 2013 04:59:50 EDT Ventricular Rate:  78 PR Interval:  169 QRS Duration: 107 QT Interval:  394 QTC Calculation: 449 R Axis:   6 Text Interpretation:  Sinus rhythm Anterior infarct, old ED PHYSICIAN INTERPRETATION AVAILABLE IN [facility] [place] Confirmed by TEST, Record ([id number]) on 08/20/2013 7:15:39 AM   BNP: No results found for this basename: PROBNP,  in the last 168 hours  D-Dimer: No results found for  this basename: DDIMER,  in the last 168 hours  Urinalysis:  Recent Labs Lab 08/18/13 1200  COLORURINE YELLOW  LABSPEC 1.014  PHURINE 5.0  GLUCOSEU NEGATIVE  HGBUR NEGATIVE  BILIRUBINUR NEGATIVE  KETONESUR 15*  PROTEINUR NEGATIVE  UROBILINOGEN 0.2  NITRITE NEGATIVE  LEUKOCYTESUR TRACE*    Micro Results: Recent Results (from the past 240 hour(s))  MRSA PCR SCREENING     Status: None   Collection Time    08/18/13  4:00 PM      Result Value Ref Range Status   MRSA by PCR NEGATIVE  NEGATIVE Final   Comment:            The GeneXpert MRSA Assay (FDA     approved for NASAL specimens     only), is one component of a     comprehensive MRSA colonization     surveillance program. It is not     intended to diagnose MRSA     infection nor to guide or     monitor treatment for     MRSA infections.    Blood Culture: No results found for this basename: sdes,  specrequest,  cult,  reptstatus    Studies/Results: Dg Chest Port 1 View  08/23/2013   CLINICAL DATA:  Left chest tube  EXAM: PORTABLE CHEST - 1 VIEW  COMPARISON:  Prior chest x-ray 08/22/2013  FINDINGS: Left chest tube remains in place. No pneumothorax. Persistent left basilar opacity which may reflect a combination of pleural fluid and atelectasis or infiltrate. Implanted loop recorder projects over the left chest. Stable cardiac and mediastinal contours. Atherosclerotic calcification present in the transverse aorta. Inspiratory volumes overall are low and there is mild right medial basilar atelectasis. No overt pulmonary edema. Mildly displaced left rib fractures are unchanged.  IMPRESSION: No significant interval change in the appearance of the chest. Left chest tube remains in position.   Electronically Signed   By: Jacqulynn Cadet M.D.   On: 08/23/2013 08:50  Dg Chest Port 1 View  08/22/2013   CLINICAL DATA:  Hemothorax  EXAM: PORTABLE CHEST - 1 VIEW  COMPARISON:  08/21/2013  FINDINGS: The cardiac shadow is stable. A  left-sided pleural effusion is again identified and stable from the previous day. A left chest tube is again noted and stable as well. Underlying atelectasis is likely present. A few left rib fractures are again noted. No pneumothorax is seen.  IMPRESSION: Posttraumatic changes in the left chest with evidence of pleural effusion. A chest tube is in place. No significant change from the previous day is noted.   Electronically Signed   By: Inez Catalina M.D.   On: 08/22/2013 07:54   Dg Chest Port 1 View  08/21/2013   CLINICAL DATA:  Chest tube placement.  EXAM: PORTABLE CHEST - 1 VIEW  COMPARISON:  08/21/2013 at 6:21 a.m.  FINDINGS: Interval placement of a left-sided chest tube with tip over the upper thorax in adequate position. Lungs are somewhat hypoinflated with mild interval improvement in a moderate to large left pleural fluid collection likely with associated compressive atelectasis. No definite pneumothorax. Remainder of the exam is unchanged to include several acute left posterior and lateral rib fractures.  IMPRESSION: Minimal improvement in patient's moderate to large left pleural fluid collection likely with associated compressive atelectasis post left chest tube placement. No definite pneumothorax seen.  Known multiple acute left rib fractures.   Electronically Signed   By: Marin Olp M.D.   On: 08/21/2013 13:16    Medications:  Scheduled Meds: . aspirin EC  81 mg Oral Daily  . atorvastatin  40 mg Oral q1800  . bacitracin   Topical BID  . diltiazem  10 mg Intravenous Once  . feeding supplement (GLUCERNA SHAKE)  237 mL Oral BID BM  . insulin aspart  0-9 Units Subcutaneous TID WC  . insulin glargine  10 Units Subcutaneous QHS  . methocarbamol  1,500 mg Oral QID  . metoprolol tartrate  25 mg Oral BID  . mometasone-formoterol  2 puff Inhalation BID  . pantoprazole  40 mg Oral Daily  . polyethylene glycol  17 g Oral Daily  . regadenoson  0.4 mg Intravenous Once  . sodium chloride  3 mL  Intravenous Q12H  . Tdap  0.5 mL Intramuscular Once  . traMADol  100 mg Oral 4 times per day   Continuous Infusions: . diltiazem (CARDIZEM) infusion 5 mg/hr (08/23/13 0656)   PRN Meds: acetaminophen, calcium carbonate, ondansetron (ZOFRAN) IV, oxyCODONE  Antibiotics: Antibiotics Given (last 72 hours)   None      Day of Hospitalization: 5  Consults: Treatment Team:  Trauma Md, MD Rounding Lbcardiology, MD  Assessment/Plan:   Principal Problem:   Syncope Active Problems:   HYPERTENSION   Coronary artery disease   Ischemic cardiomyopathy   Diabetes mellitus   Hyperkalemia   Chronic systolic heart failure   Multiple fractures of ribs of left side   Laceration of skin of scalp   Hematoma of occipital surface of head   Chronic sinusitis   Laceration of skin   NSTEMI (non-ST elevated myocardial infarction)   Pleural effusion   Urine retention   Syncope and collapse   Pleural effusion, left  Acute atrial fibrillation with RVR  Apparently pt went into a-fib early this AM with HR up to 140s; was asymptomatic.  Cardiology was consulted with orders for diltiazem gtt.  HR currently in low 100s and asymptomatic.   -cards following, increased metoprolol for rate control  to 25mg  bid -no heparin gtt per cards  Multiple segmental rib fractures at risk for flail chest s/p CXR showed large left pleural effusion--thoracentesis with hemothorax and >1000cc. -pain management -pulmonary toilet/IS  Syncope  Stable.  Trops trended down.  EP consulted and placed implantable loop recorder.  5/19 echo: LVEF 55-60% with LA dilation but unchanged from 2013 echo.   -appreciate EP recs  NSTEMI Stable.  Trops trended down.  Lipid panel OK.   -atorvastatin to 40mg  qd  Upper back skin laceration 15 cm laceration at risk for skin necrosis.  Trauma consulted in ED and laceration repaired by trama service. -monitor  Left suboccipital scalp hematoma and  laceration -monitor  AKI Resolved.  Baseline is 1.1-1.2.  He was noted to have mild elevated Cr at 1.7 on admission. CK trending down.  -hold lasix -avoid nephrotoxic meds -BMP in am  Leukocytosis Likely stress response.  h/o hypertension  BP stable.  On home lasix, imdur and metoprolol.  -will hold lasix and imdur given AKI and syncope workup. -continue metoprolol at lower dose given hypotension   DMII Lab Results  Component Value Date   HGBA1C 7.4* 08/18/2013    Recent Labs  08/22/13 1640 08/22/13 2125 08/23/13 0730  GLUCAP 203* 184* 183*  -hold home glipizide -Lantus 10 units QHS -SSI  Chronic sinusitis This is an incidental finding on CT head.   -follow-up as outpatient    Mild hyponatremia Asymptomatic.  -gentle IVF @75ml /hr   Hyperkalemia Resolved.  No EKG changes.  Given kayexalate x 2 without any BM.  -BMP in AM  VTE SCDs  Dispo: Disposition is deferred at this time, awaiting improvement of current medical problems. Anticipated discharge in approximately 2-3 day(s).   The patient does have a current PCP Charolotte Eke Powers) and does need an [facility] hospital follow-up appointment after discharge.   Signed: Jones Bales, MD 08/23/2013, 11:12 AM

## 2013-08-23 NOTE — Progress Notes (Signed)
I repeated the critical or key portions of the exam.  I confirmed/revised the medical student's history, exam, assessment and plan.  Please see my note for further details.  

## 2013-08-23 NOTE — Progress Notes (Signed)
Agree with student Dr. Reesa Chew note see Dr. Charlyne Petrin note for further details  Aundra Dubin MD

## 2013-08-23 NOTE — Progress Notes (Signed)
Brief X-Cover Note  Patient went into atrial fibrillation, potential new diagnosis, asymptomatic with HR 120s-130s. Vitals reviewed; BP 109/65  Pulse 120  Temp(Src) 97.9 F (36.6 C) (Axillary)  Resp 13  Ht 5\' 7"  (1.702 m)  Wt 84.1 kg (185 lb 6.5 oz)  BMI 29.03 kg/m2  SpO2 96%. Will watch closely. Has Chest Tube and recent ? Hemothorax so will defer heparin gtt to primary team in AM.   Jules Husbands, MD

## 2013-08-24 ENCOUNTER — Inpatient Hospital Stay (HOSPITAL_COMMUNITY): Payer: Medicare Other

## 2013-08-24 ENCOUNTER — Encounter (HOSPITAL_COMMUNITY): Payer: Self-pay | Admitting: Thoracic Surgery (Cardiothoracic Vascular Surgery)

## 2013-08-24 DIAGNOSIS — J90 Pleural effusion, not elsewhere classified: Secondary | ICD-10-CM

## 2013-08-24 LAB — BASIC METABOLIC PANEL
BUN: 28 mg/dL — ABNORMAL HIGH (ref 6–23)
CO2: 21 mEq/L (ref 19–32)
Calcium: 8.4 mg/dL (ref 8.4–10.5)
Chloride: 96 mEq/L (ref 96–112)
Creatinine, Ser: 1.05 mg/dL (ref 0.50–1.35)
GFR calc Af Amer: 82 mL/min — ABNORMAL LOW (ref >90)
GFR calc non Af Amer: 71 mL/min — ABNORMAL LOW (ref >90)
Glucose, Bld: 186 mg/dL — ABNORMAL HIGH (ref 70–99)
Potassium: 4.9 mEq/L (ref 3.7–5.3)
Sodium: 130 mEq/L — ABNORMAL LOW (ref 137–147)

## 2013-08-24 LAB — CBC
HCT: 23.4 % — ABNORMAL LOW (ref 39.0–52.0)
Hemoglobin: 8 g/dL — ABNORMAL LOW (ref 13.0–17.0)
MCH: 29.3 pg (ref 26.0–34.0)
MCHC: 34.2 g/dL (ref 30.0–36.0)
MCV: 85.7 fL (ref 78.0–100.0)
Platelets: 285 10*3/uL (ref 150–400)
RBC: 2.73 MIL/uL — ABNORMAL LOW (ref 4.22–5.81)
RDW: 14.8 % (ref 11.5–15.5)
WBC: 13 10*3/uL — ABNORMAL HIGH (ref 4.0–10.5)

## 2013-08-24 LAB — GLUCOSE, CAPILLARY
Glucose-Capillary: 171 mg/dL — ABNORMAL HIGH (ref 70–99)
Glucose-Capillary: 158 mg/dL — ABNORMAL HIGH (ref 70–99)
Glucose-Capillary: 178 mg/dL — ABNORMAL HIGH (ref 70–99)
Glucose-Capillary: 164 mg/dL — ABNORMAL HIGH (ref 70–99)

## 2013-08-24 MED ORDER — IOHEXOL 300 MG/ML  SOLN
80.0000 mL | Freq: Once | INTRAMUSCULAR | Status: AC | PRN
  Administered 2013-08-24: 80 mL via INTRAVENOUS

## 2013-08-24 MED ORDER — SENNOSIDES-DOCUSATE SODIUM 8.6-50 MG PO TABS
1.0000 | ORAL_TABLET | Freq: Two times a day (BID) | ORAL | Status: AC
  Administered 2013-08-24 (×2): 1 via ORAL
  Filled 2013-08-24 (×2): qty 1

## 2013-08-24 NOTE — Progress Notes (Signed)
4 Days Post-Op  Subjective: Pt with no acute complaints. CT redressed yesterday  Objective: Vital signs in last 24 hours: Temp:  [97.7 F (36.5 C)-98.6 F (37 C)] 97.8 F (36.6 C) (05/24 0400) Pulse Rate:  [85-120] 86 (05/24 0400) Resp:  [14-23] 16 (05/24 0400) BP: (114-139)/(52-67) 115/58 mmHg (05/24 0400) SpO2:  [93 %-99 %] 97 % (05/24 0400) Last BM Date: 08/17/13 (intervention in progress)  Intake/Output from previous day: 05/23 0701 - 05/24 0700 In: 921 [P.O.:240; I.V.:681] Out: 1305 [Urine:1275; Chest Tube:30] Intake/Output this shift: Total I/O In: 43 [I.V.:43] Out: 430 [Urine:400; Chest Tube:30]  General appearance: alert and cooperative Resp: coarse L BS Cardio: regular rate and rhythm, S1, S2 normal, no murmur, click, rub or gallop GI: soft, non-tender; bowel sounds normal; no masses,  no organomegaly  Lab Results:   Recent Labs  08/23/13 0315 08/24/13 0317  WBC 11.3* 13.0*  HGB 8.2* 8.0*  HCT 23.5* 23.4*  PLT 246 285   BMET  Recent Labs  08/23/13 0315 08/24/13 0317  NA 128* 130*  K 4.4 4.9  CL 94* 96  CO2 21 21  GLUCOSE 183* 186*  BUN 29* 28*  CREATININE 1.06 1.05  CALCIUM 8.3* 8.4   PT/INR No results found for this basename: LABPROT, INR,  in the last 72 hours ABG No results found for this basename: PHART, PCO2, PO2, HCO3,  in the last 72 hours  Studies/Results: Dg Chest Port 1 View  08/23/2013   CLINICAL DATA:  Left chest tube  EXAM: PORTABLE CHEST - 1 VIEW  COMPARISON:  Prior chest x-ray 08/22/2013  FINDINGS: Left chest tube remains in place. No pneumothorax. Persistent left basilar opacity which may reflect a combination of pleural fluid and atelectasis or infiltrate. Implanted loop recorder projects over the left chest. Stable cardiac and mediastinal contours. Atherosclerotic calcification present in the transverse aorta. Inspiratory volumes overall are low and there is mild right medial basilar atelectasis. No overt pulmonary edema.  Mildly displaced left rib fractures are unchanged.  IMPRESSION: No significant interval change in the appearance of the chest. Left chest tube remains in position.   Electronically Signed   By: Jacqulynn Cadet M.D.   On: 08/23/2013 08:50    Anti-infectives: Anti-infectives   Start     Dose/Rate Route Frequency Ordered Stop   08/18/13 0645  ceFAZolin (ANCEF) IVPB 1 g/50 mL premix     1 g 100 mL/hr over 30 Minutes Intravenous  Once 08/18/13 5885 08/18/13 0277      Assessment/Plan: Fall  Multiple left rib fxs/hemothorax -- Pulmonary toilet, L CT placed 5/21, continue CT to suction today, Will order CT to eval hemothorax.  May require TCTS consult  Back lacerations -- Local care ABL anemia -- due to L HTX Syncope  Multiple medical problems -- per primary service, stress test P  LOS: 6 days    Ralene Ok 08/24/2013

## 2013-08-24 NOTE — Progress Notes (Signed)
Pt up to [facility] and while sitting there, the chest tube site began leaking serosanguinous drainage.  The dressing was saturated, the chest tube was still intact and the chest tube drainage system was intact, attached to suction and functioning properly. Pt status remained the unchanged, no SOB or dyspnea.  Notified attending, Dr. Corine Shelter, who advised to call Surgery.  Then spoke to Dr. Rosendo Gros and reported above.  He advised to change dressing.  New vaseline gauze to insertion site and covered with gauze and medipore tape.  Will continue to monitor.
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
## 2013-08-24 NOTE — Progress Notes (Signed)
Patient ID: Gerald Rosario, male   DOB: 03-23-1946, 68 y.o.   MRN: [id number]    Subjective:  Denies SSCP, palpitations or Dyspnea Chest tube sore  Objective:  Filed Vitals:   08/23/13 2001 08/23/13 2234 08/24/13 0000 08/24/13 0400  BP:  139/57 120/64 115/58  Pulse:  97 85 86  Temp:   98.6 F (37 C) 97.8 F (36.6 C)  TempSrc:   Oral Oral  Resp:   15 16  Height:      Weight:      SpO2: 98%  99% 97%    Intake/Output from previous day:  Intake/Output Summary (Last 24 hours) at 08/24/13 0740 Last data filed at 08/24/13 0400  Gross per 24 hour  Intake    921 ml  Output   1305 ml  Net   -384 ml    Physical Exam: Affect appropriate Chronically ill male  HEENT: normal Neck supple with no adenopathy JVP normal no bruits no thyromegaly Lungs left lung chest tube  With some drainage  Heart:  S1/S2 no murmur, no rub, gallop or click PMI normal Abdomen: benighn, BS positve, no tenderness, no AAA no bruit.  No HSM or HJR Distal pulses intact with no bruits No edema Neuro non-focal Skin warm and dry No muscular weakness   Lab Results: Basic Metabolic Panel:  Recent Labs  08/23/13 0315 08/24/13 0317  NA 128* 130*  K 4.4 4.9  CL 94* 96  CO2 21 21  GLUCOSE 183* 186*  BUN 29* 28*  CREATININE 1.06 1.05  CALCIUM 8.3* 8.4   CBC:  Recent Labs  08/22/13 0425 08/23/13 0315 08/24/13 0317  WBC 9.7 11.3* 13.0*  NEUTROABS 8.1* 9.0*  --   HGB 8.3* 8.2* 8.0*  HCT 25.0* 23.5* 23.4*  MCV 87.4 85.5 85.7  PLT 226 246 285   Cardiac Enzymes: No results found for this basename: CKTOTAL, CKMB, CKMBINDEX, TROPONINI,  in the last 72 hours Anemia Panel:  Recent Labs  08/22/13 0425  VITAMINB12 287  FOLATE 15.0  FERRITIN 169  TIBC 205*  IRON 13*  RETICCTPCT 2.8    Imaging: Imaging results have been reviewed and Dg Chest Port 1 View  08/23/2013   CLINICAL DATA:  Left chest tube  EXAM: PORTABLE CHEST - 1 VIEW  COMPARISON:  Prior chest x-ray 08/22/2013  FINDINGS:  Left chest tube remains in place. No pneumothorax. Persistent left basilar opacity which may reflect a combination of pleural fluid and atelectasis or infiltrate. Implanted loop recorder projects over the left chest. Stable cardiac and mediastinal contours. Atherosclerotic calcification present in the transverse aorta. Inspiratory volumes overall are low and there is mild right medial basilar atelectasis. No overt pulmonary edema. Mildly displaced left rib fractures are unchanged.  IMPRESSION: No significant interval change in the appearance of the chest. Left chest tube remains in position.   Electronically Signed   By: Jacqulynn Cadet M.D.   On: 08/23/2013 08:50    Cardiac Studies:  ECG:    Telemetry:  afib rates 100-115  Echo:  EF 60-65% moderate LAE   Medications:   . aspirin EC  81 mg Oral Daily  . atorvastatin  40 mg Oral q1800  . bacitracin   Topical BID  . diltiazem  10 mg Intravenous Once  . feeding supplement (GLUCERNA SHAKE)  237 mL Oral BID BM  . insulin aspart  0-9 Units Subcutaneous TID WC  . insulin glargine  10 Units Subcutaneous QHS  . methocarbamol  1,500 mg  Oral QID  . metoprolol tartrate  25 mg Oral BID  . mometasone-formoterol  2 puff Inhalation BID  . pantoprazole  40 mg Oral Daily  . polyethylene glycol  17 g Oral Daily  . regadenoson  0.4 mg Intravenous Once  . sodium chloride  3 mL Intravenous Q12H  . Tdap  0.5 mL Intramuscular Once  . traMADol  100 mg Oral 4 times per day     . diltiazem (CARDIZEM) infusion 10 mg/hr (08/24/13 0052)    Assessment/Plan:  Afib:  No heparin due to hemothorax  Increased metroprolol yesterday for better  for rate control continue cardizem drip  CAD:  Known severe 3VD Dr Irish Lack and Aquilla Hacker notes indicate myovue but not done yet ? Due to chest tube Being in way and now afib.  Known 3VD with occluded circumflex and LAD not clear to me why cath wouldn't Be better as myovue is going to be abnormal.  EF has recovered nicely since  2012  Will wait for chest tube To be removed to further assess CAD  Syncope:  Etiology not clear.  ILR placed by EP.  Previous EP study by Caryl Comes not inducable   Chol:  Continue statin   Having CT scan today to check on left hemothorax    Josue Hector 08/24/2013, 7:39 AM

## 2013-08-24 NOTE — Progress Notes (Signed)
Pt transported to CT alert and oriented.  Vital signs prior to transport documented in Epic.  Chest tube transported upright hanging on stable bed anchors for transport.  Chest tube site dressing has new drainage, surgeon aware of leakage around site.

## 2013-08-24 NOTE — Progress Notes (Signed)
Subjective: Pain is 2/10 today in rib area.  Denies sob.  He did not eat breakfast this am b/c someone took it before he had a chance to eat it.    IH:[id number] leaking from CT region x 2 today  Objective: Vital signs in last 24 hours: Filed Vitals:   08/24/13 0904 08/24/13 0945 08/24/13 1000 08/24/13 1158  BP: 124/64 128/62 146/69 119/60  Pulse: 92 91 93 78  Temp: 97.3 F (36.3 C)   98.2 F (36.8 C)  TempSrc: Oral   Oral  Resp: 13  15 11   Height:      Weight:      SpO2: 99%   98%   Weight change:   Intake/Output Summary (Last 24 hours) at 08/24/13 1347 Last data filed at 08/24/13 1300  Gross per 24 hour  Intake    531 ml  Output   2055 ml  Net  -1524 ml   Vitals reviewed. General: resting in bed, NAD HEENT: [place]/at, no scleral icterus Cardiac: AF rate controlled, no rubs, murmurs or gallops Pulm: clear to auscultation bilaterally anteriorly, left chest tube intact Abd: soft, nontender, nondistended, BS present Ext: warm and well perfused, no pedal edema Neuro: alert and oriented X3, cranial nerves II-XII grossly intact, moving all 4 extremities   Lab Results: Basic Metabolic Panel:  Recent Labs Lab 08/18/13 0658 08/18/13 1345  08/23/13 0315 08/24/13 0317  NA 139  --   < > 128* 130*  K 4.8  --   < > 4.4 4.9  CL 105  --   < > 94* 96  CO2  --   --   < > 21 21  GLUCOSE 178*  --   < > 183* 186*  BUN 21  --   < > 29* 28*  CREATININE 1.60*  1.70*  --   < > 1.06 1.05  CALCIUM  --   --   < > 8.3* 8.4  MG  --  2.0  --   --   --   < > = values in this interval not displayed. Liver Function Tests:  Recent Labs Lab 08/18/13 1345  AST 32  ALT 17  ALKPHOS 50  BILITOT 0.8  PROT 7.1  ALBUMIN 3.5   CBC:  Recent Labs Lab 08/22/13 0425 08/23/13 0315 08/24/13 0317  WBC 9.7 11.3* 13.0*  NEUTROABS 8.1* 9.0*  --   HGB 8.3* 8.2* 8.0*  HCT 25.0* 23.5* 23.4*  MCV 87.4 85.5 85.7  PLT 226 246 285   Cardiac Enzymes:  Recent Labs Lab 08/18/13 1345   08/19/13 2247 08/20/13 0433 08/20/13 1010 08/21/13 0259  CKTOTAL 537*  --   --  352*  --   --   CKMB 7.0*  --   --   --   --   --   TROPONINI 0.32*  < > 0.59*  --  0.36* <0.30  < > = values in this interval not displayed. CBG:  Recent Labs Lab 08/23/13 0730 08/23/13 1232 08/23/13 1639 08/23/13 2145 08/24/13 0903 08/24/13 1159  GLUCAP 183* 164* 141* 149* 171* 158*   Hemoglobin A1C:  Recent Labs Lab 08/18/13 1345  HGBA1C 7.4*   Fasting Lipid Panel:  Recent Labs Lab 08/20/13 0433  CHOL 165  HDL 63  LDLCALC 79  TRIG 113  CHOLHDL 2.6   Thyroid Function Tests:  Recent Labs Lab 08/23/13 1258  TSH 2.310   Coagulation:  Recent Labs Lab 08/18/13 1345  LABPROT 14.1  INR  1.11   Anemia Panel:  Recent Labs Lab 08/22/13 0425  VITAMINB12 287  FOLATE 15.0  FERRITIN 169  TIBC 205*  IRON 13*  RETICCTPCT 2.8   Urine Drug Screen: Drugs of Abuse     Component Value Date/Time   LABOPIA POSITIVE* 08/18/2013 1200   COCAINSCRNUR NONE DETECTED 08/18/2013 1200   LABBENZ NONE DETECTED 08/18/2013 1200   AMPHETMU NONE DETECTED 08/18/2013 1200   THCU NONE DETECTED 08/18/2013 1200   LABBARB NONE DETECTED 08/18/2013 1200    Alcohol Level:  Recent Labs Lab 08/18/13 0643  ETH <11   Urinalysis:  Recent Labs Lab 08/18/13 1200  COLORURINE YELLOW  LABSPEC 1.014  PHURINE 5.0  GLUCOSEU NEGATIVE  HGBUR NEGATIVE  BILIRUBINUR NEGATIVE  KETONESUR 15*  PROTEINUR NEGATIVE  UROBILINOGEN 0.2  NITRITE NEGATIVE  LEUKOCYTESUR TRACE*   Misc. Labs: MMA  Micro Results: Recent Results (from the past 240 hour(s))  MRSA PCR SCREENING     Status: None   Collection Time    08/18/13  4:00 PM      Result Value Ref Range Status   MRSA by PCR NEGATIVE  NEGATIVE Final   Comment:            The GeneXpert MRSA Assay (FDA     approved for NASAL specimens     only), is one component of a     comprehensive MRSA colonization     surveillance program. It is not     intended to  diagnose MRSA     infection nor to guide or     monitor treatment for     MRSA infections.   Studies/Results: Ct Chest W Contrast  08/24/2013   CLINICAL DATA:  Syncopal episode with fall resulting in multiple rib fractures and left hemothorax.  EXAM: CT CHEST WITH CONTRAST  TECHNIQUE: Multidetector CT imaging of the chest was performed during intravenous contrast administration.  CONTRAST:  37mL OMNIPAQUE IOHEXOL 300 MG/ML  SOLN  COMPARISON:  Radiographs 08/24/2013 and 08/23/2013.  CT 08/18/2013.  FINDINGS: The mediastinum has a stable appearance. There is no evidence of mediastinal hematoma. Atherosclerosis of the aorta, great vessels and coronary arteries is noted. Minimal heterogeneity of the thyroid gland appear stable.  Anteriorly inserted left-sided chest tube is likely within the major fissure. There is increased volume loss in the left hemithorax with subtotal collapse of the left lower lobe. There are associated air bronchograms. There is patchy atelectasis within the left upper lobe. In addition, patchy peripheral ground-glass opacities are present throughout the right lung. These may reflect atelectasis or contusion.  There is complex medium density fluid posteriorly in the left hemithorax consistent with hemothorax. This is primarily posterior and inferior to the tip of the chest tube. There is no significant pleural fluid on the right. There is no pericardial effusion or pneumothorax.  Multiple segmental left-sided rib fractures are again noted. The posterior components remain mildly displaced. There is no evidence of vertebral body or sternal fracture.  The visualized upper abdomen has a stable appearance. There is no evidence of splenic injury or perisplenic hematoma.  IMPRESSION: 1. Interval development of moderate-sized hemothorax posteriorly on the left. The left chest tube appears to be within the major fissure and may not adequately drain this fluid. 2. Increasing left lower lobe collapse  with patchy ground-glass opacities in the left upper lobe and right lung. 3. No evidence of mediastinal hematoma or pneumothorax. 4. Multiple segmental left-sided rib fractures again noted.   Electronically Signed  By: Camie Patience M.D.   On: 08/24/2013 12:48   Dg Chest Port 1 View  08/24/2013   CLINICAL DATA:  Left chest tube.  EXAM: PORTABLE CHEST - 1 VIEW  COMPARISON:  08/23/2013 and prior chest radiographs  FINDINGS: A left thoracostomy tube is again noted with left pleural effusion and left lower lung consolidation/atelectasis.  There is no evidence of pneumothorax.  Multiple left-sided rib fractures are again identified.  The loop recorder overlying the chest is present.  The right lung is clear.  IMPRESSION: Unchanged chest radiograph with left thoracostomy tube, left pleural effusion and left lower lung consolidation/ atelectasis.   Electronically Signed   By: Hassan Rowan M.D.   On: 08/24/2013 11:57   Dg Chest Port 1 View  08/23/2013   CLINICAL DATA:  Left chest tube  EXAM: PORTABLE CHEST - 1 VIEW  COMPARISON:  Prior chest x-ray 08/22/2013  FINDINGS: Left chest tube remains in place. No pneumothorax. Persistent left basilar opacity which may reflect a combination of pleural fluid and atelectasis or infiltrate. Implanted loop recorder projects over the left chest. Stable cardiac and mediastinal contours. Atherosclerotic calcification present in the transverse aorta. Inspiratory volumes overall are low and there is mild right medial basilar atelectasis. No overt pulmonary edema. Mildly displaced left rib fractures are unchanged.  IMPRESSION: No significant interval change in the appearance of the chest. Left chest tube remains in position.   Electronically Signed   By: Jacqulynn Cadet M.D.   On: 08/23/2013 08:50   Medications:  Scheduled Meds: . aspirin EC  81 mg Oral Daily  . atorvastatin  40 mg Oral q1800  . bacitracin   Topical BID  . diltiazem  10 mg Intravenous Once  . feeding supplement  (GLUCERNA SHAKE)  237 mL Oral BID BM  . insulin aspart  0-9 Units Subcutaneous TID WC  . insulin glargine  10 Units Subcutaneous QHS  . methocarbamol  1,500 mg Oral QID  . metoprolol tartrate  25 mg Oral BID  . mometasone-formoterol  2 puff Inhalation BID  . pantoprazole  40 mg Oral Daily  . polyethylene glycol  17 g Oral Daily  . regadenoson  0.4 mg Intravenous Once  . senna-docusate  1 tablet Oral BID  . sodium chloride  3 mL Intravenous Q12H  . Tdap  0.5 mL Intramuscular Once  . traMADol  100 mg Oral 4 times per day   Continuous Infusions: . diltiazem (CARDIZEM) infusion 10 mg/hr (08/24/13 1142)   PRN Meds:.acetaminophen, calcium carbonate, ondansetron (ZOFRAN) IV, oxyCODONE Assessment/Plan: 68 y.o PMH HTN, Asthma, HLD, CKD, DM, NSTEMI, BPH, CHF, ischemic cardiomyopathy, CAD with total occlusion of LAD, circumflex 90%RCA stenosis. He presented 5/18 after syncopal fall at home with multiple lacerations and rib fractures found to have hemothorax   #A-fib -CHADSVASC 5  -Cards following holding heparin due to hemothorax -on Dilt gtt, Lopressor 25 mg bid    #Syncope  -possibly cardiogenic with h/o CAD, orthostatics positive on admission  -EP and cards following -carotid doppler neg, repeat echo x 2 this admission with EF 55-60% LA mod dilation, trivial pericardial effusion -LINQ recorder placed  -Continue BB, statin   #HYPERTENSION -BP controlled continue to monitor    #Diabetes mellitus 2 (HA1C 7.4) -SSI-S, Lantus 10  -monitor cbgs   #Multiple fractures of ribs of left side and lacerations of skin of scalp, back  -Ultram qid, prn Oxycodone, prn Robaxin  #left hemothorax/pleural effusion    -CT in place since 5/21. CT is  leaking and not draining much output  -repeat CXR and CT chest today -called Dr. Ninfa Linden trauma to disc CT findings of chest tube he stated may need to involved CVTS   #NSTEMI (non-ST elevated myocardial infarction) -resolved   #Urine  retention -foley in place will need to d/c at some point   #Anemia like chronic disease -trend CBC. Hbg stable 8.0 -s/p 1 units of blood this admission   #Constipation  -Miralax, Senna   #F/E/N -NSL -Asymptomatic hyponatremia. BMET in am  -carb mod diet   #DVT/GI px  -scds, Protonix   Dispo: Disposition is deferred at this time, awaiting improvement of current medical problems.  Anticipated discharge in approximately 3-5 day(s).   The patient does have a current PCP Charolotte Eke Powers) and does not need an [facility] hospital follow-up appointment after discharge.  The patient does not have transportation limitations that hinder transportation to clinic appointments.  .Services Needed at time of discharge: Y = Yes, Blank = No PT: SNF, consider CIR  OT: SNF, consider CIR  RN:   Equipment: Rolling walker  Other:     LOS: 6 days   Cresenciano Genre, MD [phone] 08/24/2013, 1:47 PM

## 2013-08-24 NOTE — Progress Notes (Signed)
Nurse contacted trama doctor to inform that CT had resulted, in addition to chest tube site saturation of dressing since beginning of shift.  Nurse communicated with OR nurse, per OR nurse Trauma doctor would assess pt.  Nurse communicated to Primary doctor team concerns, no new interventions ordered at this time.  Nurse will continue to monitor.

## 2013-08-24 NOTE — Progress Notes (Signed)
Patient ID: Gerald Rosario, male   DOB: [birth year], 68 y.o.   MRN: [id number] Dr Roxy Manns has seen patient for loculated hemothorax Will need VATS Known severe 3VD cath 2012 Admitted with synocpe and troponin peak 1.72 Agree with cath prior to surgery Orders written for Tuesday Will likely still have chest tube in  EF was 25% 2012 now normal suggesting large ischemic burden or possible hibernating myocardium with collateralized vessels  Josue Hector

## 2013-08-25 LAB — CBC
HCT: 24.9 % — ABNORMAL LOW (ref 39.0–52.0)
Hemoglobin: 8.5 g/dL — ABNORMAL LOW (ref 13.0–17.0)
MCH: 29.1 pg (ref 26.0–34.0)
MCHC: 34.1 g/dL (ref 30.0–36.0)
MCV: 85.3 fL (ref 78.0–100.0)
Platelets: 310 10*3/uL (ref 150–400)
RBC: 2.92 MIL/uL — ABNORMAL LOW (ref 4.22–5.81)
RDW: 14.6 % (ref 11.5–15.5)
WBC: 12.3 10*3/uL — ABNORMAL HIGH (ref 4.0–10.5)

## 2013-08-25 LAB — GLUCOSE, CAPILLARY
Glucose-Capillary: 199 mg/dL — ABNORMAL HIGH (ref 70–99)
Glucose-Capillary: 185 mg/dL — ABNORMAL HIGH (ref 70–99)
Glucose-Capillary: 139 mg/dL — ABNORMAL HIGH (ref 70–99)
Glucose-Capillary: 141 mg/dL — ABNORMAL HIGH (ref 70–99)

## 2013-08-25 LAB — TYPE AND SCREEN
ABO/RH(D): A POS
Antibody Screen: NEGATIVE
Unit division: 0
Unit division: 0

## 2013-08-25 LAB — BASIC METABOLIC PANEL
BUN: 32 mg/dL — ABNORMAL HIGH (ref 6–23)
CO2: 22 mEq/L (ref 19–32)
Calcium: 8.3 mg/dL — ABNORMAL LOW (ref 8.4–10.5)
Chloride: 96 mEq/L (ref 96–112)
Creatinine, Ser: 1.07 mg/dL (ref 0.50–1.35)
GFR calc Af Amer: 80 mL/min — ABNORMAL LOW (ref >90)
GFR calc non Af Amer: 69 mL/min — ABNORMAL LOW (ref >90)
Glucose, Bld: 177 mg/dL — ABNORMAL HIGH (ref 70–99)
Potassium: 4.6 mEq/L (ref 3.7–5.3)
Sodium: 129 mEq/L — ABNORMAL LOW (ref 137–147)

## 2013-08-25 LAB — MAGNESIUM: Magnesium: 3 mg/dL — ABNORMAL HIGH (ref 1.5–2.5)

## 2013-08-25 MED ORDER — DILTIAZEM HCL 60 MG PO TABS
60.0000 mg | ORAL_TABLET | Freq: Four times a day (QID) | ORAL | Status: DC
  Administered 2013-08-25 – 2013-08-26 (×5): 60 mg via ORAL
  Filled 2013-08-25 (×9): qty 1

## 2013-08-25 MED ORDER — SODIUM CHLORIDE 0.9 % IJ SOLN: 3.0000 mL | INTRAMUSCULAR | Status: DC | PRN

## 2013-08-25 MED ORDER — SODIUM CHLORIDE 0.9 % IJ SOLN
3.0000 mL | Freq: Two times a day (BID) | INTRAMUSCULAR | Status: DC
  Administered 2013-08-25 – 2013-08-26 (×2): 3 mL via INTRAVENOUS

## 2013-08-25 MED ORDER — SODIUM CHLORIDE 0.9 % IV SOLN: 250.0000 mL | INTRAVENOUS | Status: DC | PRN

## 2013-08-25 MED ORDER — ASPIRIN 81 MG PO CHEW
81.0000 mg | CHEWABLE_TABLET | ORAL | Status: AC
  Administered 2013-08-26: 81 mg via ORAL

## 2013-08-25 MED ORDER — SENNOSIDES-DOCUSATE SODIUM 8.6-50 MG PO TABS
1.0000 | ORAL_TABLET | Freq: Two times a day (BID) | ORAL | Status: AC
  Administered 2013-08-25 (×2): 1 via ORAL
  Filled 2013-08-25 (×2): qty 1

## 2013-08-25 MED ORDER — ASPIRIN 81 MG PO CHEW
81.0000 mg | CHEWABLE_TABLET | ORAL | Status: DC
  Filled 2013-08-25: qty 1

## 2013-08-25 MED ORDER — SODIUM CHLORIDE 0.9 % IV SOLN
1.0000 mL/kg/h | INTRAVENOUS | Status: DC
  Administered 2013-08-25 – 2013-08-26 (×2): 1 mL/kg/h via INTRAVENOUS

## 2013-08-25 NOTE — Progress Notes (Signed)
5 Days Post-Op  Subjective: PT with no acute episodes. Con't with pulm toilet  Objective: Vital signs in last 24 hours: Temp:  [97.1 F (36.2 C)-98.2 F (36.8 C)] 98.2 F (36.8 C) (05/25 0346) Pulse Rate:  [78-93] 82 (05/25 0000) Resp:  [11-20] 12 (05/25 0000) BP: (109-146)/(53-69) 124/66 mmHg (05/25 0000) SpO2:  [94 %-99 %] 98 % (05/25 0000) Weight:  [184 lb 8.4 oz (83.7 kg)] 184 lb 8.4 oz (83.7 kg) (05/25 0600) Last BM Date: 08/17/13 (intervention in progress)  Intake/Output from previous day: 05/24 0701 - 05/25 0700 In: 1206.7 [P.O.:960; I.V.:246.7] Out: 2505 [Urine:2450; Chest Tube:55] Intake/Output this shift:    General appearance: alert and cooperative Cardio: regular rate and rhythm, S1, S2 normal, no murmur, click, rub or gallop GI: soft, non-tender; bowel sounds normal; no masses,  no organomegaly Chest: L CT in place on suction  Lab Results:   Recent Labs  08/24/13 0317 08/25/13 0230  WBC 13.0* 12.3*  HGB 8.0* 8.5*  HCT 23.4* 24.9*  PLT 285 310   BMET  Recent Labs  08/24/13 0317 08/25/13 0230  NA 130* 129*  K 4.9 4.6  CL 96 96  CO2 21 22  GLUCOSE 186* 177*  BUN 28* 32*  CREATININE 1.05 1.07  CALCIUM 8.4 8.3*   PT/INR No results found for this basename: LABPROT, INR,  in the last 72 hours ABG No results found for this basename: PHART, PCO2, PO2, HCO3,  in the last 72 hours  Studies/Results: Ct Chest W Contrast  08/24/2013   CLINICAL DATA:  Syncopal episode with fall resulting in multiple rib fractures and left hemothorax.  EXAM: CT CHEST WITH CONTRAST  TECHNIQUE: Multidetector CT imaging of the chest was performed during intravenous contrast administration.  CONTRAST:  45mL OMNIPAQUE IOHEXOL 300 MG/ML  SOLN  COMPARISON:  Radiographs 08/24/2013 and 08/23/2013.  CT 08/18/2013.  FINDINGS: The mediastinum has a stable appearance. There is no evidence of mediastinal hematoma. Atherosclerosis of the aorta, great vessels and coronary arteries is  noted. Minimal heterogeneity of the thyroid gland appear stable.  Anteriorly inserted left-sided chest tube is likely within the major fissure. There is increased volume loss in the left hemithorax with subtotal collapse of the left lower lobe. There are associated air bronchograms. There is patchy atelectasis within the left upper lobe. In addition, patchy peripheral ground-glass opacities are present throughout the right lung. These may reflect atelectasis or contusion.  There is complex medium density fluid posteriorly in the left hemithorax consistent with hemothorax. This is primarily posterior and inferior to the tip of the chest tube. There is no significant pleural fluid on the right. There is no pericardial effusion or pneumothorax.  Multiple segmental left-sided rib fractures are again noted. The posterior components remain mildly displaced. There is no evidence of vertebral body or sternal fracture.  The visualized upper abdomen has a stable appearance. There is no evidence of splenic injury or perisplenic hematoma.  IMPRESSION: 1. Interval development of moderate-sized hemothorax posteriorly on the left. The left chest tube appears to be within the major fissure and may not adequately drain this fluid. 2. Increasing left lower lobe collapse with patchy ground-glass opacities in the left upper lobe and right lung. 3. No evidence of mediastinal hematoma or pneumothorax. 4. Multiple segmental left-sided rib fractures again noted.   Electronically Signed   By: Camie Patience M.D.   On: 08/24/2013 12:48   Dg Chest Port 1 View  08/24/2013   CLINICAL DATA:  Left chest  tube.  EXAM: PORTABLE CHEST - 1 VIEW  COMPARISON:  08/23/2013 and prior chest radiographs  FINDINGS: A left thoracostomy tube is again noted with left pleural effusion and left lower lung consolidation/atelectasis.  There is no evidence of pneumothorax.  Multiple left-sided rib fractures are again identified.  The loop recorder overlying the  chest is present.  The right lung is clear.  IMPRESSION: Unchanged chest radiograph with left thoracostomy tube, left pleural effusion and left lower lung consolidation/ atelectasis.   Electronically Signed   By: Hassan Rowan M.D.   On: 08/24/2013 11:57   Dg Chest Port 1 View  08/23/2013   CLINICAL DATA:  Left chest tube  EXAM: PORTABLE CHEST - 1 VIEW  COMPARISON:  Prior chest x-ray 08/22/2013  FINDINGS: Left chest tube remains in place. No pneumothorax. Persistent left basilar opacity which may reflect a combination of pleural fluid and atelectasis or infiltrate. Implanted loop recorder projects over the left chest. Stable cardiac and mediastinal contours. Atherosclerotic calcification present in the transverse aorta. Inspiratory volumes overall are low and there is mild right medial basilar atelectasis. No overt pulmonary edema. Mildly displaced left rib fractures are unchanged.  IMPRESSION: No significant interval change in the appearance of the chest. Left chest tube remains in position.   Electronically Signed   By: Jacqulynn Cadet M.D.   On: 08/23/2013 08:50    Anti-infectives: Anti-infectives   Start     Dose/Rate Route Frequency Ordered Stop   08/18/13 0645  ceFAZolin (ANCEF) IVPB 1 g/50 mL premix     1 g 100 mL/hr over 30 Minutes Intravenous  Once 08/18/13 9833 08/18/13 0804      Assessment/Plan: Fall  Multiple left rib fxs/hemothorax -- Pulmonary toilet, L CT placed 5/21, continue CT to suction today, TCTS planning on VATS.  Pt to get cardiac cath in AM Back lacerations -- Local care ABL anemia -- due to L HTX Syncope  Multiple medical problems -- per primary service,   LOS: 7 days    Ralene Ok 08/25/2013

## 2013-08-25 NOTE — Progress Notes (Signed)
Subjective:   VSS.  Pt feeling better this AM-pain is 1/10 when sitting and increased to 5/10 with movement.  He is using IS.  Still does not have much of an appetite and has not had a BM yet (since last Sun) with miralax and senna.  Hgb stable at 8.5 s/p 1 unit of prbcs on 5/21.  He is down 3.5L since admission.  Chest tube output 5ml yesterday.  Scheduled for cath tomorrow and then VATS for clotted left hemothorax.   Objective:   Vital signs in last 24 hours: Filed Vitals:   08/25/13 0800 08/25/13 0835 08/25/13 0911 08/25/13 1200  BP: 128/64  131/71 117/58  Pulse: 106   93  Temp: 97.5 F (36.4 C)   98 F (36.7 C)  TempSrc: Oral   Oral  Resp: 18   15  Height:      Weight:      SpO2: 94% 97%  96%    Weight: Filed Weights   08/22/13 0500 08/23/13 0500 08/25/13 0600  Weight: 185 lb 6.5 oz (84.1 kg) 189 lb 9.5 oz (86 kg) 184 lb 8.4 oz (83.7 kg)    Ins/Outs:  Intake/Output Summary (Last 24 hours) at 08/25/13 1227 Last data filed at 08/25/13 1111  Gross per 24 hour  Intake 1176.67 ml  Output   1305 ml  Net -128.33 ml    Physical Exam: Constitutional: Vital signs reviewed.  Patient is sitting up in chair with left chest tube in place; NAD.  HEENT: [place]/AT; PERRL, EOMI, conjunctivae normal, no scleral icterus  Cardiovascular: RRR, no MRG Pulmonary/Chest: normal respiratory effort, no accessory muscle use, left chest tube in place; without rales, wheezes, or rhonchi Abdominal: Soft. +BS, NT/ND Neurological: A&O x3, CN II-XII grossly intact; non-focal exam Musculoskeletal: 15cm laceration R upper back at approximately the T3 level.   Abrasions noted left mid thoracic and back region with TTP. No midline spinal tenderness noted.  Extremities: 2+DP b/l, no C/C/E  Skin: Warm, dry.    Lab Results:  BMP:  Recent Labs Lab 08/18/13 1345  08/24/13 0317 08/25/13 0230  NA  --   < > 130* 129*  K  --   < > 4.9 4.6  CL  --   < > 96 96  CO2  --   < > 21 22  GLUCOSE  --   < >  186* 177*  BUN  --   < > 28* 32*  CREATININE  --   < > 1.05 1.07  CALCIUM  --   < > 8.4 8.3*  MG 2.0  --   --  3.0*  < > = values in this interval not displayed. Anion Gap:  11  CBC:  Recent Labs Lab 08/22/13 0425 08/23/13 0315 08/24/13 0317 08/25/13 0230  WBC 9.7 11.3* 13.0* 12.3*  NEUTROABS 8.1* 9.0*  --   --   HGB 8.3* 8.2* 8.0* 8.5*  HCT 25.0* 23.5* 23.4* 24.9*  MCV 87.4 85.5 85.7 85.3  PLT 226 246 285 310    Coagulation:  Recent Labs Lab 08/18/13 1345  LABPROT 14.1  INR 1.11    CBG:            Recent Labs Lab 08/23/13 2145 08/24/13 0903 08/24/13 1159 08/24/13 1734 08/24/13 2053 08/25/13 1021  GLUCAP 149* 171* 158* 178* 164* 199*           HA1C:       Recent Labs Lab 08/18/13 1345  HGBA1C 7.4*  Lipid Panel:  Recent Labs Lab 08/20/13 0433  CHOL 165  HDL 63  LDLCALC 79  TRIG 113  CHOLHDL 2.6    LFTs:  Recent Labs Lab 08/18/13 1345  AST 32  ALT 17  ALKPHOS 50  BILITOT 0.8  PROT 7.1  ALBUMIN 3.5    Pancreatic Enzymes: No results found for this basename: LIPASE, AMYLASE,  in the last 168 hours  Lactic Acid/Procalcitonin:  Recent Labs Lab 08/18/13 1345  LATICACIDVEN 2.0    Ammonia: No results found for this basename: AMMONIA,  in the last 168 hours  Cardiac Enzymes:  Recent Labs Lab 08/18/13 1345  08/19/13 2247 08/20/13 0433 08/20/13 1010 08/21/13 0259  CKTOTAL 537*  --   --  352*  --   --   CKMB 7.0*  --   --   --   --   --   TROPONINI 0.32*  < > 0.59*  --  0.36* <0.30  < > = values in this interval not displayed.  EKG: EKG Interpretation  Date/Time:  Monday Aug 18 2013 04:59:50 EDT Ventricular Rate:  78 PR Interval:  169 QRS Duration: 107 QT Interval:  394 QTC Calculation: 449 R Axis:   6 Text Interpretation:  Sinus rhythm Anterior infarct, old ED PHYSICIAN INTERPRETATION AVAILABLE IN [facility] [place] Confirmed by TEST, Record ([id number]) on 08/20/2013 7:15:39 AM   BNP: No results found for this  basename: PROBNP,  in the last 168 hours  D-Dimer: No results found for this basename: DDIMER,  in the last 168 hours  Urinalysis: No results found for this basename: COLORURINE, APPERANCEUR, LABSPEC, PHURINE, GLUCOSEU, HGBUR, BILIRUBINUR, KETONESUR, PROTEINUR, UROBILINOGEN, NITRITE, LEUKOCYTESUR,  in the last 168 hours  Micro Results: Recent Results (from the past 240 hour(s))  MRSA PCR SCREENING     Status: None   Collection Time    08/18/13  4:00 PM      Result Value Ref Range Status   MRSA by PCR NEGATIVE  NEGATIVE Final   Comment:            The GeneXpert MRSA Assay (FDA     approved for NASAL specimens     only), is one component of a     comprehensive MRSA colonization     surveillance program. It is not     intended to diagnose MRSA     infection nor to guide or     monitor treatment for     MRSA infections.    Blood Culture: No results found for this basename: sdes,  specrequest,  cult,  reptstatus    Studies/Results: Ct Chest W Contrast  08/24/2013   CLINICAL DATA:  Syncopal episode with fall resulting in multiple rib fractures and left hemothorax.  EXAM: CT CHEST WITH CONTRAST  TECHNIQUE: Multidetector CT imaging of the chest was performed during intravenous contrast administration.  CONTRAST:  39mL OMNIPAQUE IOHEXOL 300 MG/ML  SOLN  COMPARISON:  Radiographs 08/24/2013 and 08/23/2013.  CT 08/18/2013.  FINDINGS: The mediastinum has a stable appearance. There is no evidence of mediastinal hematoma. Atherosclerosis of the aorta, great vessels and coronary arteries is noted. Minimal heterogeneity of the thyroid gland appear stable.  Anteriorly inserted left-sided chest tube is likely within the major fissure. There is increased volume loss in the left hemithorax with subtotal collapse of the left lower lobe. There are associated air bronchograms. There is patchy atelectasis within the left upper lobe. In addition, patchy peripheral ground-glass opacities are present throughout  the right lung. These  may reflect atelectasis or contusion.  There is complex medium density fluid posteriorly in the left hemithorax consistent with hemothorax. This is primarily posterior and inferior to the tip of the chest tube. There is no significant pleural fluid on the right. There is no pericardial effusion or pneumothorax.  Multiple segmental left-sided rib fractures are again noted. The posterior components remain mildly displaced. There is no evidence of vertebral body or sternal fracture.  The visualized upper abdomen has a stable appearance. There is no evidence of splenic injury or perisplenic hematoma.  IMPRESSION: 1. Interval development of moderate-sized hemothorax posteriorly on the left. The left chest tube appears to be within the major fissure and may not adequately drain this fluid. 2. Increasing left lower lobe collapse with patchy ground-glass opacities in the left upper lobe and right lung. 3. No evidence of mediastinal hematoma or pneumothorax. 4. Multiple segmental left-sided rib fractures again noted.   Electronically Signed   By: Camie Patience M.D.   On: 08/24/2013 12:48   Dg Chest Port 1 View  08/24/2013   CLINICAL DATA:  Left chest tube.  EXAM: PORTABLE CHEST - 1 VIEW  COMPARISON:  08/23/2013 and prior chest radiographs  FINDINGS: A left thoracostomy tube is again noted with left pleural effusion and left lower lung consolidation/atelectasis.  There is no evidence of pneumothorax.  Multiple left-sided rib fractures are again identified.  The loop recorder overlying the chest is present.  The right lung is clear.  IMPRESSION: Unchanged chest radiograph with left thoracostomy tube, left pleural effusion and left lower lung consolidation/ atelectasis.   Electronically Signed   By: Hassan Rowan M.D.   On: 08/24/2013 11:57    Medications:  Scheduled Meds: . aspirin EC  81 mg Oral Daily  . atorvastatin  40 mg Oral q1800  . bacitracin   Topical BID  . diltiazem  60 mg Oral 4 times per  day  . feeding supplement (GLUCERNA SHAKE)  237 mL Oral BID BM  . insulin aspart  0-9 Units Subcutaneous TID WC  . insulin glargine  10 Units Subcutaneous QHS  . methocarbamol  1,500 mg Oral QID  . metoprolol tartrate  25 mg Oral BID  . mometasone-formoterol  2 puff Inhalation BID  . pantoprazole  40 mg Oral Daily  . polyethylene glycol  17 g Oral Daily  . regadenoson  0.4 mg Intravenous Once  . senna-docusate  1 tablet Oral BID  . sodium chloride  3 mL Intravenous Q12H  . Tdap  0.5 mL Intramuscular Once  . traMADol  100 mg Oral 4 times per day   Continuous Infusions:   PRN Meds: acetaminophen, calcium carbonate, ondansetron (ZOFRAN) IV, oxyCODONE  Antibiotics: Antibiotics Given (last 72 hours)   None      Day of Hospitalization: 7  Consults: Treatment Team:  Trauma Md, MD [facility], MD Rexene Alberts, MD  Assessment/Plan:   Principal Problem:   Syncope Active Problems:   HYPERTENSION   Coronary artery disease   Ischemic cardiomyopathy   Diabetes mellitus   Hyperkalemia   Chronic systolic heart failure   Multiple fractures of ribs of left side   Laceration of skin of scalp   Hematoma of occipital surface of head   Chronic sinusitis   Laceration of skin   NSTEMI (non-ST elevated myocardial infarction)   Pleural effusion   Urine retention   Syncope and collapse   Pleural effusion, left   Hemothorax on left   A-fib  Multiple segmental  rib fractures with clotted left hemothorax Pain improving.  5/24 CT chest showed moderate amt of residual clotted blood in posterior hemithorax with LLL atx.  CVTS (Dr. Roxy Manns) consulted and is recommending VATS but should undergo diagnostic cath prior to surgery.  Cards scheduled cath for Tuesday.   -will need VATS but will undergo cardiac cath prior to surgery (scheduled for Tues) -pain management -pulmonary toilet/IS  Acute atrial fibrillation with RVR  HR in low 100s this AM.  Went into a-fib with HR up to  140s; was asymptomatic.  Cardiology  consulted with orders for diltiazem gtt.  TSH wnl.  -cards following, increased metoprolol for rate control to 25mg  bid -d/c cardizem gtt today per cards -no heparin gtt per cards  Syncope  Stable-no further episodes this admission. Trops trended down.  EP consulted and placed implantable loop recorder.  5/19 echo: LVEF 55-60% with LA dilation but unchanged from 2013 echo.   -appreciate EP recs -cath tomorrow  NSTEMI Stable.  Trops trended down.  Lipid panel OK.  Cards to do cath Tuesday.   -NPO after midnight -atorvastatin to 40mg  qd  Acute on chronic normocytic anemia Pt with acute blood loss d/t multiple rib fractures.  Anemia panel revealed normal retic, ferritin wnl, iron low at 13, and TIBC low at 205.  Folate wnl.  B12 low at 287, will get MMA.  Pt never had colonoscopy and will need as outpatient.   -pt needs GI follow-up as outpatient  Upper back skin laceration 15 cm laceration at risk for skin necrosis.  Laceration repaired by trama service. -monitor -bacitracin applied bid  Left suboccipital scalp hematoma and laceration -monitor  AKI Resolved.  Baseline is 1.1-1.2.  He was noted to have mild elevated Cr at 1.7 on admission. CK trended down.  -hold lasix -avoid nephrotoxic meds -BMP in am  Leukocytosis Likely stress response.  h/o hypertension  BP stable.  On home lasix, imdur and metoprolol.  -will hold lasix and imdur given AKI and syncope workup. -continue metoprolol at lower dose given hypotension   DMII Lab Results  Component Value Date   HGBA1C 7.4* 08/18/2013    Recent Labs  08/24/13 1734 08/24/13 2053 08/25/13 1021  GLUCAP 178* 164* 199*  -hold home glipizide -Lantus 10 units QHS -SSI  Chronic sinusitis This is an incidental finding on CT head.   -follow-up as outpatient    Deconditioning PT recommending SNF but pt not amenable-would like HH.    Mild hyponatremia Asymptomatic.  -monitor  BMP  Hyperkalemia Resolved.  No EKG changes.  Given kayexalate x 2 without any BM.  -BMP in AM  VTE SCDs  Dispo: Disposition is deferred at this time, awaiting improvement of current medical problems. Anticipated discharge in approximately 2-3 day(s).  Pt is not interested in going to inpatient rehab or SNF.  Would like to go home with [facility].    The patient does have a current PCP Charolotte Eke Powers) and does need an [facility] hospital follow-up appointment after discharge.   Signed: Jones Bales, MD 08/25/2013, 12:27 PM

## 2013-08-25 NOTE — Progress Notes (Addendum)
Internal Medicine On-Call Attending  Date: 08/25/2013  Patient name: Gerald Rosario Medical record number: [id number] Date of birth: 02-03-1946 Age: 68 y.o. Gender: male  I saw and evaluated the patient. I discussed patient and reviewed the resident's note by Dr. Gordy Levan, and I agree with the resident's findings and plans as documented in her note.

## 2013-08-25 NOTE — Progress Notes (Signed)
Patient ID: Gerald Rosario, male   DOB: 06/25/1945, 68 y.o.   MRN: [id number]    Subjective:  Denies SSCP, palpitations or dyspnea Chest tube sore  Objective:  Filed Vitals:   08/24/13 2211 08/25/13 0000 08/25/13 0346 08/25/13 0600  BP: 111/53 124/66    Pulse: 93 82    Temp:  98.2 F (36.8 C) 98.2 F (36.8 C)   TempSrc:  Oral Oral   Resp:  12    Height:      Weight:    184 lb 8.4 oz (83.7 kg)  SpO2:  98%      Intake/Output from previous day:  Intake/Output Summary (Last 24 hours) at 08/25/13 0757 Last data filed at 08/25/13 0700  Gross per 24 hour  Intake 1206.67 ml  Output   2505 ml  Net -1298.33 ml    Physical Exam: Affect appropriate HEENT: normal Neck supple  Lungs Diminished BS throughout left lung field Heart:  Irregular Abdomen: soft, NT/ND No edema Neuro non-focal Skin warm and dry    Lab Results: Basic Metabolic Panel:  Recent Labs  08/24/13 0317 08/25/13 0230  NA 130* 129*  K 4.9 4.6  CL 96 96  CO2 21 22  GLUCOSE 186* 177*  BUN 28* 32*  CREATININE 1.05 1.07  CALCIUM 8.4 8.3*  MG  --  3.0*   CBC:  Recent Labs  08/23/13 0315 08/24/13 0317 08/25/13 0230  WBC 11.3* 13.0* 12.3*  NEUTROABS 9.0*  --   --   HGB 8.2* 8.0* 8.5*  HCT 23.5* 23.4* 24.9*  MCV 85.5 85.7 85.3  PLT 246 285 310   Imaging: Imaging results have been reviewed and Ct Chest W Contrast  08/24/2013   CLINICAL DATA:  Syncopal episode with fall resulting in multiple rib fractures and left hemothorax.  EXAM: CT CHEST WITH CONTRAST  TECHNIQUE: Multidetector CT imaging of the chest was performed during intravenous contrast administration.  CONTRAST:  3mL OMNIPAQUE IOHEXOL 300 MG/ML  SOLN  COMPARISON:  Radiographs 08/24/2013 and 08/23/2013.  CT 08/18/2013.  FINDINGS: The mediastinum has a stable appearance. There is no evidence of mediastinal hematoma. Atherosclerosis of the aorta, great vessels and coronary arteries is noted. Minimal heterogeneity of the thyroid gland  appear stable.  Anteriorly inserted left-sided chest tube is likely within the major fissure. There is increased volume loss in the left hemithorax with subtotal collapse of the left lower lobe. There are associated air bronchograms. There is patchy atelectasis within the left upper lobe. In addition, patchy peripheral ground-glass opacities are present throughout the right lung. These may reflect atelectasis or contusion.  There is complex medium density fluid posteriorly in the left hemithorax consistent with hemothorax. This is primarily posterior and inferior to the tip of the chest tube. There is no significant pleural fluid on the right. There is no pericardial effusion or pneumothorax.  Multiple segmental left-sided rib fractures are again noted. The posterior components remain mildly displaced. There is no evidence of vertebral body or sternal fracture.  The visualized upper abdomen has a stable appearance. There is no evidence of splenic injury or perisplenic hematoma.  IMPRESSION: 1. Interval development of moderate-sized hemothorax posteriorly on the left. The left chest tube appears to be within the major fissure and may not adequately drain this fluid. 2. Increasing left lower lobe collapse with patchy ground-glass opacities in the left upper lobe and right lung. 3. No evidence of mediastinal hematoma or pneumothorax. 4. Multiple segmental left-sided rib fractures again noted.  Electronically Signed   By: Camie Patience M.D.   On: 08/24/2013 12:48   Dg Chest Port 1 View  08/24/2013   CLINICAL DATA:  Left chest tube.  EXAM: PORTABLE CHEST - 1 VIEW  COMPARISON:  08/23/2013 and prior chest radiographs  FINDINGS: A left thoracostomy tube is again noted with left pleural effusion and left lower lung consolidation/atelectasis.  There is no evidence of pneumothorax.  Multiple left-sided rib fractures are again identified.  The loop recorder overlying the chest is present.  The right lung is clear.   IMPRESSION: Unchanged chest radiograph with left thoracostomy tube, left pleural effusion and left lower lung consolidation/ atelectasis.   Electronically Signed   By: Hassan Rowan M.D.   On: 08/24/2013 11:57   Dg Chest Port 1 View  08/23/2013   CLINICAL DATA:  Left chest tube  EXAM: PORTABLE CHEST - 1 VIEW  COMPARISON:  Prior chest x-ray 08/22/2013  FINDINGS: Left chest tube remains in place. No pneumothorax. Persistent left basilar opacity which may reflect a combination of pleural fluid and atelectasis or infiltrate. Implanted loop recorder projects over the left chest. Stable cardiac and mediastinal contours. Atherosclerotic calcification present in the transverse aorta. Inspiratory volumes overall are low and there is mild right medial basilar atelectasis. No overt pulmonary edema. Mildly displaced left rib fractures are unchanged.  IMPRESSION: No significant interval change in the appearance of the chest. Left chest tube remains in position.   Electronically Signed   By: Jacqulynn Cadet M.D.   On: 08/23/2013 08:50    Cardiac Studies:   Telemetry:  afib rates rate 80s  Echo:  EF 60-65% moderate LAE   Medications:   . aspirin EC  81 mg Oral Daily  . atorvastatin  40 mg Oral q1800  . bacitracin   Topical BID  . diltiazem  10 mg Intravenous Once  . feeding supplement (GLUCERNA SHAKE)  237 mL Oral BID BM  . insulin aspart  0-9 Units Subcutaneous TID WC  . insulin glargine  10 Units Subcutaneous QHS  . methocarbamol  1,500 mg Oral QID  . metoprolol tartrate  25 mg Oral BID  . mometasone-formoterol  2 puff Inhalation BID  . pantoprazole  40 mg Oral Daily  . polyethylene glycol  17 g Oral Daily  . regadenoson  0.4 mg Intravenous Once  . sodium chloride  3 mL Intravenous Q12H  . Tdap  0.5 mL Intramuscular Once  . traMADol  100 mg Oral 4 times per day     . diltiazem (CARDIZEM) infusion 10 mg/hr (08/24/13 2330)    Assessment/Plan:  Afib:  Continue rate control with metoprolol and  cardizem (change to po); no anticoagulation given hemothorax. Once hemothorax treated/evacuated, will begin anticoagulation and proceed with DCCV if atrial fibrillation persists.  CAD/NSTEMI:  Continue ASA, metoprolol and statin; for cath in AM (risks and benefits discussed and patient agrees to proceed).  Syncope:  Etiology not clear.  ILR placed by EP.  Previous EP study by Caryl Comes not inducable. LV function normal.  Chol:  Continue statin   Hemothorax: Dr Roxy Manns following; for surgical evacuation once coronary artery disease addressed.  [name] 08/25/2013, 7:57 AM

## 2013-08-25 NOTE — Progress Notes (Signed)
OT Cancellation Note  Patient Details Name: Gerald Rosario MRN: [id number] DOB: [birth year]   Cancelled Treatment:    Reason Eval/Treat Not Completed: Patient declined, no reason specified  Peri Maris Pager: [phone]  08/25/2013, 2:04 PM

## 2013-08-25 NOTE — Clinical Social Work Note (Addendum)
PT recommended SNF.    CSW now following - assisting with disposition/placement.  Nonnie Done, [place] [phone]  Clinical Social Work

## 2013-08-26 ENCOUNTER — Encounter (HOSPITAL_COMMUNITY)
Admission: EM | Disposition: A | Discharge status: Skilled Nursing Facility | Payer: Self-pay | Source: Home / Self Care | Attending: Thoracic Surgery (Cardiothoracic Vascular Surgery)

## 2013-08-26 DIAGNOSIS — R5381 Other malaise: Secondary | ICD-10-CM

## 2013-08-26 DIAGNOSIS — D649 Anemia, unspecified: Secondary | ICD-10-CM

## 2013-08-26 DIAGNOSIS — I4891 Unspecified atrial fibrillation: Secondary | ICD-10-CM

## 2013-08-26 HISTORY — PX: LEFT HEART CATHETERIZATION WITH CORONARY ANGIOGRAM: SHX5451

## 2013-08-26 LAB — BASIC METABOLIC PANEL
BUN: 34 mg/dL — ABNORMAL HIGH (ref 6–23)
CO2: 21 mEq/L (ref 19–32)
Calcium: 8.4 mg/dL (ref 8.4–10.5)
Chloride: 96 mEq/L (ref 96–112)
Creatinine, Ser: 1.05 mg/dL (ref 0.50–1.35)
GFR calc Af Amer: 82 mL/min — ABNORMAL LOW (ref >90)
GFR calc non Af Amer: 71 mL/min — ABNORMAL LOW (ref >90)
Glucose, Bld: 171 mg/dL — ABNORMAL HIGH (ref 70–99)
Potassium: 4.9 mEq/L (ref 3.7–5.3)
Sodium: 131 mEq/L — ABNORMAL LOW (ref 137–147)

## 2013-08-26 LAB — CBC
HCT: 24.3 % — ABNORMAL LOW (ref 39.0–52.0)
Hemoglobin: 8.2 g/dL — ABNORMAL LOW (ref 13.0–17.0)
MCH: 28.7 pg (ref 26.0–34.0)
MCHC: 33.7 g/dL (ref 30.0–36.0)
MCV: 85 fL (ref 78.0–100.0)
Platelets: 334 10*3/uL (ref 150–400)
RBC: 2.86 MIL/uL — ABNORMAL LOW (ref 4.22–5.81)
RDW: 14.6 % (ref 11.5–15.5)
WBC: 12.5 10*3/uL — ABNORMAL HIGH (ref 4.0–10.5)

## 2013-08-26 LAB — GLUCOSE, CAPILLARY
Glucose-Capillary: 137 mg/dL — ABNORMAL HIGH (ref 70–99)
Glucose-Capillary: 123 mg/dL — ABNORMAL HIGH (ref 70–99)
Glucose-Capillary: 115 mg/dL — ABNORMAL HIGH (ref 70–99)
Glucose-Capillary: 114 mg/dL — ABNORMAL HIGH (ref 70–99)

## 2013-08-26 LAB — MAGNESIUM: Magnesium: 2.9 mg/dL — ABNORMAL HIGH (ref 1.5–2.5)

## 2013-08-26 SURGERY — LEFT HEART CATHETERIZATION WITH CORONARY ANGIOGRAM
Anesthesia: LOCAL

## 2013-08-26 MED ORDER — SODIUM CHLORIDE 0.9 % IV SOLN
250.0000 mL | INTRAVENOUS | Status: DC | PRN
Start: 2013-08-26 — End: 2013-08-28

## 2013-08-26 MED ORDER — DILTIAZEM HCL ER COATED BEADS 240 MG PO CP24
240.0000 mg | ORAL_CAPSULE | Freq: Every day | ORAL | Status: DC
  Administered 2013-08-26 – 2013-08-27 (×2): 240 mg via ORAL
  Filled 2013-08-26 (×2): qty 1

## 2013-08-26 MED ORDER — LIDOCAINE HCL (PF) 1 % IJ SOLN
INTRAMUSCULAR | Status: AC
  Filled 2013-08-26: qty 30

## 2013-08-26 MED ORDER — HEPARIN (PORCINE) IN NACL 2-0.9 UNIT/ML-% IJ SOLN
INTRAMUSCULAR | Status: AC
  Filled 2013-08-26: qty 1000

## 2013-08-26 MED ORDER — FLEET ENEMA 7-19 GM/118ML RE ENEM
1.0000 | ENEMA | Freq: Once | RECTAL | Status: DC
  Filled 2013-08-26: qty 1

## 2013-08-26 MED ORDER — SODIUM CHLORIDE 0.9 % IV SOLN
1.0000 mL/kg/h | INTRAVENOUS | Status: AC
  Administered 2013-08-26: 1 mL/kg/h via INTRAVENOUS

## 2013-08-26 MED ORDER — SODIUM CHLORIDE 0.9 % IJ SOLN
3.0000 mL | INTRAMUSCULAR | Status: DC | PRN
Start: 2013-08-26 — End: 2013-08-28

## 2013-08-26 MED ORDER — SODIUM CHLORIDE 0.9 % IJ SOLN
3.0000 mL | Freq: Two times a day (BID) | INTRAMUSCULAR | Status: DC
  Administered 2013-08-26 – 2013-08-27 (×2): 3 mL via INTRAVENOUS

## 2013-08-26 MED ORDER — MIDAZOLAM HCL 2 MG/2ML IJ SOLN
INTRAMUSCULAR | Status: AC
  Filled 2013-08-26: qty 2

## 2013-08-26 MED ORDER — NITROGLYCERIN 0.2 MG/ML ON CALL CATH LAB
INTRAVENOUS | Status: AC
  Filled 2013-08-26: qty 1

## 2013-08-26 MED ORDER — FENTANYL CITRATE 0.05 MG/ML IJ SOLN
INTRAMUSCULAR | Status: AC
  Filled 2013-08-26: qty 2

## 2013-08-26 MED ORDER — FUROSEMIDE 40 MG PO TABS
40.0000 mg | ORAL_TABLET | Freq: Two times a day (BID) | ORAL | Status: DC
  Administered 2013-08-26 – 2013-08-27 (×3): 40 mg via ORAL
  Filled 2013-08-26 (×5): qty 1

## 2013-08-26 NOTE — Progress Notes (Signed)
Subjective: Patient is feeling better, his pain has decreased to 3/10 with movement. His appetite is coming back, had a bowel movement yesterday which relieved him a lot. Don't want to remove foley's yet.He is NPO and waiting for CATH. today. Objective: Vital signs in last 24 hours: Filed Vitals:   08/26/13 0608 08/26/13 0744 08/26/13 0841 08/26/13 1230  BP: 133/70 127/61  123/67  Pulse:  96  91  Temp:  98.1 F (36.7 C)  98.7 F (37.1 C)  TempSrc:  Oral  Oral  Resp:  14  17  Height:      Weight:      SpO2:  98% 95% 99%   Weight change: 0 kg (0 lb)  Intake/Output Summary (Last 24 hours) at 08/26/13 1318 Last data filed at 08/26/13 1232  Gross per 24 hour  Intake  853.5 ml  Output   1416 ml  Net -562.5 ml   EXAM:  GENERAL: Alert and oriented , comfortable.  HEENT: PERRL, EOMI  NECK: Supple, no JVD, no adenopathy  CHEST: left Ct in place, no erythema, or swelling.mild drainage along the CT incision site. LUNG: Clear B/L with much  improved air entry as compare to before.  HEART: RRR, S1 and S@, no MRG  ABD: S/ND/NT. No HSM. BS +  NEURO;A & O X 3. Moving all extremities, no gross neuro. Deficit  EXT: no edema, cyanosis, PP 2+ B/L  Lab Results: @LABTEST2 @ Micro Results: Recent Results (from the past 240 hour(s))  MRSA PCR SCREENING     Status: None   Collection Time    08/18/13  4:00 PM      Result Value Ref Range Status   MRSA by PCR NEGATIVE  NEGATIVE Final   Comment:            The GeneXpert MRSA Assay (FDA     approved for NASAL specimens     only), is one component of a     comprehensive MRSA colonization     surveillance program. It is not     intended to diagnose MRSA     infection nor to guide or     monitor treatment for     MRSA infections.   Studies/Results: No results found. Medications: medication reviewed Scheduled Meds: . aspirin  81 mg Oral Pre-Cath  . aspirin EC  81 mg Oral Daily  . atorvastatin  40 mg Oral q1800  . bacitracin   Topical  BID  . diltiazem  240 mg Oral Daily  . feeding supplement (GLUCERNA SHAKE)  237 mL Oral BID BM  . furosemide  40 mg Oral BID  . insulin aspart  0-9 Units Subcutaneous TID WC  . insulin glargine  10 Units Subcutaneous QHS  . methocarbamol  1,500 mg Oral QID  . metoprolol tartrate  25 mg Oral BID  . mometasone-formoterol  2 puff Inhalation BID  . pantoprazole  40 mg Oral Daily  . polyethylene glycol  17 g Oral Daily  . regadenoson  0.4 mg Intravenous Once  . sodium chloride  3 mL Intravenous Q12H  . sodium chloride  3 mL Intravenous Q12H  . sodium chloride  3 mL Intravenous Q12H  . sodium phosphate  1 enema Rectal Once  . Tdap  0.5 mL Intramuscular Once  . traMADol  100 mg Oral 4 times per day   Continuous Infusions: . sodium chloride 1 mL/kg/hr (08/26/13 0608)   PRN Meds:.sodium chloride, sodium chloride, acetaminophen, calcium carbonate, ondansetron (ZOFRAN) IV, oxyCODONE, sodium  chloride, sodium chloride Assessment/Plan:  A FIB: Still having HR of 106, although asymptomatic, on Cardizem and metoprolol . Avoid anticoagulation due to his current hemothorax. Continue monitoring him.  LEFT HEMOTHORAX: Patient had a chest tube placed 5 days ago,still draining bloody fluid, his CT drainage has decreased with persistent CXR changes,suggestive of clotted blood, cardiothoracic surgery was consulted , advice VATS after cardiology clearance with CATH. Today,Clinically improved with much decreased pain and no SOB His Hb was 8.2 today which is little lower then  8.5 yesterday,continue monitor his drainage, vitals and CBC , if there is more drop, might need another blood transfusion. . Repeat CXR and CBC tomorrow. :  SYNCOPE: Pt. Was feeling better this morning. Chest tightness and pain has improved . His Na and CL is little low today with Na of 131 which is up from 129 yesterday,may be little dehydrated. Can give him some 0.9% saline as he is NPO due to scheduled CATH. today, carefully monitor for any  sign of CHF and repeat BMP tomorrow.  NSTEMI: His troponin has normalized,mightt had demand ischemia after that initial episode of syncope. Cardialogy is following, had his LINQ recorded implanted , Going for  CATH. today. No CP, keep him on telemetry.  RIB FRACTURE: Still having some pain esp. With any movement, his chest discomfort has much decreased since admission . Continue with pain management and PT/OT to help him heel.  BACK LACERATION:Continue with wound care.  SCALP LACERATION: Healing today, keep monitor for any sign of infection  DM: His blood CBG was 171 today, on  Novolog and lantus 10 unit , Continue monitoring with CBG.  HTN: HIs BP is 133/70 today, much improved, keep monitoring.  URINARY RETENTION: He was unable to void during his hospital stay, may be due to pain meds. And his BPH combined, had foley's in since 5 days.Patient was told about the possible chance of UTI with foley's, but wants to keep it little more as he don't want in and out and afraid he might not able to still void.Going for Cath. Today,reassess tomorrow and try to remove and see if he can void, as he not requiring that much of pain meds. Now..  ANOREXIA: According to patient his appetite is getting little better,Continue with regular diet after the CATH. Today. .  CONSTIPATION:Had 1 bowel movement yesterday after anema, feeling much relieved after that, continue with mira lax and senna with anema prn.  This is a Careers information officer Note.  The care of the patient was discussed with Dr. Aundra Dubin  and the assessment and plan formulated with their assistance.  Please see their attached note for official documentation of the daily encounter.   LOS: 8 days   Lorella Nimrod, Med Student 08/26/2013, 1:18 PM

## 2013-08-26 NOTE — H&P (View-Only) (Signed)
Patient ID: Gerald Rosario, male   DOB: 09/12/1945, 67 y.o.   MRN: [id number] 6 Days Post-Op  Subjective: Ribs still sore but breathing a bit easier  Objective: Vital signs in last 24 hours: Temp:  [98 F (36.7 C)-98.5 F (36.9 C)] 98.1 F (36.7 C) (05/26 0744) Pulse Rate:  [79-100] 96 (05/26 0744) Resp:  [10-15] 14 (05/26 0744) BP: (111-135)/(55-71) 127/61 mmHg (05/26 0744) SpO2:  [95 %-100 %] 95 % (05/26 0841) Weight:  [184 lb 8.4 oz (83.7 kg)] 184 lb 8.4 oz (83.7 kg) (05/26 0348) Last BM Date: 08/17/13  Intake/Output from previous day: 05/25 0701 - 05/26 0700 In: 876.5 [P.O.:240; I.V.:636.5] Out: 1216 [Urine:1150; Chest Tube:66] Intake/Output this shift:    General appearance: cooperative Back: laceration CDI with sutures Chest wall: left sided chest wall tenderness Cardio: regular rate and rhythm GI: soft, NT, ND CV correction Irreg Irreg IS: 1250cc  Lab Results: CBC   Recent Labs  08/25/13 0230 08/26/13 0335  WBC 12.3* 12.5*  HGB 8.5* 8.2*  HCT 24.9* 24.3*  PLT 310 334   BMET  Recent Labs  08/25/13 0230 08/26/13 0335  NA 129* 131*  K 4.6 4.9  CL 96 96  CO2 22 21  GLUCOSE 177* 171*  BUN 32* 34*  CREATININE 1.07 1.05  CALCIUM 8.3* 8.4   PT/INR No results found for this basename: LABPROT, INR,  in the last 72 hours ABG No results found for this basename: PHART, PCO2, PO2, HCO3,  in the last 72 hours  Studies/Results: No results found.  Anti-infectives: Anti-infectives   Start     Dose/Rate Route Frequency Ordered Stop   08/18/13 0645  ceFAZolin (ANCEF) IVPB 1 g/50 mL premix     1 g 100 mL/hr over 30 Minutes Intravenous  Once 08/18/13 0641 08/18/13 0804      Assessment/Plan: Fall  Multiple left rib fxs/hemothorax -- CT in place, TCTS plans VATS to evacuate residual HTX Back lacerations -- Local care ABL anemia -- due to L HTX Syncope - W/U per primary NSTEMI - cardiac cath today   LOS: 8 days    Wylie Russon, MD, MPH,  FACS Trauma: [phone] General Surgery: [phone]  08/26/2013  

## 2013-08-26 NOTE — Clinical Social Work Placement (Addendum)
Clinical Social Work Department CLINICAL SOCIAL WORK PLACEMENT NOTE 08/26/2013  Patient:  Gerald Rosario, Gerald Rosario  Account Number:  [id number] Admit date:  08/18/2013  Clinical Social Worker:  Barbette Or, LCSW  Date/time:  08/26/2013 12:00 N  Clinical Social Work is seeking post-discharge placement for this patient at the following level of care:   SKILLED NURSING   (*CSW will update this form in Epic as items are completed)   08/26/2013  Patient/family provided with [place] Department of Clinical Social Work's list of facilities offering this level of care within the geographic area requested by the patient (or if unable, by the patient's family).  08/26/2013  Patient/family informed of their freedom to choose among providers that offer the needed level of care, that participate in Medicare, Medicaid or managed care program needed by the patient, have an available bed and are willing to accept the patient.  08/26/2013  Patient/family informed of [facility]' ownership interest in [facility], as well as of the fact that they are under no obligation to receive care at this facility.  PASARR submitted to EDS on 08/25/2013 PASARR number received from EDS on 08/25/2013  FL2 transmitted to all facilities in geographic area requested by pt/family on  08/26/2013 FL2 transmitted to all facilities within larger geographic area on   Patient informed that his/her managed care company has contracts with or will negotiate with  certain facilities, including the following:     Patient/family informed of bed offers received:  09/02/13 Patient chooses bed at  Physician recommends and patient chooses bed at    Patient to be transferred to  on   Patient to be transferred to facility by   The following physician request were entered in Epic:   Additional Comments: CSW provided list of bed offers to pt. Pt's friend Hedy Camara to help him make SNF decision. Liaisons from [employer] and  [employer] also to speak with pt. CSW following for discharge planning.

## 2013-08-26 NOTE — Progress Notes (Signed)
Subjective:   VSS.  Hgb stable at 8.2 s/p 1 unit of prbcs on 5/21.  He is down 3.9L since admission.  Chest tube output 39ml yesterday.  Scheduled for cath today and then VATS for clotted left hemothorax.    Objective:   Vital signs in last 24 hours: Filed Vitals:   08/26/13 0608 08/26/13 0744 08/26/13 0841 08/26/13 1230  BP: 133/70 127/61  123/67  Pulse:  96  91  Temp:  98.1 F (36.7 C)  98.7 F (37.1 C)  TempSrc:  Oral  Oral  Resp:  14  17  Height:      Weight:      SpO2:  98% 95% 99%    Weight: Filed Weights   08/23/13 0500 08/25/13 0600 08/26/13 0348  Weight: 189 lb 9.5 oz (86 kg) 184 lb 8.4 oz (83.7 kg) 184 lb 8.4 oz (83.7 kg)    Ins/Outs:  Intake/Output Summary (Last 24 hours) at 08/26/13 1412 Last data filed at 08/26/13 1232  Gross per 24 hour  Intake  853.5 ml  Output   1416 ml  Net -562.5 ml    Physical Exam: Constitutional: Vital signs reviewed.  Patient lying in bed with left chest tube in place; NAD.  HEENT: [place]/AT; PERRL, EOMI, conjunctivae normal, no scleral icterus  Cardiovascular: RRR, no MRG Pulmonary/Chest: mildly increased respiratory effort, minimal accessory muscle use, left chest tube in place; without rales, wheezes, or rhonchi Abdominal: Soft. Mildly distended, +BS, NT/ND Neurological: A&O x3, CN II-XII grossly intact; non-focal exam Musculoskeletal: 15cm laceration R upper back at approximately the T3 level.   Abrasions noted left mid thoracic and back region with TTP. No midline spinal tenderness noted.  Extremities: 2+DP b/l, no C/C, b/l LE trace-1+pitting edema Skin: Warm, dry.    Lab Results:  BMP:  Recent Labs Lab 08/25/13 0230 08/26/13 0335  NA 129* 131*  K 4.6 4.9  CL 96 96  CO2 22 21  GLUCOSE 177* 171*  BUN 32* 34*  CREATININE 1.07 1.05  CALCIUM 8.3* 8.4  MG 3.0* 2.9*   Anion Gap:  11  CBC:  Recent Labs Lab 08/22/13 0425 08/23/13 0315  08/25/13 0230 08/26/13 0335  WBC 9.7 11.3*  < > 12.3* 12.5*    NEUTROABS 8.1* 9.0*  --   --   --   HGB 8.3* 8.2*  < > 8.5* 8.2*  HCT 25.0* 23.5*  < > 24.9* 24.3*  MCV 87.4 85.5  < > 85.3 85.0  PLT 226 246  < > 310 334  < > = values in this interval not displayed.  Coagulation: No results found for this basename: LABPROT, INR,  in the last 168 hours  CBG:            Recent Labs Lab 08/25/13 1021 08/25/13 1219 08/25/13 1700 08/25/13 2024 08/26/13 0743 08/26/13 1229  GLUCAP 199* 185* 139* 141* 137* 123*           HA1C:      No results found for this basename: HGBA1C,  in the last 168 hours  Lipid Panel:  Recent Labs Lab 08/20/13 0433  CHOL 165  HDL 63  LDLCALC 79  TRIG 113  CHOLHDL 2.6    LFTs: No results found for this basename: AST, ALT, ALKPHOS, BILITOT, PROT, ALBUMIN,  in the last 168 hours  Pancreatic Enzymes: No results found for this basename: LIPASE, AMYLASE,  in the last 168 hours  Lactic Acid/Procalcitonin: No results found for this basename: LATICACIDVEN,  PROCALCITON,  in the last 168 hours  Ammonia: No results found for this basename: AMMONIA,  in the last 168 hours  Cardiac Enzymes:  Recent Labs Lab 08/19/13 2247 08/20/13 0433 08/20/13 1010 08/21/13 0259  CKTOTAL  --  352*  --   --   TROPONINI 0.59*  --  0.36* <0.30    EKG: EKG Interpretation  Date/Time:  Monday Aug 18 2013 04:59:50 EDT Ventricular Rate:  78 PR Interval:  169 QRS Duration: 107 QT Interval:  394 QTC Calculation: 449 R Axis:   6 Text Interpretation:  Sinus rhythm Anterior infarct, old ED PHYSICIAN INTERPRETATION AVAILABLE IN [place] Confirmed by TEST, Record ([id number]) on 08/20/2013 7:15:39 AM   BNP: No results found for this basename: PROBNP,  in the last 168 hours  D-Dimer: No results found for this basename: DDIMER,  in the last 168 hours  Urinalysis: No results found for this basename: COLORURINE, APPERANCEUR, LABSPEC, PHURINE, GLUCOSEU, HGBUR, BILIRUBINUR, KETONESUR, PROTEINUR, UROBILINOGEN, NITRITE,  LEUKOCYTESUR,  in the last 168 hours  Micro Results: Recent Results (from the past 240 hour(s))  MRSA PCR SCREENING     Status: None   Collection Time    08/18/13  4:00 PM      Result Value Ref Range Status   MRSA by PCR NEGATIVE  NEGATIVE Final   Comment:            The GeneXpert MRSA Assay (FDA     approved for NASAL specimens     only), is one component of a     comprehensive MRSA colonization     surveillance program. It is not     intended to diagnose MRSA     infection nor to guide or     monitor treatment for     MRSA infections.    Blood Culture: No results found for this basename: sdes,  specrequest,  cult,  reptstatus    Studies/Results: No results found.  Medications:  Scheduled Meds: . aspirin  81 mg Oral Pre-Cath  . aspirin EC  81 mg Oral Daily  . atorvastatin  40 mg Oral q1800  . bacitracin   Topical BID  . diltiazem  240 mg Oral Daily  . feeding supplement (GLUCERNA SHAKE)  237 mL Oral BID BM  . furosemide  40 mg Oral BID  . insulin aspart  0-9 Units Subcutaneous TID WC  . insulin glargine  10 Units Subcutaneous QHS  . methocarbamol  1,500 mg Oral QID  . metoprolol tartrate  25 mg Oral BID  . mometasone-formoterol  2 puff Inhalation BID  . pantoprazole  40 mg Oral Daily  . polyethylene glycol  17 g Oral Daily  . regadenoson  0.4 mg Intravenous Once  . sodium chloride  3 mL Intravenous Q12H  . sodium chloride  3 mL Intravenous Q12H  . sodium chloride  3 mL Intravenous Q12H  . sodium phosphate  1 enema Rectal Once  . Tdap  0.5 mL Intramuscular Once  . traMADol  100 mg Oral 4 times per day   Continuous Infusions: . sodium chloride 1 mL/kg/hr (08/26/13 2536)   PRN Meds: sodium chloride, sodium chloride, acetaminophen, calcium carbonate, ondansetron (ZOFRAN) IV, oxyCODONE, sodium chloride, sodium chloride  Antibiotics: Antibiotics Given (last 72 hours)   None      Day of Hospitalization: 8  Consults: Treatment Team:  Trauma Md,  MD [facility], MD Rexene Alberts, MD  Assessment/Plan:   Principal Problem:   Syncope Active Problems:  HYPERTENSION   Coronary artery disease   Ischemic cardiomyopathy   Diabetes mellitus   Hyperkalemia   Chronic systolic heart failure   Multiple fractures of ribs of left side   Laceration of skin of scalp   Hematoma of occipital surface of head   Chronic sinusitis   Laceration of skin   NSTEMI (non-ST elevated myocardial infarction)   Pleural effusion   Urine retention   Syncope and collapse   Pleural effusion, left   Hemothorax on left   A-fib  Multiple segmental rib fractures with clotted left hemothorax Pain improving.  5/24 CT chest showed moderate amt of residual clotted blood in posterior hemithorax with LLL atx.  CVTS (Dr. Roxy Manns) consulted and is recommending VATS but should undergo diagnostic cath prior to surgery.  Cards scheduled cath for today.    -will need VATS but will undergo cardiac cath prior to surgery (scheduled for Today) -pain management -pulmonary toilet/IS  Acute atrial fibrillation with RVR  HR in 90s this AM.  Has been in a-fib with HR up to 140s; was asymptomatic.  Cardiology  consulted.  TSH wnl.  -metoprolol for rate control to 25mg  bid per cards -no heparin gtt per cards  Syncope  Stable-no further episodes this admission. Trops trended down.  EP consulted and placed implantable loop recorder.  5/19 echo: LVEF 55-60% with LA dilation but unchanged from 2013 echo.   -appreciate EP recs -cath today  NSTEMI Stable.  Trops trended down.  Lipid panel OK.  Cards to do cath today.   -atorvastatin to 40mg  qd -cath today  Acute on chronic normocytic anemia Pt with acute blood loss d/t multiple rib fractures.  Anemia panel revealed normal retic, ferritin wnl, iron low at 13, and TIBC low at 205.  Folate wnl.  B12 low at 287, will get MMA.  Pt never had colonoscopy and will need as outpatient.   -MMA pending -FOBT pending -pt needs  GI follow-up as outpatient  Upper back skin laceration 15 cm laceration at risk for skin necrosis.  Laceration repaired by trama service. -monitor -bacitracin applied bid  Left suboccipital scalp hematoma and laceration -monitor  AKI Resolved.  Baseline is 1.1-1.2.  He was noted to have mild elevated Cr at 1.7 on admission. CK trended down.  -hold lasix -avoid nephrotoxic meds -BMP in am  Leukocytosis Likely stress response.  h/o hypertension  BP stable.  On home lasix, imdur and metoprolol.  -metoprolol 25mg  bid, cardizem 60mg  qid  DMII Lab Results  Component Value Date   HGBA1C 7.4* 08/18/2013    Recent Labs  08/25/13 2024 08/26/13 0743 08/26/13 1229  GLUCAP 141* 137* 123*  -hold home glipizide -Lantus 10 units QHS -SSI  Chronic sinusitis This is an incidental finding on CT head.   -follow-up as outpatient    Deconditioning PT recommending SNF but pt not amenable-would like HH.    Mild hyponatremia Asymptomatic.  -monitor BMP  Hyperkalemia Resolved.  No EKG changes.  Given kayexalate x 2 without any BM.  -BMP in AM  VTE SCDs  Dispo: Disposition is deferred at this time, awaiting improvement of current medical problems. Anticipated discharge in approximately 2-3 day(s).  Pt is not interested in going to inpatient rehab or SNF.  [facility].    The patient does have a current PCP Charolotte Eke Powers) and does need an [facility] hospital follow-up appointment after discharge.   Signed: Jones Bales, MD 08/26/2013, 2:12 PM

## 2013-08-26 NOTE — CV Procedure (Addendum)
CARDIAC CATHETERIZATION REPORT  NAME:  Gerald Rosario   MRN: [id number] DOB:  Aug 20, 1945   ADMIT DATE: 08/18/2013 Procedure Date: 08/26/2013  INTERVENTIONAL CARDIOLOGIST: [name], M.D., MS PRIMARY CARE PROVIDER: Saralyn Pilar PRIMARY CARDIOLOGIST: Kirk Ruths, MD  PATIENT:  Gerald Rosario is a 68 y.o. 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 He has a known occluded LAD after a large septal perforator and diagonal branch, occluded circumflex and severely diseased small RCA. He was admitted to [facility] for an episode of syncope with falling down the stairs with significant trauma with rib fractures now suffering a hemothorax. He did have elevated positive troponins, but denied any anginal symptoms. His EF is preserved with no regional wall motion abnormalities. He is status post chest tube placement. His also had a loop recorder placed to evaluate for possible arrhythmogenic syncope. Evaluate for possible ischemia mediated syncope, he is referred for invasive evaluation and diagnostic cardiac catheterization. Since he is currently still with chest tube in place and recommended no IV heparin, we will have to use a femoral approach to avoid getting heparin with radial access.  PRE-OPERATIVE DIAGNOSIS:    Non-STEMI  Syncope  Known severe CAD  PROCEDURES PERFORMED:    left Heart Catheterization with Native Coronary Angiography  via Right Common Femoral Artery   PROCEDURE: The patient was brought to the 2nd [place] [facility] Lab in the fasting state and prepped and draped in the usual sterile fashion for Right groin access. Sterile technique was used including antiseptics, cap, gloves, gown, hand hygiene, mask and sheet. Skin prep: Chlorhexidine.   Consent: Risks of procedure as well as the alternatives and risks of  each were explained to the (patient/caregiver). Consent for procedure obtained.   Time Out: Verified patient identification, verified procedure, site/side was marked, verified correct patient position, special equipment/implants available, medications/allergies/relevent history reviewed, required imaging and test results available. Performed.  Access:   Right Common Femoral Artery: 5 Fr Sheath -  fluoroscopically guided modified Seldinger Technique   Left Heart Catheterization:  5 Fr Catheters advanced or exchanged over a J-wire; JL4 catheter advanced first.  Left Coronary Artery Cineangiography: JL4 Catheter  Right Coronary Artery Cineangiography: JR 4 Catheter   LV Hemodynamics: JL4  Sheath removed in the holding area with manual pressure for hemostasis.   MEDICATIONS:   ANESTHESIA: Local Lidocaine 12 ml   SEDATION: 1 mg IV Versed, 75 mcg IV fentanyl ;   Omnipaque contrast 85 mL  EBL: < 10 ml  FINDINGS:  Hemodynamics:   Central Aortic Pressure / Mean: 122/63/88 mmHg  Left Ventricular Pressure / LVEDP: 121/7/11  Left Ventriculography: Deferred  Coronary Anatomy:  Left Main: Calcified large-caliber vessel that trifurcates into the LAD, Circumflex, and Ramus Intermedius; no significant disease in the LM. LAD: Moderate caliber vessel with mild diffuse disease proximally. There is a very large septal perforator trunk several branches providing collaterals to the RPDA. As after the septal perforator there is a moderate caliber diagonal branch with proximal irregular 80-90% stenosis in this main branch. The LAD after the diagonal is 100% occluded as previously described with collaterals coming from septal to septal and diagonal to diagonal with retrograde flow up the distal LAD.  Left Circumflex: Again is a moderate large-caliber vessel 100% occluded in the mid AV groove after atrial branch. The distal circumflex "  ghosts in" with collaterals from the Ramus  Ramus intermedius:  Moderate to large caliber vessel that bifurcates proximally into 2 major branches. The proximal portion of this vessel has an eccentric 60-70% stenosis prior to bifurcation. The 2 major branches are relatively free of disease would be more lateral OM side branch having 2 branches.   RCA: Lead previously a moderate caliber vessel now very small and diameter with proximal 90% stenosis followed by a string-like vessel that is occluded in the mid vessel at an RV marginal branch. The RV marginal branch provides collateral flow to what looks like the distal LAD with right to right collaterals also filling the RPDA.    The RPDA and posterior lateral branches provided flow by both right to left and left to right collaterals.  After reviewing the initial angiography, no obvious culprit lesion can be identified but to potential new lesions are the significant lesion in the diagonal branch as well as in the ramus medius branch..  MEDICATIONS:  Anesthesia:  Local Lidocaine 12 ml  Sedation:  1 mg IV Versed, 75 mcg IV fentanyl ;   Omnipaque Contrast: 55 ml  PATIENT DISPOSITION:    The patient was transferred to the PACU holding area in a hemodynamicaly stable, chest pain free condition.  The patient tolerated the procedure well, and there were no complications.  EBL:   < 10 ml  The patient was stable before, during, and after the procedure.  POST-OPERATIVE DIAGNOSIS:    Known severe multivessel CAD as previously described with occluded LAD, circumflex and RCA. The proximal diagonal branch now has an 80-90% stenosis in the proximal ramus intermedius as a 60-70% stenosis prior to bifurcation.  Relatively normal LVEDP  PLAN OF CARE:  Patient will return 2 days nursing unit for post catheterization care. He reviewed the images with colleagues and discuss potential treatment options including PCI of both the ramus and the diagonal versus just a diagonal. Also what could be considered would be bypass  surgery as he has viability in the LAD and circumflex as well as RCA distribution based on cardiac MRI and echocardiographic findings.  Will defer further care to the primary service.   [name], M.D., M.S. [facility] [address] [place], [place]  [address]  [phone]  08/26/2013 4:42 PM

## 2013-08-26 NOTE — Interval H&P Note (Signed)
History and Physical Interval Note:  08/26/2013 3:21 PM  Gerald Rosario  has presented today for surgery, with the diagnosis of Syncope, Known CAD & mild NSTEMI.    The various methods of treatment have been discussed with the patient and family. After consideration of risks, benefits and other options for treatment, the patient has consented to  Procedure(s): LEFT HEART CATHETERIZATION WITH CORONARY ANGIOGRAM (N/A) +/- PCI as a surgical intervention .  The patient's history has been reviewed, patient examined, no change in status, stable for surgery.  I have reviewed the patient's chart and labs.  Questions were answered to the patient's satisfaction.     Gerald Rosario  Cath Lab Visit (complete for each Cath Lab visit)  Clinical Evaluation Leading to the Procedure:   ACS: yes  Non-ACS:    Anginal Classification: No Symptoms  Anti-ischemic medical therapy: Maximal Therapy (2 or more classes of medications)  Non-Invasive Test Results: No non-invasive testing performed  Prior CABG: No previous CABG

## 2013-08-26 NOTE — Clinical Social Work Note (Signed)
Clinical Social Work Department BRIEF PSYCHOSOCIAL ASSESSMENT 08/26/2013  Patient:  Gerald Rosario, Gerald Rosario     Account Number:  [id number]     Admit date:  08/18/2013  Clinical Social Worker:  Myles Lipps  Date/Time:  08/26/2013 12:00 N  Referred by:  Physician  Date Referred:  08/25/2013 Referred for  SNF Placement   Other Referral:   Interview type:  Patient Other interview type:   No family/friends at bedside    PSYCHOSOCIAL DATA Living Status:  ALONE Admitted from facility:   Level of care:   Primary support name:  Glendell Docker [phone] Primary support relationship to patient:  CHILD, ADULT Degree of support available:   Adequate    CURRENT CONCERNS Current Concerns  Post-Acute Placement   Other Concerns:    SOCIAL WORK ASSESSMENT / PLAN Clinical Social Worker met with patient at bedside to offer support and discuss patient needs at discharge.  Patient states that he lives in a two story home alone and had a syncopal episode while going down the stairs.  Patient states that he has an extensive cardiac history and this is not the first occurrence of a fall.  Patient states that he has limited family support in the area but does have a neighbor who frequently checks in on him.  Patient is agreeable to SNF search in [facility] but feels strongly that he will be returning home with home health at discharge.  CSW to initiate search and follow up with patient regarding potential bed offers.  CSW remains available for support and to facilitate patient discharge needs once medically ready.   Assessment/plan status:  Psychosocial Support/Ongoing Assessment of Needs Other assessment/ plan:   Information/referral to community resources:   Clinical Social Worker to provide patient with facility list once bed offers are available.  Patient open to the idea of home health far more than SNF placement at discharge    PATIENT'S/FAMILY'S RESPONSE TO PLAN OF CARE: Patient alert  and oriented x3 laying in bed.  Patient states that he enjoys living at home alone and would like to return home alone with home health following.  Patient reluctantly agreed to SNF search as an option but will likely not agree to placement at discharge.  Patient with very limited support at home and likely would not even have intermittent supervision.  Patient is not currently realistic about his potential needs at discharge.  Patient verbalized his understanding of CSW role and appreciation for support.

## 2013-08-26 NOTE — Progress Notes (Signed)
Agree with medical student note see Dr. Gills note for more details.   [place] MD 

## 2013-08-26 NOTE — Progress Notes (Signed)
PT Cancellation Note  Patient Details Name: Gerald Rosario MRN: [id number] DOB: Jul 29, 1945   Cancelled Treatment:    Reason Eval/Treat Not Completed: Patient declined, no reason specified.  Pt states he doesn't want to be bothered with getting up, although he admits he would like to sit up in the chair. RN present, and both RN and PT provided encouragement to participate, educating pt on benefits of OOB. Will continue to follow.   Jolyn Lent 08/26/2013, 10:56 AM  Jolyn Lent, PT, DPT Acute Rehabilitation Services Pager: [phone]

## 2013-08-26 NOTE — Progress Notes (Signed)
Patient ID: Gerald Rosario, male   DOB: [birth year], 68 y.o.   MRN: [id number] 6 Days Post-Op  Subjective: Ribs still sore but breathing a bit easier  Objective: Vital signs in last 24 hours: Temp:  [98 F (36.7 C)-98.5 F (36.9 C)] 98.1 F (36.7 C) (05/26 0744) Pulse Rate:  [79-100] 96 (05/26 0744) Resp:  [10-15] 14 (05/26 0744) BP: (111-135)/(55-71) 127/61 mmHg (05/26 0744) SpO2:  [95 %-100 %] 95 % (05/26 0841) Weight:  [184 lb 8.4 oz (83.7 kg)] 184 lb 8.4 oz (83.7 kg) (05/26 0348) Last BM Date: 08/17/13  Intake/Output from previous day: 05/25 0701 - 05/26 0700 In: 876.5 [P.O.:240; I.V.:636.5] Out: 1216 [Urine:1150; Chest Tube:66] Intake/Output this shift:    General appearance: cooperative Back: laceration CDI with sutures Chest wall: left sided chest wall tenderness Cardio: regular rate and rhythm GI: soft, NT, ND CV correction Irreg Irreg IS: 1250cc  Lab Results: CBC   Recent Labs  08/25/13 0230 08/26/13 0335  WBC 12.3* 12.5*  HGB 8.5* 8.2*  HCT 24.9* 24.3*  PLT 310 334   BMET  Recent Labs  08/25/13 0230 08/26/13 0335  NA 129* 131*  K 4.6 4.9  CL 96 96  CO2 22 21  GLUCOSE 177* 171*  BUN 32* 34*  CREATININE 1.07 1.05  CALCIUM 8.3* 8.4   PT/INR No results found for this basename: LABPROT, INR,  in the last 72 hours ABG No results found for this basename: PHART, PCO2, PO2, HCO3,  in the last 72 hours  Studies/Results: No results found.  Anti-infectives: Anti-infectives   Start     Dose/Rate Route Frequency Ordered Stop   08/18/13 0645  ceFAZolin (ANCEF) IVPB 1 g/50 mL premix     1 g 100 mL/hr over 30 Minutes Intravenous  Once 08/18/13 6962 08/18/13 9528      Assessment/Plan: Fall  Multiple left rib fxs/hemothorax -- CT in place, TCTS plans VATS to evacuate residual HTX Back lacerations -- Local care ABL anemia -- due to L HTX Syncope - W/U per primary NSTEMI - cardiac cath today   LOS: 8 days    Georganna Skeans, MD, MPH,  FACS Trauma: [phone] General Surgery: [phone]  08/26/2013

## 2013-08-26 NOTE — Progress Notes (Signed)
Pt. Was trying to go in the commode, appears uncomfortable, abdomen softly distended no BM for several days inspite of several laxatives. Dr. Ronnald Ramp on call was paged with orders made. After the order was placed the patient finally was able to have bowel movement moderate amount formed hard stool.  Pt. Felt better after. Refused the enema for now. Will cont. to monitor.

## 2013-08-26 NOTE — Progress Notes (Signed)
    Day 8 of stay      Patient name: Gerald Rosario  Medical record number: [id number]  Date of birth: Nov 03, 1945   Met with patient this morning. Pain is better. No palpitations or dysnea. Walked with PT. Exam - diminished breath sounds on left side. Labs and imaging reviewed.  Assessment and Plan   NSTEMI - Scheduled for a cath today per cardiology.  Afib - rate controlled, not on anticoagulation.   Rib fractures - pain better controlled.   Syncope - monitored on loop recorder. No further episodes.  Hemothorax - Trauma to consider VATS after cath to evacuate.  Disposition - Discussed SNF options, patient is also considering home, but has not ruled out SNF. Will decide close to discharge time.  Diabetes - well controlled.   I have discussed the care of this patient with my IM team residents. Please see the resident note for details.  Jeorgia Helming 08/26/2013, 1:13 PM.

## 2013-08-26 NOTE — Progress Notes (Signed)
Patient ID: Gerald Rosario, male   DOB: 23-Jan-1946, 68 y.o.   MRN: [id number]    Subjective:  Denies SSCP, palpitations or dyspnea Chest tube sore  Objective:  Filed Vitals:   08/25/13 2334 08/26/13 0348 08/26/13 0608 08/26/13 0744  BP: 129/58 135/62 133/70 127/61  Pulse: 100 79  96  Temp: 98.5 F (36.9 C) 98.5 F (36.9 C)  98.1 F (36.7 C)  TempSrc: Oral Oral  Oral  Resp: 15 12  14   Height:      Weight:  184 lb 8.4 oz (83.7 kg)    SpO2: 98% 98%  98%    Intake/Output from previous day:  Intake/Output Summary (Last 24 hours) at 08/26/13 6295 Last data filed at 08/26/13 0700  Gross per 24 hour  Intake  866.5 ml  Output   1216 ml  Net -349.5 ml    Physical Exam: Affect appropriate HEENT: normal Neck supple  Lungs Diminished BS throughout left lung field Heart:  Irregular Abdomen: soft, NT/ND No edema Neuro non-focal Skin warm and dry    Lab Results: Basic Metabolic Panel:  Recent Labs  08/25/13 0230 08/26/13 0335  NA 129* 131*  K 4.6 4.9  CL 96 96  CO2 22 21  GLUCOSE 177* 171*  BUN 32* 34*  CREATININE 1.07 1.05  CALCIUM 8.3* 8.4  MG 3.0* 2.9*   CBC:  Recent Labs  08/25/13 0230 08/26/13 0335  WBC 12.3* 12.5*  HGB 8.5* 8.2*  HCT 24.9* 24.3*  MCV 85.3 85.0  PLT 310 334   Imaging: Imaging results have been reviewed and Ct Chest W Contrast  08/24/2013   CLINICAL DATA:  Syncopal episode with fall resulting in multiple rib fractures and left hemothorax.  EXAM: CT CHEST WITH CONTRAST  TECHNIQUE: Multidetector CT imaging of the chest was performed during intravenous contrast administration.  CONTRAST:  9mL OMNIPAQUE IOHEXOL 300 MG/ML  SOLN  COMPARISON:  Radiographs 08/24/2013 and 08/23/2013.  CT 08/18/2013.  FINDINGS: The mediastinum has a stable appearance. There is no evidence of mediastinal hematoma. Atherosclerosis of the aorta, great vessels and coronary arteries is noted. Minimal heterogeneity of the thyroid gland appear stable.  Anteriorly  inserted left-sided chest tube is likely within the major fissure. There is increased volume loss in the left hemithorax with subtotal collapse of the left lower lobe. There are associated air bronchograms. There is patchy atelectasis within the left upper lobe. In addition, patchy peripheral ground-glass opacities are present throughout the right lung. These may reflect atelectasis or contusion.  There is complex medium density fluid posteriorly in the left hemithorax consistent with hemothorax. This is primarily posterior and inferior to the tip of the chest tube. There is no significant pleural fluid on the right. There is no pericardial effusion or pneumothorax.  Multiple segmental left-sided rib fractures are again noted. The posterior components remain mildly displaced. There is no evidence of vertebral body or sternal fracture.  The visualized upper abdomen has a stable appearance. There is no evidence of splenic injury or perisplenic hematoma.  IMPRESSION: 1. Interval development of moderate-sized hemothorax posteriorly on the left. The left chest tube appears to be within the major fissure and may not adequately drain this fluid. 2. Increasing left lower lobe collapse with patchy ground-glass opacities in the left upper lobe and right lung. 3. No evidence of mediastinal hematoma or pneumothorax. 4. Multiple segmental left-sided rib fractures again noted.   Electronically Signed   By: Camie Patience M.D.   On: 08/24/2013  12:48    Cardiac Studies:   Telemetry:  afib rates rate 80s  Echo:  EF 60-65% moderate LAE   Medications:   . aspirin  81 mg Oral Pre-Cath  . aspirin EC  81 mg Oral Daily  . atorvastatin  40 mg Oral q1800  . bacitracin   Topical BID  . diltiazem  60 mg Oral 4 times per day  . feeding supplement (GLUCERNA SHAKE)  237 mL Oral BID BM  . insulin aspart  0-9 Units Subcutaneous TID WC  . insulin glargine  10 Units Subcutaneous QHS  . methocarbamol  1,500 mg Oral QID  . metoprolol  tartrate  25 mg Oral BID  . mometasone-formoterol  2 puff Inhalation BID  . pantoprazole  40 mg Oral Daily  . polyethylene glycol  17 g Oral Daily  . regadenoson  0.4 mg Intravenous Once  . sodium chloride  3 mL Intravenous Q12H  . sodium chloride  3 mL Intravenous Q12H  . sodium chloride  3 mL Intravenous Q12H  . sodium phosphate  1 enema Rectal Once  . Tdap  0.5 mL Intramuscular Once  . traMADol  100 mg Oral 4 times per day     . sodium chloride 1 mL/kg/hr (08/26/13 0947)    Assessment/Plan:  Afib:  Continue rate control with metoprolol and cardizem (change to cardizem CD 240 mg daily); no anticoagulation given hemothorax. Once hemothorax treated/evacuated, will begin anticoagulation and proceed with DCCV if atrial fibrillation persists.  CAD/NSTEMI:  Continue ASA, metoprolol and statin; for cath today (risks and benefits discussed and patient agrees to proceed).  Syncope:  Etiology not clear.  ILR placed by EP.  Previous EP study by Caryl Comes not inducable. LV function normal.  Chol:  Continue statin   Hemothorax: Dr Roxy Manns following; for surgical evacuation once coronary artery disease addressed.  [name] 08/26/2013, 8:28 AM

## 2013-08-26 NOTE — Progress Notes (Signed)
OT Cancellation Note  Patient Details Name: Gerald Rosario MRN: [id number] DOB: 05-24-1945   Cancelled Treatment:    Reason Eval/Treat Not Completed: Patient declined, no reason specified (pending cath today) Pt declined OT reporting "I rather not go through the trouble of all the lines and just wait until it's time to go."  OT attempted to educate patient on benefits of OOB with therapy during the AM prior to procedure. Pt thanks therapist and declines. OT to follow acutely and reattempt at later date/ time as appropriate.  Peri Maris Pager: [phone]  08/26/2013, 8:27 AM

## 2013-08-27 ENCOUNTER — Other Ambulatory Visit: Payer: Self-pay | Admitting: *Deleted

## 2013-08-27 ENCOUNTER — Inpatient Hospital Stay (HOSPITAL_COMMUNITY): Payer: Medicare Other

## 2013-08-27 DIAGNOSIS — Z01810 Encounter for preprocedural cardiovascular examination: Secondary | ICD-10-CM

## 2013-08-27 DIAGNOSIS — I2510 Atherosclerotic heart disease of native coronary artery without angina pectoris: Secondary | ICD-10-CM

## 2013-08-27 DIAGNOSIS — J90 Pleural effusion, not elsewhere classified: Secondary | ICD-10-CM

## 2013-08-27 LAB — BLOOD GAS, ARTERIAL
Acid-base deficit: 3 mmol/L — ABNORMAL HIGH (ref 0.0–2.0)
Bicarbonate: 20.2 mEq/L (ref 20.0–24.0)
Drawn by: 39866
O2 Content: 2 L/min
O2 Saturation: 92.4 %
Patient temperature: 98.6
TCO2: 21.1 mmol/L (ref 0–100)
pCO2 arterial: 29.1 mmHg — ABNORMAL LOW (ref 35.0–45.0)
pH, Arterial: 7.457 — ABNORMAL HIGH (ref 7.350–7.450)
pO2, Arterial: 65.5 mmHg — ABNORMAL LOW (ref 80.0–100.0)

## 2013-08-27 LAB — CBC
HCT: 25.3 % — ABNORMAL LOW (ref 39.0–52.0)
Hemoglobin: 8.3 g/dL — ABNORMAL LOW (ref 13.0–17.0)
MCH: 28.2 pg (ref 26.0–34.0)
MCHC: 32.8 g/dL (ref 30.0–36.0)
MCV: 86.1 fL (ref 78.0–100.0)
Platelets: 366 10*3/uL (ref 150–400)
RBC: 2.94 MIL/uL — ABNORMAL LOW (ref 4.22–5.81)
RDW: 14.8 % (ref 11.5–15.5)
WBC: 11 10*3/uL — ABNORMAL HIGH (ref 4.0–10.5)

## 2013-08-27 LAB — BASIC METABOLIC PANEL
BUN: 23 mg/dL (ref 6–23)
CO2: 19 mEq/L (ref 19–32)
Calcium: 8.4 mg/dL (ref 8.4–10.5)
Chloride: 99 mEq/L (ref 96–112)
Creatinine, Ser: 0.9 mg/dL (ref 0.50–1.35)
GFR calc Af Amer: >90 mL/min (ref >90)
GFR calc non Af Amer: 85 mL/min — ABNORMAL LOW (ref >90)
Glucose, Bld: 120 mg/dL — ABNORMAL HIGH (ref 70–99)
Potassium: 4.5 mEq/L (ref 3.7–5.3)
Sodium: 133 mEq/L — ABNORMAL LOW (ref 137–147)

## 2013-08-27 LAB — PREPARE RBC (CROSSMATCH)

## 2013-08-27 LAB — URINALYSIS, ROUTINE W REFLEX MICROSCOPIC
Bilirubin Urine: NEGATIVE
Glucose, UA: NEGATIVE mg/dL
Ketones, ur: 15 mg/dL — AB
Nitrite: NEGATIVE
Protein, ur: NEGATIVE mg/dL
Specific Gravity, Urine: 1.02 (ref 1.005–1.030)
Urobilinogen, UA: 1 mg/dL (ref 0.0–1.0)
pH: 5 (ref 5.0–8.0)

## 2013-08-27 LAB — URINE MICROSCOPIC-ADD ON

## 2013-08-27 LAB — GLUCOSE, CAPILLARY
Glucose-Capillary: 118 mg/dL — ABNORMAL HIGH (ref 70–99)
Glucose-Capillary: 165 mg/dL — ABNORMAL HIGH (ref 70–99)
Glucose-Capillary: 166 mg/dL — ABNORMAL HIGH (ref 70–99)
Glucose-Capillary: 143 mg/dL — ABNORMAL HIGH (ref 70–99)

## 2013-08-27 LAB — MAGNESIUM: Magnesium: 2.6 mg/dL — ABNORMAL HIGH (ref 1.5–2.5)

## 2013-08-27 MED ORDER — EPINEPHRINE HCL 1 MG/ML IJ SOLN
0.5000 ug/min | INTRAVENOUS | Status: DC
  Filled 2013-08-27: qty 4

## 2013-08-27 MED ORDER — MAGNESIUM SULFATE 50 % IJ SOLN
40.0000 meq | INTRAMUSCULAR | Status: DC
  Filled 2013-08-27: qty 10

## 2013-08-27 MED ORDER — VANCOMYCIN HCL 10 G IV SOLR
1500.0000 mg | INTRAVENOUS | Status: AC
  Administered 2013-08-28: 1500 mg via INTRAVENOUS
  Filled 2013-08-27: qty 1500

## 2013-08-27 MED ORDER — DEXMEDETOMIDINE HCL IN NACL 400 MCG/100ML IV SOLN
0.1000 ug/kg/h | INTRAVENOUS | Status: DC
  Filled 2013-08-27: qty 100

## 2013-08-27 MED ORDER — DEXTROSE 5 % IV SOLN
1.5000 g | INTRAVENOUS | Status: AC
  Administered 2013-08-28: 1.5 g via INTRAVENOUS
  Administered 2013-08-28: .75 g via INTRAVENOUS
  Filled 2013-08-27: qty 1.5

## 2013-08-27 MED ORDER — POTASSIUM CHLORIDE 2 MEQ/ML IV SOLN
80.0000 meq | INTRAVENOUS | Status: DC
  Filled 2013-08-27 (×2): qty 40

## 2013-08-27 MED ORDER — INSULIN REGULAR HUMAN 100 UNIT/ML IJ SOLN
INTRAMUSCULAR | Status: DC
  Filled 2013-08-27: qty 1

## 2013-08-27 MED ORDER — DEXTROSE 5 % IV SOLN
30.0000 ug/min | INTRAVENOUS | Status: DC
  Filled 2013-08-27: qty 2

## 2013-08-27 MED ORDER — CHLORHEXIDINE GLUCONATE CLOTH 2 % EX PADS
6.0000 | MEDICATED_PAD | Freq: Once | CUTANEOUS | Status: AC
  Administered 2013-08-27: 6 via TOPICAL

## 2013-08-27 MED ORDER — TEMAZEPAM 15 MG PO CAPS
15.0000 mg | ORAL_CAPSULE | Freq: Once | ORAL | Status: AC | PRN
  Administered 2013-08-27: 15 mg via ORAL
  Filled 2013-08-27: qty 1

## 2013-08-27 MED ORDER — DEXTROSE 5 % IV SOLN
750.0000 mg | INTRAVENOUS | Status: DC
  Filled 2013-08-27: qty 750

## 2013-08-27 MED ORDER — NITROGLYCERIN IN D5W 200-5 MCG/ML-% IV SOLN
2.0000 ug/min | INTRAVENOUS | Status: DC
  Filled 2013-08-27: qty 250

## 2013-08-27 MED ORDER — CHLORHEXIDINE GLUCONATE CLOTH 2 % EX PADS: 6.0000 | MEDICATED_PAD | Freq: Once | CUTANEOUS | Status: DC

## 2013-08-27 MED ORDER — VANCOMYCIN HCL 1000 MG IV SOLR
INTRAVENOUS | Status: AC
  Administered 2013-08-28: 13:00:00
  Filled 2013-08-27: qty 1000

## 2013-08-27 MED ORDER — METOPROLOL TARTRATE 12.5 MG HALF TABLET
12.5000 mg | ORAL_TABLET | Freq: Once | ORAL | Status: DC
  Filled 2013-08-27: qty 1

## 2013-08-27 MED ORDER — SODIUM CHLORIDE 0.9 % IV SOLN
INTRAVENOUS | Status: DC
Start: 2013-08-28 — End: 2013-08-28
  Filled 2013-08-27: qty 40

## 2013-08-27 MED ORDER — SODIUM CHLORIDE 0.9 % IV SOLN
INTRAVENOUS | Status: DC
  Filled 2013-08-27: qty 30

## 2013-08-27 MED ORDER — DOPAMINE-DEXTROSE 3.2-5 MG/ML-% IV SOLN
2.0000 ug/kg/min | INTRAVENOUS | Status: DC
  Filled 2013-08-27: qty 250

## 2013-08-27 MED ORDER — CHLORHEXIDINE GLUCONATE 4 % EX LIQD
CUTANEOUS | Status: AC
  Administered 2013-08-27: 23:00:00
  Filled 2013-08-27: qty 15

## 2013-08-27 MED ORDER — PLASMA-LYTE 148 IV SOLN
INTRAVENOUS | Status: AC
  Administered 2013-08-28: 13:00:00
  Filled 2013-08-27: qty 2.5

## 2013-08-27 MED ORDER — BISACODYL 5 MG PO TBEC: 5.0000 mg | DELAYED_RELEASE_TABLET | Freq: Once | ORAL | Status: DC

## 2013-08-27 NOTE — Progress Notes (Signed)
Pre-op Cardiac Surgery  Carotid Findings:  Performed 08/18/2013, results can be found in [facility].  Upper Extremity Right Left  Brachial Pressures 136-Triphasic 139-Triphasic  Radial Waveforms Triphasic Triphasic  Ulnar Waveforms Biphasic Triphasic  Palmar Arch (Allen's Test) Within normal limits. Within normal limits.     Lower  Extremity Right Left  Dorsalis Pedis 126-Biphasic 115-Monophasic  Anterior Tibial    Posterior Tibial 104-Biphasic 106-Biphasic  Ankle/Brachial Indices 0.91 0.83    Findings:   Bilateral ABIs are suggestive of mild arterial insufficiency.  08/27/2013 7:07 PM Maudry Mayhew, RVT, RDCS, RDMS

## 2013-08-27 NOTE — Progress Notes (Signed)
SUBJECTIVE: No chest pain.   BP 132/59  Pulse 94  Temp(Src) 99 F (37.2 C) (Oral)  Resp 20  Ht 5\' 7"  (1.702 m)  Wt 185 lb 3 oz (84 kg)  BMI 29.00 kg/m2  SpO2 98%  Intake/Output Summary (Last 24 hours) at 08/27/13 3086 Last data filed at 08/27/13 5784  Gross per 24 hour  Intake  251.1 ml  Output   3000 ml  Net -2748.9 ml    PHYSICAL EXAM General: Well developed, well nourished, in no acute distress. Alert and oriented x 3.  Psych:  Good affect, responds appropriately Neck: No JVD. No masses noted.  Lungs: Clear bilaterally anterior  Heart: Irreg irreg with no murmurs noted. Abdomen: Bowel sounds are present. Soft, non-tender.  Extremities: No lower extremity edema. Right groin without hematoma  LABS: Basic Metabolic Panel:  Recent Labs  08/25/13 0230 08/26/13 0335  NA 129* 131*  K 4.6 4.9  CL 96 96  CO2 22 21  GLUCOSE 177* 171*  BUN 32* 34*  CREATININE 1.07 1.05  CALCIUM 8.3* 8.4  MG 3.0* 2.9*   CBC:  Recent Labs  08/25/13 0230 08/26/13 0335  WBC 12.3* 12.5*  HGB 8.5* 8.2*  HCT 24.9* 24.3*  MCV 85.3 85.0  PLT 310 334    Current Meds: . aspirin EC  81 mg Oral Daily  . atorvastatin  40 mg Oral q1800  . bacitracin   Topical BID  . diltiazem  240 mg Oral Daily  . feeding supplement (GLUCERNA SHAKE)  237 mL Oral BID BM  . furosemide  40 mg Oral BID  . insulin aspart  0-9 Units Subcutaneous TID WC  . insulin glargine  10 Units Subcutaneous QHS  . methocarbamol  1,500 mg Oral QID  . metoprolol tartrate  25 mg Oral BID  . mometasone-formoterol  2 puff Inhalation BID  . pantoprazole  40 mg Oral Daily  . polyethylene glycol  17 g Oral Daily  . regadenoson  0.4 mg Intravenous Once  . sodium chloride  3 mL Intravenous Q12H  . sodium chloride  3 mL Intravenous Q12H  . sodium phosphate  1 enema Rectal Once  . Tdap  0.5 mL Intramuscular Once  . traMADol  100 mg Oral 4 times per day   ASSESSMENT AND PLAN:  1. CAD/NSTEMI: Pt is s/p diagnostic  cath 08/26/13 per Dr. Ellyn Hack. He is once again found to have multi-vessel CAD with progression of disease in the large caliber intermediate branch and the small to moderate caliber Diagonal branch, CTO of LAD and Circumflex and small RCA that is diffusely diseased. I have discussed the case this am with Dr. Ellyn Hack. With anemia and hemothorax, he is not a candidate for PCI. It is not even clear if PCI of these moderately diseased branches would provide any benefit. Given multi-vessel CAD with normal LV function, he may be best treated with CABG. CT surgery is following his hemothorax. Will ask Dr. Roxy Manns to review his cath films and decide if CABG is an option once hemothorax is resolved. For now continue ASA, statin, beta blocker.   2. Atrial fib: Continue rate control with metoprolol and cardizem. No anticoagulation given hemothorax. Once hemothorax treated/evacuated, will begin anticoagulation and proceed with DCCV if atrial fibrillation persists.   3. Syncope: Etiology not clear. ILR placed by EP. Previous EP study by Caryl Comes not inducable. LV function normal.   4. Hemothorax: Dr Roxy Manns following. Further plans per CT surgery service.  Gerald Rosario  5/27/20157:29 AM

## 2013-08-27 NOTE — Progress Notes (Signed)
Physical Therapy Treatment Patient Details Name: Gerald Rosario MRN: [id number] DOB: 02-01-1946 Today's Date: 08/27/2013    History of Present Illness Pt is a 68 y/o male admitted s/p syncope and fall at home, in which he sustained L sided rib fractures and a laceration to his upper thoracic area.    PT Comments    Pt progressing with mobility but fatigues quickly with ambulation as well as experiencing O2 desaturation into low 80's on RA. Pt ambulated 100' and 22' with RW and min A. Encouraged him to ambulate with nursing staff as well and to perform HEP in chair between sessions. PT will continue to follow.   Follow Up Recommendations  SNF     Equipment Recommendations  Rolling walker with 5" wheels    Recommendations for Other Services       Precautions / Restrictions Precautions Precautions: Fall Precaution Comments: L rib fractures, mid-line upper thoracic laceration, chest tube, disconnected for therapy today.  Restrictions Weight Bearing Restrictions: No    Mobility  Bed Mobility               General bed mobility comments: pt in recliner upon arrival of PT  Transfers Overall transfer level: Needs assistance Equipment used: Rolling walker (2 wheeled) Transfers: Sit to/from Stand Sit to Stand: Supervision         General transfer comment: pt able to stand without physical assist, supervision for safety as pt reports being sore all over  Ambulation/Gait Ambulation/Gait assistance: Min guard;Min assist Ambulation Distance (Feet): 150 Feet (100', seated rest, 50') Assistive device: Rolling walker (2 wheeled) Gait Pattern/deviations: Decreased stride length;Decreased weight shift to right;Decreased weight shift to left Gait velocity: decreased   General Gait Details: pt with guarded gait due to soreness. Min-guard A at first but pt's legs fatigued very quickly and became more unsteady and required min A for safety. Min A given for second ambulation as  well. Pt very motivated to ambulate but quick fatigue and incraased DOE, O2 sats down to 87% on RA. O2 reapplied between walks with pursed lip breathing. O2 sats up to 98% before second walk. Back down to 87% off O2 and then bounced back to 98% when O2 reapplied and rest given.    Stairs            Wheelchair Mobility    Modified Rankin (Stroke Patients Only)       Balance Overall balance assessment: Needs assistance Sitting-balance support: No upper extremity supported Sitting balance-Leahy Scale: Good     Standing balance support: Single extremity supported;During functional activity Standing balance-Leahy Scale: Fair Standing balance comment: Can stand without UE support in static position but requires UE support for dynamic activity                    Cognition Arousal/Alertness: Awake/alert Behavior During Therapy: WFL for tasks assessed/performed Overall Cognitive Status: Within Functional Limits for tasks assessed                 General Comments: pt cognitively appropriate today and showed appropriate insight into limitations and deficits    Exercises General Exercises - Lower Extremity Ankle Circles/Pumps: 15 reps;Both Long Arc Quad: 20 reps;Both Hip Flexion/Marching: Both;15 reps    General Comments        Pertinent Vitals/Pain See ambulation/ gait for O2 sats Pt c/o of "achiness all over" from bruising    Home Living  Prior Function            PT Goals (current goals can now be found in the care plan section) Acute Rehab PT Goals Patient Stated Goal: to get out of hospital PT Goal Formulation: With patient Time For Goal Achievement: 09/10/13 Potential to Achieve Goals: Good Progress towards PT goals: Progressing toward goals    Frequency  Min 2X/week    PT Plan Current plan remains appropriate    Co-evaluation             End of Session Equipment Utilized During Treatment: Oxygen;Gait  belt Activity Tolerance: Patient tolerated treatment well Patient left: in chair;with call bell/phone within reach     Time: 1401-1436 PT Time Calculation (min): 35 min  Charges:  $Gait Training: 23-37 mins                    G Codes:     Leighton Roach, PT  Acute Rehab Services  [place] 08/27/2013, 4:05 PM

## 2013-08-27 NOTE — Progress Notes (Signed)
Subjective: Patient was sitting in chair, feeling much better, had his CATH. Yesterday,appetite in improving. Still having mild left chest discomfort with movement, left CT in place. He was well aware of his CATH. Results and card. Recommendation about CABG and said that once recovered from his current injuries, will make decision with his daughter's help. Objective: Vital signs in last 24 hours: Filed Vitals:   08/26/13 2349 08/27/13 0300 08/27/13 0611 08/27/13 1311  BP: 167/79  132/59 130/78  Pulse: 108  94 95  Temp: 98.1 F (36.7 C)  99 F (37.2 C) 98.6 F (37 C)  TempSrc: Oral  Oral Oral  Resp: 17  20 20   Height:      Weight:  84 kg (185 lb 3 oz)    SpO2: 100%  98% 100%   Weight change: 0.3 kg (10.6 oz)  Intake/Output Summary (Last 24 hours) at 08/27/13 1320 Last data filed at 08/27/13 0900  Gross per 24 hour  Intake  491.1 ml  Output   2800 ml  Net -2308.9 ml  EXAM:  GENERAL: Alert and oriented , comfortable.  HEENT: PERRL, EOMI  NECK: Supple, no JVD, no adenopathy  CHEST: left Ct in place, no erythema, or swelling.mild drainage along the CT incision site.  LUNG: Clear B/L with much improved air entry as compare to before. Still mildly decreased breath sound at left base. HEART: RRR, S1 and S@, no MRG  ABD: S/ND/NT. No HSM. BS +  NEURO;A & O X 3. Moving all extremities, no gross neuro. Deficit  EXT: mild pedal edema,no cyanosis, PP 2+ B/L      Lab Results: @LABTEST2 @ Micro Results: Recent Results (from the past 240 hour(s))  MRSA PCR SCREENING     Status: None   Collection Time    08/18/13  4:00 PM      Result Value Ref Range Status   MRSA by PCR NEGATIVE  NEGATIVE Final   Comment:            The GeneXpert MRSA Assay (FDA     approved for NASAL specimens     only), is one component of a     comprehensive MRSA colonization     surveillance program. It is not     intended to diagnose MRSA     infection nor to guide or     monitor treatment for     MRSA  infections.   Studies/Results: Dg Chest Port 1 View  08/27/2013   CLINICAL DATA:  Chest tube.  EXAM: PORTABLE CHEST - 1 VIEW  COMPARISON:  Chest CT 08/24/2013.  Chest x-ray 08/24/2013.  FINDINGS: Left chest tube in stable position . Mediastinum and hilar structures are normal. Stable cardiomegaly with normal pulmonary vascularity. Stable left lower lobe atelectasis and/or infiltrate and left-sided pleural fluid collection consistent with previously identified hemothorax. Multiple left rib fractures are better demonstrated by CT. No pneumothorax.  IMPRESSION: 1. Stable chest tube positioning. 2. Stable left hemothorax. 3. Stable left lower lobe atelectasis and/or infiltrate. 4. Multiple left rib fractures, better seen by prior chest CT of 08/24/2013.   Electronically Signed   By: Marcello Moores  Register   On: 08/27/2013 07:29   Medications: medication reviewed Scheduled Meds: . aspirin EC  81 mg Oral Daily  . atorvastatin  40 mg Oral q1800  . bacitracin   Topical BID  . diltiazem  240 mg Oral Daily  . feeding supplement (GLUCERNA SHAKE)  237 mL Oral BID BM  . furosemide  40 mg  Oral BID  . insulin aspart  0-9 Units Subcutaneous TID WC  . insulin glargine  10 Units Subcutaneous QHS  . methocarbamol  1,500 mg Oral QID  . metoprolol tartrate  25 mg Oral BID  . mometasone-formoterol  2 puff Inhalation BID  . pantoprazole  40 mg Oral Daily  . polyethylene glycol  17 g Oral Daily  . regadenoson  0.4 mg Intravenous Once  . sodium chloride  3 mL Intravenous Q12H  . sodium chloride  3 mL Intravenous Q12H  . sodium phosphate  1 enema Rectal Once  . Tdap  0.5 mL Intramuscular Once  . traMADol  100 mg Oral 4 times per day   Continuous Infusions:  PRN Meds:.sodium chloride, acetaminophen, calcium carbonate, ondansetron (ZOFRAN) IV, oxyCODONE, sodium chloride Assessment/Plan: A FIB: His rate in between 92 - 94 today, improved, asymptomatic, not on anticoagulation due to his hemothorax. Continue with Cardizem  and metoprolol for rate control, Card. Advice putting him on anticoagulation once hemothorax resolved and proceed with DCCV if A fib persists. LEFT HEMOTHORAX: Still having some blood mixed serous discharge, his CXR shows same LL atelectasis vs infiltrate, possible a blood clot, CT have seen him and advised a possible VATS later this week.Repeat CBC tomorrow. NSTMI: Had his CATH. Yesterday which shows  multi-vessel CAD with progression of disease in the large caliber intermediate branch and the small to moderate caliber Diagonal branch, CTO of LAD and Circumflex and small RCA that is diffusely diseased. Card. Is recommending CABG once recovered from current injuries and hemothorax.  SYNCOPE: No new episode, feeling better. Already have his LINQ recorder.Continue monitoring. RIB FRACTURE: His pain has improved , states that some time have pain of 2-3 intensity with movements.Continue with pain management and PT/OT to help him heel.  BACK LACERATION:Continue with wound care.  HTN: His BP was 138/70 today. Continue with same meds. DM: His CBG looks good today with 120 this morning. Continue with novo lox and lantus. URINARY RETENTION : Still with Foley's, very reluctant to take it off, should try to remove foley's and see if he can void normally.         This is a Careers information officer Note.  The care of the patient was discussed with Dr. Gordy Levan and the assessment and plan formulated with their assistance.  Please see their attached note for official documentation of the daily encounter.   LOS: 9 days   Lorella Nimrod, Med Student 08/27/2013, 1:20 PM

## 2013-08-27 NOTE — Progress Notes (Signed)
Patient ID: Gerald Rosario, male   DOB: [birth year], 68 y.o.   MRN: [id number] 1 Day Post-Op  Subjective: Feels about the same  Objective: Vital signs in last 24 hours: Temp:  [98.1 F (36.7 C)-99 F (37.2 C)] 99 F (37.2 C) (05/27 6270) Pulse Rate:  [52-111] 94 (05/27 0611) Resp:  [15-20] 20 (05/27 0611) BP: (123-167)/(59-106) 132/59 mmHg (05/27 0611) SpO2:  [90 %-100 %] 98 % (05/27 0611) Weight:  [185 lb 3 oz (84 kg)] 185 lb 3 oz (84 kg) (05/27 0300) Last BM Date: 08/25/13  Intake/Output from previous day: 05/26 0701 - 05/27 0700 In: 251.1 [I.V.:251.1] Out: 3000 [Urine:2600; Chest Tube:400] Intake/Output this shift:    General appearance: cooperative Back: lacs CDI with sutures Resp: decreased at L base Chest wall: left sided chest wall tenderness Cardio: regular rate and rhythm GI: soft, NT  Lab Results: CBC   Recent Labs  08/26/13 0335 08/27/13 0720  WBC 12.5* 11.0*  HGB 8.2* 8.3*  HCT 24.3* 25.3*  PLT 334 366   BMET  Recent Labs  08/26/13 0335 08/27/13 0720  NA 131* 133*  K 4.9 4.5  CL 96 99  CO2 21 19  GLUCOSE 171* 120*  BUN 34* 23  CREATININE 1.05 0.90  CALCIUM 8.4 8.4   PT/INR No results found for this basename: LABPROT, INR,  in the last 72 hours ABG No results found for this basename: PHART, PCO2, PO2, HCO3,  in the last 72 hours  Studies/Results: Dg Chest Port 1 View  08/27/2013   CLINICAL DATA:  Chest tube.  EXAM: PORTABLE CHEST - 1 VIEW  COMPARISON:  Chest CT 08/24/2013.  Chest x-ray 08/24/2013.  FINDINGS: Left chest tube in stable position . Mediastinum and hilar structures are normal. Stable cardiomegaly with normal pulmonary vascularity. Stable left lower lobe atelectasis and/or infiltrate and left-sided pleural fluid collection consistent with previously identified hemothorax. Multiple left rib fractures are better demonstrated by CT. No pneumothorax.  IMPRESSION: 1. Stable chest tube positioning. 2. Stable left hemothorax. 3. Stable  left lower lobe atelectasis and/or infiltrate. 4. Multiple left rib fractures, better seen by prior chest CT of 08/24/2013.   Electronically Signed   By: Marcello Moores  Register   On: 08/27/2013 07:29    Anti-infectives: Anti-infectives   Start     Dose/Rate Route Frequency Ordered Stop   08/18/13 0645  ceFAZolin (ANCEF) IVPB 1 g/50 mL premix     1 g 100 mL/hr over 30 Minutes Intravenous  Once 08/18/13 3500 08/18/13 9381      Assessment/Plan: Fall  Multiple left rib fxs/hemothorax -- CT in place, TCTS plans VATS to evacuate residual HTX Back lacerations -- Local care, sutures to stay in 14d total ABL anemia -- due to L HTX NSTEMI - CAD, consideration of CABG P   LOS: 9 days    Georganna Skeans, MD, MPH, FACS Trauma: [phone] General Surgery: [phone]  08/27/2013

## 2013-08-27 NOTE — Progress Notes (Signed)
Subjective:   Day of hospitalization: 9  VSS.  Hgb stable at 8.3.  S/p 1 unit of prbcs on 5/21.  He is down 6.4L since admission.  Chest tube output 428ml yesterday.   Cath yesterday revealed: pt has known multivessel disease with occluded LAD, circumflex and RCA.  The proximal diagonal branch now has an 80-90% stenosis in the proximal ramus intermedius as a 60-70% stenosis prior to bifurcation.  Relatively normal LVED.  Plan for possible CABG.    Objective:   Vital signs in last 24 hours: Filed Vitals:   08/27/13 0611 08/27/13 1311 08/27/13 1415 08/27/13 1717  BP: 132/59 130/78  132/61  Pulse: 94 95 104 91  Temp: 99 F (37.2 C) 98.6 F (37 C)  99.8 F (37.7 C)  TempSrc: Oral Oral  Oral  Resp: 20 20  20   Height:      Weight:      SpO2: 98% 100% 87% 96%    Weight: Filed Weights   08/25/13 0600 08/26/13 0348 08/27/13 0300  Weight: 184 lb 8.4 oz (83.7 kg) 184 lb 8.4 oz (83.7 kg) 185 lb 3 oz (84 kg)    Ins/Outs:  Intake/Output Summary (Last 24 hours) at 08/27/13 2029 Last data filed at 08/27/13 1700  Gross per 24 hour  Intake    720 ml  Output   1400 ml  Net   -680 ml    Physical Exam: Constitutional: Vital signs reviewed.  Patient lying in bed with left chest tube in place; NAD.  HEENT: [place]/AT; PERRL, EOMI, conjunctivae normal, no scleral icterus  Cardiovascular: RRR, no MRG Pulmonary/Chest: mildly increased respiratory effort, minimal accessory muscle use, left chest tube in place; without rales, wheezes, or rhonchi Abdominal: Soft. Mildly distended, +BS, NT/ND Neurological: A&O x3, CN II-XII grossly intact; non-focal exam Musculoskeletal: 15cm laceration R upper back at approximately the T3 level.   Abrasions noted left mid thoracic and back region with TTP. No midline spinal tenderness noted.  Extremities: 2+DP b/l, no C/C, b/l LE trace pitting edema Skin: Warm, dry.    Lab Results:  BMP:  Recent Labs Lab 08/26/13 0335 08/27/13 0720  NA 131* 133*  K  4.9 4.5  CL 96 99  CO2 21 19  GLUCOSE 171* 120*  BUN 34* 23  CREATININE 1.05 0.90  CALCIUM 8.4 8.4  MG 2.9* 2.6*   Anion Gap:  15  CBC:  Recent Labs Lab 08/22/13 0425 08/23/13 0315  08/26/13 0335 08/27/13 0720  WBC 9.7 11.3*  < > 12.5* 11.0*  NEUTROABS 8.1* 9.0*  --   --   --   HGB 8.3* 8.2*  < > 8.2* 8.3*  HCT 25.0* 23.5*  < > 24.3* 25.3*  MCV 87.4 85.5  < > 85.0 86.1  PLT 226 246  < > 334 366  < > = values in this interval not displayed.  Coagulation: No results found for this basename: LABPROT, INR,  in the last 168 hours  CBG:            Recent Labs Lab 08/26/13 1229 08/26/13 1650 08/26/13 2147 08/27/13 0846 08/27/13 1307 08/27/13 1741  GLUCAP 123* 115* 114* 118* 165* 166*           HA1C:      No results found for this basename: HGBA1C,  in the last 168 hours  Lipid Panel: No results found for this basename: CHOL, HDL, LDLCALC, TRIG, CHOLHDL, LDLDIRECT,  in the last 168 hours  LFTs: No results  found for this basename: AST, ALT, ALKPHOS, BILITOT, PROT, ALBUMIN,  in the last 168 hours  Pancreatic Enzymes: No results found for this basename: LIPASE, AMYLASE,  in the last 168 hours  Lactic Acid/Procalcitonin: No results found for this basename: LATICACIDVEN, PROCALCITON,  in the last 168 hours  Ammonia: No results found for this basename: AMMONIA,  in the last 168 hours  Cardiac Enzymes:  Recent Labs Lab 08/21/13 0259  TROPONINI <0.30    EKG: EKG Interpretation  Date/Time:  Monday Aug 18 2013 04:59:50 EDT Ventricular Rate:  78 PR Interval:  169 QRS Duration: 107 QT Interval:  394 QTC Calculation: 449 R Axis:   6 Text Interpretation:  Sinus rhythm Anterior infarct, old ED PHYSICIAN INTERPRETATION AVAILABLE IN [place] Confirmed by TEST, Record ([id number]) on 08/20/2013 7:15:39 AM   BNP: No results found for this basename: PROBNP,  in the last 168 hours  D-Dimer: No results found for this basename: DDIMER,  in the last 168  hours  Urinalysis: No results found for this basename: COLORURINE, APPERANCEUR, LABSPEC, PHURINE, GLUCOSEU, HGBUR, BILIRUBINUR, KETONESUR, PROTEINUR, UROBILINOGEN, NITRITE, LEUKOCYTESUR,  in the last 168 hours  Micro Results: Recent Results (from the past 240 hour(s))  MRSA PCR SCREENING     Status: None   Collection Time    08/18/13  4:00 PM      Result Value Ref Range Status   MRSA by PCR NEGATIVE  NEGATIVE Final   Comment:            The GeneXpert MRSA Assay (FDA     approved for NASAL specimens     only), is one component of a     comprehensive MRSA colonization     surveillance program. It is not     intended to diagnose MRSA     infection nor to guide or     monitor treatment for     MRSA infections.    Blood Culture: No results found for this basename: sdes,  specrequest,  cult,  reptstatus    Studies/Results: Dg Chest Port 1 View  08/27/2013   CLINICAL DATA:  Chest tube.  EXAM: PORTABLE CHEST - 1 VIEW  COMPARISON:  Chest CT 08/24/2013.  Chest x-ray 08/24/2013.  FINDINGS: Left chest tube in stable position . Mediastinum and hilar structures are normal. Stable cardiomegaly with normal pulmonary vascularity. Stable left lower lobe atelectasis and/or infiltrate and left-sided pleural fluid collection consistent with previously identified hemothorax. Multiple left rib fractures are better demonstrated by CT. No pneumothorax.  IMPRESSION: 1. Stable chest tube positioning. 2. Stable left hemothorax. 3. Stable left lower lobe atelectasis and/or infiltrate. 4. Multiple left rib fractures, better seen by prior chest CT of 08/24/2013.   Electronically Signed   By: Marcello Moores  Register   On: 08/27/2013 07:29    Medications:  Scheduled Meds: . Derrill Memo ON 08/28/2013] aminocaproic acid (AMICAR) for OHS   Intravenous To OR  . aspirin EC  81 mg Oral Daily  . atorvastatin  40 mg Oral q1800  . bacitracin   Topical BID  . bisacodyl  5 mg Oral Once  . [START ON 08/28/2013] cefUROXime (ZINACEF)   IV  1.5 g Intravenous To OR  . [START ON 08/28/2013] cefUROXime (ZINACEF)  IV  750 mg Intravenous To OR  . Chlorhexidine Gluconate Cloth  6 each Topical Once   And  . Chlorhexidine Gluconate Cloth  6 each Topical Once  . [START ON 08/28/2013] dexmedetomidine  0.1-0.7 mcg/kg/hr Intravenous To OR  . [  START ON 08/28/2013] DOPamine  2-20 mcg/kg/min Intravenous To OR  . [START ON 08/28/2013] epinephrine  0.5-20 mcg/min Intravenous To OR  . feeding supplement (GLUCERNA SHAKE)  237 mL Oral BID BM  . [START ON 08/28/2013] heparin-papaverine-plasmalyte irrigation   Irrigation To OR  . [START ON 08/28/2013] heparin 30,000 units/NS 1000 mL solution for CELLSAVER   Other To OR  . insulin aspart  0-9 Units Subcutaneous TID WC  . insulin glargine  10 Units Subcutaneous QHS  . [START ON 08/28/2013] insulin (NOVOLIN-R) infusion   Intravenous To OR  . [START ON 08/28/2013] magnesium sulfate  40 mEq Other To OR  . [START ON 08/28/2013] metoprolol tartrate  12.5 mg Oral Once  . metoprolol tartrate  25 mg Oral BID  . mometasone-formoterol  2 puff Inhalation BID  . [START ON 08/28/2013] nitroGLYCERIN  2-200 mcg/min Intravenous To OR  . pantoprazole  40 mg Oral Daily  . [START ON 08/28/2013] phenylephrine (NEO-SYNEPHRINE) Adult infusion  30-200 mcg/min Intravenous To OR  . polyethylene glycol  17 g Oral Daily  . [START ON 08/28/2013] potassium chloride  80 mEq Other To OR  . sodium chloride  3 mL Intravenous Q12H  . sodium chloride  3 mL Intravenous Q12H  . sodium phosphate  1 enema Rectal Once  . traMADol  100 mg Oral 4 times per day  . [START ON 08/28/2013] vancomycin 1000 mg in NS (1000 ml) irrigation for Dr. Roxy Manns case   Irrigation To OR  . [START ON 08/28/2013] vancomycin  1,500 mg Intravenous To OR   Continuous Infusions:   PRN Meds: sodium chloride, acetaminophen, calcium carbonate, ondansetron (ZOFRAN) IV, oxyCODONE, sodium chloride, temazepam  Antibiotics: Antibiotics Given (last 72 hours)   None       Day of Hospitalization: 9  Consults: Treatment Team:  Trauma Md, MD Rounding [facility], MD Rexene Alberts, MD  Assessment/Plan:   Principal Problem:   Syncope Active Problems:   HYPERTENSION   Coronary artery disease   Ischemic cardiomyopathy   Diabetes mellitus   Hyperkalemia   Chronic systolic heart failure   Multiple fractures of ribs of left side   Laceration of skin of scalp   Hematoma of occipital surface of head   Chronic sinusitis   Laceration of skin   NSTEMI (non-ST elevated myocardial infarction)   Pleural effusion   Urine retention   Syncope and collapse   Pleural effusion, left   Hemothorax on left   A-fib  Multiple segmental rib fractures with clotted left hemothorax Pain improving.  5/24 CT chest showed moderate amt of residual clotted blood in posterior hemithorax with LLL atx.  CVTS (Dr. Roxy Manns) consulted and is recommending VATS but should undergo diagnostic cath prior to surgery.  Cath showed 80-90% occlusion.  -CABG and VATS tomorrow per CVTS -pain management -pulmonary toilet/IS  Acute atrial fibrillation with RVR  Stable. HR controlled.  Has been in a-fib with HR up to 140s; was asymptomatic.  Cardiology consulted.  TSH wnl.  -metoprolol for rate control to 25mg  bid per cards -no heparin gtt per cards  Syncope  Stable-no further episodes this admission. Trops trended down.  EP consulted and placed implantable loop recorder.  5/19 echo: LVEF 55-60% with LA dilation but unchanged from 2013 echo.   -appreciate EP recs -monitor  NSTEMI Stable.  Trops trended down.  Lipid panel OK.  Known severe multivessel CAD as previously described with occluded LAD, circumflex and RCA. The proximal diagonal branch now has an 80-90% stenosis  in the proximal ramus intermedius as a 60-70% stenosis prior to bifurcation. Relatively normal LVEDP. -CABG and VATS tomorrow -atorvastatin to 40mg  qd  Acute on chronic normocytic anemia Pt with acute blood loss  d/t multiple rib fractures.  Anemia panel revealed normal retic, ferritin wnl, iron low at 13, and TIBC low at 205.  Folate wnl.  B12 low at 287, will get MMA.  Pt never had colonoscopy and will need as outpatient.   -MMA pending -FOBT pending -pt needs GI follow-up as outpatient  Upper back skin laceration 15 cm laceration at risk for skin necrosis.  Laceration repaired by trama service. -monitor -bacitracin applied bid  Left suboccipital scalp hematoma and laceration -monitor  AKI Resolved.  Baseline is 1.1-1.2.  He was noted to have mild elevated Cr at 1.7 on admission. CK trended down.  -hold lasix -avoid nephrotoxic meds -BMP in am  Leukocytosis Likely stress response.  h/o hypertension  BP stable.  On home lasix, imdur and metoprolol.  -metoprolol 25mg  bid, cardizem 60mg  qid  DMII Lab Results  Component Value Date   HGBA1C 7.4* 08/18/2013    Recent Labs  08/27/13 0846 08/27/13 1307 08/27/13 1741  GLUCAP 118* 165* 166*  -hold home glipizide -Lantus 10 units QHS -SSI  Chronic sinusitis This is an incidental finding on CT head.   -follow-up as outpatient    Deconditioning PT recommending SNF but pt not amenable-would like HH.    Mild hyponatremia Asymptomatic.  -monitor BMP  Hyperkalemia Resolved.  No EKG changes.  Given kayexalate x 2 without any BM.  -BMP in AM  VTE SCDs  Dispo: Disposition is deferred at this time, awaiting improvement of current medical problems. Anticipated discharge in approximately 2-3 day(s).  Pt is not interested in going to inpatient rehab or SNF.  Would like to go [facility] with [facility].    The patient does have a current PCP Charolotte Eke Powers) and does need an [facility] hospital follow-up appointment after discharge.   Signed: Jones Bales, MD 08/27/2013, 8:29 PM

## 2013-08-27 NOTE — Progress Notes (Addendum)
[address]       ,[place] [address]             [phone]     CARDIOTHORACIC SURGERY PROGRESS NOTE  1 Day Post-Op  S/P Procedure(s) (LRB): LEFT HEART CATHETERIZATION WITH CORONARY ANGIOGRAM (N/A)  Subjective: Feels okay.  Some dyspnea with activity.  Sore in chest.  Objective: Vital signs in last 24 hours: Temp:  [98.1 F (36.7 C)-99 F (37.2 C)] 98.6 F (37 C) (05/27 1311) Pulse Rate:  [94-111] 104 (05/27 1415) Cardiac Rhythm:  [-] Atrial fibrillation (05/26 2000) Resp:  [15-20] 20 (05/27 1311) BP: (130-167)/(59-106) 130/78 mmHg (05/27 1311) SpO2:  [87 %-100 %] 87 % (05/27 1415) Weight:  [84 kg (185 lb 3 oz)] 84 kg (185 lb 3 oz) (05/27 0300)  Physical Exam:  Rhythm:   Afib  Breath sounds: Diminished on left side  Heart sounds:  irreg  Incisions:  n/a  Abdomen:  soft  Extremities:  warm   Intake/Output from previous day: 05/26 0701 - 05/27 0700 In: 251.1 [I.V.:251.1] Out: 3000 [Urine:2600; Chest Tube:400] Intake/Output this shift: Total I/O In: 240 [P.O.:240] Out: -   Lab Results:  Recent Labs  08/26/13 0335 08/27/13 0720  WBC 12.5* 11.0*  HGB 8.2* 8.3*  HCT 24.3* 25.3*  PLT 334 366   BMET:  Recent Labs  08/26/13 0335 08/27/13 0720  NA 131* 133*  K 4.9 4.5  CL 96 99  CO2 21 19  GLUCOSE 171* 120*  BUN 34* 23  CREATININE 1.05 0.90  CALCIUM 8.4 8.4    CBG (last 3)   Recent Labs  08/26/13 2147 08/27/13 0846 08/27/13 1307  GLUCAP 114* 118* 165*   PT/INR:  No results found for this basename: LABPROT, INR,  in the last 72 hours  CXR:  PORTABLE CHEST - 1 VIEW  COMPARISON: Chest CT 08/24/2013. Chest x-ray 08/24/2013.  FINDINGS:  Left chest tube in stable position . Mediastinum and hilar  structures are normal. Stable cardiomegaly with normal pulmonary  vascularity. Stable left lower lobe atelectasis and/or infiltrate  and left-sided pleural fluid collection consistent with previously  identified hemothorax.  Multiple left rib fractures are better  demonstrated by CT. No pneumothorax.  IMPRESSION:  1. Stable chest tube positioning.  2. Stable left hemothorax.  3. Stable left lower lobe atelectasis and/or infiltrate.  4. Multiple left rib fractures, better seen by prior chest CT of  08/24/2013.  Electronically Signed  By: Marcello Moores Register  On: 08/27/2013 07:29   CARDIAC CATHETERIZATION REPORT  NAME: JAVION MASTALSKI MRN: [id number]  DOB: Dec 25, 1945 ADMIT DATE: 08/18/2013  Procedure Date: 08/26/2013  INTERVENTIONAL CARDIOLOGIST: [name], M.D., MS  PRIMARY CARE PROVIDER: Saralyn Pilar  PRIMARY CARDIOLOGIST: Kirk Ruths, MD  PATIENT: Gerald Rosario is a 68 y.o. 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 He has a known occluded LAD after a large septal perforator and diagonal branch, occluded circumflex and severely diseased small RCA.  He was admitted to [facility] for an episode of syncope with falling down the stairs with significant trauma with rib fractures now suffering a hemothorax. He did have elevated positive troponins, but denied any anginal symptoms. His EF is preserved with no regional wall motion abnormalities. He is status post chest tube placement. His also had a loop recorder placed to evaluate  for possible arrhythmogenic syncope. Evaluate for possible ischemia mediated syncope, he is referred for invasive evaluation and diagnostic cardiac catheterization. Since he is currently still with chest tube in place and recommended no IV heparin, we will have to use a femoral approach to avoid getting heparin with radial access.  PRE-OPERATIVE DIAGNOSIS:  Non-STEMI  Syncope  Known severe CAD PROCEDURES PERFORMED:  left Heart Catheterization with Native Coronary Angiography via Right Common Femoral Artery  PROCEDURE: The patient was brought to the  [facility]nd [facility] in the fasting state and prepped and draped in the usual sterile fashion for Right groin access. Sterile technique was used including antiseptics, cap, gloves, gown, hand hygiene, mask and sheet. Skin prep: Chlorhexidine.  Consent: Risks of procedure as well as the alternatives and risks of each were explained to the (patient/caregiver). Consent for procedure obtained.  Time Out: Verified patient identification, verified procedure, site/side was marked, verified correct patient position, special equipment/implants available, medications/allergies/relevent history reviewed, required imaging and test results available. Performed.  Access:  Right Common Femoral Artery: 5 Fr Sheath - fluoroscopically guided modified Seldinger Technique  Left Heart Catheterization: 5 Fr Catheters advanced or exchanged over a J-wire; JL4 catheter advanced first.  Left Coronary Artery Cineangiography: JL4 Catheter  Right Coronary Artery Cineangiography: JR 4 Catheter  LV Hemodynamics: JL4 Sheath removed in the holding area with manual pressure for hemostasis.  MEDICATIONS:  ANESTHESIA: Local Lidocaine 12 ml  SEDATION: 1 mg IV Versed, 75 mcg IV fentanyl ;  Omnipaque contrast 85 mL  EBL: < 10 ml  FINDINGS:  Hemodynamics:  Central Aortic Pressure / Mean: 122/63/88 mmHg  Left Ventricular Pressure / LVEDP: 121/7/11 Left Ventriculography: Deferred  Coronary Anatomy:  Left Main: Calcified large-caliber vessel that trifurcates into the LAD, Circumflex, and Ramus Intermedius; no significant disease in the LM. LAD: Moderate caliber vessel with mild diffuse disease proximally. There is a very large septal perforator trunk several branches providing collaterals to the RPDA. As after the septal perforator there is a moderate caliber diagonal branch with proximal irregular 80-90% stenosis in this main branch. The LAD after the diagonal is 100% occluded as previously described with  collaterals coming from septal to septal and diagonal to diagonal with retrograde flow up the distal LAD.  Left Circumflex: Again is a moderate large-caliber vessel 100% occluded in the mid AV groove after atrial branch. The distal circumflex "ghosts in" with collaterals from the Ramus  Ramus intermedius: Moderate to large caliber vessel that bifurcates proximally into 2 major branches. The proximal portion of this vessel has an eccentric 60-70% stenosis prior to bifurcation. The 2 major branches are relatively free of disease would be more lateral OM side branch having 2 branches.  RCA: Lead previously a moderate caliber vessel now very small and diameter with proximal 90% stenosis followed by a string-like vessel that is occluded in the mid vessel at an RV marginal branch. The RV marginal branch provides collateral flow to what looks like the distal LAD with right to right collaterals also filling the RPDA.  The RPDA and posterior lateral branches provided flow by both right to left and left to right collaterals. After reviewing the initial angiography, no obvious culprit lesion can be identified but to potential new lesions are the significant lesion in the diagonal branch as well as in the ramus medius branch..  MEDICATIONS:  Anesthesia: Local Lidocaine 12 ml Sedation: 1 mg IV Versed, 75 mcg IV fentanyl ;  Omnipaque Contrast: 55 ml PATIENT  DISPOSITION:  The patient was transferred to the PACU holding area in a hemodynamicaly stable, chest pain free condition.  The patient tolerated the procedure well, and there were no complications. EBL: < 10 ml  The patient was stable before, during, and after the procedure. POST-OPERATIVE DIAGNOSIS:  Known severe multivessel CAD as previously described with occluded LAD, circumflex and RCA. The proximal diagonal branch now has an 80-90% stenosis in the proximal ramus intermedius as a 60-70% stenosis prior to bifurcation.  Relatively normal LVEDP PLAN OF CARE:   Patient will return 2 days nursing unit for post catheterization care. He reviewed the images with colleagues and discuss potential treatment options including PCI of both the ramus and the diagonal versus just a diagonal. Also what could be considered would be bypass surgery as he has viability in the LAD and circumflex as well as RCA distribution based on cardiac MRI and echocardiographic findings.  Will defer further care to the primary service. [name], M.D., M.S.  [facility]  [address] [place], [place] [address]  [phone]  08/26/2013  4:42 PM   Revision History...      Date/Time User Action    08/26/2013 7:09 PM [name], MD Addend    08/26/2013 5:01 PM [name], MD Sign   View Details Report        Assessment/Plan: S/P Procedure(s) (LRB): LEFT HEART CATHETERIZATION WITH CORONARY ANGIOGRAM (N/A)  Patient remains clinically stable.  Cardiac cath films reviewed.  Patient has severe three-vessel coronary artery disease with chronic occlusion of the left anterior descending coronary artery, the left circumflex, and the right coronary artery. This was seen on catheterization the past, but yesterday's cath demonstrates some additional progression of disease including high-grade stenosis in the patent diagonal branch and ramus intermedius branch.  Left ventricular function has increased substantially over the past few years, which implies the presence of significant viable myocardium within the distribution of the chronically occluded vessels.  Based upon the cath films it is difficult to know what kind of target vessels will be present within the distribution of the left anterior descending coronary artery and the right coronary artery territories, but despite this I feel the patient would likely benefit from coronary artery bypass grafting in terms of decreased risk of future cardiac events and improve long-term survival.   Options at this point include proceeding with coronary artery bypass grafting and simultaneous evacuation of the left hemothorax versus proceeding with left VATS for evacuation of hemothorax with delayed coronary artery bypass grafting in the future, versus non-surgical treatment for both.  Although the risks of surgery may be somewhat increased under the circumstances, I favor proceeding with simultaneous coronary artery bypass grafting and is evacuation of left hemothorax.  I have reviewed the indications, risks, and potential benefits of coronary artery bypass grafting and evacuation of left hemothorax with the patient this afternoon and with his daughter via telephone.  Alternative treatment strategies have been discussed.  The patient understands and accepts all potential associated risks of surgery including but not limited to risk of death, stroke or other neurologic complication, myocardial infarction, congestive heart failure, respiratory failure, renal failure, bleeding requiring blood transfusion and/or reexploration, aortic dissection or other major vascular complication, arrhythmia, heart block or bradycardia requiring permanent pacemaker, pneumonia, pleural effusion, wound infection, pulmonary embolus or other thromboembolic complication, chronic pain or other delayed complications related to median sternotomy, or the late recurrence of symptomatic ischemic heart disease  and/or congestive heart failure.  We also discussed the potential benefits of clipping of left atrial appendage at the time of surgery given the development of new onset atrial fibrillation.  The importance of long term risk modification have been emphasized.  All questions answered.  We plan to proceed with surgery tomorrow.  I spent in excess of 45 minutes during the conduct of this hospital encounter and >50% of this time involved direct face-to-face encounter with the patient for counseling and/or coordination of their  care.  Rexene Alberts 08/27/2013 5:15 PM

## 2013-08-28 ENCOUNTER — Inpatient Hospital Stay (HOSPITAL_COMMUNITY): Payer: Medicare Other | Admitting: Anesthesiology

## 2013-08-28 ENCOUNTER — Encounter (HOSPITAL_COMMUNITY): Payer: Self-pay | Admitting: Certified Registered Nurse Anesthetist

## 2013-08-28 ENCOUNTER — Inpatient Hospital Stay (HOSPITAL_COMMUNITY): Payer: Medicare Other

## 2013-08-28 ENCOUNTER — Other Ambulatory Visit: Payer: Self-pay

## 2013-08-28 ENCOUNTER — Encounter (HOSPITAL_COMMUNITY)
Admission: EM | Disposition: A | Discharge status: Skilled Nursing Facility | Payer: Medicare Other | Source: Home / Self Care | Attending: Thoracic Surgery (Cardiothoracic Vascular Surgery)

## 2013-08-28 DIAGNOSIS — I2510 Atherosclerotic heart disease of native coronary artery without angina pectoris: Secondary | ICD-10-CM

## 2013-08-28 DIAGNOSIS — M7981 Nontraumatic hematoma of soft tissue: Secondary | ICD-10-CM

## 2013-08-28 DIAGNOSIS — Z951 Presence of aortocoronary bypass graft: Secondary | ICD-10-CM

## 2013-08-28 HISTORY — PX: CLIPPING OF ATRIAL APPENDAGE: SHX5773

## 2013-08-28 HISTORY — PX: HEMATOMA EVACUATION: SHX5118

## 2013-08-28 HISTORY — PX: INTRAOPERATIVE TRANSESOPHAGEAL ECHOCARDIOGRAM: SHX5062

## 2013-08-28 HISTORY — PX: CORONARY ARTERY BYPASS GRAFT: SHX141

## 2013-08-28 LAB — POCT I-STAT 3, ART BLOOD GAS (G3+)
Acid-base deficit: 2 mmol/L (ref 0.0–2.0)
Acid-base deficit: 4 mmol/L — ABNORMAL HIGH (ref 0.0–2.0)
Acid-base deficit: 5 mmol/L — ABNORMAL HIGH (ref 0.0–2.0)
Acid-base deficit: 6 mmol/L — ABNORMAL HIGH (ref 0.0–2.0)
Bicarbonate: 19.9 mEq/L — ABNORMAL LOW (ref 20.0–24.0)
Bicarbonate: 21.4 mEq/L (ref 20.0–24.0)
Bicarbonate: 21.8 mEq/L (ref 20.0–24.0)
Bicarbonate: 22.4 mEq/L (ref 20.0–24.0)
O2 Saturation: 100 %
O2 Saturation: 100 %
O2 Saturation: 94 %
O2 Saturation: 94 %
Patient temperature: 35.8
Patient temperature: 37.1
TCO2: 21 mmol/L (ref 0–100)
TCO2: 22 mmol/L (ref 0–100)
TCO2: 23 mmol/L (ref 0–100)
TCO2: 24 mmol/L (ref 0–100)
pCO2 arterial: 32.5 mmHg — ABNORMAL LOW (ref 35.0–45.0)
pCO2 arterial: 33.5 mmHg — ABNORMAL LOW (ref 35.0–45.0)
pCO2 arterial: 44.8 mmHg (ref 35.0–45.0)
pCO2 arterial: 52.2 mmHg — ABNORMAL HIGH (ref 35.0–45.0)
pH, Arterial: 7.229 — ABNORMAL LOW (ref 7.350–7.450)
pH, Arterial: 7.306 — ABNORMAL LOW (ref 7.350–7.450)
pH, Arterial: 7.377 (ref 7.350–7.450)
pH, Arterial: 7.427 (ref 7.350–7.450)
pO2, Arterial: 312 mmHg — ABNORMAL HIGH (ref 80.0–100.0)
pO2, Arterial: 342 mmHg — ABNORMAL HIGH (ref 80.0–100.0)
pO2, Arterial: 68 mmHg — ABNORMAL LOW (ref 80.0–100.0)
pO2, Arterial: 69 mmHg — ABNORMAL LOW (ref 80.0–100.0)

## 2013-08-28 LAB — POCT I-STAT 4, (NA,K, GLUC, HGB,HCT)
Glucose, Bld: 162 mg/dL — ABNORMAL HIGH (ref 70–99)
Glucose, Bld: 155 mg/dL — ABNORMAL HIGH (ref 70–99)
Glucose, Bld: 164 mg/dL — ABNORMAL HIGH (ref 70–99)
Glucose, Bld: 140 mg/dL — ABNORMAL HIGH (ref 70–99)
Glucose, Bld: 156 mg/dL — ABNORMAL HIGH (ref 70–99)
Glucose, Bld: 157 mg/dL — ABNORMAL HIGH (ref 70–99)
Glucose, Bld: 97 mg/dL (ref 70–99)
HCT: 27 % — ABNORMAL LOW (ref 39.0–52.0)
HCT: 20 % — ABNORMAL LOW (ref 39.0–52.0)
HCT: 23 % — ABNORMAL LOW (ref 39.0–52.0)
HCT: 23 % — ABNORMAL LOW (ref 39.0–52.0)
HCT: 24 % — ABNORMAL LOW (ref 39.0–52.0)
HCT: 20 % — ABNORMAL LOW (ref 39.0–52.0)
HCT: 27 % — ABNORMAL LOW (ref 39.0–52.0)
Hemoglobin: 9.2 g/dL — ABNORMAL LOW (ref 13.0–17.0)
Hemoglobin: 6.8 g/dL — CL (ref 13.0–17.0)
Hemoglobin: 7.8 g/dL — ABNORMAL LOW (ref 13.0–17.0)
Hemoglobin: 7.8 g/dL — ABNORMAL LOW (ref 13.0–17.0)
Hemoglobin: 8.2 g/dL — ABNORMAL LOW (ref 13.0–17.0)
Hemoglobin: 6.8 g/dL — CL (ref 13.0–17.0)
Hemoglobin: 9.2 g/dL — ABNORMAL LOW (ref 13.0–17.0)
Potassium: 4.4 mEq/L (ref 3.7–5.3)
Potassium: 4.2 mEq/L (ref 3.7–5.3)
Potassium: 4.7 mEq/L (ref 3.7–5.3)
Potassium: 4.6 mEq/L (ref 3.7–5.3)
Potassium: 4.7 mEq/L (ref 3.7–5.3)
Potassium: 4.4 mEq/L (ref 3.7–5.3)
Potassium: 4.2 mEq/L (ref 3.7–5.3)
Sodium: 133 mEq/L — ABNORMAL LOW (ref 137–147)
Sodium: 134 mEq/L — ABNORMAL LOW (ref 137–147)
Sodium: 134 mEq/L — ABNORMAL LOW (ref 137–147)
Sodium: 133 mEq/L — ABNORMAL LOW (ref 137–147)
Sodium: 133 mEq/L — ABNORMAL LOW (ref 137–147)
Sodium: 135 mEq/L — ABNORMAL LOW (ref 137–147)
Sodium: 135 mEq/L — ABNORMAL LOW (ref 137–147)

## 2013-08-28 LAB — HEMOGLOBIN AND HEMATOCRIT, BLOOD
HCT: 21.2 % — ABNORMAL LOW (ref 39.0–52.0)
Hemoglobin: 7.4 g/dL — ABNORMAL LOW (ref 13.0–17.0)

## 2013-08-28 LAB — BASIC METABOLIC PANEL
BUN: 25 mg/dL — ABNORMAL HIGH (ref 6–23)
CO2: 21 mEq/L (ref 19–32)
Calcium: 8.3 mg/dL — ABNORMAL LOW (ref 8.4–10.5)
Chloride: 97 mEq/L (ref 96–112)
Creatinine, Ser: 0.95 mg/dL (ref 0.50–1.35)
GFR calc Af Amer: >90 mL/min (ref >90)
GFR calc non Af Amer: 84 mL/min — ABNORMAL LOW (ref >90)
Glucose, Bld: 156 mg/dL — ABNORMAL HIGH (ref 70–99)
Potassium: 4.5 mEq/L (ref 3.7–5.3)
Sodium: 131 mEq/L — ABNORMAL LOW (ref 137–147)

## 2013-08-28 LAB — POCT I-STAT, CHEM 8
BUN: 22 mg/dL (ref 6–23)
Calcium, Ion: 1.15 mmol/L (ref 1.13–1.30)
Chloride: 102 mEq/L (ref 96–112)
Creatinine, Ser: 0.9 mg/dL (ref 0.50–1.35)
Glucose, Bld: 108 mg/dL — ABNORMAL HIGH (ref 70–99)
HCT: 24 % — ABNORMAL LOW (ref 39.0–52.0)
Hemoglobin: 8.2 g/dL — ABNORMAL LOW (ref 13.0–17.0)
Potassium: 4.4 mEq/L (ref 3.7–5.3)
Sodium: 135 mEq/L — ABNORMAL LOW (ref 137–147)
TCO2: 21 mmol/L (ref 0–100)

## 2013-08-28 LAB — CBC
HCT: 23.1 % — ABNORMAL LOW (ref 39.0–52.0)
HCT: 26.2 % — ABNORMAL LOW (ref 39.0–52.0)
HCT: 24.5 % — ABNORMAL LOW (ref 39.0–52.0)
Hemoglobin: 7.9 g/dL — ABNORMAL LOW (ref 13.0–17.0)
Hemoglobin: 9 g/dL — ABNORMAL LOW (ref 13.0–17.0)
Hemoglobin: 8.5 g/dL — ABNORMAL LOW (ref 13.0–17.0)
MCH: 28.9 pg (ref 26.0–34.0)
MCH: 29 pg (ref 26.0–34.0)
MCH: 28.8 pg (ref 26.0–34.0)
MCHC: 34.2 g/dL (ref 30.0–36.0)
MCHC: 34.4 g/dL (ref 30.0–36.0)
MCHC: 34.7 g/dL (ref 30.0–36.0)
MCV: 84.6 fL (ref 78.0–100.0)
MCV: 84.5 fL (ref 78.0–100.0)
MCV: 83.1 fL (ref 78.0–100.0)
Platelets: 359 10*3/uL (ref 150–400)
Platelets: 275 10*3/uL (ref 150–400)
Platelets: 250 10*3/uL (ref 150–400)
RBC: 2.73 MIL/uL — ABNORMAL LOW (ref 4.22–5.81)
RBC: 3.1 MIL/uL — ABNORMAL LOW (ref 4.22–5.81)
RBC: 2.95 MIL/uL — ABNORMAL LOW (ref 4.22–5.81)
RDW: 14.7 % (ref 11.5–15.5)
RDW: 14.5 % (ref 11.5–15.5)
RDW: 14.8 % (ref 11.5–15.5)
WBC: 11.6 10*3/uL — ABNORMAL HIGH (ref 4.0–10.5)
WBC: 22.3 10*3/uL — ABNORMAL HIGH (ref 4.0–10.5)
WBC: 18 10*3/uL — ABNORMAL HIGH (ref 4.0–10.5)

## 2013-08-28 LAB — SURGICAL PCR SCREEN
MRSA, PCR: NEGATIVE
Staphylococcus aureus: NEGATIVE

## 2013-08-28 LAB — PROTIME-INR
INR: 1.37 (ref 0.00–1.49)
INR: 1.99 — ABNORMAL HIGH (ref 0.00–1.49)
Prothrombin Time: 16.5 seconds — ABNORMAL HIGH (ref 11.6–15.2)
Prothrombin Time: 22 seconds — ABNORMAL HIGH (ref 11.6–15.2)

## 2013-08-28 LAB — METHYLMALONIC ACID, SERUM: Methylmalonic Acid, Quantitative: 143 nmol/L (ref 87–318)

## 2013-08-28 LAB — GLUCOSE, CAPILLARY
Glucose-Capillary: 145 mg/dL — ABNORMAL HIGH (ref 70–99)
Glucose-Capillary: 111 mg/dL — ABNORMAL HIGH (ref 70–99)

## 2013-08-28 LAB — PLATELET COUNT: Platelets: 215 10*3/uL (ref 150–400)

## 2013-08-28 LAB — APTT
aPTT: 32 seconds (ref 24–37)
aPTT: 38 seconds — ABNORMAL HIGH (ref 24–37)

## 2013-08-28 LAB — HEMOGLOBIN A1C
Hgb A1c MFr Bld: 6.7 % — ABNORMAL HIGH (ref <5.7)
Mean Plasma Glucose: 146 mg/dL — ABNORMAL HIGH (ref <117)

## 2013-08-28 LAB — PREPARE RBC (CROSSMATCH)

## 2013-08-28 SURGERY — CORONARY ARTERY BYPASS GRAFTING (CABG)
Anesthesia: General | Site: Chest

## 2013-08-28 MED ORDER — NITROGLYCERIN IN D5W 200-5 MCG/ML-% IV SOLN: 0.0000 ug/min | INTRAVENOUS | Status: DC

## 2013-08-28 MED ORDER — SODIUM CHLORIDE 0.9 % IJ SOLN
3.0000 mL | Freq: Two times a day (BID) | INTRAMUSCULAR | Status: DC
  Administered 2013-08-29 – 2013-08-30 (×3): 3 mL via INTRAVENOUS
  Administered 2013-08-30: 11:00:00 via INTRAVENOUS
  Administered 2013-08-31: 3 mL via INTRAVENOUS
  Administered 2013-08-31: 10:00:00 via INTRAVENOUS
  Administered 2013-09-01 – 2013-09-07 (×4): 3 mL via INTRAVENOUS

## 2013-08-28 MED ORDER — PHENYLEPHRINE HCL 10 MG/ML IJ SOLN
0.0000 ug/min | INTRAVENOUS | Status: DC
  Filled 2013-08-28: qty 2

## 2013-08-28 MED ORDER — PROPOFOL 10 MG/ML IV BOLUS
INTRAVENOUS | Status: DC | PRN
  Administered 2013-08-28: 20 mg via INTRAVENOUS
  Administered 2013-08-28: 80 mg via INTRAVENOUS

## 2013-08-28 MED ORDER — BISACODYL 10 MG RE SUPP
10.0000 mg | Freq: Every day | RECTAL | Status: DC
Start: 2013-08-29 — End: 2013-09-08

## 2013-08-28 MED ORDER — MORPHINE SULFATE 2 MG/ML IJ SOLN: 1.0000 mg | INTRAMUSCULAR | Status: AC | PRN

## 2013-08-28 MED ORDER — ACETAMINOPHEN 500 MG PO TABS
1000.0000 mg | ORAL_TABLET | Freq: Four times a day (QID) | ORAL | Status: AC
  Administered 2013-08-29 – 2013-09-02 (×16): 1000 mg via ORAL
  Filled 2013-08-28 (×20): qty 2

## 2013-08-28 MED ORDER — MORPHINE SULFATE 2 MG/ML IJ SOLN
2.0000 mg | INTRAMUSCULAR | Status: DC | PRN
  Administered 2013-08-29: 2 mg via INTRAVENOUS
  Administered 2013-08-29 (×2): 4 mg via INTRAVENOUS
  Administered 2013-08-29: 2 mg via INTRAVENOUS
  Administered 2013-08-29: 4 mg via INTRAVENOUS
  Filled 2013-08-28: qty 1
  Filled 2013-08-28 (×4): qty 2
  Filled 2013-08-28: qty 1

## 2013-08-28 MED ORDER — FENTANYL CITRATE 0.05 MG/ML IJ SOLN
INTRAMUSCULAR | Status: AC
  Filled 2013-08-28: qty 5

## 2013-08-28 MED ORDER — LACTATED RINGERS IV SOLN
INTRAVENOUS | Status: DC | PRN
  Administered 2013-08-28: 12:00:00 via INTRAVENOUS

## 2013-08-28 MED ORDER — PANTOPRAZOLE SODIUM 40 MG PO TBEC: 40.0000 mg | DELAYED_RELEASE_TABLET | Freq: Every day | ORAL | Status: DC

## 2013-08-28 MED ORDER — DOPAMINE-DEXTROSE 3.2-5 MG/ML-% IV SOLN
0.0000 ug/kg/min | INTRAVENOUS | Status: DC
  Administered 2013-08-28: 3 ug/kg/min via INTRAVENOUS

## 2013-08-28 MED ORDER — PROPOFOL 10 MG/ML IV BOLUS
INTRAVENOUS | Status: AC
  Filled 2013-08-28: qty 20

## 2013-08-28 MED ORDER — PHENYLEPHRINE HCL 10 MG/ML IJ SOLN
10.0000 mg | INTRAVENOUS | Status: DC | PRN
  Administered 2013-08-28: 15 ug/min via INTRAVENOUS

## 2013-08-28 MED ORDER — ALBUMIN HUMAN 5 % IV SOLN
INTRAVENOUS | Status: DC | PRN
  Administered 2013-08-28 (×2): via INTRAVENOUS

## 2013-08-28 MED ORDER — MAGNESIUM SULFATE 4000MG/100ML IJ SOLN
4.0000 g | Freq: Once | INTRAMUSCULAR | Status: AC
  Administered 2013-08-28: 4 g via INTRAVENOUS
  Filled 2013-08-28: qty 100

## 2013-08-28 MED ORDER — ONDANSETRON HCL 4 MG/2ML IJ SOLN
4.0000 mg | Freq: Four times a day (QID) | INTRAMUSCULAR | Status: DC | PRN
  Administered 2013-08-29 – 2013-09-08 (×8): 4 mg via INTRAVENOUS
  Filled 2013-08-28 (×8): qty 2

## 2013-08-28 MED ORDER — CHLORHEXIDINE GLUCONATE 4 % EX LIQD
CUTANEOUS | Status: AC
  Administered 2013-08-28: 07:00:00
  Filled 2013-08-28: qty 15

## 2013-08-28 MED ORDER — PHENYLEPHRINE HCL 10 MG/ML IJ SOLN
INTRAMUSCULAR | Status: AC
  Filled 2013-08-28: qty 1

## 2013-08-28 MED ORDER — MIDAZOLAM HCL 10 MG/2ML IJ SOLN
INTRAMUSCULAR | Status: AC
  Filled 2013-08-28: qty 2

## 2013-08-28 MED ORDER — PROTAMINE SULFATE 10 MG/ML IV SOLN
INTRAVENOUS | Status: DC | PRN
  Administered 2013-08-28: 220 mg via INTRAVENOUS

## 2013-08-28 MED ORDER — PHENYLEPHRINE HCL 10 MG/ML IJ SOLN
30.0000 ug/min | INTRAVENOUS | Status: DC
  Filled 2013-08-28: qty 2

## 2013-08-28 MED ORDER — SODIUM CHLORIDE 0.9 % IR SOLN
Status: DC | PRN
  Administered 2013-08-28: 2000 mL

## 2013-08-28 MED ORDER — ASPIRIN 81 MG PO CHEW
324.0000 mg | CHEWABLE_TABLET | Freq: Every day | ORAL | Status: DC
Start: 2013-08-29 — End: 2013-08-29

## 2013-08-28 MED ORDER — ASPIRIN EC 325 MG PO TBEC
325.0000 mg | DELAYED_RELEASE_TABLET | Freq: Every day | ORAL | Status: DC
  Administered 2013-08-29 – 2013-08-31 (×3): 325 mg via ORAL
  Filled 2013-08-28 (×4): qty 1

## 2013-08-28 MED ORDER — ESMOLOL HCL 10 MG/ML IV SOLN
INTRAVENOUS | Status: DC | PRN
  Administered 2013-08-28: 20 mg via INTRAVENOUS

## 2013-08-28 MED ORDER — ROCURONIUM BROMIDE 100 MG/10ML IV SOLN
INTRAVENOUS | Status: DC | PRN
  Administered 2013-08-28: 100 mg via INTRAVENOUS

## 2013-08-28 MED ORDER — METOPROLOL TARTRATE 25 MG/10 ML ORAL SUSPENSION
12.5000 mg | Freq: Two times a day (BID) | ORAL | Status: DC
  Filled 2013-08-28 (×3): qty 5

## 2013-08-28 MED ORDER — ACETAMINOPHEN 160 MG/5ML PO SOLN: 1000.0000 mg | Freq: Four times a day (QID) | ORAL | Status: DC

## 2013-08-28 MED ORDER — AMIODARONE HCL IN DEXTROSE 360-4.14 MG/200ML-% IV SOLN
60.0000 mg/h | INTRAVENOUS | Status: DC
  Filled 2013-08-28: qty 200

## 2013-08-28 MED ORDER — MILRINONE IN DEXTROSE 20 MG/100ML IV SOLN
0.3750 ug/kg/min | INTRAVENOUS | Status: DC
  Administered 2013-08-28 – 2013-08-29 (×2): 0.375 ug/kg/min via INTRAVENOUS
  Filled 2013-08-28 (×2): qty 100

## 2013-08-28 MED ORDER — SODIUM CHLORIDE 0.9 % IV SOLN
200.0000 ug | INTRAVENOUS | Status: DC | PRN
  Administered 2013-08-28: .3 ug/kg/h via INTRAVENOUS

## 2013-08-28 MED ORDER — METOPROLOL TARTRATE 12.5 MG HALF TABLET
12.5000 mg | ORAL_TABLET | Freq: Two times a day (BID) | ORAL | Status: DC
  Filled 2013-08-28 (×3): qty 1

## 2013-08-28 MED ORDER — POTASSIUM CHLORIDE 10 MEQ/50ML IV SOLN: 10.0000 meq | INTRAVENOUS | Status: AC

## 2013-08-28 MED ORDER — MIDAZOLAM HCL 2 MG/2ML IJ SOLN: 2.0000 mg | INTRAMUSCULAR | Status: DC | PRN

## 2013-08-28 MED ORDER — LACTATED RINGERS IV SOLN
INTRAVENOUS | Status: DC | PRN
  Administered 2013-08-28 (×2): via INTRAVENOUS

## 2013-08-28 MED ORDER — ALBUMIN HUMAN 5 % IV SOLN
250.0000 mL | INTRAVENOUS | Status: AC | PRN
  Administered 2013-08-28 (×2): 250 mL via INTRAVENOUS

## 2013-08-28 MED ORDER — ACETAMINOPHEN 650 MG RE SUPP
650.0000 mg | Freq: Once | RECTAL | Status: AC
  Administered 2013-08-28: 650 mg via RECTAL

## 2013-08-28 MED ORDER — MIDAZOLAM HCL 2 MG/2ML IJ SOLN
INTRAMUSCULAR | Status: DC | PRN
  Administered 2013-08-28: 1 mg via INTRAVENOUS
  Administered 2013-08-28: 2 mg via INTRAVENOUS
  Administered 2013-08-28: 3 mg via INTRAVENOUS

## 2013-08-28 MED ORDER — LACTATED RINGERS IV SOLN
INTRAVENOUS | Status: DC
Start: 2013-08-28 — End: 2013-08-29

## 2013-08-28 MED ORDER — ACETAMINOPHEN 160 MG/5ML PO SOLN: 650.0000 mg | Freq: Once | ORAL | Status: AC

## 2013-08-28 MED ORDER — FAMOTIDINE IN NACL 20-0.9 MG/50ML-% IV SOLN
20.0000 mg | Freq: Two times a day (BID) | INTRAVENOUS | Status: DC
  Administered 2013-08-28: 20 mg via INTRAVENOUS
  Filled 2013-08-28: qty 50

## 2013-08-28 MED ORDER — AMIODARONE HCL IN DEXTROSE 360-4.14 MG/200ML-% IV SOLN
30.0000 mg/h | INTRAVENOUS | Status: DC
Start: 2013-08-28 — End: 2013-08-28
  Filled 2013-08-28: qty 200

## 2013-08-28 MED ORDER — FENTANYL CITRATE 0.05 MG/ML IJ SOLN
INTRAMUSCULAR | Status: DC | PRN
  Administered 2013-08-28 (×2): 100 ug via INTRAVENOUS
  Administered 2013-08-28: 50 ug via INTRAVENOUS
  Administered 2013-08-28: 100 ug via INTRAVENOUS
  Administered 2013-08-28: 50 ug via INTRAVENOUS
  Administered 2013-08-28: 100 ug via INTRAVENOUS
  Administered 2013-08-28: 150 ug via INTRAVENOUS
  Administered 2013-08-28: 200 ug via INTRAVENOUS
  Administered 2013-08-28 (×5): 100 ug via INTRAVENOUS

## 2013-08-28 MED ORDER — SODIUM CHLORIDE 0.9 % IV SOLN
10.0000 g | INTRAVENOUS | Status: DC | PRN
  Administered 2013-08-28: 5 g/h via INTRAVENOUS

## 2013-08-28 MED ORDER — VECURONIUM BROMIDE 10 MG IV SOLR
INTRAVENOUS | Status: DC | PRN
  Administered 2013-08-28 (×2): 3 mg via INTRAVENOUS

## 2013-08-28 MED ORDER — SODIUM CHLORIDE 0.9 % IJ SOLN: 3.0000 mL | INTRAMUSCULAR | Status: DC | PRN

## 2013-08-28 MED ORDER — DEXTROSE 5 % IV SOLN
1.5000 g | Freq: Two times a day (BID) | INTRAVENOUS | Status: AC
  Administered 2013-08-28 – 2013-08-30 (×4): 1.5 g via INTRAVENOUS
  Filled 2013-08-28 (×4): qty 1.5

## 2013-08-28 MED ORDER — AMIODARONE LOAD VIA INFUSION
150.0000 mg | Freq: Once | INTRAVENOUS | Status: AC
  Administered 2013-08-28: 150 mg via INTRAVENOUS

## 2013-08-28 MED ORDER — METOPROLOL TARTRATE 1 MG/ML IV SOLN
2.5000 mg | INTRAVENOUS | Status: DC | PRN
  Administered 2013-08-29: 2.5 mg via INTRAVENOUS

## 2013-08-28 MED ORDER — OXYCODONE HCL 5 MG PO TABS
5.0000 mg | ORAL_TABLET | ORAL | Status: DC | PRN
  Administered 2013-08-29 – 2013-09-08 (×11): 10 mg via ORAL
  Filled 2013-08-28 (×11): qty 2

## 2013-08-28 MED ORDER — AMIODARONE HCL IN DEXTROSE 360-4.14 MG/200ML-% IV SOLN
30.0000 mg/h | INTRAVENOUS | Status: AC
  Administered 2013-08-28 – 2013-08-31 (×7): 30 mg/h via INTRAVENOUS
  Filled 2013-08-28 (×16): qty 200

## 2013-08-28 MED ORDER — BISACODYL 5 MG PO TBEC
10.0000 mg | DELAYED_RELEASE_TABLET | Freq: Every day | ORAL | Status: DC
  Administered 2013-08-29 – 2013-09-08 (×7): 10 mg via ORAL
  Filled 2013-08-28 (×7): qty 2

## 2013-08-28 MED ORDER — SODIUM CHLORIDE 0.9 % IV SOLN
250.0000 mL | INTRAVENOUS | Status: AC
  Administered 2013-08-28: 100 mL/h via INTRAVENOUS

## 2013-08-28 MED ORDER — VANCOMYCIN HCL IN DEXTROSE 1-5 GM/200ML-% IV SOLN
1000.0000 mg | Freq: Once | INTRAVENOUS | Status: AC
Start: 2013-08-29 — End: 2013-08-29
  Administered 2013-08-29: 1000 mg via INTRAVENOUS
  Filled 2013-08-28: qty 200

## 2013-08-28 MED ORDER — AMIODARONE HCL IN DEXTROSE 360-4.14 MG/200ML-% IV SOLN
INTRAVENOUS | Status: DC | PRN
  Administered 2013-08-28: 60 mg/h via INTRAVENOUS

## 2013-08-28 MED ORDER — DOCUSATE SODIUM 100 MG PO CAPS
200.0000 mg | ORAL_CAPSULE | Freq: Every day | ORAL | Status: DC
  Administered 2013-08-29 – 2013-09-08 (×7): 200 mg via ORAL
  Filled 2013-08-28 (×11): qty 2

## 2013-08-28 MED ORDER — LACTATED RINGERS IV SOLN: 500.0000 mL | Freq: Once | INTRAVENOUS | Status: AC | PRN

## 2013-08-28 MED ORDER — FENTANYL CITRATE 0.05 MG/ML IJ SOLN
INTRAMUSCULAR | Status: DC | PRN
  Administered 2013-08-28 (×2): 50 ug via INTRAVENOUS

## 2013-08-28 MED ORDER — SODIUM CHLORIDE 0.9 % IV SOLN
100.0000 [IU] | INTRAVENOUS | Status: DC | PRN
  Administered 2013-08-28: 1 [IU]/h via INTRAVENOUS

## 2013-08-28 MED ORDER — DEXMEDETOMIDINE HCL IN NACL 200 MCG/50ML IV SOLN
0.1000 ug/kg/h | INTRAVENOUS | Status: DC
  Administered 2013-08-28: 0.5 ug/kg/h via INTRAVENOUS

## 2013-08-28 MED ORDER — SODIUM CHLORIDE 0.9 % IJ SOLN
OROMUCOSAL | Status: DC | PRN
  Administered 2013-08-28 (×3): via TOPICAL

## 2013-08-28 MED ORDER — INSULIN REGULAR BOLUS VIA INFUSION
0.0000 [IU] | Freq: Three times a day (TID) | INTRAVENOUS | Status: DC
  Filled 2013-08-28: qty 10

## 2013-08-28 MED ORDER — MIDAZOLAM HCL 5 MG/5ML IJ SOLN
INTRAMUSCULAR | Status: DC | PRN
  Administered 2013-08-28: 1 mg via INTRAVENOUS

## 2013-08-28 MED ORDER — HEPARIN SODIUM (PORCINE) 1000 UNIT/ML IJ SOLN
INTRAMUSCULAR | Status: DC | PRN
  Administered 2013-08-28: 27000 [IU] via INTRAVENOUS
  Administered 2013-08-28: 3000 [IU] via INTRAVENOUS

## 2013-08-28 MED ORDER — SODIUM CHLORIDE 0.45 % IV SOLN
INTRAVENOUS | Status: DC
  Administered 2013-08-28: 20 mL/h via INTRAVENOUS

## 2013-08-28 MED ORDER — SODIUM CHLORIDE 0.9 % IV SOLN
INTRAVENOUS | Status: DC
  Administered 2013-08-28: 20 mL/h via INTRAVENOUS

## 2013-08-28 MED ORDER — SODIUM CHLORIDE 0.9 % IV SOLN
INTRAVENOUS | Status: DC
  Administered 2013-08-28: 2.5 [IU]/h via INTRAVENOUS
  Filled 2013-08-28 (×2): qty 1

## 2013-08-28 SURGICAL SUPPLY — 131 items
ADAPTER CARDIO PERF ANTE/RETRO (ADAPTER) ×1 IMPLANT
ADH SKN CLS LQ APL DERMABOND (GAUZE/BANDAGES/DRESSINGS) ×3
ADPR PRFSN 84XANTGRD RTRGD (ADAPTER) ×3
APL SKNCLS STERI-STRIP NONHPOA (GAUZE/BANDAGES/DRESSINGS) ×3
APPLIER CLIP 9.375 MED OPEN (MISCELLANEOUS)
APPLIER CLIP 9.375 SM OPEN (CLIP) ×4
APR CLP MED 9.3 20 MLT OPN (MISCELLANEOUS)
APR CLP SM 9.3 20 MLT OPN (CLIP) ×3
ATRICLIP EXCLUSION 40 STD HAND (Clip) ×1 IMPLANT
ATTRACTOMAT 16X20 MAGNETIC DRP (DRAPES) ×4 IMPLANT
BAG DECANTER FOR FLEXI CONT (MISCELLANEOUS) ×4 IMPLANT
BANDAGE ELASTIC 4 VELCRO ST LF (GAUZE/BANDAGES/DRESSINGS) ×4 IMPLANT
BANDAGE ELASTIC 6 VELCRO ST LF (GAUZE/BANDAGES/DRESSINGS) ×4 IMPLANT
BANDAGE GAUZE ELAST BULKY 4 IN (GAUZE/BANDAGES/DRESSINGS) ×4 IMPLANT
BASKET HEART (ORDER IN 25'S) (MISCELLANEOUS) ×1
BASKET HEART (ORDER IN 25S) (MISCELLANEOUS) ×3 IMPLANT
BENZOIN TINCTURE PRP APPL 2/3 (GAUZE/BANDAGES/DRESSINGS) ×4 IMPLANT
BLADE STERNUM SYSTEM 6 (BLADE) ×4 IMPLANT
BLADE SURG ROTATE 9660 (MISCELLANEOUS) IMPLANT
CANISTER SUCTION 2500CC (MISCELLANEOUS) ×8 IMPLANT
CANNULA EZ GLIDE AORTIC 21FR (CANNULA) ×4 IMPLANT
CANNULA GUNDRY RCSP 15FR (MISCELLANEOUS) ×1 IMPLANT
CANNULA VENOUS LOW PROF 34X46 (CANNULA) ×4 IMPLANT
CARDIAC SUCTION (MISCELLANEOUS) ×4 IMPLANT
CATH CPB KIT OWEN (MISCELLANEOUS) ×4 IMPLANT
CATH THORACIC 28FR (CATHETERS) IMPLANT
CATH THORACIC 28FR RT ANG (CATHETERS) IMPLANT
CATH THORACIC 36FR (CATHETERS) ×4 IMPLANT
CATH THORACIC 36FR RT ANG (CATHETERS) ×4 IMPLANT
CLIP APPLIE 9.375 MED OPEN (MISCELLANEOUS) IMPLANT
CLIP APPLIE 9.375 SM OPEN (CLIP) IMPLANT
CLIP FOGARTY SPRING 6M (CLIP) IMPLANT
CLIP RETRACTION 3.0MM CORONARY (MISCELLANEOUS) ×1 IMPLANT
CLIP TI MEDIUM 24 (CLIP) IMPLANT
CLIP TI WIDE RED SMALL 24 (CLIP) IMPLANT
CONN ST 1/4X3/8  BEN (MISCELLANEOUS) ×1
CONN ST 1/4X3/8 BEN (MISCELLANEOUS) IMPLANT
CONN Y 3/8X3/8X3/8  BEN (MISCELLANEOUS)
CONN Y 3/8X3/8X3/8 BEN (MISCELLANEOUS) IMPLANT
COVER SURGICAL LIGHT HANDLE (MISCELLANEOUS) ×4 IMPLANT
CRADLE DONUT ADULT HEAD (MISCELLANEOUS) ×4 IMPLANT
DERMABOND ADHESIVE PROPEN (GAUZE/BANDAGES/DRESSINGS) ×1
DERMABOND ADVANCED .7 DNX6 (GAUZE/BANDAGES/DRESSINGS) IMPLANT
DRAIN CHANNEL 32F RND 10.7 FF (WOUND CARE) ×4 IMPLANT
DRAPE CARDIOVASCULAR INCISE (DRAPES) ×4
DRAPE INCISE IOBAN 66X45 STRL (DRAPES) ×8 IMPLANT
DRAPE SLUSH/WARMER DISC (DRAPES) ×4 IMPLANT
DRAPE SRG 135X102X78XABS (DRAPES) ×3 IMPLANT
DRSG COVADERM 4X14 (GAUZE/BANDAGES/DRESSINGS) ×4 IMPLANT
DRSG COVADERM 4X8 (GAUZE/BANDAGES/DRESSINGS) ×4 IMPLANT
ELECT BLADE 4.0 EZ CLEAN MEGAD (MISCELLANEOUS) ×4
ELECT REM PT RETURN 9FT ADLT (ELECTROSURGICAL) ×8
ELECTRODE BLDE 4.0 EZ CLN MEGD (MISCELLANEOUS) IMPLANT
ELECTRODE REM PT RTRN 9FT ADLT (ELECTROSURGICAL) ×6 IMPLANT
GLOVE BIO SURGEON STRL SZ 6 (GLOVE) ×2 IMPLANT
GLOVE BIO SURGEON STRL SZ 6.5 (GLOVE) IMPLANT
GLOVE BIO SURGEON STRL SZ7 (GLOVE) IMPLANT
GLOVE BIO SURGEON STRL SZ7.5 (GLOVE) IMPLANT
GLOVE BIOGEL PI IND STRL 6 (GLOVE) IMPLANT
GLOVE BIOGEL PI IND STRL 6.5 (GLOVE) IMPLANT
GLOVE BIOGEL PI IND STRL 7.0 (GLOVE) IMPLANT
GLOVE BIOGEL PI INDICATOR 6 (GLOVE)
GLOVE BIOGEL PI INDICATOR 6.5 (GLOVE) ×2
GLOVE BIOGEL PI INDICATOR 7.0 (GLOVE) ×1
GLOVE EUDERMIC 7 POWDERFREE (GLOVE) IMPLANT
GLOVE ORTHO TXT STRL SZ7.5 (GLOVE) ×12 IMPLANT
GOWN STRL REUS W/ TWL LRG LVL3 (GOWN DISPOSABLE) ×12 IMPLANT
GOWN STRL REUS W/TWL LRG LVL3 (GOWN DISPOSABLE) ×24
HEMOSTAT POWDER SURGIFOAM 1G (HEMOSTASIS) ×16 IMPLANT
INSERT FOGARTY 61MM (MISCELLANEOUS) IMPLANT
INSERT FOGARTY XLG (MISCELLANEOUS) ×4 IMPLANT
KIT BASIN OR (CUSTOM PROCEDURE TRAY) ×4 IMPLANT
KIT ROOM TURNOVER OR (KITS) ×4 IMPLANT
KIT SUCTION CATH 14FR (SUCTIONS) ×21 IMPLANT
KIT VASOVIEW W/TROCAR VH 2000 (KITS) ×4 IMPLANT
LEAD PACING MYOCARDI (MISCELLANEOUS) ×4 IMPLANT
MARKER GRAFT CORONARY BYPASS (MISCELLANEOUS) ×12 IMPLANT
NS IRRIG 1000ML POUR BTL (IV SOLUTION) ×20 IMPLANT
PACK OPEN HEART (CUSTOM PROCEDURE TRAY) ×4 IMPLANT
PAD ARMBOARD 7.5X6 YLW CONV (MISCELLANEOUS) ×8 IMPLANT
PAD ELECT DEFIB RADIOL ZOLL (MISCELLANEOUS) ×4 IMPLANT
PENCIL BUTTON HOLSTER BLD 10FT (ELECTRODE) ×4 IMPLANT
PUNCH AORTIC ROT 4.0MM RCL 40 (MISCELLANEOUS) ×1 IMPLANT
PUNCH AORTIC ROTATE 4.0MM (MISCELLANEOUS) IMPLANT
PUNCH AORTIC ROTATE 4.5MM 8IN (MISCELLANEOUS) IMPLANT
PUNCH AORTIC ROTATE 5MM 8IN (MISCELLANEOUS) IMPLANT
SET CARDIOPLEGIA MPS 5001102 (MISCELLANEOUS) ×1 IMPLANT
SET IRRIG TUBING LAPAROSCOPIC (IRRIGATION / IRRIGATOR) ×1 IMPLANT
SOLUTION ANTI FOG 6CC (MISCELLANEOUS) ×4 IMPLANT
SPONGE GAUZE 4X4 12PLY (GAUZE/BANDAGES/DRESSINGS) ×8 IMPLANT
SPONGE LAP 18X18 X RAY DECT (DISPOSABLE) ×2 IMPLANT
SPONGE LAP 4X18 X RAY DECT (DISPOSABLE) IMPLANT
SUT BONE WAX W31G (SUTURE) ×4 IMPLANT
SUT ETHIBOND X763 2 0 SH 1 (SUTURE) ×8 IMPLANT
SUT MNCRL AB 3-0 PS2 18 (SUTURE) ×8 IMPLANT
SUT MNCRL AB 4-0 PS2 18 (SUTURE) ×4 IMPLANT
SUT PDS AB 1 CTX 36 (SUTURE) ×9 IMPLANT
SUT PROLENE 2 0 SH DA (SUTURE) IMPLANT
SUT PROLENE 3 0 SH DA (SUTURE) ×4 IMPLANT
SUT PROLENE 3 0 SH1 36 (SUTURE) ×8 IMPLANT
SUT PROLENE 4 0 RB 1 (SUTURE) ×8
SUT PROLENE 4 0 SH DA (SUTURE) ×1 IMPLANT
SUT PROLENE 4-0 RB1 .5 CRCL 36 (SUTURE) IMPLANT
SUT PROLENE 5 0 C 1 36 (SUTURE) IMPLANT
SUT PROLENE 6 0 C 1 30 (SUTURE) ×3 IMPLANT
SUT PROLENE 7.0 RB 3 (SUTURE) ×13 IMPLANT
SUT PROLENE 8 0 BV175 6 (SUTURE) IMPLANT
SUT PROLENE BLUE 7 0 (SUTURE) ×4 IMPLANT
SUT PROLENE POLY MONO (SUTURE) IMPLANT
SUT SILK  1 MH (SUTURE) ×3
SUT SILK 1 MH (SUTURE) ×6 IMPLANT
SUT STEEL 6MS V (SUTURE) IMPLANT
SUT STEEL STERNAL CCS#1 18IN (SUTURE) ×1 IMPLANT
SUT STEEL SZ 6 DBL 3X14 BALL (SUTURE) ×2 IMPLANT
SUT VIC AB 1 CTX 36 (SUTURE)
SUT VIC AB 1 CTX36XBRD ANBCTR (SUTURE) IMPLANT
SUT VIC AB 2-0 CT1 27 (SUTURE) ×4
SUT VIC AB 2-0 CT1 TAPERPNT 27 (SUTURE) IMPLANT
SUT VIC AB 2-0 CTX 27 (SUTURE) ×8 IMPLANT
SUT VIC AB 3-0 SH 27 (SUTURE)
SUT VIC AB 3-0 SH 27X BRD (SUTURE) IMPLANT
SUT VIC AB 3-0 X1 27 (SUTURE) ×1 IMPLANT
SUT VICRYL 4-0 PS2 18IN ABS (SUTURE) IMPLANT
SUTURE E-PAK OPEN HEART (SUTURE) ×4 IMPLANT
SYSTEM SAHARA CHEST DRAIN ATS (WOUND CARE) ×8 IMPLANT
TOWEL OR 17X24 6PK STRL BLUE (TOWEL DISPOSABLE) ×8 IMPLANT
TOWEL OR 17X26 10 PK STRL BLUE (TOWEL DISPOSABLE) ×8 IMPLANT
TRAY FOLEY IC TEMP SENS 14FR (CATHETERS) ×4 IMPLANT
TUBING INSUFFLATION 10FT LAP (TUBING) ×4 IMPLANT
UNDERPAD 30X30 INCONTINENT (UNDERPADS AND DIAPERS) ×4 IMPLANT
WATER STERILE IRR 1000ML POUR (IV SOLUTION) ×8 IMPLANT

## 2013-08-28 NOTE — Progress Notes (Signed)
Subjective: Patient was feeling better, well aware about the CABG and VATS today, looking little anxious but stating that he is fine  And very positive about the recovery. States that he is still unable to void since his foley's was removed due to leakage, but denies any burning or dysuria. Objective: Vital signs in last 24 hours: Filed Vitals:   08/27/13 2342 08/28/13 0100 08/28/13 0642 08/28/13 0939  BP: 129/85  159/86 143/120  Pulse: 93  99 115  Temp: 97.9 F (36.6 C)  98.2 F (36.8 C) 98.5 F (36.9 C)  TempSrc: Oral  Oral Oral  Resp: 18  20 18   Height:      Weight:  80.8 kg (178 lb 2.1 oz)    SpO2: 99%  100% 99%   Weight change: -3.2 kg (-7 lb 0.9 oz)  Intake/Output Summary (Last 24 hours) at 08/28/13 1349 Last data filed at 08/27/13 2341  Gross per 24 hour  Intake    360 ml  Output    800 ml  Net   -440 ml    EXAM:  GENERAL: Alert and oriented , comfortable, looks little anxious. HEENT: PERRL, EOMI  NECK: Supple, no JVD, no adenopathy  CHEST: left Ct in place, no erythema, or swelling. LUNG: Clear B/L with much improved air entry as compare to before. Still mildly decreased breath sound at left base.  HEART: RRR, S1 and S@, no MRG  ABD: S/ND/NT. No HSM. BS +  NEURO;A & O X 3. Moving all extremities, no gross neuro. Deficit  TDV:VOHYW pedal edema,no cyanosis, PP 2+ B/L    Lab Results: @LABTEST2 @ Micro Results: Recent Results (from the past 240 hour(s))  MRSA PCR SCREENING     Status: None   Collection Time    08/18/13  4:00 PM      Result Value Ref Range Status   MRSA by PCR NEGATIVE  NEGATIVE Final   Comment:            The GeneXpert MRSA Assay (FDA     approved for NASAL specimens     only), is one component of a     comprehensive MRSA colonization     surveillance program. It is not     intended to diagnose MRSA     infection nor to guide or     monitor treatment for     MRSA infections.  SURGICAL PCR SCREEN     Status: None   Collection Time   08/27/13  9:47 PM      Result Value Ref Range Status   MRSA, PCR NEGATIVE  NEGATIVE Final   Staphylococcus aureus NEGATIVE  NEGATIVE Final   Comment:            The Xpert SA Assay (FDA     approved for NASAL specimens     in patients over 39 years of age),     is one component of     a comprehensive surveillance     program.  Test performance has     been validated by [employer] for patients greater     than or equal to 25 year old.     It is not intended     to diagnose infection nor to     guide or monitor treatment.   Studies/Results: Dg Chest Port 1 View  08/27/2013   CLINICAL DATA:  Chest tube.  EXAM: PORTABLE CHEST - 1 VIEW  COMPARISON:  Chest CT 08/24/2013.  Chest x-ray 08/24/2013.  FINDINGS: Left chest tube in stable position . Mediastinum and hilar structures are normal. Stable cardiomegaly with normal pulmonary vascularity. Stable left lower lobe atelectasis and/or infiltrate and left-sided pleural fluid collection consistent with previously identified hemothorax. Multiple left rib fractures are better demonstrated by CT. No pneumothorax.  IMPRESSION: 1. Stable chest tube positioning. 2. Stable left hemothorax. 3. Stable left lower lobe atelectasis and/or infiltrate. 4. Multiple left rib fractures, better seen by prior chest CT of 08/24/2013.   Electronically Signed   By: Marcello Moores  Register   On: 08/27/2013 07:29   Medications: medication reviewed Scheduled Meds: . aminocaproic acid (AMICAR) for OHS   Intravenous To OR  . [MAR HOLD] bacitracin   Topical BID  . cefUROXime (ZINACEF)  IV  750 mg Intravenous To OR  . Chlorhexidine Gluconate Cloth  6 each Topical Once  . dexmedetomidine  0.1-0.7 mcg/kg/hr Intravenous To OR  . DOPamine  2-20 mcg/kg/min Intravenous To OR  . epinephrine  0.5-20 mcg/min Intravenous To OR  . heparin 30,000 units/NS 1000 mL solution for CELLSAVER   Other To OR  . magnesium sulfate  40 mEq Other To OR  . metoprolol tartrate  12.5 mg Oral Once  .  nitroGLYCERIN  2-200 mcg/min Intravenous To OR  . phenylephrine (NEO-SYNEPHRINE) Adult infusion  30-200 mcg/min Intravenous To OR  . potassium chloride  80 mEq Other To OR  . sodium chloride  3 mL Intravenous Q12H   Continuous Infusions:  PRN Meds:.sodium chloride, sodium chloride, sodium chloride irrigation, Surgifoam 1 Gm with 0.9% sodium chloride (4 ml) topical solution Assessment/Plan: A FIB: His rate in between 92 - 94 today, improved, asymptomatic, not on anticoagulation due to his hemothorax. Continue with Cardizem and metoprolol for rate control, Card. Advice putting him on anticoagulation once hemothorax resolved and proceed with DCCV if A fib persists. Going for CABG and VATS today. LEFT HEMOTHORAX: Still having some blood mixed serous discharge, his CXR shows same LL atelectasis vs infiltrate, possible a blood clot,going for VATS along with CABG today NSTMI: Had his CATH. Yesterday which shows multi-vessel CAD with progression of disease in the large caliber intermediate branch and the small to moderate caliber Diagonal branch, CTO of LAD and Circumflex and small RCA that is diffusely diseased. Card. Schedule for CABG today as recommended after CATH. Yesterday. SYNCOPE: No new episode, feeling better. Already have his LINQ recorder.Continue monitoring.  RIB FRACTURE: His pain has improved , states that some time have pain of 2-3 intensity with movements.Continue with pain management and PT/OT to help him heel.  BACK LACERATION:Continue with wound care.  HTN: His BP was 159/86 today.CT will advice about his post op. meds. DM: His CBG looks good today with 156 this morning. Continue with novo lox and lantus.  URINARY RETENTION :Foley's was removed after leakage last night, still unable to void since then, His UA looks cloudy with some leukocyte and few bacteria.Will get a new Foley's today as going for CABG and VAT today.     This is a Careers information officer Note.  The care of the patient was  discussed with Dr. Gordy Levan and the assessment and plan formulated with their assistance.  Please see their attached note for official documentation of the daily encounter.   LOS: 10 days   Lorella Nimrod, Med Student 08/28/2013, 1:49 PM

## 2013-08-28 NOTE — Anesthesia Preprocedure Evaluation (Addendum)
Anesthesia Evaluation  Patient identified by MRN, date of birth, ID band Patient awake    Reviewed: Allergy & Precautions, H&P , NPO status , Patient's Chart, lab work & pertinent test results  History of Anesthesia Complications Negative for: history of anesthetic complications  Airway Mallampati: II TM Distance: >3 FB Neck ROM: Full    Dental  (+) Teeth Intact, Poor Dentition, Dental Advisory Given   Pulmonary shortness of breath, asthma , former smoker,    + decreased breath sounds      Cardiovascular hypertension, Pt. on home beta blockers + CAD, + Past MI and +CHF Rhythm:Irregular Rate:Tachycardia     Neuro/Psych negative neurological ROS  negative psych ROS   GI/Hepatic negative GI ROS, Neg liver ROS,   Endo/Other  diabetes  Renal/GU Renal InsufficiencyRenal disease     Musculoskeletal   Abdominal   Peds  Hematology   Anesthesia Other Findings   Reproductive/Obstetrics                          Anesthesia Physical Anesthesia Plan  ASA: IV  Anesthesia Plan: General   Post-op Pain Management:    Induction: Intravenous  Airway Management Planned: Oral ETT  Additional Equipment: Arterial line, 3D TEE and PA Cath  Intra-op Plan:   Post-operative Plan: Post-operative intubation/ventilation  Informed Consent: I have reviewed the patients History and Physical, chart, labs and discussed the procedure including the risks, benefits and alternatives for the proposed anesthesia with the patient or authorized representative who has indicated his/her understanding and acceptance.   Dental advisory given  Plan Discussed with: CRNA, Anesthesiologist and Surgeon  Anesthesia Plan Comments:        Anesthesia Quick Evaluation

## 2013-08-28 NOTE — Op Note (Signed)
CARDIOTHORACIC SURGERY OPERATIVE NOTE  Date of Procedure: 08/28/2013  Preoperative Diagnosis:   Severe 3-vessel Coronary Artery Disease  Left Hemothorax  Atrial Fibrillation  Postoperative Diagnosis: Same  Procedure:    Coronary Artery Bypass Grafting x 3   Left Internal Mammary Artery to Distal Left Anterior Descending Coronary Artery  Saphenous Vein Graft to Ramus Intermediate Branch Coronary Artery  Sapheonous Vein Graft to Diagonal Branch Coronary Artery  Endoscopic Vein Harvest from Right Thigh and Lower Leg   Evacuation of Left Hemothorax   Clipping of Left Atrial Appendage  Atricure Left Atrial Clip, size 40 mm   Surgeon: Valentina Gu. Roxy Manns, MD  Assistant: John Giovanni, PA-C  Anesthesia: Delma Freeze, MD  Operative Findings: Moderate-sized clotted left hemothorax  Multiple left sided rib fractures  Normal LV systolic function  Good quality LIMA conduit for grafting  Good quality SVG conduit for grafting  Fair quality target vessels for grafting Terminal branches of distal left circumflex and right coronary artery were too small for grafting     BRIEF CLINICAL NOTE AND INDICATIONS FOR SURGERY  Patient is a 68 year old male with known history of coronary artery disease with ischemic cardiomyopathy, hypertension, chronic systolic congestive heart failure, hyperlipidemia, chronic kidney disease, and type 2 diabetes mellitus who was admitted to the hospital following a syncopal episode complicated by a fall which caused multiple left-sided rib fractures on 08/18/2013. The patient was initially evaluated by the trauma service at the time of admission he noted to have multiple left-sided rib fractures without associated pneumothorax or hemothorax. He was admitted to the medical service and seen in consultation by the cardiology team because of his underlying presentation with syncope. He ruled in for an acute non-ST segment elevation myocardial infarction based  on serial cardiac enzymes. Over the ensuing several days the patient developed worsening left-sided chest pain and shortness of breath, and followup chest radiograph demonstrated a large left-sided hemothorax. A 32 French chest tube was placed by Dr. Hulen Skains on 08/21/2013 which initially drained 1100 mL of bloody fluid. Symptoms were improved and followup chest radiograph demonstrated partial reexpansion of the left lung. Followup chest CT scan was performed today which demonstrates the chest tube located within the major fissure and a moderate amount of residual clotted blood in the posterior hemithorax with associated left lower lobe atelectasis. Cardiothoracic surgical consultation has been requested.  The patient subsequently underwent cardiac catheterization which revealed severe 3-vessel coronary artery disease with considerable progression of disease in comparison with previous catheterization.  The patient has been seen in consultation and counseled at length regarding the indications, risks and potential benefits of surgery.  All questions have been answered, and the patient provides full informed consent for the operation as described.     DETAILS OF THE OPERATIVE PROCEDURE  Preparation:  The patient is brought to the operating room on the above mentioned date and central monitoring was established by the anesthesia team including placement of Swan-Ganz catheter and radial arterial line. The patient is placed in the supine position on the operating table.  Intravenous antibiotics are administered. General endotracheal anesthesia is induced uneventfully. The patient is initially intubated using a dual lumen endotracheal tube.  A Foley catheter is placed.  The old chest tube on the left side is removed.  Baseline transesophageal echocardiogram was performed.  Findings were notable for Normal left ventricular size and systolic function. There was mild mitral regurgitation. No other abnormalities were  noted.  The patient's chest, abdomen, both groins, and  both lower extremities are prepared and draped in a sterile manner. A time out procedure is performed.   Surgical Approach and Evacuation of Left Hemothorax:  A median sternotomy incision was performed.  The left pleural space is entered. Single lung ventilation is begun.  There is a moderate size partially loculated pleural effusion and a moderate to large amount of old clotted blood. There is acute inflammation with some adhesions surrounding the lung which were taken down using electrocautery.  Once all of the adhesions were adequately broken up all of the old clotted blood was evacuated. The left pleural space was irrigated using warm saline. There are multiple rib fractures located laterally with signs of early healing and surrounding old hematoma within the chest wall. Once all of the hemothorax is been completely evacuated and the pleural spaces been rinsed clean, the left lung is selectively ventilated to facilitate is much reexpansion is possible. The left pleural space was drained using 2 separate chest tubes placed through separate stab incisions inferiorly.   Conduit Harvest:  The left internal mammary artery is dissected from the chest wall and prepared for bypass grafting. There are some adhesions and acute inflammation around the left internal mammary artery which makes harvest somewhat tedious.  However, the left internal mammary artery is notably good quality conduit. Simultaneously, saphenous vein is obtained from the patient's right thigh and leg using endoscopic vein harvest technique. The saphenous vein is notably good quality conduit. After removal of the saphenous vein, the small surgical incisions in the lower extremity are closed with absorbable suture. Following systemic heparinization, the left internal mammary artery was transected distally noted to have excellent flow.   Extracorporeal Cardiopulmonary Bypass and  Myocardial Protection:  The pericardium is opened. The ascending aorta is normal in appearance. The ascending aorta and the right atrium are cannulated for cardioplegia bypass.  Adequate heparinization is verified.    A retrograde cardioplegia cannula is placed through the right atrium into the coronary sinus.  The entire pre-bypass portion of the operation was notable for stable hemodynamics.  Cardiopulmonary bypass was begun and the surface of the heart is inspected. Distal target vessels are selected for coronary artery bypass grafting. Of note, the terminal branches off the distal left circumflex coronary artery are extremely small, too small for grafting. Similarly, the terminal branches of the right coronary artery are too small for grafting.  A cardioplegia cannula is placed in the ascending aorta.  A temperature probe was placed in the interventricular septum.  The patient is allowed to cool passively to [facility] systemic temperature.  The aortic cross clamp is applied and cold blood cardioplegia is delivered initially in an antegrade fashion through the aortic root.   Supplemental cardioplegia is given retrograde through the coronary sinus catheter.  Iced saline slush is applied for topical hypothermia.  The initial cardioplegic arrest is rapid with early diastolic arrest.  Repeat doses of cardioplegia are administered intermittently throughout the entire cross clamp portion of the operation through the aortic root, through the coronary sinus catheter, and through subsequently placed vein grafts in order to maintain completely flat electrocardiogram and septal myocardial temperature below 15C.  Myocardial protection was felt to be excellent.   Clipping of Left Atrial Appendage:  The left atrial appendage was obliterated using and the AtriCure left atrial clip, size 40 mm.   Coronary Artery Bypass Grafting:   The ramus intermediate branch coronary artery was grafted using a reversed saphenous  vein graft in an end-to-side fashion.  At the site of distal anastomosis the target vessel was fair quality and measured approximately 1.8 mm in diameter.  The diagonal branch of the left anterior descending coronary artery was grafted using a reversed saphenous vein graft in an end-to-side fashion.  At the site of distal anastomosis the target vessel was fair quality and measured approximately 1.3 mm in diameter.  The distal left anterior coronary artery was grafted with the left internal mammary artery in an end-to-side fashion.  At the site of distal anastomosis the target vessel was fair quality and measured approximately 1.4 mm in diameter.  All proximal vein graft anastomoses were placed directly to the ascending aorta prior to removal of the aortic cross clamp.  The septal myocardial temperature rose appropriately after reperfusion of the left internal mammary artery graft.  One final dose of warm retrograde "hot shot" cardioplegia was administered retrograde through the coronary sinus catheter.  The aortic cross clamp was removed after a total cross clamp time of 73 minutes.   Procedure Completion:  All proximal and distal coronary anastomoses were inspected for hemostasis and appropriate graft orientation. Epicardial pacing wires are fixed to the right ventricular outflow tract and to the right atrial appendage. The patient is rewarmed to 37C temperature. The patient is weaned and disconnected from cardiopulmonary bypass.  The patient's rhythm at separation from bypass was sinus.  The patient was weaned from cardioplegic bypass without any inotropic support. Atrial pacing was utilized to increase her heart rate.  Total cardiopulmonary bypass time for the operation was 89 minutes.  Followup transesophageal echocardiogram performed after separation from bypass revealed no changes from the preoperative exam.  The aortic and venous cannula were removed uneventfully. Protamine was administered to  reverse the anticoagulation. The mediastinum and pleural space were inspected for hemostasis and irrigated with saline solution. The mediastinum and the left pleural space were drained using 4 chest tubes placed through separate stab incisions inferiorly.  The soft tissues anterior to the aorta were reapproximated loosely. The sternum is closed with double strength sternal wire. The soft tissues anterior to the sternum were closed in multiple layers and the skin is closed with a running subcuticular skin closure.  The post-bypass portion of the operation was notable for stable rhythm and hemodynamics.  Amiodarone infusion was begun because of preoperative atrial fibrillation and atrial flutter.  The patient received 2 units packed red blood cells during the procedure due to anemia which was present preoperatively and exacerbated by acute blood loss and hemodilution during cardiopulmonary bypass.    Disposition:  The patient tolerated the procedure well and is transported to the surgical intensive care in stable condition. There are no intraoperative complications. All sponge instrument and needle counts are verified correct at completion of the operation.    Valentina Gu. Roxy Manns MD 08/28/2013 4:41 PM

## 2013-08-28 NOTE — Progress Notes (Signed)
Agree with student Amin note see Dr. Gills note for more details.  [place] MD  

## 2013-08-28 NOTE — Progress Notes (Signed)
  Echocardiogram Echocardiogram Transesophageal (OR) has been performed.  Gerald Rosario 08/28/2013, 2:21 PM

## 2013-08-28 NOTE — Progress Notes (Signed)
Subjective:   Day of hospitalization: 10  VSS.  Hgb stable at 7.9.  S/p 1 unit of prbcs on 5/21.  He is down 6.8L since admission.  Cath yesterday revealed: pt has known multivessel disease with occluded LAD, circumflex and RCA.  The proximal diagonal branch now has an 80-90% stenosis in the proximal ramus intermedius as a 60-70% stenosis prior to bifurcation.  Relatively normal LVED.    Plan today for CABG and VATS.      Objective:   Vital signs in last 24 hours: Filed Vitals:   08/27/13 2342 08/28/13 0100 08/28/13 0642 08/28/13 0939  BP: 129/85  159/86 143/120  Pulse: 93  99 115  Temp: 97.9 F (36.6 C)  98.2 F (36.8 C) 98.5 F (36.9 C)  TempSrc: Oral  Oral Oral  Resp: 18  20 18   Height:      Weight:  178 lb 2.1 oz (80.8 kg)    SpO2: 99%  100% 99%    Weight: Filed Weights   08/26/13 0348 08/27/13 0300 08/28/13 0100  Weight: 184 lb 8.4 oz (83.7 kg) 185 lb 3 oz (84 kg) 178 lb 2.1 oz (80.8 kg)    Ins/Outs:  Intake/Output Summary (Last 24 hours) at 08/28/13 1556 Last data filed at 08/28/13 1530  Gross per 24 hour  Intake   1360 ml  Output   1740 ml  Net   -380 ml    Physical Exam: Constitutional: Vital signs reviewed.  Patient lying in bed with left chest tube in place; NAD.  HEENT: [place]/AT; PERRL, EOMI, conjunctivae normal, no scleral icterus  Cardiovascular: RRR, no MRG Pulmonary/Chest: mildly increased respiratory effort, minimal accessory muscle use, left chest tube in place; without rales, wheezes, or rhonchi Abdominal: Soft. Mildly distended, +BS, NT/ND Neurological: A&O x3, CN II-XII grossly intact; non-focal exam Musculoskeletal: 15cm laceration R upper back at approximately the T3 level.   Abrasions noted left mid thoracic and back region with TTP. No midline spinal tenderness noted.  Extremities: 2+DP b/l, no C/C, b/l LE trace pitting edema Skin: Warm, dry.    Lab Results:  BMP:  Recent Labs Lab 08/26/13 0335 08/27/13 0720 08/28/13 0223  NA  131* 133* 131*  K 4.9 4.5 4.5  CL 96 99 97  CO2 21 19 21   GLUCOSE 171* 120* 156*  BUN 34* 23 25*  CREATININE 1.05 0.90 0.95  CALCIUM 8.4 8.4 8.3*  MG 2.9* 2.6*  --    Anion Gap:  15  CBC:  Recent Labs Lab 08/22/13 0425 08/23/13 0315  08/27/13 0720 08/28/13 0223 08/28/13 1500  WBC 9.7 11.3*  < > 11.0* 11.6*  --   NEUTROABS 8.1* 9.0*  --   --   --   --   HGB 8.3* 8.2*  < > 8.3* 7.9* 7.4*  HCT 25.0* 23.5*  < > 25.3* 23.1* 21.2*  MCV 87.4 85.5  < > 86.1 84.6  --   PLT 226 246  < > 366 359 215  < > = values in this interval not displayed.  Coagulation:  Recent Labs Lab 08/28/13 0223  LABPROT 16.5*  INR 1.37    CBG:            Recent Labs Lab 08/26/13 2147 08/27/13 0846 08/27/13 1307 08/27/13 1741 08/27/13 2218 08/28/13 0937  GLUCAP 114* 118* 165* 166* 143* 145*           HA1C:       Recent Labs Lab 08/28/13 0223  HGBA1C  6.7*    Lipid Panel: No results found for this basename: CHOL, HDL, LDLCALC, TRIG, CHOLHDL, LDLDIRECT,  in the last 168 hours  LFTs: No results found for this basename: AST, ALT, ALKPHOS, BILITOT, PROT, ALBUMIN,  in the last 168 hours  Pancreatic Enzymes: No results found for this basename: LIPASE, AMYLASE,  in the last 168 hours  Lactic Acid/Procalcitonin: No results found for this basename: LATICACIDVEN, PROCALCITON,  in the last 168 hours  Ammonia: No results found for this basename: AMMONIA,  in the last 168 hours  Cardiac Enzymes: No results found for this basename: CKTOTAL, CKMB, CKMBINDEX, TROPONINI,  in the last 168 hours  EKG: EKG Interpretation  Date/Time:  Monday Aug 18 2013 04:59:50 EDT Ventricular Rate:  78 PR Interval:  169 QRS Duration: 107 QT Interval:  394 QTC Calculation: 449 R Axis:   6 Text Interpretation:  Sinus rhythm Anterior infarct, old ED PHYSICIAN INTERPRETATION AVAILABLE IN [place] Confirmed by TEST, Record ([id number]) on 08/20/2013 7:15:39 AM   BNP: No results found for this  basename: PROBNP,  in the last 168 hours  D-Dimer: No results found for this basename: DDIMER,  in the last 168 hours  Urinalysis:  Recent Labs Lab 08/27/13 2145  COLORURINE YELLOW  LABSPEC 1.020  PHURINE 5.0  GLUCOSEU NEGATIVE  HGBUR SMALL*  BILIRUBINUR NEGATIVE  KETONESUR 15*  PROTEINUR NEGATIVE  UROBILINOGEN 1.0  NITRITE NEGATIVE  LEUKOCYTESUR SMALL*    Micro Results: Recent Results (from the past 240 hour(s))  MRSA PCR SCREENING     Status: None   Collection Time    08/18/13  4:00 PM      Result Value Ref Range Status   MRSA by PCR NEGATIVE  NEGATIVE Final   Comment:            The GeneXpert MRSA Assay (FDA     approved for NASAL specimens     only), is one component of a     comprehensive MRSA colonization     surveillance program. It is not     intended to diagnose MRSA     infection nor to guide or     monitor treatment for     MRSA infections.  SURGICAL PCR SCREEN     Status: None   Collection Time    08/27/13  9:47 PM      Result Value Ref Range Status   MRSA, PCR NEGATIVE  NEGATIVE Final   Staphylococcus aureus NEGATIVE  NEGATIVE Final   Comment:            The Xpert SA Assay (FDA     approved for NASAL specimens     in patients over 48 years of age),     is one component of     a comprehensive surveillance     program.  Test performance has     been validated by [employer] for patients greater     than or equal to 75 year old.     It is not intended     to diagnose infection nor to     guide or monitor treatment.    Blood Culture: No results found for this basename: sdes,  specrequest,  cult,  reptstatus    Studies/Results: Dg Chest Port 1 View  08/27/2013   CLINICAL DATA:  Chest tube.  EXAM: PORTABLE CHEST - 1 VIEW  COMPARISON:  Chest CT 08/24/2013.  Chest x-ray 08/24/2013.  FINDINGS: Left chest tube in  stable position . Mediastinum and hilar structures are normal. Stable cardiomegaly with normal pulmonary vascularity. Stable left  lower lobe atelectasis and/or infiltrate and left-sided pleural fluid collection consistent with previously identified hemothorax. Multiple left rib fractures are better demonstrated by CT. No pneumothorax.  IMPRESSION: 1. Stable chest tube positioning. 2. Stable left hemothorax. 3. Stable left lower lobe atelectasis and/or infiltrate. 4. Multiple left rib fractures, better seen by prior chest CT of 08/24/2013.   Electronically Signed   By: [place]   On: 08/27/2013 07:29    Medications:  Scheduled Meds: . aminocaproic acid (AMICAR) for OHS   Intravenous To OR  . [MAR HOLD] bacitracin   Topical BID  . cefUROXime (ZINACEF)  IV  750 mg Intravenous To OR  . Chlorhexidine Gluconate Cloth  6 each Topical Once  . dexmedetomidine  0.1-0.7 mcg/kg/hr Intravenous To OR  . DOPamine  2-20 mcg/kg/min Intravenous To OR  . epinephrine  0.5-20 mcg/min Intravenous To OR  . heparin 30,000 units/NS 1000 mL solution for CELLSAVER   Other To OR  . magnesium sulfate  40 mEq Other To OR  . metoprolol tartrate  12.5 mg Oral Once  . nitroGLYCERIN  2-200 mcg/min Intravenous To OR  . phenylephrine (NEO-SYNEPHRINE) Adult infusion  30-200 mcg/min Intravenous To OR  . potassium chloride  80 mEq Other To OR  . sodium chloride  3 mL Intravenous Q12H   Continuous Infusions: . amiodarone    . amiodarone    . phenylephrine (NEO-SYNEPHRINE) Adult infusion     PRN Meds: sodium chloride, sodium chloride, sodium chloride irrigation, Surgifoam 1 Gm with 0.9% sodium chloride (4 ml) topical solution  Antibiotics: Antibiotics Given (last 72 hours)   Date/Time Action Medication Dose   08/28/13 1220 Given   vancomycin (VANCOCIN) 1,500 mg in sodium chloride 0.9 % 250 mL IVPB 1,500 mg   08/28/13 1220 Given   cefUROXime (ZINACEF) 1.5 g in dextrose 5 % 50 mL IVPB 1.5 g   08/28/13 1310 Given  [Expires 09/10/2013 @1800 ]   vancomycin (VANCOCIN) 1,000 mg in sodium chloride 0.9 % 1,000 mL irrigation    08/28/13 1547 Given    cefUROXime (ZINACEF) 1.5 g in dextrose 5 % 50 mL IVPB 0.75 g      Day of Hospitalization: 10  Consults: Treatment Team:  Rounding [facility], MD Rexene Alberts, MD  Assessment/Plan:   Principal Problem:   Syncope Active Problems:   HYPERTENSION   Coronary artery disease   Ischemic cardiomyopathy   Diabetes mellitus   Hyperkalemia   Chronic systolic heart failure   Multiple fractures of ribs of left side   Laceration of skin of scalp   Hematoma of occipital surface of head   Chronic sinusitis   Laceration of skin   NSTEMI (non-ST elevated myocardial infarction)   Pleural effusion   Urine retention   Syncope and collapse   Pleural effusion, left   Hemothorax on left   A-fib  Multiple segmental rib fractures with clotted left hemothorax Pain improving.  5/24 CT chest showed moderate amt of residual clotted blood in posterior hemithorax with LLL atx.  CVTS (Dr. Roxy Manns) consulted and is recommending VATS but should undergo diagnostic cath prior to surgery.  Cath showed 80-90% occlusion.  -CABG and VATS today per CVTS -pain management -pulmonary toilet/IS  Acute atrial fibrillation with RVR  Stable. HR controlled.  Has been in a-fib with HR up to 140s; was asymptomatic.  Cardiology consulted.  TSH wnl.  -metoprolol for rate control  to 25mg  bid per cards -no heparin gtt per cards  Syncope  Stable-no further episodes this admission. Trops trended down.  EP consulted and placed implantable loop recorder.  5/19 echo: LVEF 55-60% with LA dilation but unchanged from 2013 echo.   -appreciate EP recs -monitor  NSTEMI Stable.  Trops trended down.  Lipid panel OK.  Known severe multivessel CAD as previously described with occluded LAD, circumflex and RCA. The proximal diagonal branch now has an 80-90% stenosis in the proximal ramus intermedius as a 60-70% stenosis prior to bifurcation. Relatively normal LVEDP. -CABG and VATS today -atorvastatin to 40mg  qd  Acute on chronic  normocytic anemia Pt with acute blood loss d/t multiple rib fractures.  Anemia panel revealed normal retic, ferritin wnl, iron low at 13, and TIBC low at 205.  Folate wnl.  B12 low at 287, will get MMA.  Pt never had colonoscopy and will need as outpatient.   -MMA pending -pt needs GI follow-up as outpatient  Upper back skin laceration 15 cm laceration at risk for skin necrosis.  Laceration repaired by trama service. -monitor -bacitracin applied bid  Left suboccipital scalp hematoma and laceration -monitor  AKI Resolved.  Baseline is 1.1-1.2.  He was noted to have mild elevated Cr at 1.7 on admission. CK trended down.  -hold lasix -avoid nephrotoxic meds -BMP in am  Leukocytosis Likely stress response.  h/o hypertension  BP stable.  On home lasix, imdur and metoprolol.  -metoprolol 25mg  bid, cardizem 60mg  qid  DMII Lab Results  Component Value Date   HGBA1C 6.7* 08/28/2013    Recent Labs  08/27/13 1741 08/27/13 2218 08/28/13 0937  GLUCAP 166* 143* 145*  -hold home glipizide -Lantus 10 units QHS -SSI  Chronic sinusitis This is an incidental finding on CT head.   -follow-up as outpatient    Deconditioning PT recommending SNF but pt not amenable-would like HH.    Mild hyponatremia Asymptomatic.  -monitor BMP  Hyperkalemia Resolved.  No EKG changes.  Given kayexalate x 2 without any BM.  -BMP in AM  VTE SCDs  Dispo: Disposition is deferred at this time, awaiting improvement of current medical problems. Anticipated discharge in approximately 2-3 day(s).  Pt is not interested in going to inpatient rehab or SNF.  Would like to go home with [facility].    The patient does have a current PCP Charolotte Eke Powers) and does need an [facility] hospital follow-up appointment after discharge.   Signed: Jones Bales, MD 08/28/2013, 3:56 PM

## 2013-08-28 NOTE — Anesthesia Procedure Notes (Signed)
Procedures   The patient was identified and consent obtained.  TO was performed, and full barrier precautions were used.  The skin was anesthetized with lidocaine.  Once the vein was located with the 22 ga. needle using ultrasound guidance , the wire was inserted into the vein.  The wire location was confirmed with ultrasound.  The insertion site was dilated and the introducer was carefully inserted and sutured in place. The PAC was checked, and floated into the PA.  Once in the PA, the catheter was secured. The patient tolerated the procedure well.  CXR was ordered for PACU. Start: 1050 End: 1100 J. Tedra [place], MD

## 2013-08-28 NOTE — Anesthesia Postprocedure Evaluation (Signed)
  Anesthesia Post-op Note  Patient: Gerald Rosario  Procedure(s) Performed: Procedure(s): CORONARY ARTERY BYPASS GRAFTING (CABG) x 3: LIMA-LAD, SVG-Ramus, SVG-Diagonal  (N/A) INTRAOPERATIVE TRANSESOPHAGEAL ECHOCARDIOGRAM (N/A) CLIPPING OF ATRIAL APPENDAGE (N/A) EVACUATION OF LEFT HEMOTHORAX (Left)  Patient Location: SICU  Anesthesia Type:General  Level of Consciousness: sedated and Patient remains intubated per anesthesia plan  Airway and Oxygen Therapy: Patient remains intubated per anesthesia plan and Patient placed on Ventilator (see vital sign flow sheet for setting)  Post-op Pain: none  Post-op Assessment: Post-op Vital signs reviewed, Patient's Cardiovascular Status Stable, Respiratory Function Stable and Patent Airway  Post-op Vital Signs: Reviewed and stable  Last Vitals:  Filed Vitals:   08/28/13 0939  BP: 143/120  Pulse: 115  Temp: 36.9 C  Resp: 18    Complications: No apparent anesthesia complications

## 2013-08-28 NOTE — Brief Op Note (Addendum)
      [address]       [place] [address]             [phone]     08/18/2013 - 08/28/2013  3:27 PM  PATIENT:  Gerald Rosario  68 y.o. male  PRE-OPERATIVE DIAGNOSIS:  Coronary artery disease, left hemothorax  POST-OPERATIVE DIAGNOSIS:  Coronary artery disease, left hemothorax  PROCEDURE:  Procedure(s): CORONARY ARTERY BYPASS GRAFTING (CABG) X3 LIMA-LAD; SVG-RAMUS; SVG-DIAG INTRAOPERATIVE TRANSESOPHAGEAL ECHOCARDIOGRAM CLIPPING OF ATRIAL APPENDAGE EVACUATION OF LEFT HEMOTHORAX EVH RIGHT LEG  SURGEON:    Rexene Alberts, MD  ASSISTANTS:  John Giovanni, PA-C  ANESTHESIA:   Duane Boston, MD  CROSSCLAMP TIME:   2'  CARDIOPULMONARY BYPASS TIME: 89'  FINDINGS:  Moderate-sized clotted left hemothorax  Multiple left sided rib fractures  Normal LV systolic function  Good quality LIMA conduit for grafting  Good quality SVG conduit for grafting  Fair quality target vessels for grafting  COMPLICATIONS: None  BASELINE WEIGHT: 81 kg  PATIENT DISPOSITION:   TO SICU IN STABLE CONDITION  Rexene Alberts 08/28/2013 4:34 PM

## 2013-08-28 NOTE — Progress Notes (Signed)
    Day 10 of stay      Patient name: Gerald Rosario  Medical record number: [id number]  Date of birth: 1945-05-30   Seen patient on morning rounds with the entire team. He knows the plan for the day, and appears slightly anxious for the surgery but handling it well. He says he is improving with respect to pain and breathing.  Filed Vitals:   08/27/13 2342 08/28/13 0100 08/28/13 0642 08/28/13 0939  BP: 129/85  159/86 143/120  Pulse: 93  99 115  Temp: 97.9 F (36.6 C)  98.2 F (36.8 C) 98.5 F (36.9 C)  TempSrc: Oral  Oral Oral  Resp: 18  20 18   Height:      Weight:  178 lb 2.1 oz (80.8 kg)    SpO2: 99%  100% 99%     Physical Exam   General: Resting in bed. No acute distress.  HEENT: PERRL, EOMI, no scleral icterus. Heart: RRR, no rubs, murmurs or gallops. Lungs: Clear to auscultation bilaterally, moving air, no wheezes, rales, or rhonchi. Abdomen: Soft, nontender, nondistended, BS present. Extremities: Warm, mild pedal edema. Neuro: Alert and oriented X3.   Recent Labs Lab 08/24/13 0317 08/25/13 0230 08/26/13 0335 08/27/13 0720 08/28/13 0223  NA 130* 129* 131* 133* 131*  K 4.9 4.6 4.9 4.5 4.5  CL 96 96 96 99 97  CO2 21 22 21 19 21   GLUCOSE 186* 177* 171* 120* 156*  BUN 28* 32* 34* 23 25*  CREATININE 1.05 1.07 1.05 0.90 0.95  CALCIUM 8.4 8.3* 8.4 8.4 8.3*  MG  --  3.0* 2.9* 2.6*  --     Recent Labs Lab 08/26/13 0335 08/27/13 0720 08/28/13 0223  HGB 8.2* 8.3* 7.9*  HCT 24.3* 25.3* 23.1*  WBC 12.5* 11.0* 11.6*  PLT 334 366 359    Assessment and Plan    The patient is scheduled for CABG and removal of thoracic clots today.  Would follow post surgery.   I have discussed the care of this patient with my IM team residents. Please see the resident note for details.  Maurissa Ambrose 08/28/2013, 11:02 AM.

## 2013-08-28 NOTE — Progress Notes (Signed)
[phone] Discussed with pt importance of mobility and IS after surgery.  Discussed sternal precautions with pt and assured him we will help him with standing through assistance and gait belt use. He stated his back was really sore and side from fall and position changes are hard for him. He can get 1250 ml on IS. Pt stated he will need rehab stay after discharge as daughter lives in [place]. Told him case manager and SW would work with him closer to discharge. Gave pt OHS care guide and RN has ordered OHS booklet. Will follow up after surgery. Graylon Good RN BSN 08/28/2013 8:51 AM

## 2013-08-28 NOTE — Progress Notes (Signed)
RT note- Aline not able to draw from upon arrival from OR. Small clot removed from sample port, new dressing applied with good draw and wave form.

## 2013-08-28 NOTE — Progress Notes (Signed)
Patient ID: Gerald Rosario, male   DOB: 06/08/1945, 68 y.o.   MRN: [id number] Noted plan for CABG and evacuation hemothorax by Dr. Roxy Manns today. Appreciate his input. Trauma will sign off. Please call us if we may be of further assistance. Georganna Skeans, MD, MPH, FACS Trauma: [phone] General Surgery: [phone]

## 2013-08-28 NOTE — Progress Notes (Signed)
SUBJECTIVE: No chest pain. Awaiting CABG and evacuation of L hemothorax today  BP 159/86  Pulse 99  Temp(Src) 98.2 F (36.8 C) (Oral)  Resp 20  Ht 5\' 7"  (1.702 m)  Wt 178 lb 2.1 oz (80.8 kg)  BMI 27.89 kg/m2  SpO2 100%  Intake/Output Summary (Last 24 hours) at 08/28/13 0740 Last data filed at 08/27/13 2341  Gross per 24 hour  Intake    720 ml  Output    800 ml  Net    -80 ml    PHYSICAL EXAM General: Well developed, well nourished, in no acute distress. Alert and oriented x 3.  Psych:  Good affect, responds appropriately Neck: No JVD. No masses noted.  Lungs: Clear bilaterally anterior  Heart: Irreg irreg with no murmurs noted. Abdomen: Bowel sounds are present. Soft, non-tender.  Extremities: No lower extremity edema. Right groin without hematoma  LABS: Basic Metabolic Panel:  Recent Labs  08/26/13 0335 08/27/13 0720 08/28/13 0223  NA 131* 133* 131*  K 4.9 4.5 4.5  CL 96 99 97  CO2 21 19 21   GLUCOSE 171* 120* 156*  BUN 34* 23 25*  CREATININE 1.05 0.90 0.95  CALCIUM 8.4 8.4 8.3*  MG 2.9* 2.6*  --    CBC:  Recent Labs  08/27/13 0720 08/28/13 0223  WBC 11.0* 11.6*  HGB 8.3* 7.9*  HCT 25.3* 23.1*  MCV 86.1 84.6  PLT 366 359    Current Meds: . aminocaproic acid (AMICAR) for [facility]   Intravenous To OR  . aspirin EC  81 mg Oral Daily  . atorvastatin  40 mg Oral q1800  . bacitracin   Topical BID  . bisacodyl  5 mg Oral Once  . cefUROXime (ZINACEF)  IV  1.5 g Intravenous To OR  . cefUROXime (ZINACEF)  IV  750 mg Intravenous To OR  . Chlorhexidine Gluconate Cloth  6 each Topical Once  . dexmedetomidine  0.1-0.7 mcg/kg/hr Intravenous To OR  . DOPamine  2-20 mcg/kg/min Intravenous To OR  . epinephrine  0.5-20 mcg/min Intravenous To OR  . feeding supplement (GLUCERNA SHAKE)  237 mL Oral BID BM  . heparin-papaverine-plasmalyte irrigation   Irrigation To OR  . heparin 30,000 units/NS 1000 mL solution for CELLSAVER   Other To OR  . insulin aspart  0-9  Units Subcutaneous TID WC  . insulin glargine  10 Units Subcutaneous QHS  . insulin (NOVOLIN-R) infusion   Intravenous To OR  . magnesium sulfate  40 mEq Other To OR  . metoprolol tartrate  12.5 mg Oral Once  . metoprolol tartrate  25 mg Oral BID  . mometasone-formoterol  2 puff Inhalation BID  . nitroGLYCERIN  2-200 mcg/min Intravenous To OR  . pantoprazole  40 mg Oral Daily  . phenylephrine (NEO-SYNEPHRINE) Adult infusion  30-200 mcg/min Intravenous To OR  . polyethylene glycol  17 g Oral Daily  . potassium chloride  80 mEq Other To OR  . sodium chloride  3 mL Intravenous Q12H  . sodium chloride  3 mL Intravenous Q12H  . sodium phosphate  1 enema Rectal Once  . traMADol  100 mg Oral 4 times per day  . vancomycin 1000 mg in NS (1000 ml) irrigation for Dr. Roxy Manns case   Irrigation To OR  . vancomycin  1,500 mg Intravenous To OR   ASSESSMENT AND PLAN:  Gerald Rosario is a 68 y.o. 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 He did have elevated positive troponins, but denied any anginal symptoms. He underwent diagnostic LHC on 08/26/13.  CAD/NSTEMI: Pt is s/p diagnostic cath 08/26/13 per Dr. Ellyn Hack. He is once again found to have multi-vessel CAD with progression of disease in the large caliber intermediate branch and the small to moderate caliber Diagonal branch, CTO of LAD and Circumflex and small RCA that is diffusely diseased. I have discussed the case this am with Dr. Ellyn Hack. With anemia and hemothorax, he is not a candidate for PCI. It is not even clear if PCI of these moderately diseased branches would provide any benefit. Given multi-vessel CAD with normal LV function, he may be best treated with CABG. CT surgery  is following his hemothorax. -- Continue ASA, statin, beta blocker.  -- Dr Roxy Manns saw yesterday and he is planned for surgery today. NOTE PER Dr. Roxy Manns. [Based upon the cath films it is difficult to know what kind of target vessels will be present within the distribution of the left anterior descending coronary artery and the right coronary artery territories, but despite this I feel the patient would likely benefit from coronary artery bypass grafting in terms of decreased risk of future cardiac events and improve long-term survival. Options at this point include proceeding with coronary artery bypass grafting and simultaneous evacuation of the left hemothorax versus proceeding with left VATS for evacuation of hemothorax with delayed coronary artery bypass grafting in the future, versus non-surgical treatment for both. Although the risks of surgery may be somewhat increased under the circumstances, I favor proceeding with simultaneous coronary artery bypass grafting and is evacuation of left hemothorax. Surgery today.  Atrial fib: Continue rate control with metoprolol and cardizem. No anticoagulation given hemothorax. Once hemothorax treated/evacuated, will begin anticoagulation and proceed with DCCV if atrial fibrillation persists.   Syncope: Etiology not clear. ILR placed by EP. Previous EP study by Caryl Comes not inducable. LV function normal.   Hemothorax: Dr Roxy Manns following. Further plans per CT surgery service.     Perry Mount PA-C  5/28/20157:40 AM Patient taken to OR prior to being seen; for CABG and evacuation of hemothorax. [name]

## 2013-08-28 NOTE — Procedures (Signed)
Extubation Procedure Note  Patient Details:   Name: Gerald Rosario DOB: 1945-09-22 MRN: [id number]   Airway Documentation:     Evaluation  O2 sats: stable throughout Complications: No apparent complications Patient did tolerate procedure well. Bilateral Breath Sounds: Clear;Diminished Suctioning: Oral Yes pt able to vocalize.   Pt extubated at this time per Rapid Wean protocol. Pt tolerated well. Pt placed on 6L [place], VS stable. Pt able to breathe around deflated cuff. Pt has adequate cough. VC 0.8L, Nif -30cmH20, IS performed 581mLx10. No complications. No stridor noted. RT will continue to monitor.   Elliot Gurney 08/28/2013, 11:53 PM

## 2013-08-28 NOTE — Progress Notes (Signed)
Patient ID: Gerald Rosario, male   DOB: 08-09-1945, 68 y.o.   MRN: [id number] EVENING ROUNDS NOTE :     [address]       [place] [address]             [phone]                 Day of Surgery Procedure(s) (LRB): CORONARY ARTERY BYPASS GRAFTING (CABG) x 3: LIMA-LAD, SVG-Ramus, SVG-Diagonal  (N/A) INTRAOPERATIVE TRANSESOPHAGEAL ECHOCARDIOGRAM (N/A) CLIPPING OF ATRIAL APPENDAGE (N/A) EVACUATION OF LEFT HEMOTHORAX (Left)  Total Length of Stay:  LOS: 10 days  BP 107/69  Pulse 100  Temp(Src) 96.3 F (35.7 C) (Oral)  Resp 12  Ht 5\' 7"  (1.702 m)  Wt 178 lb 2.1 oz (80.8 kg)  BMI 27.89 kg/m2  SpO2 97%  .Intake/Output     05/28 0701 - 05/29 0700   P.O.    I.V. (mL/kg) 2593.2 (32.1)   Blood 280   IV Piggyback 1000   Total Intake(mL/kg) 3873.2 (47.9)   Urine (mL/kg/hr) 1190 (1.1)   Blood 1625 (1.5)   Chest Tube 90 (0.1)   Total Output 2905   Net +968.2         . sodium chloride 20 mL/hr at 08/28/13 1900  . sodium chloride 20 mL/hr at 08/28/13 1900  . sodium chloride 250 mL (08/28/13 1900)  . amiodarone 30 mg/hr (08/28/13 1900)  . dexmedetomidine 0.5 mcg/kg/hr (08/28/13 1936)  . DOPamine 2.97 mcg/kg/min (08/28/13 1900)  . insulin (NOVOLIN-R) infusion 2 mL/hr at 08/28/13 1900  . lactated ringers 10 mL/hr at 08/28/13 1900  . milrinone 0.375 mcg/kg/min (08/28/13 1900)  . nitroGLYCERIN Stopped (08/28/13 1758)  . phenylephrine (NEO-SYNEPHRINE) Adult infusion 5.067 mcg/min (08/28/13 1900)     Lab Results  Component Value Date   WBC 22.3* 08/28/2013   HGB 9.2* 08/28/2013   HCT 27.0* 08/28/2013   PLT 275 08/28/2013   GLUCOSE 97 08/28/2013   CHOL 165 08/20/2013   TRIG 113 08/20/2013   HDL 63 08/20/2013   LDLDIRECT 132.0 12/09/2010   LDLCALC 79 08/20/2013   ALT 17 08/18/2013   AST 32 08/18/2013   NA 135* 08/28/2013   K 4.2 08/28/2013   CL 97 08/28/2013   CREATININE 0.95 08/28/2013   BUN 25* 08/28/2013   CO2 21 08/28/2013   TSH 2.310 08/23/2013   INR 1.99*  08/28/2013   HGBA1C 6.7* 08/28/2013   Still sedated on vent hct ok Not bleeding In afib controlled rate  Grace Isaac MD  Beeper [phone] Office [phone] 08/28/2013 8:03 PM

## 2013-08-28 NOTE — Transfer of Care (Signed)
Immediate Anesthesia Transfer of Care Note  Patient: Gerald Rosario  Procedure(s) Performed: Procedure(s): CORONARY ARTERY BYPASS GRAFTING (CABG) x 3: LIMA-LAD, SVG-Ramus, SVG-Diagonal  (N/A) INTRAOPERATIVE TRANSESOPHAGEAL ECHOCARDIOGRAM (N/A) CLIPPING OF ATRIAL APPENDAGE (N/A) EVACUATION OF LEFT HEMOTHORAX (Left)  Patient Location: SICU  Anesthesia Type:General  Level of Consciousness: sedated and Patient remains intubated per anesthesia plan  Airway & Oxygen Therapy: Patient remains intubated per anesthesia plan and Patient placed on Ventilator (see vital sign flow sheet for setting)  Post-op Assessment: Report given to PACU RN and Post -op Vital signs reviewed and stable  Post vital signs: Reviewed and stable  Complications: No apparent anesthesia complications

## 2013-08-28 NOTE — Progress Notes (Signed)
I have seen the patient and reviewed the daily progress note by the medical student and discussed the care of the patient with them.  See my progress note for documentation of my findings, assessment, and plans.  

## 2013-08-29 ENCOUNTER — Inpatient Hospital Stay (HOSPITAL_COMMUNITY): Payer: Medicare Other

## 2013-08-29 DIAGNOSIS — I25810 Atherosclerosis of coronary artery bypass graft(s) without angina pectoris: Secondary | ICD-10-CM

## 2013-08-29 LAB — CBC
HCT: 23.6 % — ABNORMAL LOW (ref 39.0–52.0)
HCT: 24.5 % — ABNORMAL LOW (ref 39.0–52.0)
Hemoglobin: 8.2 g/dL — ABNORMAL LOW (ref 13.0–17.0)
Hemoglobin: 8.3 g/dL — ABNORMAL LOW (ref 13.0–17.0)
MCH: 28.7 pg (ref 26.0–34.0)
MCH: 28.4 pg (ref 26.0–34.0)
MCHC: 34.7 g/dL (ref 30.0–36.0)
MCHC: 33.9 g/dL (ref 30.0–36.0)
MCV: 82.5 fL (ref 78.0–100.0)
MCV: 83.9 fL (ref 78.0–100.0)
Platelets: 262 10*3/uL (ref 150–400)
Platelets: 310 10*3/uL (ref 150–400)
RBC: 2.86 MIL/uL — ABNORMAL LOW (ref 4.22–5.81)
RBC: 2.92 MIL/uL — ABNORMAL LOW (ref 4.22–5.81)
RDW: 15.1 % (ref 11.5–15.5)
RDW: 15.5 % (ref 11.5–15.5)
WBC: 18.1 10*3/uL — ABNORMAL HIGH (ref 4.0–10.5)
WBC: 21.6 10*3/uL — ABNORMAL HIGH (ref 4.0–10.5)

## 2013-08-29 LAB — GLUCOSE, CAPILLARY
Glucose-Capillary: 101 mg/dL — ABNORMAL HIGH (ref 70–99)
Glucose-Capillary: 103 mg/dL — ABNORMAL HIGH (ref 70–99)
Glucose-Capillary: 107 mg/dL — ABNORMAL HIGH (ref 70–99)
Glucose-Capillary: 109 mg/dL — ABNORMAL HIGH (ref 70–99)
Glucose-Capillary: 109 mg/dL — ABNORMAL HIGH (ref 70–99)
Glucose-Capillary: 109 mg/dL — ABNORMAL HIGH (ref 70–99)
Glucose-Capillary: 110 mg/dL — ABNORMAL HIGH (ref 70–99)
Glucose-Capillary: 113 mg/dL — ABNORMAL HIGH (ref 70–99)
Glucose-Capillary: 114 mg/dL — ABNORMAL HIGH (ref 70–99)
Glucose-Capillary: 116 mg/dL — ABNORMAL HIGH (ref 70–99)
Glucose-Capillary: 116 mg/dL — ABNORMAL HIGH (ref 70–99)
Glucose-Capillary: 118 mg/dL — ABNORMAL HIGH (ref 70–99)
Glucose-Capillary: 119 mg/dL — ABNORMAL HIGH (ref 70–99)
Glucose-Capillary: 122 mg/dL — ABNORMAL HIGH (ref 70–99)
Glucose-Capillary: 122 mg/dL — ABNORMAL HIGH (ref 70–99)
Glucose-Capillary: 122 mg/dL — ABNORMAL HIGH (ref 70–99)
Glucose-Capillary: 129 mg/dL — ABNORMAL HIGH (ref 70–99)
Glucose-Capillary: 131 mg/dL — ABNORMAL HIGH (ref 70–99)
Glucose-Capillary: 133 mg/dL — ABNORMAL HIGH (ref 70–99)
Glucose-Capillary: 137 mg/dL — ABNORMAL HIGH (ref 70–99)
Glucose-Capillary: 77 mg/dL (ref 70–99)
Glucose-Capillary: 86 mg/dL (ref 70–99)

## 2013-08-29 LAB — MAGNESIUM
Magnesium: 3.1 mg/dL — ABNORMAL HIGH (ref 1.5–2.5)
Magnesium: 2.9 mg/dL — ABNORMAL HIGH (ref 1.5–2.5)
Magnesium: 2.9 mg/dL — ABNORMAL HIGH (ref 1.5–2.5)

## 2013-08-29 LAB — POCT I-STAT, CHEM 8
BUN: 23 mg/dL (ref 6–23)
Calcium, Ion: 1.15 mmol/L (ref 1.13–1.30)
Chloride: 101 mEq/L (ref 96–112)
Creatinine, Ser: 1.7 mg/dL — ABNORMAL HIGH (ref 0.50–1.35)
Glucose, Bld: 138 mg/dL — ABNORMAL HIGH (ref 70–99)
HCT: 27 % — ABNORMAL LOW (ref 39.0–52.0)
Hemoglobin: 9.2 g/dL — ABNORMAL LOW (ref 13.0–17.0)
Potassium: 4.7 mEq/L (ref 3.7–5.3)
Sodium: 133 mEq/L — ABNORMAL LOW (ref 137–147)
TCO2: 20 mmol/L (ref 0–100)

## 2013-08-29 LAB — BASIC METABOLIC PANEL
BUN: 21 mg/dL (ref 6–23)
CO2: 19 mEq/L (ref 19–32)
Calcium: 7.5 mg/dL — ABNORMAL LOW (ref 8.4–10.5)
Chloride: 104 mEq/L (ref 96–112)
Creatinine, Ser: 0.95 mg/dL (ref 0.50–1.35)
GFR calc Af Amer: >90 mL/min (ref >90)
GFR calc non Af Amer: 84 mL/min — ABNORMAL LOW (ref >90)
Glucose, Bld: 119 mg/dL — ABNORMAL HIGH (ref 70–99)
Potassium: 4.7 mEq/L (ref 3.7–5.3)
Sodium: 134 mEq/L — ABNORMAL LOW (ref 137–147)

## 2013-08-29 LAB — POCT I-STAT 3, ART BLOOD GAS (G3+)
Acid-base deficit: 4 mmol/L — ABNORMAL HIGH (ref 0.0–2.0)
Bicarbonate: 19.8 mEq/L — ABNORMAL LOW (ref 20.0–24.0)
O2 Saturation: 92 %
Patient temperature: 37.2
TCO2: 21 mmol/L (ref 0–100)
pCO2 arterial: 32.5 mmHg — ABNORMAL LOW (ref 35.0–45.0)
pH, Arterial: 7.395 (ref 7.350–7.450)
pO2, Arterial: 64 mmHg — ABNORMAL LOW (ref 80.0–100.0)

## 2013-08-29 LAB — CREATININE, SERUM
Creatinine, Ser: 0.9 mg/dL (ref 0.50–1.35)
Creatinine, Ser: 1.56 mg/dL — ABNORMAL HIGH (ref 0.50–1.35)
GFR calc Af Amer: >90 mL/min (ref >90)
GFR calc Af Amer: 51 mL/min — ABNORMAL LOW (ref >90)
GFR calc non Af Amer: 85 mL/min — ABNORMAL LOW (ref >90)
GFR calc non Af Amer: 44 mL/min — ABNORMAL LOW (ref >90)

## 2013-08-29 MED ORDER — FUROSEMIDE 10 MG/ML IJ SOLN
20.0000 mg | Freq: Four times a day (QID) | INTRAMUSCULAR | Status: AC
  Administered 2013-08-29 (×3): 20 mg via INTRAVENOUS
  Filled 2013-08-29 (×3): qty 2

## 2013-08-29 MED ORDER — INSULIN ASPART 100 UNIT/ML ~~LOC~~ SOLN
0.0000 [IU] | SUBCUTANEOUS | Status: DC
  Administered 2013-08-29 – 2013-08-30 (×4): 2 [IU] via SUBCUTANEOUS

## 2013-08-29 MED ORDER — MILRINONE IN DEXTROSE 20 MG/100ML IV SOLN
0.2000 ug/kg/min | INTRAVENOUS | Status: DC
  Administered 2013-08-29: 0.2 ug/kg/min via INTRAVENOUS
  Filled 2013-08-29: qty 100

## 2013-08-29 MED ORDER — INSULIN DETEMIR 100 UNIT/ML ~~LOC~~ SOLN
20.0000 [IU] | Freq: Every day | SUBCUTANEOUS | Status: DC
  Administered 2013-08-29 – 2013-08-31 (×3): 20 [IU] via SUBCUTANEOUS
  Filled 2013-08-29 (×4): qty 0.2

## 2013-08-29 MED ORDER — PANTOPRAZOLE SODIUM 40 MG PO TBEC
40.0000 mg | DELAYED_RELEASE_TABLET | Freq: Every day | ORAL | Status: DC
  Administered 2013-08-29 – 2013-09-08 (×11): 40 mg via ORAL
  Filled 2013-08-29 (×11): qty 1

## 2013-08-29 MED ORDER — MOMETASONE FURO-FORMOTEROL FUM 100-5 MCG/ACT IN AERO
2.0000 | INHALATION_SPRAY | Freq: Two times a day (BID) | RESPIRATORY_TRACT | Status: DC
  Administered 2013-08-29 – 2013-09-08 (×21): 2 via RESPIRATORY_TRACT
  Filled 2013-08-29: qty 8.8

## 2013-08-29 MED ORDER — DOPAMINE-DEXTROSE 3.2-5 MG/ML-% IV SOLN
3.0000 ug/kg/min | INTRAVENOUS | Status: DC
  Administered 2013-08-29 – 2013-08-31 (×2): 3 ug/kg/min via INTRAVENOUS
  Filled 2013-08-29: qty 250

## 2013-08-29 MED ORDER — SIMVASTATIN 20 MG PO TABS
20.0000 mg | ORAL_TABLET | Freq: Every day | ORAL | Status: DC
  Administered 2013-08-29 – 2013-09-07 (×10): 20 mg via ORAL
  Filled 2013-08-29 (×11): qty 1

## 2013-08-29 MED ORDER — METOPROLOL TARTRATE 12.5 MG HALF TABLET
12.5000 mg | ORAL_TABLET | Freq: Two times a day (BID) | ORAL | Status: DC
  Administered 2013-08-30 – 2013-09-05 (×13): 12.5 mg via ORAL
  Filled 2013-08-29 (×17): qty 1

## 2013-08-29 MED ORDER — MORPHINE SULFATE 2 MG/ML IJ SOLN
2.0000 mg | INTRAMUSCULAR | Status: DC | PRN
  Administered 2013-08-29 – 2013-09-01 (×12): 2 mg via INTRAVENOUS
  Filled 2013-08-29 (×11): qty 1

## 2013-08-29 MED ORDER — TRAMADOL HCL 50 MG PO TABS
50.0000 mg | ORAL_TABLET | Freq: Four times a day (QID) | ORAL | Status: DC | PRN
  Administered 2013-09-01 – 2013-09-07 (×9): 50 mg via ORAL
  Filled 2013-08-29 (×9): qty 1

## 2013-08-29 MED ORDER — METOPROLOL TARTRATE 25 MG PO TABS: 25.0000 mg | ORAL_TABLET | Freq: Two times a day (BID) | ORAL | Status: DC

## 2013-08-29 MED ORDER — METOPROLOL TARTRATE 25 MG PO TABS
37.5000 mg | ORAL_TABLET | Freq: Two times a day (BID) | ORAL | Status: DC
  Administered 2013-08-29: 37.5 mg via ORAL
  Filled 2013-08-29 (×2): qty 1

## 2013-08-29 MED FILL — Heparin Sodium (Porcine) Inj 1000 Unit/ML: INTRAMUSCULAR | Qty: 10 | Status: AC

## 2013-08-29 MED FILL — Mannitol IV Soln 20%: INTRAVENOUS | Qty: 500 | Status: AC

## 2013-08-29 MED FILL — Potassium Chloride Inj 2 mEq/ML: INTRAVENOUS | Qty: 40 | Status: AC

## 2013-08-29 MED FILL — Dexmedetomidine HCl IV Soln 200 MCG/2ML: INTRAVENOUS | Qty: 2 | Status: AC

## 2013-08-29 MED FILL — Sodium Bicarbonate IV Soln 8.4%: INTRAVENOUS | Qty: 50 | Status: AC

## 2013-08-29 MED FILL — Lidocaine HCl IV Inj 20 MG/ML: INTRAVENOUS | Qty: 5 | Status: AC

## 2013-08-29 MED FILL — Heparin Sodium (Porcine) Inj 1000 Unit/ML: INTRAMUSCULAR | Qty: 30 | Status: AC

## 2013-08-29 MED FILL — Magnesium Sulfate Inj 50%: INTRAMUSCULAR | Qty: 10 | Status: AC

## 2013-08-29 MED FILL — Electrolyte-R (PH 7.4) Solution: INTRAVENOUS | Qty: 3000 | Status: AC

## 2013-08-29 MED FILL — Sodium Chloride IV Soln 0.9%: INTRAVENOUS | Qty: 3000 | Status: AC

## 2013-08-29 NOTE — Progress Notes (Signed)
Subjective:Patient is post CABG day 1, lying in his bed comfortably,feeling better. Mild chest pain. Denies SOB, palpitation, N/V or abd. Pain. Objective: Vital signs in last 24 hours: Filed Vitals:   08/29/13 0645 08/29/13 0700 08/29/13 0715 08/29/13 1115  BP:  78/14 111/65   Pulse: 106 107 105   Temp: 98.4 F (36.9 C) 98.2 F (36.8 C) 98.4 F (36.9 C) 97.5 F (36.4 C)  TempSrc:      Resp: 12 23 12    Height:      Weight:      SpO2: 100% 99% 99%    Weight change: 9 kg (19 lb 13.5 oz)  Intake/Output Summary (Last 24 hours) at 08/29/13 1139 Last data filed at 08/29/13 0900  Gross per 24 hour  Intake 6407.05 ml  Output   3914 ml  Net 2493.05 ml  EXAM:  Alert, mildly drowsy, comfortable. HEENT: AT/[place]/ PERRL, EOMI NECK:Had a central line placed on his R neck. No adenopathy. LUNGS: 2 L CT placed, Clear B/L, with some diminished sound at LL base. HEART: tachycardic, RRR. ABD: S/ND/NT. BS + EXT: No pedal edema, 2+ edema of his hands B/L.  Lab Results: @LABTEST2 @ Micro Results: Recent Results (from the past 240 hour(s))  SURGICAL PCR SCREEN     Status: None   Collection Time    08/27/13  9:47 PM      Result Value Ref Range Status   MRSA, PCR NEGATIVE  NEGATIVE Final   Staphylococcus aureus NEGATIVE  NEGATIVE Final   Comment:            The Xpert SA Assay (FDA     approved for NASAL specimens     in patients over 67 years of age),     is one component of     a comprehensive surveillance     program.  Test performance has     been validated by [employer] for patients greater     than or equal to 52 year old.     It is not intended     to diagnose infection nor to     guide or monitor treatment.   Studies/Results: Dg Chest Portable 1 View In Am  08/29/2013   CLINICAL DATA:  Post CABG.  EXAM: PORTABLE CHEST - 1 VIEW  COMPARISON:  08/28/2013  FINDINGS: The endotracheal tube and nasogastric tube have been removed. The left chest tubes are still present. Again  noted are left rib fractures. Swan-Ganz catheter tip in the right pulmonary artery. Left atrial appendage ligation clip is noted. Hazy interstitial densities in the left lung are unchanged without a large pneumothorax. Stable densities at the left lung base. Slightly prominent interstitial densities in the right lung without focal disease. Heart size is stable.  IMPRESSION: Stable interstitial densities in the left lung without a pneumothorax. Left chest tubes are still present.  Support apparatuses as described.   Electronically Signed   By: Markus Daft M.D.   On: 08/29/2013 08:04   Dg Chest Portable 1 View  08/28/2013   CLINICAL DATA:  Postop CABG  EXAM: PORTABLE CHEST - 1 VIEW  COMPARISON:  Prior chest x-ray 08/27/2013  FINDINGS: Interval surgical changes of median sternotomy with evidence of multivessel CABG including LIMA bypass and left atrial appendage clip ligation. The patient is now intubated. The tip of the ET tube is 3.4 cm above the carina. A right IJ vascular sheath conveys a Swan-Ganz catheter into the heart. The tip of  the Swan overlies the right proximal pulmonary artery. A nasogastric tube is in place, the tip is within the stomach. Is there are now 2 right-sided thoracostomy tubes and a probable mediastinal drain. The implantable loop recorder remains in unchanged position. Cardiac and mediastinal contours are otherwise unchanged. Atherosclerotic calcifications are present in the transverse aorta. Left pleural effusion with apical capping. No definite pneumothorax. Persistent atelectasis in the left lung. No pulmonary edema. Left-sided rib fractures.  IMPRESSION: 1. The tip of the endotracheal tube is 3.4 cm above the carina. 2. The tip of the Swan-Ganz catheter overlies the right main pulmonary artery. 3. The tip of the nasogastric tube is likely within the stomach. 4. Left pleural effusion with apical capping and chest tubes in place. No definite pneumothorax. 5. Low inspiratory volumes with  left greater than right atelectasis. Negative for pulmonary edema.   Electronically Signed   By: Jacqulynn Cadet M.D.   On: 08/28/2013 18:04   Medications: medication reviewed Scheduled Meds: . acetaminophen  1,000 mg Oral 4 times per day  . aspirin EC  325 mg Oral Daily  . bisacodyl  10 mg Oral Daily   Or  . bisacodyl  10 mg Rectal Daily  . cefUROXime (ZINACEF)  IV  1.5 g Intravenous Q12H  . docusate sodium  200 mg Oral Daily  . famotidine (PEPCID) IV  20 mg Intravenous Q12H  . furosemide  20 mg Intravenous Q6H  . insulin aspart  0-24 Units Subcutaneous 6 times per day  . insulin detemir  20 Units Subcutaneous Daily  . insulin regular  0-10 Units Intravenous TID WC  . metoprolol tartrate  37.5 mg Oral BID  . mometasone-formoterol  2 puff Inhalation BID  . pantoprazole  40 mg Oral Daily  . simvastatin  20 mg Oral q1800  . sodium chloride  3 mL Intravenous Q12H   Continuous Infusions: . amiodarone 30 mg/hr (08/29/13 0211)  . insulin (NOVOLIN-R) infusion 1.1 Units/hr (08/29/13 0900)  . milrinone 0.25 mcg/kg/min (08/29/13 1000)  . nitroGLYCERIN Stopped (08/29/13 1000)   PRN Meds:.albumin human, metoprolol, morphine injection, ondansetron (ZOFRAN) IV, oxyCODONE, sodium chloride, traMADol Assessment/Plan: POST CABG: Post op day 1,extubated, feeling better, mildly drowsy, He is hemodynamically stable, still little tachycardia with HR of 105.CT and cardiology following.  HEMOTHORAX: Had VATS yesterday, His recent CXR shows stable interstitial densities in L lung with 2 CT in place. NO pneumothorax. They removed a big clot and irrigated the pleural cavity yesterday. CT is following.Looks stable. AFIB: He has clipping of atrial appendage yesterday, his recent EKG shows sinus tachycardia with some premature supraventricular complexes, asymptomatic, cardiology is following. SYNCOPE: No new episode, feeling better. Already have his LINQ recorder.Continue monitoring.  RIB FRACTURE: His pain  has improved , he is on morphin 2mg  Q1H due to his post op. status .Continue with pain management and restart PT/Op once CT advice.  BACK LACERATION:Continue with wound care.  HTN: His BP was 111/65 today.CT will advice about his post op. meds.  DM: His CBG looks good today with 119 this morning. Continue with novo lox and lantus.  URINARY RETENTION :Foley's was reinserted yesterday, Repeat UA .    This is a Careers information officer Note.  The care of the patient was discussed with Dr. Gordy Levan and the assessment and plan formulated with their assistance.  Please see their attached note for official documentation of the daily encounter.   LOS: 11 days   Lorella Nimrod, Med Student 08/29/2013, 11:39 AM

## 2013-08-29 NOTE — Progress Notes (Signed)
     SUBJECTIVE: No dyspnea; chest sore  BP 111/65  Pulse 105  Temp(Src) 98.4 F (36.9 C) (Core (Comment))  Resp 12  Ht 5\' 7"  (1.702 m)  Wt 197 lb 15.6 oz (89.8 kg)  BMI 31.00 kg/m2  SpO2 99%  Intake/Output Summary (Last 24 hours) at 08/29/13 7494 Last data filed at 08/29/13 0700  Gross per 24 hour  Intake   6164 ml  Output   3835 ml  Net   2329 ml    PHYSICAL EXAM General: Well developed, well nourished, in no acute distress. Alert and oriented x 3.  Neck: Supple Lungs: Diminished BS LLL Heart: RRR Abdomen: Soft, NT/ND Extremities: No edema  LABS: Basic Metabolic Panel:  Recent Labs  08/28/13 0223  08/28/13 2310 08/28/13 2333 08/29/13 0405  NA 131*  < >  --  135* 134*  K 4.5  < >  --  4.4 4.7  CL 97  --   --  102 104  CO2 21  --   --   --  19  GLUCOSE 156*  < >  --  108* 119*  BUN 25*  --   --  22 21  CREATININE 0.95  --  0.90 0.90 0.95  CALCIUM 8.3*  --   --   --  7.5*  MG  --   --  3.1*  --  2.9*  < > = values in this interval not displayed. CBC:  Recent Labs  08/28/13 2310 08/28/13 2333 08/29/13 0405  WBC 18.0*  --  18.1*  HGB 8.5* 8.2* 8.2*  HCT 24.5* 24.0* 23.6*  MCV 83.1  --  82.5  PLT 250  --  262    Current Meds: . acetaminophen  1,000 mg Oral 4 times per day   Or  . acetaminophen (TYLENOL) oral liquid 160 mg/5 mL  1,000 mg Per Tube 4 times per day  . aspirin EC  325 mg Oral Daily   Or  . aspirin  324 mg Per Tube Daily  . bisacodyl  10 mg Oral Daily   Or  . bisacodyl  10 mg Rectal Daily  . cefUROXime (ZINACEF)  IV  1.5 g Intravenous Q12H  . docusate sodium  200 mg Oral Daily  . famotidine (PEPCID) IV  20 mg Intravenous Q12H  . insulin regular  0-10 Units Intravenous TID WC  . metoprolol tartrate  12.5 mg Oral BID   Or  . metoprolol tartrate  12.5 mg Per Tube BID  . [START ON 08/30/2013] pantoprazole  40 mg Oral Daily  . sodium chloride  3 mL Intravenous Q12H   ASSESSMENT AND PLAN: 1 CAD-Patient doing well s/p CABG; Continue  ASA, low dose metoprolol; resume statin. Wean pressors as tolerated. 2 Expected post operative blood loss anemia 3 Atrial fibrillation-s/p Left atrial clip. In sinus this AM. Continue lopressor and amiodarone (convert to po). No anticoagulation given recent hemothorax. 4 Hemothorax-s/p evacuation 5 Syncope-ILR in place   Katharina Caper, [facility]

## 2013-08-29 NOTE — Progress Notes (Addendum)
[address]       [place] [address]             [phone]        CARDIOTHORACIC SURGERY PROGRESS NOTE   R1 Day Post-Op Procedure(s) (LRB): CORONARY ARTERY BYPASS GRAFTING (CABG) x 3: LIMA-LAD, SVG-Ramus, SVG-Diagonal  (N/A) INTRAOPERATIVE TRANSESOPHAGEAL ECHOCARDIOGRAM (N/A) CLIPPING OF ATRIAL APPENDAGE (N/A) EVACUATION OF LEFT HEMOTHORAX (Left)  Subjective: Looks good.  Mild soreness in chest.  Objective: Vital signs: BP Readings from Last 1 Encounters:  08/29/13 111/65   Pulse Readings from Last 1 Encounters:  08/29/13 105   Resp Readings from Last 1 Encounters:  08/29/13 12   Temp Readings from Last 1 Encounters:  08/29/13 98.4 F (36.9 C)     Hemodynamics: PAP: (38-58)/(20-36) 41/25 mmHg CO:  [2.5 L/min-5.4 L/min] 5.4 L/min CI:  [1.3 L/min/m2-2.8 L/min/m2] 2.8 L/min/m2  Physical Exam:  Rhythm:   Sinus tach  Breath sounds: clear  Heart sounds:  RRR  Incisions:  Dressing dry, intact  Abdomen:  Soft, non-distended, non-tender  Extremities:  Warm, well-perfused    Intake/Output from previous day: 05/28 0701 - 05/29 0700 In: 6164 [P.O.:200; I.V.:4384; Blood:280; IV Piggyback:1300] Out: 6270 [Urine:1770; Blood:1625; Chest Tube:440] Intake/Output this shift: Total I/O In: 109 [I.V.:59; IV Piggyback:50] Out: 79 [Urine:35; Chest Tube:44]  Lab Results:  CBC: Recent Labs  08/28/13 2310 08/28/13 2333 08/29/13 0405  WBC 18.0*  --  18.1*  HGB 8.5* 8.2* 8.2*  HCT 24.5* 24.0* 23.6*  PLT 250  --  262    BMET:  Recent Labs  08/28/13 0223  08/28/13 2333 08/29/13 0405  NA 131*  < > 135* 134*  K 4.5  < > 4.4 4.7  CL 97  --  102 104  CO2 21  --   --  19  GLUCOSE 156*  < > 108* 119*  BUN 25*  --  22 21  CREATININE 0.95  < > 0.90 0.95  CALCIUM 8.3*  --   --  7.5*  < > = values in this interval not displayed.   CBG (last 3)   Recent Labs  08/29/13 0508 08/29/13 0602 08/29/13 0659  GLUCAP 131* 119* 122*    ABG      Component Value Date/Time   PHART 7.395 08/29/2013 0049   PCO2ART 32.5* 08/29/2013 0049   PO2ART 64.0* 08/29/2013 0049   HCO3 19.8* 08/29/2013 0049   TCO2 21 08/29/2013 0049   ACIDBASEDEF 4.0* 08/29/2013 0049   O2SAT 92.0 08/29/2013 0049    CXR: Stable mild residual LLL atelectasis and left lung opacity  Assessment/Plan: S/P Procedure(s) (LRB): CORONARY ARTERY BYPASS GRAFTING (CABG) x 3: LIMA-LAD, SVG-Ramus, SVG-Diagonal  (N/A) INTRAOPERATIVE TRANSESOPHAGEAL ECHOCARDIOGRAM (N/A) CLIPPING OF ATRIAL APPENDAGE (N/A) EVACUATION OF LEFT HEMOTHORAX (Left)  Overall doing well POD1 Maintaining NSR - sinus tach w/ stable hemodynamics on IV milrinone and NTG, relatively hypertensive Pre-op atrial fibrillation, currently maintaining NSR on IV amiodarone Expected post op acute blood loss anemia with pre-op anemia, stable Expected post op volume excess, with pre-op volume overload Expected post op atelectasis with pre-op atelectasis due to left sided rib fractures Multiple rib fractures on left side, s/p evacuation of left hemothorax Type II diabetes mellitus, excellent glycemic control on insulin drip   Mobilize  Diuresis  Wean milrinone and NTG drips  Increase metoprolol  Continue amiodarone IV today and convert to oral once oral intake improves  Pulm toilet  D/C mediastinal tubes but leave left  pleural tubes for now  Not a candidate for pharmacologic anticoagulation at present due to left hemothorax and risk of re-bleeding  Add levemir insulin and wean drip  Resume oral agent in 1-2 days depending upon oral intake    Gerald Rosario 08/29/2013 8:25 AM

## 2013-08-29 NOTE — Progress Notes (Signed)
Subjective:   Day of hospitalization: 11  VSS. S/p CABG and VATS yesterday.  He is feeling OK today with more pain than previously-very optimistic.  Leukocytosis.   Objective:   Vital signs in last 24 hours: Filed Vitals:   08/29/13 0900 08/29/13 1000 08/29/13 1100 08/29/13 1115  BP: 115/52 124/65 107/58   Pulse: 109 107 100   Temp: 97.5 F (36.4 C) 97.7 F (36.5 C) 97.7 F (36.5 C) 97.5 F (36.4 C)  TempSrc:      Resp: 28 19 10    Height:      Weight:      SpO2: 100% 99% 100%     Weight: Filed Weights   08/27/13 0300 08/28/13 0100 08/29/13 0500  Weight: 185 lb 3 oz (84 kg) 178 lb 2.1 oz (80.8 kg) 197 lb 15.6 oz (89.8 kg)    Ins/Outs:  Intake/Output Summary (Last 24 hours) at 08/29/13 1149 Last data filed at 08/29/13 1100  Gross per 24 hour  Intake 6453.75 ml  Output   4011 ml  Net 2442.75 ml    Physical Exam: Constitutional: Vital signs reviewed.  Patient lying in bed in NAD.  HEENT: [place]/AT; PERRL, EOMI, conjunctivae normal, no scleral icterus  Cardiovascular: RRR, no MRG Pulmonary/Chest: CTAB without rales, wheezes, or rhonchi Abdominal: Soft. Mildly distended, +BS, NT/ND Neurological: A&O x3, CN II-XII grossly intact; non-focal exam Musculoskeletal: 15cm laceration R upper back at approximately the T3 level.   Abrasions noted left mid thoracic and back region with TTP. Extremities: 2+DP b/l, no C/C Skin: Warm, dry.    Lab Results:  BMP:  Recent Labs Lab 08/28/13 0223  08/28/13 2310 08/28/13 2333 08/29/13 0405  NA 131*  < >  --  135* 134*  K 4.5  < >  --  4.4 4.7  CL 97  --   --  102 104  CO2 21  --   --   --  19  GLUCOSE 156*  < >  --  108* 119*  BUN 25*  --   --  22 21  CREATININE 0.95  --  0.90 0.90 0.95  CALCIUM 8.3*  --   --   --  7.5*  MG  --   --  3.1*  --  2.9*  < > = values in this interval not displayed. Anion Gap:  11  CBC:  Recent Labs Lab 08/23/13 0315  08/28/13 2310 08/28/13 2333 08/29/13 0405  WBC 11.3*  < > 18.0*   --  18.1*  NEUTROABS 9.0*  --   --   --   --   HGB 8.2*  < > 8.5* 8.2* 8.2*  HCT 23.5*  < > 24.5* 24.0* 23.6*  MCV 85.5  < > 83.1  --  82.5  PLT 246  < > 250  --  262  < > = values in this interval not displayed.  Coagulation:  Recent Labs Lab 08/28/13 0223 08/28/13 1700  LABPROT 16.5* 22.0*  INR 1.37 1.99*    CBG:            Recent Labs Lab 08/29/13 0254 08/29/13 0356 08/29/13 0508 08/29/13 0602 08/29/13 0659 08/29/13 0804  GLUCAP 77 122* 131* 119* 122* 103*           HA1C:       Recent Labs Lab 08/28/13 0223  HGBA1C 6.7*    Lipid Panel: No results found for this basename: CHOL, HDL, LDLCALC, TRIG, CHOLHDL, LDLDIRECT,  in the last 168  hours  LFTs: No results found for this basename: AST, ALT, ALKPHOS, BILITOT, PROT, ALBUMIN,  in the last 168 hours  Pancreatic Enzymes: No results found for this basename: LIPASE, AMYLASE,  in the last 168 hours  Lactic Acid/Procalcitonin: No results found for this basename: LATICACIDVEN, PROCALCITON,  in the last 168 hours  Ammonia: No results found for this basename: AMMONIA,  in the last 168 hours  Cardiac Enzymes: No results found for this basename: CKTOTAL, CKMB, CKMBINDEX, TROPONINI,  in the last 168 hours  EKG: EKG Interpretation  Date/Time:  Monday Aug 18 2013 04:59:50 EDT Ventricular Rate:  78 PR Interval:  169 QRS Duration: 107 QT Interval:  394 QTC Calculation: 449 R Axis:   6 Text Interpretation:  Sinus rhythm Anterior infarct, old ED PHYSICIAN INTERPRETATION AVAILABLE IN [facility] Confirmed by TEST, Record ([id number]) on 08/20/2013 7:15:39 AM   BNP: No results found for this basename: PROBNP,  in the last 168 hours  D-Dimer: No results found for this basename: DDIMER,  in the last 168 hours  Urinalysis:  Recent Labs Lab 08/27/13 2145  COLORURINE YELLOW  LABSPEC 1.020  PHURINE 5.0  GLUCOSEU NEGATIVE  HGBUR SMALL*  BILIRUBINUR NEGATIVE  KETONESUR 15*  PROTEINUR NEGATIVE  UROBILINOGEN  1.0  NITRITE NEGATIVE  LEUKOCYTESUR SMALL*    Micro Results: Recent Results (from the past 240 hour(s))  SURGICAL PCR SCREEN     Status: None   Collection Time    08/27/13  9:47 PM      Result Value Ref Range Status   MRSA, PCR NEGATIVE  NEGATIVE Final   Staphylococcus aureus NEGATIVE  NEGATIVE Final   Comment:            The Xpert SA Assay (FDA     approved for NASAL specimens     in patients over 39 years of age),     is one component of     a comprehensive surveillance     program.  Test performance has     been validated by [employer] for patients greater     than or equal to 12 year old.     It is not intended     to diagnose infection nor to     guide or monitor treatment.    Blood Culture: No results found for this basename: sdes,  specrequest,  cult,  reptstatus    Studies/Results: Dg Chest Portable 1 View In Am  08/29/2013   CLINICAL DATA:  Post CABG.  EXAM: PORTABLE CHEST - 1 VIEW  COMPARISON:  08/28/2013  FINDINGS: The endotracheal tube and nasogastric tube have been removed. The left chest tubes are still present. Again noted are left rib fractures. Swan-Ganz catheter tip in the right pulmonary artery. Left atrial appendage ligation clip is noted. Hazy interstitial densities in the left lung are unchanged without a large pneumothorax. Stable densities at the left lung base. Slightly prominent interstitial densities in the right lung without focal disease. Heart size is stable.  IMPRESSION: Stable interstitial densities in the left lung without a pneumothorax. Left chest tubes are still present.  Support apparatuses as described.   Electronically Signed   By: Markus Daft M.D.   On: 08/29/2013 08:04   Dg Chest Portable 1 View  08/28/2013   CLINICAL DATA:  Postop CABG  EXAM: PORTABLE CHEST - 1 VIEW  COMPARISON:  Prior chest x-ray 08/27/2013  FINDINGS: Interval surgical changes of median sternotomy with evidence of multivessel CABG  including LIMA bypass and left  atrial appendage clip ligation. The patient is now intubated. The tip of the ET tube is 3.4 cm above the carina. A right IJ vascular sheath conveys a Swan-Ganz catheter into the heart. The tip of the Swan overlies the right proximal pulmonary artery. A nasogastric tube is in place, the tip is within the stomach. Is there are now 2 right-sided thoracostomy tubes and a probable mediastinal drain. The implantable loop recorder remains in unchanged position. Cardiac and mediastinal contours are otherwise unchanged. Atherosclerotic calcifications are present in the transverse aorta. Left pleural effusion with apical capping. No definite pneumothorax. Persistent atelectasis in the left lung. No pulmonary edema. Left-sided rib fractures.  IMPRESSION: 1. The tip of the endotracheal tube is 3.4 cm above the carina. 2. The tip of the Swan-Ganz catheter overlies the right main pulmonary artery. 3. The tip of the nasogastric tube is likely within the stomach. 4. Left pleural effusion with apical capping and chest tubes in place. No definite pneumothorax. 5. Low inspiratory volumes with left greater than right atelectasis. Negative for pulmonary edema.   Electronically Signed   By: Jacqulynn Cadet M.D.   On: 08/28/2013 18:04    Medications:  Scheduled Meds: . acetaminophen  1,000 mg Oral 4 times per day  . aspirin EC  325 mg Oral Daily  . bisacodyl  10 mg Oral Daily   Or  . bisacodyl  10 mg Rectal Daily  . cefUROXime (ZINACEF)  IV  1.5 g Intravenous Q12H  . docusate sodium  200 mg Oral Daily  . famotidine (PEPCID) IV  20 mg Intravenous Q12H  . furosemide  20 mg Intravenous Q6H  . insulin aspart  0-24 Units Subcutaneous 6 times per day  . insulin detemir  20 Units Subcutaneous Daily  . insulin regular  0-10 Units Intravenous TID WC  . metoprolol tartrate  37.5 mg Oral BID  . mometasone-formoterol  2 puff Inhalation BID  . pantoprazole  40 mg Oral Daily  . simvastatin  20 mg Oral q1800  . sodium chloride  3  mL Intravenous Q12H   Continuous Infusions: . amiodarone 30 mg/hr (08/29/13 0211)  . insulin (NOVOLIN-R) infusion 1.1 Units/hr (08/29/13 0900)  . milrinone 0.15 mcg/kg/min (08/29/13 1100)  . nitroGLYCERIN Stopped (08/29/13 1000)   PRN Meds: albumin human, metoprolol, morphine injection, ondansetron (ZOFRAN) IV, oxyCODONE, sodium chloride, traMADol  Antibiotics: Antibiotics Given (last 72 hours)   Date/Time Action Medication Dose Rate   08/28/13 1220 Given   vancomycin (VANCOCIN) 1,500 mg in sodium chloride 0.9 % 250 mL IVPB 1,500 mg    08/28/13 1220 Given   cefUROXime (ZINACEF) 1.5 g in dextrose 5 % 50 mL IVPB 1.5 g    08/28/13 1310 Given  [Expires 09/10/2013 @1800 ]   vancomycin (VANCOCIN) 1,000 mg in sodium chloride 0.9 % 1,000 mL irrigation     08/28/13 1547 Given   cefUROXime (ZINACEF) 1.5 g in dextrose 5 % 50 mL IVPB 0.75 g    08/28/13 2045 Given   cefUROXime (ZINACEF) 1.5 g in dextrose 5 % 50 mL IVPB 1.5 g 100 mL/hr   08/29/13 0148 Given   vancomycin (VANCOCIN) IVPB 1000 mg/200 mL premix 1,000 mg 200 mL/hr   08/29/13 0805 Given   cefUROXime (ZINACEF) 1.5 g in dextrose 5 % 50 mL IVPB 1.5 g 100 mL/hr      Day of Hospitalization: 11  Consults: Treatment Team:  [facility], MD Rexene Alberts, MD  Assessment/Plan:   Principal  Problem:   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 and clipping of LA appendage Active Problems:   HYPERTENSION   Coronary artery disease   Ischemic cardiomyopathy   Diabetes mellitus   Hyperkalemia   Chronic systolic heart failure   Syncope   Multiple fractures of ribs of left side   Laceration of skin of scalp   Hematoma of occipital surface of head   Chronic sinusitis   Laceration of skin   NSTEMI (non-ST elevated myocardial infarction)   Pleural effusion   Urine retention   Syncope and collapse   A-fib  MVCAD s/p CABG  Pt doing well without complaints.  Optimistic.  -management per CVTS -monitor for s/s of  depression  Multiple segmental rib fractures with clotted left hemothorax s/p VATS Pt had VATS yesterday to remove clot burden.   5/24 CT chest showed moderate amt of residual clotted blood in posterior hemithorax with LLL atx.  Cath showed 80-90% occlusion.  -pain management -pulmonary toilet/IS  Acute atrial fibrillation with RVR  Stable. HR controlled.  Has been in a-fib with HR up to 140s. Cardiology consulted.  TSH wnl.  -metoprolol for rate control to 25mg  bid per cards -no heparin gtt per cards  Syncope  Stable-no further episodes this admission. Trops trended down.  EP consulted and placed implantable loop recorder.  5/19 echo: LVEF 55-60% with LA dilation but unchanged from 2013 echo.   -appreciate EP recs -monitor  NSTEMI Stable s/p CABG yesterday.  Lipid panel OK.  Known severe multivessel CAD as previously described with occluded LAD, circumflex and RCA. The proximal diagonal branch now has an 80-90% stenosis in the proximal ramus intermedius as a 60-70% stenosis prior to bifurcation. Relatively normal LVEDP. -atorvastatin to 40mg  qd  Acute on chronic normocytic anemia Pt with acute blood loss d/t multiple rib fractures.  Anemia panel revealed normal retic, ferritin wnl, iron low at 13, and TIBC low at 205.  Folate wnl.  B12 low at 287.  MMA wnl. Pt never had colonoscopy and will need as outpatient. -pt needs GI follow-up as outpatient  Upper back skin laceration 15 cm laceration at risk for skin necrosis.  Laceration repaired by trama service. -monitor -bacitracin applied bid  Left suboccipital scalp hematoma and laceration -monitor  AKI Resolved.  Baseline is 1.1-1.2.  He was noted to have mild elevated Cr at 1.7 on admission. CK trended down.  -hold lasix -avoid nephrotoxic meds -BMP in am  Leukocytosis Likely stress response.  h/o hypertension  BP stable.  On home lasix, imdur and metoprolol.   DMII Lab Results  Component Value Date   HGBA1C 6.7*  08/28/2013    Recent Labs  08/29/13 0602 08/29/13 0659 08/29/13 0804  GLUCAP 119* 122* 103*    Chronic sinusitis This is an incidental finding on CT head.   -follow-up as outpatient    Deconditioning PT recommending SNF but pt not amenable-would like HH.    Mild hyponatremia Asymptomatic.  -monitor BMP  Hyperkalemia Resolved.  No EKG changes.  Given kayexalate x 2 without any BM.  -BMP in AM  VTE SCDs  Dispo: Disposition is deferred at this time, awaiting improvement of current medical problems. Anticipated discharge in approximately 2-3 day(s).  Pt is not interested in going to inpatient rehab or SNF.  Would like to go [facility] with [facility].    The patient does have a current PCP Charolotte Eke Powers) and does need an [facility] hospital follow-up appointment after discharge.  Signed: Jones Bales, MD 08/29/2013, 11:49 AM

## 2013-08-29 NOTE — Progress Notes (Signed)
TCTS BRIEF SICU PROGRESS NOTE  1 Day Post-Op  S/P Procedure(s) (LRB): CORONARY ARTERY BYPASS GRAFTING (CABG) x 3: LIMA-LAD, SVG-Ramus, SVG-Diagonal  (N/A) INTRAOPERATIVE TRANSESOPHAGEAL ECHOCARDIOGRAM (N/A) CLIPPING OF ATRIAL APPENDAGE (N/A) EVACUATION OF LEFT HEMOTHORAX (Left)   Patient has remained stable although BP somewhat soft this afternoon after weaning drips and increasing dose of metoprolol  UOP marginal and creatinine up  Plan: Will restart low dose milrinone and renal dose dopamine  Gerald Rosario 08/29/2013 9:56 PM

## 2013-08-29 NOTE — Progress Notes (Signed)
Agree with medical student note see Dr. Gills note for more details.   [place] MD 

## 2013-08-29 NOTE — Addendum Note (Signed)
Addendum created 08/29/13 1413 by Rogers Blocker, CRNA   Modules edited: Charges VN

## 2013-08-29 NOTE — Progress Notes (Signed)
I met and evaluated the patient today. The patient is doing well post CABG. Filed Vitals:   08/29/13 0900 08/29/13 1000 08/29/13 1100 08/29/13 1115  BP: 115/52 124/65 107/58   Pulse: 109 107 100   Temp: 97.5 F (36.4 C) 97.7 F (36.5 C) 97.7 F (36.5 C) 97.5 F (36.4 C)  TempSrc:      Resp: 28 19 10    Height:      Weight:      SpO2: 100% 99% 100%    Mild sore chest. Alert oriented, RRR, clear breath sounds both sides, warm extremities, no/mild pedal edema.   Recent Labs Lab 08/25/13 0230 08/26/13 0335 08/27/13 0720 08/28/13 0223  08/28/13 1519 08/28/13 1559 08/28/13 1722 08/28/13 2310 08/28/13 2333 08/29/13 0405  NA 129* 131* 133* 131*  < > 133* 135* 135*  --  135* 134*  K 4.6 4.9 4.5 4.5  < > 4.6 4.4 4.2  --  4.4 4.7  CL 96 96 99 97  --   --   --   --   --  102 104  CO2 22 21 19 21   --   --   --   --   --   --  19  GLUCOSE 177* 171* 120* 156*  < > 140* 157* 97  --  108* 119*  BUN 32* 34* 23 25*  --   --   --   --   --  22 21  CREATININE 1.07 1.05 0.90 0.95  --   --   --   --  0.90 0.90 0.95  CALCIUM 8.3* 8.4 8.4 8.3*  --   --   --   --   --   --  7.5*  MG 3.0* 2.9* 2.6*  --   --   --   --   --  3.1*  --  2.9*  < > = values in this interval not displayed.   Recent Labs Lab 08/28/13 1700  08/28/13 2310 08/28/13 2333 08/29/13 0405  HGB 9.0*  < > 8.5* 8.2* 8.2*  HCT 26.2*  < > 24.5* 24.0* 23.6*  WBC 22.3*  --  18.0*  --  18.1*  PLT 275  --  250  --  262  < > = values in this interval not displayed.   Inpatient Medications   Scheduled Medications . acetaminophen  1,000 mg Oral 4 times per day  . aspirin EC  325 mg Oral Daily  . bisacodyl  10 mg Oral Daily   Or  . bisacodyl  10 mg Rectal Daily  . cefUROXime (ZINACEF)  IV  1.5 g Intravenous Q12H  . docusate sodium  200 mg Oral Daily  . famotidine (PEPCID) IV  20 mg Intravenous Q12H  . furosemide  20 mg Intravenous Q6H  . insulin aspart  0-24 Units Subcutaneous 6 times per day  . insulin detemir  20 Units  Subcutaneous Daily  . insulin regular  0-10 Units Intravenous TID WC  . metoprolol tartrate  37.5 mg Oral BID  . mometasone-formoterol  2 puff Inhalation BID  . pantoprazole  40 mg Oral Daily  . simvastatin  20 mg Oral q1800  . sodium chloride  3 mL Intravenous Q12H    PRN Medications albumin human, metoprolol, morphine injection, ondansetron (ZOFRAN) IV, oxyCODONE, sodium chloride, traMADol   Assessment and Plan   Continue to monitor for chest pain, hypoxia, hypotension, bleeding, fever, IOs. Continue current management. I have discussed this patient with my  IM team and their note has details of management.

## 2013-08-30 ENCOUNTER — Inpatient Hospital Stay (HOSPITAL_COMMUNITY): Payer: Medicare Other

## 2013-08-30 DIAGNOSIS — S225XXA Flail chest, initial encounter for closed fracture: Secondary | ICD-10-CM

## 2013-08-30 LAB — BASIC METABOLIC PANEL
BUN: 31 mg/dL — ABNORMAL HIGH (ref 6–23)
BUN: 35 mg/dL — ABNORMAL HIGH (ref 6–23)
CO2: 20 mEq/L (ref 19–32)
CO2: 21 mEq/L (ref 19–32)
Calcium: 7.9 mg/dL — ABNORMAL LOW (ref 8.4–10.5)
Calcium: 7.9 mg/dL — ABNORMAL LOW (ref 8.4–10.5)
Chloride: 99 mEq/L (ref 96–112)
Chloride: 97 mEq/L (ref 96–112)
Creatinine, Ser: 1.79 mg/dL — ABNORMAL HIGH (ref 0.50–1.35)
Creatinine, Ser: 1.61 mg/dL — ABNORMAL HIGH (ref 0.50–1.35)
GFR calc Af Amer: 43 mL/min — ABNORMAL LOW (ref >90)
GFR calc Af Amer: 49 mL/min — ABNORMAL LOW (ref >90)
GFR calc non Af Amer: 37 mL/min — ABNORMAL LOW (ref >90)
GFR calc non Af Amer: 42 mL/min — ABNORMAL LOW (ref >90)
Glucose, Bld: 126 mg/dL — ABNORMAL HIGH (ref 70–99)
Glucose, Bld: 116 mg/dL — ABNORMAL HIGH (ref 70–99)
Potassium: 4.5 mEq/L (ref 3.7–5.3)
Potassium: 4.5 mEq/L (ref 3.7–5.3)
Sodium: 132 mEq/L — ABNORMAL LOW (ref 137–147)
Sodium: 127 mEq/L — ABNORMAL LOW (ref 137–147)

## 2013-08-30 LAB — GLUCOSE, CAPILLARY
Glucose-Capillary: 134 mg/dL — ABNORMAL HIGH (ref 70–99)
Glucose-Capillary: 128 mg/dL — ABNORMAL HIGH (ref 70–99)
Glucose-Capillary: 116 mg/dL — ABNORMAL HIGH (ref 70–99)
Glucose-Capillary: 138 mg/dL — ABNORMAL HIGH (ref 70–99)
Glucose-Capillary: 111 mg/dL — ABNORMAL HIGH (ref 70–99)
Glucose-Capillary: 126 mg/dL — ABNORMAL HIGH (ref 70–99)

## 2013-08-30 LAB — CBC
HCT: 23.7 % — ABNORMAL LOW (ref 39.0–52.0)
Hemoglobin: 8.1 g/dL — ABNORMAL LOW (ref 13.0–17.0)
MCH: 28.3 pg (ref 26.0–34.0)
MCHC: 34.2 g/dL (ref 30.0–36.0)
MCV: 82.9 fL (ref 78.0–100.0)
Platelets: 356 10*3/uL (ref 150–400)
RBC: 2.86 MIL/uL — ABNORMAL LOW (ref 4.22–5.81)
RDW: 15.4 % (ref 11.5–15.5)
WBC: 22.2 10*3/uL — ABNORMAL HIGH (ref 4.0–10.5)

## 2013-08-30 MED ORDER — INSULIN ASPART 100 UNIT/ML ~~LOC~~ SOLN
0.0000 [IU] | Freq: Three times a day (TID) | SUBCUTANEOUS | Status: DC
  Administered 2013-08-31: 2 [IU] via SUBCUTANEOUS
  Administered 2013-09-02: 5 [IU] via SUBCUTANEOUS
  Administered 2013-09-02 – 2013-09-03 (×3): 2 [IU] via SUBCUTANEOUS
  Administered 2013-09-03 – 2013-09-04 (×2): 3 [IU] via SUBCUTANEOUS
  Administered 2013-09-04: 5 [IU] via SUBCUTANEOUS
  Administered 2013-09-05: 2 [IU] via SUBCUTANEOUS
  Administered 2013-09-05 (×2): 3 [IU] via SUBCUTANEOUS
  Administered 2013-09-06: 2 [IU] via SUBCUTANEOUS
  Administered 2013-09-06: 3 [IU] via SUBCUTANEOUS
  Administered 2013-09-06: 2 [IU] via SUBCUTANEOUS
  Administered 2013-09-07: 3 [IU] via SUBCUTANEOUS
  Administered 2013-09-07: 2 [IU] via SUBCUTANEOUS
  Administered 2013-09-07 – 2013-09-08 (×2): 3 [IU] via SUBCUTANEOUS
  Administered 2013-09-08: 2 [IU] via SUBCUTANEOUS

## 2013-08-30 MED ORDER — METOCLOPRAMIDE HCL 5 MG/ML IJ SOLN
10.0000 mg | Freq: Four times a day (QID) | INTRAMUSCULAR | Status: AC
  Administered 2013-08-30 – 2013-08-31 (×4): 10 mg via INTRAVENOUS
  Filled 2013-08-30 (×4): qty 2

## 2013-08-30 NOTE — Progress Notes (Signed)
Up in chair  Feels better with chest tubes out  Still having a fair amount of nausea  BP 116/57  Pulse 100  Temp(Src) 97.8 F (36.6 C) (Oral)  Resp 10  Ht 5\' 7"  (1.702 m)  Wt 196 lb 3.4 oz (89 kg)  BMI 30.72 kg/m2  SpO2 97%   Intake/Output Summary (Last 24 hours) at 08/30/13 1820 Last data filed at 08/30/13 1500  Gross per 24 hour  Intake 1120.26 ml  Output    510 ml  Net 610.26 ml    Will try reglan

## 2013-08-30 NOTE — Progress Notes (Signed)
2 Days Post-Op Procedure(s) (LRB): CORONARY ARTERY BYPASS GRAFTING (CABG) x 3: LIMA-LAD, SVG-Ramus, SVG-Diagonal  (N/A) INTRAOPERATIVE TRANSESOPHAGEAL ECHOCARDIOGRAM (N/A) CLIPPING OF ATRIAL APPENDAGE (N/A) EVACUATION OF LEFT HEMOTHORAX (Left) Subjective: C/o pain left chest Also some nausea  Objective: Vital signs in last 24 hours: Temp:  [97.3 F (36.3 C)-98 F (36.7 C)] 98 F (36.7 C) (05/30 0400) Pulse Rate:  [52-109] 102 (05/30 0800) Cardiac Rhythm:  [-] Atrial fibrillation;Normal sinus rhythm (05/30 0600) Resp:  [6-28] 24 (05/30 0800) BP: (90-139)/(44-65) 129/61 mmHg (05/30 0800) SpO2:  [90 %-100 %] 95 % (05/30 0800) Arterial Line BP: (94-130)/(50-74) 94/74 mmHg (05/29 1300) Weight:  [196 lb 3.4 oz (89 kg)] 196 lb 3.4 oz (89 kg) (05/30 0600)  Hemodynamic parameters for last 24 hours: PAP: (46-60)/(25-39) 60/39 mmHg CO:  [3.6 L/min-6 L/min] 5.3 L/min CI:  [1.9 L/min/m2-3.2 L/min/m2] 2.8 L/min/m2  Intake/Output from previous day: 05/29 0701 - 05/30 0700 In: 873.8 [P.O.:150; I.V.:623.8; IV Piggyback:100] Out: 808 [Urine:500; Emesis/NG output:200; Chest Tube:108] Intake/Output this shift:    General appearance: alert and no distress Neurologic: intact Heart: irregularly irregular rhythm Lungs: diminished breath sounds bibasilar Abdomen: mildly distended, nontender, hypoactive bs  Lab Results:  Recent Labs  08/29/13 1645 08/29/13 1648 08/30/13 0400  WBC 21.6*  --  22.2*  HGB 8.3* 9.2* 8.1*  HCT 24.5* 27.0* 23.7*  PLT 310  --  356   BMET:  Recent Labs  08/29/13 0405  08/29/13 1648 08/30/13 0400  NA 134*  --  133* 132*  K 4.7  --  4.7 4.5  CL 104  --  101 99  CO2 19  --   --  20  GLUCOSE 119*  --  138* 126*  BUN 21  --  23 31*  CREATININE 0.95  < > 1.70* 1.79*  CALCIUM 7.5*  --   --  7.9*  < > = values in this interval not displayed.  PT/INR:  Recent Labs  08/28/13 1700  LABPROT 22.0*  INR 1.99*   ABG    Component Value Date/Time   PHART  7.395 08/29/2013 0049   HCO3 19.8* 08/29/2013 0049   TCO2 20 08/29/2013 1648   ACIDBASEDEF 4.0* 08/29/2013 0049   O2SAT 92.0 08/29/2013 0049   CBG (last 3)   Recent Labs  08/29/13 1956 08/29/13 2324 08/30/13 0337  GLUCAP 133* 137* 128*    Assessment/Plan: S/P Procedure(s) (LRB): CORONARY ARTERY BYPASS GRAFTING (CABG) x 3: LIMA-LAD, SVG-Ramus, SVG-Diagonal  (N/A) INTRAOPERATIVE TRANSESOPHAGEAL ECHOCARDIOGRAM (N/A) CLIPPING OF ATRIAL APPENDAGE (N/A) EVACUATION OF LEFT HEMOTHORAX (Left) POD # 2  CV- dopamine and milrinone restarted last PM. Will wean milrinone this Am, continue dopamine for now.  On amiodarone- in a fib - rate controlled  RESP- left hemothorax- minimal drainage from left pleural tube- 20 ml last 12 hours, CXR OK  Dc left chest tube  RENAL- creatinine up slightly- c/w ATN  Continue dopamine  Diurese as tolerated  ENDO- CBG well controlled with current regimen  No pharmacologic DVT proph due to hemothorax     LOS: 12 days    Gerald Rosario 08/30/2013

## 2013-08-31 ENCOUNTER — Inpatient Hospital Stay (HOSPITAL_COMMUNITY): Payer: Medicare Other

## 2013-08-31 LAB — TYPE AND SCREEN
ABO/RH(D): A POS
Antibody Screen: NEGATIVE
Unit division: 0
Unit division: 0
Unit division: 0
Unit division: 0
Unit division: 0
Unit division: 0

## 2013-08-31 LAB — CBC
HCT: 22.9 % — ABNORMAL LOW (ref 39.0–52.0)
Hemoglobin: 7.9 g/dL — ABNORMAL LOW (ref 13.0–17.0)
MCH: 28.5 pg (ref 26.0–34.0)
MCHC: 34.5 g/dL (ref 30.0–36.0)
MCV: 82.7 fL (ref 78.0–100.0)
Platelets: 339 10*3/uL (ref 150–400)
RBC: 2.77 MIL/uL — ABNORMAL LOW (ref 4.22–5.81)
RDW: 15.6 % — ABNORMAL HIGH (ref 11.5–15.5)
WBC: 17.3 10*3/uL — ABNORMAL HIGH (ref 4.0–10.5)

## 2013-08-31 LAB — BASIC METABOLIC PANEL
BUN: 35 mg/dL — ABNORMAL HIGH (ref 6–23)
CO2: 21 mEq/L (ref 19–32)
Calcium: 7.9 mg/dL — ABNORMAL LOW (ref 8.4–10.5)
Chloride: 97 mEq/L (ref 96–112)
Creatinine, Ser: 1.6 mg/dL — ABNORMAL HIGH (ref 0.50–1.35)
GFR calc Af Amer: 49 mL/min — ABNORMAL LOW (ref >90)
GFR calc non Af Amer: 43 mL/min — ABNORMAL LOW (ref >90)
Glucose, Bld: 76 mg/dL (ref 70–99)
Potassium: 4.2 mEq/L (ref 3.7–5.3)
Sodium: 129 mEq/L — ABNORMAL LOW (ref 137–147)

## 2013-08-31 LAB — GLUCOSE, CAPILLARY
Glucose-Capillary: 104 mg/dL — ABNORMAL HIGH (ref 70–99)
Glucose-Capillary: 125 mg/dL — ABNORMAL HIGH (ref 70–99)
Glucose-Capillary: 50 mg/dL — ABNORMAL LOW (ref 70–99)
Glucose-Capillary: 51 mg/dL — ABNORMAL LOW (ref 70–99)
Glucose-Capillary: 60 mg/dL — ABNORMAL LOW (ref 70–99)
Glucose-Capillary: 68 mg/dL — ABNORMAL LOW (ref 70–99)
Glucose-Capillary: 77 mg/dL (ref 70–99)
Glucose-Capillary: 81 mg/dL (ref 70–99)
Glucose-Capillary: 85 mg/dL (ref 70–99)

## 2013-08-31 MED ORDER — FUROSEMIDE 10 MG/ML IJ SOLN
40.0000 mg | Freq: Two times a day (BID) | INTRAMUSCULAR | Status: AC
  Administered 2013-08-31 (×2): 40 mg via INTRAVENOUS
  Filled 2013-08-31 (×2): qty 4

## 2013-08-31 MED ORDER — GLUCOSE 40 % PO GEL
ORAL | Status: AC
  Administered 2013-08-31: 37.5 g
  Filled 2013-08-31: qty 1

## 2013-08-31 NOTE — Progress Notes (Signed)
3 Days Post-Op Procedure(s) (LRB): CORONARY ARTERY BYPASS GRAFTING (CABG) x 3: LIMA-LAD, SVG-Ramus, SVG-Diagonal  (N/A) INTRAOPERATIVE TRANSESOPHAGEAL ECHOCARDIOGRAM (N/A) CLIPPING OF ATRIAL APPENDAGE (N/A) EVACUATION OF LEFT HEMOTHORAX (Left) Subjective: Nausea a little bit better, no BM or flatus Concerned about swelling  Objective: Vital signs in last 24 hours: Temp:  [97.8 F (36.6 C)-98.2 F (36.8 C)] 98.2 F (36.8 C) (05/31 0352) Pulse Rate:  [89-115] 96 (05/31 0700) Cardiac Rhythm:  [-] Atrial fibrillation (05/31 0700) Resp:  [8-32] 11 (05/31 0700) BP: (89-139)/(40-77) 113/69 mmHg (05/31 0700) SpO2:  [95 %-100 %] 98 % (05/31 0700) Weight:  [202 lb 2.6 oz (91.7 kg)] 202 lb 2.6 oz (91.7 kg) (05/31 0500)  Hemodynamic parameters for last 24 hours:    Intake/Output from previous day: 05/30 0701 - 05/31 0700 In: 1331.3 [P.O.:750; I.V.:531.3; IV Piggyback:50] Out: 870 [Urine:870] Intake/Output this shift:    General appearance: alert and no distress Neurologic: intact Heart: regular rate and rhythm Lungs: diminished breath sounds bibasilar Abdomen: distended, improved BS HEART is irregularly irregular  Lab Results:  Recent Labs  08/30/13 0400 08/31/13 0420  WBC 22.2* 17.3*  HGB 8.1* 7.9*  HCT 23.7* 22.9*  PLT 356 339   BMET:  Recent Labs  08/30/13 1650 08/31/13 0420  NA 127* 129*  K 4.5 4.2  CL 97 97  CO2 21 21  GLUCOSE 116* 76  BUN 35* 35*  CREATININE 1.61* 1.60*  CALCIUM 7.9* 7.9*    PT/INR:  Recent Labs  08/28/13 1700  LABPROT 22.0*  INR 1.99*   ABG    Component Value Date/Time   PHART 7.395 08/29/2013 0049   HCO3 19.8* 08/29/2013 0049   TCO2 20 08/29/2013 1648   ACIDBASEDEF 4.0* 08/29/2013 0049   O2SAT 92.0 08/29/2013 0049   CBG (last 3)   Recent Labs  08/30/13 1631 08/30/13 1941 08/30/13 2215  GLUCAP 111* 126* 104*    Assessment/Plan: S/P Procedure(s) (LRB): CORONARY ARTERY BYPASS GRAFTING (CABG) x 3: LIMA-LAD, SVG-Ramus,  SVG-Diagonal  (N/A) INTRAOPERATIVE TRANSESOPHAGEAL ECHOCARDIOGRAM (N/A) CLIPPING OF ATRIAL APPENDAGE (N/A) EVACUATION OF LEFT HEMOTHORAX (Left) - CV- still in a fib, rate controlled on amiodarone gtt  Not a candidate for anticoagulation due to trauma/ hemothorax  RESP- CXR unchanged  RENAL- creatinine better, volume overloaded- continue dopamine today and diurese  ENDO - CBG well controlled  GI- still distended but BS better and less nausea  LOS: 13 days    Melrose Nakayama 08/31/2013

## 2013-08-31 NOTE — Progress Notes (Signed)
Hypoglycemic Event  CBG: 51    Treatment: 15 GM carbohydrate snack  Symptoms: None  Follow-up CBG: Time:2318 CBG Result:50  Treatment: 15 GM Carb snack   follow up CBG: 81   Possible Reasons for Event: Inadequate meal intake  Comments/MD notified: N/A  will pass to day shift to adjust long acting insulin    Gerald Rosario  Remember to initiate Hypoglycemia Order Set & complete

## 2013-08-31 NOTE — Progress Notes (Signed)
Up in chair  Feels better, nausea greatly improved, but still hasn't tried solid food  BP 116/73  Pulse 92  Temp(Src) 98.3 F (36.8 C) (Oral)  Resp 15  Ht 5\' 7"  (1.702 m)  Wt 202 lb 2.6 oz (91.7 kg)  BMI 31.66 kg/m2  SpO2 97%   Intake/Output Summary (Last 24 hours) at 08/31/13 1945 Last data filed at 08/31/13 1900  Gross per 24 hour  Intake 1579.9 ml  Output   1425 ml  Net  154.9 ml   Stable day, recheck creatinine in AM

## 2013-09-01 ENCOUNTER — Encounter (HOSPITAL_COMMUNITY): Payer: Self-pay | Admitting: Thoracic Surgery (Cardiothoracic Vascular Surgery)

## 2013-09-01 ENCOUNTER — Inpatient Hospital Stay (HOSPITAL_COMMUNITY): Payer: Medicare Other

## 2013-09-01 LAB — BASIC METABOLIC PANEL
BUN: 27 mg/dL — ABNORMAL HIGH (ref 6–23)
CO2: 22 mEq/L (ref 19–32)
Calcium: 7.6 mg/dL — ABNORMAL LOW (ref 8.4–10.5)
Chloride: 99 mEq/L (ref 96–112)
Creatinine, Ser: 1.1 mg/dL (ref 0.50–1.35)
GFR calc Af Amer: 78 mL/min — ABNORMAL LOW (ref >90)
GFR calc non Af Amer: 67 mL/min — ABNORMAL LOW (ref >90)
Glucose, Bld: 102 mg/dL — ABNORMAL HIGH (ref 70–99)
Potassium: 4.4 mEq/L (ref 3.7–5.3)
Sodium: 132 mEq/L — ABNORMAL LOW (ref 137–147)

## 2013-09-01 LAB — GLUCOSE, CAPILLARY
Glucose-Capillary: 102 mg/dL — ABNORMAL HIGH (ref 70–99)
Glucose-Capillary: 117 mg/dL — ABNORMAL HIGH (ref 70–99)
Glucose-Capillary: 110 mg/dL — ABNORMAL HIGH (ref 70–99)
Glucose-Capillary: 141 mg/dL — ABNORMAL HIGH (ref 70–99)

## 2013-09-01 LAB — CBC
HCT: 23.4 % — ABNORMAL LOW (ref 39.0–52.0)
Hemoglobin: 8 g/dL — ABNORMAL LOW (ref 13.0–17.0)
MCH: 28.3 pg (ref 26.0–34.0)
MCHC: 34.2 g/dL (ref 30.0–36.0)
MCV: 82.7 fL (ref 78.0–100.0)
Platelets: 384 10*3/uL (ref 150–400)
RBC: 2.83 MIL/uL — ABNORMAL LOW (ref 4.22–5.81)
RDW: 15.7 % — ABNORMAL HIGH (ref 11.5–15.5)
WBC: 16.2 10*3/uL — ABNORMAL HIGH (ref 4.0–10.5)

## 2013-09-01 MED ORDER — SODIUM CHLORIDE 0.9 % IJ SOLN
3.0000 mL | Freq: Two times a day (BID) | INTRAMUSCULAR | Status: DC
  Administered 2013-09-01 – 2013-09-07 (×9): 3 mL via INTRAVENOUS

## 2013-09-01 MED ORDER — POTASSIUM CHLORIDE CRYS ER 20 MEQ PO TBCR
20.0000 meq | EXTENDED_RELEASE_TABLET | Freq: Two times a day (BID) | ORAL | Status: DC
  Administered 2013-09-02 (×2): 20 meq via ORAL
  Filled 2013-09-01 (×4): qty 1

## 2013-09-01 MED ORDER — COUMADIN BOOK
Freq: Once | Status: AC
  Administered 2013-09-01: 14:00:00
  Filled 2013-09-01: qty 1

## 2013-09-01 MED ORDER — WARFARIN SODIUM 2.5 MG PO TABS
2.5000 mg | ORAL_TABLET | Freq: Every day | ORAL | Status: DC
  Administered 2013-09-01 – 2013-09-03 (×3): 2.5 mg via ORAL
  Filled 2013-09-01 (×4): qty 1

## 2013-09-01 MED ORDER — SODIUM CHLORIDE 0.9 % IJ SOLN: 3.0000 mL | INTRAMUSCULAR | Status: DC | PRN

## 2013-09-01 MED ORDER — ASPIRIN EC 81 MG PO TBEC
81.0000 mg | DELAYED_RELEASE_TABLET | Freq: Every day | ORAL | Status: DC
  Administered 2013-09-01 – 2013-09-08 (×8): 81 mg via ORAL
  Filled 2013-09-01 (×8): qty 1

## 2013-09-01 MED ORDER — MOVING RIGHT ALONG BOOK
Freq: Once | Status: AC
  Administered 2013-09-01: 08:00:00
  Filled 2013-09-01: qty 1

## 2013-09-01 MED ORDER — WARFARIN - PHYSICIAN DOSING INPATIENT
Freq: Every day | Status: DC
  Administered 2013-09-01 – 2013-09-05 (×2)

## 2013-09-01 MED ORDER — WARFARIN VIDEO: Freq: Once | Status: DC

## 2013-09-01 MED ORDER — SODIUM CHLORIDE 0.9 % IV SOLN: 250.0000 mL | INTRAVENOUS | Status: DC | PRN

## 2013-09-01 MED ORDER — FUROSEMIDE 40 MG PO TABS
40.0000 mg | ORAL_TABLET | Freq: Two times a day (BID) | ORAL | Status: DC
  Administered 2013-09-01 – 2013-09-02 (×3): 40 mg via ORAL
  Filled 2013-09-01 (×5): qty 1

## 2013-09-01 MED ORDER — AMIODARONE HCL 200 MG PO TABS
400.0000 mg | ORAL_TABLET | Freq: Two times a day (BID) | ORAL | Status: DC
  Administered 2013-09-01 (×2): 400 mg via ORAL
  Filled 2013-09-01 (×5): qty 2

## 2013-09-01 MED ORDER — ZOLPIDEM TARTRATE 5 MG PO TABS: 5.0000 mg | ORAL_TABLET | Freq: Every evening | ORAL | Status: DC | PRN

## 2013-09-01 NOTE — Progress Notes (Signed)
1450 Came to see pt to walk. Stated he just walked part of way over here. We will follow up tomorrow. Got pt some coffee. Graylon Good RN BSN 09/01/2013 2:57 PM

## 2013-09-01 NOTE — Progress Notes (Addendum)
      [address]       [place] [address]             [phone]        CARDIOTHORACIC SURGERY PROGRESS NOTE   R4 Days Post-Op Procedure(s) (LRB): CORONARY ARTERY BYPASS GRAFTING (CABG) x 3: LIMA-LAD, SVG-Ramus, SVG-Diagonal  (N/A) INTRAOPERATIVE TRANSESOPHAGEAL ECHOCARDIOGRAM (N/A) CLIPPING OF ATRIAL APPENDAGE (N/A) EVACUATION OF LEFT HEMOTHORAX (Left)  Subjective: Feels pretty well.  Soreness in left chest improved considerably.  Denies SOB other than some dyspnea with ambulation.  Marginal appetite.  No BM yet.  Objective: Vital signs: BP Readings from Last 1 Encounters:  09/01/13 108/77   Pulse Readings from Last 1 Encounters:  09/01/13 100   Resp Readings from Last 1 Encounters:  09/01/13 13   Temp Readings from Last 1 Encounters:  09/01/13 98 F (36.7 C) Oral    Hemodynamics:    Physical Exam:  Rhythm:   Afib w/ controlled rate  Breath sounds: Fairly clear, diminished left base  Heart sounds:  irreg  Incisions:  Clean and dry  Abdomen:  Soft, non-distended, non-tender  Extremities:  Warm, well-perfused, moderate edema   Intake/Output from previous day: 05/31 0701 - 06/01 0700 In: 1979.9 [P.O.:1440; I.V.:539.9] Out: 2050 [Urine:2050] Intake/Output this shift:    Lab Results:  CBC: Recent Labs  08/31/13 0420 09/01/13 0345  WBC 17.3* 16.2*  HGB 7.9* 8.0*  HCT 22.9* 23.4*  PLT 339 384    BMET:  Recent Labs  08/31/13 0420 09/01/13 0345  NA 129* 132*  K 4.2 4.4  CL 97 99  CO2 21 22  GLUCOSE 76 102*  BUN 35* 27*  CREATININE 1.60* 1.10  CALCIUM 7.9* 7.6*     CBG (last 3)   Recent Labs  08/31/13 2312 08/31/13 2346 09/01/13 0353  GLUCAP 50* 81 102*    ABG    Component Value Date/Time   PHART 7.395 08/29/2013 0049   PCO2ART 32.5* 08/29/2013 0049   PO2ART 64.0* 08/29/2013 0049   HCO3 19.8* 08/29/2013 0049   TCO2 20 08/29/2013 1648   ACIDBASEDEF 4.0* 08/29/2013 0049   O2SAT 92.0 08/29/2013 0049    CXR: Looks  good.  Improved aeration left base.  Assessment/Plan: S/P Procedure(s) (LRB): CORONARY ARTERY BYPASS GRAFTING (CABG) x 3: LIMA-LAD, SVG-Ramus, SVG-Diagonal  (N/A) INTRAOPERATIVE TRANSESOPHAGEAL ECHOCARDIOGRAM (N/A) CLIPPING OF ATRIAL APPENDAGE (N/A) EVACUATION OF LEFT HEMOTHORAX (Left)  Overall doing reasonably well Recurrent persistent Afib, rate controlled Acute renal insufficiency has resolved Expected post op acute blood loss anemia, stable Expected post op volume excess, mild, diuresing Type II diabetes mellitus, good glycemic control, CBG's low overnight   Mobilize  D/C dopamine  Convert amiodarone to oral  Diuresis  Start coumadin  hold levemir and restart oral agents when appetite improves   Rexene Alberts 09/01/2013 7:19 AM

## 2013-09-01 NOTE — Progress Notes (Addendum)
CSW spoke to patient in regards to SNF placement. Patient states that he is agreeable to SNF and states that after SNF, his daughter who is an ICU nurse will come in from [place] and stay with patient.    Jeanette Caprice, MSW, [place]

## 2013-09-02 ENCOUNTER — Inpatient Hospital Stay (HOSPITAL_COMMUNITY): Payer: Medicare Other

## 2013-09-02 LAB — GLUCOSE, CAPILLARY
Glucose-Capillary: 160 mg/dL — ABNORMAL HIGH (ref 70–99)
Glucose-Capillary: 146 mg/dL — ABNORMAL HIGH (ref 70–99)
Glucose-Capillary: 206 mg/dL — ABNORMAL HIGH (ref 70–99)
Glucose-Capillary: 139 mg/dL — ABNORMAL HIGH (ref 70–99)
Glucose-Capillary: 135 mg/dL — ABNORMAL HIGH (ref 70–99)

## 2013-09-02 LAB — URINALYSIS, ROUTINE W REFLEX MICROSCOPIC
Glucose, UA: NEGATIVE mg/dL
Hgb urine dipstick: NEGATIVE
Ketones, ur: NEGATIVE mg/dL
Leukocytes, UA: NEGATIVE
Nitrite: NEGATIVE
Protein, ur: NEGATIVE mg/dL
Specific Gravity, Urine: 1.018 (ref 1.005–1.030)
Urobilinogen, UA: 1 mg/dL (ref 0.0–1.0)
pH: 5 (ref 5.0–8.0)

## 2013-09-02 LAB — PROTIME-INR
INR: 1.53 — ABNORMAL HIGH (ref 0.00–1.49)
Prothrombin Time: 18 seconds — ABNORMAL HIGH (ref 11.6–15.2)

## 2013-09-02 LAB — BASIC METABOLIC PANEL
BUN: 32 mg/dL — ABNORMAL HIGH (ref 6–23)
CO2: 19 mEq/L (ref 19–32)
Calcium: 7.7 mg/dL — ABNORMAL LOW (ref 8.4–10.5)
Chloride: 96 mEq/L (ref 96–112)
Creatinine, Ser: 1.24 mg/dL (ref 0.50–1.35)
GFR calc Af Amer: 67 mL/min — ABNORMAL LOW (ref >90)
GFR calc non Af Amer: 58 mL/min — ABNORMAL LOW (ref >90)
Glucose, Bld: 153 mg/dL — ABNORMAL HIGH (ref 70–99)
Potassium: 5.2 mEq/L (ref 3.7–5.3)
Sodium: 130 mEq/L — ABNORMAL LOW (ref 137–147)

## 2013-09-02 LAB — CBC
HCT: 23.2 % — ABNORMAL LOW (ref 39.0–52.0)
Hemoglobin: 7.9 g/dL — ABNORMAL LOW (ref 13.0–17.0)
MCH: 28.4 pg (ref 26.0–34.0)
MCHC: 34.1 g/dL (ref 30.0–36.0)
MCV: 83.5 fL (ref 78.0–100.0)
Platelets: 412 10*3/uL — ABNORMAL HIGH (ref 150–400)
RBC: 2.78 MIL/uL — ABNORMAL LOW (ref 4.22–5.81)
RDW: 15.6 % — ABNORMAL HIGH (ref 11.5–15.5)
WBC: 21.4 10*3/uL — ABNORMAL HIGH (ref 4.0–10.5)

## 2013-09-02 MED ORDER — AMIODARONE HCL 200 MG PO TABS
200.0000 mg | ORAL_TABLET | Freq: Two times a day (BID) | ORAL | Status: DC
  Administered 2013-09-02 – 2013-09-08 (×13): 200 mg via ORAL
  Filled 2013-09-02 (×14): qty 1

## 2013-09-02 MED ORDER — FUROSEMIDE 40 MG PO TABS
40.0000 mg | ORAL_TABLET | Freq: Two times a day (BID) | ORAL | Status: DC
  Administered 2013-09-03: 40 mg via ORAL
  Filled 2013-09-02 (×3): qty 1

## 2013-09-02 MED ORDER — TAMSULOSIN HCL 0.4 MG PO CAPS
0.4000 mg | ORAL_CAPSULE | Freq: Every day | ORAL | Status: DC
  Administered 2013-09-02 – 2013-09-08 (×7): 0.4 mg via ORAL
  Filled 2013-09-02 (×7): qty 1

## 2013-09-02 MED ORDER — FUROSEMIDE 10 MG/ML IJ SOLN
40.0000 mg | Freq: Once | INTRAMUSCULAR | Status: AC
  Administered 2013-09-02: 40 mg via INTRAVENOUS
  Filled 2013-09-02: qty 4

## 2013-09-02 MED ORDER — FERROUS SULFATE 325 (65 FE) MG PO TABS
325.0000 mg | ORAL_TABLET | Freq: Every day | ORAL | Status: DC
  Administered 2013-09-03 – 2013-09-08 (×6): 325 mg via ORAL
  Filled 2013-09-02 (×7): qty 1

## 2013-09-02 NOTE — Progress Notes (Signed)
CARDIAC REHAB PHASE I   PRE:  Rate/Rhythm: 84 afib  BP:  Supine:   Sitting: 111/57  Standing:    SaO2: 95%RA  MODE:  Ambulation: 36 ft   POST:  Rate/Rhythm: 101afib                         SaO2: 83%RA   C/o dizziness at 18 ft and checked sats. Back to room  1050-1125 Pt walked 18 ft on RA with rolling walker and asst x 1 and c/o dizziness. He was DOE. Checked sats and 83%. walked to room which was 18 ft and sat pt at door. Pt on 2L. Heart rate at 101. Encouraged pt to purse lip breathe. Pt did not feel like he could try to walk again. Assisted RN to clean pt's bottom as he requested to be cleaned. Helped to bed for bladder scan. Encouraged him to walk on oxygen later with staff. Needs much assistance to stand due to back and rib issues.    Graylon Good, RN BSN  09/02/2013 11:19 AM

## 2013-09-02 NOTE — Progress Notes (Signed)
09/02/2013 0745 Pt. Still no spontaneous void per night shift RN. Bladder scan performed 263CC residual urine in bladder. [facility] on floor and made aware.  [place]

## 2013-09-02 NOTE — Progress Notes (Signed)
No urine output for eight hours after foley cath was D/C,bladder scan done with 471ml of residual urine noted,In and out cath perform per protocol and obtained 330 urine output,will continue to monitor.

## 2013-09-02 NOTE — Progress Notes (Signed)
Pt has no spontaneous  urine output at 2 AM , 6 hours since in and out cath,bladder scan done every hour started at 3 AM to monitor for residual volume,residual volume by 6:45 AM was 312 ml,pt has no complaint of discomfort.Will pass on report to the morning nurse to monitor further. Sharika Mosquera Netta Corrigan RN

## 2013-09-02 NOTE — Discharge Instructions (Addendum)
Information on my medicine - Coumadin   (Warfarin)  This medication education was reviewed with me or my healthcare representative as part of my discharge preparation.  The pharmacist that spoke with me during my hospital stay was:  [place], RPH  Why was Coumadin prescribed for you? Coumadin was prescribed for you because you have a blood clot or a medical condition that can cause an increased risk of forming blood clots. Blood clots can cause serious health problems by blocking the flow of blood to the heart, lung, or brain. Coumadin can prevent harmful blood clots from forming. As a reminder your indication for Coumadin is:   Stroke Prevention Because Of Atrial Fibrillation  What test will check on my response to Coumadin? While on Coumadin (warfarin) you will need to have an INR test regularly to ensure that your dose is keeping you in the desired range. The INR (international normalized ratio) number is calculated from the result of the laboratory test called prothrombin time (PT).  If an INR APPOINTMENT HAS NOT ALREADY BEEN MADE FOR YOU please schedule an appointment to have this lab work done by your health care provider within 7 days. Your INR goal is usually a number between:  2 to 3 or your provider may give you a more narrow range like 2-2.5.  Ask your health care provider during an office visit what your goal INR is.  What  do you need to  know  About  COUMADIN? Take Coumadin (warfarin) exactly as prescribed by your healthcare provider about the same time each day.  DO NOT stop taking without talking to the doctor who prescribed the medication.  Stopping without other blood clot prevention medication to take the place of Coumadin may increase your risk of developing a new clot or stroke.  Get refills before you run out.  What do you do if you miss a dose? If you miss a dose, take it as soon as you remember on the same day then continue your regularly scheduled regimen the next day.   Do not take two doses of Coumadin at the same time.  Important Safety Information A possible side effect of Coumadin (Warfarin) is an increased risk of bleeding. You should call your healthcare provider right away if you experience any of the following:   Bleeding from an injury or your nose that does not stop.   Unusual colored urine (red or dark brown) or unusual colored stools (red or black).   Unusual bruising for unknown reasons.   A serious fall or if you hit your head (even if there is no bleeding).  Some foods or medicines interact with Coumadin (warfarin) and might alter your response to warfarin. To help avoid this:   Eat a balanced diet, maintaining a consistent amount of Vitamin K.   Notify your provider about major diet changes you plan to make.   Avoid alcohol or limit your intake to 1 drink for women and 2 drinks for men per day. (1 drink is 5 oz. wine, 12 oz. beer, or 1.5 oz. liquor.)  Make sure that ANY health care provider who prescribes medication for you knows that you are taking Coumadin (warfarin).  Also make sure the healthcare provider who is monitoring your Coumadin knows when you have started a new medication including herbals and non-prescription products.  Coumadin (Warfarin)  Major Drug Interactions  Increased Warfarin Effect Decreased Warfarin Effect  Alcohol (large quantities) Antibiotics (esp. Septra/Bactrim, Flagyl, Cipro) Amiodarone (Cordarone) Aspirin (  ASA) Cimetidine (Tagamet) Megestrol (Megace) NSAIDs (ibuprofen, naproxen, etc.) Piroxicam (Feldene) Propafenone (Rythmol SR) Propranolol (Inderal) Isoniazid (INH) Posaconazole (Noxafil) Barbiturates (Phenobarbital) Carbamazepine (Tegretol) Chlordiazepoxide (Librium) Cholestyramine (Questran) Griseofulvin Oral Contraceptives Rifampin Sucralfate (Carafate) Vitamin K   Coumadin (Warfarin) Major Herbal Interactions  Increased Warfarin Effect Decreased Warfarin Effect  Garlic Ginseng Ginkgo  biloba Coenzyme Q10 Green tea St. Johns wort    Coumadin (Warfarin) FOOD Interactions  Eat a consistent number of servings per week of foods HIGH in Vitamin K (1 serving =  cup)  Collards (cooked, or boiled & drained) Kale (cooked, or boiled & drained) Mustard greens (cooked, or boiled & drained) Parsley *serving size only =  cup Spinach (cooked, or boiled & drained) Swiss chard (cooked, or boiled & drained) Turnip greens (cooked, or boiled & drained)  Eat a consistent number of servings per week of foods MEDIUM-HIGH in Vitamin K (1 serving = 1 cup)  Asparagus (cooked, or boiled & drained) Broccoli (cooked, boiled & drained, or raw & chopped) Brussel sprouts (cooked, or boiled & drained) *serving size only =  cup Lettuce, raw (green leaf, endive, romaine) Spinach, raw Turnip greens, raw & chopped   These websites have more information on Coumadin (warfarin):  FailFactory.se; VeganReport.com.au;  Endoscopic Saphenous Vein Harvesting Care After Refer to this sheet in the next few weeks. These instructions provide you with information on caring for yourself after your procedure. Your caregiver may also give you more specific instructions. Your treatment has been planned according to current medical practices, but problems sometimes occur. Call your caregiver if you have any problems or questions after your procedure. HOME CARE INSTRUCTIONS Medicine  Take whatever pain medicine your surgeon prescribes. Follow the directions carefully. Do not take over-the-counter pain medicine unless your surgeon says it is okay. Some pain medicine can cause bleeding problems for several weeks after surgery.  Follow your surgeon's instructions about driving. You will probably not be permitted to drive after heart surgery.  Take any medicines your surgeon prescribes. Any medicines you took before your heart surgery should be checked with your caregiver before you start taking  them again. Wound care  Ask your surgeon how long you should keep wearing your elastic bandage or stocking.  Check the area around your surgical cuts (incisions) whenever your bandages (dressings) are changed. Look for any redness or swelling.  You will need to return to have the stitches (sutures) or staples taken out. Ask your surgeon when to do that.  Ask your surgeon when you can shower or bathe. Activity  Try to keep your legs raised when you are sitting.  Do any exercises your caregivers have given you. These may include deep breathing exercises, coughing, walking, or other exercises. SEEK MEDICAL CARE IF:  You have any questions about your medicines.  You have more leg pain, especially if your pain medicine stops working.  New or growing bruises develop on your leg.  Your leg swells, feels tight, or becomes red.  You have numbness in your leg. SEEK IMMEDIATE MEDICAL CARE IF:  Your pain gets much worse.  Blood or fluid leaks from any of the incisions.  Your incisions become warm, swollen, or red.  You have chest pain.  You have trouble breathing.  You have a fever.  You have more pain near your leg incision. MAKE SURE YOU:  Understand these instructions.  Will watch your condition.  Will get help right away if you are not doing well or get worse. Document Released: 11/30/2010 Document  Revised: 06/12/2011 Document Reviewed: 11/30/2010 [facility] Patient Information 2014 [place]. Coronary Artery Bypass Grafting, Care After Refer to this sheet in the next few weeks. These instructions provide you with information on caring for yourself after your procedure. Your health care provider may also give you more specific instructions. Your treatment has been planned according to current medical practices, but problems sometimes occur. Call your health care provider if you have any problems or questions after your procedure. WHAT TO EXPECT AFTER THE  PROCEDURE Recovery from surgery will be different for everyone. Some people feel well after 3 or 4 weeks, while for others it takes longer. After your procedure, it is typical to have the following:  Nausea and a lack of appetite.   Constipation.  Weakness and fatigue.   Depression or irritability.   Pain or discomfort at your incision site. HOME CARE INSTRUCTIONS  Only take over-the-counter or prescription medicines as directed by your health care provider. Take all medicines exactly as directed. Do not stop taking medicines or start any new medicines without first checking with your health care provider.   Take your pulse as directed by your health care provider.  Perform deep breathing as directed by your health care provider. If you were given a device called an incentive spirometer, use it to practice deep breathing several times a day. Support your chest with a pillow or your arms when you take deep breaths or cough.  Keep incision areas clean, dry, and protected. Remove or change any bandages (dressings) only as directed by your health care provider. You may have skin adhesive strips over the incision areas. Do not take the strips off. They will fall off on their own.  Check incision areas daily for any swelling, redness, or drainage.  If incisions were made in your legs, do the following:  Avoid crossing your legs.   Avoid sitting for long periods of time. Change positions every 30 minutes.   Elevate your legs when you are sitting.   Wear compression stockings as directed by your health care provider. These stockings help keep blood clots from forming in your legs.  Take showers once your health care provider approves. Until then, only take sponge baths. Pat incisions dry. Do not rub incisions with a washcloth or towel. Do not take tub baths or go swimming until your health care provider approves.  Eat foods that are high in fiber, such as raw fruits and vegetables,  whole grains, beans, and nuts. Meats should be lean cut. Avoid canned, processed, and fried foods.  Drink enough fluids to keep your urine clear or pale yellow.  Weigh yourself every day. This helps identify if you are retaining fluid that may make your heart and lungs work harder.   Rest and limit activity as directed by your health care provider. You may be instructed to:  Stop any activity at once if you have chest pain, shortness of breath, irregular heartbeats, or dizziness. Get help right away if you have any of these symptoms.  Move around frequently for short periods or take short walks as directed by your health care provider. Increase your activities gradually. You may need physical therapy or cardiac rehabilitation to help strengthen your muscles and build your endurance.  Avoid lifting, pushing, or pulling anything heavier than 10 lb (4.5 kg) for at least 6 weeks after surgery.  Do not drive until your health care provider approves.  Ask your health care provider when you may return to work and  resume sexual activity.  Follow up with your health care provider as directed.  SEEK MEDICAL CARE IF:  You have swelling, redness, increasing pain, or drainage at the site of an incision.   You develop a fever.   You have swelling in your ankles or legs.   You have pain in your legs.   You have weight gain of 2 or more pounds a day.  You are nauseous or vomit.  You have diarrhea. SEEK IMMEDIATE MEDICAL CARE IF:  You have chest pain that goes to your jaw or arms.  You have shortness of breath.   You have a fast or irregular heartbeat.   You notice a "clicking" in your breastbone (sternum) when you move.   You have numbness or weakness in your arms or legs.  You feel dizzy or lightheaded.  MAKE SURE YOU:  Understand these instructions.  Will watch your condition.  Will get help right away if you are not doing well or get worse. Document Released:  10/07/2004 Document Revised: 11/20/2012 Document Reviewed: 08/27/2012 [facility] Patient Information 2014 [place].

## 2013-09-02 NOTE — Progress Notes (Signed)
09/02/2013 6:20 PM Pt. Has not been able to spontaneously void since I &O cath performed at 1145. Pt. Received IV lasix as scheduled and pt. States no sensation of a need to void at this time. Bladder scan performed showing 447 cc urine in bladder. Dr. Cyndia Bent paged and made aware that if I&O cath again at this time would be the third I&O cath in 24 hours. Verbal orders received to replace foley at this time. Orders enacted and patient updated on plan of care.  [place]

## 2013-09-02 NOTE — Progress Notes (Addendum)
[address]       [place] [address]             [phone]      5 Days Post-Op Procedure(s) (LRB): CORONARY ARTERY BYPASS GRAFTING (CABG) x 3: LIMA-LAD, SVG-Ramus, SVG-Diagonal  (N/A) INTRAOPERATIVE TRANSESOPHAGEAL ECHOCARDIOGRAM (N/A) CLIPPING OF ATRIAL APPENDAGE (N/A) EVACUATION OF LEFT HEMOTHORAX (Left) Subjective: Feels ok overall, still somewhat weak, has not voided since catheter removed other than I/O cath. Scans have been in 200-300 cc range  Objective: Vital signs in last 24 hours: Temp:  [97.2 F (36.2 C)-98 F (36.7 C)] 98 F (36.7 C) (06/02 0340) Pulse Rate:  [84-96] 85 (06/02 0340) Cardiac Rhythm:  [-] Atrial fibrillation (06/01 1920) Resp:  [13-18] 18 (06/02 0340) BP: (99-116)/(54-65) 110/59 mmHg (06/02 0340) SpO2:  [95 %-100 %] 100 % (06/02 0340) Weight:  [211 lb 3.2 oz (95.8 kg)] 211 lb 3.2 oz (95.8 kg) (06/02 0643)  Hemodynamic parameters for last 24 hours:    Intake/Output from previous day: 06/01 0701 - 06/02 0700 In: 91.1 [I.V.:91.1] Out: 680 [Urine:680] Intake/Output this shift:    General appearance: alert, cooperative and no distress Heart: irregularly irregular rhythm Lungs: dim in left lower fields Abdomen: mild distension, soft, non-tender Extremities: + LE edema Wound: incis healing well  Lab Results:  Recent Labs  09/01/13 0345 09/02/13 0416  WBC 16.2* 21.4*  HGB 8.0* 7.9*  HCT 23.4* 23.2*  PLT 384 412*   BMET:  Recent Labs  09/01/13 0345 09/02/13 0416  NA 132* 130*  K 4.4 5.2  CL 99 96  CO2 22 19  GLUCOSE 102* 153*  BUN 27* 32*  CREATININE 1.10 1.24  CALCIUM 7.6* 7.7*    PT/INR:  Recent Labs  09/02/13 0416  LABPROT 18.0*  INR 1.53*   ABG    Component Value Date/Time   PHART 7.395 08/29/2013 0049   HCO3 19.8* 08/29/2013 0049   TCO2 20 08/29/2013 1648   ACIDBASEDEF 4.0* 08/29/2013 0049   O2SAT 92.0 08/29/2013 0049   CBG (last 3)   Recent Labs  09/01/13 1604 09/01/13 2115  09/02/13 0619  GLUCAP 141* 160* 146*   Scheduled Meds: . acetaminophen  1,000 mg Oral 4 times per day  . amiodarone  400 mg Oral BID PC  . aspirin EC  81 mg Oral Daily  . bisacodyl  10 mg Oral Daily   Or  . bisacodyl  10 mg Rectal Daily  . docusate sodium  200 mg Oral Daily  . furosemide  40 mg Oral BID  . insulin aspart  0-15 Units Subcutaneous TID WC  . metoprolol tartrate  12.5 mg Oral BID  . mometasone-formoterol  2 puff Inhalation BID  . pantoprazole  40 mg Oral Daily  . potassium chloride  20 mEq Oral BID  . simvastatin  20 mg Oral q1800  . sodium chloride  3 mL Intravenous Q12H  . sodium chloride  3 mL Intravenous Q12H  . warfarin  2.5 mg Oral q1800  . warfarin   Does not apply Once  . Warfarin - Physician Dosing Inpatient   Does not apply q1800   Continuous Infusions:  PRN Meds:.sodium chloride, metoprolol, ondansetron (ZOFRAN) IV, oxyCODONE, sodium chloride, sodium chloride, traMADol, zolpidem  Dg Chest Port 1 View  09/01/2013   CLINICAL DATA:  Coronary bypass, follow up exam  EXAM: PORTABLE CHEST - 1 VIEW  COMPARISON:  08/31/2013  FINDINGS: Postop changes from coronary bypass. Stable cardiomegaly with mild  central vascular congestion. Small left pleural effusion with left basilar atelectasis/airspace disease persists. No pneumothorax. Stable right lung aeration. Right IJ vascular sheath is stable in position. Left fourth through seventh rib fractures noted.  IMPRESSION: Stable cardiomegaly with vascular congestion  Persistent small left effusion with left basilar atelectasis/airspace disease  Left fourth through seventh rib fractures.  No pneumothorax   Electronically Signed   By: Daryll Brod M.D.   On: 09/01/2013 07:48    Assessment/Plan: S/P Procedure(s) (LRB): CORONARY ARTERY BYPASS GRAFTING (CABG) x 3: LIMA-LAD, SVG-Ramus, SVG-Diagonal  (N/A) INTRAOPERATIVE TRANSESOPHAGEAL ECHOCARDIOGRAM (N/A) CLIPPING OF ATRIAL APPENDAGE (N/A) EVACUATION OF LEFT HEMOTHORAX  (Left)  1 urinary retention, has hx of previous prostate issues/ laser surgery. Will start flomax which he has been on in the past. May need foley and urologist consult 2 afib with controlled rate. QTc is> 500 so will decrease amiodarone dose. On coumadin- cont 2.5 mg for now 3 wean O2/ cont pulm toilet for atx.  4 Moderate volume overload persists, con BID lasix 5 Hct stable at 23- hold on transfusion for now- not sure he would tolerate Fe  At this time 6 sugars controlled, hold on po glucotrol for now     LOS: 15 days    John Giovanni 09/02/2013  I have seen and examined the patient and agree with the assessment and plan as outlined. Will check post-void residual and urine culture.  Needs more lasix.  Repeat WBC tomorrow.  Rexene Alberts 09/02/2013 2:44 PM

## 2013-09-02 NOTE — Progress Notes (Signed)
Pt provided bed offer list. Pt's unit CSW back tomorrow and will follow up and coordinate discharge plan.   Ky Barban, MSW, [place] Clinical Social Worker

## 2013-09-02 NOTE — Progress Notes (Signed)
09/02/2013 11:41 AM Nursing note  Pt. Bladder scan at 1100 showed 475 cc residual urine in bladder. Assisted patient to standing position and attempted to void with no result. Jadene Pierini [facility] paged and made aware. Verbal orders received I&O cath x1. Orders enacted. 500cc dark amber urine returned from bladder. Will continue to monitor patient.  [place]

## 2013-09-03 ENCOUNTER — Ambulatory Visit: Payer: Medicare Other

## 2013-09-03 LAB — PROTIME-INR
INR: 1.6 — ABNORMAL HIGH (ref 0.00–1.49)
Prothrombin Time: 18.6 seconds — ABNORMAL HIGH (ref 11.6–15.2)

## 2013-09-03 LAB — CBC
HCT: 22.9 % — ABNORMAL LOW (ref 39.0–52.0)
Hemoglobin: 7.7 g/dL — ABNORMAL LOW (ref 13.0–17.0)
MCH: 28 pg (ref 26.0–34.0)
MCHC: 33.6 g/dL (ref 30.0–36.0)
MCV: 83.3 fL (ref 78.0–100.0)
Platelets: 445 10*3/uL — ABNORMAL HIGH (ref 150–400)
RBC: 2.75 MIL/uL — ABNORMAL LOW (ref 4.22–5.81)
RDW: 15.8 % — ABNORMAL HIGH (ref 11.5–15.5)
WBC: 14.7 10*3/uL — ABNORMAL HIGH (ref 4.0–10.5)

## 2013-09-03 LAB — BASIC METABOLIC PANEL
BUN: 33 mg/dL — ABNORMAL HIGH (ref 6–23)
CO2: 20 mEq/L (ref 19–32)
Calcium: 7.8 mg/dL — ABNORMAL LOW (ref 8.4–10.5)
Chloride: 96 mEq/L (ref 96–112)
Creatinine, Ser: 1.22 mg/dL (ref 0.50–1.35)
GFR calc Af Amer: 69 mL/min — ABNORMAL LOW (ref >90)
GFR calc non Af Amer: 59 mL/min — ABNORMAL LOW (ref >90)
Glucose, Bld: 130 mg/dL — ABNORMAL HIGH (ref 70–99)
Potassium: 5.4 mEq/L — ABNORMAL HIGH (ref 3.7–5.3)
Sodium: 129 mEq/L — ABNORMAL LOW (ref 137–147)

## 2013-09-03 LAB — URINE CULTURE
Colony Count: NO GROWTH
Culture: NO GROWTH

## 2013-09-03 LAB — GLUCOSE, CAPILLARY
Glucose-Capillary: 127 mg/dL — ABNORMAL HIGH (ref 70–99)
Glucose-Capillary: 159 mg/dL — ABNORMAL HIGH (ref 70–99)
Glucose-Capillary: 146 mg/dL — ABNORMAL HIGH (ref 70–99)
Glucose-Capillary: 145 mg/dL — ABNORMAL HIGH (ref 70–99)

## 2013-09-03 LAB — PREPARE RBC (CROSSMATCH)

## 2013-09-03 LAB — PRO B NATRIURETIC PEPTIDE: Pro B Natriuretic peptide (BNP): 9288 pg/mL — ABNORMAL HIGH (ref 0–125)

## 2013-09-03 MED ORDER — FUROSEMIDE 10 MG/ML IJ SOLN
INTRAMUSCULAR | Status: AC
  Filled 2013-09-03: qty 4

## 2013-09-03 MED ORDER — FUROSEMIDE 10 MG/ML IJ SOLN
40.0000 mg | Freq: Three times a day (TID) | INTRAMUSCULAR | Status: AC
  Administered 2013-09-03 (×2): 40 mg via INTRAVENOUS
  Filled 2013-09-03: qty 4

## 2013-09-03 MED ORDER — POTASSIUM CHLORIDE CRYS ER 20 MEQ PO TBCR: 20.0000 meq | EXTENDED_RELEASE_TABLET | Freq: Two times a day (BID) | ORAL | Status: DC

## 2013-09-03 MED ORDER — FOLIC ACID 1 MG PO TABS
1.0000 mg | ORAL_TABLET | Freq: Every day | ORAL | Status: DC
  Administered 2013-09-03 – 2013-09-08 (×6): 1 mg via ORAL
  Filled 2013-09-03 (×6): qty 1

## 2013-09-03 MED ORDER — FUROSEMIDE 40 MG PO TABS
40.0000 mg | ORAL_TABLET | Freq: Two times a day (BID) | ORAL | Status: DC
  Filled 2013-09-03 (×3): qty 1

## 2013-09-03 NOTE — Progress Notes (Signed)
CARDIAC REHAB PHASE I   PRE:  Rate/Rhythm: 96 afib    BP: sitting 103/69    SaO2: 100 2L  Came to ambulate pt. He had just finished eating and did not have O2 on. Very dyspneic. Attempted to get SaO2 however it would not register until about 3 minutes after being placed on 2L (therefore did not see RA SaO2). He is 100 2L. Pt sts he feels very bad. Shaking, SOB, exhausted. Sts he just can't walk today. In the process of getting blood, VSS. Elevated pts feet and instructed not to go without O2 for now. Encouraged IS, which seems to exhaust pt as well. Encouraged walking this pm. Will f/u tomorrow. [place], ACSM 09/03/2013 1:42 PM

## 2013-09-03 NOTE — Progress Notes (Signed)
CSW went to speak to patient regarding bed offers. Patient states he would like a little more time to think about it. CSW encouraged patient to decide by this afternoon so social worker can lock down a SNF bed. Patient verbalized his understanding.  Jeanette Caprice, MSW, [place]

## 2013-09-03 NOTE — Progress Notes (Addendum)
Gerald Rosario       [place] [address]             [phone]      6 Days Post-Op Procedure(s) (LRB): CORONARY ARTERY BYPASS GRAFTING (CABG) x 3: LIMA-LAD, SVG-Ramus, SVG-Diagonal  (N/A) INTRAOPERATIVE TRANSESOPHAGEAL ECHOCARDIOGRAM (N/A) CLIPPING OF ATRIAL APPENDAGE (N/A) EVACUATION OF LEFT HEMOTHORAX (Left)  Subjective:  Mr. Anglin states he is doing okay.  He continues to have some weakness.  He required replacement of his foley catheter yesterday after being I/O cathed 3 times.  He also states he continues to feel swollen in his legs.  Objective: Vital signs in last 24 hours: Temp:  [98.4 F (36.9 C)-99.5 F (37.5 C)] 98.5 F (36.9 C) (06/03 0403) Pulse Rate:  [80-99] 86 (06/03 0403) Cardiac Rhythm:  [-] Atrial fibrillation (06/03 0720) Resp:  [18-20] 18 (06/03 0403) BP: (105-117)/(55-71) 113/62 mmHg (06/03 0403) SpO2:  [91 %-99 %] 95 % (06/03 0403) Weight:  [205 lb 7.5 oz (93.2 kg)] 205 lb 7.5 oz (93.2 kg) (06/03 0600)  Intake/Output from previous day: 06/02 0701 - 06/03 0700 In: 360 [P.O.:360] Out: 1000 [Urine:1000]  General appearance: alert, cooperative and no distress Heart: irregularly irregular rhythm Lungs: diminished breath sounds LLL Abdomen: soft, non-tender; bowel sounds normal; no masses,  no organomegaly Extremities: edema 2-3+ pitting Wound: clean and dry  Lab Results:  Recent Labs  09/02/13 0416 09/03/13 0435  WBC 21.4* 14.7*  HGB 7.9* 7.7*  HCT 23.2* 22.9*  PLT 412* 445*   BMET:  Recent Labs  09/02/13 0416 09/03/13 0435  NA 130* 129*  K 5.2 5.4*  CL 96 96  CO2 19 20  GLUCOSE 153* 130*  BUN 32* 33*  CREATININE 1.24 1.22  CALCIUM 7.7* 7.8*    PT/INR:  Recent Labs  09/03/13 0435  LABPROT 18.6*  INR 1.60*   ABG    Component Value Date/Time   PHART 7.395 08/29/2013 0049   HCO3 19.8* 08/29/2013 0049   TCO2 20 08/29/2013 1648   ACIDBASEDEF 4.0* 08/29/2013 0049   O2SAT 92.0 08/29/2013 0049   CBG (last 3)    Recent Labs  09/02/13 1636 09/02/13 2052 09/03/13 0624  GLUCAP 139* 135* 127*    Assessment/Plan: S/P Procedure(s) (LRB): CORONARY ARTERY BYPASS GRAFTING (CABG) x 3: LIMA-LAD, SVG-Ramus, SVG-Diagonal  (N/A) INTRAOPERATIVE TRANSESOPHAGEAL ECHOCARDIOGRAM (N/A) CLIPPING OF ATRIAL APPENDAGE (N/A) EVACUATION OF LEFT HEMOTHORAX (Left)  1. CV- A. Fib, continue Amiodarone and Lopressor 2. INR 1.60, will continue 2.5 mg daily 3. Pulm- wean oxygen as tolerated, encouraged use of IS 4. Renal- creatinine stable at 1.22, remains hypervolemic with marginal U/O- on IV Lasix, may benefit from a short course of Zaroxolyn with BNP > 9800, Hyperkalemic at 5.4, will hold K today 5. GU- Urinary retention, patient has no urge to void, required I/O cath 3 times yesterday, foley replaced, continue Flomax 6. Expected Acute Blood Loss Anemia- Hgb 7.7, remains relatively stable, however down from 7.9 7. ID- leukocytosis resolving, Urine culture is pending 8. DM- CBGS controlled 9. Dispo- patient slowly progressing, will d/c EPW this morning, need to diurese and wean oxygen prior to being ready for d/c to SNF   LOS: 16 days    Erin Barrett 09/03/2013  I have seen and examined the patient and agree with the assessment and plan as outlined.  Will give lasix IV today and transfuse 1 unit PRBC's for symptomatic anemia which continues to get worse.  May need d/c with  Foley catheter in place w/ plans to f/u with Urology as outpatient.  Rexene Alberts 09/03/2013 8:41 AM

## 2013-09-03 NOTE — Progress Notes (Signed)
09/03/2013 1450 EPW d/c per orders and per protocol. Ends intact. Pt. Tolerated well. Small amount of serous drainage noted from ventricular EPW site. Small gauze dressing applied. VS collected per protocol and patient advised of bed rest for one hour. Call bell within reach. Will continue to monitor. [place]

## 2013-09-03 NOTE — Progress Notes (Signed)
CSW spoke with patient regarding which facility he chose for SNF. Patient asked social worker if he could let social worker know by tomorrow morning. CSW encouraged patient to let social worker know by this afternoon or early tomorrow morning in order to secure a SNF bed. Patient verbalized his understanding. CSW continues to follow. FL2 on chart for MD signature.  Jeanette Caprice, MSW, [place]

## 2013-09-04 DIAGNOSIS — Z951 Presence of aortocoronary bypass graft: Secondary | ICD-10-CM

## 2013-09-04 LAB — TYPE AND SCREEN
ABO/RH(D): A POS
Antibody Screen: NEGATIVE
Unit division: 0

## 2013-09-04 LAB — CBC
HCT: 26 % — ABNORMAL LOW (ref 39.0–52.0)
Hemoglobin: 8.8 g/dL — ABNORMAL LOW (ref 13.0–17.0)
MCH: 28.2 pg (ref 26.0–34.0)
MCHC: 33.8 g/dL (ref 30.0–36.0)
MCV: 83.3 fL (ref 78.0–100.0)
Platelets: 516 10*3/uL — ABNORMAL HIGH (ref 150–400)
RBC: 3.12 MIL/uL — ABNORMAL LOW (ref 4.22–5.81)
RDW: 16.1 % — ABNORMAL HIGH (ref 11.5–15.5)
WBC: 16.1 10*3/uL — ABNORMAL HIGH (ref 4.0–10.5)

## 2013-09-04 LAB — PROTIME-INR
INR: 1.48 (ref 0.00–1.49)
Prothrombin Time: 17.5 seconds — ABNORMAL HIGH (ref 11.6–15.2)

## 2013-09-04 LAB — BASIC METABOLIC PANEL
BUN: 29 mg/dL — ABNORMAL HIGH (ref 6–23)
CO2: 22 mEq/L (ref 19–32)
Calcium: 8 mg/dL — ABNORMAL LOW (ref 8.4–10.5)
Chloride: 98 mEq/L (ref 96–112)
Creatinine, Ser: 1.11 mg/dL (ref 0.50–1.35)
GFR calc Af Amer: 77 mL/min — ABNORMAL LOW (ref >90)
GFR calc non Af Amer: 66 mL/min — ABNORMAL LOW (ref >90)
Glucose, Bld: 149 mg/dL — ABNORMAL HIGH (ref 70–99)
Potassium: 5.6 mEq/L — ABNORMAL HIGH (ref 3.7–5.3)
Sodium: 131 mEq/L — ABNORMAL LOW (ref 137–147)

## 2013-09-04 LAB — GLUCOSE, CAPILLARY
Glucose-Capillary: 155 mg/dL — ABNORMAL HIGH (ref 70–99)
Glucose-Capillary: 222 mg/dL — ABNORMAL HIGH (ref 70–99)
Glucose-Capillary: 116 mg/dL — ABNORMAL HIGH (ref 70–99)
Glucose-Capillary: 165 mg/dL — ABNORMAL HIGH (ref 70–99)

## 2013-09-04 MED ORDER — FUROSEMIDE 10 MG/ML IJ SOLN
10.0000 mg/h | INTRAVENOUS | Status: DC
  Administered 2013-09-04 – 2013-09-05 (×2): 10 mg/h via INTRAVENOUS
  Filled 2013-09-04 (×4): qty 25

## 2013-09-04 MED ORDER — WARFARIN SODIUM 5 MG PO TABS
5.0000 mg | ORAL_TABLET | Freq: Every day | ORAL | Status: DC
  Administered 2013-09-04 – 2013-09-07 (×4): 5 mg via ORAL
  Filled 2013-09-04 (×5): qty 1

## 2013-09-04 MED ORDER — FUROSEMIDE 10 MG/ML IJ SOLN
40.0000 mg | Freq: Three times a day (TID) | INTRAMUSCULAR | Status: DC
  Filled 2013-09-04: qty 4

## 2013-09-04 NOTE — Progress Notes (Addendum)
      [address]       [place] [address]             [phone]      7 Days Post-Op Procedure(s) (LRB): CORONARY ARTERY BYPASS GRAFTING (CABG) x 3: LIMA-LAD, SVG-Ramus, SVG-Diagonal  (N/A) INTRAOPERATIVE TRANSESOPHAGEAL ECHOCARDIOGRAM (N/A) CLIPPING OF ATRIAL APPENDAGE (N/A) EVACUATION OF LEFT HEMOTHORAX (Left)  Subjective:  Mr. Smick states he feels a little better after receiving blood yesterday.  He did not wear his oxygen during one of his meals yesterday and was very dyspneic.  Oxygen was placed back at 2L with sats of 100%.  He is ambulating with assistance.  + BM Objective: Vital signs in last 24 hours: Temp:  [97.9 F (36.6 C)-99.1 F (37.3 C)] 98.1 F (36.7 C) (06/04 0418) Pulse Rate:  [92-104] 92 (06/04 0418) Cardiac Rhythm:  [-] Atrial fibrillation (06/03 2045) Resp:  [16-18] 18 (06/04 0418) BP: (103-127)/(58-76) 119/73 mmHg (06/04 0418) SpO2:  [95 %-100 %] 100 % (06/04 0732)  Intake/Output from previous day: 06/03 0701 - 06/04 0700 In: 382.5 [P.O.:120; I.V.:250; Blood:12.5] Out: 1600 [Urine:1600]  General appearance: alert, cooperative and no distress Heart: irregularly irregular rhythm Lungs: clear to auscultation bilaterally Abdomen: soft, non-tender; bowel sounds normal; no masses,  no organomegaly Extremities: edema 2-3+ Wound: clean and dry  Lab Results:  Recent Labs  09/03/13 0435 09/04/13 0330  WBC 14.7* 16.1*  HGB 7.7* 8.8*  HCT 22.9* 26.0*  PLT 445* 516*   BMET:  Recent Labs  09/03/13 0435 09/04/13 0330  NA 129* 131*  K 5.4* 5.6*  CL 96 98  CO2 20 22  GLUCOSE 130* 149*  BUN 33* 29*  CREATININE 1.22 1.11  CALCIUM 7.8* 8.0*    PT/INR:  Recent Labs  09/04/13 0330  LABPROT 17.5*  INR 1.48   ABG    Component Value Date/Time   PHART 7.395 08/29/2013 0049   HCO3 19.8* 08/29/2013 0049   TCO2 20 08/29/2013 1648   ACIDBASEDEF 4.0* 08/29/2013 0049   O2SAT 92.0 08/29/2013 0049   CBG (last 3)   Recent Labs  09/03/13 1616 09/03/13 2106 09/04/13 0701  GLUCAP 146* 145* 155*    Assessment/Plan: S/P Procedure(s) (LRB): CORONARY ARTERY BYPASS GRAFTING (CABG) x 3: LIMA-LAD, SVG-Ramus, SVG-Diagonal  (N/A) INTRAOPERATIVE TRANSESOPHAGEAL ECHOCARDIOGRAM (N/A) CLIPPING OF ATRIAL APPENDAGE (N/A) EVACUATION OF LEFT HEMOTHORAX (Left)  1. CV- A. Fib continue Amiodarone and Lopressor 2. INR 1.48- has had 3 doses of Coumadin, will increase to 5 mg daily 3. Pulm- wean oxygen as tolerated, sats this morning 100% on 2L, decreased to 1 L and will wean as tolerated 4. Renal- creatinine remains stable at 1.11, good U/O with diuresis yesterday, weight came down 6 lbs, will repeat IV diuresis today 5. GU- urinary retention, foley in place, urine culture is negative to date 6. DM- CBGs controlled 7. Dispo- patient making progress, continued pitting LE edema however good results with diuresis yesterday, wean oxygen, will need SNF at discharge, likely not ready until Monday   LOS: 17 days    Erin Barrett 09/04/2013  I have seen and examined the patient and agree with the assessment and plan as outlined.  Will use lasix drip since he still has Foley cath in place.  Rexene Alberts 09/04/2013 8:05 AM

## 2013-09-04 NOTE — Progress Notes (Signed)
CARDIAC REHAB PHASE I   PRE:  Rate/Rhythm: 103 afib  BP:  Supine:   Sitting: 117/65  Standing:    SaO2: 100% 1L  MODE:  Ambulation: 350 ft   POST:  Rate/Rhythm: 121 afib  BP:  Supine:   Sitting: 129/62  Standing:    SaO2: 98% 1L after walk.  Would not register during walk 1012-1038 Pt walked 350 ft on 1L with rolling walker and asst x 1. Rocked and counted to three to stand. Loss of balance once during walk but tolerated well. Could not get oxygen sat to register during walk so kept on 1L. DOE noted. To recliner after walk. Pt proud of distance. Encouraged to walk two more times with staff.   Graylon Good, RN BSN  09/04/2013 10:34 AM

## 2013-09-04 NOTE — Progress Notes (Signed)
CSW Armed forces technical officer) spoke with pt about bed offers. At this time, pt has chosen to dc to [facility]. CSW called facility and notified of bed acceptance and possible dc Monday.  [place], [place]

## 2013-09-04 NOTE — Progress Notes (Signed)
Pt amb 350 ft using rolling walker with one rest break. SOB noted. Pt returned to chair with callbell in reach. Will continue to monitor pt closely.

## 2013-09-05 LAB — PROTIME-INR
INR: 1.49 (ref 0.00–1.49)
Prothrombin Time: 17.6 seconds — ABNORMAL HIGH (ref 11.6–15.2)

## 2013-09-05 LAB — GLUCOSE, CAPILLARY
Glucose-Capillary: 152 mg/dL — ABNORMAL HIGH (ref 70–99)
Glucose-Capillary: 169 mg/dL — ABNORMAL HIGH (ref 70–99)
Glucose-Capillary: 146 mg/dL — ABNORMAL HIGH (ref 70–99)
Glucose-Capillary: 131 mg/dL — ABNORMAL HIGH (ref 70–99)

## 2013-09-05 NOTE — Progress Notes (Addendum)
[address]       [place] [address]             [phone]      8 Days Post-Op Procedure(s) (LRB): CORONARY ARTERY BYPASS GRAFTING (CABG) x 3: LIMA-LAD, SVG-Ramus, SVG-Diagonal  (N/A) INTRAOPERATIVE TRANSESOPHAGEAL ECHOCARDIOGRAM (N/A) CLIPPING OF ATRIAL APPENDAGE (N/A) EVACUATION OF LEFT HEMOTHORAX (Left)  Subjective:  Gerald Rosario states he is feeling so much better today.  He states he still feels very swollen in his thighs and lower legs, but he does notice that it has been improving.  He ambulated yesterday with assistance.  The patient again remained on oxygen this morning with sats of 100% on 1 L.  Objective: Vital signs in last 24 hours: Temp:  [97.4 F (36.3 C)-98.7 F (37.1 C)] 98.7 F (37.1 C) (06/05 0650) Pulse Rate:  [88-102] 96 (06/05 0650) Cardiac Rhythm:  [-] Atrial fibrillation (06/04 2045) Resp:  [18-19] 18 (06/05 0650) BP: (100-109)/(54-64) 106/64 mmHg (06/05 0650) SpO2:  [94 %-100 %] 98 % (06/05 0650) Weight:  [192 lb 0.3 oz (87.1 kg)-198 lb 1.6 oz (89.858 kg)] 192 lb 0.3 oz (87.1 kg) (06/05 0451)  Intake/Output from previous day: 06/04 0701 - 06/05 0700 In: -  Out: 4650 [Urine:4650]  General appearance: alert, cooperative and no distress Heart: irregularly irregular rhythm Lungs: clear to auscultation bilaterally Abdomen: soft, non-tender; bowel sounds normal; no masses,  no organomegaly Extremities: edema 1-2+ pitting, improving Wound: clean and dry  Lab Results:  Recent Labs  09/03/13 0435 09/04/13 0330  WBC 14.7* 16.1*  HGB 7.7* 8.8*  HCT 22.9* 26.0*  PLT 445* 516*   BMET:  Recent Labs  09/03/13 0435 09/04/13 0330  NA 129* 131*  K 5.4* 5.6*  CL 96 98  CO2 20 22  GLUCOSE 130* 149*  BUN 33* 29*  CREATININE 1.22 1.11  CALCIUM 7.8* 8.0*    PT/INR:  Recent Labs  09/05/13 0450  LABPROT 17.6*  INR 1.49   ABG    Component Value Date/Time   PHART 7.395 08/29/2013 0049   HCO3 19.8* 08/29/2013 0049   TCO2 20 08/29/2013 1648   ACIDBASEDEF 4.0* 08/29/2013 0049   O2SAT 92.0 08/29/2013 0049   CBG (last 3)   Recent Labs  09/04/13 1621 09/04/13 2212 09/05/13 0648  GLUCAP 116* 165* 152*    Assessment/Plan: S/P Procedure(s) (LRB): CORONARY ARTERY BYPASS GRAFTING (CABG) x 3: LIMA-LAD, SVG-Ramus, SVG-Diagonal  (N/A) INTRAOPERATIVE TRANSESOPHAGEAL ECHOCARDIOGRAM (N/A) CLIPPING OF ATRIAL APPENDAGE (N/A) EVACUATION OF LEFT HEMOTHORAX (Left)  1. CV- A. Fib, continue Lopressor, Amiodarone 2. INR- 1.49 will repeat Coumadin dose of 5 mg 3. Pulm- d/c oxygen, need to check sats with ambulation, however patient should not need oxygen, good use of IS 4. Renal- remains volume overloaded, good results with Lasix drip, weight is down 6lbs from yesterday, continue diuresis, repeat BMET in AM 5. GU- urinary rentention, history of prostate problems, foley in place 6. DM- CBGs controlled 7. Dispo- improvement in LE edema, remains volume overloaded will continue Lasix, off oxygen, should be ready for SNF on Monday   LOS: 18 days    Erin Barrett 09/05/2013  I have seen and examined the patient and agree with the assessment and plan as outlined.  Gerald Rosario is doing much better.  Will continue lasix drip today and stop it tomorrow.  Try to d/c Foley tomorrow.  If he fails Foley removal again he will need to be discharged with Foley and leg  bag and prophylactic antibiotics with plans to f/u with his Urologist in the office.  Rexene Alberts 09/05/2013 9:22 AM

## 2013-09-05 NOTE — Progress Notes (Signed)
Pt ambulated approximately 600 ft accompanied by RN using front wheel walker. Pt tolerated walk well and stopped X's 1 to check pulse oximetry which was 94 percent. Pt currently resting in chair with call bell within reach. Will continue to monitor.   [name]

## 2013-09-05 NOTE — Progress Notes (Signed)
Pt amb 750 ft pushing walker. Some SOB noted. O2 sats 98% upon returning to room. Pt placed back in chair with call bell in reach. Will continue to monitor pt closely.

## 2013-09-05 NOTE — Progress Notes (Signed)
09/05/2013 1530 Additional Medicare IM given, placed on chart. Jonnie Finner RN CCM [phone]

## 2013-09-05 NOTE — Progress Notes (Signed)
CARDIAC REHAB PHASE I   PRE:  Rate/Rhythm: 93 afib    BP: sitting 115/56    SaO2: 97 RA  MODE:  Ambulation: 690 ft   POST:  Rate/Rhythm: 120s afib    BP: sitting 120/67     SaO2: 95 RA  Pt much improved. Doubled distance on RA. Rest x2 to check SaO2 and for DOE. Steady. Tired after walk, return to recliner. Wants to walk x2 more today. [phone]   [place], ACSM 09/05/2013 10:05 AM
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
## 2013-09-06 LAB — PROTIME-INR
INR: 1.83 — ABNORMAL HIGH (ref 0.00–1.49)
Prothrombin Time: 20.6 seconds — ABNORMAL HIGH (ref 11.6–15.2)

## 2013-09-06 LAB — GLUCOSE, CAPILLARY
Glucose-Capillary: 143 mg/dL — ABNORMAL HIGH (ref 70–99)
Glucose-Capillary: 197 mg/dL — ABNORMAL HIGH (ref 70–99)
Glucose-Capillary: 132 mg/dL — ABNORMAL HIGH (ref 70–99)
Glucose-Capillary: 122 mg/dL — ABNORMAL HIGH (ref 70–99)

## 2013-09-06 LAB — CBC
HCT: 27.9 % — ABNORMAL LOW (ref 39.0–52.0)
Hemoglobin: 9 g/dL — ABNORMAL LOW (ref 13.0–17.0)
MCH: 26.8 pg (ref 26.0–34.0)
MCHC: 32.3 g/dL (ref 30.0–36.0)
MCV: 83 fL (ref 78.0–100.0)
Platelets: 555 10*3/uL — ABNORMAL HIGH (ref 150–400)
RBC: 3.36 MIL/uL — ABNORMAL LOW (ref 4.22–5.81)
RDW: 15.5 % (ref 11.5–15.5)
WBC: 14.3 10*3/uL — ABNORMAL HIGH (ref 4.0–10.5)

## 2013-09-06 LAB — BASIC METABOLIC PANEL
BUN: 24 mg/dL — ABNORMAL HIGH (ref 6–23)
CO2: 26 mEq/L (ref 19–32)
Calcium: 8.3 mg/dL — ABNORMAL LOW (ref 8.4–10.5)
Chloride: 92 mEq/L — ABNORMAL LOW (ref 96–112)
Creatinine, Ser: 1.1 mg/dL (ref 0.50–1.35)
GFR calc Af Amer: 78 mL/min — ABNORMAL LOW (ref >90)
GFR calc non Af Amer: 67 mL/min — ABNORMAL LOW (ref >90)
Glucose, Bld: 134 mg/dL — ABNORMAL HIGH (ref 70–99)
Potassium: 5 mEq/L (ref 3.7–5.3)
Sodium: 130 mEq/L — ABNORMAL LOW (ref 137–147)

## 2013-09-06 LAB — PRO B NATRIURETIC PEPTIDE: Pro B Natriuretic peptide (BNP): 5869 pg/mL — ABNORMAL HIGH (ref 0–125)

## 2013-09-06 MED ORDER — METOPROLOL TARTRATE 12.5 MG HALF TABLET
12.5000 mg | ORAL_TABLET | Freq: Two times a day (BID) | ORAL | Status: DC
  Administered 2013-09-06 – 2013-09-08 (×3): 12.5 mg via ORAL
  Filled 2013-09-06 (×5): qty 1

## 2013-09-06 MED ORDER — FUROSEMIDE 80 MG PO TABS
80.0000 mg | ORAL_TABLET | Freq: Every day | ORAL | Status: DC
  Administered 2013-09-06: 80 mg via ORAL
  Filled 2013-09-06 (×2): qty 1

## 2013-09-06 NOTE — Progress Notes (Signed)
Foley d/c'ed per MD order and protocol. Pt tolerated procedure well. Pt instructed to void in urinal when he feels the urge to void. Will continue to monitor.

## 2013-09-06 NOTE — Progress Notes (Signed)
CARDIAC REHAB PHASE I   PRE:  Rate/Rhythm: 87 afib    BP: sitting 88/52    SaO2: 96 RA  MODE:  Ambulation: 970 ft   POST:  Rate/Rhythm: 133 afib    BP: sitting 133/72     SaO2: 97 RA  Tolerated very well with RW. Steady. Struggles to stand independently but this is improving. Increased distance and pace. Pt very proud of himself. HR elevated upon return to room, decreased quickly with rest. [place], ACSM 09/06/2013 8:57 AM

## 2013-09-06 NOTE — Progress Notes (Signed)
RN bladder scanned pt sitting in chair pt had 471 ml remaining in bladder. PT wanted to try to urinate once more before RN paged MD. Pt urinated 175 ml. RN re-scanned pt in lying in bed. Pt had 485 ml remaining in bladder. RN paged MD. Orders given to play a foley catheter. Pt tolerated procedure well. Pt currently resting in chair with call bell within reach. Will continue to monitor.   [name]

## 2013-09-06 NOTE — Progress Notes (Signed)
Pt ambulated in hallway 800 ft with rolling walker and tolerated activity well. Pt took several standing breaks.  Pt assisted to bedside chair with call bell within reach. Will continue to monitor.

## 2013-09-06 NOTE — Progress Notes (Signed)
       [address]       [place] [address]             [phone]          9 Days Post-Op Procedure(s) (LRB): CORONARY ARTERY BYPASS GRAFTING (CABG) x 3: LIMA-LAD, SVG-Ramus, SVG-Diagonal  (N/A) INTRAOPERATIVE TRANSESOPHAGEAL ECHOCARDIOGRAM (N/A) CLIPPING OF ATRIAL APPENDAGE (N/A) EVACUATION OF LEFT HEMOTHORAX (Left)  Subjective: Feels well, no complaints.   Objective: Vital signs in last 24 hours: Patient Vitals for the past 24 hrs:  BP Temp Temp src Pulse Resp SpO2 Weight  09/06/13 0738 - - - - - 98 % -  09/06/13 0644 107/70 mmHg 97.9 F (36.6 C) Oral 90 18 96 % 186 lb 15.2 oz (84.8 kg)  09/05/13 2145 96/60 mmHg 97.6 F (36.4 C) Oral 96 18 96 % -  09/05/13 1500 94/60 mmHg 97.9 F (36.6 C) Oral 92 18 96 % -  09/05/13 1052 101/62 mmHg - - 95 - - -   Current Weight  09/06/13 186 lb 15.2 oz (84.8 kg)  BASELINE WEIGHT: 81 kg    Intake/Output from previous day: 06/05 0701 - 06/06 0700 In: 360 [P.O.:360] Out: 2475 [Urine:2475]  CBGs 131-134  PHYSICAL EXAM:  Heart: Irr irr Lungs: Few fine crackles in bases Wound: Clean and dry Extremities: +LE edema, L>R    Lab Results: CBC: Recent Labs  09/04/13 0330 09/06/13 0420  WBC 16.1* 14.3*  HGB 8.8* 9.0*  HCT 26.0* 27.9*  PLT 516* 555*   BMET:  Recent Labs  09/04/13 0330 09/06/13 0420  NA 131* 130*  K 5.6* 5.0  CL 98 92*  CO2 22 26  GLUCOSE 149* 134*  BUN 29* 24*  CREATININE 1.11 1.10  CALCIUM 8.0* 8.3*    PT/INR:  Recent Labs  09/06/13 0420  LABPROT 20.6*  INR 1.83*      Assessment/Plan: S/P Procedure(s) (LRB): CORONARY ARTERY BYPASS GRAFTING (CABG) x 3: LIMA-LAD, SVG-Ramus, SVG-Diagonal  (N/A) INTRAOPERATIVE TRANSESOPHAGEAL ECHOCARDIOGRAM (N/A) CLIPPING OF ATRIAL APPENDAGE (N/A) EVACUATION OF LEFT HEMOTHORAX (Left)  CV- rate controlled AF, BPs low normal. Continue Lopressor, Amio, Coumadin.  Vol overload- will convert Lasix from IV to po and watch vol  status.  Urinary retention- voiding trial today.  If he fails Foley removal again he will need to be discharged with Foley and leg bag and prophylactic antibiotics with plans to f/u with his Urologist in the office.  CRPI, pulm toilet.  Disp- hopefully to SNF on Monday.   LOS: 19 days    [name] 09/06/2013

## 2013-09-06 NOTE — Progress Notes (Signed)
Pt ambulated approximately 1500 ft accompanied by RN using front wheel walker. Pt stopped X's 2 to catch his breath, otherwise pt tolerated walk well. Pt currently resting in chair with call bell within reach. Will continue to monitor.   [name]

## 2013-09-07 LAB — PROTIME-INR
INR: 2.18 — ABNORMAL HIGH (ref 0.00–1.49)
Prothrombin Time: 23.6 seconds — ABNORMAL HIGH (ref 11.6–15.2)

## 2013-09-07 LAB — GLUCOSE, CAPILLARY
Glucose-Capillary: 156 mg/dL — ABNORMAL HIGH (ref 70–99)
Glucose-Capillary: 155 mg/dL — ABNORMAL HIGH (ref 70–99)
Glucose-Capillary: 145 mg/dL — ABNORMAL HIGH (ref 70–99)
Glucose-Capillary: 139 mg/dL — ABNORMAL HIGH (ref 70–99)

## 2013-09-07 MED ORDER — FUROSEMIDE 80 MG PO TABS
80.0000 mg | ORAL_TABLET | Freq: Two times a day (BID) | ORAL | Status: DC
  Administered 2013-09-07 – 2013-09-08 (×3): 80 mg via ORAL
  Filled 2013-09-07 (×5): qty 1

## 2013-09-07 NOTE — Progress Notes (Signed)
Pt refused 3rd walk of the day. Pt stated " I had a rough day today, I want to take it easy tonight". Pt currently resting in chair with call bell within reach. Will continue to monitor.  [name], RN

## 2013-09-07 NOTE — Progress Notes (Signed)
CT sutures removed Per MD order and protocol. Pt tolerated procedure well. Sterri strips applied. Will continue to monitor.

## 2013-09-07 NOTE — Progress Notes (Signed)
Pt ambulated in hallway 900 ft with rolling walker on room air. Pt tolerated activity well. Pt's oxygen sats were 94-98% on room air while ambulating. Pt to bedside chair with call bell within reach. Will continue to monitor.

## 2013-09-07 NOTE — Progress Notes (Signed)
Pt ambulated in hallway 900 ft with rolling walker on room air and tolerated activity well. Pt's oxygen sats were 96-97 % while ambulating. Pt now resting in chair with call bell within reach. Will continue to monitor.

## 2013-09-07 NOTE — Progress Notes (Addendum)
       [address]       Gerald Rosario,Gerald Rosario [address]             [phone]          10 Days Post-Op Procedure(s) (LRB): CORONARY ARTERY BYPASS GRAFTING (CABG) x 3: LIMA-LAD, SVG-Ramus, SVG-Diagonal  (N/A) INTRAOPERATIVE TRANSESOPHAGEAL ECHOCARDIOGRAM (N/A) CLIPPING OF ATRIAL APPENDAGE (N/A) EVACUATION OF LEFT HEMOTHORAX (Left)  Subjective: Failed voiding trial last night and Foley replaced.  Sore in chest this am after using arms to get OOB, otherwise feels well.   Objective: Vital signs in last 24 hours: Patient Vitals for the past 24 hrs:  BP Temp Temp src Pulse Resp SpO2 Weight  09/07/13 0500 105/56 mmHg 97.4 F (36.3 C) Oral 91 18 99 % 204 lb 12.9 oz (92.9 kg)  09/06/13 2147 - - - 104 18 93 % -  09/06/13 2146 84/53 mmHg 97.1 F (36.2 C) Oral 97 18 99 % -  09/06/13 1442 104/62 mmHg 97.5 F (36.4 C) Oral 89 17 99 % -  09/06/13 1001 94/53 mmHg - - 100 - - -   Current Weight  09/07/13 204 lb 12.9 oz (92.9 kg)  BASELINE WEIGHT: 81 kg    Intake/Output from previous day: 06/06 0701 - 06/07 0700 In: 840 [P.O.:840] Out: 3050 [Urine:3050]  CBGs 122-156   PHYSICAL EXAM:  Heart: irr irr Lungs: Fine crackles in bases Wound: Clean and dry, no sternal instability Extremities: +LE edema    Lab Results: CBC: Recent Labs  09/06/13 0420  WBC 14.3*  HGB 9.0*  HCT 27.9*  PLT 555*   BMET:  Recent Labs  09/06/13 0420  NA 130*  K 5.0  CL 92*  CO2 26  GLUCOSE 134*  BUN 24*  CREATININE 1.10  CALCIUM 8.3*    PT/INR:  Recent Labs  09/07/13 0349  LABPROT 23.6*  INR 2.18*      Assessment/Plan: S/P Procedure(s) (LRB): CORONARY ARTERY BYPASS GRAFTING (CABG) x 3: LIMA-LAD, SVG-Ramus, SVG-Diagonal  (N/A) INTRAOPERATIVE TRANSESOPHAGEAL ECHOCARDIOGRAM (N/A) CLIPPING OF ATRIAL APPENDAGE (N/A) EVACUATION OF LEFT HEMOTHORAX (Left) CV- rate controlled AF, BPs low normal. Continue Lopressor, Amio, Coumadin.  Vol overload- wt reportedly up 8 kg  since yesterday, but pt does not appear to be significantly more edematous today.  Diuresed >3L over last 24 hours. Will continue po Lasix and monitor. Urinary retention- Foley replaced and Flomax started.  He will need to be discharged with Foley and leg bag and prophylactic antibiotics with plans to f/u with his Urologist in the office.  CRPI, pulm toilet.  Disp- hopefully to SNF on Monday.    LOS: 20 days    Gerald Rosario 09/07/2013  Post void residual 500 cc, doing well the Foley indwelling catheter now Walking well Should be rate for transfer to skilled nursing facility tomorrow Flomax started

## 2013-09-08 LAB — GLUCOSE, CAPILLARY
Glucose-Capillary: 135 mg/dL — ABNORMAL HIGH (ref 70–99)
Glucose-Capillary: 158 mg/dL — ABNORMAL HIGH (ref 70–99)

## 2013-09-08 LAB — PROTIME-INR
INR: 2.45 — ABNORMAL HIGH (ref 0.00–1.49)
Prothrombin Time: 25.8 seconds — ABNORMAL HIGH (ref 11.6–15.2)

## 2013-09-08 MED ORDER — FUROSEMIDE 80 MG PO TABS: 80.0000 mg | ORAL_TABLET | Freq: Two times a day (BID) | ORAL | Status: DC

## 2013-09-08 MED ORDER — WARFARIN SODIUM 4 MG PO TABS: 4.0000 mg | ORAL_TABLET | Freq: Every day | ORAL | Status: DC

## 2013-09-08 MED ORDER — TAMSULOSIN HCL 0.4 MG PO CAPS: 0.4000 mg | ORAL_CAPSULE | Freq: Every day | ORAL | Status: AC

## 2013-09-08 MED ORDER — METOPROLOL TARTRATE 25 MG PO TABS: 12.5000 mg | ORAL_TABLET | Freq: Two times a day (BID) | ORAL | Status: DC

## 2013-09-08 MED ORDER — WARFARIN SODIUM 4 MG PO TABS
4.0000 mg | ORAL_TABLET | Freq: Every day | ORAL | Status: DC
  Filled 2013-09-08: qty 1

## 2013-09-08 MED ORDER — AMIODARONE HCL 200 MG PO TABS: 200.0000 mg | ORAL_TABLET | Freq: Two times a day (BID) | ORAL | Status: DC

## 2013-09-08 MED ORDER — FERROUS SULFATE 325 (65 FE) MG PO TABS: 325.0000 mg | ORAL_TABLET | Freq: Every day | ORAL | Status: DC

## 2013-09-08 NOTE — Progress Notes (Addendum)
Pt given discharge instructions, medication lists, follow up appointments, and when to call the doctor.  Pt verbalizes understanding. Pt given signs and symptoms of infection. Pt transferred to discharge lounge to await pick up to go to SNF. Attached leg bag for foley catheter. Payton Emerald, RN

## 2013-09-08 NOTE — Progress Notes (Signed)
Patient has a bed at [facility] CSW spoke to patient and patient states he does not have a ride over and would like Education officer, museum to set up EMS transport. CSW awaiting DC orders by MD.  Jeanette Caprice, MSW, [place]

## 2013-09-08 NOTE — Progress Notes (Signed)
[id number] Cardiac Rehab Completed discharge education with pt. He voices understanding. Pt agrees to Organ. CRP in [place], will send referral. Deon Pilling, RN 09/08/2013 9:17 AM

## 2013-09-08 NOTE — Progress Notes (Signed)
Clinical Social Work Department CLINICAL SOCIAL WORK PLACEMENT NOTE 09/08/2013  Patient:  Gerald Rosario, Gerald Rosario  Account Number:  [id number] Admit date:  08/18/2013  Clinical Social Worker:  Barbette Or, LCSW  Date/time:  08/26/2013 12:00 N  Clinical Social Work is seeking post-discharge placement for this patient at the following level of care:   SKILLED NURSING   (*CSW will update this form in Epic as items are completed)   08/26/2013  Patient/family provided with [place] Department of Clinical Social Work's list of facilities offering this level of care within the geographic area requested by the patient (or if unable, by the patient's family).  08/26/2013  Patient/family informed of their freedom to choose among providers that offer the needed level of care, that participate in Medicare, Medicaid or managed care program needed by the patient, have an available bed and are willing to accept the patient.  08/26/2013  Patient/family informed of [facility]' ownership interest in [facility], as well as of the fact that they are under no obligation to receive care at this facility.  PASARR submitted to EDS on 08/25/2013 PASARR number received on 08/25/2013  FL2 transmitted to all facilities in geographic area requested by pt/family on  08/26/2013 FL2 transmitted to all facilities within larger geographic area on   Patient informed that his/her managed care company has contracts with or will negotiate with  certain facilities, including the following:     Patient/family informed of bed offers received:  09/08/2013 Patient chooses bed at [place] Physician recommends and patient chooses bed at    Patient to be transferred to [place] on  09/08/2013 Patient to be transferred to facility by [facility] Patient and family notified of transfer on  Name of family member notified:    The following physician request were entered in  Epic:   Additional Comments:  Jeanette Caprice, MSW, [place]

## 2013-09-08 NOTE — Progress Notes (Signed)
Orthopedic Tech Progress Note Patient Details:  [name] [birth year] [id number]  Ortho Devices Type of Ortho Device: Finger splint Ortho Device/Splint Interventions: Application   Theodoro Parma Cammer 09/08/2013, 11:18 AM

## 2013-09-08 NOTE — Progress Notes (Signed)
[place] and gave report to RN getting pt. All questions answered. Payton Emerald, RN

## 2013-09-08 NOTE — Progress Notes (Addendum)
Clinical Social Worker facilitated patient discharge including contacting patient and facility to confirm patient discharge plans.  Clinical information faxed to facility agreeable with plan. Patient states he already spoke to daughter and informed of dc so there was no need for social worker to call. Patient stated he has a ride from his neighbor for this afternoon to [place] [facility] [employer].  RN to call report prior to discharge.  Clinical Social Worker will sign off for now as social work intervention is no longer needed. Please consult [place] again if new need arises.  Jeanette Caprice, MSW, [place]

## 2013-09-08 NOTE — Progress Notes (Addendum)
      [address]       [place] [address]             [phone]      11 Days Post-Op Procedure(s) (LRB): CORONARY ARTERY BYPASS GRAFTING (CABG) x 3: LIMA-LAD, SVG-Ramus, SVG-Diagonal  (N/A) INTRAOPERATIVE TRANSESOPHAGEAL ECHOCARDIOGRAM (N/A) CLIPPING OF ATRIAL APPENDAGE (N/A) EVACUATION OF LEFT HEMOTHORAX (Left)  Subjective:  [name] states he is feeling better.  He does however continue to complain of weakness and he has difficulty getting out of a chair due to leg weakness.  He is ready to go to rehab Objective: Vital signs in last 24 hours: Temp:  [97.8 F (36.6 C)-98.4 F (36.9 C)] 98.4 F (36.9 C) (06/08 0418) Pulse Rate:  [76-90] 86 (06/08 0418) Cardiac Rhythm:  [-] Atrial fibrillation (06/07 2046) Resp:  [17-19] 17 (06/08 0418) BP: (97-118)/(52-69) 118/69 mmHg (06/08 0418) SpO2:  [93 %-100 %] 97 % (06/08 0418) Weight:  [182 lb (82.555 kg)] 182 lb (82.555 kg) (06/08 0459)  Intake/Output from previous day: 06/07 0701 - 06/08 0700 In: 360 [P.O.:360] Out: 2600 [Urine:2600]  General appearance: alert, cooperative and no distress Heart: irregularly irregular rhythm Lungs: clear to auscultation bilaterally Abdomen: soft, non-tender; bowel sounds normal; no masses,  no organomegaly Extremities: edema continued pitting edema, significant improvement Wound: clean and dryu  Lab Results:  Recent Labs  09/06/13 0420  WBC 14.3*  HGB 9.0*  HCT 27.9*  PLT 555*   BMET:  Recent Labs  09/06/13 0420  NA 130*  K 5.0  CL 92*  CO2 26  GLUCOSE 134*  BUN 24*  CREATININE 1.10  CALCIUM 8.3*    PT/INR:  Recent Labs  09/08/13 0505  LABPROT 25.8*  INR 2.45*   ABG    Component Value Date/Time   PHART 7.395 08/29/2013 0049   HCO3 19.8* 08/29/2013 0049   TCO2 20 08/29/2013 1648   ACIDBASEDEF 4.0* 08/29/2013 0049   O2SAT 92.0 08/29/2013 0049   CBG (last 3)   Recent Labs  09/07/13 1614 09/07/13 2104 09/08/13 0609  GLUCAP 145* 139* 135*     Assessment/Plan: S/P Procedure(s) (LRB): CORONARY ARTERY BYPASS GRAFTING (CABG) x 3: LIMA-LAD, SVG-Ramus, SVG-Diagonal  (N/A) INTRAOPERATIVE TRANSESOPHAGEAL ECHOCARDIOGRAM (N/A) CLIPPING OF ATRIAL APPENDAGE (N/A) EVACUATION OF LEFT HEMOTHORAX (Left)  1. CV- rate controlled A. Fib- continue Lopressor, Amiodarone 2. INR 2.45, will decrease Coumadin to 4 mg daily 3. Renal- hypervolemia improving, weight is up 4 lbs from admission, continued LE edema, will continue Lasix 4. GU- urinary retention, foley in place on Flomax, patient will be discharged with foley in place and to follow up with Dr. Junious Silk 5. Deconditioning- SNF 6. Dispo- patient is doing well, his LE remain edematous and patient was instructed on importance of not dangling legs when sitting, will continue Lasix at discharge, INR therapeutic, to SNF today if bed available   LOS: 21 days    Erin Barrett 09/08/2013  I have seen and examined the patient and agree with the assessment and plan as outlined.  Mr Cuff looks ready for d/c to SNF.  He needs splint on right 5th finger which he injured during his fall at the time of original presentation.  Rexene Alberts 09/08/2013 8:26 AM

## 2013-09-12 ENCOUNTER — Encounter: Payer: Self-pay | Admitting: Internal Medicine

## 2013-09-15 ENCOUNTER — Encounter (INDEPENDENT_AMBULATORY_CARE_PROVIDER_SITE_OTHER): Payer: Medicare Other | Admitting: Ophthalmology

## 2013-09-15 DIAGNOSIS — E1139 Type 2 diabetes mellitus with other diabetic ophthalmic complication: Secondary | ICD-10-CM

## 2013-09-15 DIAGNOSIS — E1165 Type 2 diabetes mellitus with hyperglycemia: Secondary | ICD-10-CM

## 2013-09-15 DIAGNOSIS — H35039 Hypertensive retinopathy, unspecified eye: Secondary | ICD-10-CM

## 2013-09-15 DIAGNOSIS — I10 Essential (primary) hypertension: Secondary | ICD-10-CM

## 2013-09-15 DIAGNOSIS — H43819 Vitreous degeneration, unspecified eye: Secondary | ICD-10-CM

## 2013-09-15 DIAGNOSIS — E11319 Type 2 diabetes mellitus with unspecified diabetic retinopathy without macular edema: Secondary | ICD-10-CM

## 2013-09-15 DIAGNOSIS — E11311 Type 2 diabetes mellitus with unspecified diabetic retinopathy with macular edema: Secondary | ICD-10-CM

## 2013-09-25 ENCOUNTER — Other Ambulatory Visit: Payer: Self-pay | Admitting: Thoracic Surgery (Cardiothoracic Vascular Surgery)

## 2013-09-25 DIAGNOSIS — I214 Non-ST elevation (NSTEMI) myocardial infarction: Secondary | ICD-10-CM

## 2013-09-26 NOTE — OR Nursing (Signed)
Addendum to scope page and wound class.  

## 2013-09-26 NOTE — Progress Notes (Signed)
Loop recorder 

## 2013-09-29 ENCOUNTER — Ambulatory Visit (INDEPENDENT_AMBULATORY_CARE_PROVIDER_SITE_OTHER): Payer: Self-pay | Admitting: Physician Assistant

## 2013-09-29 ENCOUNTER — Ambulatory Visit
Admission: RE | Admit: 2013-09-29 | Discharge: 2013-09-29 | Disposition: A | Discharge status: Home / Self Care | Payer: Medicare Other | Source: Ambulatory Visit | Attending: Thoracic Surgery (Cardiothoracic Vascular Surgery) | Admitting: Thoracic Surgery (Cardiothoracic Vascular Surgery)

## 2013-09-29 VITALS — BP 94/58 | HR 70 | Resp 20 | Ht 67.0 in | Wt 182.0 lb

## 2013-09-29 DIAGNOSIS — I214 Non-ST elevation (NSTEMI) myocardial infarction: Secondary | ICD-10-CM

## 2013-09-29 DIAGNOSIS — I25810 Atherosclerosis of coronary artery bypass graft(s) without angina pectoris: Secondary | ICD-10-CM

## 2013-09-29 DIAGNOSIS — Z951 Presence of aortocoronary bypass graft: Secondary | ICD-10-CM

## 2013-09-29 NOTE — Progress Notes (Signed)
[address]       [place] [address]             [phone]          HPI: Patient returns for routine postoperative follow-up having undergone CABG x 3, evacuation of left hemothorax and clipping of left atrial appendage by Dr. Roxy Manns on 08/28/2013. The patient's postoperative course was notable for atrial fibrillation, which converted to sinus rhythm on Amiodarone and Coumadin.  He was significantly volume overloaded, requiring aggressive diuresis.  He also developed urinary retention and required replacement of a Foley catheter and initiation of Flomax.  He later failed a voiding trial, and was discharged with a leg bag and asked to follow up with urology.  He was discharged to [facility] for a brief skilled nursing stay prior to returning home.  Since hospital discharge, the patient has continued to progress.  He was seen in the urology office, and it was elected to leave his Foley in place for an additional week.  He did have the catheter removed last week, and has been voiding without difficulty since then.  His INRs have remained stable on 4 mg of Coumadin, with his last INR at 2.12 last week, followed by [facility].  He has a follow up appointment scheduled next week with Dr. [name]. The patient is progressing well with physical therapy, and was discharged today from SNF.  His daughter, who is an ICU nurse in [place], will be assisting him for a while during the transition.  He denies any chest pain or shortness of breath, and has noted a significant improvement in his weight and lower extremity edema.      Current Outpatient Prescriptions  Medication Sig Dispense Refill  . amiodarone (PACERONE) 200 MG tablet Take 1 tablet (200 mg total) by mouth 2 (two) times daily after a meal.      . aspirin 81 MG tablet Take 1 tablet (81 mg total) by mouth every morning.  30 tablet    . ferrous sulfate 325 (65 FE) MG tablet Take 1 tablet (325 mg total) by mouth  daily with breakfast.    3  . fish oil-omega-3 fatty acids 1000 MG capsule Take 1 g by mouth daily.        . fluticasone-salmeterol (ADVAIR HFA) 115-21 MCG/ACT inhaler Inhale 2 puffs into the lungs 2 (two) times daily.       . furosemide (LASIX) 80 MG tablet Take 1 tablet (80 mg total) by mouth 2 (two) times daily.      Marland Kitchen glipiZIDE (GLUCOTROL) 10 MG tablet Take 10 mg by mouth daily before breakfast.      . Glucosamine-Chondroitin (OSTEO BI-FLEX REGULAR STRENGTH PO) Take 1 capsule by mouth daily.      . metoprolol tartrate (LOPRESSOR) 25 MG tablet Take 0.5 tablets (12.5 mg total) by mouth 2 (two) times daily.      . Multiple Vitamin (MULTIVITAMIN) tablet Take 1 tablet by mouth daily.        . pantoprazole (PROTONIX) 40 MG tablet Take 40 mg by mouth daily.      . pravastatin (PRAVACHOL) 80 MG tablet Take 80 mg by mouth daily.      . tamsulosin (FLOMAX) 0.4 MG CAPS capsule Take 1 capsule (0.4 mg total) by mouth daily.  30 capsule    . traMADol (ULTRAM) 50 MG tablet Take 50 mg by mouth every 6 (six) hours as needed for pain.      Marland Kitchen  warfarin (COUMADIN) 4 MG tablet Take 1 tablet (4 mg total) by mouth daily at 6 PM.       No current facility-administered medications for this visit.     Physical Exam: BP 94/58 HR 70 Resp 20 Wounds: Chest and right leg EVH incisions healing well.  Sternum stable. Heart: regular rate and rhythm Lungs: Clear to auscultation Extremities: No significant LE edema   Diagnostic Tests: Chest xray: Dg Chest 2 View  09/29/2013   CLINICAL DATA:  68 -year-old male with acute non ST elevation myocardial infarction. Initial encounter.  EXAM: CHEST  2 VIEW  COMPARISON:  09/02/2013 and earlier.  FINDINGS: Stable postoperative changes to the mediastinum and heart, with cardiac event recorder re- identified. Stable cardiac size and mediastinal contours. Visualized tracheal air column is within normal limits. Interval improved ventilation at the left lung base. Small left pleural  effusion persists. Linear increased markings persist. No pneumothorax or pulmonary edema. The right lung is clear. Posterior lateral left fourth, fifth, and sixth rib fractures with periosteal reaction. Chronic appearing left eighth rib fracture. No acute osseous abnormality identified.  IMPRESSION: 1. Improved ventilation at the left lung base but residual small pleural effusion and atelectasis versus scarring. 2. Healing left fourth - sixth rib fractures.   Electronically Signed   By: Lars Pinks M.D.   On: 09/29/2013 12:25       Assessment/Plan: Mr. Hochstetler is making excellent progress status post CABG.  He still has some lateral rib pain due to preop fractures, but this is slowly improving.  He has been discharged from SNF, and plans to begin outpatient cardiac rehab soon.  His appetite is good and he is voiding without difficulty.  On exam today, his lower extremity edema is markedly improved from his hospital stay, with only a small residual effusion on chest x-ray.  I have asked him to decrease his Lasix dose to 40 mg bid, which he had been on chronically prior to surgery.  He does have a followup next week with cardiology.  The patient is stable from a surgical standpoint, and we will see him on an as needed basis in the future.

## 2013-10-01 ENCOUNTER — Encounter: Payer: Medicare Other | Admitting: Physician Assistant

## 2013-10-01 ENCOUNTER — Ambulatory Visit: Payer: Medicare Other

## 2013-10-06 ENCOUNTER — Encounter: Payer: Self-pay | Admitting: Physician Assistant

## 2013-10-06 ENCOUNTER — Ambulatory Visit (INDEPENDENT_AMBULATORY_CARE_PROVIDER_SITE_OTHER): Payer: Medicare Other | Admitting: Physician Assistant

## 2013-10-06 ENCOUNTER — Telehealth: Payer: Self-pay | Admitting: Physician Assistant

## 2013-10-06 ENCOUNTER — Ambulatory Visit (INDEPENDENT_AMBULATORY_CARE_PROVIDER_SITE_OTHER): Payer: Medicare Other | Admitting: *Deleted

## 2013-10-06 ENCOUNTER — Telehealth: Payer: Self-pay | Admitting: *Deleted

## 2013-10-06 VITALS — BP 110/58 | HR 64 | Ht 67.0 in | Wt 158.0 lb

## 2013-10-06 DIAGNOSIS — I480 Paroxysmal atrial fibrillation: Secondary | ICD-10-CM

## 2013-10-06 DIAGNOSIS — I2510 Atherosclerotic heart disease of native coronary artery without angina pectoris: Secondary | ICD-10-CM

## 2013-10-06 DIAGNOSIS — I2589 Other forms of chronic ischemic heart disease: Secondary | ICD-10-CM

## 2013-10-06 DIAGNOSIS — I10 Essential (primary) hypertension: Secondary | ICD-10-CM

## 2013-10-06 DIAGNOSIS — R55 Syncope and collapse: Secondary | ICD-10-CM

## 2013-10-06 DIAGNOSIS — I255 Ischemic cardiomyopathy: Secondary | ICD-10-CM

## 2013-10-06 DIAGNOSIS — E7800 Pure hypercholesterolemia, unspecified: Secondary | ICD-10-CM

## 2013-10-06 DIAGNOSIS — I4891 Unspecified atrial fibrillation: Secondary | ICD-10-CM

## 2013-10-06 LAB — BASIC METABOLIC PANEL
BUN: 17 mg/dL (ref 6–23)
CO2: 23 mEq/L (ref 19–32)
Calcium: 9 mg/dL (ref 8.4–10.5)
Chloride: 97 mEq/L (ref 96–112)
Creatinine, Ser: 1.5 mg/dL (ref 0.4–1.5)
GFR: 49.32 mL/min — ABNORMAL LOW (ref >60.00)
Glucose, Bld: 115 mg/dL — ABNORMAL HIGH (ref 70–99)
Potassium: 4.8 mEq/L (ref 3.5–5.1)
Sodium: 130 mEq/L — ABNORMAL LOW (ref 135–145)

## 2013-10-06 LAB — MDC_IDC_ENUM_SESS_TYPE_INCLINIC
Date Time Interrogation Session: 20150706143125
Zone Setting Detection Interval: 2000 ms
Zone Setting Detection Interval: 3000 ms
Zone Setting Detection Interval: 370 ms

## 2013-10-06 LAB — PROTIME-INR
INR: 1.6 ratio — ABNORMAL HIGH (ref 0.8–1.0)
Prothrombin Time: 17.1 s — ABNORMAL HIGH (ref 9.6–13.1)

## 2013-10-06 MED ORDER — AMIODARONE HCL 200 MG PO TABS: 200.0000 mg | ORAL_TABLET | Freq: Every day | ORAL | Status: DC

## 2013-10-06 MED ORDER — WARFARIN SODIUM 4 MG PO TABS: ORAL_TABLET | ORAL | Status: DC

## 2013-10-06 MED ORDER — FUROSEMIDE 80 MG PO TABS: ORAL_TABLET | ORAL | Status: DC

## 2013-10-06 NOTE — Telephone Encounter (Signed)
Please tell Gerald Rosario that I reviewed his case with Dr. Kirk Ruths. Given his presentation to the hospital with syncope, he should refrain from driving for 6 mos (from 08/18/2013). So, no driving until 61/51/8343. Richardson Dopp, PA-C   10/06/2013 5:10 PM

## 2013-10-06 NOTE — Progress Notes (Signed)
Cardiology Office Note    Date:  10/06/2013   ID:  Gerald Rosario, DOB [birth year], MRN [id number]  PCP:  Saralyn Pilar  Cardiologist:  Dr. Kirk Ruths   Electrophysiologist:  Dr. Virl Axe    History of Present Illness: Gerald Rosario is a 68 y.o. male with a hx of CAD, ischemic cardiomyopathy, systolic CHF, HTN, HL, diabetes.  Cardiac catheterization in 10/2010 demonstrated an occluded LAD and CFX, 80% Dx and severe diffuse disease in the RCA. Cardiac MRI 09/2011 demonstrated EF 33% and scar involving the septum, anterior wall, apex and posterior lateral wall. Medical therapy was pursued due to lack of viability. Patient's ejection fraction  improved over time. Last Echo 03/2012 demonstrated normal LV function and grade 2 diastolic dysfunction. Cardiac MRI 08/2012 demonstrated EF 52%. Previously followed by Dr. Lia Foyer. He established with Dr. [name] 08/2012.  Patient was recently admitted 5/18-6/8 with a non-STEMI in the setting of syncope resulting in a fall down steps and multiple rib fractures. He was seen by EP and underwent ILR implantation.  Patient developed a large hemothorax and underwent left chest tube placement. He was not anticoagulated secondary to hemothorax. He also developed atrial fibrillation.  He ultimately underwent cardiac catheterization which demonstrated severe 3 vessel CAD. EF remained normal by echo.  He underwent CABG (LIMA-LAD; SVG-RAMUS; SVG-DIAG), clipping of atrial appendage, evacuation of L hemothorax by Dr. Roxy Manns 08/28/13.  He was placed on amiodarone for atrial fibrillation. He was ultimately deemed safe to start anticoagulation and was placed on Coumadin. Postoperative course was complicated by volume overload and urinary retention.  He was discharged with an indwelling Foley catheter. He was discharged to SNF [facility]   He returns for follow up.  He is home now.  He is doing very well.  He denies any significant dyspnea.  He would like to start  cardiac rehab soon.  His chest is still sore.  He has to sleep on an incline due to pain from healing rib fractures.  He denies PND, edema.  He denies syncope.  No one has checked his INR since he left SNF 6/29.  He sees his primary care doctor this week and plans to have it managed there.     Studies:  - LHC (08/26/13):  Proximal LAD 80-90%, then occluded with collaterals, mid AV groove circumflex occluded, proximal RI 60-70%, proximal RCA 90% then occluded mid vessel.  - Echo (08/19/13):  EF 55-60%, normal wall motion, mild MR, moderate LAE  - Echo (08/22/13): EF 60-65%, normal wall motion, moderate LAE, trivial effusion.  - Carotid US (08/19/13):  Bilateral ICA 1-39%   Recent Labs: 08/18/2013: ALT 17  08/20/2013: HDL Cholesterol by NMR 63; LDL (calc) 79  08/23/2013: TSH 2.310  09/06/2013: Creatinine 1.10; Hemoglobin 9.0*; Potassium 5.0; Pro B Natriuretic peptide (BNP) 5869.0*   Wt Readings from Last 3 Encounters:  10/06/13 158 lb (71.668 kg)  09/29/13 182 lb (82.555 kg)  09/08/13 182 lb (82.555 kg)     Past Medical History  Diagnosis Date  . Hypertension   . Asthma     start dulera 100 April 12,2011 > better but "knot in throat" so try qvar June 7,2011 > preferred dulera. HFA 90% May 10,2011 > 90% October 17,2011. PFT's June 7,2011 wnl x minimal nonspecific mid flow reduction while on dulera. Changed to advair intermediate strength October 17,2011 due to ins issue  . Ischemic cardiomyopathy     Repeat Cardiac MRI - EF 52%, distal Septal &  apical Akinesis (suggest scar), unable to assess viability due to patient movement  . Hoarseness 12-20-11    onset 11/09. neg w/u 09/2008. saw Dr. Raelene Bott. 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 Syncope   . Enlarged prostate 12-20-11    hx. -has Foley cath at present for retention  . Dyspnea   . Pulmonary nodule   . Hyperlipidemia   . CAD (coronary artery disease) July 2012    Severe three-vessel disease - with occluded LAD and circumflex with  80% diagonal disease. Moderate disease in the ramus with diffusely  . H/O hyperkalemia   . Hearing loss   . Vocal cord dysfunction   . Enlarged prostate   . 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  . CHF (congestive heart failure) 12-20-11    Most recent echo 03/2012: EF improved to 55-60% with apical hypokinesis. Grade 2 diastolic dysfunction.  . Diabetes mellitus 12-20-11    Dabetes-many yrs-Lantus since 1 yr.  . Chronic kidney disease 12-20-11    past hx. with elevated Potassium-tx. meds, has no renal  MD, pt. states is improved.  . NSTEMI (non-ST elevated myocardial infarction)   . A-fib 08/24/2013  . Hemothorax on left 08/23/2013  . Syncope and collapse 08/20/2013  . NSTEMI (non-ST elevated myocardial infarction)   . Multiple fractures of ribs of left side 08/18/2013  . Chronic systolic heart failure 11/05/6312  . Diabetes mellitus 11/08/2010  . Coronary artery disease 11/07/2010    October 24, 2010   CONCLUSION:   1. Severe reduction in left ventricular systolic function, compatible       with severe ischemic cardiomyopathy.   2. Total occlusion of the left anterior descending artery with late       collaterals.   3. Total occlusion of the arteriovenous circumflex with late       collaterals.   4. Moderately high-grade disease of a segmentally diseased diagonal       branch.     Marland Kitchen HYPERTENSION 03/20/2007    Qualifier: Diagnosis of  By: Tilden Dome    . Hypercholesterolemia 12/09/2010  . PULMONARY NODULE, RIGHT MIDDLE LOBE 03/20/2007    Annotation: multiple Qualifier: Diagnosis of  By: Tilden Dome    . 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 and clipping of LA appendage 08/28/2013    LIMA to LAD, SVG to Diag, SVG to Intermediate, EVH via right thigh and leg Clipping of LA appendage using 40 mm Aticure LA clip     Current Outpatient Prescriptions  Medication Sig Dispense Refill  . amiodarone (PACERONE) 200 MG tablet Take 1  tablet (200 mg total) by mouth 2 (two) times daily after a meal.      . aspirin 81 MG tablet Take 1 tablet (81 mg total) by mouth every morning.  30 tablet    . ferrous sulfate 325 (65 FE) MG tablet Take 1 tablet (325 mg total) by mouth daily with breakfast.    3  . fish oil-omega-3 fatty acids 1000 MG capsule Take 1 g by mouth daily.        . fluticasone-salmeterol (ADVAIR HFA) 115-21 MCG/ACT inhaler Inhale 2 puffs into the lungs 2 (two) times daily.       . furosemide (LASIX) 80 MG tablet Take 1 tablet (80 mg total) by mouth 2 (two) times daily.      Marland Kitchen glipiZIDE (GLUCOTROL) 10 MG tablet Take 10 mg by mouth daily before  breakfast.      . Glucosamine-Chondroitin (OSTEO BI-FLEX REGULAR STRENGTH PO) Take 1 capsule by mouth daily.      . metoprolol tartrate (LOPRESSOR) 25 MG tablet Take 0.5 tablets (12.5 mg total) by mouth 2 (two) times daily.      . Multiple Vitamin (MULTIVITAMIN) tablet Take 1 tablet by mouth daily.        . pantoprazole (PROTONIX) 40 MG tablet Take 40 mg by mouth daily.      . pravastatin (PRAVACHOL) 80 MG tablet Take 80 mg by mouth daily.      . tamsulosin (FLOMAX) 0.4 MG CAPS capsule Take 1 capsule (0.4 mg total) by mouth daily.  30 capsule    . traMADol (ULTRAM) 50 MG tablet Take 50 mg by mouth every 6 (six) hours as needed for pain.      Marland Kitchen warfarin (COUMADIN) 4 MG tablet Take 1 tablet (4 mg total) by mouth daily at 6 PM.       No current facility-administered medications for this visit.    Allergies:   Sulfa antibiotics   Social History:  The patient  reports that he quit smoking about 32 years ago. His smoking use included Cigarettes. He has a 36 pack-year smoking history. He has never used smokeless tobacco. He reports that he drinks alcohol. He reports that he does not use illicit drugs.   Family History:  The patient's family history includes Cancer in his mother; Heart attack in his father.   ROS:  Please see the history of present illness.      All other systems  reviewed and negative.   PHYSICAL EXAM: VS:  BP 110/58  Pulse 64  Ht 5\' 7"  (1.702 m)  Wt 158 lb (71.668 kg)  BMI 24.74 kg/m2 Well nourished, well developed, in no acute distress HEENT: normal Neck: no JVD Cardiac:  normal S1, S2; RRR; no murmur Lungs:  clear to auscultation bilaterally, no wheezing, rhonchi or rales Abd: soft, nontender, no hepatomegaly Ext: no edema Skin: warm and dry Neuro:  CNs 2-12 intact, no focal abnormalities noted  EKG:  NSR, HR 64, normal axis, nonspecific ST-T wave changes, QTc 466 ms   ASSESSMENT AND PLAN:  1. CAD s/p CABG 08/2013:  He is progressing nicely since recent CABG.  He has been released by [facility].  He has been contacted by cardiac rehab and needs to call them back to arrange an initial visit.  Continue ASA, beta blocker, statin.  2. Syncope s/p ILR:  He will have a visit with the device clinic today.  I will review with Drs. Caryl Comes and [place] whether we have to restrict him from driving for 6 mos given the circumstances of his admission.   3. Ischemic cardiomyopathy:  Continue beta blocker.  His BP is a little soft to add ACEI.  EF was normal prior to CABG.  Volume appears stable on current dose of Lasix.  Check BMET today.  Continue current Rx.  4. Paroxysmal atrial fibrillation:  Decrease Amiodarone to 200 QD.  Consider stopping at f/u.  I will defer this to Dr. [name].  Continue Coumadin.  He wants his coumadin managed with PCP.  I will check INR today and forward to his PCP, whom he sees later this week.  5. Essential hypertension:  Controlled. 6. Hypercholesterolemia: continue statin.  7. Disposition:  F/u with Dr. Kirk Ruths in 6 weeks.  Arrange f/u with Dr. Virl Axe in 3 mos.   Signed, Richardson Dopp, PA-C, MHS 10/06/2013  12:19 PM    [facility] [address], [place], [place]  [address] Phone: [phone]; Fax: [phone]

## 2013-10-06 NOTE — Progress Notes (Signed)
Overdue ILR Wound check appointment. Steri-strips not present. Wound without redness or edema. Incision edges approximated, wound well healed. Pt with 0 tachy episodes; 0 brady episodes; 2 pause---EGMs appear as bundle branch block. 34.1% AT/AF + warfarin. ROV w/ Dr. Lovena Le in 38mo.

## 2013-10-06 NOTE — Telephone Encounter (Signed)
pt notified about lab results and med changes, bmet 7/20, w/verbal read back x 2 with understanding. Pt aware per Dr. Axel Filler W. PA, since syncope episode 08/18/13 no driving x 6 months, 88/71/95, pt said ok.

## 2013-10-06 NOTE — Patient Instructions (Signed)
LAB WORK TODAY; BMET, PT/INR  DECREASE AMIODARONE TO 200 MG DAILY  Your physician recommends that you schedule a follow-up appointment in: [place] DR. [place]  Your physician recommends that you schedule a follow-up appointment in: [place]. Caryl Comes

## 2013-10-07 ENCOUNTER — Telehealth: Payer: Self-pay | Admitting: Internal Medicine

## 2013-10-07 NOTE — Telephone Encounter (Signed)
New message ° ° ° ° ° °Did you get his remote transmission? °

## 2013-10-07 NOTE — Telephone Encounter (Signed)
Spoke with pt and informed pt that we received his transmission. Pt verbalized understanding.

## 2013-10-09 ENCOUNTER — Encounter: Payer: Self-pay | Admitting: Internal Medicine

## 2013-10-16 ENCOUNTER — Inpatient Hospital Stay (HOSPITAL_COMMUNITY)
Admission: RE | Admit: 2013-10-16 | Discharge: 2013-10-16 | Disposition: A | Discharge status: Home / Self Care | Payer: Medicare Other | Source: Ambulatory Visit

## 2013-10-16 NOTE — Progress Notes (Signed)
Cardiac Rehab Medication Review by a Pharmacist  Does the patient  feel that his/her medications are working for him/her?  yes  Has the patient been experiencing any side effects to the medications prescribed?  yes  Does the patient measure his/her own blood pressure or blood glucose at home?  yes   Does the patient have any problems obtaining medications due to transportation or finances?   no  Understanding of regimen: good Understanding of indications: good Potential of compliance: good  Pharmacist comments: Patient demonstrated a good understanding of his medication regimen and verbalized understanding. Pt states that he dose have some financial difficulties with all of his medications but he does manage to get them monthly.   Thank you for allowing pharmacy to be part of this patients care team  Winda Summerall M. Red Mandt, Pharm.D Clinical Pharmacy Resident Pager: [phone] 10/16/2013 .8:18 AM

## 2013-10-20 ENCOUNTER — Encounter (HOSPITAL_COMMUNITY)
Admission: RE | Admit: 2013-10-20 | Discharge: 2013-10-20 | Disposition: A | Discharge status: Home / Self Care | Payer: Medicare Other | Source: Ambulatory Visit | Attending: Cardiology | Admitting: Cardiology

## 2013-10-20 ENCOUNTER — Other Ambulatory Visit: Payer: Medicare Other

## 2013-10-20 ENCOUNTER — Telehealth: Payer: Self-pay

## 2013-10-20 ENCOUNTER — Encounter (HOSPITAL_COMMUNITY): Payer: Self-pay

## 2013-10-20 DIAGNOSIS — I5022 Chronic systolic (congestive) heart failure: Secondary | ICD-10-CM | POA: Insufficient documentation

## 2013-10-20 DIAGNOSIS — Z951 Presence of aortocoronary bypass graft: Secondary | ICD-10-CM | POA: Insufficient documentation

## 2013-10-20 DIAGNOSIS — I2510 Atherosclerotic heart disease of native coronary artery without angina pectoris: Secondary | ICD-10-CM | POA: Diagnosis present

## 2013-10-20 DIAGNOSIS — I214 Non-ST elevation (NSTEMI) myocardial infarction: Secondary | ICD-10-CM | POA: Insufficient documentation

## 2013-10-20 DIAGNOSIS — I2589 Other forms of chronic ischemic heart disease: Secondary | ICD-10-CM | POA: Diagnosis not present

## 2013-10-20 DIAGNOSIS — Z5189 Encounter for other specified aftercare: Secondary | ICD-10-CM | POA: Diagnosis not present

## 2013-10-20 DIAGNOSIS — I4891 Unspecified atrial fibrillation: Secondary | ICD-10-CM | POA: Diagnosis not present

## 2013-10-20 LAB — GLUCOSE, CAPILLARY
Glucose-Capillary: 204 mg/dL — ABNORMAL HIGH (ref 70–99)
Glucose-Capillary: 192 mg/dL — ABNORMAL HIGH (ref 70–99)

## 2013-10-20 NOTE — Progress Notes (Addendum)
Pt started cardiac rehab today.  Pt tolerated light exercise without difficulty.  VSS with exception of post exercise hypotension BP-80/56, pt asymptomatic. Pt given water BP-89/57, .  Pt given gatorade BP-109/67.  telemetry-NSR with occasional unifocal PVC.  PHQ-1. Pt states he does occasionally feel lonely which leads to depressed mood. His children live out of state.  However overall pt has positive outlook and good coping skills. Pt enjoys visiting friends. Pt cardiac rehab goals are to lose weight, begin and maintain heart healthy lifestyle and be able to return to [employer] and participate in upper body strengthening exercises. Pt oriented to exercise equipment and routine.  Understanding verbalized.

## 2013-10-20 NOTE — Telephone Encounter (Signed)
Telephoned pt and he states he has appt on Wednesday with PCP for INR check and since they are managing per his request, they will be responsible MD to do refills. He verbalized understanding and has enough until the end of the week, but sees them on Wednesday.

## 2013-10-21 ENCOUNTER — Ambulatory Visit (INDEPENDENT_AMBULATORY_CARE_PROVIDER_SITE_OTHER): Payer: Medicare Other | Admitting: *Deleted

## 2013-10-21 DIAGNOSIS — R55 Syncope and collapse: Secondary | ICD-10-CM

## 2013-10-21 LAB — MDC_IDC_ENUM_SESS_TYPE_REMOTE: Date Time Interrogation Session: 20150714040500

## 2013-10-22 ENCOUNTER — Encounter (HOSPITAL_COMMUNITY): Payer: Medicare Other

## 2013-10-22 ENCOUNTER — Encounter (HOSPITAL_COMMUNITY)
Admission: RE | Admit: 2013-10-22 | Discharge: 2013-10-22 | Disposition: A | Discharge status: Home / Self Care | Payer: Medicare Other | Source: Ambulatory Visit | Attending: Cardiology | Admitting: Cardiology

## 2013-10-22 DIAGNOSIS — Z5189 Encounter for other specified aftercare: Secondary | ICD-10-CM | POA: Diagnosis not present

## 2013-10-22 LAB — GLUCOSE, CAPILLARY
Glucose-Capillary: 113 mg/dL — ABNORMAL HIGH (ref 70–99)
Glucose-Capillary: 122 mg/dL — ABNORMAL HIGH (ref 70–99)

## 2013-10-23 NOTE — Progress Notes (Signed)
Loop recorder 

## 2013-10-24 ENCOUNTER — Encounter (HOSPITAL_COMMUNITY)
Admission: RE | Admit: 2013-10-24 | Discharge: 2013-10-24 | Disposition: A | Discharge status: Home / Self Care | Payer: Medicare Other | Source: Ambulatory Visit | Attending: Cardiology | Admitting: Cardiology

## 2013-10-24 ENCOUNTER — Encounter: Payer: Self-pay | Admitting: Internal Medicine

## 2013-10-24 ENCOUNTER — Encounter (HOSPITAL_COMMUNITY): Payer: Medicare Other

## 2013-10-24 DIAGNOSIS — Z5189 Encounter for other specified aftercare: Secondary | ICD-10-CM | POA: Diagnosis not present

## 2013-10-24 LAB — GLUCOSE, CAPILLARY
Glucose-Capillary: 130 mg/dL — ABNORMAL HIGH (ref 70–99)
Glucose-Capillary: 135 mg/dL — ABNORMAL HIGH (ref 70–99)

## 2013-10-24 NOTE — Progress Notes (Signed)
Pt in today for his third day of exercise at the 8:15 am exercise class.  Pt with asymptomatic low exit bp -80/50.  Pt given  1/2 cup of gatorade. Recheck 95/69. Will send rehab report to Dr. [name] for review along with medication list.  Pt denies any other complaints, okay to discharge from rehab.  Cherre Huger, BSN

## 2013-10-27 ENCOUNTER — Encounter (HOSPITAL_COMMUNITY): Payer: Medicare Other

## 2013-10-27 ENCOUNTER — Encounter (HOSPITAL_COMMUNITY): Payer: Self-pay

## 2013-10-27 ENCOUNTER — Encounter (HOSPITAL_COMMUNITY)
Admission: RE | Admit: 2013-10-27 | Discharge: 2013-10-27 | Disposition: A | Discharge status: Home / Self Care | Payer: Medicare Other | Source: Ambulatory Visit | Attending: Cardiology | Admitting: Cardiology

## 2013-10-27 DIAGNOSIS — Z5189 Encounter for other specified aftercare: Secondary | ICD-10-CM | POA: Diagnosis not present

## 2013-10-27 LAB — GLUCOSE, CAPILLARY
Glucose-Capillary: 109 mg/dL — ABNORMAL HIGH (ref 70–99)
Glucose-Capillary: 87 mg/dL (ref 70–99)

## 2013-10-27 NOTE — Progress Notes (Signed)
Reviewed home exercise with pt today.  Pt plans to walk 30 minutes on 5-7 days a week.  Reviewed THR, pulse, RPE, sign and symptoms, NTG use, and when to call 911 or MD.  Pt voiced understanding.

## 2013-10-28 ENCOUNTER — Telehealth: Payer: Self-pay | Admitting: *Deleted

## 2013-10-28 NOTE — Telephone Encounter (Signed)
Spoke with pt, rehab reported bp dropping after exercise. Per dr [name] pt to stop metoprolol. Per pt he has discussed with his pcp dr powers, he does not think he is eating enough prior to exercise. The pt wants to continue the metoprolol for now. He will call with problems.

## 2013-10-29 ENCOUNTER — Encounter (HOSPITAL_COMMUNITY): Payer: Medicare Other

## 2013-10-29 ENCOUNTER — Encounter (HOSPITAL_COMMUNITY)
Admission: RE | Admit: 2013-10-29 | Discharge: 2013-10-29 | Disposition: A | Discharge status: Home / Self Care | Payer: Medicare Other | Source: Ambulatory Visit | Attending: Cardiology | Admitting: Cardiology

## 2013-10-29 DIAGNOSIS — Z5189 Encounter for other specified aftercare: Secondary | ICD-10-CM | POA: Diagnosis not present

## 2013-10-29 LAB — GLUCOSE, CAPILLARY: Glucose-Capillary: 184 mg/dL — ABNORMAL HIGH (ref 70–99)

## 2013-10-29 NOTE — Progress Notes (Signed)
PSYCHOSOCIAL ASSESSMENT  Pt psychosocial assessment reveals no barriers to rehab participation.  Pt quality of life is slightly altered by his physical constraints which limits her ability to perform tasks as prior to his  illness. Pt reports his physical capacity has actually been declining over the past several years since CHF diagnosis, however pt is committed and motivated to improve his physical fitness ability.  Pt reports his dypsnea and fatigue are improving and he feels much better than he has in several years.  Pt reports his sexual ability is diminishing with age however he enjoys intimacy in other ways which is very satisfying and pleasurable to him.  Pt admits he at once was discouraged with forced retirement however those feeling have now resolved and he is content. 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   Pt exhibits positive coping skills and has supportive family.  Offered emotional support and reassurance.  Will continue to monitor.

## 2013-10-31 ENCOUNTER — Encounter (HOSPITAL_COMMUNITY)
Admission: RE | Admit: 2013-10-31 | Discharge: 2013-10-31 | Disposition: A | Discharge status: Home / Self Care | Payer: Medicare Other | Source: Ambulatory Visit | Attending: Cardiology | Admitting: Cardiology

## 2013-10-31 ENCOUNTER — Encounter (HOSPITAL_COMMUNITY): Payer: Medicare Other

## 2013-10-31 DIAGNOSIS — Z5189 Encounter for other specified aftercare: Secondary | ICD-10-CM | POA: Diagnosis not present

## 2013-10-31 LAB — GLUCOSE, CAPILLARY: Glucose-Capillary: 158 mg/dL — ABNORMAL HIGH (ref 70–99)

## 2013-11-03 ENCOUNTER — Encounter (HOSPITAL_COMMUNITY): Payer: Medicare Other

## 2013-11-03 ENCOUNTER — Encounter (HOSPITAL_COMMUNITY)
Admission: RE | Admit: 2013-11-03 | Discharge: 2013-11-03 | Disposition: A | Discharge status: Home / Self Care | Payer: Medicare Other | Source: Ambulatory Visit | Attending: Cardiology | Admitting: Cardiology

## 2013-11-03 DIAGNOSIS — Z951 Presence of aortocoronary bypass graft: Secondary | ICD-10-CM | POA: Diagnosis present

## 2013-11-03 DIAGNOSIS — I214 Non-ST elevation (NSTEMI) myocardial infarction: Secondary | ICD-10-CM | POA: Insufficient documentation

## 2013-11-03 NOTE — Progress Notes (Signed)
Pt arrived at cardiac rehab CBG-82.  Pt ate his usual breakfast this morning however he admits that he possibly took glipizide that is normally held prior on exercise day. Pt was given lemonade and graham cracker.  15 minute recheck CBG-90.  Pt did not exercise was able to participate in nutritional consult. Pt advised to get pill box upon arrival home. Pt will plan to hold glipizide prior to exercise. Pt verbalized understanding.

## 2013-11-03 NOTE — Progress Notes (Signed)
Gerald Rosario 68 y.o. male Nutrition Note Spoke with pt.  Nutrition Plan and Nutrition Survey goals reviewed with pt. Pt is following Step 2 of the Therapeutic Lifestyle Changes diet. Pt is diabetic. Per pt, his last A1c was 6.1, which indicates blood glucose well-controlled. Pt varies the time of day he checks his CBG's on a weekly basis. Fasting CBG this am reportedly 86 mg/dL. This Probation officer went over Diabetes Education test results. Pt is on Coumadin. Pt aware of some foods with Vitamin K. Recommendation to follow a diet consistent in Vitamin K intake discussed. Pt watches sodium intake due to CHF. Pt chooses no added salt canned vegetables, and fresh or frozen vegetables. Pt states he tries to consume minimal amounts of processed foods. Pt expressed understanding of the information reviewed. Pt aware of nutrition education classes offered and plans on attending nutrition classes.  Nutrition Diagnosis   Food-and nutrition-related knowledge deficit related to lack of exposure to information as related to diagnosis of: ? CVD ? DM   Overweight related to excessive energy intake as evidenced by a BMI of 25.1  Nutrition RX/ Estimated Daily Nutrition Needs for: wt loss  1200-1650 Kcal, 30-45 gm fat, 7-11 gm sat fat, 1.1-1.6 gm trans-fat, <1500 mg sodium, 150-175 gm CHO   Nutrition Intervention   Pt's individual nutrition plan reviewed with pt.   Benefits of adopting Therapeutic Lifestyle Changes discussed when Medficts reviewed.   Pt to attend the Portion Distortion class   Pt to attend the  ? Nutrition I class - met; 10/28/13                    ? Nutrition II class        ? Diabetes Blitz class - met; 10/21/13       ? Diabetes Q & A class   Handout given for Coumadin and Consistent Vitamin K intake   Continue client-centered nutrition education by RD, as part of interdisciplinary care. Goal(s)   Pt to identify food quantities necessary to achieve: ? wt loss to a goal wt of 132-150 lb (60.3-68.5 kg)  at graduation from cardiac rehab.    Use pre-meal and post-meal CBG's and A1c to determine whether adjustments in food/meal planning will be beneficial or if any meds need to be combined with nutrition therapy. Monitor and Evaluate progress toward nutrition goal with team. Nutrition Risk: Change to Moderate Derek Mound, M.Ed, RD, LDN, CDE 11/03/2013 9:27 AM

## 2013-11-04 LAB — GLUCOSE, CAPILLARY
Glucose-Capillary: 82 mg/dL (ref 70–99)
Glucose-Capillary: 90 mg/dL (ref 70–99)

## 2013-11-05 ENCOUNTER — Encounter (HOSPITAL_COMMUNITY): Payer: Medicare Other

## 2013-11-05 ENCOUNTER — Encounter (HOSPITAL_COMMUNITY)
Admission: RE | Admit: 2013-11-05 | Discharge: 2013-11-05 | Disposition: A | Discharge status: Home / Self Care | Payer: Medicare Other | Source: Ambulatory Visit | Attending: Cardiology | Admitting: Cardiology

## 2013-11-05 DIAGNOSIS — I214 Non-ST elevation (NSTEMI) myocardial infarction: Secondary | ICD-10-CM | POA: Diagnosis not present

## 2013-11-05 LAB — GLUCOSE, CAPILLARY: Glucose-Capillary: 135 mg/dL — ABNORMAL HIGH (ref 70–99)

## 2013-11-07 ENCOUNTER — Encounter (HOSPITAL_COMMUNITY): Payer: Medicare Other

## 2013-11-07 ENCOUNTER — Encounter (HOSPITAL_COMMUNITY)
Admission: RE | Admit: 2013-11-07 | Discharge: 2013-11-07 | Disposition: A | Discharge status: Home / Self Care | Payer: Medicare Other | Source: Ambulatory Visit | Attending: Cardiology | Admitting: Cardiology

## 2013-11-07 DIAGNOSIS — I214 Non-ST elevation (NSTEMI) myocardial infarction: Secondary | ICD-10-CM | POA: Diagnosis not present

## 2013-11-10 ENCOUNTER — Encounter (HOSPITAL_COMMUNITY): Payer: Medicare Other

## 2013-11-10 ENCOUNTER — Encounter (HOSPITAL_COMMUNITY)
Admission: RE | Admit: 2013-11-10 | Discharge: 2013-11-10 | Disposition: A | Discharge status: Home / Self Care | Payer: Medicare Other | Source: Ambulatory Visit | Attending: Cardiology | Admitting: Cardiology

## 2013-11-10 DIAGNOSIS — I214 Non-ST elevation (NSTEMI) myocardial infarction: Secondary | ICD-10-CM | POA: Diagnosis not present

## 2013-11-10 LAB — GLUCOSE, CAPILLARY: Glucose-Capillary: 160 mg/dL — ABNORMAL HIGH (ref 70–99)

## 2013-11-12 ENCOUNTER — Encounter (HOSPITAL_COMMUNITY)
Admission: RE | Admit: 2013-11-12 | Discharge: 2013-11-12 | Disposition: A | Discharge status: Home / Self Care | Payer: Medicare Other | Source: Ambulatory Visit | Attending: Cardiology | Admitting: Cardiology

## 2013-11-12 ENCOUNTER — Encounter (HOSPITAL_COMMUNITY): Payer: Medicare Other

## 2013-11-12 DIAGNOSIS — I214 Non-ST elevation (NSTEMI) myocardial infarction: Secondary | ICD-10-CM | POA: Diagnosis not present

## 2013-11-12 LAB — GLUCOSE, CAPILLARY: Glucose-Capillary: 165 mg/dL — ABNORMAL HIGH (ref 70–99)

## 2013-11-14 ENCOUNTER — Encounter (HOSPITAL_COMMUNITY): Payer: Medicare Other

## 2013-11-14 ENCOUNTER — Encounter (HOSPITAL_COMMUNITY)
Admission: RE | Admit: 2013-11-14 | Discharge: 2013-11-14 | Disposition: A | Discharge status: Home / Self Care | Payer: Medicare Other | Source: Ambulatory Visit | Attending: Cardiology | Admitting: Cardiology

## 2013-11-14 DIAGNOSIS — I214 Non-ST elevation (NSTEMI) myocardial infarction: Secondary | ICD-10-CM | POA: Diagnosis not present

## 2013-11-17 ENCOUNTER — Encounter (INDEPENDENT_AMBULATORY_CARE_PROVIDER_SITE_OTHER): Payer: Medicare Other | Admitting: Ophthalmology

## 2013-11-17 ENCOUNTER — Encounter (HOSPITAL_COMMUNITY): Payer: Medicare Other

## 2013-11-17 ENCOUNTER — Encounter (HOSPITAL_COMMUNITY)
Admission: RE | Admit: 2013-11-17 | Discharge: 2013-11-17 | Disposition: A | Discharge status: Home / Self Care | Payer: Medicare Other | Source: Ambulatory Visit | Attending: Cardiology | Admitting: Cardiology

## 2013-11-17 DIAGNOSIS — H35039 Hypertensive retinopathy, unspecified eye: Secondary | ICD-10-CM

## 2013-11-17 DIAGNOSIS — I214 Non-ST elevation (NSTEMI) myocardial infarction: Secondary | ICD-10-CM | POA: Diagnosis not present

## 2013-11-17 DIAGNOSIS — E11319 Type 2 diabetes mellitus with unspecified diabetic retinopathy without macular edema: Secondary | ICD-10-CM

## 2013-11-17 DIAGNOSIS — E1139 Type 2 diabetes mellitus with other diabetic ophthalmic complication: Secondary | ICD-10-CM

## 2013-11-17 DIAGNOSIS — E11311 Type 2 diabetes mellitus with unspecified diabetic retinopathy with macular edema: Secondary | ICD-10-CM

## 2013-11-17 DIAGNOSIS — H43819 Vitreous degeneration, unspecified eye: Secondary | ICD-10-CM

## 2013-11-17 DIAGNOSIS — I10 Essential (primary) hypertension: Secondary | ICD-10-CM

## 2013-11-17 DIAGNOSIS — E1165 Type 2 diabetes mellitus with hyperglycemia: Secondary | ICD-10-CM

## 2013-11-19 ENCOUNTER — Encounter (HOSPITAL_COMMUNITY): Payer: Medicare Other

## 2013-11-19 ENCOUNTER — Encounter (HOSPITAL_COMMUNITY)
Admission: RE | Admit: 2013-11-19 | Discharge: 2013-11-19 | Disposition: A | Discharge status: Home / Self Care | Payer: Medicare Other | Source: Ambulatory Visit | Attending: Cardiology | Admitting: Cardiology

## 2013-11-19 DIAGNOSIS — I214 Non-ST elevation (NSTEMI) myocardial infarction: Secondary | ICD-10-CM | POA: Diagnosis not present

## 2013-11-20 ENCOUNTER — Encounter: Payer: Self-pay | Admitting: Cardiology

## 2013-11-20 ENCOUNTER — Ambulatory Visit (INDEPENDENT_AMBULATORY_CARE_PROVIDER_SITE_OTHER): Payer: Medicare Other | Admitting: Cardiology

## 2013-11-20 ENCOUNTER — Encounter: Payer: Self-pay | Admitting: Internal Medicine

## 2013-11-20 VITALS — BP 107/65 | HR 71 | Ht 67.0 in | Wt 157.0 lb

## 2013-11-20 DIAGNOSIS — R55 Syncope and collapse: Secondary | ICD-10-CM

## 2013-11-20 DIAGNOSIS — I480 Paroxysmal atrial fibrillation: Secondary | ICD-10-CM

## 2013-11-20 DIAGNOSIS — I4891 Unspecified atrial fibrillation: Secondary | ICD-10-CM

## 2013-11-20 DIAGNOSIS — I2510 Atherosclerotic heart disease of native coronary artery without angina pectoris: Secondary | ICD-10-CM

## 2013-11-20 DIAGNOSIS — I2589 Other forms of chronic ischemic heart disease: Secondary | ICD-10-CM

## 2013-11-20 DIAGNOSIS — I10 Essential (primary) hypertension: Secondary | ICD-10-CM

## 2013-11-20 DIAGNOSIS — I255 Ischemic cardiomyopathy: Secondary | ICD-10-CM

## 2013-11-20 LAB — BASIC METABOLIC PANEL WITH GFR
BUN: 19 mg/dL (ref 6–23)
CO2: 26 mEq/L (ref 19–32)
Calcium: 8.9 mg/dL (ref 8.4–10.5)
Chloride: 101 mEq/L (ref 96–112)
Creat: 1.18 mg/dL (ref 0.50–1.35)
GFR, Est African American: 72 mL/min
GFR, Est Non African American: 63 mL/min
Glucose, Bld: 162 mg/dL — ABNORMAL HIGH (ref 70–99)
Potassium: 4.5 mEq/L (ref 3.5–5.3)
Sodium: 136 mEq/L (ref 135–145)

## 2013-11-20 NOTE — Assessment & Plan Note (Addendum)
LV function has normalized. He is on significant amounts of Lasix. Check potassium and renal function.

## 2013-11-20 NOTE — Assessment & Plan Note (Signed)
Continue aspirin and statin. 

## 2013-11-20 NOTE — Assessment & Plan Note (Signed)
Patient remains in sinus rhythm on examination. I think his atrial fibrillation was most likely related to the stress of his hemothorax. This has resolved. Discontinue amiodarone and follow. I will see him back in 3 months and if no further atrial fibrillation I will discontinue Coumadin.

## 2013-11-20 NOTE — Patient Instructions (Signed)
Your physician recommends that you schedule a follow-up appointment in: 3 MONTHS WITH DR CRENSHAW  STOP AMIODARONE  

## 2013-11-20 NOTE — Addendum Note (Signed)
Addended by: Cristopher Estimable on: 11/20/2013 12:18 PM   Modules accepted: Orders

## 2013-11-20 NOTE — Assessment & Plan Note (Signed)
Implantable loop recorder in place. Followed Dr. Lovena Le.
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
## 2013-11-20 NOTE — Assessment & Plan Note (Signed)
Blood pressure controlled. Continue present medications. 

## 2013-11-21 ENCOUNTER — Encounter: Payer: Self-pay | Admitting: Cardiology

## 2013-11-21 ENCOUNTER — Encounter (HOSPITAL_COMMUNITY)
Admission: RE | Admit: 2013-11-21 | Discharge: 2013-11-21 | Disposition: A | Discharge status: Home / Self Care | Payer: Medicare Other | Source: Ambulatory Visit | Attending: Cardiology | Admitting: Cardiology

## 2013-11-21 ENCOUNTER — Encounter (HOSPITAL_COMMUNITY): Payer: Medicare Other

## 2013-11-21 DIAGNOSIS — I214 Non-ST elevation (NSTEMI) myocardial infarction: Secondary | ICD-10-CM | POA: Diagnosis not present

## 2013-11-21 NOTE — Telephone Encounter (Signed)
Returning your call. °

## 2013-11-21 NOTE — Telephone Encounter (Signed)
This encounter was created in error - please disregard.

## 2013-11-24 ENCOUNTER — Encounter (HOSPITAL_COMMUNITY)
Admission: RE | Admit: 2013-11-24 | Discharge: 2013-11-24 | Disposition: A | Discharge status: Home / Self Care | Payer: Medicare Other | Source: Ambulatory Visit | Attending: Cardiology | Admitting: Cardiology

## 2013-11-24 ENCOUNTER — Encounter (HOSPITAL_COMMUNITY): Payer: Medicare Other

## 2013-11-24 DIAGNOSIS — I214 Non-ST elevation (NSTEMI) myocardial infarction: Secondary | ICD-10-CM | POA: Diagnosis not present

## 2013-11-26 ENCOUNTER — Encounter (HOSPITAL_COMMUNITY)
Admission: RE | Admit: 2013-11-26 | Discharge: 2013-11-26 | Disposition: A | Discharge status: Home / Self Care | Payer: Medicare Other | Source: Ambulatory Visit | Attending: Cardiology | Admitting: Cardiology

## 2013-11-26 ENCOUNTER — Encounter (HOSPITAL_COMMUNITY): Payer: Medicare Other

## 2013-11-26 DIAGNOSIS — I214 Non-ST elevation (NSTEMI) myocardial infarction: Secondary | ICD-10-CM | POA: Diagnosis not present

## 2013-11-26 LAB — GLUCOSE, CAPILLARY: Glucose-Capillary: 123 mg/dL — ABNORMAL HIGH (ref 70–99)

## 2013-11-28 ENCOUNTER — Encounter (HOSPITAL_COMMUNITY): Payer: Medicare Other

## 2013-11-28 ENCOUNTER — Encounter (HOSPITAL_COMMUNITY)
Admission: RE | Admit: 2013-11-28 | Discharge: 2013-11-28 | Disposition: A | Discharge status: Home / Self Care | Payer: Medicare Other | Source: Ambulatory Visit | Attending: Cardiology | Admitting: Cardiology

## 2013-11-28 DIAGNOSIS — I214 Non-ST elevation (NSTEMI) myocardial infarction: Secondary | ICD-10-CM | POA: Diagnosis not present

## 2013-12-01 ENCOUNTER — Encounter (HOSPITAL_COMMUNITY)
Admission: RE | Admit: 2013-12-01 | Discharge: 2013-12-01 | Disposition: A | Discharge status: Home / Self Care | Payer: Medicare Other | Source: Ambulatory Visit | Attending: Cardiology | Admitting: Cardiology

## 2013-12-01 ENCOUNTER — Encounter (HOSPITAL_COMMUNITY): Payer: Medicare Other

## 2013-12-01 DIAGNOSIS — I214 Non-ST elevation (NSTEMI) myocardial infarction: Secondary | ICD-10-CM | POA: Diagnosis not present

## 2013-12-03 ENCOUNTER — Encounter (HOSPITAL_COMMUNITY): Payer: Medicare Other

## 2013-12-03 ENCOUNTER — Encounter (HOSPITAL_COMMUNITY)
Admission: RE | Admit: 2013-12-03 | Discharge: 2013-12-03 | Disposition: A | Discharge status: Home / Self Care | Payer: Medicare Other | Source: Ambulatory Visit | Attending: Cardiology | Admitting: Cardiology

## 2013-12-03 DIAGNOSIS — Z951 Presence of aortocoronary bypass graft: Secondary | ICD-10-CM | POA: Diagnosis present

## 2013-12-03 DIAGNOSIS — I214 Non-ST elevation (NSTEMI) myocardial infarction: Secondary | ICD-10-CM | POA: Insufficient documentation

## 2013-12-05 ENCOUNTER — Encounter (HOSPITAL_COMMUNITY)
Admission: RE | Admit: 2013-12-05 | Discharge: 2013-12-05 | Disposition: A | Discharge status: Home / Self Care | Payer: Medicare Other | Source: Ambulatory Visit | Attending: Cardiology | Admitting: Cardiology

## 2013-12-05 ENCOUNTER — Encounter (HOSPITAL_COMMUNITY): Payer: Medicare Other

## 2013-12-05 DIAGNOSIS — I214 Non-ST elevation (NSTEMI) myocardial infarction: Secondary | ICD-10-CM | POA: Diagnosis not present

## 2013-12-08 ENCOUNTER — Encounter (HOSPITAL_COMMUNITY): Payer: Medicare Other

## 2013-12-08 ENCOUNTER — Encounter: Payer: Self-pay | Admitting: Internal Medicine

## 2013-12-10 ENCOUNTER — Encounter (HOSPITAL_COMMUNITY): Payer: Medicare Other

## 2013-12-10 ENCOUNTER — Encounter (HOSPITAL_COMMUNITY)
Admission: RE | Admit: 2013-12-10 | Discharge: 2013-12-10 | Disposition: A | Discharge status: Home / Self Care | Payer: Medicare Other | Source: Ambulatory Visit | Attending: Cardiology | Admitting: Cardiology

## 2013-12-10 DIAGNOSIS — I214 Non-ST elevation (NSTEMI) myocardial infarction: Secondary | ICD-10-CM | POA: Diagnosis not present

## 2013-12-11 ENCOUNTER — Encounter: Payer: Self-pay | Admitting: Internal Medicine

## 2013-12-11 LAB — MDC_IDC_ENUM_SESS_TYPE_REMOTE
Date Time Interrogation Session: 20150521122217
Zone Setting Detection Interval: 2000 ms
Zone Setting Detection Interval: 3000 ms
Zone Setting Detection Interval: 370 ms

## 2013-12-12 ENCOUNTER — Encounter (HOSPITAL_COMMUNITY): Payer: Medicare Other

## 2013-12-12 ENCOUNTER — Encounter (HOSPITAL_COMMUNITY)
Admission: RE | Admit: 2013-12-12 | Discharge: 2013-12-12 | Disposition: A | Discharge status: Home / Self Care | Payer: Medicare Other | Source: Ambulatory Visit | Attending: Cardiology | Admitting: Cardiology

## 2013-12-12 DIAGNOSIS — I214 Non-ST elevation (NSTEMI) myocardial infarction: Secondary | ICD-10-CM | POA: Diagnosis not present

## 2013-12-15 ENCOUNTER — Encounter (HOSPITAL_COMMUNITY)
Admission: RE | Admit: 2013-12-15 | Discharge: 2013-12-15 | Disposition: A | Discharge status: Home / Self Care | Payer: Medicare Other | Source: Ambulatory Visit | Attending: Cardiology | Admitting: Cardiology

## 2013-12-15 ENCOUNTER — Encounter (HOSPITAL_COMMUNITY): Payer: Medicare Other

## 2013-12-15 ENCOUNTER — Encounter: Payer: Self-pay | Admitting: Internal Medicine

## 2013-12-15 DIAGNOSIS — I214 Non-ST elevation (NSTEMI) myocardial infarction: Secondary | ICD-10-CM | POA: Diagnosis not present

## 2013-12-17 ENCOUNTER — Encounter (HOSPITAL_COMMUNITY): Payer: Medicare Other

## 2013-12-17 ENCOUNTER — Encounter (HOSPITAL_COMMUNITY)
Admission: RE | Admit: 2013-12-17 | Discharge: 2013-12-17 | Disposition: A | Discharge status: Home / Self Care | Payer: Medicare Other | Source: Ambulatory Visit | Attending: Cardiology | Admitting: Cardiology

## 2013-12-17 DIAGNOSIS — I214 Non-ST elevation (NSTEMI) myocardial infarction: Secondary | ICD-10-CM | POA: Diagnosis not present

## 2013-12-19 ENCOUNTER — Encounter (HOSPITAL_COMMUNITY): Payer: Medicare Other

## 2013-12-19 ENCOUNTER — Ambulatory Visit (INDEPENDENT_AMBULATORY_CARE_PROVIDER_SITE_OTHER): Payer: Medicare Other | Admitting: *Deleted

## 2013-12-19 ENCOUNTER — Encounter (HOSPITAL_COMMUNITY)
Admission: RE | Admit: 2013-12-19 | Discharge: 2013-12-19 | Disposition: A | Discharge status: Home / Self Care | Payer: Medicare Other | Source: Ambulatory Visit | Attending: Cardiology | Admitting: Cardiology

## 2013-12-19 DIAGNOSIS — I214 Non-ST elevation (NSTEMI) myocardial infarction: Secondary | ICD-10-CM | POA: Diagnosis not present

## 2013-12-19 DIAGNOSIS — R55 Syncope and collapse: Secondary | ICD-10-CM

## 2013-12-22 ENCOUNTER — Encounter (HOSPITAL_COMMUNITY)
Admission: RE | Admit: 2013-12-22 | Discharge: 2013-12-22 | Disposition: A | Discharge status: Home / Self Care | Payer: Medicare Other | Source: Ambulatory Visit | Attending: Cardiology | Admitting: Cardiology

## 2013-12-22 ENCOUNTER — Encounter (HOSPITAL_COMMUNITY): Payer: Medicare Other

## 2013-12-22 DIAGNOSIS — I214 Non-ST elevation (NSTEMI) myocardial infarction: Secondary | ICD-10-CM | POA: Diagnosis not present

## 2013-12-23 LAB — MDC_IDC_ENUM_SESS_TYPE_REMOTE
Date Time Interrogation Session: 20150920232243
Zone Setting Detection Interval: 2000 ms
Zone Setting Detection Interval: 3000 ms
Zone Setting Detection Interval: 370 ms

## 2013-12-23 NOTE — Progress Notes (Signed)
Loop recorder 

## 2013-12-24 ENCOUNTER — Encounter (HOSPITAL_COMMUNITY)
Admission: RE | Admit: 2013-12-24 | Discharge: 2013-12-24 | Disposition: A | Discharge status: Home / Self Care | Payer: Medicare Other | Source: Ambulatory Visit | Attending: Cardiology | Admitting: Cardiology

## 2013-12-24 ENCOUNTER — Encounter (HOSPITAL_COMMUNITY): Payer: Medicare Other

## 2013-12-24 DIAGNOSIS — I214 Non-ST elevation (NSTEMI) myocardial infarction: Secondary | ICD-10-CM | POA: Diagnosis not present

## 2013-12-26 ENCOUNTER — Encounter: Payer: Self-pay | Admitting: Internal Medicine

## 2013-12-26 ENCOUNTER — Encounter (HOSPITAL_COMMUNITY)
Admission: RE | Admit: 2013-12-26 | Discharge: 2013-12-26 | Disposition: A | Discharge status: Home / Self Care | Payer: Medicare Other | Source: Ambulatory Visit | Attending: Cardiology | Admitting: Cardiology

## 2013-12-26 ENCOUNTER — Encounter (HOSPITAL_COMMUNITY): Payer: Medicare Other

## 2013-12-26 DIAGNOSIS — I214 Non-ST elevation (NSTEMI) myocardial infarction: Secondary | ICD-10-CM | POA: Diagnosis not present

## 2013-12-29 ENCOUNTER — Encounter (INDEPENDENT_AMBULATORY_CARE_PROVIDER_SITE_OTHER): Payer: Medicare Other | Admitting: Ophthalmology

## 2013-12-29 ENCOUNTER — Encounter (HOSPITAL_COMMUNITY)
Admission: RE | Admit: 2013-12-29 | Discharge: 2013-12-29 | Disposition: A | Discharge status: Home / Self Care | Payer: Medicare Other | Source: Ambulatory Visit | Attending: Cardiology | Admitting: Cardiology

## 2013-12-29 ENCOUNTER — Encounter (HOSPITAL_COMMUNITY): Payer: Medicare Other

## 2013-12-29 ENCOUNTER — Telehealth: Payer: Self-pay | Admitting: Cardiology

## 2013-12-29 DIAGNOSIS — H33009 Unspecified retinal detachment with retinal break, unspecified eye: Secondary | ICD-10-CM

## 2013-12-29 DIAGNOSIS — I214 Non-ST elevation (NSTEMI) myocardial infarction: Secondary | ICD-10-CM | POA: Diagnosis not present

## 2013-12-29 DIAGNOSIS — I10 Essential (primary) hypertension: Secondary | ICD-10-CM

## 2013-12-29 DIAGNOSIS — E1165 Type 2 diabetes mellitus with hyperglycemia: Secondary | ICD-10-CM

## 2013-12-29 DIAGNOSIS — E11319 Type 2 diabetes mellitus with unspecified diabetic retinopathy without macular edema: Secondary | ICD-10-CM

## 2013-12-29 DIAGNOSIS — E11311 Type 2 diabetes mellitus with unspecified diabetic retinopathy with macular edema: Secondary | ICD-10-CM

## 2013-12-29 DIAGNOSIS — E1139 Type 2 diabetes mellitus with other diabetic ophthalmic complication: Secondary | ICD-10-CM

## 2013-12-29 DIAGNOSIS — H35039 Hypertensive retinopathy, unspecified eye: Secondary | ICD-10-CM

## 2013-12-29 DIAGNOSIS — H43819 Vitreous degeneration, unspecified eye: Secondary | ICD-10-CM

## 2013-12-29 NOTE — Progress Notes (Signed)
Pt hypotensive post exercise, 90/58.  Pt c/o lightheadedness and fatigue with exercise. Pt drank 2 bottles of gatorade, one during exercise and one post exercise.  pc to Dr. Jacalyn Lefevre nurse Hilda Blades.  Rehab reports faxed for Dr. [name] review.

## 2013-12-29 NOTE — Telephone Encounter (Signed)
Pt is having low bp after exercise. They are going to fax over the notes for dr [name] to review

## 2013-12-30 NOTE — Telephone Encounter (Signed)
Fax reviewed by dr [name], no changes at this time.

## 2013-12-31 ENCOUNTER — Encounter (HOSPITAL_COMMUNITY): Payer: Medicare Other

## 2013-12-31 ENCOUNTER — Other Ambulatory Visit: Payer: Self-pay

## 2014-01-01 ENCOUNTER — Encounter (INDEPENDENT_AMBULATORY_CARE_PROVIDER_SITE_OTHER): Payer: Medicare Other | Admitting: Ophthalmology

## 2014-01-02 ENCOUNTER — Encounter: Payer: Self-pay | Admitting: Cardiology

## 2014-01-02 ENCOUNTER — Encounter (HOSPITAL_COMMUNITY): Payer: Medicare Other

## 2014-01-05 ENCOUNTER — Encounter (HOSPITAL_COMMUNITY): Payer: Medicare Other

## 2014-01-05 ENCOUNTER — Telehealth (HOSPITAL_COMMUNITY): Payer: Self-pay | Admitting: *Deleted

## 2014-01-05 NOTE — Telephone Encounter (Signed)
Called and returned phone message.  Pt for surgery on tomorrow.  Advised pt that we will need medical clearance and advisement of restrictions of activity. Cherre Huger, BSN

## 2014-01-06 ENCOUNTER — Telehealth: Payer: Self-pay | Admitting: *Deleted

## 2014-01-06 ENCOUNTER — Encounter: Payer: Medicare Other | Admitting: Internal Medicine

## 2014-01-06 HISTORY — PX: RETINAL DETACHMENT SURGERY: SHX105

## 2014-01-06 NOTE — Telephone Encounter (Signed)
Returned fax giving okay for patient to hold coumadin 5 days prior to procedure and resume the day after

## 2014-01-07 ENCOUNTER — Encounter (HOSPITAL_COMMUNITY)
Admission: RE | Admit: 2014-01-07 | Payer: Medicare Other | Source: Ambulatory Visit | Attending: Cardiology | Admitting: Cardiology

## 2014-01-07 ENCOUNTER — Encounter (HOSPITAL_COMMUNITY): Payer: Medicare Other

## 2014-01-09 ENCOUNTER — Encounter: Payer: Self-pay | Admitting: Internal Medicine

## 2014-01-09 ENCOUNTER — Encounter (HOSPITAL_COMMUNITY): Payer: Medicare Other

## 2014-01-09 ENCOUNTER — Encounter (HOSPITAL_COMMUNITY)
Admission: RE | Admit: 2014-01-09 | Payer: Medicare Other | Source: Ambulatory Visit | Attending: Cardiology | Admitting: Cardiology

## 2014-01-12 ENCOUNTER — Encounter: Payer: Self-pay | Admitting: Internal Medicine

## 2014-01-12 ENCOUNTER — Encounter (HOSPITAL_COMMUNITY): Payer: Medicare Other

## 2014-01-13 ENCOUNTER — Telehealth (HOSPITAL_COMMUNITY): Payer: Self-pay | Admitting: Cardiac Rehabilitation

## 2014-01-13 ENCOUNTER — Encounter: Payer: Medicare Other | Admitting: Internal Medicine

## 2014-01-13 NOTE — Telephone Encounter (Signed)
pc to pt to assess healing from recent eye surgery.  Pt states he has post op appt scheduled tomorrow.  Pt will request written release to return to exercise from his opthalmologist.

## 2014-01-14 ENCOUNTER — Encounter (HOSPITAL_COMMUNITY)
Admission: RE | Admit: 2014-01-14 | Payer: Medicare Other | Source: Ambulatory Visit | Attending: Cardiology | Admitting: Cardiology

## 2014-01-14 ENCOUNTER — Encounter (HOSPITAL_COMMUNITY): Payer: Medicare Other

## 2014-01-16 ENCOUNTER — Encounter (HOSPITAL_COMMUNITY): Payer: Medicare Other

## 2014-01-19 ENCOUNTER — Encounter (HOSPITAL_COMMUNITY)
Admission: RE | Admit: 2014-01-19 | Discharge: 2014-01-19 | Disposition: A | Discharge status: Home / Self Care | Payer: Medicare Other | Source: Ambulatory Visit | Attending: Cardiology | Admitting: Cardiology

## 2014-01-19 ENCOUNTER — Encounter (HOSPITAL_COMMUNITY): Payer: Medicare Other

## 2014-01-19 ENCOUNTER — Encounter: Payer: Self-pay | Admitting: Internal Medicine

## 2014-01-19 ENCOUNTER — Ambulatory Visit (INDEPENDENT_AMBULATORY_CARE_PROVIDER_SITE_OTHER): Payer: Medicare Other | Admitting: *Deleted

## 2014-01-19 DIAGNOSIS — I214 Non-ST elevation (NSTEMI) myocardial infarction: Secondary | ICD-10-CM | POA: Insufficient documentation

## 2014-01-19 DIAGNOSIS — Z951 Presence of aortocoronary bypass graft: Secondary | ICD-10-CM | POA: Diagnosis present

## 2014-01-19 DIAGNOSIS — R55 Syncope and collapse: Secondary | ICD-10-CM

## 2014-01-19 LAB — MDC_IDC_ENUM_SESS_TYPE_REMOTE
Date Time Interrogation Session: 20151015114439
Zone Setting Detection Interval: 2000 ms
Zone Setting Detection Interval: 3000 ms
Zone Setting Detection Interval: 370 ms

## 2014-01-21 ENCOUNTER — Encounter (HOSPITAL_COMMUNITY): Payer: Medicare Other

## 2014-01-21 ENCOUNTER — Encounter (HOSPITAL_COMMUNITY)
Admission: RE | Admit: 2014-01-21 | Discharge: 2014-01-21 | Disposition: A | Discharge status: Home / Self Care | Payer: Medicare Other | Source: Ambulatory Visit | Attending: Cardiology | Admitting: Cardiology

## 2014-01-23 ENCOUNTER — Other Ambulatory Visit: Payer: Self-pay

## 2014-01-23 ENCOUNTER — Encounter (HOSPITAL_COMMUNITY)
Admission: RE | Admit: 2014-01-23 | Discharge: 2014-01-23 | Disposition: A | Discharge status: Home / Self Care | Payer: Medicare Other | Source: Ambulatory Visit | Attending: Cardiology | Admitting: Cardiology

## 2014-01-23 ENCOUNTER — Encounter (HOSPITAL_COMMUNITY): Payer: Medicare Other

## 2014-01-23 ENCOUNTER — Emergency Department (HOSPITAL_COMMUNITY): Payer: Medicare Other

## 2014-01-23 ENCOUNTER — Ambulatory Visit (HOSPITAL_COMMUNITY)
Admission: EM | Admit: 2014-01-23 | Discharge: 2014-01-23 | Disposition: A | Discharge status: Home / Self Care | Payer: Medicare Other | Source: Home / Self Care | Attending: Family Medicine | Admitting: Family Medicine

## 2014-01-23 ENCOUNTER — Encounter (HOSPITAL_COMMUNITY): Payer: Self-pay | Admitting: Emergency Medicine

## 2014-01-23 ENCOUNTER — Inpatient Hospital Stay (HOSPITAL_COMMUNITY)
Admission: EM | Admit: 2014-01-23 | Discharge: 2014-01-27 | DRG: 246 | Disposition: A | Discharge status: Home / Self Care | Payer: Medicare Other | Attending: Cardiology | Admitting: Cardiology

## 2014-01-23 DIAGNOSIS — I255 Ischemic cardiomyopathy: Secondary | ICD-10-CM | POA: Diagnosis present

## 2014-01-23 DIAGNOSIS — I480 Paroxysmal atrial fibrillation: Secondary | ICD-10-CM | POA: Diagnosis present

## 2014-01-23 DIAGNOSIS — I25810 Atherosclerosis of coronary artery bypass graft(s) without angina pectoris: Secondary | ICD-10-CM | POA: Diagnosis present

## 2014-01-23 DIAGNOSIS — Z7901 Long term (current) use of anticoagulants: Secondary | ICD-10-CM

## 2014-01-23 DIAGNOSIS — Z955 Presence of coronary angioplasty implant and graft: Secondary | ICD-10-CM

## 2014-01-23 DIAGNOSIS — I959 Hypotension, unspecified: Secondary | ICD-10-CM | POA: Diagnosis not present

## 2014-01-23 DIAGNOSIS — I10 Essential (primary) hypertension: Secondary | ICD-10-CM

## 2014-01-23 DIAGNOSIS — R7989 Other specified abnormal findings of blood chemistry: Secondary | ICD-10-CM

## 2014-01-23 DIAGNOSIS — J45909 Unspecified asthma, uncomplicated: Secondary | ICD-10-CM | POA: Diagnosis present

## 2014-01-23 DIAGNOSIS — R9431 Abnormal electrocardiogram [ECG] [EKG]: Secondary | ICD-10-CM

## 2014-01-23 DIAGNOSIS — Z87891 Personal history of nicotine dependence: Secondary | ICD-10-CM

## 2014-01-23 DIAGNOSIS — I2582 Chronic total occlusion of coronary artery: Secondary | ICD-10-CM | POA: Diagnosis present

## 2014-01-23 DIAGNOSIS — I2510 Atherosclerotic heart disease of native coronary artery without angina pectoris: Secondary | ICD-10-CM

## 2014-01-23 DIAGNOSIS — I2583 Coronary atherosclerosis due to lipid rich plaque: Secondary | ICD-10-CM

## 2014-01-23 DIAGNOSIS — E7800 Pure hypercholesterolemia, unspecified: Secondary | ICD-10-CM | POA: Diagnosis present

## 2014-01-23 DIAGNOSIS — I517 Cardiomegaly: Secondary | ICD-10-CM

## 2014-01-23 DIAGNOSIS — I130 Hypertensive heart and chronic kidney disease with heart failure and stage 1 through stage 4 chronic kidney disease, or unspecified chronic kidney disease: Secondary | ICD-10-CM | POA: Diagnosis present

## 2014-01-23 DIAGNOSIS — I214 Non-ST elevation (NSTEMI) myocardial infarction: Principal | ICD-10-CM | POA: Diagnosis present

## 2014-01-23 DIAGNOSIS — E785 Hyperlipidemia, unspecified: Secondary | ICD-10-CM | POA: Diagnosis present

## 2014-01-23 DIAGNOSIS — E119 Type 2 diabetes mellitus without complications: Secondary | ICD-10-CM | POA: Diagnosis present

## 2014-01-23 DIAGNOSIS — Z8249 Family history of ischemic heart disease and other diseases of the circulatory system: Secondary | ICD-10-CM

## 2014-01-23 DIAGNOSIS — N189 Chronic kidney disease, unspecified: Secondary | ICD-10-CM | POA: Diagnosis present

## 2014-01-23 DIAGNOSIS — I25709 Atherosclerosis of coronary artery bypass graft(s), unspecified, with unspecified angina pectoris: Secondary | ICD-10-CM

## 2014-01-23 DIAGNOSIS — Z9229 Personal history of other drug therapy: Secondary | ICD-10-CM | POA: Insufficient documentation

## 2014-01-23 DIAGNOSIS — H9190 Unspecified hearing loss, unspecified ear: Secondary | ICD-10-CM | POA: Diagnosis present

## 2014-01-23 DIAGNOSIS — Z951 Presence of aortocoronary bypass graft: Secondary | ICD-10-CM

## 2014-01-23 DIAGNOSIS — Z9889 Other specified postprocedural states: Secondary | ICD-10-CM

## 2014-01-23 DIAGNOSIS — I252 Old myocardial infarction: Secondary | ICD-10-CM

## 2014-01-23 DIAGNOSIS — I5023 Acute on chronic systolic (congestive) heart failure: Secondary | ICD-10-CM | POA: Diagnosis present

## 2014-01-23 DIAGNOSIS — E1159 Type 2 diabetes mellitus with other circulatory complications: Secondary | ICD-10-CM

## 2014-01-23 DIAGNOSIS — I5021 Acute systolic (congestive) heart failure: Secondary | ICD-10-CM

## 2014-01-23 DIAGNOSIS — Z7982 Long term (current) use of aspirin: Secondary | ICD-10-CM | POA: Diagnosis not present

## 2014-01-23 DIAGNOSIS — I5022 Chronic systolic (congestive) heart failure: Secondary | ICD-10-CM

## 2014-01-23 DIAGNOSIS — I110 Hypertensive heart disease with heart failure: Secondary | ICD-10-CM

## 2014-01-23 DIAGNOSIS — R778 Other specified abnormalities of plasma proteins: Secondary | ICD-10-CM

## 2014-01-23 HISTORY — DX: Hyperlipidemia, unspecified: E78.5

## 2014-01-23 HISTORY — DX: Paroxysmal atrial fibrillation: I48.0

## 2014-01-23 HISTORY — DX: Other specified postprocedural states: Z98.890

## 2014-01-23 HISTORY — DX: Other specified postprocedural states: Z86.69

## 2014-01-23 LAB — COMPREHENSIVE METABOLIC PANEL
ALT: 20 U/L (ref 0–53)
AST: 38 U/L — ABNORMAL HIGH (ref 0–37)
Albumin: 3.2 g/dL — ABNORMAL LOW (ref 3.5–5.2)
Alkaline Phosphatase: 114 U/L (ref 39–117)
Anion gap: 17 — ABNORMAL HIGH (ref 5–15)
BUN: 12 mg/dL (ref 6–23)
CO2: 22 mEq/L (ref 19–32)
Calcium: 9.2 mg/dL (ref 8.4–10.5)
Chloride: 99 mEq/L (ref 96–112)
Creatinine, Ser: 1.22 mg/dL (ref 0.50–1.35)
GFR calc Af Amer: 68 mL/min — ABNORMAL LOW (ref >90)
GFR calc non Af Amer: 59 mL/min — ABNORMAL LOW (ref >90)
Glucose, Bld: 149 mg/dL — ABNORMAL HIGH (ref 70–99)
Potassium: 4.7 mEq/L (ref 3.7–5.3)
Sodium: 138 mEq/L (ref 137–147)
Total Bilirubin: 0.4 mg/dL (ref 0.3–1.2)
Total Protein: 7.1 g/dL (ref 6.0–8.3)

## 2014-01-23 LAB — CBC
HCT: 35 % — ABNORMAL LOW (ref 39.0–52.0)
Hemoglobin: 11.4 g/dL — ABNORMAL LOW (ref 13.0–17.0)
MCH: 27.9 pg (ref 26.0–34.0)
MCHC: 32.6 g/dL (ref 30.0–36.0)
MCV: 85.6 fL (ref 78.0–100.0)
Platelets: 314 10*3/uL (ref 150–400)
RBC: 4.09 MIL/uL — ABNORMAL LOW (ref 4.22–5.81)
RDW: 15.2 % (ref 11.5–15.5)
WBC: 5.6 10*3/uL (ref 4.0–10.5)

## 2014-01-23 LAB — TROPONIN I
Troponin I: 0.84 ng/mL (ref <0.30)
Troponin I: 0.97 ng/mL (ref <0.30)

## 2014-01-23 LAB — I-STAT TROPONIN, ED: Troponin i, poc: 0.66 ng/mL (ref 0.00–0.08)

## 2014-01-23 LAB — PROTIME-INR
INR: 1.89 — ABNORMAL HIGH (ref 0.00–1.49)
Prothrombin Time: 21.9 seconds — ABNORMAL HIGH (ref 11.6–15.2)

## 2014-01-23 LAB — MAGNESIUM: Magnesium: 2.2 mg/dL (ref 1.5–2.5)

## 2014-01-23 LAB — TSH: TSH: 8.57 u[IU]/mL — ABNORMAL HIGH (ref 0.350–4.500)

## 2014-01-23 LAB — MRSA PCR SCREENING: MRSA by PCR: NEGATIVE

## 2014-01-23 LAB — GLUCOSE, CAPILLARY
Glucose-Capillary: 101 mg/dL — ABNORMAL HIGH (ref 70–99)
Glucose-Capillary: 128 mg/dL — ABNORMAL HIGH (ref 70–99)

## 2014-01-23 MED ORDER — TAMSULOSIN HCL 0.4 MG PO CAPS
0.4000 mg | ORAL_CAPSULE | Freq: Every day | ORAL | Status: DC
Start: 2014-01-23 — End: 2014-01-27
  Administered 2014-01-23 – 2014-01-26 (×4): 0.4 mg via ORAL
  Filled 2014-01-23 (×5): qty 1

## 2014-01-23 MED ORDER — VITAMIN E 180 MG (400 UNIT) PO CAPS
400.0000 [IU] | ORAL_CAPSULE | Freq: Every day | ORAL | Status: DC
  Administered 2014-01-23 – 2014-01-25 (×3): 400 [IU] via ORAL
  Filled 2014-01-23 (×6): qty 1

## 2014-01-23 MED ORDER — OMEGA-3 FATTY ACIDS 1000 MG PO CAPS: 1.0000 g | ORAL_CAPSULE | Freq: Two times a day (BID) | ORAL | Status: DC

## 2014-01-23 MED ORDER — HEPARIN (PORCINE) IN NACL 100-0.45 UNIT/ML-% IJ SOLN
1300.0000 [IU]/h | INTRAMUSCULAR | Status: DC
  Administered 2014-01-23: 1000 [IU]/h via INTRAVENOUS
  Filled 2014-01-23 (×2): qty 250

## 2014-01-23 MED ORDER — VITAMIN C 500 MG PO TABS
1000.0000 mg | ORAL_TABLET | Freq: Every day | ORAL | Status: DC
  Administered 2014-01-24 – 2014-01-26 (×3): 1000 mg via ORAL
  Filled 2014-01-23 (×5): qty 2

## 2014-01-23 MED ORDER — PANTOPRAZOLE SODIUM 40 MG PO TBEC: 40.0000 mg | DELAYED_RELEASE_TABLET | Freq: Every day | ORAL | Status: DC

## 2014-01-23 MED ORDER — ADULT MULTIVITAMIN W/MINERALS CH
1.0000 | ORAL_TABLET | Freq: Every day | ORAL | Status: DC
  Administered 2014-01-24 – 2014-01-26 (×3): 1 via ORAL
  Filled 2014-01-23 (×4): qty 1

## 2014-01-23 MED ORDER — FUROSEMIDE 40 MG PO TABS: 40.0000 mg | ORAL_TABLET | Freq: Every day | ORAL | Status: DC

## 2014-01-23 MED ORDER — FUROSEMIDE 40 MG PO TABS
40.0000 mg | ORAL_TABLET | Freq: Every day | ORAL | Status: DC
  Administered 2014-01-24 – 2014-01-25 (×2): 40 mg via ORAL
  Filled 2014-01-23 (×3): qty 1

## 2014-01-23 MED ORDER — ATORVASTATIN CALCIUM 80 MG PO TABS
80.0000 mg | ORAL_TABLET | Freq: Every day | ORAL | Status: DC
  Administered 2014-01-24 – 2014-01-26 (×3): 80 mg via ORAL
  Filled 2014-01-23 (×5): qty 1

## 2014-01-23 MED ORDER — ZOLPIDEM TARTRATE 5 MG PO TABS: 5.0000 mg | ORAL_TABLET | Freq: Every evening | ORAL | Status: DC | PRN

## 2014-01-23 MED ORDER — TRAMADOL HCL 50 MG PO TABS
50.0000 mg | ORAL_TABLET | Freq: Four times a day (QID) | ORAL | Status: DC | PRN
  Administered 2014-01-25 – 2014-01-27 (×3): 50 mg via ORAL
  Filled 2014-01-23 (×4): qty 1

## 2014-01-23 MED ORDER — ALPRAZOLAM 0.25 MG PO TABS: 0.2500 mg | ORAL_TABLET | Freq: Two times a day (BID) | ORAL | Status: DC | PRN

## 2014-01-23 MED ORDER — INSULIN ASPART 100 UNIT/ML ~~LOC~~ SOLN
0.0000 [IU] | Freq: Three times a day (TID) | SUBCUTANEOUS | Status: DC
  Administered 2014-01-24 – 2014-01-25 (×3): 3 [IU] via SUBCUTANEOUS
  Filled 2014-01-23: qty 1

## 2014-01-23 MED ORDER — MOMETASONE FURO-FORMOTEROL FUM 100-5 MCG/ACT IN AERO
2.0000 | INHALATION_SPRAY | Freq: Two times a day (BID) | RESPIRATORY_TRACT | Status: DC
  Administered 2014-01-23 – 2014-01-27 (×8): 2 via RESPIRATORY_TRACT
  Filled 2014-01-23: qty 8.8

## 2014-01-23 MED ORDER — GLIPIZIDE 5 MG PO TABS
5.0000 mg | ORAL_TABLET | Freq: Every day | ORAL | Status: DC
  Administered 2014-01-24 – 2014-01-27 (×3): 5 mg via ORAL
  Filled 2014-01-23 (×5): qty 1

## 2014-01-23 MED ORDER — ASPIRIN 81 MG PO CHEW
324.0000 mg | CHEWABLE_TABLET | Freq: Once | ORAL | Status: AC
  Administered 2014-01-23: 324 mg via ORAL
  Filled 2014-01-23: qty 4

## 2014-01-23 MED ORDER — PANTOPRAZOLE SODIUM 40 MG PO TBEC
40.0000 mg | DELAYED_RELEASE_TABLET | Freq: Every day | ORAL | Status: DC
  Administered 2014-01-24 – 2014-01-26 (×3): 40 mg via ORAL
  Filled 2014-01-23 (×4): qty 1

## 2014-01-23 MED ORDER — ASPIRIN EC 81 MG PO TBEC
81.0000 mg | DELAYED_RELEASE_TABLET | Freq: Every morning | ORAL | Status: DC
  Administered 2014-01-24 – 2014-01-27 (×4): 81 mg via ORAL
  Filled 2014-01-23 (×4): qty 1

## 2014-01-23 MED ORDER — ACETAMINOPHEN 325 MG PO TABS: 650.0000 mg | ORAL_TABLET | ORAL | Status: DC | PRN

## 2014-01-23 MED ORDER — SODIUM CHLORIDE 0.9 % IJ SOLN
3.0000 mL | Freq: Two times a day (BID) | INTRAMUSCULAR | Status: DC
  Administered 2014-01-23 – 2014-01-26 (×4): 3 mL via INTRAVENOUS

## 2014-01-23 MED ORDER — FERROUS SULFATE 325 (65 FE) MG PO TABS
325.0000 mg | ORAL_TABLET | Freq: Every day | ORAL | Status: DC
  Administered 2014-01-24 – 2014-01-26 (×3): 325 mg via ORAL
  Filled 2014-01-23 (×6): qty 1

## 2014-01-23 MED ORDER — INSULIN ASPART 100 UNIT/ML ~~LOC~~ SOLN: 0.0000 [IU] | Freq: Every day | SUBCUTANEOUS | Status: DC

## 2014-01-23 MED ORDER — SODIUM CHLORIDE 0.9 % IV SOLN
250.0000 mL | INTRAVENOUS | Status: DC | PRN
  Administered 2014-01-25: 250 mL via INTRAVENOUS

## 2014-01-23 MED ORDER — TAMSULOSIN HCL 0.4 MG PO CAPS: 0.4000 mg | ORAL_CAPSULE | Freq: Every day | ORAL | Status: DC

## 2014-01-23 MED ORDER — ONDANSETRON HCL 4 MG/2ML IJ SOLN: 4.0000 mg | Freq: Four times a day (QID) | INTRAMUSCULAR | Status: DC | PRN

## 2014-01-23 MED ORDER — SODIUM CHLORIDE 0.9 % IJ SOLN: 3.0000 mL | INTRAMUSCULAR | Status: DC | PRN

## 2014-01-23 MED ORDER — OMEGA-3-ACID ETHYL ESTERS 1 G PO CAPS
1.0000 g | ORAL_CAPSULE | Freq: Two times a day (BID) | ORAL | Status: DC
  Administered 2014-01-23 – 2014-01-26 (×7): 1 g via ORAL
  Filled 2014-01-23 (×10): qty 1

## 2014-01-23 MED ORDER — CARVEDILOL 3.125 MG PO TABS
3.1250 mg | ORAL_TABLET | Freq: Two times a day (BID) | ORAL | Status: DC
  Administered 2014-01-23 – 2014-01-24 (×2): 3.125 mg via ORAL
  Filled 2014-01-23 (×4): qty 1

## 2014-01-23 MED ORDER — NITROGLYCERIN 0.4 MG SL SUBL: 0.4000 mg | SUBLINGUAL_TABLET | SUBLINGUAL | Status: DC | PRN

## 2014-01-23 NOTE — ED Notes (Signed)
Troponin results given to Dr. Beaton 

## 2014-01-23 NOTE — Progress Notes (Signed)
Pt became hypotensive and weak during bike test today at cardiac rehab.  Pt exercised 3 minutes then was unable to continue.  Pt alert and oriented x4.   Pt denies chest pain or dyspnea.  Skin-warm, diaphoretic,  Pale.  BP-74/46.  HR-118, non specific ST depression on monitor. CBG-163, O2 sat-100%.   Pt extremely weak 2 person assist transferring to wheelchair and stretcher.   Pt placed in Trendelenburg  position.  Recheck BP-102/62. Pt continues to c/o weakness and appears groggy, yet easily aroused. Rapid response RN present.  12 lead EKG obtained which revealed sinus rhythm,  increased ST depression in multiple leads compared to previous 12 lead.  Card master made aware pt to be transported to ED.  ED made aware.  Pt transported via stretcher to ED.  Pt declined offer to notify friend or relative of his transport to ED.  Emergency contact confirmed as pt daughter, ED made aware.

## 2014-01-23 NOTE — Progress Notes (Signed)
Utilization Review Completed.Donne Anon T10/23/2015
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
## 2014-01-23 NOTE — ED Notes (Signed)
Dr. Ron Parker at bedside for consult.

## 2014-01-23 NOTE — Progress Notes (Signed)
*  PRELIMINARY RESULTS* Echocardiogram 2D Echocardiogram has been performed.  Gerald Rosario 01/23/2014, 3:13 PM

## 2014-01-23 NOTE — Progress Notes (Signed)
ANTICOAGULATION CONSULT NOTE - Initial Consult  Pharmacy Consult for heparin Indication: chest pain/ACS  Allergies  Allergen Reactions  . Sulfa Antibiotics Swelling    Patient Measurements: Height: 5' 7.5" (171.5 cm) Weight: 155 lb 3.3 oz (70.4 kg) IBW/kg (Calculated) : 67.25  Vital Signs: Temp: 98.2 F (36.8 C) (10/23 1508) Temp Source: Oral (10/23 1508) BP: 125/75 mmHg (10/23 1508) Pulse Rate: 89 (10/23 1508)  Labs:  Recent Labs  01/23/14 0930  HGB 11.4*  HCT 35.0*  PLT 314  LABPROT 21.9*  INR 1.89*  CREATININE 1.22  TROPONINI 0.84*    Estimated Creatinine Clearance: 54.4 ml/min (by C-G formula based on Cr of 1.22).   Medical History: Past Medical History  Diagnosis Date  . Hypertension   . Asthma     start dulera 100 April 12,2011 > better but "knot in throat" so try qvar June 7,2011 > preferred dulera. HFA 90% May 10,2011 > 90% October 17,2011. PFT's June 7,2011 wnl x minimal nonspecific mid flow reduction while on dulera. Changed to advair intermediate strength October 17,2011 due to ins issue  . Ischemic cardiomyopathy     Repeat Cardiac MRI - EF 52%, distal Septal & apical Akinesis (suggest scar), unable to assess viability due to patient movement  . Hoarseness 12-20-11    onset 11/09. neg w/u 09/2008. saw Dr. Raelene Bott. 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 Syncope   . Enlarged prostate 12-20-11    hx. -has Foley cath at present for retention  . Dyspnea   . Pulmonary nodule   . Hyperlipidemia   . CAD (coronary artery disease) July 2012    Severe three-vessel disease - with occluded LAD and circumflex with 80% diagonal disease. Moderate disease in the ramus with diffusely  . H/O hyperkalemia   . Hearing loss   . Vocal cord dysfunction   . Enlarged prostate   . 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  . CHF (congestive heart failure) 12-20-11    Most recent echo 03/2012: EF improved to  55-60% with apical hypokinesis. Grade 2 diastolic dysfunction.  . Diabetes mellitus 12-20-11    Dabetes-many yrs-Lantus since 1 yr.  . Chronic kidney disease 12-20-11    past hx. with elevated Potassium-tx. meds, has no renal  MD, pt. states is improved.  . NSTEMI (non-ST elevated myocardial infarction)   . A-fib 08/24/2013  . Hemothorax on left 08/23/2013  . Syncope and collapse 08/20/2013  . NSTEMI (non-ST elevated myocardial infarction)   . Multiple fractures of ribs of left side 08/18/2013  . Chronic systolic heart failure 04/08/1759  . Diabetes mellitus 11/08/2010  . Coronary artery disease 11/07/2010    October 24, 2010   CONCLUSION:   1. Severe reduction in left ventricular systolic function, compatible       with severe ischemic cardiomyopathy.   2. Total occlusion of the left anterior descending artery with late       collaterals.   3. Total occlusion of the arteriovenous circumflex with late       collaterals.   4. Moderately high-grade disease of a segmentally diseased diagonal       branch.     Marland Kitchen HYPERTENSION 03/20/2007    Qualifier: Diagnosis of  By: Tilden Dome    . Hypercholesterolemia 12/09/2010  . PULMONARY NODULE, RIGHT MIDDLE LOBE 03/20/2007    Annotation: multiple Qualifier: Diagnosis of  By: Tilden Dome    . S/P CABG x 3  with evacuation of left hemothorax and clipping of LA appendage 08/28/2013    LIMA to LAD, SVG to Diag, SVG to Intermediate, EVH via right thigh and leg Clipping of LA appendage using 40 mm Aticure LA clip     Medications:  Prescriptions prior to admission  Medication Sig Dispense Refill  . Ascorbic Acid (VITAMIN C) 1000 MG tablet Take 1,000 mg by mouth daily.      Marland Kitchen aspirin 81 MG tablet Take 1 tablet (81 mg total) by mouth every morning.  30 tablet    . carvedilol (COREG) 3.125 MG tablet Take 3.125 mg by mouth 2 (two) times daily with a meal.       . ferrous sulfate 325 (65 FE) MG tablet Take 1 tablet (325 mg total) by mouth daily with breakfast.    3  . fish  oil-omega-3 fatty acids 1000 MG capsule Take 1 g by mouth 2 (two) times daily.       . fluticasone-salmeterol (ADVAIR HFA) 115-21 MCG/ACT inhaler Inhale 2 puffs into the lungs 2 (two) times daily.       . furosemide (LASIX) 40 MG tablet Take 40 mg by mouth.      Marland Kitchen glipiZIDE (GLUCOTROL) 5 MG tablet Take 5 mg by mouth daily before breakfast.      . Glucosamine-Chondroitin (OSTEO BI-FLEX REGULAR STRENGTH PO) Take 1 capsule by mouth 2 (two) times daily.       . Multiple Vitamin (MULTIVITAMIN) tablet Take 1 tablet by mouth daily.        . pantoprazole (PROTONIX) 40 MG tablet Take 40 mg by mouth daily.      . pravastatin (PRAVACHOL) 80 MG tablet Take 80 mg by mouth daily.      . tamsulosin (FLOMAX) 0.4 MG CAPS capsule Take 1 capsule (0.4 mg total) by mouth daily.  30 capsule    . traMADol (ULTRAM) 50 MG tablet Take 50 mg by mouth every 6 (six) hours as needed for pain.      . vitamin E 400 UNIT capsule Take 400 Units by mouth daily.      Marland Kitchen warfarin (COUMADIN) 6 MG tablet Take 6 mg by mouth daily.       Scheduled:  . [START ON 01/24/2014] aspirin EC  81 mg Oral q morning - 10a  . [START ON 01/24/2014] atorvastatin  80 mg Oral q1800  . carvedilol  3.125 mg Oral BID WC  . [START ON 01/24/2014] ferrous sulfate  325 mg Oral Q breakfast  . [START ON 01/24/2014] furosemide  40 mg Oral Daily  . [START ON 01/24/2014] glipiZIDE  5 mg Oral QAC breakfast  . insulin aspart  0-15 Units Subcutaneous TID WC  . insulin aspart  0-5 Units Subcutaneous QHS  . mometasone-formoterol  2 puff Inhalation BID  . [START ON 01/24/2014] multivitamin with minerals  1 tablet Oral Daily  . omega-3 acid ethyl esters  1 g Oral BID  . [START ON 01/24/2014] pantoprazole  40 mg Oral Daily  . sodium chloride  3 mL Intravenous Q12H  . tamsulosin  0.4 mg Oral Daily  . vitamin C  1,000 mg Oral Daily  . vitamin E  400 Units Oral Daily    Assessment: 68 yo male with NSTEMI to begin heparin (patient also noted with recent CABG   08/2013). Patient noted on coumadin PTA for afib and INR today is 1.89. Pharmacy consulted to begin heparin when INR < 2.0 (last coumadin dose taken 10/23).  Goal of Therapy:  Heparin level 0.3-0.7 units/ml Monitor platelets by anticoagulation protocol: Yes   Plan:  -No heparin bolus due to elevated INR -Begin heparin infusion at 1000 units/hr (~ 14 units/kg/hr) -Heparin level in 8hrs  Hildred Laser, Pharm D 01/23/2014 3:40 PM

## 2014-01-23 NOTE — ED Notes (Signed)
Pt to ED via stretcher from Cardiac rehab, pt was hypotensive during his bike test. Pt reports feeling "very weak." Denies chest pain or SOB. Per Arville Go RN from cardiac rehab states pt was diaphoretic, pale, and tachycardic. Pt's BP was 74/46. Pt reports feeling nauseous on arrival to ED, denied nausea during event. BP improved while lying flat on stretcher, 12 lead completed and with possible EKG changes. Pt reports a hx of "silent heart attacks." Pt alert and oriented x4. EKG completed by [facility] EMT upon arrival to ED
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
## 2014-01-23 NOTE — ED Notes (Signed)
MD at bedside. 

## 2014-01-23 NOTE — Progress Notes (Signed)
01/23/2014 1600  Pt did not want [place] to contact his family yet. Said he would if things change.me

## 2014-01-24 DIAGNOSIS — I480 Paroxysmal atrial fibrillation: Secondary | ICD-10-CM

## 2014-01-24 DIAGNOSIS — E1159 Type 2 diabetes mellitus with other circulatory complications: Secondary | ICD-10-CM

## 2014-01-24 DIAGNOSIS — Z951 Presence of aortocoronary bypass graft: Secondary | ICD-10-CM

## 2014-01-24 LAB — GLUCOSE, CAPILLARY
Glucose-Capillary: 120 mg/dL — ABNORMAL HIGH (ref 70–99)
Glucose-Capillary: 169 mg/dL — ABNORMAL HIGH (ref 70–99)
Glucose-Capillary: 106 mg/dL — ABNORMAL HIGH (ref 70–99)
Glucose-Capillary: 143 mg/dL — ABNORMAL HIGH (ref 70–99)

## 2014-01-24 LAB — COMPREHENSIVE METABOLIC PANEL
ALT: 17 U/L (ref 0–53)
AST: 28 U/L (ref 0–37)
Albumin: 3 g/dL — ABNORMAL LOW (ref 3.5–5.2)
Alkaline Phosphatase: 109 U/L (ref 39–117)
Anion gap: 9 (ref 5–15)
BUN: 10 mg/dL (ref 6–23)
CO2: 26 mEq/L (ref 19–32)
Calcium: 8.7 mg/dL (ref 8.4–10.5)
Chloride: 104 mEq/L (ref 96–112)
Creatinine, Ser: 1.08 mg/dL (ref 0.50–1.35)
GFR calc Af Amer: 79 mL/min — ABNORMAL LOW (ref >90)
GFR calc non Af Amer: 68 mL/min — ABNORMAL LOW (ref >90)
Glucose, Bld: 121 mg/dL — ABNORMAL HIGH (ref 70–99)
Potassium: 4.3 mEq/L (ref 3.7–5.3)
Sodium: 139 mEq/L (ref 137–147)
Total Bilirubin: 0.6 mg/dL (ref 0.3–1.2)
Total Protein: 6.6 g/dL (ref 6.0–8.3)

## 2014-01-24 LAB — CBC
HCT: 32 % — ABNORMAL LOW (ref 39.0–52.0)
Hemoglobin: 10.4 g/dL — ABNORMAL LOW (ref 13.0–17.0)
MCH: 27.2 pg (ref 26.0–34.0)
MCHC: 32.5 g/dL (ref 30.0–36.0)
MCV: 83.8 fL (ref 78.0–100.0)
Platelets: 310 10*3/uL (ref 150–400)
RBC: 3.82 MIL/uL — ABNORMAL LOW (ref 4.22–5.81)
RDW: 15.5 % (ref 11.5–15.5)
WBC: 4.4 10*3/uL (ref 4.0–10.5)

## 2014-01-24 LAB — HEMOGLOBIN A1C
Hgb A1c MFr Bld: 6.3 % — ABNORMAL HIGH (ref <5.7)
Mean Plasma Glucose: 134 mg/dL — ABNORMAL HIGH (ref <117)

## 2014-01-24 LAB — PROTIME-INR
INR: 2.25 — ABNORMAL HIGH (ref 0.00–1.49)
Prothrombin Time: 25 seconds — ABNORMAL HIGH (ref 11.6–15.2)

## 2014-01-24 LAB — LIPID PANEL
Cholesterol: 143 mg/dL (ref 0–200)
HDL: 46 mg/dL (ref >39)
LDL Cholesterol: 77 mg/dL (ref 0–99)
Total CHOL/HDL Ratio: 3.1 RATIO
Triglycerides: 98 mg/dL (ref <150)
VLDL: 20 mg/dL (ref 0–40)

## 2014-01-24 LAB — TROPONIN I
Troponin I: 1.31 ng/mL (ref <0.30)
Troponin I: 1.28 ng/mL (ref <0.30)

## 2014-01-24 LAB — HEPARIN LEVEL (UNFRACTIONATED)
Heparin Unfractionated: 0.18 IU/mL — ABNORMAL LOW (ref 0.30–0.70)
Heparin Unfractionated: <0.1 IU/mL — ABNORMAL LOW (ref 0.30–0.70)

## 2014-01-24 MED ORDER — CARVEDILOL 6.25 MG PO TABS
6.2500 mg | ORAL_TABLET | Freq: Two times a day (BID) | ORAL | Status: DC
Start: 2014-01-24 — End: 2014-01-27
  Administered 2014-01-24 – 2014-01-27 (×6): 6.25 mg via ORAL
  Filled 2014-01-24 (×4): qty 1
  Filled 2014-01-24: qty 2
  Filled 2014-01-24 (×5): qty 1

## 2014-01-24 NOTE — Progress Notes (Signed)
ANTICOAGULATION CONSULT NOTE - Follow Up Consult  Pharmacy Consult for heparin Indication: NSTEMI  Labs:  Recent Labs  01/23/14 0930 01/23/14 1930 01/23/14 2345  HGB 11.4*  --   --   HCT 35.0*  --   --   PLT 314  --   --   LABPROT 21.9*  --   --   INR 1.89*  --   --   HEPARINUNFRC  --   --  <0.10*  CREATININE 1.22  --   --   TROPONINI 0.84* 0.97*  --     Assessment: 68yo male undetectable on heparin with initial dosing for NSTEMI, on Coumadin PTA for Afib but may be stopped if Afib is not recurrent.  Goal of Therapy:  Heparin level 0.3-0.7 units/ml   Plan:  Will increase heparin gtt by 4 units/kg/hr to 1300 units/hr and check level in [place], PharmD, BCPS  01/24/2014,12:45 AM

## 2014-01-24 NOTE — Progress Notes (Signed)
ANTICOAGULATION CONSULT NOTE - Follow Up Consult  Pharmacy Consult for heparin Indication: NSTEMI  Labs:  Recent Labs  01/23/14 0930 01/23/14 1930 01/23/14 2345 01/24/14 0100 01/24/14 0710 01/24/14 0817  HGB 11.4*  --   --   --  10.4*  --   HCT 35.0*  --   --   --  32.0*  --   PLT 314  --   --   --  310  --   LABPROT 21.9*  --   --   --  25.0*  --   INR 1.89*  --   --   --  2.25*  --   HEPARINUNFRC  --   --  <0.10*  --   --  0.18*  CREATININE 1.22  --   --   --  1.08  --   TROPONINI 0.84* 0.97*  --  1.31* 1.28*  --     Assessment: 68 yo male admitted on 01/23/2014 with NSTEMI to begin heparin (patient also noted with recent CABG 08/2013). Patient was on Coumadin PTA for PAF. HL remains SUBtherapeutic at 0.18 but INR today is >2 which is likely the effect of Coumadin taken by patient yesterday prior to admission. Will d/c heparin gtt for now and reassess in am for restart if INR <2. H/H has trended down slightly, plt remain wnl and stable. Will continue to monitor closely.    Goal of Therapy:  Heparin level 0.3-0.7 units/ml   Plan:  - D/c heparin gtt since INR >2 - F/u am INR for restart heparin - Monitor CBC and s/s bleeding  Harolyn Rutherford, PharmD Clinical Pharmacist - Resident Pager: [phone] Pharmacy: [phone] 01/24/2014 9:34 AM

## 2014-01-24 NOTE — Progress Notes (Signed)
SUBJECTIVE: The patient is doing well today.  At this time, he denies chest pain, shortness of breath, or any new concerns.  Marland Kitchen aspirin EC  81 mg Oral q morning - 10a  . atorvastatin  80 mg Oral q1800  . carvedilol  3.125 mg Oral BID WC  . ferrous sulfate  325 mg Oral Q breakfast  . furosemide  40 mg Oral Daily  . glipiZIDE  5 mg Oral QAC breakfast  . insulin aspart  0-15 Units Subcutaneous TID WC  . insulin aspart  0-5 Units Subcutaneous QHS  . mometasone-formoterol  2 puff Inhalation BID  . multivitamin with minerals  1 tablet Oral Daily  . omega-3 acid ethyl esters  1 g Oral BID  . pantoprazole  40 mg Oral Daily  . sodium chloride  3 mL Intravenous Q12H  . tamsulosin  0.4 mg Oral Daily  . vitamin C  1,000 mg Oral Daily  . vitamin E  400 Units Oral Daily      OBJECTIVE: Physical Exam: Filed Vitals:   01/24/14 1059 01/24/14 1100 01/24/14 1129 01/24/14 1200  BP: 111/64 111/64  121/72  Pulse: 75 75  74  Temp: 98.2 F (36.8 C)     TempSrc: Oral     Resp: 14     Height:      Weight:      SpO2: 98% 99% 99% 99%    Intake/Output Summary (Last 24 hours) at 01/24/14 1234 Last data filed at 01/24/14 1200  Gross per 24 hour  Intake 1274.1 ml  Output   2150 ml  Net -875.9 ml    Telemetry reveals sinus rhythm  GEN- The patient is well appearing, alert and oriented x 3 today.   Head- normocephalic, atraumatic Eyes-  Sclera clear, conjunctiva pink Ears- hearing intact Oropharynx- clear Neck- supple, no JVP Lymph- no cervical lymphadenopathy Lungs- Clear to ausculation bilaterally, normal work of breathing Heart- Regular rate and rhythm, no murmurs, rubs or gallops, PMI not laterally displaced GI- soft, NT, ND, + BS Extremities- no clubbing, cyanosis, or edema Skin- no rash or lesion Psych- euthymic mood, full affect Neuro- strength and sensation are intact  LABS: Basic Metabolic Panel:  Recent Labs  01/23/14 0930 01/23/14 1930 01/24/14 0710  NA 138  --  139    K 4.7  --  4.3  CL 99  --  104  CO2 22  --  26  GLUCOSE 149*  --  121*  BUN 12  --  10  CREATININE 1.22  --  1.08  CALCIUM 9.2  --  8.7  MG  --  2.2  --    Liver Function Tests:  Recent Labs  01/23/14 0930 01/24/14 0710  AST 38* 28  ALT 20 17  ALKPHOS 114 109  BILITOT 0.4 0.6  PROT 7.1 6.6  ALBUMIN 3.2* 3.0*   No results found for this basename: LIPASE, AMYLASE,  in the last 72 hours CBC:  Recent Labs  01/23/14 0930 01/24/14 0710  WBC 5.6 4.4  HGB 11.4* 10.4*  HCT 35.0* 32.0*  MCV 85.6 83.8  PLT 314 310   Cardiac Enzymes:  Recent Labs  01/23/14 1930 01/24/14 0100 01/24/14 0710  TROPONINI 0.97* 1.31* 1.28*   BNP: No components found with this basename: POCBNP,  D-Dimer: No results found for this basename: DDIMER,  in the last 72 hours Hemoglobin A1C:  Recent Labs  01/23/14 1930  HGBA1C 6.3*   Fasting Lipid Panel:  Recent Labs  01/24/14 0224  CHOL 143  HDL 46  LDLCALC 77  TRIG 98  CHOLHDL 3.1   Thyroid Function Tests:  Recent Labs  01/23/14 1442  TSH 8.570*   Anemia Panel: No results found for this basename: VITAMINB12, FOLATE, FERRITIN, TIBC, IRON, RETICCTPCT,  in the last 72 hours  RADIOLOGY: Dg Chest 2 View  01/23/2014   CLINICAL DATA:  Acute onset hypotension and weakness. Event occurred at cardiac rehab. Initial encounter.  EXAM: CHEST  2 VIEW  COMPARISON:  09/29/2013.  FINDINGS: There is new interstitial pulmonary edema with Kerley B lines at the periphery. Event recorder is present with LEFT atrial clip. Median sternotomy/CABG. No focal consolidation. LEFT basilar scarring associated with CABG. No effusion. Aortic arch atherosclerosis.  IMPRESSION: 1. New interstitial pulmonary edema. 2. Postoperative changes of CABG.   Electronically Signed   By: Dereck Ligas M.D.   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 NSTEMI/ CAD s/p CABG Clinically stable at this time without symptoms presently He did rule in for MI Will transfer to telemetry and continue heparin drip for another 24 hours Will need to consult with primary cardiologist [name]) on Monday to decide whether or not to proceed with cath or treat medically.  Continue to optimize medicine regimen over the weekend I will increase coreg today.  EKG is reviewed and does not reveal ST elevation at his time.    2. Post operative AF Coumadin is on hold Amiodarone has been discontinued Will follow with is ILR  3. Hypertensive cardiovascular disease Increase coreg as above  4. DM with vascular (CAD) complications I have reviewed blood sugars and will continue to manage  Transfer to telemetry  Thompson Grayer, MD 01/24/2014 12:34 PM

## 2014-01-25 DIAGNOSIS — I25709 Atherosclerosis of coronary artery bypass graft(s), unspecified, with unspecified angina pectoris: Secondary | ICD-10-CM

## 2014-01-25 DIAGNOSIS — I5021 Acute systolic (congestive) heart failure: Secondary | ICD-10-CM

## 2014-01-25 DIAGNOSIS — Z9889 Other specified postprocedural states: Secondary | ICD-10-CM

## 2014-01-25 DIAGNOSIS — I509 Heart failure, unspecified: Secondary | ICD-10-CM

## 2014-01-25 DIAGNOSIS — R9431 Abnormal electrocardiogram [ECG] [EKG]: Secondary | ICD-10-CM

## 2014-01-25 DIAGNOSIS — I110 Hypertensive heart disease with heart failure: Secondary | ICD-10-CM

## 2014-01-25 DIAGNOSIS — I255 Ischemic cardiomyopathy: Secondary | ICD-10-CM

## 2014-01-25 DIAGNOSIS — Z7901 Long term (current) use of anticoagulants: Secondary | ICD-10-CM

## 2014-01-25 DIAGNOSIS — E780 Pure hypercholesterolemia: Secondary | ICD-10-CM

## 2014-01-25 DIAGNOSIS — I10 Essential (primary) hypertension: Secondary | ICD-10-CM

## 2014-01-25 LAB — GLUCOSE, CAPILLARY
Glucose-Capillary: 124 mg/dL — ABNORMAL HIGH (ref 70–99)
Glucose-Capillary: 156 mg/dL — ABNORMAL HIGH (ref 70–99)
Glucose-Capillary: 158 mg/dL — ABNORMAL HIGH (ref 70–99)
Glucose-Capillary: 84 mg/dL (ref 70–99)

## 2014-01-25 LAB — CBC
HCT: 32.8 % — ABNORMAL LOW (ref 39.0–52.0)
Hemoglobin: 10.3 g/dL — ABNORMAL LOW (ref 13.0–17.0)
MCH: 26.6 pg (ref 26.0–34.0)
MCHC: 31.4 g/dL (ref 30.0–36.0)
MCV: 84.8 fL (ref 78.0–100.0)
Platelets: 306 10*3/uL (ref 150–400)
RBC: 3.87 MIL/uL — ABNORMAL LOW (ref 4.22–5.81)
RDW: 15.4 % (ref 11.5–15.5)
WBC: 5.1 10*3/uL (ref 4.0–10.5)

## 2014-01-25 LAB — PROTIME-INR
INR: 1.77 — ABNORMAL HIGH (ref 0.00–1.49)
Prothrombin Time: 20.8 seconds — ABNORMAL HIGH (ref 11.6–15.2)

## 2014-01-25 LAB — HEPARIN LEVEL (UNFRACTIONATED)
Heparin Unfractionated: 0.39 IU/mL (ref 0.30–0.70)
Heparin Unfractionated: 0.63 IU/mL (ref 0.30–0.70)

## 2014-01-25 MED ORDER — HEPARIN (PORCINE) IN NACL 100-0.45 UNIT/ML-% IJ SOLN
1500.0000 [IU]/h | INTRAMUSCULAR | Status: DC
  Administered 2014-01-25 – 2014-01-26 (×2): 1500 [IU]/h via INTRAVENOUS
  Filled 2014-01-25 (×3): qty 250

## 2014-01-25 MED ORDER — ASPIRIN 81 MG PO CHEW
81.0000 mg | CHEWABLE_TABLET | ORAL | Status: AC
  Administered 2014-01-26: 81 mg via ORAL
  Filled 2014-01-25: qty 1

## 2014-01-25 MED ORDER — SODIUM CHLORIDE 0.9 % IJ SOLN: 3.0000 mL | Freq: Two times a day (BID) | INTRAMUSCULAR | Status: DC

## 2014-01-25 MED ORDER — SODIUM CHLORIDE 0.9 % IV SOLN: INTRAVENOUS | Status: DC

## 2014-01-25 MED ORDER — SODIUM CHLORIDE 0.9 % IJ SOLN: 3.0000 mL | INTRAMUSCULAR | Status: DC | PRN

## 2014-01-25 MED ORDER — SODIUM CHLORIDE 0.9 % IV SOLN: 250.0000 mL | INTRAVENOUS | Status: DC | PRN

## 2014-01-25 NOTE — Progress Notes (Signed)
ANTICOAGULATION CONSULT NOTE - Follow Up Consult  Pharmacy Consult for heparin Indication: atrial fibrillation and NSTEMI  Labs:  Recent Labs  01/23/14 0930 01/23/14 1930 01/23/14 2345 01/24/14 0100 01/24/14 0710 01/24/14 0817 01/25/14 0457  HGB 11.4*  --   --   --  10.4*  --  10.3*  HCT 35.0*  --   --   --  32.0*  --  32.8*  PLT 314  --   --   --  310  --  306  LABPROT 21.9*  --   --   --  25.0*  --  20.8*  INR 1.89*  --   --   --  2.25*  --  1.77*  HEPARINUNFRC  --   --  <0.10*  --   --  0.18*  --   CREATININE 1.22  --   --   --  1.08  --   --   TROPONINI 0.84* 0.97*  --  1.31* 1.28*  --   --     Assessment: 68yo male w/ INR back down to <2, to resume heparin; was previously subtherapeutic prior to being d/c'd.  Goal of Therapy:  Heparin level 0.3-0.7 units/ml   Plan:  Will resume heparin gtt at 1500 units/hr and monitor heparin levels and CBC.  Wynona Neat, PharmD, BCPS  01/25/2014,6:41 AM

## 2014-01-25 NOTE — Progress Notes (Addendum)
ANTICOAGULATION CONSULT NOTE - Follow Up Consult  Pharmacy Consult for heparin Indication: atrial fibrillation/NSTEMI  Allergies  Allergen Reactions  . Sulfa Antibiotics Swelling    Patient Measurements: Height: 5\' 7"  (170.2 cm) Weight: 151 lb 14.4 oz (68.901 kg) IBW/kg (Calculated) : 66.1  Vital Signs: Temp: 98.8 F (37.1 C) (10/25 1406) Temp Source: Oral (10/25 1406) BP: 120/66 mmHg (10/25 1406) Pulse Rate: 70 (10/25 1406)  Labs:  Recent Labs  01/23/14 0930 01/23/14 1930 01/23/14 2345 01/24/14 0100 01/24/14 0710 01/24/14 0817 01/25/14 0457 01/25/14 1500  HGB 11.4*  --   --   --  10.4*  --  10.3*  --   HCT 35.0*  --   --   --  32.0*  --  32.8*  --   PLT 314  --   --   --  310  --  306  --   LABPROT 21.9*  --   --   --  25.0*  --  20.8*  --   INR 1.89*  --   --   --  2.25*  --  1.77*  --   HEPARINUNFRC  --   --  <0.10*  --   --  0.18*  --  0.39  CREATININE 1.22  --   --   --  1.08  --   --   --   TROPONINI 0.84* 0.97*  --  1.31* 1.28*  --   --   --     Estimated Creatinine Clearance: 60.4 ml/min (by C-G formula based on Cr of 1.08).   Medications:  Infusions:  . heparin 1,500 Units/hr (01/25/14 1100)    Assessment: 67 yo M admitted 10/23 with NSTEMI started on heparin which was stopped when INR >2. This mornings INR was subtherapeutic and the heparin infusion was started again. 8hr HL therapeutic at 0.39. Hgb.Hct low, stable, PLTC wnl. No bleeding noted. Warfarin is currently on hold. PTA dose is 6mg  daily.  Goal of Therapy:  Heparin level 0.3-0.7 units/ml Monitor platelets by anticoagulation protocol: Yes   Plan:  Continue heparin gtt at 1500 units/hr 6hr HL @ 2100 to confirm Daily HL/CBC F/u plans for cath  Thank you for allowing pharmacy to be part of this patient's care team  [place], Pharm.D Clinical Pharmacy Resident Pager: [phone] 01/25/2014 .3:55 PM     Addum;  Repeat heparin level 0.39 units/ml  Cont drip at 1500 units/hr.   F/u am labs  Excell Seltzer, PharmD

## 2014-01-25 NOTE — Progress Notes (Signed)
SUBJECTIVE: Pt feels well and denies chest pain, SOB, and palpitations. Denies weakness. Good appetite.     Intake/Output Summary (Last 24 hours) at 01/25/14 1135 Last data filed at 01/25/14 0815  Gross per 24 hour  Intake    587 ml  Output      0 ml  Net    587 ml    Current Facility-Administered Medications  Medication Dose Route Frequency Provider Last Rate Last Dose  . 0.9 %  sodium chloride infusion  250 mL Intravenous PRN Evelene Croon Barrett, PA-C 10 mL/hr at 01/25/14 1005 250 mL at 01/25/14 1005  . acetaminophen (TYLENOL) tablet 650 mg  650 mg Oral Q4H PRN Rhonda G Barrett, PA-C      . ALPRAZolam (XANAX) tablet 0.25 mg  0.25 mg Oral BID PRN Evelene Croon Barrett, PA-C      . aspirin EC tablet 81 mg  81 mg Oral q morning - 10a Rhonda G Barrett, PA-C   81 mg at 01/25/14 1002  . atorvastatin (LIPITOR) tablet 80 mg  80 mg Oral q1800 Rhonda G Barrett, PA-C   80 mg at 01/24/14 1826  . carvedilol (COREG) tablet 6.25 mg  6.25 mg Oral BID WC Thompson Grayer, MD   6.25 mg at 01/25/14 0813  . ferrous sulfate tablet 325 mg  325 mg Oral Q breakfast Evelene Croon Barrett, PA-C   325 mg at 01/25/14 0813  . furosemide (LASIX) tablet 40 mg  40 mg Oral Daily Carlena Bjornstad, MD   40 mg at 01/25/14 1001  . glipiZIDE (GLUCOTROL) tablet 5 mg  5 mg Oral QAC breakfast Evelene Croon Barrett, PA-C   5 mg at 01/25/14 3329  . heparin ADULT infusion 100 units/mL (25000 units/250 mL)  1,500 Units/hr Intravenous Continuous Rogue Bussing, RPH 15 mL/hr at 01/25/14 1005 1,500 Units/hr at 01/25/14 1005  . insulin aspart (novoLOG) injection 0-15 Units  0-15 Units Subcutaneous TID WC Rhonda G Barrett, PA-C   3 Units at 01/24/14 1231  . insulin aspart (novoLOG) injection 0-5 Units  0-5 Units Subcutaneous QHS Rhonda G Barrett, PA-C      . mometasone-formoterol (DULERA) 100-5 MCG/ACT inhaler 2 puff  2 puff Inhalation BID Evelene Croon Barrett, PA-C   2 puff at 01/25/14 0906  . multivitamin with minerals tablet 1 tablet  1  tablet Oral Daily Evelene Croon Barrett, PA-C   1 tablet at 01/25/14 1002  . nitroGLYCERIN (NITROSTAT) SL tablet 0.4 mg  0.4 mg Sublingual Q5 Min x 3 PRN Rhonda G Barrett, PA-C      . omega-3 acid ethyl esters (LOVAZA) capsule 1 g  1 g Oral BID Carlena Bjornstad, MD   1 g at 01/25/14 1002  . ondansetron (ZOFRAN) injection 4 mg  4 mg Intravenous Q6H PRN Rhonda G Barrett, PA-C      . pantoprazole (PROTONIX) EC tablet 40 mg  40 mg Oral Daily Carlena Bjornstad, MD   40 mg at 01/25/14 5188  . sodium chloride 0.9 % injection 3 mL  3 mL Intravenous Q12H Rhonda G Barrett, PA-C   3 mL at 01/25/14 1003  . sodium chloride 0.9 % injection 3 mL  3 mL Intravenous PRN Rhonda G Barrett, PA-C      . tamsulosin (FLOMAX) capsule 0.4 mg  0.4 mg Oral Daily Carlena Bjornstad, MD   0.4 mg at 01/25/14 1002  . traMADol (ULTRAM) tablet 50 mg  50 mg Oral Q6H PRN Evelene Croon  Barrett, PA-C      . vitamin C (ASCORBIC ACID) tablet 1,000 mg  1,000 mg Oral Daily Rhonda G Barrett, PA-C   1,000 mg at 01/25/14 1001  . vitamin E capsule 400 Units  400 Units Oral Daily Evelene Croon Barrett, PA-C   400 Units at 01/25/14 1002  . zolpidem (AMBIEN) tablet 5 mg  5 mg Oral QHS PRN Lonn Georgia, PA-C        Filed Vitals:   01/24/14 1931 01/25/14 0507 01/25/14 0812 01/25/14 0906  BP: 138/76 124/66 125/62   Pulse: 77 71 73   Temp: 98.6 F (37 C) 98.5 F (36.9 C)    TempSrc: Oral Oral    Resp:      Height:      Weight:  151 lb 14.4 oz (68.901 kg)    SpO2: 100% 100%  98%    PHYSICAL EXAM General: NAD HEENT: Normal. Neck: No JVD, no thyromegaly.  Lungs: Clear to auscultation bilaterally with normal respiratory effort. CV: Nondisplaced PMI.  Regular rate and rhythm, normal S1/S2, no S3/S4, no murmur.  No pretibial edema.  No carotid bruit.  Normal pedal pulses.  Abdomen: Soft, nontender, no hepatosplenomegaly, no distention.  Neurologic: Alert and oriented x 3.  Psych: Normal affect. Musculoskeletal: Normal range of motion. No gross  deformities. Extremities: No clubbing or cyanosis.   TELEMETRY: Reviewed telemetry pt in sinus rhythm.  LABS: Basic Metabolic Panel:  Recent Labs  01/23/14 0930 01/23/14 1930 01/24/14 0710  NA 138  --  139  K 4.7  --  4.3  CL 99  --  104  CO2 22  --  26  GLUCOSE 149*  --  121*  BUN 12  --  10  CREATININE 1.22  --  1.08  CALCIUM 9.2  --  8.7  MG  --  2.2  --    Liver Function Tests:  Recent Labs  01/23/14 0930 01/24/14 0710  AST 38* 28  ALT 20 17  ALKPHOS 114 109  BILITOT 0.4 0.6  PROT 7.1 6.6  ALBUMIN 3.2* 3.0*   No results found for this basename: LIPASE, AMYLASE,  in the last 72 hours CBC:  Recent Labs  01/24/14 0710 01/25/14 0457  WBC 4.4 5.1  HGB 10.4* 10.3*  HCT 32.0* 32.8*  MCV 83.8 84.8  PLT 310 306   Cardiac Enzymes:  Recent Labs  01/23/14 1930 01/24/14 0100 01/24/14 0710  TROPONINI 0.97* 1.31* 1.28*   BNP: No components found with this basename: POCBNP,  D-Dimer: No results found for this basename: DDIMER,  in the last 72 hours Hemoglobin A1C:  Recent Labs  01/23/14 1930  HGBA1C 6.3*   Fasting Lipid Panel:  Recent Labs  01/24/14 0224  CHOL 143  HDL 46  LDLCALC 77  TRIG 98  CHOLHDL 3.1   Thyroid Function Tests:  Recent Labs  01/23/14 1442  TSH 8.570*   Anemia Panel: No results found for this basename: VITAMINB12, FOLATE, FERRITIN, TIBC, IRON, RETICCTPCT,  in the last 72 hours  RADIOLOGY: Dg Chest 2 View  01/23/2014   CLINICAL DATA:  Acute onset hypotension and weakness. Event occurred at cardiac rehab. Initial encounter.  EXAM: CHEST  2 VIEW  COMPARISON:  09/29/2013.  FINDINGS: There is new interstitial pulmonary edema with Kerley B lines at the periphery. Event recorder is present with LEFT atrial clip. Median sternotomy/CABG. No focal consolidation. LEFT basilar scarring associated with CABG. No effusion. Aortic arch atherosclerosis.  IMPRESSION: 1. New interstitial pulmonary  edema. 2. Postoperative changes of  CABG.   Electronically Signed   By: Dereck Ligas M.D.   On: 01/23/2014 10:04      ASSESSMENT AND PLAN: 1. NSTEMI: Pt had mild elevation of troponin (peaked at 1.31, latest 1.28) associated with weakness while exercising in cardiac rehab. Never had chest pain before NSTEMI and CABG. SBP noted to be 75 mmHg in rehab at time of feeling weak. It was initially felt on admission that ECG demonstrated QRS widening with IVCD and nonspecific ST-T abnormality, and coronary angiography should potentially be pursued. However, upon review of prior ECG's dating back to 5/23, similar pattern is seen.  However, echocardiogram was performed on 10/23 which demonstrated mild to moderate hypokinesis of the inferolateral wall, basal inferior segment, and basal anterolateral segment, which are reportedly new since the echo done just before his surgery in May 2015. The estimated EF was 50% (echo on 5/22, EF 60-65%). Given his symptoms, troponin elevation, reduction in EF, and new wall motion abnormalities, I feel the best course of action would be to pursue coronary/graft angiography to make certain he has not developed an occlusion of a bypass graft. For the time being, continue heparin, ASA, Lipitor, and Coreg. 2. Pulmonary edema/acute systolic HF: This was seen by CXR on 10/23. Lungs are presently clear and he remains on Lasix 40 mg daily. 3. Essential HTN: Coreg increased yesterday, BP better controlled today. 4. Paroxysmal atrial fibrillation: Has ILR. Warfarin on hold. No longer on amiodarone. 5. Type 2 diabetes: HbA1C 6.3% on 10/23, indicating good control. Continue present therapy with glipizide and insulin. 6. Hyperlipidemia: Well controlled lipids on 10/24 (LDL 77), continue with Lipitor 80 mg.    Kate Sable, M.D., F.A.C.C.

## 2014-01-26 ENCOUNTER — Encounter (HOSPITAL_COMMUNITY)
Admission: EM | Disposition: A | Discharge status: Home / Self Care | Payer: Medicare Other | Source: Home / Self Care | Attending: Cardiology

## 2014-01-26 ENCOUNTER — Encounter (HOSPITAL_COMMUNITY): Payer: Medicare Other

## 2014-01-26 DIAGNOSIS — I2510 Atherosclerotic heart disease of native coronary artery without angina pectoris: Secondary | ICD-10-CM

## 2014-01-26 DIAGNOSIS — I214 Non-ST elevation (NSTEMI) myocardial infarction: Principal | ICD-10-CM

## 2014-01-26 HISTORY — PX: LEFT HEART CATHETERIZATION WITH CORONARY/GRAFT ANGIOGRAM: SHX5450

## 2014-01-26 LAB — GLUCOSE, CAPILLARY
Glucose-Capillary: 163 mg/dL — ABNORMAL HIGH (ref 70–99)
Glucose-Capillary: 125 mg/dL — ABNORMAL HIGH (ref 70–99)
Glucose-Capillary: 124 mg/dL — ABNORMAL HIGH (ref 70–99)
Glucose-Capillary: 101 mg/dL — ABNORMAL HIGH (ref 70–99)
Glucose-Capillary: 142 mg/dL — ABNORMAL HIGH (ref 70–99)

## 2014-01-26 LAB — CBC
HCT: 32.1 % — ABNORMAL LOW (ref 39.0–52.0)
Hemoglobin: 10.4 g/dL — ABNORMAL LOW (ref 13.0–17.0)
MCH: 27.2 pg (ref 26.0–34.0)
MCHC: 32.4 g/dL (ref 30.0–36.0)
MCV: 83.8 fL (ref 78.0–100.0)
Platelets: 316 10*3/uL (ref 150–400)
RBC: 3.83 MIL/uL — ABNORMAL LOW (ref 4.22–5.81)
RDW: 15.3 % (ref 11.5–15.5)
WBC: 5.7 10*3/uL (ref 4.0–10.5)

## 2014-01-26 LAB — POCT ACTIVATED CLOTTING TIME: Activated Clotting Time: 371 seconds

## 2014-01-26 LAB — PROTIME-INR
INR: 1.43 (ref 0.00–1.49)
Prothrombin Time: 17.6 seconds — ABNORMAL HIGH (ref 11.6–15.2)

## 2014-01-26 LAB — HEPARIN LEVEL (UNFRACTIONATED): Heparin Unfractionated: 0.69 IU/mL (ref 0.30–0.70)

## 2014-01-26 SURGERY — LEFT HEART CATHETERIZATION WITH CORONARY/GRAFT ANGIOGRAM
Anesthesia: LOCAL

## 2014-01-26 MED ORDER — VERAPAMIL HCL 2.5 MG/ML IV SOLN
INTRAVENOUS | Status: AC
  Filled 2014-01-26: qty 2

## 2014-01-26 MED ORDER — SODIUM CHLORIDE 0.9 % IV SOLN: 250.0000 mL | INTRAVENOUS | Status: DC | PRN

## 2014-01-26 MED ORDER — MORPHINE SULFATE 2 MG/ML IJ SOLN
2.0000 mg | INTRAMUSCULAR | Status: DC | PRN
  Administered 2014-01-26: 17:00:00 2 mg via INTRAVENOUS
  Filled 2014-01-26: qty 1

## 2014-01-26 MED ORDER — NITROGLYCERIN 1 MG/10 ML FOR IR/CATH LAB
INTRA_ARTERIAL | Status: AC
  Filled 2014-01-26: qty 10

## 2014-01-26 MED ORDER — HEPARIN (PORCINE) IN NACL 2-0.9 UNIT/ML-% IJ SOLN
INTRAMUSCULAR | Status: AC
  Filled 2014-01-26: qty 1500

## 2014-01-26 MED ORDER — FENTANYL CITRATE 0.05 MG/ML IJ SOLN
INTRAMUSCULAR | Status: AC
  Filled 2014-01-26: qty 2

## 2014-01-26 MED ORDER — LIDOCAINE HCL (PF) 1 % IJ SOLN
INTRAMUSCULAR | Status: AC
  Filled 2014-01-26: qty 30

## 2014-01-26 MED ORDER — TICAGRELOR 90 MG PO TABS
ORAL_TABLET | ORAL | Status: AC
  Administered 2014-01-26: 90 mg via ORAL
  Filled 2014-01-26: qty 2

## 2014-01-26 MED ORDER — SODIUM CHLORIDE 0.9 % IV SOLN
1.0000 mL/kg/h | INTRAVENOUS | Status: AC
  Administered 2014-01-26: 1 mL/kg/h via INTRAVENOUS

## 2014-01-26 MED ORDER — SODIUM CHLORIDE 0.9 % IJ SOLN: 3.0000 mL | INTRAMUSCULAR | Status: DC | PRN

## 2014-01-26 MED ORDER — HEPARIN SODIUM (PORCINE) 1000 UNIT/ML IJ SOLN
INTRAMUSCULAR | Status: AC
  Filled 2014-01-26: qty 1

## 2014-01-26 MED ORDER — BIVALIRUDIN 250 MG IV SOLR
INTRAVENOUS | Status: AC
  Filled 2014-01-26: qty 250

## 2014-01-26 MED ORDER — TICAGRELOR 90 MG PO TABS
90.0000 mg | ORAL_TABLET | Freq: Two times a day (BID) | ORAL | Status: DC
  Administered 2014-01-26 – 2014-01-27 (×2): 90 mg via ORAL
  Filled 2014-01-26 (×4): qty 1

## 2014-01-26 MED ORDER — HEART ATTACK BOUNCING BOOK
Freq: Once | Status: AC
  Administered 2014-01-26: 21:00:00
  Filled 2014-01-26: qty 1

## 2014-01-26 MED ORDER — LIVING WELL WITH DIABETES BOOK
Freq: Once | Status: AC
  Administered 2014-01-26: 21:00:00
  Filled 2014-01-26: qty 1

## 2014-01-26 MED ORDER — MIDAZOLAM HCL 2 MG/2ML IJ SOLN
INTRAMUSCULAR | Status: AC
  Filled 2014-01-26: qty 2

## 2014-01-26 MED ORDER — SODIUM CHLORIDE 0.9 % IJ SOLN: 3.0000 mL | Freq: Two times a day (BID) | INTRAMUSCULAR | Status: DC

## 2014-01-26 NOTE — Progress Notes (Signed)
Subjective:  No CP/SOb  Objective:  Temp:  [97.6 F (36.4 C)-98.8 F (37.1 C)] 98.4 F (36.9 C) (10/26 0500) Pulse Rate:  [68-79] 73 (10/26 0839) Resp:  [18] 18 (10/26 0500) BP: (119-137)/(58-74) 122/69 mmHg (10/26 0839) SpO2:  [97 %-99 %] 97 % (10/26 0911) Weight:  [152 lb 3.2 oz (69.037 kg)] 152 lb 3.2 oz (69.037 kg) (10/26 0500) Weight change: 6.4 oz (0.181 kg)  Intake/Output from previous day: 10/25 0701 - 10/26 0700 In: 1039.2 [P.O.:720; I.V.:319.2] Out: -   Intake/Output from this shift:    Physical Exam: General appearance: alert and no distress Neck: no adenopathy, no carotid bruit, no JVD, supple, symmetrical, trachea midline and thyroid not enlarged, symmetric, no tenderness/mass/nodules Lungs: clear to auscultation bilaterally Heart: regular rate and rhythm, S1, S2 normal, no murmur, click, rub or gallop Extremities: extremities normal, atraumatic, no cyanosis or edema  Lab Results: Results for orders placed during the hospital encounter of 01/23/14 (from the past 48 hour(s))  GLUCOSE, CAPILLARY     Status: Abnormal   Collection Time    01/24/14  3:58 PM      Result Value Ref Range   Glucose-Capillary 106 (*) 70 - 99 mg/dL   Comment 1 Notify RN    GLUCOSE, CAPILLARY     Status: Abnormal   Collection Time    01/24/14  7:34 PM      Result Value Ref Range   Glucose-Capillary 143 (*) 70 - 99 mg/dL  CBC     Status: Abnormal   Collection Time    01/25/14  4:57 AM      Result Value Ref Range   WBC 5.1  4.0 - 10.5 K/uL   RBC 3.87 (*) 4.22 - 5.81 MIL/uL   Hemoglobin 10.3 (*) 13.0 - 17.0 g/dL   HCT 32.8 (*) 39.0 - 52.0 %   MCV 84.8  78.0 - 100.0 fL   MCH 26.6  26.0 - 34.0 pg   MCHC 31.4  30.0 - 36.0 g/dL   RDW 15.4  11.5 - 15.5 %   Platelets 306  150 - 400 K/uL  PROTIME-INR     Status: Abnormal   Collection Time    01/25/14  4:57 AM      Result Value Ref Range   Prothrombin Time 20.8 (*) 11.6 - 15.2 seconds   INR 1.77 (*) 0.00 - 1.49  GLUCOSE,  CAPILLARY     Status: Abnormal   Collection Time    01/25/14  7:34 AM      Result Value Ref Range   Glucose-Capillary 124 (*) 70 - 99 mg/dL  GLUCOSE, CAPILLARY     Status: Abnormal   Collection Time    01/25/14 11:46 AM      Result Value Ref Range   Glucose-Capillary 156 (*) 70 - 99 mg/dL  HEPARIN LEVEL (UNFRACTIONATED)     Status: None   Collection Time    01/25/14  3:00 PM      Result Value Ref Range   Heparin Unfractionated 0.39  0.30 - 0.70 IU/mL   Comment:            IF HEPARIN RESULTS ARE BELOW     EXPECTED VALUES, AND PATIENT     DOSAGE HAS BEEN CONFIRMED,     SUGGEST FOLLOW UP TESTING     OF ANTITHROMBIN III LEVELS.  GLUCOSE, CAPILLARY     Status: Abnormal   Collection Time    01/25/14  4:46 PM  Result Value Ref Range   Glucose-Capillary 158 (*) 70 - 99 mg/dL  HEPARIN LEVEL (UNFRACTIONATED)     Status: None   Collection Time    01/25/14  8:50 PM      Result Value Ref Range   Heparin Unfractionated 0.63  0.30 - 0.70 IU/mL   Comment:            IF HEPARIN RESULTS ARE BELOW     EXPECTED VALUES, AND PATIENT     DOSAGE HAS BEEN CONFIRMED,     SUGGEST FOLLOW UP TESTING     OF ANTITHROMBIN III LEVELS.  GLUCOSE, CAPILLARY     Status: None   Collection Time    01/25/14  9:15 PM      Result Value Ref Range   Glucose-Capillary 84  70 - 99 mg/dL  CBC     Status: Abnormal   Collection Time    01/26/14  2:58 AM      Result Value Ref Range   WBC 5.7  4.0 - 10.5 K/uL   RBC 3.83 (*) 4.22 - 5.81 MIL/uL   Hemoglobin 10.4 (*) 13.0 - 17.0 g/dL   HCT 32.1 (*) 39.0 - 52.0 %   MCV 83.8  78.0 - 100.0 fL   MCH 27.2  26.0 - 34.0 pg   MCHC 32.4  30.0 - 36.0 g/dL   RDW 15.3  11.5 - 15.5 %   Platelets 316  150 - 400 K/uL  PROTIME-INR     Status: Abnormal   Collection Time    01/26/14  2:58 AM      Result Value Ref Range   Prothrombin Time 17.6 (*) 11.6 - 15.2 seconds   INR 1.43  0.00 - 1.49  HEPARIN LEVEL (UNFRACTIONATED)     Status: None   Collection Time    01/26/14   2:58 AM      Result Value Ref Range   Heparin Unfractionated 0.69  0.30 - 0.70 IU/mL   Comment:            IF HEPARIN RESULTS ARE BELOW     EXPECTED VALUES, AND PATIENT     DOSAGE HAS BEEN CONFIRMED,     SUGGEST FOLLOW UP TESTING     OF ANTITHROMBIN III LEVELS.  GLUCOSE, CAPILLARY     Status: Abnormal   Collection Time    01/26/14  7:31 AM      Result Value Ref Range   Glucose-Capillary 125 (*) 70 - 99 mg/dL    Imaging: Imaging results have been reviewed  Tele: NSR 70s   Assessment/Plan:   1. Active Problems: 2.   Coronary artery disease 3.   Chronic systolic heart failure 4.   NSTEMI (non-ST elevated myocardial infarction) 5.   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   Paroxysmal atrial fibrillation 7.   History of amiodarone therapy 8.   Warfarin anticoagulation 9.   Hypotension 10.   Non-STEMI (non-ST elevated myocardial infarction) 11.   Time Spent Directly with Patient:  20 minutes  Length of Stay:  LOS: 3 days   Pt admitted with NSTEMI. 6 months S/P CABG. Trop approx 1.5. Echo shows WMA with mild decrease in LV fxn. On IV Hep. Was on coumadin for PAF (held). INR 1.4. Scheduled for cath today.  Lorretta Harp 01/26/2014, 11:42 AM

## 2014-01-26 NOTE — Progress Notes (Signed)
Sheath removed at 1710.  Pressure held until 1730.  Hemostasis achieved. Right groin level 0.  A dry sterile pressure dressing is  Applied.  Post sheath removal instructions given to the patient.  Patient voices understanding to teaching. Good return demonstration on site pressure technique. DP pulse 1+

## 2014-01-26 NOTE — H&P (View-Only) (Signed)
Subjective:  No CP/SOb  Objective:  Temp:  [97.6 F (36.4 C)-98.8 F (37.1 C)] 98.4 F (36.9 C) (10/26 0500) Pulse Rate:  [68-79] 73 (10/26 0839) Resp:  [18] 18 (10/26 0500) BP: (119-137)/(58-74) 122/69 mmHg (10/26 0839) SpO2:  [97 %-99 %] 97 % (10/26 0911) Weight:  [152 lb 3.2 oz (69.037 kg)] 152 lb 3.2 oz (69.037 kg) (10/26 0500) Weight change: 6.4 oz (0.181 kg)  Intake/Output from previous day: 10/25 0701 - 10/26 0700 In: 1039.2 [P.O.:720; I.V.:319.2] Out: -   Intake/Output from this shift:    Physical Exam: General appearance: alert and no distress Neck: no adenopathy, no carotid bruit, no JVD, supple, symmetrical, trachea midline and thyroid not enlarged, symmetric, no tenderness/mass/nodules Lungs: clear to auscultation bilaterally Heart: regular rate and rhythm, S1, S2 normal, no murmur, click, rub or gallop Extremities: extremities normal, atraumatic, no cyanosis or edema  Lab Results: Results for orders placed during the hospital encounter of 01/23/14 (from the past 48 hour(s))  GLUCOSE, CAPILLARY     Status: Abnormal   Collection Time    01/24/14  3:58 PM      Result Value Ref Range   Glucose-Capillary 106 (*) 70 - 99 mg/dL   Comment 1 Notify RN    GLUCOSE, CAPILLARY     Status: Abnormal   Collection Time    01/24/14  7:34 PM      Result Value Ref Range   Glucose-Capillary 143 (*) 70 - 99 mg/dL  CBC     Status: Abnormal   Collection Time    01/25/14  4:57 AM      Result Value Ref Range   WBC 5.1  4.0 - 10.5 K/uL   RBC 3.87 (*) 4.22 - 5.81 MIL/uL   Hemoglobin 10.3 (*) 13.0 - 17.0 g/dL   HCT 32.8 (*) 39.0 - 52.0 %   MCV 84.8  78.0 - 100.0 fL   MCH 26.6  26.0 - 34.0 pg   MCHC 31.4  30.0 - 36.0 g/dL   RDW 15.4  11.5 - 15.5 %   Platelets 306  150 - 400 K/uL  PROTIME-INR     Status: Abnormal   Collection Time    01/25/14  4:57 AM      Result Value Ref Range   Prothrombin Time 20.8 (*) 11.6 - 15.2 seconds   INR 1.77 (*) 0.00 - 1.49  GLUCOSE,  CAPILLARY     Status: Abnormal   Collection Time    01/25/14  7:34 AM      Result Value Ref Range   Glucose-Capillary 124 (*) 70 - 99 mg/dL  GLUCOSE, CAPILLARY     Status: Abnormal   Collection Time    01/25/14 11:46 AM      Result Value Ref Range   Glucose-Capillary 156 (*) 70 - 99 mg/dL  HEPARIN LEVEL (UNFRACTIONATED)     Status: None   Collection Time    01/25/14  3:00 PM      Result Value Ref Range   Heparin Unfractionated 0.39  0.30 - 0.70 IU/mL   Comment:            IF HEPARIN RESULTS ARE BELOW     EXPECTED VALUES, AND PATIENT     DOSAGE HAS BEEN CONFIRMED,     SUGGEST FOLLOW UP TESTING     OF ANTITHROMBIN III LEVELS.  GLUCOSE, CAPILLARY     Status: Abnormal   Collection Time    01/25/14  4:46 PM  Result Value Ref Range   Glucose-Capillary 158 (*) 70 - 99 mg/dL  HEPARIN LEVEL (UNFRACTIONATED)     Status: None   Collection Time    01/25/14  8:50 PM      Result Value Ref Range   Heparin Unfractionated 0.63  0.30 - 0.70 IU/mL   Comment:            IF HEPARIN RESULTS ARE BELOW     EXPECTED VALUES, AND PATIENT     DOSAGE HAS BEEN CONFIRMED,     SUGGEST FOLLOW UP TESTING     OF ANTITHROMBIN III LEVELS.  GLUCOSE, CAPILLARY     Status: None   Collection Time    01/25/14  9:15 PM      Result Value Ref Range   Glucose-Capillary 84  70 - 99 mg/dL  CBC     Status: Abnormal   Collection Time    01/26/14  2:58 AM      Result Value Ref Range   WBC 5.7  4.0 - 10.5 K/uL   RBC 3.83 (*) 4.22 - 5.81 MIL/uL   Hemoglobin 10.4 (*) 13.0 - 17.0 g/dL   HCT 32.1 (*) 39.0 - 52.0 %   MCV 83.8  78.0 - 100.0 fL   MCH 27.2  26.0 - 34.0 pg   MCHC 32.4  30.0 - 36.0 g/dL   RDW 15.3  11.5 - 15.5 %   Platelets 316  150 - 400 K/uL  PROTIME-INR     Status: Abnormal   Collection Time    01/26/14  2:58 AM      Result Value Ref Range   Prothrombin Time 17.6 (*) 11.6 - 15.2 seconds   INR 1.43  0.00 - 1.49  HEPARIN LEVEL (UNFRACTIONATED)     Status: None   Collection Time    01/26/14   2:58 AM      Result Value Ref Range   Heparin Unfractionated 0.69  0.30 - 0.70 IU/mL   Comment:            IF HEPARIN RESULTS ARE BELOW     EXPECTED VALUES, AND PATIENT     DOSAGE HAS BEEN CONFIRMED,     SUGGEST FOLLOW UP TESTING     OF ANTITHROMBIN III LEVELS.  GLUCOSE, CAPILLARY     Status: Abnormal   Collection Time    01/26/14  7:31 AM      Result Value Ref Range   Glucose-Capillary 125 (*) 70 - 99 mg/dL    Imaging: Imaging results have been reviewed  Tele: NSR 70s   Assessment/Plan:   1. Active Problems: 2.   Coronary artery disease 3.   Chronic systolic heart failure 4.   NSTEMI (non-ST elevated myocardial infarction) 5.   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   Paroxysmal atrial fibrillation 7.   History of amiodarone therapy 8.   Warfarin anticoagulation 9.   Hypotension 10.   Non-STEMI (non-ST elevated myocardial infarction) 11.   Time Spent Directly with Patient:  20 minutes  Length of Stay:  LOS: 3 days   Pt admitted with NSTEMI. 6 months S/P CABG. Trop approx 1.5. Echo shows WMA with mild decrease in LV fxn. On IV Hep. Was on coumadin for PAF (held). INR 1.4. Scheduled for cath today.  Lorretta Harp 01/26/2014, 11:42 AM

## 2014-01-26 NOTE — Progress Notes (Signed)
Angiomax Completed

## 2014-01-26 NOTE — Progress Notes (Signed)
ANTICOAGULATION CONSULT NOTE - Follow Up Consult  Pharmacy Consult for heparin Indication: atrial fibrillation/NSTEMI  Allergies  Allergen Reactions  . Sulfa Antibiotics Swelling    Patient Measurements: Height: 5\' 7"  (170.2 cm) Weight: 152 lb 3.2 oz (69.037 kg) IBW/kg (Calculated) : 66.1  Vital Signs: Temp: 98.4 F (36.9 C) (10/26 0500) Temp Source: Oral (10/26 0500) BP: 122/69 mmHg (10/26 0839) Pulse Rate: 73 (10/26 0839)  Labs:  Recent Labs  01/23/14 1930  01/24/14 0100  01/24/14 0710  01/25/14 0457 01/25/14 1500 01/25/14 2050 01/26/14 0258  HGB  --   --   --   < > 10.4*  --  10.3*  --   --  10.4*  HCT  --   --   --   --  32.0*  --  32.8*  --   --  32.1*  PLT  --   --   --   --  310  --  306  --   --  316  LABPROT  --   --   --   --  25.0*  --  20.8*  --   --  17.6*  INR  --   --   --   --  2.25*  --  1.77*  --   --  1.43  HEPARINUNFRC  --   < >  --   --   --   < >  --  0.39 0.63 0.69  CREATININE  --   --   --   --  1.08  --   --   --   --   --   TROPONINI 0.97*  --  1.31*  --  1.28*  --   --   --   --   --   < > = values in this interval not displayed.  Estimated Creatinine Clearance: 60.4 ml/min (by C-G formula based on Cr of 1.08).   Medications:  Infusions:  . sodium chloride 10 mL/hr at 01/26/14 0600  . heparin 1,500 Units/hr (01/26/14 0226)    Assessment: 68 yo M admitted 10/23 with NSTEMI started on heparin while Coumadin on hold for cardiac cath.  Plan for cath today.  Heparin is therapeutic on 1500 units/hr.  Warfarin is currently on hold. PTA dose is 6mg  daily.  Goal of Therapy:  Heparin level 0.3-0.7 units/ml Monitor platelets by anticoagulation protocol: Yes   Plan:  Continue heparin gtt at 1500 units/hr. Continue daily heparin level and CBC while on heparin. Follow-up after cath.  [employer], Pharm.D., BCPS Clinical Pharmacist Pager [phone] 01/26/2014 11:20 AM

## 2014-01-26 NOTE — CV Procedure (Signed)
CARDIAC CATHETERIZATION AND PERCUTANEOUS CORONARY INTERVENTION REPORT  NAME:  CHOICE KLEINSASSER   MRN: [id number] DOB:  December 22, 1945   ADMIT DATE: 01/23/2014 Procedure Date: 01/26/2014  INTERVENTIONAL CARDIOLOGIST: [name], M.D., MS PRIMARY CARE PROVIDER: Saralyn Pilar PRIMARY CARDIOLOGIST: Claris Pong, M.D.  PATIENT:  Gerald Rosario is a 68 y.o. male with a long-standing history of severe multivessel CAD and distal STEMI with improved EF to roughly 55-60% by medical therapy after initially being 20-25%. He has known occlusion of the LAD as well as circumflex and RCA. He was referred for cardiac catheterization in May that revealed progression of disease. This is after a near-syncopal episode. He had rib fractures and hemothorax. He was then referred for CABG 3 with LIMA-LAD, SVG-RI, SVG-D1. He was doing well following his bypass surgery with exception of postop A. fib. He was discharged on warfarin. He was a cardiac rehabilitation on 01/23/2014 and noted significantly decreased energy levels and dyspnea. He had a wider QRS complex with a nonspecific intervertebral conduction delay and was found to be hypotensive. He did not have any anginal symptoms however. He was admitted and evaluated and found to have positive troponin levels suggestive of an isolation MI. He is now referred for invasive evaluation.  PRE-OPERATIVE DIAGNOSIS:    Non-ST elevation MI  Known native CAD with recent CABG  PROCEDURES PERFORMED:    Left Heart Catheterization with Native Coronary and Graft (SVG and LIMA) Angiography  via Right Common Femoral Artery   Left Ventriculography  Percutaneous Coronary Intervention Of the Proximal Ramus Intermedius 80-90% stenosis with a single Xience DES 2.5 mm x 18 mm (2.7 and 2.8 mm distal proximal).  PROCEDURE: The patient was brought to the [facility] in the fasting state and prepped and draped in the usual sterile fashion for  Right Common Femoral artery or Left Radial artery access . A modified Allen's test was performed on the left wrist demonstrating excellent collateral flow for radial access.   Sterile technique was used including antiseptics, cap, gloves, gown, hand hygiene, mask and sheet. Skin prep: Chlorhexidine.   Consent: Risks of procedure as well as the alternatives and risks of each were explained to the (patient/caregiver). Consent for procedure obtained.   Time Out: Verified patient identification, verified procedure, site/side was marked, verified correct patient position, special equipment/implants available, medications/allergies/relevent history reviewed, required imaging and test results available. Performed.  Access:   After several attempts were made to access the Left Radial Artery using the Seldinger technique. The artery was not able to be accessed. Therefore the case was changed to femoral access.  Right Common Femoral Artery: 5 Fr Sheath -  fluoroscopically guided modified Seldinger Technique  Left Heart Catheterization: 5 Fr Catheters advanced or exchanged over a standard J-wire; JL4 catheter advanced first.  Left Coronary Artery Cineangiography: JL4 Catheter  Right Coronary Artery, SVG-RI & SVG-D1 Cineangiography: JR4 Catheter  LIMA-LAD Cineangiography: JR4 Catheter redirected into Left Subclavian Artery & exchanged over long-exchange wire for IMA catheter.  LV Hemodynamics (LV Gram): Angled pigtail  Sheath removed in the postprocedure unit with manual pressure for hemostasis.   FINDINGS:  Hemodynamics:   Central Aortic Pressure / Mean: 136/64/98 mmHg  Left Ventricular Pressure / LVEDP: 139/6/13 mmHg  Left Ventriculography:  EF: 45-50 %  Wall Motion: Inferolateral and anterolateral hypokinesis as well as basal inferior hypokinesis.  Coronary Anatomy:  Dominance: Right  Left Main: Normal caliber vessel that trifurcates into the Ramus Intermedius, LAD, and  Circumflex. There  is mild mid vessel eccentric roughly 10% stenosis. LAD: Moderate caliber vessel with mild proximal disease. After a large septal perforator trunk and moderate caliber diagonal branch, the vessel is 100% occluded.  LIMA-LAD: Widely patent graft to the mid LAD. The disabilities relatively small in diameter, however no significant disease is noted. Antegrade flow is minimal but does go up to the left main. There are left to right collaterals to the RPDA.  D1: Small caliber vessel with severe 80-90% proximal stenosis.  SVG-D1: Widely patent graft with hazy flow in a valve area. The vessel goes to a very small caliber diagonal branch that has diffuse mild disease. There is no retrograde flow to the native LAD. Left Circumflex: Begin to moderate caliber vessel that is 100% occluded in the mid AV groove after neutral branch. There is faint ghosting by collaterals from the ramus intermedius.  Ramus Intermedius (RI): Moderate to large caliber vessel that bifurcates relatively proximally the 2 major branches. The proximal segment has now an extensive 80-90 % stenosis prior to bifurcation and just after the ostium. The 2 major branches are free of disease.  SVG-RI: 100% proximal occlusion   RCA: Likely previously a moderate caliber vessel with now 90% proximal stenosis followed by a string-like vessel that is 100% occluded in the mid vessel. The PDA and posterior lateral branches are filled from both left and right to right collaterals.    After reviewing the initial angiography, the culprit lesion was thought to be the 100% occlusion of the SVG-RI. This vessel is likely not able to be revascularized, therefore, preparation were made to proceed with PCI on the native Ramus Intermedius lesion.  Percutaneous Coronary Intervention:  Sheath exchanged for 6 Fr Guide: 6 Fr   XB LAD 3.5 Guidewire: BMW Lesion Data: Proximal RI 80-90% eccentric/irregular stenosis --> reduced to 0%  TIMI 2 flow pre-PCI, TIMI 3  flow post  Predilation Balloon: AngioSculpt PTCA (Scoring Balloon) 2.0 mm x 15 mm;   8 Atm x 32 Sec, Stent: Xience Alpine DES 2.5 mm x 18 mm; placed just beyond the ostium of the RI  16 Atm x 30 Sec --> distal diameter 2.7 mm Post-dilation Balloon: [place] 2.75 mm x 15 mm;   18 Atm x 30 Sec,  Final Diameter: ~2.8 mm proximal  Post deployment angiography in multiple views, with and without guidewire in place revealed excellent stent deployment and lesion coverage.  There was no evidence of dissection or perforation.  MEDICATIONS:  Anesthesia:  Local Lidocaine 16 ml  Sedation:  2 mg IV Versed, 50 mcg IV fentanyl ;   Omnipaque Contrast: 170 ml  Anticoagulation: Angiomax Bolus & drip  Anti-Platelet Agent:  Brilinta 180 mg  PATIENT DISPOSITION:    The patient was transferred to the PACU holding area in a hemodynamicaly stable, chest pain free condition.  The patient tolerated the procedure well, and there were no complications.  EBL:   < 15 ml  The patient was stable before, during, and after the procedure.  POST-OPERATIVE DIAGNOSIS:    Severe Native CAD with 100% RCA & LAD, ~80-90% proximal RI, mid Circumflex ~99% subtotal occluded with 2 of 3 grafts patent.  Culprit lesion is likely the 100% occlusion of SVG-RI - Xience DES 2.5 mm x 18 mm (2.8 mm prox, 2.7 mm distal)   Mildly reduced to low normal LVEF of roughly 45-50%. There is basal inferior, inferolateral as well as anterolateral hypokinesis.  Normal LVEDP   PLAN OF CARE:  Transferred to [facility] for post PCI care.  He will require antiplatelet therapy for minimum 1 year -- he is also on warfarin for atrial fibrillation. My recommendation would be to do aspirin plus Brilinta for 1 month. After 1 month stop aspirin and restart warfarin. He has not had any additional agent fibrillation since his CABG.  Care Management consult for Brilinta  Anticipate discharge tomorrow morning.    [name], M.D.,  M.S. Interventional Cardiologist   Pager # [phone]

## 2014-01-26 NOTE — Interval H&P Note (Signed)
History and Physical Interval Note:  01/26/2014 12:34 PM  Gerald Rosario  has presented today for surgery, with the diagnosis of NSTEMI.   The various methods of treatment have been discussed with the patient and family. After consideration of risks, benefits and other options for treatment, the patient has consented to  Procedure(s): LEFT HEART CATHETERIZATION WITH CORONARY/GRAFT ANGIOGRAM (N/A) as a surgical intervention .  The patient's history has been reviewed, patient examined, no change in status, stable for surgery.  I have reviewed the patient's chart and labs.  Questions were answered to the patient's satisfaction.    Risks / Complications include, but not limited to: Death, MI, CVA/TIA, VF/VT (with defibrillation), Bradycardia (need for temporary pacer placement), contrast induced nephropathy, bleeding / bruising / hematoma / pseudoaneurysm, vascular or coronary injury (with possible emergent CT or Vascular Surgery), adverse medication reactions, infection.    The patient voices understanding and agree to proceed.    Cath Lab Visit (complete for each Cath Lab visit)  Clinical Evaluation Leading to the Procedure:   ACS: Yes.    Non-ACS:    Anginal Classification: CCS III  Anti-ischemic medical therapy: Minimal Therapy (1 class of medications)  Non-Invasive Test Results: No non-invasive testing performed  Prior CABG: Previous CABG  Gerald Rosario W

## 2014-01-26 NOTE — Care Management Note (Addendum)
    Page 1 of 2   01/28/2014     1:22:29 PM CARE MANAGEMENT NOTE 01/28/2014  Patient:  Gerald Rosario, Gerald Rosario   Account Number:  [id number]  Date Initiated:  01/26/2014  Documentation initiated by:  GRAVES-BIGELOW,BRENDA  Subjective/Objective Assessment:   Pt admitted for cp and increased troponin. Plan for cardiac cath today.     Action/Plan:   CM will continue to monitor for disposition needs.   Anticipated DC Date:  01/28/2014   Anticipated DC Plan:  [place]  CM consult  Medication Assistance      Choice offered to / List presented to:             Status of service:  Completed, signed off Medicare Important Message given?  YES (If response is "NO", the following Medicare IM given date fields will be blank) Date Medicare IM given:  01/26/2014 Medicare IM given by:  GRAVES-BIGELOW,BRENDA Date Additional Medicare IM given:   Additional Medicare IM given by:    Discharge Disposition:  HOME/SELF CARE  Per UR Regulation:  Reviewed for med. necessity/level of care/duration of stay  If discussed at [place] of Stay Meetings, dates discussed:    Comments:  Abhay Godbolt RN, BSN, MSHL, CCM  Nurse - Case Manager,  (Unit [phone]  01/28/2014 CM attempted 2nd cal attempt to patient at [phone].  No answer and unable to leave VMM.  Mercedez Boule RN, BSN, MSHL, CCM  Nurse - Case Manager,  (Unit [phone]  01/27/2014 PER [place] update:  ---01/27/2014 1148 by Memory Argue--- 1. [employer]:  I HAVE CHECK WITH [employer] .GOV NO DRUG COVERAGE 2.[employer] [place] [employer] NO DRUG COVERAGE [employer] SUPPLEMNET PLAN ** PLEASE ASK PATIENT DO  HE HAVE RX COVERAGE *** CM provided patient with 30 day free Brilinta card prior to d/c. CM attempted to make SW referral for transportation home as patient elects to drive self home d/t having no source to asssit with this need today.  Patient refuses stating he then would have the same issue  getting back to the hospital to pick his car up.  CM advised driving self home is against medical advice.  Patient left hospital before above medication update received and unable to identify any additional health plan/medication coverage. CM left VMM on patients phone requesting call back from patient to verify medication coverage and / or possible medication assistance instructions.  Graydon Fofana RN, BSN, MSHL, CCM  Nurse - Case Manager,  (Unit [phone]  01/26/2014 LEFT HEART CATHETERIZATION WITH CORONARY/GRAFT ANGIOGRAM 01/26/2014 Med Review :  ticagrelor (BRILINTA) 90 MG tablet - BENEFITS PENDING Dispo Plan:  Home / Self care.

## 2014-01-26 NOTE — Progress Notes (Signed)
UR completed Sherina Stammer K. Azavion Bouillon, RN, BSN, [place], CCM  01/26/2014 2:55 PM

## 2014-01-27 ENCOUNTER — Encounter (HOSPITAL_COMMUNITY): Payer: Self-pay | Admitting: Physician Assistant

## 2014-01-27 ENCOUNTER — Encounter: Payer: Medicare Other | Admitting: Internal Medicine

## 2014-01-27 DIAGNOSIS — I5022 Chronic systolic (congestive) heart failure: Secondary | ICD-10-CM

## 2014-01-27 DIAGNOSIS — I480 Paroxysmal atrial fibrillation: Secondary | ICD-10-CM | POA: Diagnosis present

## 2014-01-27 DIAGNOSIS — I2510 Atherosclerotic heart disease of native coronary artery without angina pectoris: Secondary | ICD-10-CM | POA: Diagnosis present

## 2014-01-27 LAB — BASIC METABOLIC PANEL
Anion gap: 12 (ref 5–15)
BUN: 16 mg/dL (ref 6–23)
CO2: 22 mEq/L (ref 19–32)
Calcium: 9 mg/dL (ref 8.4–10.5)
Chloride: 104 mEq/L (ref 96–112)
Creatinine, Ser: 1.25 mg/dL (ref 0.50–1.35)
GFR calc Af Amer: 66 mL/min — ABNORMAL LOW (ref >90)
GFR calc non Af Amer: 57 mL/min — ABNORMAL LOW (ref >90)
Glucose, Bld: 109 mg/dL — ABNORMAL HIGH (ref 70–99)
Potassium: 4.7 mEq/L (ref 3.7–5.3)
Sodium: 138 mEq/L (ref 137–147)

## 2014-01-27 LAB — PROTIME-INR
INR: 1.12 (ref 0.00–1.49)
Prothrombin Time: 14.6 seconds (ref 11.6–15.2)

## 2014-01-27 LAB — CBC
HCT: 32.1 % — ABNORMAL LOW (ref 39.0–52.0)
Hemoglobin: 10.3 g/dL — ABNORMAL LOW (ref 13.0–17.0)
MCH: 27.2 pg (ref 26.0–34.0)
MCHC: 32.1 g/dL (ref 30.0–36.0)
MCV: 84.7 fL (ref 78.0–100.0)
Platelets: 306 10*3/uL (ref 150–400)
RBC: 3.79 MIL/uL — ABNORMAL LOW (ref 4.22–5.81)
RDW: 15.3 % (ref 11.5–15.5)
WBC: 5.9 10*3/uL (ref 4.0–10.5)

## 2014-01-27 LAB — GLUCOSE, CAPILLARY: Glucose-Capillary: 107 mg/dL — ABNORMAL HIGH (ref 70–99)

## 2014-01-27 MED ORDER — TICAGRELOR 90 MG PO TABS: 90.0000 mg | ORAL_TABLET | Freq: Two times a day (BID) | ORAL | Status: DC

## 2014-01-27 MED ORDER — NITROGLYCERIN 0.4 MG SL SUBL: 0.4000 mg | SUBLINGUAL_TABLET | SUBLINGUAL | Status: DC | PRN

## 2014-01-27 MED ORDER — CARVEDILOL 6.25 MG PO TABS: 3.1250 mg | ORAL_TABLET | Freq: Two times a day (BID) | ORAL | Status: DC

## 2014-01-27 MED FILL — Sodium Chloride IV Soln 0.9%: INTRAVENOUS | Qty: 50 | Status: AC

## 2014-01-27 NOTE — Discharge Summary (Signed)
Discharge Summary   Patient ID: Gerald Rosario MRN: [id number], DOB/AGE: [birth year] 68 y.o. Admit date: 01/23/2014 D/C date:     01/27/2014  Primary Cardiologist: Dr. [name]  Principal Problem:   NSTEMI (non-ST elevated myocardial infarction) Active Problems:   Coronary artery disease   Diabetes mellitus   Hypercholesterolemia   Chronic systolic heart failure   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 and clipping of LA appendage   Paroxysmal atrial fibrillation   Warfarin anticoagulation   Hypotension   Non-STEMI (non-ST elevated myocardial infarction)   CAD (coronary artery disease)   PAF (paroxysmal atrial fibrillation)    Admission Dates: 01/23/14-01/27/14 Discharge Diagnosis: NSTEMI s/p DES to his SVG-RI  HPI: Gerald Rosario is a 68 y.o. male with a history of HTN, HLD, DM, severe multivessel CAD s/p STEMI (2012) with severe ICM (EF 20-25%) then improved to 60-65% (08/2013), known occlusion of the LAD as well as circumflex and RCA, s/p CABG 3 (08/2013), post op atrail fib on Warfarin who was admitted from cardiac rehab on 01/23/14 with NSTEMI. He had weakness while exercising at cardiac rehab with hypotension.   He was at cardiac rehabilitation on the day of admission and felt poorly while working on the bicycle. He said that he had significant decrease in energy. He did not have chest pain. However it is important to note that he has never had chest pain with any of his cardiac events in the past. He had decreased blood pressure with systolic in the range of 75. EKG revealed that his QRS was wider than the past with a nonspecific interventricular conduction delay. He was brought to the emergency room.    Hospital Course  NSTEMI- pk troponin 1.31.  -- Given his symptoms, troponin elevation, reduction in EF, and new wall motion abnormalities on ECHO, coronary/graft angiography was felt to be indicated. His coumadin was held and he was placed on heparin  -- Underwent  LHC on 01/26/14 which revealed  Severe Native CAD with 100% RCA & LAD, ~80-90% proximal RI, mid Circumflex ~99% subtotal occluded with 2 of 3 grafts patent.  Culprit lesion is likely the 100% occlusion of SVG-RI - Xience DES 2.5 mm x 18 mm (2.8 mm prox, 2.7 mm distal)  Mildly reduced to low normal LVEF of roughly 45-50%. There is basal inferior, inferolateral as well as anterolateral hypokinesis.  Normal LVEDP -- Continue statin, BB, and DAPT.  Plan per Dr. Irish Lack is to continue ASA and Brilinta for one month. After a month, would change to Coumadin and Plavix. There may be increased risk of bleeding with Brilinta and Coumadin.  -- Femoral site stable   Ischemic CM/systolic CHF- severe ICM (EF 20-25%) after distal STEMI in 2012, then improved to 60-65% in 08/2013  -- Now mildly reduced at 45-50% by LHC, 2D ECHO on 01/23/14 with mild/moderate hypokinesis of the inferolateral wall, base inferior segment, and base anterolateral segment- new since the echo done just before his surgery in May, 2015.  -- No s/s volume overload  -- Lasix 38m po qd, Coreg 6.229mBID.  -- Consider adding an ACE as an outpatient   PAF- currently in NSR  -- Has a hx of post op PAF in the setting of a hemothorax in 08/2013. He was treated with amiodarone until August and has had no reoccurrences. -- Plan per Dr. VaIrish Lacks to continue ASA and Brilinta for one month. After a month, would change to Coumadin and Plavix. There may  be increased risk of bleeding with Brilinta and Coumadin. -- Has ILR managed in device clinic   DM- Hg A1c 6.3  -- Follow up with PCP   Elevated TSH- 8.5  -- Follow up with PCP   Hyperlipidemia: Well controlled lipids on 10/24 (LDL 77), continue with Lipitor 80 mg    The patient has had an uncomplicated hospital course and is recovering well. The femoral catheter site is stable. He has been seen by Dr. Irish Lack today and deemed ready for discharge home. All follow-up appointments have been  scheduled. A written RX for a 30 day free supply of Brilinta was provided for the patient. Discharge medications are listed below.   Discharge Vitals: Blood pressure 119/63, pulse 68, temperature 97.3 F (36.3 C), temperature source Oral, resp. rate 18, height 5' 7"  (1.702 m), weight 153 lb 3.5 oz (69.5 kg), SpO2 99.00%.  Labs: Lab Results  Component Value Date   WBC 5.9 01/27/2014   HGB 10.3* 01/27/2014   HCT 32.1* 01/27/2014   MCV 84.7 01/27/2014   PLT 306 01/27/2014     Recent Labs Lab 01/24/14 0710 01/27/14 0352  NA 139 138  K 4.3 4.7  CL 104 104  CO2 26 22  BUN 10 16  CREATININE 1.08 1.25  CALCIUM 8.7 9.0  PROT 6.6  --   BILITOT 0.6  --   ALKPHOS 109  --   ALT 17  --   AST 28  --   GLUCOSE 121* 109*    Lab Results  Component Value Date   CHOL 143 01/24/2014   HDL 46 01/24/2014   LDLCALC 77 01/24/2014   TRIG 98 01/24/2014     Diagnostic Studies/Procedures   Dg Chest 2 View  01/23/2014   CLINICAL DATA:  Acute onset hypotension and weakness. Event occurred at cardiac rehab. Initial encounter.  EXAM: CHEST  2 VIEW  COMPARISON:  09/29/2013.  FINDINGS: There is new interstitial pulmonary edema with Kerley B lines at the periphery. Event recorder is present with LEFT atrial clip. Median sternotomy/CABG. No focal consolidation. LEFT basilar scarring associated with CABG. No effusion. Aortic arch atherosclerosis.  IMPRESSION: 1. New interstitial pulmonary edema. 2. Postoperative changes of CABG.        2D ECHO 01/23/14  EF: 50% Study Conclusions - Left ventricle: Technically difficult study. There is mild/moderate hypokinesis of the inferolateral wall, base inferior segment, and base anterolateral segment. These wall abnormalities are new since the echo done just before his surgery in May, 2015. The cavity size was normal. Wall thickness was normal. The estimated ejection fraction was 50%. - Mitral valve: Calcified annulus. - Left atrium: The atrium was  mildly dilated. - Right ventricle: The cavity size was normal. Systolic function was normal.     CARDIAC CATHETERIZATION AND PERCUTANEOUS CORONARY INTERVENTION REPORT  NAME: Gerald Rosario MRN: [id number]  DOB: [birth year] ADMIT DATE: 01/23/2014  Procedure Date: 01/26/2014  INTERVENTIONAL CARDIOLOGIST: [name], M.D., MS  PRIMARY CARE PROVIDER: Saralyn Pilar  PRIMARY CARDIOLOGIST: Claris Pong, M.D.  PATIENT: BRYLEE MCGREAL is a 67 y.o. male with a long-standing history of severe multivessel CAD and distal STEMI with improved EF to roughly 55-60% by medical therapy after initially being 20-25%. He has known occlusion of the LAD as well as circumflex and RCA. He was referred for cardiac catheterization in May that revealed progression of disease. This is after a near-syncopal episode. He had rib fractures and hemothorax. He was then referred for CABG 3 with  LIMA-LAD, SVG-RI, SVG-D1.  He was doing well following his bypass surgery with exception of postop A. fib. He was discharged on warfarin. He was a cardiac rehabilitation on 01/23/2014 and noted significantly decreased energy levels and dyspnea. He had a wider QRS complex with a nonspecific intervertebral conduction delay and was found to be hypotensive. He did not have any anginal symptoms however. He was admitted and evaluated and found to have positive troponin levels suggestive of an isolation MI. He is now referred for invasive evaluation.  PRE-OPERATIVE DIAGNOSIS:  Non-ST elevation MI  Known native CAD with recent CABG PROCEDURES PERFORMED:  Left Heart Catheterization with Native Coronary and Graft (SVG and LIMA) Angiography via Right Common Femoral Artery  Left Ventriculography  Percutaneous Coronary Intervention Of the Proximal Ramus Intermedius 80-90% stenosis with a single Xience DES 2.5 mm x 18 mm (2.7 and 2.8 mm distal proximal). PROCEDURE: The patient was brought to the [facility]  in the fasting state and prepped and draped in the usual sterile fashion for Right Common Femoral artery or Left Radial artery access . A modified Allen's test was performed on the left wrist demonstrating excellent collateral flow for radial access. Sterile technique was used including antiseptics, cap, gloves, gown, hand hygiene, mask and sheet. Skin prep: Chlorhexidine.  Consent: Risks of procedure as well as the alternatives and risks of each were explained to the (patient/caregiver). Consent for procedure obtained.  Time Out: Verified patient identification, verified procedure, site/side was marked, verified correct patient position, special equipment/implants available, medications/allergies/relevent history reviewed, required imaging and test results available. Performed.  Access:  After several attempts were made to access the Left Radial Artery using the Seldinger technique. The artery was not able to be accessed. Therefore the case was changed to femoral access.  Right Common Femoral Artery: 5 Fr Sheath - fluoroscopically guided modified Seldinger Technique Left Heart Catheterization: 5 Fr Catheters advanced or exchanged over a standard J-wire; JL4 catheter advanced first.  Left Coronary Artery Cineangiography: JL4 Catheter  Right Coronary Artery, SVG-RI & SVG-D1 Cineangiography: JR4 Catheter  LIMA-LAD Cineangiography: JR4 Catheter redirected into Left Subclavian Artery & exchanged over long-exchange wire for IMA catheter. LV Hemodynamics (LV Gram): Angled pigtail Sheath removed in the postprocedure unit with manual pressure for hemostasis.  FINDINGS:  Hemodynamics:  Central Aortic Pressure / Mean: 136/64/98 mmHg  Left Ventricular Pressure / LVEDP: 139/6/13 mmHg Left Ventriculography:  EF: 45-50 %  Wall Motion: Inferolateral and anterolateral hypokinesis as well as basal inferior hypokinesis. Coronary Anatomy:  Dominance: Right Left Main: Normal caliber vessel that trifurcates into the  Ramus Intermedius, LAD, and Circumflex. There is mild mid vessel eccentric roughly 10% stenosis. LAD: Moderate caliber vessel with mild proximal disease. After a large septal perforator trunk and moderate caliber diagonal branch, the vessel is 100% occluded.  LIMA-LAD: Widely patent graft to the mid LAD. The disabilities relatively small in diameter, however no significant disease is noted. Antegrade flow is minimal but does go up to the left main. There are left to right collaterals to the RPDA.  D1: Small caliber vessel with severe 80-90% proximal stenosis.  SVG-D1: Widely patent graft with hazy flow in a valve area. The vessel goes to a very small caliber diagonal branch that has diffuse mild disease. There is no retrograde flow to the native LAD. Left Circumflex: Begin to moderate caliber vessel that is 100% occluded in the mid AV groove after neutral branch. There is faint ghosting by collaterals from the  ramus intermedius.  Ramus Intermedius (RI): Moderate to large caliber vessel that bifurcates relatively proximally the 2 major branches. The proximal segment has now an extensive 80-90 % stenosis prior to bifurcation and just after the ostium. The 2 major branches are free of disease.  SVG-RI: 100% proximal occlusion RCA: Likely previously a moderate caliber vessel with now 90% proximal stenosis followed by a string-like vessel that is 100% occluded in the mid vessel. The PDA and posterior lateral branches are filled from both left and right to right collaterals.  After reviewing the initial angiography, the culprit lesion was thought to be the 100% occlusion of the SVG-RI. This vessel is likely not able to be revascularized, therefore, preparation were made to proceed with PCI on the native Ramus Intermedius lesion.  Percutaneous Coronary Intervention: Sheath exchanged for 6 Fr  Guide: 6 Fr XB LAD 3.5 Guidewire: BMW  Lesion Data: Proximal RI 80-90% eccentric/irregular stenosis --> reduced to 0%    TIMI 2 flow pre-PCI, TIMI 3 flow post Predilation Balloon: AngioSculpt PTCA (Scoring Balloon) 2.0 mm x 15 mm;  8 Atm x 32 Sec, Stent: Xience Alpine DES 2.5 mm x 18 mm; placed just beyond the ostium of the RI  16 Atm x 30 Sec --> distal diameter 2.7 mm Post-dilation Balloon: [place] 2.75 mm x 15 mm;  18 Atm x 30 Sec,  Final Diameter: ~2.8 mm proximal Post deployment angiography in multiple views, with and without guidewire in place revealed excellent stent deployment and lesion coverage. There was no evidence of dissection or perforation.  MEDICATIONS:  Anesthesia: Local Lidocaine 16 ml Sedation: 2 mg IV Versed, 50 mcg IV fentanyl ;  Omnipaque Contrast: 170 ml  Anticoagulation: Angiomax Bolus & drip  Anti-Platelet Agent: Brilinta 180 mg PATIENT DISPOSITION:  The patient was transferred to the PACU holding area in a hemodynamicaly stable, chest pain free condition.  The patient tolerated the procedure well, and there were no complications. EBL: < 15 ml  The patient was stable before, during, and after the procedure. POST-OPERATIVE DIAGNOSIS:  Severe Native CAD with 100% RCA & LAD, ~80-90% proximal RI, mid Circumflex ~99% subtotal occluded with 2 of 3 grafts patent.  Culprit lesion is likely the 100% occlusion of SVG-RI - Xience DES 2.5 mm x 18 mm (2.8 mm prox, 2.7 mm distal)  Mildly reduced to low normal LVEF of roughly 45-50%. There is basal inferior, inferolateral as well as anterolateral hypokinesis.  Normal LVEDP PLAN OF CARE:  Transferred to [facility] for post PCI care.  He will require antiplatelet therapy for minimum 1 year -- he is also on warfarin for atrial fibrillation. My recommendation would be to do aspirin plus Brilinta for 1 month. After 1 month stop aspirin and restart warfarin. He has not had any additional agent fibrillation since his CABG.  Care Management consult for Brilinta  Anticipate discharge tomorrow morning    Discharge Medications     Medication List    STOP  taking these medications       warfarin 6 MG tablet  Commonly known as:  COUMADIN      TAKE these medications       aspirin 81 MG tablet  Take 1 tablet (81 mg total) by mouth every morning.     carvedilol 6.25 MG tablet  Commonly known as:  COREG  Take 0.5 tablets (3.125 mg total) by mouth 2 (two) times daily with a meal.     ferrous sulfate 325 (65 FE) MG tablet  Take  1 tablet (325 mg total) by mouth daily with breakfast.     fish oil-omega-3 fatty acids 1000 MG capsule  Take 1 g by mouth 2 (two) times daily.     fluticasone-salmeterol 115-21 MCG/ACT inhaler  Commonly known as:  ADVAIR HFA  Inhale 2 puffs into the lungs 2 (two) times daily.     furosemide 40 MG tablet  Commonly known as:  LASIX  Take 40 mg by mouth.     glipiZIDE 5 MG tablet  Commonly known as:  GLUCOTROL  Take 5 mg by mouth daily before breakfast.     multivitamin tablet  Take 1 tablet by mouth daily.     nitroGLYCERIN 0.4 MG SL tablet  Commonly known as:  NITROSTAT  Place 1 tablet (0.4 mg total) under the tongue every 5 (five) minutes x 3 doses as needed for chest pain.     OSTEO BI-FLEX REGULAR STRENGTH PO  Take 1 capsule by mouth 2 (two) times daily.     pantoprazole 40 MG tablet  Commonly known as:  PROTONIX  Take 40 mg by mouth daily.     pravastatin 80 MG tablet  Commonly known as:  PRAVACHOL  Take 80 mg by mouth daily.     tamsulosin 0.4 MG Caps capsule  Commonly known as:  FLOMAX  Take 1 capsule (0.4 mg total) by mouth daily.     ticagrelor 90 MG Tabs tablet  Commonly known as:  BRILINTA  Take 1 tablet (90 mg total) by mouth 2 (two) times daily.     traMADol 50 MG tablet  Commonly known as:  ULTRAM  Take 50 mg by mouth every 6 (six) hours as needed for pain.     vitamin C 1000 MG tablet  Take 1,000 mg by mouth daily.     vitamin E 400 UNIT capsule  Take 400 Units by mouth daily.        Disposition   The patient will be discharged in stable condition to  home. Discharge Instructions   Amb Referral to Cardiac Rehabilitation    Complete by:  As directed           Follow-up Information   Follow up with Richardson Dopp, PA-C On 02/03/2014. (@2 :10 PM)    Specialty:  Physician Assistant   Contact information:   [address] [address] [phone]         Duration of Discharge Encounter: Greater than 30 minutes including physician and PA time.  SignedAngelena Form R PA-C 01/27/2014, 10:03 AM   I have examined the patient and reviewed assessment and plan and discussed with patient. Agree with above as stated. Plan for ASA and Brilinta for one month. After a month, would change to Coumadin and Plavix. There may be increased risk of bleeding with Brilinta and Coumadin. Continue aggressive RF modification.   D/W Dr. [name].  Restart rehab after he has an office f/u visit.  Jettie Booze MD, [facility]

## 2014-01-27 NOTE — Progress Notes (Signed)
Patient Name: Gerald Rosario Date of Encounter: 01/27/2014     Active Problems:   Coronary artery disease   Chronic systolic heart failure   NSTEMI (non-ST elevated myocardial infarction)   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 and clipping of LA appendage   Paroxysmal atrial fibrillation   History of amiodarone therapy   Warfarin anticoagulation   Hypotension   Non-STEMI (non-ST elevated myocardial infarction)    SUBJECTIVE  Feeling well. Ready to go home.   CURRENT MEDS . aspirin EC  81 mg Oral q morning - 10a  . atorvastatin  80 mg Oral q1800  . carvedilol  6.25 mg Oral BID WC  . ferrous sulfate  325 mg Oral Q breakfast  . furosemide  40 mg Oral Daily  . glipiZIDE  5 mg Oral QAC breakfast  . insulin aspart  0-15 Units Subcutaneous TID WC  . insulin aspart  0-5 Units Subcutaneous QHS  . mometasone-formoterol  2 puff Inhalation BID  . multivitamin with minerals  1 tablet Oral Daily  . omega-3 acid ethyl esters  1 g Oral BID  . pantoprazole  40 mg Oral Daily  . sodium chloride  3 mL Intravenous Q12H  . sodium chloride  3 mL Intravenous Q12H  . tamsulosin  0.4 mg Oral Daily  . ticagrelor  90 mg Oral BID  . vitamin C  1,000 mg Oral Daily  . vitamin E  400 Units Oral Daily    OBJECTIVE  Filed Vitals:   01/26/14 1430 01/26/14 2000 01/27/14 0004 01/27/14 0345  BP: 142/61 128/59 121/53   Pulse: 68 67 69   Temp:  98.1 F (36.7 C) 97.9 F (36.6 C)   TempSrc:  Oral Oral   Resp: 16 18 18    Height: 5\' 7"  (1.702 m)     Weight: 152 lb 1.9 oz (69 kg)  153 lb 3.5 oz (69.5 kg) 153 lb 3.5 oz (69.5 kg)  SpO2: 97% 98% 100%     Intake/Output Summary (Last 24 hours) at 01/27/14 0633 Last data filed at 01/27/14 0020  Gross per 24 hour  Intake  555.1 ml  Output    875 ml  Net -319.9 ml   Filed Weights   01/26/14 1430 01/27/14 0004 01/27/14 0345  Weight: 152 lb 1.9 oz (69 kg) 153 lb 3.5 oz (69.5 kg) 153 lb 3.5 oz (69.5 kg)    PHYSICAL EXAM  General:  Pleasant, NAD. Neuro: Alert and oriented X 3. Moves all extremities spontaneously. Psych: Normal affect. HEENT:  Normal  Neck: Supple without bruits or JVD. Lungs:  Resp regular and unlabored, CTA. Heart: RRR no s3, s4, or murmurs. Abdomen: Soft, non-tender, non-distended, BS + x 4.  Extremities: No clubbing, cyanosis or edema. DP/PT/Radials 2+ and equal bilaterally.  Accessory Clinical Findings  CBC  Recent Labs  01/26/14 0258 01/27/14 0352  WBC 5.7 5.9  HGB 10.4* 10.3*  HCT 32.1* 32.1*  MCV 83.8 84.7  PLT 316 878   Basic Metabolic Panel  Recent Labs  01/24/14 0710 01/27/14 0352  NA 139 138  K 4.3 4.7  CL 104 104  CO2 26 22  GLUCOSE 121* 109*  BUN 10 16  CREATININE 1.08 1.25  CALCIUM 8.7 9.0   Liver Function Tests  Recent Labs  01/24/14 0710  AST 28  ALT 17  ALKPHOS 109  BILITOT 0.6  PROT 6.6  ALBUMIN 3.0*    Cardiac Enzymes  Recent Labs  01/24/14 0710  TROPONINI  1.28*    TELE  NSR  Radiology/Studies  Dg Chest 2 View  01/23/2014   CLINICAL DATA:  Acute onset hypotension and weakness. Event occurred at cardiac rehab. Initial encounter.  EXAM: CHEST  2 VIEW  COMPARISON:  09/29/2013.  FINDINGS: There is new interstitial pulmonary edema with Kerley B lines at the periphery. Event recorder is present with LEFT atrial clip. Median sternotomy/CABG. No focal consolidation. LEFT basilar scarring associated with CABG. No effusion. Aortic arch atherosclerosis.  IMPRESSION: 1. New interstitial pulmonary edema. 2. Postoperative changes of CABG.   Electronically Signed   By: Dereck Ligas M.D.   On: 01/23/2014 10:04    2D ECHO 01/23/14   EF: 50% Study Conclusions - Left ventricle: Technically difficult study. There is mild/moderate hypokinesis of the inferolateral wall, base inferior segment, and base anterolateral segment. These wall abnormalities are new since the echo done just before his surgery in May, 2015. The cavity size was normal. Wall  thickness was normal. The estimated ejection fraction was 50%. - Mitral valve: Calcified annulus. - Left atrium: The atrium was mildly dilated. - Right ventricle: The cavity size was normal. Systolic function was normal.     ASSESSMENT AND PLAN  Gerald Rosario is a 68 y.o. male with a history of HTN, HLD, DM, severe multivessel CAD s/p STEMI (2012) with severe ICM (EF 20-25%) then improved to 60-65% (08/2013), known occlusion of the LAD as well as circumflex and RCA, s/p CABG 3 (08/2013), post op atrail fib on Warfarin who was admitted from cardiac rehab on 01/23/14 with NSTEMI. He had weakness while exercising at cardiac rehab with hypotension.   NSTEMI- pk troponin 1.31.  -- Given his symptoms, troponin elevation, reduction in EF, and new wall motion abnormalities on ECHO, coronary/graft angiography was felt to be indicated. His coumadin was held and he was placed on heparin -- Underwent LHC on 01/26/14 which revealed Severe Native CAD with 100% RCA & LAD, ~80-90% proximal RI, mid Circumflex ~99% subtotal occluded with 2 of 3 grafts patent.  Culprit lesion is likely the 100% occlusion of SVG-RI - Xience DES 2.5 mm x 18 mm (2.8 mm prox, 2.7 mm distal)  Mildly reduced to low normal LVEF of roughly 45-50%. There is basal inferior, inferolateral as well as anterolateral hypokinesis.  Normal LVEDP -- Continue statin, BB, and DAPT with ASA/Brilinta for 1 month. Then discontinue ASA and restart coumadin. -- Femoral site stable  Ischemic CM/systolic CHF- severe ICM (EF 20-25%) after distal STEMI in 2012, then improved to 60-65% in 08/2013 -- Now mildly reduced at 45-50% by LHC, 2D ECHO on 01/23/14 with mild/moderate hypokinesis of the inferolateral wall, base nferior segment, and base anterolateral segment-- new since the echo done just before his surgery in May, 2015. -- No s/s volume overload -- Lasix 40mg  po qd, Coreg 6.25mg  BID. -- Consider adding an ACE as an outpatient   PAF- currently in  NSR -- Has a hx of post op PAF in the setting of a hemothorax in 08/2013. He was treated with amiodarone until August and has had no reoccurrences.  -- Plan per Dr. Ellyn Hack will be to continue DAPT with ASA/Brilinta for 1 month. Then will discontinue ASA and restart coumadin. Dr. Irish Lack suggested discontinuing Brilinta and continuing on Coumadin and Plavix at that time.  -- Has ILR managed in device clinic  DM- Hg A1c 6.3 -- Follow up with PCP  Elevated TSH- 8.5 -- Follow up with PCP  Hyperlipidemia: Well controlled lipids on  10/24 (LDL 77), continue with Lipitor 80 mg   Judy Pimple PA-C  Pager [phone]  I have examined the patient and reviewed assessment and plan and discussed with patient.  Agree with above as stated.  Plan for ASA and Brilinta for one month.  After a month, would change to Coumadin and Plavix.  There may be increased risk of bleeding with Brilinta and Coumadin.  Continue aggressive RF modification.  John Vasconcelos S.

## 2014-01-27 NOTE — Progress Notes (Signed)
Patient offered paid taxi ride home, stated he has his car here and no one is available to drive him home. Informed that if he does not take the taxi and drives himself home it would be against medical advice. Reviewed risk of driving home would include bleeding from groin site which is a possible life threatening situation. Patient verbalizes understanding of situation and continues to insist on driving home, verbalizing that he can not afford a taxi back to get his vehicle and his only ride is out of town. Spoke with Crawford Givens, social worker. We are able to provide transportation to his home, but not to return and get his car. Patient notified of conversation with social worker and he will drive home. Marita Snellen PA notified and stated she had also spoke to the patient regarding risks of driving himself home.

## 2014-01-27 NOTE — Progress Notes (Signed)
CARDIAC REHAB PHASE I   PRE:  Rate/Rhythm: 70 SR  BP:  Supine:   Sitting: 179/63  Standing:    SaO2: 100 RA  MODE:  Ambulation: 1000 ft   POST:  Rate/Rhythm:   BP:  Supine:   Sitting: 126/78  Standing:    SaO2: 100 RA [id number] Pt tolerated ambulation well without c/o of cp or SOB. VS stable Pt to side of bed after walk with call light in reach. Completed MI and stent education with pt. He voices understanding. Pt was in [place]. CRP in [place] and is eager to get restarted in program. I discussed this with Dr Irish Lack on rounds this am and he told pt that he needs first follow up with Dr [name] first and then he could made that decision.  Rodney Langton RN 01/27/2014 8:51 AM

## 2014-01-28 ENCOUNTER — Encounter (HOSPITAL_COMMUNITY)
Admission: RE | Admit: 2014-01-28 | Discharge: 2014-01-28 | Disposition: A | Discharge status: Home / Self Care | Payer: Medicare Other | Source: Ambulatory Visit

## 2014-01-28 NOTE — Progress Notes (Signed)
Pt discharged from cardiac rehab program due to hospitalization for NSTEMI/DES.  Pt would like to reenroll in program once MD order received.  Pt had great participation with the exception of medical absence for retinal detachment. Exercise workload increase somewhat limited by fatigue and dyspnea, although pt exertion level was hard and pt stated he was working hard.   Dr. [name] made aware.

## 2014-01-29 NOTE — ED Provider Notes (Signed)
Medical screening examination/treatment/procedure(s) were performed by non-physician practitioner and as supervising physician I was immediately available for consultation/collaboration.   Dot Lanes, MD 01/29/14 [phone]

## 2014-01-30 ENCOUNTER — Encounter (HOSPITAL_COMMUNITY): Payer: Medicare Other

## 2014-01-30 NOTE — Progress Notes (Signed)
Loop recorder 

## 2014-02-02 ENCOUNTER — Encounter (HOSPITAL_COMMUNITY): Payer: Medicare Other

## 2014-02-03 ENCOUNTER — Encounter: Payer: Self-pay | Admitting: Physician Assistant

## 2014-02-03 ENCOUNTER — Ambulatory Visit (INDEPENDENT_AMBULATORY_CARE_PROVIDER_SITE_OTHER): Payer: Medicare Other | Admitting: Physician Assistant

## 2014-02-03 VITALS — BP 130/80 | HR 70 | Ht 67.0 in | Wt 157.0 lb

## 2014-02-03 DIAGNOSIS — R55 Syncope and collapse: Secondary | ICD-10-CM

## 2014-02-03 DIAGNOSIS — I480 Paroxysmal atrial fibrillation: Secondary | ICD-10-CM

## 2014-02-03 DIAGNOSIS — I10 Essential (primary) hypertension: Secondary | ICD-10-CM

## 2014-02-03 DIAGNOSIS — I255 Ischemic cardiomyopathy: Secondary | ICD-10-CM

## 2014-02-03 DIAGNOSIS — E7800 Pure hypercholesterolemia, unspecified: Secondary | ICD-10-CM

## 2014-02-03 DIAGNOSIS — I2510 Atherosclerotic heart disease of native coronary artery without angina pectoris: Secondary | ICD-10-CM

## 2014-02-03 DIAGNOSIS — E118 Type 2 diabetes mellitus with unspecified complications: Secondary | ICD-10-CM

## 2014-02-03 NOTE — Progress Notes (Signed)
Cardiology Office Note   Date:  02/03/2014   ID:  Gerald Rosario, DOB Jul 14, 1945, MRN [id number]  PCP:  Saralyn Pilar  Cardiologist:  Dr. Kirk Ruths      History of Present Illness: Gerald Rosario is a 68 y.o. male with a hx of CAD, ischemic cardiomyopathy, systolic CHF, HTN, HL, diabetes. Cardiac catheterization in 10/2010 demonstrated an occluded LAD and CFX, 80% Dx and severe diffuse disease in the RCA. Cardiac MRI 09/2011 demonstrated EF 33% and scar involving the septum, anterior wall, apex and posterior lateral wall. Medical therapy was pursued due to lack of viability. Patient's EF improved over time. Echo 03/2012 demonstrated normal LV function and grade 2 diastolic dysfunction. Cardiac MRI 08/2012 demonstrated EF 52%. Previously followed by Dr. Lia Foyer. He established with Dr. [name] 08/2012.  Admitted 08/2013 with a non-STEMI in the setting of syncope resulting in a fall down steps and multiple rib fractures c/b large hemothorax >> L CT placement. He was seen by EP and underwent ILR implantation. Developed atrial fibrillation. He ultimately underwent cardiac catheterization which demonstrated severe 3 vessel CAD; EF normal >>> s/p CABG (LIMA-LAD; SVG-RAMUS; SVG-DIAG), clipping of atrial appendage, evacuation of L hemothorax by Dr. Roxy Manns 08/28/13. He was placed on amiodarone for atrial fibrillation. He was ultimately deemed safe to start anticoagulation and was placed on Coumadin.  FU with Dr. Kirk Ruths in 8/15 and Amiodarone was DC'd.    Admitted 10/23-10/27 with NSTEMI.  LHC demonstrated a patent LIMA-LAD, patent SVG-D1. He had a proximal native RI with an 80-90% stenosis. SVG-RI was occluded. He underwent PCI with a DES placed to the native RI.  Dual antiplatelet therapy was recommended. Recommendation was to continue aspirin and Brilinta for one month then transition to Plavix and Coumadin.  He returns for follow-up.    He is doing well. He denies any further dizziness.  Denies any weakness. He denies chest pain, significant dyspnea, orthopnea, PND or edema. He denies syncope.   Studies: - LHC (01/26/14): EF 45-50%, inferolateral and anterolateral and inferior HK, left main 10%, LAD occluded, mid AV groove circumflex occluded, proximal RI 80-90%, proximal RCA 90% then occluded, LIMA-LAD patent, SVG-D1 patent, SVG-RI occluded >> PCI:  Xience Alpine DES 2.5 mm x 18 mm to the RI - Echo (08/22/13): EF 60-65%, normal wall motion, moderate LAE, trivial effusion.  - Echo (10/15): Mild-moderate hypokinesis of the inferolateral wall, base inferior segment and basal anterolateral segment, EF 50%, MAC, mild LAE, normal RV function - Carotid US (08/19/13): Bilateral ICA 1-39%   Recent Labs/Images:  09/06/2013: Pro B Natriuretic peptide (BNP) 5869.0* 01/23/2014: TSH 8.570* 01/24/2014: ALT 17; LDL (calc) 77 01/27/2014: BUN 16; Creatinine 1.25; Hemoglobin 10.3*; Potassium 4.7; Sodium 138   Dg Chest 2 View  01/23/2014   CLINICAL DATA:  Acute onset hypotension and weakness. Event occurred at cardiac rehab. Initial encounter.  EXAM: CHEST  2 VIEW  COMPARISON:  09/29/2013.  FINDINGS: There is new interstitial pulmonary edema with Kerley B lines at the periphery. Event recorder is present with LEFT atrial clip. Median sternotomy/CABG. No focal consolidation. LEFT basilar scarring associated with CABG. No effusion. Aortic arch atherosclerosis.  IMPRESSION: 1. New interstitial pulmonary edema. 2. Postoperative changes of CABG.   Electronically Signed   By: Dereck Ligas M.D.   On: 01/23/2014 10:04     Wt Readings from Last 3 Encounters:  01/27/14 153 lb 3.5 oz (69.5 kg)  11/20/13 157 lb (71.215 kg)  10/16/13 156 lb 15.5 oz (71.2  kg)     Past Medical History  Diagnosis Date  . Hypertension   . Asthma     start dulera 100 April 12,2011 > better but "knot in throat" so try qvar June 7,2011 > preferred dulera. HFA 90% May 10,2011 > 90% October 17,2011. PFT's June 7,2011 wnl  x minimal nonspecific mid flow reduction while on dulera. Changed to advair intermediate strength October 17,2011 due to ins issue  . Ischemic cardiomyopathy     Repeat Cardiac MRI - EF 52%, distal Septal & apical Akinesis (suggest scar), unable to assess viability due to patient movement  . Hoarseness 12-20-11    onset 11/09. neg w/u 09/2008. saw Dr. Raelene Bott. 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 Syncope   . Enlarged prostate   . Dyspnea   . Pulmonary nodule   . Hyperlipidemia   . CAD (coronary artery disease)     a. severe multivessel CAD s/p STEMI (2012) with severe ICM (EF 20-25%) now improved to 55-60%    b. 08/2013 s/p CABG 3 with LIMA-LAD, SVG-RI, SVG-D1  c. 01/2014 NSTEMI  s/p DES to SVG-RI  . H/O hyperkalemia   . Hearing loss   . Vocal cord dysfunction   . Enlarged prostate   . Diabetes mellitus   . Chronic kidney disease   . PAF (paroxysmal atrial fibrillation)     a. post CABG  . Syncope and collapse   . Multiple fractures of ribs of left side   . Chronic systolic heart failure   . HYPERTENSION   . HLD (hyperlipidemia)   . PULMONARY NODULE, RIGHT MIDDLE LOBE   . 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 and clipping of LA appendage     a. LIMA-LAD, SVG-RI, SVG-D1  . Hx of detached retina repair     a. @ [facility]; Right Eye    Current Outpatient Prescriptions  Medication Sig Dispense Refill  . Ascorbic Acid (VITAMIN C) 1000 MG tablet Take 1,000 mg by mouth daily.    Marland Kitchen aspirin 81 MG tablet Take 1 tablet (81 mg total) by mouth every morning. 30 tablet   . carvedilol (COREG) 6.25 MG tablet Take 0.5 tablets (3.125 mg total) by mouth 2 (two) times daily with a meal. 60 tablet 11  . ferrous sulfate 325 (65 FE) MG tablet Take 1 tablet (325 mg total) by mouth daily with breakfast.  3  . fish oil-omega-3 fatty acids 1000 MG capsule Take 1 g by mouth 2 (two) times daily.     . fluticasone-salmeterol (ADVAIR HFA) 115-21 MCG/ACT inhaler Inhale 2 puffs into the  lungs 2 (two) times daily.     . furosemide (LASIX) 40 MG tablet Take 40 mg by mouth.    Marland Kitchen glipiZIDE (GLUCOTROL) 5 MG tablet Take 5 mg by mouth daily before breakfast.    . Glucosamine-Chondroitin (OSTEO BI-FLEX REGULAR STRENGTH PO) Take 1 capsule by mouth 2 (two) times daily.     . Multiple Vitamin (MULTIVITAMIN) tablet Take 1 tablet by mouth daily.      . nitroGLYCERIN (NITROSTAT) 0.4 MG SL tablet Place 1 tablet (0.4 mg total) under the tongue every 5 (five) minutes x 3 doses as needed for chest pain. 25 tablet 12  . pantoprazole (PROTONIX) 40 MG tablet Take 40 mg by mouth daily.    . pravastatin (PRAVACHOL) 80 MG tablet Take 80 mg by mouth daily.    . tamsulosin (FLOMAX) 0.4 MG CAPS capsule Take 1 capsule (0.4 mg total)  by mouth daily. 30 capsule   . ticagrelor (BRILINTA) 90 MG TABS tablet Take 1 tablet (90 mg total) by mouth 2 (two) times daily. 60 tablet 1  . traMADol (ULTRAM) 50 MG tablet Take 50 mg by mouth every 6 (six) hours as needed for pain.    . vitamin E 400 UNIT capsule Take 400 Units by mouth daily.     No current facility-administered medications for this visit.     Allergies:   Sulfa antibiotics   Social History:  The patient  reports that he quit smoking about 33 years ago. His smoking use included Cigarettes. He has a 36 pack-year smoking history. He has never used smokeless tobacco. He reports that he drinks alcohol. He reports that he does not use illicit drugs.   Family History:  The patient's family history includes Cancer in his mother; Heart attack in his father.   ROS:  Please see the history of present illness.       All other systems reviewed and negative.    PHYSICAL EXAM: VS:  BP 130/80 mmHg  Pulse 70  Ht 5\' 7"  (1.702 m)  Wt 157 lb (71.215 kg)  BMI 24.58 kg/m2 Well nourished, well developed, in no acute distress HEENT: normal Neck: no JVD Cardiac:  normal S1, S2; RRR; no murmur Lungs:  clear to auscultation bilaterally, no wheezing, rhonchi or  rales Abd: soft, nontender, no hepatomegaly Ext: right groin without hematoma or bruit, no edema Skin: warm and dry Neuro:  CNs 2-12 intact, no focal abnormalities noted  EKG:  NSR, HR 70, normal axis, inferolateral T-wave inversions      ASSESSMENT AND PLAN:  1.  Coronary artery disease:  No angina. He is doing well after recent non-STEMI treated with a DES to the native ramus intermediate.    -  Continue aspirin, Brilinta for at least 30 days.    -  Consider transition to Plavix plus Coumadin after 30 days versus remaining on dual antiplatelet therapy.    -  If dual antiplatelet therapy is continued, patient notes that the cost of Brilinta will be prohibitive. Consider continuing on Plavix plus aspirin.    -  He may resume cardiac rehabilitation. 2.  Ischemic cardiomyopathy: EF preserved.  Blood pressure tends to run low.    -  Continue beta blocker. 3.  Essential hypertension:  Controlled. 4.  PAF (paroxysmal atrial fibrillation):  I have reviewed his ILR recordings. He has not had any atrial fibrillation episodes. He sees Dr. [name] later this month. We can decide whether or not he should remain on Coumadin long-term at that time. 5.  Syncope and collapse:  No recurrence. 6.  Hypercholesterolemia: Continue statin. 7.  Type 2 diabetes mellitus with complication:  Follow-up with primary care. 8.  Elevated TSH: I have recommended he follow-up with his primary care physician to further evaluate.   Disposition:   FU with Dr. [name] 11/20 as planned.   Signed, Versie Starks, MHS 02/03/2014 10:14 AM    [facility] [address], [place], [place]  [address] Phone: [phone]; Fax: [phone]

## 2014-02-03 NOTE — Patient Instructions (Signed)
Your physician recommends that you continue on your current medications as directed. Please refer to the Current Medication list given to you today.  Follow up with your pcp for your thyroid  Ok to resume cardiac rehab  Follow up with Dr.Crenshaw as planned

## 2014-02-04 ENCOUNTER — Encounter (HOSPITAL_COMMUNITY): Payer: Medicare Other

## 2014-02-05 ENCOUNTER — Encounter: Payer: Self-pay | Admitting: Internal Medicine

## 2014-02-06 ENCOUNTER — Encounter (HOSPITAL_COMMUNITY): Payer: Medicare Other

## 2014-02-09 ENCOUNTER — Encounter (HOSPITAL_COMMUNITY): Payer: Medicare Other

## 2014-02-11 ENCOUNTER — Encounter (HOSPITAL_COMMUNITY): Payer: Medicare Other

## 2014-02-12 ENCOUNTER — Encounter (HOSPITAL_COMMUNITY)
Admission: RE | Admit: 2014-02-12 | Discharge: 2014-02-12 | Disposition: A | Discharge status: Home / Self Care | Payer: Medicare Other | Source: Ambulatory Visit | Attending: Cardiology | Admitting: Cardiology

## 2014-02-12 DIAGNOSIS — Z951 Presence of aortocoronary bypass graft: Secondary | ICD-10-CM | POA: Insufficient documentation

## 2014-02-12 DIAGNOSIS — I214 Non-ST elevation (NSTEMI) myocardial infarction: Secondary | ICD-10-CM | POA: Insufficient documentation

## 2014-02-12 NOTE — Progress Notes (Signed)
Cardiac Rehab Medication Review by a Pharmacist  Does the patient  feel that his/her medications are working for him/her?  yes  Has the patient been experiencing any side effects to the medications prescribed?  no  Does the patient measure his/her own blood pressure or blood glucose at home?  yes - once daily  Does the patient have any problems obtaining medications due to transportation or finances?   no  Understanding of regimen: excellent Understanding of indications: excellent Potential of compliance: excellent    Pharmacist comments: Pt has excellent understanding of medications. No issues.   Elicia Lamp, PharmD Clinical Pharmacist - Resident Pager [phone] 02/12/2014 8:25 AM

## 2014-02-13 ENCOUNTER — Encounter (HOSPITAL_COMMUNITY): Payer: Medicare Other

## 2014-02-16 ENCOUNTER — Encounter (HOSPITAL_COMMUNITY)
Admission: RE | Admit: 2014-02-16 | Discharge: 2014-02-16 | Disposition: A | Discharge status: Home / Self Care | Payer: Medicare Other | Source: Ambulatory Visit | Attending: Cardiology | Admitting: Cardiology

## 2014-02-16 ENCOUNTER — Encounter (HOSPITAL_COMMUNITY): Payer: Medicare Other

## 2014-02-16 ENCOUNTER — Encounter (HOSPITAL_COMMUNITY): Payer: Self-pay

## 2014-02-16 DIAGNOSIS — I214 Non-ST elevation (NSTEMI) myocardial infarction: Secondary | ICD-10-CM | POA: Diagnosis not present

## 2014-02-16 DIAGNOSIS — Z951 Presence of aortocoronary bypass graft: Secondary | ICD-10-CM | POA: Diagnosis present

## 2014-02-16 LAB — GLUCOSE, CAPILLARY
Glucose-Capillary: 102 mg/dL — ABNORMAL HIGH (ref 70–99)
Glucose-Capillary: 73 mg/dL (ref 70–99)
Glucose-Capillary: 67 mg/dL — ABNORMAL LOW (ref 70–99)
Glucose-Capillary: 78 mg/dL (ref 70–99)

## 2014-02-16 NOTE — Progress Notes (Addendum)
Pt started cardiac rehab today.  Pt tolerated light exercise without difficulty.  VSS, telemetry-sinus rhythm, non specific ST-T wave changes.  Asymmptomatic.   CBG dropped from 102 pre to 73 post.  Pt had eaten [place] sausage for breakfast. Pt instructed to ate carbohydrate such as a piece of fruit or juice prior to exercise.  Pt given banana and lemonade. 15 minute recheck:  67, pt asymptomatic.  Pt given gingerale.  15 minute recheck 78.  Pt given peanut butter crackers.  Pt reports he took extra glipizide dosage yesterday in attempt to cover pizza he ate.  Pt advised to take medications daily as prescribed and not add additional doses to reduce blood sugar.  Understanding verbalized.   PHQ-0.  Pt exhibits positive coping skills, hopeful outlook and supportive family, although he does not relatives that live close by. Pt goals for cardiac rehab are to improve heart healthy living and increase strength and stamina.  Pt oriented to exercise equipment and routine.  Understanding verbalized.
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
## 2014-02-18 ENCOUNTER — Encounter (HOSPITAL_COMMUNITY): Payer: Medicare Other

## 2014-02-18 ENCOUNTER — Encounter (HOSPITAL_COMMUNITY)
Admission: RE | Admit: 2014-02-18 | Discharge: 2014-02-18 | Disposition: A | Discharge status: Home / Self Care | Payer: Medicare Other | Source: Ambulatory Visit | Attending: Cardiology | Admitting: Cardiology

## 2014-02-18 ENCOUNTER — Encounter: Payer: Self-pay | Admitting: Internal Medicine

## 2014-02-18 ENCOUNTER — Ambulatory Visit (INDEPENDENT_AMBULATORY_CARE_PROVIDER_SITE_OTHER): Payer: Medicare Other | Admitting: *Deleted

## 2014-02-18 DIAGNOSIS — R55 Syncope and collapse: Secondary | ICD-10-CM

## 2014-02-18 DIAGNOSIS — I214 Non-ST elevation (NSTEMI) myocardial infarction: Secondary | ICD-10-CM | POA: Diagnosis not present

## 2014-02-18 LAB — GLUCOSE, CAPILLARY
Glucose-Capillary: 181 mg/dL — ABNORMAL HIGH (ref 70–99)
Glucose-Capillary: 135 mg/dL — ABNORMAL HIGH (ref 70–99)

## 2014-02-20 ENCOUNTER — Encounter: Payer: Self-pay | Admitting: Cardiology

## 2014-02-20 ENCOUNTER — Ambulatory Visit (INDEPENDENT_AMBULATORY_CARE_PROVIDER_SITE_OTHER): Payer: Medicare Other | Admitting: Cardiology

## 2014-02-20 ENCOUNTER — Encounter (HOSPITAL_COMMUNITY)
Admission: RE | Admit: 2014-02-20 | Discharge: 2014-02-20 | Disposition: A | Discharge status: Home / Self Care | Payer: Medicare Other | Source: Ambulatory Visit | Attending: Cardiology | Admitting: Cardiology

## 2014-02-20 VITALS — BP 120/50 | HR 80 | Ht 66.0 in | Wt 156.4 lb

## 2014-02-20 DIAGNOSIS — I214 Non-ST elevation (NSTEMI) myocardial infarction: Secondary | ICD-10-CM | POA: Diagnosis not present

## 2014-02-20 DIAGNOSIS — I2583 Coronary atherosclerosis due to lipid rich plaque: Principal | ICD-10-CM

## 2014-02-20 DIAGNOSIS — E7800 Pure hypercholesterolemia, unspecified: Secondary | ICD-10-CM

## 2014-02-20 DIAGNOSIS — I2510 Atherosclerotic heart disease of native coronary artery without angina pectoris: Secondary | ICD-10-CM

## 2014-02-20 DIAGNOSIS — I480 Paroxysmal atrial fibrillation: Secondary | ICD-10-CM

## 2014-02-20 DIAGNOSIS — I10 Essential (primary) hypertension: Secondary | ICD-10-CM

## 2014-02-20 LAB — MDC_IDC_ENUM_SESS_TYPE_REMOTE: Date Time Interrogation Session: 20151101040500

## 2014-02-20 LAB — GLUCOSE, CAPILLARY
Glucose-Capillary: 194 mg/dL — ABNORMAL HIGH (ref 70–99)
Glucose-Capillary: 124 mg/dL — ABNORMAL HIGH (ref 70–99)

## 2014-02-20 MED ORDER — CARVEDILOL 6.25 MG PO TABS: 3.1250 mg | ORAL_TABLET | Freq: Two times a day (BID) | ORAL | Status: DC

## 2014-02-20 MED ORDER — PRAVASTATIN SODIUM 80 MG PO TABS: 80.0000 mg | ORAL_TABLET | Freq: Every day | ORAL | Status: DC

## 2014-02-20 MED ORDER — FUROSEMIDE 40 MG PO TABS: 40.0000 mg | ORAL_TABLET | Freq: Every day | ORAL | Status: DC

## 2014-02-20 MED ORDER — TICAGRELOR 90 MG PO TABS: 90.0000 mg | ORAL_TABLET | Freq: Two times a day (BID) | ORAL | Status: DC

## 2014-02-20 NOTE — Assessment & Plan Note (Signed)
Continue statin. 
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
## 2014-02-20 NOTE — Assessment & Plan Note (Signed)
Blood pressure controlled. Continue present medications. 

## 2014-02-20 NOTE — Assessment & Plan Note (Signed)
No recurrent events. Implantable loop monitor in place.

## 2014-02-20 NOTE — Patient Instructions (Signed)
Your physician wants you to follow-up in: 6 MONTHS WITH DR CRENSHAW You will receive a reminder letter in the mail two months in advance. If you don't receive a letter, please call our office to schedule the follow-up appointment.  

## 2014-02-20 NOTE — Assessment & Plan Note (Signed)
Continue aspirin and statin. Continue brilinta for one year and then DC.

## 2014-02-23 ENCOUNTER — Encounter (HOSPITAL_COMMUNITY)
Admission: RE | Admit: 2014-02-23 | Discharge: 2014-02-23 | Disposition: A | Discharge status: Home / Self Care | Payer: Medicare Other | Source: Ambulatory Visit | Attending: Cardiology | Admitting: Cardiology

## 2014-02-23 DIAGNOSIS — I214 Non-ST elevation (NSTEMI) myocardial infarction: Secondary | ICD-10-CM | POA: Diagnosis not present

## 2014-02-23 LAB — GLUCOSE, CAPILLARY
Glucose-Capillary: 90 mg/dL (ref 70–99)
Glucose-Capillary: 100 mg/dL — ABNORMAL HIGH (ref 70–99)
Glucose-Capillary: 86 mg/dL (ref 70–99)

## 2014-02-24 NOTE — Progress Notes (Signed)
Loop recorder 

## 2014-02-25 ENCOUNTER — Encounter (HOSPITAL_COMMUNITY)
Admission: RE | Admit: 2014-02-25 | Discharge: 2014-02-25 | Disposition: A | Discharge status: Home / Self Care | Payer: Medicare Other | Source: Ambulatory Visit | Attending: Cardiology | Admitting: Cardiology

## 2014-02-25 DIAGNOSIS — I214 Non-ST elevation (NSTEMI) myocardial infarction: Secondary | ICD-10-CM | POA: Diagnosis not present

## 2014-02-25 LAB — GLUCOSE, CAPILLARY: Glucose-Capillary: 128 mg/dL — ABNORMAL HIGH (ref 70–99)

## 2014-02-27 ENCOUNTER — Encounter (HOSPITAL_COMMUNITY): Payer: Medicare Other

## 2014-03-02 ENCOUNTER — Encounter (INDEPENDENT_AMBULATORY_CARE_PROVIDER_SITE_OTHER): Payer: Medicare Other | Admitting: Ophthalmology

## 2014-03-02 ENCOUNTER — Encounter (HOSPITAL_COMMUNITY): Payer: Medicare Other

## 2014-03-02 ENCOUNTER — Encounter (HOSPITAL_COMMUNITY)
Admission: RE | Admit: 2014-03-02 | Discharge: 2014-03-02 | Disposition: A | Discharge status: Home / Self Care | Payer: Medicare Other | Source: Ambulatory Visit | Attending: Cardiology | Admitting: Cardiology

## 2014-03-02 DIAGNOSIS — E11339 Type 2 diabetes mellitus with moderate nonproliferative diabetic retinopathy without macular edema: Secondary | ICD-10-CM

## 2014-03-02 DIAGNOSIS — I10 Essential (primary) hypertension: Secondary | ICD-10-CM

## 2014-03-02 DIAGNOSIS — H35033 Hypertensive retinopathy, bilateral: Secondary | ICD-10-CM

## 2014-03-02 DIAGNOSIS — E11331 Type 2 diabetes mellitus with moderate nonproliferative diabetic retinopathy with macular edema: Secondary | ICD-10-CM

## 2014-03-02 DIAGNOSIS — E11311 Type 2 diabetes mellitus with unspecified diabetic retinopathy with macular edema: Secondary | ICD-10-CM

## 2014-03-02 DIAGNOSIS — H43813 Vitreous degeneration, bilateral: Secondary | ICD-10-CM

## 2014-03-02 DIAGNOSIS — H338 Other retinal detachments: Secondary | ICD-10-CM

## 2014-03-02 DIAGNOSIS — I214 Non-ST elevation (NSTEMI) myocardial infarction: Secondary | ICD-10-CM | POA: Diagnosis not present

## 2014-03-02 LAB — GLUCOSE, CAPILLARY: Glucose-Capillary: 108 mg/dL — ABNORMAL HIGH (ref 70–99)

## 2014-03-04 ENCOUNTER — Encounter (HOSPITAL_COMMUNITY)
Admission: RE | Admit: 2014-03-04 | Discharge: 2014-03-04 | Disposition: A | Discharge status: Home / Self Care | Payer: Medicare Other | Source: Ambulatory Visit | Attending: Cardiology | Admitting: Cardiology

## 2014-03-04 DIAGNOSIS — I214 Non-ST elevation (NSTEMI) myocardial infarction: Secondary | ICD-10-CM | POA: Insufficient documentation

## 2014-03-04 DIAGNOSIS — Z951 Presence of aortocoronary bypass graft: Secondary | ICD-10-CM | POA: Diagnosis present

## 2014-03-04 LAB — GLUCOSE, CAPILLARY: Glucose-Capillary: 112 mg/dL — ABNORMAL HIGH (ref 70–99)

## 2014-03-05 ENCOUNTER — Encounter: Payer: Self-pay | Admitting: Internal Medicine

## 2014-03-06 ENCOUNTER — Telehealth: Payer: Self-pay | Admitting: *Deleted

## 2014-03-06 ENCOUNTER — Encounter (HOSPITAL_COMMUNITY)
Admission: RE | Admit: 2014-03-06 | Discharge: 2014-03-06 | Disposition: A | Discharge status: Home / Self Care | Payer: Medicare Other | Source: Ambulatory Visit | Attending: Cardiology | Admitting: Cardiology

## 2014-03-06 DIAGNOSIS — I214 Non-ST elevation (NSTEMI) myocardial infarction: Secondary | ICD-10-CM | POA: Diagnosis not present

## 2014-03-06 NOTE — Telephone Encounter (Addendum)
Patient acquired a skin tear earlier this week and it is cont to ooze a little today. The patient is taking both brilinta and aspirin.  Rehab is concerned and wanted to make sure there is nothing else to do. They will apply a non-adherent drsg to the area and leave in place until he returns for rehab and if it cont to ooze they will let us know.

## 2014-03-06 NOTE — Progress Notes (Signed)
Pt arrived at cardiac rehab with blood soaked bandage on right forearm from skin tear 2 days ago.   Bandage removed using peroxide.   No active bleeding although some weeping present.  Bruising present around wound, not signs of infection.  New meplex bandage applied.  Pt able to exercise without difficulty.  After exercise, meplex dressing blood stained. Again, dressing removed, cleaned with soap and water.  No active bleeding, weeping continues.  New telfa dressing with tegaderm covering and coban wrapping applied.  Pt given telfa for home use and instructed to change dressing if becomes saturated. Once weeping discontinues leave wound open to air.  Notify MD if perfuse bleeding or weeping is not resolved.  JC, Dr. Jacalyn Lefevre nurse notified with no new orders.  Pt verbalized understanding.

## 2014-03-06 NOTE — Progress Notes (Signed)
I have reviewed home exercise with [name] on 03/04/2014. The patient was advised to walk 2-4 days per week outside of CRP II for 30-45 minutes continuously.  Pt will also complete one additional day of hand weights outside of CRP II.  Progression of exercise prescription was discussed.  Reviewed THR, pulse, RPE, sign and symptoms and when to call 911 or MD.  Pt voiced understanding.   Archie Endo, MS, ACSM RCEP 03/06/2014 12:09 PM

## 2014-03-09 ENCOUNTER — Encounter (HOSPITAL_COMMUNITY)
Admission: RE | Admit: 2014-03-09 | Discharge: 2014-03-09 | Disposition: A | Discharge status: Home / Self Care | Payer: Medicare Other | Source: Ambulatory Visit | Attending: Cardiology | Admitting: Cardiology

## 2014-03-09 ENCOUNTER — Encounter: Payer: Self-pay | Admitting: Internal Medicine

## 2014-03-09 DIAGNOSIS — I214 Non-ST elevation (NSTEMI) myocardial infarction: Secondary | ICD-10-CM | POA: Diagnosis not present

## 2014-03-09 NOTE — Progress Notes (Signed)
Pt right forearm wound assessed at cardiac rehab today.  Pt had not changed or removed the bandage over the weekend.  Bandage dry and intact with presence of old dried blood.   No visible bleeding present on wound bed when bandage removed.   Wound red with bruising surrounding.  Wound cleaned with soap and water.  Fresh telfa and tegaderm dressing applied.  Pt instructed to remove dressing tonight or tomorrow am and leave open to air if no drainage present.  Pt reports he held brilinta x3 doses in attempt to aid in wound healing and to control wound oozing, even though pt had been instructed to NOT hold brilinta.  Pt advised to only hold brilinta if advised by Dr. [name].  Understanding verbalized.  Pt exercised without difficulty.  Dressing CDI upon leaving department.

## 2014-03-11 ENCOUNTER — Encounter (HOSPITAL_COMMUNITY)
Admission: RE | Admit: 2014-03-11 | Discharge: 2014-03-11 | Disposition: A | Discharge status: Home / Self Care | Payer: Medicare Other | Source: Ambulatory Visit | Attending: Cardiology | Admitting: Cardiology

## 2014-03-11 DIAGNOSIS — I214 Non-ST elevation (NSTEMI) myocardial infarction: Secondary | ICD-10-CM | POA: Diagnosis not present

## 2014-03-11 NOTE — Progress Notes (Signed)
Pt right forearm wound assessed.  Pt has open to air with no bandage. No signs of bleeding or drainage.  Wound bed dry with bruising present. Pt advised to continue to keep clean and dry.  PSYCHOSOCIAL ASSESSMENT  Pt psychosocial assessment reveals no barriers to rehab participation.  Pt quality of life is slightly altered by his heart disease and decreased stamina, however overall pt feels hopeful and positive.    Pt exhibits positive coping skills and strong faith base.   Offered emotional support and reassurance.  Will continue to monitor.

## 2014-03-12 ENCOUNTER — Encounter: Payer: Self-pay | Admitting: Internal Medicine

## 2014-03-12 ENCOUNTER — Encounter (HOSPITAL_COMMUNITY): Payer: Self-pay | Admitting: Internal Medicine

## 2014-03-13 ENCOUNTER — Telehealth: Payer: Self-pay | Admitting: Cardiology

## 2014-03-13 ENCOUNTER — Encounter (HOSPITAL_COMMUNITY)
Admission: RE | Admit: 2014-03-13 | Discharge: 2014-03-13 | Disposition: A | Discharge status: Home / Self Care | Payer: Medicare Other | Source: Ambulatory Visit | Attending: Cardiology | Admitting: Cardiology

## 2014-03-13 DIAGNOSIS — I214 Non-ST elevation (NSTEMI) myocardial infarction: Secondary | ICD-10-CM | POA: Diagnosis not present

## 2014-03-13 NOTE — Telephone Encounter (Signed)
Gerald Rosario at Cardiac Rehab reports patient had a new skin tear in same location as last week. This was cleaned, dressed. Bleeding stopped.

## 2014-03-13 NOTE — Telephone Encounter (Signed)
Mechele Claude would like to speak with a nurse in regards to pt
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
## 2014-03-16 ENCOUNTER — Encounter (HOSPITAL_COMMUNITY)
Admission: RE | Admit: 2014-03-16 | Discharge: 2014-03-16 | Disposition: A | Discharge status: Home / Self Care | Payer: Medicare Other | Source: Ambulatory Visit | Attending: Cardiology | Admitting: Cardiology

## 2014-03-16 DIAGNOSIS — I214 Non-ST elevation (NSTEMI) myocardial infarction: Secondary | ICD-10-CM | POA: Diagnosis not present

## 2014-03-16 NOTE — Progress Notes (Signed)
Gerald Rosario 68 y.o. male Nutrition Note Spoke with pt.  Nutrition Plan and Nutrition Survey goals reviewed with pt. Pt is following Step 2 of the Therapeutic Lifestyle Changes diet. Pt is diabetic. Pt's last A1c indicates blood glucose well-controlled. Pt states he checks his fasting CBG's daily and "I may check it another time depending on how I feel/what I eat." Fasting CBG's reportedly usually range from 90-105 mg/dL. This Probation officer went over Diabetes Education test results. Pt watches sodium intake due to CHF. Pt uses Mrs. Dash to season food and chooses no added salt canned vegetables or regular canned vegetables and rinses them. Pt encouraged to consume more fresh/frozen vegetables rather than canned. Pt expressed understanding of the information reviewed. Pt aware of nutrition education classes offered. Lab Results  Component Value Date   HGBA1C 6.3* 01/23/2014    Nutrition Diagnosis ? Food-and nutrition-related knowledge deficit related to lack of exposure to information as related to diagnosis of: ? CVD ? DM ? Overweight related to excessive energy intake as evidenced by a BMI of 25.4  Nutrition RX/ Estimated Daily Nutrition Needs for: wt loss  1200-1650 Kcal, 30-45 gm fat, 7-11 gm sat fat, 1.1-1.6 gm trans-fat, <1500 mg sodium, 150-175 gm CHO   Nutrition Intervention ? Pt's individual nutrition plan reviewed with pt. ? Benefits of adopting Therapeutic Lifestyle Changes discussed when Medficts reviewed. ? Pt to attend the Portion Distortion class ? Pt to attend the  ? Nutrition I class - met; 10/28/13                    ? Nutrition II class - met; 11/11/13        ? Diabetes Blitz class - met; 10/21/13       ? Diabetes Q & A class ? Continue client-centered nutrition education by RD, as part of interdisciplinary care. Goal(s) ? Pt to identify food quantities necessary to achieve: ? wt loss to a goal wt of 142-147 lb (64.7-67.0 kg) at graduation from cardiac rehab.  ? Use pre-meal and  post-meal CBG's and A1c to determine whether adjustments in food/meal planning will be beneficial or if any meds need to be combined with nutrition therapy. Monitor and Evaluate progress toward nutrition goal with team. Nutrition Risk: Change to Moderate Derek Mound, M.Ed, RD, LDN, CDE 03/16/2014 9:00 AM

## 2014-03-18 ENCOUNTER — Encounter (HOSPITAL_COMMUNITY)
Admission: RE | Admit: 2014-03-18 | Discharge: 2014-03-18 | Disposition: A | Discharge status: Home / Self Care | Payer: Medicare Other | Source: Ambulatory Visit | Attending: Cardiology | Admitting: Cardiology

## 2014-03-18 DIAGNOSIS — I214 Non-ST elevation (NSTEMI) myocardial infarction: Secondary | ICD-10-CM | POA: Diagnosis not present

## 2014-03-19 ENCOUNTER — Telehealth: Payer: Self-pay | Admitting: Cardiology

## 2014-03-19 NOTE — Telephone Encounter (Signed)
Pt called in wanting to know if it is ok for him to take Coq 10. He was concerned about it interacting with his blood thinner. Please call  thanks

## 2014-03-19 NOTE — Telephone Encounter (Signed)
Chandler for patient to take CoQ 10

## 2014-03-19 NOTE — Telephone Encounter (Signed)
Routed to Kristin to advise. 

## 2014-03-19 NOTE — Telephone Encounter (Signed)
Patient notified OK to take CoQ10. He voiced understanding

## 2014-03-20 ENCOUNTER — Ambulatory Visit (INDEPENDENT_AMBULATORY_CARE_PROVIDER_SITE_OTHER): Payer: Medicare Other | Admitting: *Deleted

## 2014-03-20 ENCOUNTER — Encounter (HOSPITAL_COMMUNITY)
Admission: RE | Admit: 2014-03-20 | Discharge: 2014-03-20 | Disposition: A | Discharge status: Home / Self Care | Payer: Medicare Other | Source: Ambulatory Visit | Attending: Cardiology | Admitting: Cardiology

## 2014-03-20 DIAGNOSIS — R55 Syncope and collapse: Secondary | ICD-10-CM

## 2014-03-20 DIAGNOSIS — I214 Non-ST elevation (NSTEMI) myocardial infarction: Secondary | ICD-10-CM | POA: Diagnosis not present

## 2014-03-20 LAB — MDC_IDC_ENUM_SESS_TYPE_REMOTE: Date Time Interrogation Session: 20151209050500

## 2014-03-23 ENCOUNTER — Encounter (HOSPITAL_COMMUNITY)
Admission: RE | Admit: 2014-03-23 | Discharge: 2014-03-23 | Disposition: A | Discharge status: Home / Self Care | Payer: Medicare Other | Source: Ambulatory Visit | Attending: Cardiology | Admitting: Cardiology

## 2014-03-23 DIAGNOSIS — I214 Non-ST elevation (NSTEMI) myocardial infarction: Secondary | ICD-10-CM | POA: Diagnosis not present

## 2014-03-24 NOTE — Progress Notes (Signed)
Loop recorder 

## 2014-03-25 ENCOUNTER — Encounter (HOSPITAL_COMMUNITY)
Admission: RE | Admit: 2014-03-25 | Discharge: 2014-03-25 | Disposition: A | Discharge status: Home / Self Care | Payer: Medicare Other | Source: Ambulatory Visit | Attending: Cardiology | Admitting: Cardiology

## 2014-03-25 DIAGNOSIS — I214 Non-ST elevation (NSTEMI) myocardial infarction: Secondary | ICD-10-CM | POA: Diagnosis not present

## 2014-03-27 ENCOUNTER — Encounter (HOSPITAL_COMMUNITY): Payer: Medicare Other

## 2014-03-30 ENCOUNTER — Encounter (HOSPITAL_COMMUNITY)
Admission: RE | Admit: 2014-03-30 | Discharge: 2014-03-30 | Disposition: A | Discharge status: Home / Self Care | Payer: Medicare Other | Source: Ambulatory Visit | Attending: Cardiology | Admitting: Cardiology

## 2014-03-30 DIAGNOSIS — I214 Non-ST elevation (NSTEMI) myocardial infarction: Secondary | ICD-10-CM | POA: Diagnosis not present

## 2014-03-31 ENCOUNTER — Encounter: Payer: Self-pay | Admitting: Internal Medicine

## 2014-04-01 ENCOUNTER — Encounter (HOSPITAL_COMMUNITY): Payer: Self-pay | Admitting: *Deleted

## 2014-04-01 ENCOUNTER — Encounter (HOSPITAL_COMMUNITY)
Admission: RE | Admit: 2014-04-01 | Discharge: 2014-04-01 | Disposition: A | Discharge status: Home / Self Care | Payer: Medicare Other | Source: Ambulatory Visit | Attending: Cardiology | Admitting: Cardiology

## 2014-04-01 DIAGNOSIS — I214 Non-ST elevation (NSTEMI) myocardial infarction: Secondary | ICD-10-CM | POA: Diagnosis not present

## 2014-04-03 ENCOUNTER — Encounter (HOSPITAL_COMMUNITY): Payer: Medicare Other

## 2014-04-06 ENCOUNTER — Encounter (HOSPITAL_COMMUNITY)
Admission: RE | Admit: 2014-04-06 | Discharge: 2014-04-06 | Disposition: A | Discharge status: Home / Self Care | Payer: Medicare Other | Source: Ambulatory Visit | Attending: Cardiology | Admitting: Cardiology

## 2014-04-06 DIAGNOSIS — Z951 Presence of aortocoronary bypass graft: Secondary | ICD-10-CM | POA: Diagnosis not present

## 2014-04-06 DIAGNOSIS — I214 Non-ST elevation (NSTEMI) myocardial infarction: Secondary | ICD-10-CM | POA: Diagnosis not present

## 2014-04-07 ENCOUNTER — Telehealth: Payer: Self-pay | Admitting: Cardiology

## 2014-04-07 NOTE — Telephone Encounter (Signed)
Pt called in stating that his heart rate has been between 86-121. He had some concerns about it and would like to speak to the nurse about it. Please call  thanks

## 2014-04-07 NOTE — Telephone Encounter (Signed)
Would continue present HR limitations for now. Gerald Rosario

## 2014-04-07 NOTE — Telephone Encounter (Signed)
Spoke with pt, Aware of dr crenshaw's recommendations.  °

## 2014-04-07 NOTE — Telephone Encounter (Signed)
Spoke with pt, he is having a heart rate higher than the preset limit and they are making the patient slow down. They patient is doing well in rehab and is wanting to know if dr [name] will raise the range. Will forward for dr [name] review

## 2014-04-08 ENCOUNTER — Encounter (HOSPITAL_COMMUNITY)
Admission: RE | Admit: 2014-04-08 | Discharge: 2014-04-08 | Disposition: A | Discharge status: Home / Self Care | Payer: Medicare Other | Source: Ambulatory Visit | Attending: Cardiology | Admitting: Cardiology

## 2014-04-08 DIAGNOSIS — I214 Non-ST elevation (NSTEMI) myocardial infarction: Secondary | ICD-10-CM | POA: Diagnosis not present

## 2014-04-08 DIAGNOSIS — Z951 Presence of aortocoronary bypass graft: Secondary | ICD-10-CM | POA: Diagnosis not present

## 2014-04-10 ENCOUNTER — Encounter (HOSPITAL_COMMUNITY)
Admission: RE | Admit: 2014-04-10 | Discharge: 2014-04-10 | Disposition: A | Discharge status: Home / Self Care | Payer: Medicare Other | Source: Ambulatory Visit | Attending: Cardiology | Admitting: Cardiology

## 2014-04-10 DIAGNOSIS — I214 Non-ST elevation (NSTEMI) myocardial infarction: Secondary | ICD-10-CM | POA: Diagnosis not present

## 2014-04-10 DIAGNOSIS — Z951 Presence of aortocoronary bypass graft: Secondary | ICD-10-CM | POA: Diagnosis not present

## 2014-04-13 ENCOUNTER — Encounter (HOSPITAL_COMMUNITY)
Admission: RE | Admit: 2014-04-13 | Discharge: 2014-04-13 | Disposition: A | Discharge status: Home / Self Care | Payer: Medicare Other | Source: Ambulatory Visit | Attending: Cardiology | Admitting: Cardiology

## 2014-04-13 DIAGNOSIS — I214 Non-ST elevation (NSTEMI) myocardial infarction: Secondary | ICD-10-CM | POA: Diagnosis not present

## 2014-04-13 DIAGNOSIS — Z951 Presence of aortocoronary bypass graft: Secondary | ICD-10-CM | POA: Diagnosis not present

## 2014-04-14 ENCOUNTER — Encounter (INDEPENDENT_AMBULATORY_CARE_PROVIDER_SITE_OTHER): Payer: Medicare Other | Admitting: Ophthalmology

## 2014-04-14 DIAGNOSIS — E11311 Type 2 diabetes mellitus with unspecified diabetic retinopathy with macular edema: Secondary | ICD-10-CM | POA: Diagnosis not present

## 2014-04-14 DIAGNOSIS — D649 Anemia, unspecified: Secondary | ICD-10-CM | POA: Diagnosis not present

## 2014-04-14 DIAGNOSIS — I10 Essential (primary) hypertension: Secondary | ICD-10-CM | POA: Diagnosis not present

## 2014-04-14 DIAGNOSIS — H43813 Vitreous degeneration, bilateral: Secondary | ICD-10-CM

## 2014-04-14 DIAGNOSIS — E1169 Type 2 diabetes mellitus with other specified complication: Secondary | ICD-10-CM | POA: Diagnosis not present

## 2014-04-14 DIAGNOSIS — N289 Disorder of kidney and ureter, unspecified: Secondary | ICD-10-CM | POA: Diagnosis not present

## 2014-04-14 DIAGNOSIS — E785 Hyperlipidemia, unspecified: Secondary | ICD-10-CM | POA: Diagnosis not present

## 2014-04-14 DIAGNOSIS — E11331 Type 2 diabetes mellitus with moderate nonproliferative diabetic retinopathy with macular edema: Secondary | ICD-10-CM | POA: Diagnosis not present

## 2014-04-14 DIAGNOSIS — H35033 Hypertensive retinopathy, bilateral: Secondary | ICD-10-CM

## 2014-04-15 ENCOUNTER — Encounter (HOSPITAL_COMMUNITY)
Admission: RE | Admit: 2014-04-15 | Discharge: 2014-04-15 | Disposition: A | Discharge status: Home / Self Care | Payer: Medicare Other | Source: Ambulatory Visit | Attending: Cardiology | Admitting: Cardiology

## 2014-04-15 DIAGNOSIS — I214 Non-ST elevation (NSTEMI) myocardial infarction: Secondary | ICD-10-CM | POA: Diagnosis not present

## 2014-04-15 DIAGNOSIS — Z951 Presence of aortocoronary bypass graft: Secondary | ICD-10-CM | POA: Diagnosis not present

## 2014-04-17 ENCOUNTER — Encounter (HOSPITAL_COMMUNITY)
Admission: RE | Admit: 2014-04-17 | Discharge: 2014-04-17 | Disposition: A | Discharge status: Home / Self Care | Payer: Medicare Other | Source: Ambulatory Visit | Attending: Cardiology | Admitting: Cardiology

## 2014-04-17 DIAGNOSIS — Z951 Presence of aortocoronary bypass graft: Secondary | ICD-10-CM | POA: Diagnosis not present

## 2014-04-17 DIAGNOSIS — I214 Non-ST elevation (NSTEMI) myocardial infarction: Secondary | ICD-10-CM | POA: Diagnosis not present

## 2014-04-20 ENCOUNTER — Ambulatory Visit (INDEPENDENT_AMBULATORY_CARE_PROVIDER_SITE_OTHER): Payer: Medicare Other | Admitting: *Deleted

## 2014-04-20 ENCOUNTER — Encounter (HOSPITAL_COMMUNITY): Payer: Medicare Other

## 2014-04-20 DIAGNOSIS — R55 Syncope and collapse: Secondary | ICD-10-CM | POA: Diagnosis not present

## 2014-04-20 LAB — MDC_IDC_ENUM_SESS_TYPE_REMOTE: Date Time Interrogation Session: 20160202050500

## 2014-04-21 ENCOUNTER — Other Ambulatory Visit (INDEPENDENT_AMBULATORY_CARE_PROVIDER_SITE_OTHER): Payer: Medicare Other | Admitting: Ophthalmology

## 2014-04-21 DIAGNOSIS — H26492 Other secondary cataract, left eye: Secondary | ICD-10-CM | POA: Diagnosis not present

## 2014-04-21 NOTE — Progress Notes (Signed)
Loop recorder 

## 2014-04-22 ENCOUNTER — Encounter (HOSPITAL_COMMUNITY)
Admission: RE | Admit: 2014-04-22 | Discharge: 2014-04-22 | Disposition: A | Discharge status: Home / Self Care | Payer: Medicare Other | Source: Ambulatory Visit | Attending: Cardiology | Admitting: Cardiology

## 2014-04-22 ENCOUNTER — Other Ambulatory Visit: Payer: Self-pay

## 2014-04-22 DIAGNOSIS — Z951 Presence of aortocoronary bypass graft: Secondary | ICD-10-CM | POA: Diagnosis not present

## 2014-04-22 DIAGNOSIS — I214 Non-ST elevation (NSTEMI) myocardial infarction: Secondary | ICD-10-CM | POA: Diagnosis not present

## 2014-04-22 MED ORDER — PANTOPRAZOLE SODIUM 40 MG PO TBEC: 40.0000 mg | DELAYED_RELEASE_TABLET | Freq: Every day | ORAL | Status: DC

## 2014-04-24 ENCOUNTER — Encounter (HOSPITAL_COMMUNITY): Payer: Medicare Other

## 2014-04-27 ENCOUNTER — Encounter (HOSPITAL_COMMUNITY)
Admission: RE | Admit: 2014-04-27 | Discharge: 2014-04-27 | Disposition: A | Discharge status: Home / Self Care | Payer: Medicare Other | Source: Ambulatory Visit | Attending: Cardiology | Admitting: Cardiology

## 2014-04-27 DIAGNOSIS — Z951 Presence of aortocoronary bypass graft: Secondary | ICD-10-CM | POA: Diagnosis not present

## 2014-04-27 DIAGNOSIS — I214 Non-ST elevation (NSTEMI) myocardial infarction: Secondary | ICD-10-CM | POA: Diagnosis not present

## 2014-04-29 ENCOUNTER — Encounter (HOSPITAL_COMMUNITY)
Admission: RE | Admit: 2014-04-29 | Discharge: 2014-04-29 | Disposition: A | Discharge status: Home / Self Care | Payer: Medicare Other | Source: Ambulatory Visit | Attending: Cardiology | Admitting: Cardiology

## 2014-04-29 DIAGNOSIS — Z951 Presence of aortocoronary bypass graft: Secondary | ICD-10-CM | POA: Diagnosis not present

## 2014-04-29 DIAGNOSIS — I214 Non-ST elevation (NSTEMI) myocardial infarction: Secondary | ICD-10-CM | POA: Diagnosis not present

## 2014-05-01 ENCOUNTER — Encounter (HOSPITAL_COMMUNITY)
Admission: RE | Admit: 2014-05-01 | Discharge: 2014-05-01 | Disposition: A | Discharge status: Home / Self Care | Payer: Medicare Other | Source: Ambulatory Visit | Attending: Cardiology | Admitting: Cardiology

## 2014-05-01 DIAGNOSIS — I214 Non-ST elevation (NSTEMI) myocardial infarction: Secondary | ICD-10-CM | POA: Diagnosis not present

## 2014-05-01 DIAGNOSIS — Z951 Presence of aortocoronary bypass graft: Secondary | ICD-10-CM | POA: Diagnosis not present

## 2014-05-04 ENCOUNTER — Encounter (HOSPITAL_COMMUNITY)
Admission: RE | Admit: 2014-05-04 | Discharge: 2014-05-04 | Disposition: A | Discharge status: Home / Self Care | Payer: Medicare Other | Source: Ambulatory Visit | Attending: Cardiology | Admitting: Cardiology

## 2014-05-04 ENCOUNTER — Encounter: Payer: Self-pay | Admitting: Internal Medicine

## 2014-05-04 DIAGNOSIS — I214 Non-ST elevation (NSTEMI) myocardial infarction: Secondary | ICD-10-CM | POA: Diagnosis not present

## 2014-05-04 DIAGNOSIS — Z951 Presence of aortocoronary bypass graft: Secondary | ICD-10-CM | POA: Insufficient documentation

## 2014-05-05 ENCOUNTER — Encounter: Payer: Self-pay | Admitting: Internal Medicine

## 2014-05-06 ENCOUNTER — Encounter (HOSPITAL_COMMUNITY)
Admission: RE | Admit: 2014-05-06 | Discharge: 2014-05-06 | Disposition: A | Discharge status: Home / Self Care | Payer: Medicare Other | Source: Ambulatory Visit | Attending: Cardiology | Admitting: Cardiology

## 2014-05-06 DIAGNOSIS — I214 Non-ST elevation (NSTEMI) myocardial infarction: Secondary | ICD-10-CM | POA: Diagnosis not present

## 2014-05-06 DIAGNOSIS — Z951 Presence of aortocoronary bypass graft: Secondary | ICD-10-CM | POA: Diagnosis not present

## 2014-05-08 ENCOUNTER — Encounter (HOSPITAL_COMMUNITY): Payer: Medicare Other

## 2014-05-11 ENCOUNTER — Encounter (HOSPITAL_COMMUNITY)
Admission: RE | Admit: 2014-05-11 | Discharge: 2014-05-11 | Disposition: A | Discharge status: Home / Self Care | Payer: Medicare Other | Source: Ambulatory Visit | Attending: Cardiology | Admitting: Cardiology

## 2014-05-11 DIAGNOSIS — I214 Non-ST elevation (NSTEMI) myocardial infarction: Secondary | ICD-10-CM | POA: Diagnosis not present

## 2014-05-11 DIAGNOSIS — Z951 Presence of aortocoronary bypass graft: Secondary | ICD-10-CM | POA: Diagnosis not present

## 2014-05-13 ENCOUNTER — Encounter (HOSPITAL_COMMUNITY)
Admission: RE | Admit: 2014-05-13 | Discharge: 2014-05-13 | Disposition: A | Discharge status: Home / Self Care | Payer: Medicare Other | Source: Ambulatory Visit | Attending: Cardiology | Admitting: Cardiology

## 2014-05-13 DIAGNOSIS — Z951 Presence of aortocoronary bypass graft: Secondary | ICD-10-CM | POA: Diagnosis not present

## 2014-05-13 DIAGNOSIS — I214 Non-ST elevation (NSTEMI) myocardial infarction: Secondary | ICD-10-CM | POA: Diagnosis not present

## 2014-05-13 NOTE — Progress Notes (Signed)
Pt hypotensive post exercise BP-89/60, pt also exceeding target heart rate during exercise. Pt admits to working hard RPE-15, on stationary bike third station. Pt advised to decrease intensity with exercise avoiding heavy exertion. Pt educated on benefits of moderate exercise versus heavy exertion.  Pt given gatorade and 2 saltine crackers.  Recheck BP-93/56.  Pt asymptomatic, denies dyspnea, dizziness or lightheadedness.

## 2014-05-15 ENCOUNTER — Encounter (HOSPITAL_COMMUNITY)
Admission: RE | Admit: 2014-05-15 | Discharge: 2014-05-15 | Disposition: A | Discharge status: Home / Self Care | Payer: Medicare Other | Source: Ambulatory Visit | Attending: Cardiology | Admitting: Cardiology

## 2014-05-15 DIAGNOSIS — I214 Non-ST elevation (NSTEMI) myocardial infarction: Secondary | ICD-10-CM | POA: Diagnosis not present

## 2014-05-15 DIAGNOSIS — Z951 Presence of aortocoronary bypass graft: Secondary | ICD-10-CM | POA: Diagnosis not present

## 2014-05-18 ENCOUNTER — Encounter (HOSPITAL_COMMUNITY): Payer: Medicare Other

## 2014-05-19 ENCOUNTER — Encounter: Payer: Self-pay | Admitting: Internal Medicine

## 2014-05-19 ENCOUNTER — Ambulatory Visit (INDEPENDENT_AMBULATORY_CARE_PROVIDER_SITE_OTHER): Payer: Medicare Other | Admitting: *Deleted

## 2014-05-19 DIAGNOSIS — R55 Syncope and collapse: Secondary | ICD-10-CM | POA: Diagnosis not present

## 2014-05-19 LAB — MDC_IDC_ENUM_SESS_TYPE_REMOTE: Date Time Interrogation Session: 20160220050500

## 2014-05-20 ENCOUNTER — Encounter: Payer: Self-pay | Admitting: Internal Medicine

## 2014-05-20 ENCOUNTER — Encounter (HOSPITAL_COMMUNITY)
Admission: RE | Admit: 2014-05-20 | Discharge: 2014-05-20 | Disposition: A | Discharge status: Home / Self Care | Payer: Medicare Other | Source: Ambulatory Visit | Attending: Cardiology | Admitting: Cardiology

## 2014-05-20 DIAGNOSIS — I214 Non-ST elevation (NSTEMI) myocardial infarction: Secondary | ICD-10-CM | POA: Diagnosis not present

## 2014-05-20 DIAGNOSIS — Z951 Presence of aortocoronary bypass graft: Secondary | ICD-10-CM | POA: Diagnosis not present

## 2014-05-20 NOTE — Progress Notes (Signed)
Loop recorder 

## 2014-05-22 ENCOUNTER — Encounter (HOSPITAL_COMMUNITY)
Admission: RE | Admit: 2014-05-22 | Discharge: 2014-05-22 | Disposition: A | Discharge status: Home / Self Care | Payer: Medicare Other | Source: Ambulatory Visit | Attending: Cardiology | Admitting: Cardiology

## 2014-05-22 DIAGNOSIS — Z951 Presence of aortocoronary bypass graft: Secondary | ICD-10-CM | POA: Diagnosis not present

## 2014-05-22 DIAGNOSIS — I214 Non-ST elevation (NSTEMI) myocardial infarction: Secondary | ICD-10-CM | POA: Diagnosis not present

## 2014-05-25 ENCOUNTER — Encounter (HOSPITAL_COMMUNITY)
Admission: RE | Admit: 2014-05-25 | Discharge: 2014-05-25 | Disposition: A | Discharge status: Home / Self Care | Payer: Medicare Other | Source: Ambulatory Visit | Attending: Cardiology | Admitting: Cardiology

## 2014-05-25 ENCOUNTER — Encounter (HOSPITAL_COMMUNITY): Payer: Self-pay

## 2014-05-25 DIAGNOSIS — I214 Non-ST elevation (NSTEMI) myocardial infarction: Secondary | ICD-10-CM | POA: Diagnosis not present

## 2014-05-25 DIAGNOSIS — Z951 Presence of aortocoronary bypass graft: Secondary | ICD-10-CM | POA: Diagnosis not present

## 2014-05-25 NOTE — Progress Notes (Signed)
Pt graduated from cardiac rehab program today with completion of 36 exercise sessions in Phase II. Pt maintained good attendance and progressed nicely during his participation in rehab as evidenced by increased MET level.   Medication list reconciled. Repeat  PHQ score-  0.  Pt has made significant lifestyle changes and should be commended for his success. Pt feels he has achieved his goals during cardiac rehab, especially gaining increased exercise tolerance with less dyspnea, increased strength and stamina.  Fortunately  dizziness or exaggerated post exercise hypotension resolved with most recent stent placement.  Pt also able to identify and control when he is exercising too vigorously.  Pt cautioned against exceeding Target heart rate with exercise and to stay within appropriate (<14) RPE range.   Pt verbalized understanding.   Pt plans to continue exercise in cardiac maintenance program and going to [employer].  Pt eventually would like to volunteer in cardiac rehab program.

## 2014-05-26 ENCOUNTER — Encounter (INDEPENDENT_AMBULATORY_CARE_PROVIDER_SITE_OTHER): Payer: Medicare Other | Admitting: Ophthalmology

## 2014-05-26 DIAGNOSIS — H338 Other retinal detachments: Secondary | ICD-10-CM | POA: Diagnosis not present

## 2014-05-26 DIAGNOSIS — H35033 Hypertensive retinopathy, bilateral: Secondary | ICD-10-CM | POA: Diagnosis not present

## 2014-05-26 DIAGNOSIS — H43813 Vitreous degeneration, bilateral: Secondary | ICD-10-CM

## 2014-05-26 DIAGNOSIS — E11311 Type 2 diabetes mellitus with unspecified diabetic retinopathy with macular edema: Secondary | ICD-10-CM | POA: Diagnosis not present

## 2014-05-26 DIAGNOSIS — E11321 Type 2 diabetes mellitus with mild nonproliferative diabetic retinopathy with macular edema: Secondary | ICD-10-CM | POA: Diagnosis not present

## 2014-05-26 DIAGNOSIS — E11331 Type 2 diabetes mellitus with moderate nonproliferative diabetic retinopathy with macular edema: Secondary | ICD-10-CM | POA: Diagnosis not present

## 2014-05-26 DIAGNOSIS — I10 Essential (primary) hypertension: Secondary | ICD-10-CM

## 2014-06-02 ENCOUNTER — Encounter (HOSPITAL_COMMUNITY)
Admission: RE | Admit: 2014-06-02 | Discharge: 2014-06-02 | Disposition: A | Discharge status: Home / Self Care | Payer: Self-pay | Source: Ambulatory Visit | Attending: Cardiology | Admitting: Cardiology

## 2014-06-02 DIAGNOSIS — I252 Old myocardial infarction: Secondary | ICD-10-CM | POA: Insufficient documentation

## 2014-06-02 DIAGNOSIS — Z5189 Encounter for other specified aftercare: Secondary | ICD-10-CM | POA: Insufficient documentation

## 2014-06-02 DIAGNOSIS — Z955 Presence of coronary angioplasty implant and graft: Secondary | ICD-10-CM | POA: Insufficient documentation

## 2014-06-03 ENCOUNTER — Encounter (HOSPITAL_COMMUNITY)
Admission: RE | Admit: 2014-06-03 | Discharge: 2014-06-03 | Disposition: A | Discharge status: Home / Self Care | Payer: Self-pay | Source: Ambulatory Visit | Attending: Cardiology | Admitting: Cardiology

## 2014-06-04 ENCOUNTER — Encounter (HOSPITAL_COMMUNITY): Payer: Self-pay

## 2014-06-08 ENCOUNTER — Encounter: Payer: Self-pay | Admitting: Internal Medicine

## 2014-06-08 ENCOUNTER — Encounter (HOSPITAL_COMMUNITY): Payer: Self-pay

## 2014-06-09 ENCOUNTER — Encounter (HOSPITAL_COMMUNITY)
Admission: RE | Admit: 2014-06-09 | Discharge: 2014-06-09 | Disposition: A | Discharge status: Home / Self Care | Payer: Self-pay | Source: Ambulatory Visit | Attending: Cardiology | Admitting: Cardiology

## 2014-06-11 ENCOUNTER — Encounter (HOSPITAL_COMMUNITY)
Admission: RE | Admit: 2014-06-11 | Discharge: 2014-06-11 | Disposition: A | Discharge status: Home / Self Care | Payer: Self-pay | Source: Ambulatory Visit | Attending: Cardiology | Admitting: Cardiology

## 2014-06-12 ENCOUNTER — Encounter: Payer: Self-pay | Admitting: Internal Medicine

## 2014-06-15 ENCOUNTER — Encounter (HOSPITAL_COMMUNITY): Payer: Self-pay

## 2014-06-16 ENCOUNTER — Encounter (HOSPITAL_COMMUNITY)
Admission: RE | Admit: 2014-06-16 | Discharge: 2014-06-16 | Disposition: A | Discharge status: Home / Self Care | Payer: Self-pay | Source: Ambulatory Visit | Attending: Cardiology | Admitting: Cardiology

## 2014-06-18 ENCOUNTER — Ambulatory Visit (INDEPENDENT_AMBULATORY_CARE_PROVIDER_SITE_OTHER): Payer: Medicare Other | Admitting: *Deleted

## 2014-06-18 ENCOUNTER — Encounter (HOSPITAL_COMMUNITY): Payer: Self-pay

## 2014-06-18 DIAGNOSIS — R55 Syncope and collapse: Secondary | ICD-10-CM | POA: Diagnosis not present

## 2014-06-19 NOTE — Progress Notes (Signed)
Loop recorder 

## 2014-06-22 ENCOUNTER — Encounter (HOSPITAL_COMMUNITY): Payer: Self-pay

## 2014-06-23 ENCOUNTER — Encounter (HOSPITAL_COMMUNITY)
Admission: RE | Admit: 2014-06-23 | Discharge: 2014-06-23 | Disposition: A | Discharge status: Home / Self Care | Payer: Self-pay | Source: Ambulatory Visit | Attending: Cardiology | Admitting: Cardiology

## 2014-06-23 ENCOUNTER — Encounter: Payer: Self-pay | Admitting: Internal Medicine

## 2014-06-25 ENCOUNTER — Encounter (HOSPITAL_COMMUNITY)
Admission: RE | Admit: 2014-06-25 | Discharge: 2014-06-25 | Disposition: A | Discharge status: Home / Self Care | Payer: Self-pay | Source: Ambulatory Visit | Attending: Cardiology | Admitting: Cardiology

## 2014-06-26 ENCOUNTER — Encounter (HOSPITAL_COMMUNITY)
Admission: RE | Admit: 2014-06-26 | Discharge: 2014-06-26 | Disposition: A | Discharge status: Home / Self Care | Payer: Self-pay | Source: Ambulatory Visit | Attending: Cardiology | Admitting: Cardiology

## 2014-06-29 ENCOUNTER — Encounter (HOSPITAL_COMMUNITY): Payer: Self-pay

## 2014-06-30 ENCOUNTER — Encounter (HOSPITAL_COMMUNITY)
Admission: RE | Admit: 2014-06-30 | Discharge: 2014-06-30 | Disposition: A | Discharge status: Home / Self Care | Payer: Self-pay | Source: Ambulatory Visit | Attending: Cardiology | Admitting: Cardiology

## 2014-07-01 ENCOUNTER — Telehealth: Payer: Self-pay | Admitting: Cardiology

## 2014-07-01 LAB — MDC_IDC_ENUM_SESS_TYPE_REMOTE: Date Time Interrogation Session: 20160304050500

## 2014-07-02 ENCOUNTER — Encounter (HOSPITAL_COMMUNITY)
Admission: RE | Admit: 2014-07-02 | Discharge: 2014-07-02 | Disposition: A | Discharge status: Home / Self Care | Payer: Self-pay | Source: Ambulatory Visit | Attending: Cardiology | Admitting: Cardiology

## 2014-07-03 ENCOUNTER — Encounter (HOSPITAL_COMMUNITY)
Admission: RE | Admit: 2014-07-03 | Discharge: 2014-07-03 | Disposition: A | Discharge status: Home / Self Care | Payer: Self-pay | Source: Ambulatory Visit | Attending: Cardiology | Admitting: Cardiology

## 2014-07-03 DIAGNOSIS — Z955 Presence of coronary angioplasty implant and graft: Secondary | ICD-10-CM | POA: Insufficient documentation

## 2014-07-03 DIAGNOSIS — I252 Old myocardial infarction: Secondary | ICD-10-CM | POA: Insufficient documentation

## 2014-07-03 DIAGNOSIS — Z5189 Encounter for other specified aftercare: Secondary | ICD-10-CM | POA: Insufficient documentation

## 2014-07-03 NOTE — Telephone Encounter (Signed)
Closed encounter °

## 2014-07-06 ENCOUNTER — Encounter (HOSPITAL_COMMUNITY): Payer: Self-pay

## 2014-07-07 ENCOUNTER — Encounter (HOSPITAL_COMMUNITY)
Admission: RE | Admit: 2014-07-07 | Discharge: 2014-07-07 | Disposition: A | Discharge status: Home / Self Care | Payer: Self-pay | Source: Ambulatory Visit | Attending: Cardiology | Admitting: Cardiology

## 2014-07-09 ENCOUNTER — Encounter (HOSPITAL_COMMUNITY): Payer: Self-pay

## 2014-07-10 ENCOUNTER — Encounter (HOSPITAL_COMMUNITY)
Admission: RE | Admit: 2014-07-10 | Discharge: 2014-07-10 | Disposition: A | Discharge status: Home / Self Care | Payer: Self-pay | Source: Ambulatory Visit | Attending: Cardiology | Admitting: Cardiology

## 2014-07-13 ENCOUNTER — Encounter (HOSPITAL_COMMUNITY): Payer: Self-pay

## 2014-07-14 ENCOUNTER — Encounter (HOSPITAL_COMMUNITY)
Admission: RE | Admit: 2014-07-14 | Discharge: 2014-07-14 | Disposition: A | Discharge status: Home / Self Care | Payer: Self-pay | Source: Ambulatory Visit | Attending: Cardiology | Admitting: Cardiology

## 2014-07-16 ENCOUNTER — Encounter (HOSPITAL_COMMUNITY): Payer: Self-pay

## 2014-07-16 DIAGNOSIS — N401 Enlarged prostate with lower urinary tract symptoms: Secondary | ICD-10-CM | POA: Diagnosis not present

## 2014-07-16 DIAGNOSIS — R339 Retention of urine, unspecified: Secondary | ICD-10-CM | POA: Diagnosis not present

## 2014-07-16 DIAGNOSIS — N138 Other obstructive and reflux uropathy: Secondary | ICD-10-CM | POA: Diagnosis not present

## 2014-07-17 ENCOUNTER — Encounter (HOSPITAL_COMMUNITY)
Admission: RE | Admit: 2014-07-17 | Discharge: 2014-07-17 | Disposition: A | Discharge status: Home / Self Care | Payer: Self-pay | Source: Ambulatory Visit | Attending: Cardiology | Admitting: Cardiology

## 2014-07-17 ENCOUNTER — Ambulatory Visit (INDEPENDENT_AMBULATORY_CARE_PROVIDER_SITE_OTHER): Payer: Medicare Other | Admitting: *Deleted

## 2014-07-17 DIAGNOSIS — R55 Syncope and collapse: Secondary | ICD-10-CM

## 2014-07-20 ENCOUNTER — Encounter: Payer: Self-pay | Admitting: Internal Medicine

## 2014-07-20 ENCOUNTER — Encounter (HOSPITAL_COMMUNITY): Payer: Self-pay

## 2014-07-21 ENCOUNTER — Encounter (HOSPITAL_COMMUNITY)
Admission: RE | Admit: 2014-07-21 | Discharge: 2014-07-21 | Disposition: A | Discharge status: Home / Self Care | Payer: Self-pay | Source: Ambulatory Visit | Attending: Cardiology | Admitting: Cardiology

## 2014-07-22 ENCOUNTER — Encounter (INDEPENDENT_AMBULATORY_CARE_PROVIDER_SITE_OTHER): Payer: Medicare Other | Admitting: Ophthalmology

## 2014-07-22 DIAGNOSIS — E11311 Type 2 diabetes mellitus with unspecified diabetic retinopathy with macular edema: Secondary | ICD-10-CM | POA: Diagnosis not present

## 2014-07-22 DIAGNOSIS — E11331 Type 2 diabetes mellitus with moderate nonproliferative diabetic retinopathy with macular edema: Secondary | ICD-10-CM | POA: Diagnosis not present

## 2014-07-22 DIAGNOSIS — I10 Essential (primary) hypertension: Secondary | ICD-10-CM

## 2014-07-22 DIAGNOSIS — H35033 Hypertensive retinopathy, bilateral: Secondary | ICD-10-CM | POA: Diagnosis not present

## 2014-07-22 NOTE — Progress Notes (Signed)
Loop recorder 

## 2014-07-23 ENCOUNTER — Encounter (HOSPITAL_COMMUNITY): Payer: Self-pay

## 2014-07-27 ENCOUNTER — Encounter (HOSPITAL_COMMUNITY)
Admission: RE | Admit: 2014-07-27 | Discharge: 2014-07-27 | Disposition: A | Discharge status: Home / Self Care | Payer: Self-pay | Source: Ambulatory Visit | Attending: Cardiology | Admitting: Cardiology

## 2014-07-28 ENCOUNTER — Encounter (HOSPITAL_COMMUNITY)
Admission: RE | Admit: 2014-07-28 | Discharge: 2014-07-28 | Disposition: A | Discharge status: Home / Self Care | Payer: Self-pay | Source: Ambulatory Visit | Attending: Cardiology | Admitting: Cardiology

## 2014-07-30 ENCOUNTER — Encounter (HOSPITAL_COMMUNITY): Payer: Self-pay

## 2014-08-03 ENCOUNTER — Encounter (HOSPITAL_COMMUNITY): Admission: RE | Admit: 2014-08-03 | Payer: Self-pay | Source: Ambulatory Visit

## 2014-08-04 ENCOUNTER — Encounter: Payer: No Typology Code available for payment source | Admitting: Internal Medicine

## 2014-08-04 ENCOUNTER — Encounter (HOSPITAL_COMMUNITY): Payer: Medicare Other

## 2014-08-04 DIAGNOSIS — Z5189 Encounter for other specified aftercare: Secondary | ICD-10-CM | POA: Insufficient documentation

## 2014-08-04 DIAGNOSIS — I252 Old myocardial infarction: Secondary | ICD-10-CM | POA: Insufficient documentation

## 2014-08-04 DIAGNOSIS — Z955 Presence of coronary angioplasty implant and graft: Secondary | ICD-10-CM | POA: Insufficient documentation

## 2014-08-05 ENCOUNTER — Encounter: Payer: Self-pay | Admitting: Internal Medicine

## 2014-08-06 ENCOUNTER — Encounter (HOSPITAL_COMMUNITY): Payer: Self-pay

## 2014-08-10 ENCOUNTER — Encounter (HOSPITAL_COMMUNITY): Payer: Self-pay

## 2014-08-11 ENCOUNTER — Encounter (HOSPITAL_COMMUNITY): Payer: Self-pay

## 2014-08-13 ENCOUNTER — Encounter (HOSPITAL_COMMUNITY): Payer: Self-pay

## 2014-08-13 LAB — CUP PACEART REMOTE DEVICE CHECK: Date Time Interrogation Session: 20160418040500

## 2014-08-17 ENCOUNTER — Ambulatory Visit (INDEPENDENT_AMBULATORY_CARE_PROVIDER_SITE_OTHER): Payer: Medicare Other | Admitting: *Deleted

## 2014-08-17 ENCOUNTER — Encounter (HOSPITAL_COMMUNITY): Payer: Self-pay

## 2014-08-17 DIAGNOSIS — R55 Syncope and collapse: Secondary | ICD-10-CM

## 2014-08-17 LAB — CUP PACEART REMOTE DEVICE CHECK: Date Time Interrogation Session: 20160513040500

## 2014-08-18 ENCOUNTER — Encounter: Payer: Self-pay | Admitting: Internal Medicine

## 2014-08-18 ENCOUNTER — Encounter (HOSPITAL_COMMUNITY)
Admission: RE | Admit: 2014-08-18 | Discharge: 2014-08-18 | Disposition: A | Discharge status: Home / Self Care | Payer: Self-pay | Source: Ambulatory Visit | Attending: Cardiology | Admitting: Cardiology

## 2014-08-18 ENCOUNTER — Ambulatory Visit (INDEPENDENT_AMBULATORY_CARE_PROVIDER_SITE_OTHER): Payer: Medicare Other | Admitting: Internal Medicine

## 2014-08-18 VITALS — BP 128/72 | HR 62 | Ht 66.0 in | Wt 159.6 lb

## 2014-08-18 DIAGNOSIS — I5022 Chronic systolic (congestive) heart failure: Secondary | ICD-10-CM | POA: Diagnosis not present

## 2014-08-18 DIAGNOSIS — I2583 Coronary atherosclerosis due to lipid rich plaque: Secondary | ICD-10-CM

## 2014-08-18 DIAGNOSIS — R55 Syncope and collapse: Secondary | ICD-10-CM | POA: Diagnosis not present

## 2014-08-18 DIAGNOSIS — I2510 Atherosclerotic heart disease of native coronary artery without angina pectoris: Secondary | ICD-10-CM

## 2014-08-18 LAB — CUP PACEART INCLINIC DEVICE CHECK
Date Time Interrogation Session: 20160517111311
Zone Setting Detection Interval: 2000 ms
Zone Setting Detection Interval: 3000 ms
Zone Setting Detection Interval: 370 ms
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
## 2014-08-18 NOTE — Patient Instructions (Signed)

## 2014-08-18 NOTE — Assessment & Plan Note (Signed)
He is asymptomatic and has had no sustained arrhythmias. Will follow.

## 2014-08-18 NOTE — Assessment & Plan Note (Signed)
He is exercising vigorously with out symptoms. He will continue his meds and exercising.

## 2014-08-18 NOTE — Assessment & Plan Note (Signed)
He is class 2 at worst and is exercising regularly. He EF has near normalized. He will continue his current meds as directed by Dr. London Sheer.
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
## 2014-08-20 ENCOUNTER — Encounter (HOSPITAL_COMMUNITY)
Admission: RE | Admit: 2014-08-20 | Discharge: 2014-08-20 | Disposition: A | Discharge status: Home / Self Care | Payer: Self-pay | Source: Ambulatory Visit | Attending: Cardiology | Admitting: Cardiology

## 2014-08-20 ENCOUNTER — Encounter: Payer: Self-pay | Admitting: Internal Medicine

## 2014-08-21 ENCOUNTER — Ambulatory Visit (INDEPENDENT_AMBULATORY_CARE_PROVIDER_SITE_OTHER): Payer: Medicare Other | Admitting: Cardiology

## 2014-08-21 ENCOUNTER — Encounter: Payer: Self-pay | Admitting: Cardiology

## 2014-08-21 ENCOUNTER — Encounter: Payer: Self-pay | Admitting: Internal Medicine

## 2014-08-21 VITALS — Ht 67.5 in | Wt 154.0 lb

## 2014-08-21 DIAGNOSIS — I2583 Coronary atherosclerosis due to lipid rich plaque: Secondary | ICD-10-CM

## 2014-08-21 DIAGNOSIS — I2510 Atherosclerotic heart disease of native coronary artery without angina pectoris: Secondary | ICD-10-CM | POA: Diagnosis not present

## 2014-08-21 DIAGNOSIS — I10 Essential (primary) hypertension: Secondary | ICD-10-CM

## 2014-08-21 DIAGNOSIS — I480 Paroxysmal atrial fibrillation: Secondary | ICD-10-CM

## 2014-08-21 DIAGNOSIS — E7800 Pure hypercholesterolemia, unspecified: Secondary | ICD-10-CM

## 2014-08-21 DIAGNOSIS — R55 Syncope and collapse: Secondary | ICD-10-CM | POA: Diagnosis not present

## 2014-08-21 NOTE — Progress Notes (Signed)
Loop recorder 

## 2014-08-21 NOTE — Assessment & Plan Note (Signed)
Continue statin. 

## 2014-08-21 NOTE — Assessment & Plan Note (Signed)
Blood pressure is running low. Discontinue carvedilol and Lasix. He will contact us if his weight increases significantly, develops pedal edema or worsening shortness of breath off diuretic.

## 2014-08-21 NOTE — Patient Instructions (Addendum)
Your physician wants you to follow-up in: 6 Months You will receive a reminder letter in the mail two months in advance. If you don't receive a letter, please call our office to schedule the follow-up appointment.  Your physician has recommended you make the following change in your medication: STOP Carvedilol and Furosemide(LASIX)

## 2014-08-21 NOTE — Assessment & Plan Note (Signed)
Continue aspirin and statin. 

## 2014-08-21 NOTE — Assessment & Plan Note (Signed)
No further episodes. Implantable loop monitor in place.

## 2014-08-21 NOTE — Assessment & Plan Note (Signed)
Patient remains in sinus rhythm on examination. Previous atrial fibrillation occurred in the setting of trauma and most likely was related to the stress of that event. I will not resume Coumadin.

## 2014-08-24 ENCOUNTER — Encounter (HOSPITAL_COMMUNITY): Payer: Self-pay

## 2014-08-25 ENCOUNTER — Encounter (HOSPITAL_COMMUNITY): Payer: Self-pay

## 2014-08-27 ENCOUNTER — Encounter (HOSPITAL_COMMUNITY)
Admission: RE | Admit: 2014-08-27 | Discharge: 2014-08-27 | Disposition: A | Discharge status: Home / Self Care | Payer: Self-pay | Source: Ambulatory Visit | Attending: Cardiology | Admitting: Cardiology

## 2014-08-27 ENCOUNTER — Encounter: Payer: Self-pay | Admitting: Internal Medicine

## 2014-09-01 ENCOUNTER — Encounter (HOSPITAL_COMMUNITY): Payer: Self-pay

## 2014-09-03 ENCOUNTER — Encounter (HOSPITAL_COMMUNITY): Payer: Self-pay | Attending: Cardiology

## 2014-09-03 DIAGNOSIS — I252 Old myocardial infarction: Secondary | ICD-10-CM | POA: Insufficient documentation

## 2014-09-03 DIAGNOSIS — Z5189 Encounter for other specified aftercare: Secondary | ICD-10-CM | POA: Insufficient documentation

## 2014-09-03 DIAGNOSIS — Z955 Presence of coronary angioplasty implant and graft: Secondary | ICD-10-CM | POA: Insufficient documentation

## 2014-09-07 ENCOUNTER — Encounter (HOSPITAL_COMMUNITY): Payer: Self-pay

## 2014-09-08 ENCOUNTER — Encounter (HOSPITAL_COMMUNITY): Payer: Self-pay

## 2014-09-10 ENCOUNTER — Encounter (HOSPITAL_COMMUNITY): Payer: Self-pay

## 2014-09-14 ENCOUNTER — Encounter (HOSPITAL_COMMUNITY): Payer: Self-pay

## 2014-09-15 ENCOUNTER — Encounter (HOSPITAL_COMMUNITY): Payer: Self-pay

## 2014-09-16 ENCOUNTER — Encounter: Payer: Self-pay | Admitting: Internal Medicine

## 2014-09-16 ENCOUNTER — Ambulatory Visit (INDEPENDENT_AMBULATORY_CARE_PROVIDER_SITE_OTHER): Payer: Medicare Other | Admitting: *Deleted

## 2014-09-16 DIAGNOSIS — R55 Syncope and collapse: Secondary | ICD-10-CM | POA: Diagnosis not present

## 2014-09-16 NOTE — Progress Notes (Signed)
Loop recorder 

## 2014-09-17 ENCOUNTER — Encounter (HOSPITAL_COMMUNITY): Payer: Self-pay

## 2014-09-18 ENCOUNTER — Encounter: Payer: Self-pay | Admitting: Internal Medicine

## 2014-09-21 ENCOUNTER — Encounter: Payer: Self-pay | Admitting: Internal Medicine

## 2014-09-21 ENCOUNTER — Encounter (HOSPITAL_COMMUNITY): Payer: Self-pay

## 2014-09-22 ENCOUNTER — Encounter: Payer: Self-pay | Admitting: Internal Medicine

## 2014-09-22 ENCOUNTER — Encounter (INDEPENDENT_AMBULATORY_CARE_PROVIDER_SITE_OTHER): Payer: Medicare Other | Admitting: Ophthalmology

## 2014-09-22 ENCOUNTER — Encounter (HOSPITAL_COMMUNITY): Payer: Self-pay

## 2014-09-22 DIAGNOSIS — H338 Other retinal detachments: Secondary | ICD-10-CM

## 2014-09-22 DIAGNOSIS — H43813 Vitreous degeneration, bilateral: Secondary | ICD-10-CM | POA: Diagnosis not present

## 2014-09-22 DIAGNOSIS — H35033 Hypertensive retinopathy, bilateral: Secondary | ICD-10-CM

## 2014-09-22 DIAGNOSIS — E11311 Type 2 diabetes mellitus with unspecified diabetic retinopathy with macular edema: Secondary | ICD-10-CM

## 2014-09-22 DIAGNOSIS — E11331 Type 2 diabetes mellitus with moderate nonproliferative diabetic retinopathy with macular edema: Secondary | ICD-10-CM | POA: Diagnosis not present

## 2014-09-22 DIAGNOSIS — I10 Essential (primary) hypertension: Secondary | ICD-10-CM | POA: Diagnosis not present

## 2014-09-24 ENCOUNTER — Encounter (HOSPITAL_COMMUNITY): Payer: Self-pay

## 2014-09-24 LAB — CUP PACEART REMOTE DEVICE CHECK: Date Time Interrogation Session: 20160615040500

## 2014-09-25 ENCOUNTER — Encounter: Payer: Self-pay | Admitting: Internal Medicine

## 2014-09-28 ENCOUNTER — Encounter (HOSPITAL_COMMUNITY): Payer: Self-pay

## 2014-09-29 ENCOUNTER — Encounter (HOSPITAL_COMMUNITY): Payer: Self-pay

## 2014-09-29 ENCOUNTER — Telehealth: Payer: Self-pay | Admitting: Cardiology

## 2014-09-29 MED ORDER — CLOPIDOGREL BISULFATE 75 MG PO TABS: 75.0000 mg | ORAL_TABLET | Freq: Every day | ORAL | Status: DC

## 2014-09-29 NOTE — Telephone Encounter (Signed)
Spoke with pt, aware script has been sent to the pharm 

## 2014-09-29 NOTE — Telephone Encounter (Signed)
Pt called in requesting a prescription for Clopedigrel to be called in the  [place] on [address]. He states that he has reached the donut hole with his Brilinta and he can no longer afford it. Please call  Thanks

## 2014-09-29 NOTE — Telephone Encounter (Signed)
DC brilinta and begin plavix 75 mg daily Kirk Ruths

## 2014-09-29 NOTE — Telephone Encounter (Signed)
Will forward to dr [name] for okay to change from brilinta to plavix

## 2014-10-01 ENCOUNTER — Encounter (HOSPITAL_COMMUNITY): Payer: Self-pay

## 2014-10-06 ENCOUNTER — Encounter (HOSPITAL_COMMUNITY): Payer: Self-pay

## 2014-10-08 ENCOUNTER — Encounter (HOSPITAL_COMMUNITY): Payer: Self-pay

## 2014-10-09 DIAGNOSIS — Z1211 Encounter for screening for malignant neoplasm of colon: Secondary | ICD-10-CM | POA: Diagnosis not present

## 2014-10-09 DIAGNOSIS — Z23 Encounter for immunization: Secondary | ICD-10-CM | POA: Diagnosis not present

## 2014-10-09 DIAGNOSIS — I2510 Atherosclerotic heart disease of native coronary artery without angina pectoris: Secondary | ICD-10-CM | POA: Diagnosis not present

## 2014-10-09 DIAGNOSIS — E785 Hyperlipidemia, unspecified: Secondary | ICD-10-CM | POA: Diagnosis not present

## 2014-10-09 DIAGNOSIS — E1169 Type 2 diabetes mellitus with other specified complication: Secondary | ICD-10-CM | POA: Diagnosis not present

## 2014-10-12 ENCOUNTER — Encounter (HOSPITAL_COMMUNITY): Payer: Self-pay

## 2014-10-13 ENCOUNTER — Encounter: Payer: Self-pay | Admitting: Internal Medicine

## 2014-10-13 ENCOUNTER — Encounter (HOSPITAL_COMMUNITY): Payer: Self-pay

## 2014-10-14 ENCOUNTER — Encounter: Payer: Self-pay | Admitting: Internal Medicine

## 2014-10-15 ENCOUNTER — Encounter (HOSPITAL_COMMUNITY): Payer: Self-pay

## 2014-10-16 ENCOUNTER — Encounter: Payer: Self-pay | Admitting: Internal Medicine

## 2014-10-16 ENCOUNTER — Ambulatory Visit (INDEPENDENT_AMBULATORY_CARE_PROVIDER_SITE_OTHER): Payer: Medicare Other | Admitting: *Deleted

## 2014-10-16 DIAGNOSIS — R55 Syncope and collapse: Secondary | ICD-10-CM | POA: Diagnosis not present

## 2014-10-19 ENCOUNTER — Encounter (HOSPITAL_COMMUNITY): Payer: Self-pay

## 2014-10-20 ENCOUNTER — Encounter (HOSPITAL_COMMUNITY): Payer: Self-pay

## 2014-10-20 NOTE — Progress Notes (Signed)
Loop recorder 

## 2014-10-22 ENCOUNTER — Encounter (HOSPITAL_COMMUNITY): Payer: Self-pay

## 2014-10-23 LAB — CUP PACEART REMOTE DEVICE CHECK: Date Time Interrogation Session: 20160719040500

## 2014-10-26 ENCOUNTER — Encounter (HOSPITAL_COMMUNITY): Payer: Self-pay

## 2014-10-27 ENCOUNTER — Encounter (HOSPITAL_COMMUNITY): Payer: Self-pay

## 2014-10-29 ENCOUNTER — Encounter (HOSPITAL_COMMUNITY): Payer: Self-pay

## 2014-11-02 ENCOUNTER — Encounter (HOSPITAL_COMMUNITY): Payer: Self-pay

## 2014-11-03 ENCOUNTER — Encounter (HOSPITAL_COMMUNITY): Payer: Self-pay

## 2014-11-05 ENCOUNTER — Encounter: Payer: Self-pay | Admitting: Internal Medicine

## 2014-11-05 ENCOUNTER — Encounter (HOSPITAL_COMMUNITY): Payer: Self-pay

## 2014-11-09 ENCOUNTER — Encounter (HOSPITAL_COMMUNITY): Payer: Self-pay

## 2014-11-10 ENCOUNTER — Encounter (HOSPITAL_COMMUNITY): Payer: Self-pay

## 2014-11-11 ENCOUNTER — Encounter: Payer: Self-pay | Admitting: Internal Medicine

## 2014-11-12 ENCOUNTER — Encounter (HOSPITAL_COMMUNITY): Payer: Self-pay

## 2014-11-12 ENCOUNTER — Encounter (INDEPENDENT_AMBULATORY_CARE_PROVIDER_SITE_OTHER): Payer: Medicare Other | Admitting: Ophthalmology

## 2014-11-12 ENCOUNTER — Encounter: Payer: Self-pay | Admitting: Internal Medicine

## 2014-11-16 ENCOUNTER — Encounter (HOSPITAL_COMMUNITY): Payer: Self-pay

## 2014-11-16 ENCOUNTER — Ambulatory Visit (INDEPENDENT_AMBULATORY_CARE_PROVIDER_SITE_OTHER): Payer: Medicare Other | Admitting: *Deleted

## 2014-11-16 DIAGNOSIS — R55 Syncope and collapse: Secondary | ICD-10-CM | POA: Diagnosis not present

## 2014-11-17 ENCOUNTER — Encounter (HOSPITAL_COMMUNITY): Payer: Self-pay

## 2014-11-17 NOTE — Progress Notes (Signed)
Loop recorder 

## 2014-11-19 ENCOUNTER — Encounter (INDEPENDENT_AMBULATORY_CARE_PROVIDER_SITE_OTHER): Payer: Medicare Other | Admitting: Ophthalmology

## 2014-11-19 ENCOUNTER — Encounter (HOSPITAL_COMMUNITY): Payer: Self-pay

## 2014-11-19 DIAGNOSIS — E11331 Type 2 diabetes mellitus with moderate nonproliferative diabetic retinopathy with macular edema: Secondary | ICD-10-CM | POA: Diagnosis not present

## 2014-11-19 DIAGNOSIS — H35033 Hypertensive retinopathy, bilateral: Secondary | ICD-10-CM | POA: Diagnosis not present

## 2014-11-19 DIAGNOSIS — E11311 Type 2 diabetes mellitus with unspecified diabetic retinopathy with macular edema: Secondary | ICD-10-CM | POA: Diagnosis not present

## 2014-11-19 DIAGNOSIS — I10 Essential (primary) hypertension: Secondary | ICD-10-CM | POA: Diagnosis not present

## 2014-11-19 DIAGNOSIS — H43812 Vitreous degeneration, left eye: Secondary | ICD-10-CM | POA: Diagnosis not present

## 2014-11-19 DIAGNOSIS — H338 Other retinal detachments: Secondary | ICD-10-CM | POA: Diagnosis not present

## 2014-11-19 DIAGNOSIS — E11339 Type 2 diabetes mellitus with moderate nonproliferative diabetic retinopathy without macular edema: Secondary | ICD-10-CM | POA: Diagnosis not present

## 2014-11-23 ENCOUNTER — Encounter (HOSPITAL_COMMUNITY): Payer: Self-pay

## 2014-11-24 ENCOUNTER — Telehealth: Payer: Self-pay | Admitting: *Deleted

## 2014-11-24 ENCOUNTER — Encounter (HOSPITAL_COMMUNITY): Payer: Self-pay

## 2014-11-24 NOTE — Telephone Encounter (Signed)
Called patient to schedule appointment to see [facility] regarding LINQ reprogramming for oversensing and undersensing.  [employer] rep aware of appointment and will be present.  Patient appreciative of call and denies any questions or concerns at this time.

## 2014-11-26 ENCOUNTER — Ambulatory Visit (INDEPENDENT_AMBULATORY_CARE_PROVIDER_SITE_OTHER): Payer: Medicare Other | Admitting: *Deleted

## 2014-11-26 ENCOUNTER — Encounter (HOSPITAL_COMMUNITY): Payer: Self-pay

## 2014-11-26 DIAGNOSIS — R55 Syncope and collapse: Secondary | ICD-10-CM

## 2014-11-26 LAB — CUP PACEART INCLINIC DEVICE CHECK: Date Time Interrogation Session: 20160825102938

## 2014-11-26 NOTE — Progress Notes (Signed)
Loop check in clinic for programming changes.  Sensing threshold decay delay changed from 175ms to 457ms. Pause duration from 3 sec to 4.5 seconds. Monthly Carelink summary reports. ROV with GT in May.

## 2014-11-30 ENCOUNTER — Encounter: Payer: Medicare Other | Admitting: Gastroenterology

## 2014-11-30 ENCOUNTER — Encounter (HOSPITAL_COMMUNITY): Payer: Self-pay

## 2014-11-30 LAB — CUP PACEART REMOTE DEVICE CHECK: Date Time Interrogation Session: 20160829110934

## 2014-12-01 ENCOUNTER — Encounter (HOSPITAL_COMMUNITY): Payer: Self-pay

## 2014-12-03 ENCOUNTER — Encounter: Payer: Self-pay | Admitting: Internal Medicine

## 2014-12-04 ENCOUNTER — Encounter: Payer: Self-pay | Admitting: Internal Medicine

## 2014-12-08 ENCOUNTER — Encounter: Payer: Medicare Other | Admitting: Internal Medicine

## 2014-12-09 ENCOUNTER — Encounter: Payer: Self-pay | Admitting: Internal Medicine

## 2014-12-15 ENCOUNTER — Encounter: Payer: Self-pay | Admitting: Internal Medicine

## 2014-12-15 ENCOUNTER — Ambulatory Visit (INDEPENDENT_AMBULATORY_CARE_PROVIDER_SITE_OTHER): Payer: Medicare Other | Admitting: *Deleted

## 2014-12-15 ENCOUNTER — Encounter: Payer: Self-pay | Admitting: Family Medicine

## 2014-12-15 DIAGNOSIS — R55 Syncope and collapse: Secondary | ICD-10-CM

## 2014-12-16 NOTE — Progress Notes (Signed)
Loop recorder 

## 2014-12-28 ENCOUNTER — Other Ambulatory Visit: Payer: Self-pay | Admitting: Cardiology

## 2014-12-28 ENCOUNTER — Other Ambulatory Visit: Payer: Self-pay

## 2014-12-28 DIAGNOSIS — I480 Paroxysmal atrial fibrillation: Secondary | ICD-10-CM

## 2014-12-28 DIAGNOSIS — I2583 Coronary atherosclerosis due to lipid rich plaque: Principal | ICD-10-CM

## 2014-12-28 DIAGNOSIS — I2510 Atherosclerotic heart disease of native coronary artery without angina pectoris: Secondary | ICD-10-CM

## 2014-12-28 DIAGNOSIS — E7800 Pure hypercholesterolemia, unspecified: Secondary | ICD-10-CM

## 2014-12-28 DIAGNOSIS — I10 Essential (primary) hypertension: Secondary | ICD-10-CM

## 2014-12-28 MED ORDER — PANTOPRAZOLE SODIUM 40 MG PO TBEC: 40.0000 mg | DELAYED_RELEASE_TABLET | Freq: Every day | ORAL | Status: DC

## 2014-12-28 MED ORDER — CLOPIDOGREL BISULFATE 75 MG PO TABS: 75.0000 mg | ORAL_TABLET | Freq: Every day | ORAL | Status: DC

## 2014-12-28 MED ORDER — FUROSEMIDE 40 MG PO TABS: 40.0000 mg | ORAL_TABLET | Freq: Every day | ORAL | Status: DC

## 2014-12-28 MED ORDER — PRAVASTATIN SODIUM 80 MG PO TABS: 80.0000 mg | ORAL_TABLET | Freq: Every day | ORAL | Status: DC

## 2014-12-28 MED ORDER — GLIPIZIDE 5 MG PO TABS: 7.5000 mg | ORAL_TABLET | Freq: Every evening | ORAL | Status: DC

## 2014-12-29 ENCOUNTER — Encounter (INDEPENDENT_AMBULATORY_CARE_PROVIDER_SITE_OTHER): Payer: Medicare Other | Admitting: Ophthalmology

## 2014-12-30 ENCOUNTER — Other Ambulatory Visit: Payer: Self-pay | Admitting: *Deleted

## 2014-12-30 MED ORDER — GLIPIZIDE 5 MG PO TABS: 7.5000 mg | ORAL_TABLET | Freq: Every evening | ORAL | Status: DC

## 2014-12-30 NOTE — Telephone Encounter (Signed)
For paper refill.

## 2015-01-09 LAB — CUP PACEART REMOTE DEVICE CHECK: Date Time Interrogation Session: 20160913153720

## 2015-01-09 NOTE — Progress Notes (Signed)
Carelink summary report received. Battery status OK. Normal device function. No new symptom episodes or brady episodes. No new AF episodes. 5 tachy episodes---EGMs show T-wave oversensing (pt has significant T-waves). 5 pauses---all undersensing. Monthly summary reports and ROV w/ GT 5/17.

## 2015-01-12 DIAGNOSIS — E1122 Type 2 diabetes mellitus with diabetic chronic kidney disease: Secondary | ICD-10-CM | POA: Diagnosis not present

## 2015-01-12 DIAGNOSIS — R946 Abnormal results of thyroid function studies: Secondary | ICD-10-CM | POA: Diagnosis not present

## 2015-01-12 DIAGNOSIS — Z0000 Encounter for general adult medical examination without abnormal findings: Secondary | ICD-10-CM | POA: Diagnosis not present

## 2015-01-12 DIAGNOSIS — E785 Hyperlipidemia, unspecified: Secondary | ICD-10-CM | POA: Diagnosis not present

## 2015-01-12 DIAGNOSIS — N181 Chronic kidney disease, stage 1: Secondary | ICD-10-CM | POA: Diagnosis not present

## 2015-01-14 ENCOUNTER — Ambulatory Visit (INDEPENDENT_AMBULATORY_CARE_PROVIDER_SITE_OTHER): Payer: Medicare Other | Admitting: *Deleted

## 2015-01-14 DIAGNOSIS — R55 Syncope and collapse: Secondary | ICD-10-CM

## 2015-01-20 NOTE — Progress Notes (Signed)
LOOP RECORDER  

## 2015-01-21 ENCOUNTER — Encounter (INDEPENDENT_AMBULATORY_CARE_PROVIDER_SITE_OTHER): Payer: Medicare Other | Admitting: Ophthalmology

## 2015-01-21 DIAGNOSIS — E113313 Type 2 diabetes mellitus with moderate nonproliferative diabetic retinopathy with macular edema, bilateral: Secondary | ICD-10-CM

## 2015-01-21 DIAGNOSIS — I10 Essential (primary) hypertension: Secondary | ICD-10-CM

## 2015-01-21 DIAGNOSIS — E11311 Type 2 diabetes mellitus with unspecified diabetic retinopathy with macular edema: Secondary | ICD-10-CM

## 2015-01-21 DIAGNOSIS — H338 Other retinal detachments: Secondary | ICD-10-CM | POA: Diagnosis not present

## 2015-01-21 DIAGNOSIS — H35033 Hypertensive retinopathy, bilateral: Secondary | ICD-10-CM

## 2015-01-21 DIAGNOSIS — H43813 Vitreous degeneration, bilateral: Secondary | ICD-10-CM | POA: Diagnosis not present

## 2015-01-22 LAB — CUP PACEART REMOTE DEVICE CHECK: Date Time Interrogation Session: 20161013153547

## 2015-01-22 NOTE — Progress Notes (Signed)
Carelink summary report received. Battery status OK. Normal device function. No new symptom episodes, brady, or pause episodes. No new AF episodes, +ASA 81mg  and Plavix. 12 tachy episodes--all T-wave oversensing. Monthly summary reports and ROV with GT in 08/2015.

## 2015-02-15 ENCOUNTER — Ambulatory Visit (INDEPENDENT_AMBULATORY_CARE_PROVIDER_SITE_OTHER): Payer: Medicare Other | Admitting: *Deleted

## 2015-02-15 DIAGNOSIS — R55 Syncope and collapse: Secondary | ICD-10-CM | POA: Diagnosis not present

## 2015-02-15 NOTE — Progress Notes (Signed)
Carelink summary report / loop recorder 

## 2015-02-19 IMAGING — CR DG CHEST 1V PORT
1 series · 1 of 1 positions shown · non-contrast
Comparison: 08/30/2013, 08/29/2013

CLINICAL DATA: Pleural effusion, atelectasis, CABG 08/28/2013

EXAM:
PORTABLE CHEST - 1 VIEW

[portable]
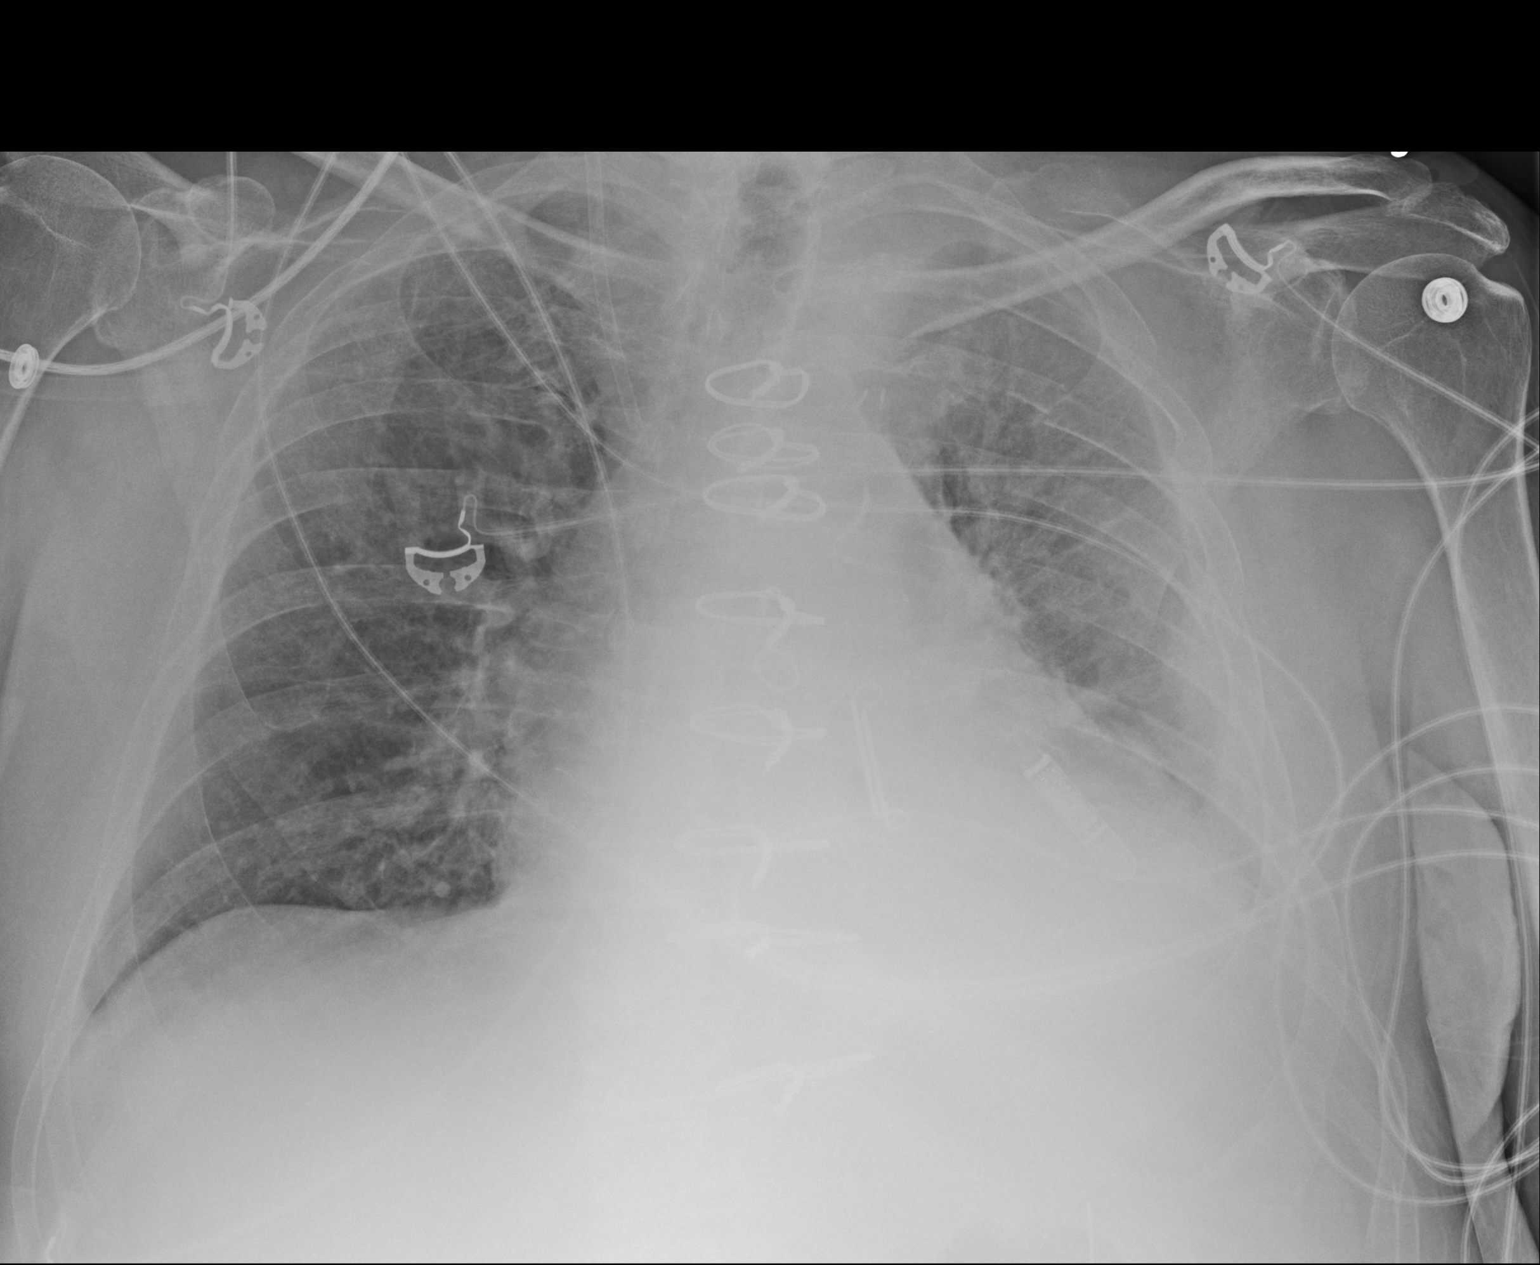

[1 of 1 positions shown; findings below may reference images not displayed]

FINDINGS: Mild bilateral interstitial thickening. Small left pleural effusion.
No pneumothorax. Left basilar airspace disease likely reflecting
atelectasis. Stable cardiomegaly. Prior CABG. Right-sided jugular
sheath in unchanged position. Left posterior fourth rib fracture.
IMPRESSION: No significant interval change compared with the prior exam. Mild
interstitial edema small left pleural effusion.

## 2015-02-24 ENCOUNTER — Encounter: Payer: Self-pay | Admitting: *Deleted
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
## 2015-03-03 ENCOUNTER — Encounter: Payer: Self-pay | Admitting: Cardiology

## 2015-03-03 ENCOUNTER — Ambulatory Visit (INDEPENDENT_AMBULATORY_CARE_PROVIDER_SITE_OTHER): Payer: Medicare Other | Admitting: Cardiology

## 2015-03-03 VITALS — BP 110/60 | HR 82 | Ht 67.5 in | Wt 136.6 lb

## 2015-03-03 DIAGNOSIS — I2510 Atherosclerotic heart disease of native coronary artery without angina pectoris: Secondary | ICD-10-CM

## 2015-03-03 DIAGNOSIS — R55 Syncope and collapse: Secondary | ICD-10-CM

## 2015-03-03 DIAGNOSIS — I2583 Coronary atherosclerosis due to lipid rich plaque: Secondary | ICD-10-CM

## 2015-03-03 MED ORDER — ATORVASTATIN CALCIUM 80 MG PO TABS: 80.0000 mg | ORAL_TABLET | Freq: Every day | ORAL | Status: DC

## 2015-03-03 MED ORDER — FUROSEMIDE 20 MG PO TABS: 20.0000 mg | ORAL_TABLET | Freq: Every day | ORAL | Status: DC

## 2015-03-03 NOTE — Assessment & Plan Note (Signed)
Blood pressure controlled. Continue present medications. 

## 2015-03-03 NOTE — Assessment & Plan Note (Signed)
Given documented coronary artery disease we will discontinue pravastatin and instead treat with Lipitor 80 mg daily. Check lipids and liver 4 weeks after initiating.

## 2015-03-03 NOTE — Assessment & Plan Note (Signed)
Patient's LV function has improved.Not volume overloaded on examination. Decrease Lasix to 20 mg daily.

## 2015-03-03 NOTE — Patient Instructions (Signed)
Medication Instructions:   STOP PLAVIX  DECREASE FUROSEMIDE TO 20 MG ONCE DAILY  STOP PRAVASTATIN IN January=THEN START ATORVASTATIN 80 MG ONCE DAILY   Labwork:  Your physician recommends that you return for lab work in: 4 WEEKS= DO NOT EAT PRIOR TO LAB WORK   Follow-Up:  Your physician wants you to follow-up in: [place] will receive a reminder letter in the mail two months in advance. If you don't receive a letter, please call our office to schedule the follow-up appointment.    If you need a refill on your cardiac medications before your next appointment, please call your pharmacy.

## 2015-03-03 NOTE — Assessment & Plan Note (Signed)
Patient is now over one year following previous stent. Continue aspirin and statin. Discontinue Plavix.

## 2015-03-03 NOTE — Assessment & Plan Note (Signed)
Patient remains in sinus rhythm. Previous atrial fibrillation occurred in the setting of trauma and most likely was related to the stress of that event. I will not resume Coumadin.

## 2015-03-03 NOTE — Assessment & Plan Note (Signed)
No further episodes. Loop monitor in place.

## 2015-03-15 ENCOUNTER — Ambulatory Visit (INDEPENDENT_AMBULATORY_CARE_PROVIDER_SITE_OTHER): Payer: Medicare Other | Admitting: *Deleted

## 2015-03-15 DIAGNOSIS — R55 Syncope and collapse: Secondary | ICD-10-CM

## 2015-03-15 NOTE — Progress Notes (Signed)
Carelink Summary Report / Loop Recorder 

## 2015-03-22 LAB — CUP PACEART REMOTE DEVICE CHECK: Date Time Interrogation Session: 20161112153756

## 2015-03-23 ENCOUNTER — Encounter (INDEPENDENT_AMBULATORY_CARE_PROVIDER_SITE_OTHER): Payer: Medicare Other | Admitting: Ophthalmology

## 2015-03-23 DIAGNOSIS — E11311 Type 2 diabetes mellitus with unspecified diabetic retinopathy with macular edema: Secondary | ICD-10-CM

## 2015-03-23 DIAGNOSIS — H338 Other retinal detachments: Secondary | ICD-10-CM | POA: Diagnosis not present

## 2015-03-23 DIAGNOSIS — I10 Essential (primary) hypertension: Secondary | ICD-10-CM | POA: Diagnosis not present

## 2015-03-23 DIAGNOSIS — H43812 Vitreous degeneration, left eye: Secondary | ICD-10-CM

## 2015-03-23 DIAGNOSIS — E113313 Type 2 diabetes mellitus with moderate nonproliferative diabetic retinopathy with macular edema, bilateral: Secondary | ICD-10-CM | POA: Diagnosis not present

## 2015-03-23 DIAGNOSIS — H35033 Hypertensive retinopathy, bilateral: Secondary | ICD-10-CM | POA: Diagnosis not present

## 2015-04-06 ENCOUNTER — Other Ambulatory Visit: Payer: Self-pay | Admitting: Cardiology

## 2015-04-06 NOTE — Telephone Encounter (Signed)
°*  STAT* If patient is at the pharmacy, call can be transferred to refill team.   1. Which medications need to be refilled? (please list name of each medication and dose if known) Pantoprazole  2. Which pharmacy/location (including street and city if local pharmacy) is medication to be sent to?[facility]  3. Do they need a 30 day or 90 day supply? 90 days supply and refills

## 2015-04-07 MED ORDER — PANTOPRAZOLE SODIUM 40 MG PO TBEC: 40.0000 mg | DELAYED_RELEASE_TABLET | Freq: Every day | ORAL | Status: DC

## 2015-04-07 NOTE — Telephone Encounter (Signed)
Rx(s) sent to pharmacy electronically.  

## 2015-04-14 ENCOUNTER — Ambulatory Visit (INDEPENDENT_AMBULATORY_CARE_PROVIDER_SITE_OTHER): Payer: Medicare Other | Admitting: *Deleted

## 2015-04-14 DIAGNOSIS — S2242XA Multiple fractures of ribs, left side, initial encounter for closed fracture: Secondary | ICD-10-CM | POA: Diagnosis not present

## 2015-04-14 DIAGNOSIS — R079 Chest pain, unspecified: Secondary | ICD-10-CM | POA: Diagnosis not present

## 2015-04-14 DIAGNOSIS — R55 Syncope and collapse: Secondary | ICD-10-CM | POA: Diagnosis not present

## 2015-04-14 DIAGNOSIS — M7731 Calcaneal spur, right foot: Secondary | ICD-10-CM | POA: Diagnosis not present

## 2015-04-14 NOTE — Progress Notes (Signed)
Carelink Summary Report / Loop Recorder 

## 2015-04-20 DIAGNOSIS — J40 Bronchitis, not specified as acute or chronic: Secondary | ICD-10-CM | POA: Diagnosis not present

## 2015-04-20 DIAGNOSIS — E11319 Type 2 diabetes mellitus with unspecified diabetic retinopathy without macular edema: Secondary | ICD-10-CM | POA: Diagnosis not present

## 2015-04-20 DIAGNOSIS — E785 Hyperlipidemia, unspecified: Secondary | ICD-10-CM | POA: Diagnosis not present

## 2015-04-21 DIAGNOSIS — Z23 Encounter for immunization: Secondary | ICD-10-CM | POA: Diagnosis not present

## 2015-05-04 ENCOUNTER — Encounter (INDEPENDENT_AMBULATORY_CARE_PROVIDER_SITE_OTHER): Payer: Medicare Other | Admitting: Ophthalmology

## 2015-05-04 DIAGNOSIS — H35033 Hypertensive retinopathy, bilateral: Secondary | ICD-10-CM

## 2015-05-04 DIAGNOSIS — E113313 Type 2 diabetes mellitus with moderate nonproliferative diabetic retinopathy with macular edema, bilateral: Secondary | ICD-10-CM

## 2015-05-04 DIAGNOSIS — E11311 Type 2 diabetes mellitus with unspecified diabetic retinopathy with macular edema: Secondary | ICD-10-CM

## 2015-05-04 DIAGNOSIS — H338 Other retinal detachments: Secondary | ICD-10-CM

## 2015-05-04 DIAGNOSIS — H43812 Vitreous degeneration, left eye: Secondary | ICD-10-CM | POA: Diagnosis not present

## 2015-05-04 DIAGNOSIS — I10 Essential (primary) hypertension: Secondary | ICD-10-CM | POA: Diagnosis not present

## 2015-05-07 LAB — CUP PACEART REMOTE DEVICE CHECK: Date Time Interrogation Session: 20161211050500

## 2015-05-14 ENCOUNTER — Ambulatory Visit (INDEPENDENT_AMBULATORY_CARE_PROVIDER_SITE_OTHER): Payer: Medicare Other | Admitting: *Deleted

## 2015-05-14 DIAGNOSIS — R55 Syncope and collapse: Secondary | ICD-10-CM | POA: Diagnosis not present

## 2015-05-14 NOTE — Progress Notes (Signed)
Carelink Summary Report / Loop Recorder 

## 2015-05-18 DIAGNOSIS — J40 Bronchitis, not specified as acute or chronic: Secondary | ICD-10-CM | POA: Diagnosis not present

## 2015-05-24 ENCOUNTER — Ambulatory Visit: Payer: Medicare Other | Admitting: Pulmonary Disease

## 2015-05-24 ENCOUNTER — Ambulatory Visit (INDEPENDENT_AMBULATORY_CARE_PROVIDER_SITE_OTHER): Payer: Medicare Other | Admitting: Internal Medicine

## 2015-05-24 ENCOUNTER — Encounter: Payer: Self-pay | Admitting: Internal Medicine

## 2015-05-24 VITALS — BP 152/88 | HR 80 | Ht 67.5 in | Wt 146.0 lb

## 2015-05-24 DIAGNOSIS — J4530 Mild persistent asthma, uncomplicated: Secondary | ICD-10-CM

## 2015-05-24 MED ORDER — BUDESONIDE-FORMOTEROL FUMARATE 80-4.5 MCG/ACT IN AERO: INHALATION_SPRAY | RESPIRATORY_TRACT | Status: AC

## 2015-05-24 MED ORDER — PREDNISONE 10 MG PO TABS: ORAL_TABLET | ORAL | Status: DC

## 2015-05-24 NOTE — Patient Instructions (Addendum)
Symbicort 80 Take 2 puffs first thing in am and then another 2 puffs about 12 hours later.   Only use your albuterol (combivent) as a rescue medication to be used if you can't catch your breath by resting or doing a relaxed purse lip breathing pattern.  - The less you use it, the better it will work when you need it. - Ok to use up to 1 puffs  every 4 hours if you must but call for immediate appointment if use goes up over your usual need - Don't leave home without it !!  (think of it like the spare tire for your car)   Prednisone 10 mg take  4 each am x 2 days,   2 each am x 2 days,  1 each am x 2 days and stop    Please schedule a follow up office visit in 6 weeks, call sooner if needed with pfts on return (ok to push back if needed for same day evaluation)

## 2015-05-24 NOTE — Progress Notes (Signed)
Subjective:     Patient ID: Gerald Rosario, male   DOB: [birth year],    MRN: [id number]  HPI  79 yowm quit smoking 1982 with dx of asthma eval in 2011 in pulmonary clinic 2011 with pfts showing min non-specific mid flow flow reduction then and symptoms well controlled on dulera 100 2bid    05/24/2015 1st [place] [facility] office visit/ Szymon Foiles   Chief Complaint  Patient presents with  . Pulmonary Consult    Last seen in 2011. Pt states he started having chest congestion 1 month ago-cleared up after amoxicillin. He then started having increased SOB about 2 wks ago. He has minimal cough- prod with min clear sputum. Breathing is esp worse when he lies down. He is using combivent 2 puffs 6 x daily on average.   doing fine first of year 2017  while on advair 115 then stopped it when ran out around mid Jan 2017  and changed to combivent   Up to 2 pffs every 4 hours (confused with instructions per the [place] in [place])  and gradually losing ground with min prod cough and sob with exertion and at hs where has to sleep now in recliner - combivent was helping but not now  No obvious day to day or daytime variability or assoc excess/ purulent sputum or mucus plugs  or cp or chest tightness, subjective wheeze or overt sinus or hb symptoms. No unusual exp hx or h/o childhood pna/ asthma or knowledge of premature birth.  Sleeping ok without nocturnal  or early am exacerbation  of respiratory  c/o's or need for noct saba. Also denies any obvious fluctuation of symptoms with weather or environmental changes or other aggravating or alleviating factors except as outlined above   Current Medications, Allergies, Complete Past Medical History, Past Surgical History, Family History, and Social History were reviewed in [employer] record.  ROS  The following are not active complaints unless bolded sore throat, dysphagia, dental problems, itching, sneezing,  nasal congestion or excess/ purulent  secretions, ear ache,   fever, chills, sweats, unintended wt loss, classically pleuritic or exertional cp, hemoptysis,  orthopnea pnd or leg swelling, presyncope, palpitations, abdominal pain, anorexia, nausea, vomiting, diarrhea  or change in bowel or bladder habits, change in stools or urine, dysuria,hematuria,  rash, arthralgias, visual complaints, headache, numbness, weakness or ataxia or problems with walking or coordination,  change in mood/affect or memory.            Review of Systems     Objective:   Physical Exam    amb hoarse wm nad  Wt Readings from Last 3 Encounters:  05/24/15 146 lb (66.225 kg)  03/03/15 136 lb 9 oz (61.944 kg)  08/21/14 154 lb (69.854 kg)    Vital signs reviewed   HEENT: nl dentition, turbinates, and oropharynx. Nl external ear canals without cough reflex   NECK :  without JVD/Nodes/TM/ nl carotid upstrokes bilaterally   LUNGS: no acc muscle use,  Nl contour chest  With insp and exp wheezes / rhonchi bilterally    CV:  RRR  no s3 or murmur or increase in P2, no edema   ABD:  soft and nontender with nl inspiratory excursion in the supine position. No bruits or organomegaly, bowel sounds nl  MS:  Nl gait/ ext warm without deformities, calf tenderness, cyanosis or clubbing No obvious joint restrictions   SKIN: warm and dry without lesions    NEURO:  alert, approp, nl sensorium with  no motor deficits        Assessment:

## 2015-05-24 NOTE — Assessment & Plan Note (Addendum)
Clearly deteriorated off ICS. DDX of  difficult airways management almost all start with A and  include Adherence, Ace Inhibitors, Acid Reflux, Active Sinus Disease, Alpha 1 Antitripsin deficiency, Anxiety masquerading as Airways dz,  ABPA,  Allergy(esp in young), Aspiration (esp in elderly), Adverse effects of meds,  Active smokers, A bunch of PE's (a small clot burden can't cause this syndrome unless there is already severe underlying pulm or vascular dz with poor reserve) plus two Bs  = Bronchiectasis and Beta blocker use..and one C= CHF  In this case Adherence is the biggest issue and starts with  inability to use HFA effectively and also  understand that SABA treats the symptoms but doesn't get to the underlying problem (inflammation).  I used  the analogy of putting steroid cream on a rash to help explain the meaning of topical therapy and the need to get the drug to the target tissue > rec resume ICS in form of symbicort 80 2bid and f/u with full pfts   ? Acid (or non-acid) GERD > always difficult to exclude as up to 75% of pts in some series report no assoc GI/ Heartburn symptoms> rec continue max (24h)  acid suppression and diet restrictions/ reviewed      ? Allergy component > ICS dose may need to be titrated up though does risk exac uacs > for now rx Prednisone 10 mg take  4 each am x 2 days,   2 each am x 2 days,  1 each am x 2 days and stop   I had an extended discussion with the patient reviewing all relevant studies completed to date and  lasting 35 minutes of a 60 minute visit    Each maintenance medication was reviewed in detail including most importantly the difference between maintenance and prns and under what circumstances the prns are to be triggered using an action plan format that is not reflected in the computer generated alphabetically organized AVS.    Please see instructions for details which were reviewed in writing and the patient given a copy highlighting the part that I  personally wrote and discussed at today's ov.

## 2015-06-10 LAB — CUP PACEART REMOTE DEVICE CHECK: Date Time Interrogation Session: 20170111154410

## 2015-06-10 NOTE — Progress Notes (Signed)
Carelink summary report received. Battery status OK. Normal device function. No new symptom episodes, brady, or pause episodes. No new AF episodes. 1 tachy episode that appears to be TWOS, not a true tachy event. Monthly summary reports and ROV/PRN

## 2015-06-14 ENCOUNTER — Ambulatory Visit (INDEPENDENT_AMBULATORY_CARE_PROVIDER_SITE_OTHER): Payer: Medicare Other | Admitting: *Deleted

## 2015-06-14 DIAGNOSIS — R55 Syncope and collapse: Secondary | ICD-10-CM | POA: Diagnosis not present

## 2015-06-14 NOTE — Progress Notes (Signed)
Carelink Summary Report / Loop Recorder 

## 2015-06-15 ENCOUNTER — Encounter (INDEPENDENT_AMBULATORY_CARE_PROVIDER_SITE_OTHER): Payer: Medicare Other | Admitting: Ophthalmology

## 2015-06-15 DIAGNOSIS — E113313 Type 2 diabetes mellitus with moderate nonproliferative diabetic retinopathy with macular edema, bilateral: Secondary | ICD-10-CM

## 2015-06-15 DIAGNOSIS — H35033 Hypertensive retinopathy, bilateral: Secondary | ICD-10-CM | POA: Diagnosis not present

## 2015-06-15 DIAGNOSIS — E11311 Type 2 diabetes mellitus with unspecified diabetic retinopathy with macular edema: Secondary | ICD-10-CM

## 2015-06-15 DIAGNOSIS — H43812 Vitreous degeneration, left eye: Secondary | ICD-10-CM | POA: Diagnosis not present

## 2015-06-15 DIAGNOSIS — I10 Essential (primary) hypertension: Secondary | ICD-10-CM

## 2015-07-13 ENCOUNTER — Ambulatory Visit: Payer: Medicare Other | Admitting: Internal Medicine

## 2015-07-13 ENCOUNTER — Ambulatory Visit (INDEPENDENT_AMBULATORY_CARE_PROVIDER_SITE_OTHER): Payer: Medicare Other | Admitting: *Deleted

## 2015-07-13 DIAGNOSIS — R55 Syncope and collapse: Secondary | ICD-10-CM | POA: Diagnosis not present

## 2015-07-13 NOTE — Progress Notes (Signed)
Carelink Summary Report / Loop Recorder 

## 2015-07-24 LAB — CUP PACEART REMOTE DEVICE CHECK: Date Time Interrogation Session: 20170210160948

## 2015-07-24 NOTE — Progress Notes (Signed)
Carelink summary report received. Battery status OK. Normal device function. No new symptom episodes, tachy episodes, brady, or pause episodes. No new AF episodes. Monthly summary reports and ROV/PRN 

## 2015-07-27 ENCOUNTER — Encounter (INDEPENDENT_AMBULATORY_CARE_PROVIDER_SITE_OTHER): Payer: Medicare Other | Admitting: Ophthalmology

## 2015-08-06 ENCOUNTER — Encounter: Payer: Self-pay | Admitting: Internal Medicine

## 2015-08-06 ENCOUNTER — Ambulatory Visit (INDEPENDENT_AMBULATORY_CARE_PROVIDER_SITE_OTHER): Payer: Medicare Other | Admitting: Internal Medicine

## 2015-08-06 ENCOUNTER — Other Ambulatory Visit: Payer: Self-pay | Admitting: Internal Medicine

## 2015-08-06 VITALS — BP 124/70 | HR 71 | Ht 66.0 in | Wt 147.0 lb

## 2015-08-06 DIAGNOSIS — J4530 Mild persistent asthma, uncomplicated: Secondary | ICD-10-CM

## 2015-08-06 DIAGNOSIS — R0600 Dyspnea, unspecified: Secondary | ICD-10-CM

## 2015-08-06 LAB — PULMONARY FUNCTION TEST
DL/VA % pred: 77 %
DL/VA: 3.35 ml/min/mmHg/L
DLCO cor % pred: 68 %
DLCO cor: 18.58 ml/min/mmHg
DLCO unc % pred: 65 %
DLCO unc: 17.62 ml/min/mmHg
FEF 25-75 Post: 2.02 L/sec
FEF 25-75 Pre: 1.62 L/sec
FEF2575-%Change-Post: 24 %
FEF2575-%Pred-Post: 97 %
FEF2575-%Pred-Pre: 78 %
FEV1-%Change-Post: 5 %
FEV1-%Pred-Post: 99 %
FEV1-%Pred-Pre: 94 %
FEV1-Post: 2.72 L
FEV1-Pre: 2.58 L
FEV1FVC-%Change-Post: 7 %
FEV1FVC-%Pred-Pre: 97 %
FEV6-%Change-Post: -1 %
FEV6-%Pred-Post: 100 %
FEV6-%Pred-Pre: 101 %
FEV6-Post: 3.54 L
FEV6-Pre: 3.58 L
FEV6FVC-%Change-Post: 0 %
FEV6FVC-%Pred-Post: 105 %
FEV6FVC-%Pred-Pre: 104 %
FVC-%Change-Post: -1 %
FVC-%Pred-Post: 95 %
FVC-%Pred-Pre: 96 %
FVC-Post: 3.57 L
FVC-Pre: 3.63 L
Post FEV1/FVC ratio: 76 %
Post FEV6/FVC ratio: 100 %
Pre FEV1/FVC ratio: 71 %
Pre FEV6/FVC Ratio: 99 %
RV % pred: 110 %
RV: 2.5 L
TLC % pred: 98 %
TLC: 6.14 L

## 2015-08-06 NOTE — Progress Notes (Signed)
PFT done today. 

## 2015-08-06 NOTE — Progress Notes (Signed)
Subjective:     Patient ID: Gerald Rosario, male   DOB: 01-28-1946,    MRN: [id number]    Brief patient profile:  39 yowm quit smoking 1982 with dx of asthma eval in 2011 in pulmonary clinic 2011 with pfts showing min non-specific mid flow flow reduction then and symptoms well controlled on dulera 100 2bid  And nl again 08/06/2015    History of Present Illness  05/24/2015 1st [place] [facility] office visit/ Dempsey Ahonen   Chief Complaint  Patient presents with  . Pulmonary Consult    Last seen in 2011. Pt states he started having chest congestion 1 month ago-cleared up after amoxicillin. He then started having increased SOB about 2 wks ago. He has minimal cough- prod with min clear sputum. Breathing is esp worse when he lies down. He is using combivent 2 puffs 6 x daily on average.   doing fine first of year 2017  while on advair 115 then stopped it when ran out around mid Jan 2017  and changed to combivent   Up to 2 pffs every 4 hours (confused with instructions per the [place] in [place])  and gradually losing ground with min prod cough and sob with exertion and at hs where has to sleep now in recliner - combivent was helping but not now rec Symbicort 80 Take 2 puffs first thing in am and then another 2 puffs about 12 hours later.  Only use your albuterol (combivent) as a rescue medication  Prednisone 10 mg take  4 each am x 2 days,   2 each am x 2 days,  1 each am x 2 days and stop     08/06/2015  f/u ov/Yoshimi Sarr re: asthma/ no evidence of copd maint rx sym 80 2bid per [facility] Chief Complaint  Patient presents with  . Follow-up    PFT done today. Breathing has returned to normal baseline for him. No new co's today.   able to sleep flat, ex up 40 min on elipical / no need for rescue  No obvious day to day or daytime variability or assoc excess/ purulent sputum or mucus plugs  or cp or chest tightness, subjective wheeze or overt sinus or hb symptoms. No unusual exp hx or h/o childhood pna/ asthma or knowledge  of premature birth.  Sleeping ok without nocturnal  or early am exacerbation  of respiratory  c/o's or need for noct saba. Also denies any obvious fluctuation of symptoms with weather or environmental changes or other aggravating or alleviating factors except as outlined above   Current Medications, Allergies, Complete Past Medical History, Past Surgical History, Family History, and Social History were reviewed in [employer] record.  ROS  The following are not active complaints unless bolded sore throat, dysphagia, dental problems, itching, sneezing,  nasal congestion or excess/ purulent secretions, ear ache,   fever, chills, sweats, unintended wt loss, classically pleuritic or exertional cp, hemoptysis,  orthopnea pnd or leg swelling, presyncope, palpitations, abdominal pain, anorexia, nausea, vomiting, diarrhea  or change in bowel or bladder habits, change in stools or urine, dysuria,hematuria,  rash, arthralgias, visual complaints, headache, numbness, weakness or ataxia or problems with walking or coordination,  change in mood/affect or memory.              Objective:   Physical Exam    amb slt hoarse wm nad  08/06/2015         147  05/24/15 146 lb (66.225 kg)  03/03/15 136 lb  9 oz (61.944 kg)  08/21/14 154 lb (69.854 kg)    Vital signs reviewed   HEENT: nl dentition, turbinates, and oropharynx. Nl external ear canals without cough reflex   NECK :  without JVD/Nodes/TM/ nl carotid upstrokes bilaterally   LUNGS: no acc muscle use,  Nl contour chest   / clear bilaterally   CV:  RRR  no s3 or murmur or increase in P2, no edema   ABD:  soft and nontender with nl inspiratory excursion in the supine position. No bruits or organomegaly, bowel sounds nl  MS:  Nl gait/ ext warm without deformities, calf tenderness, cyanosis or clubbing No obvious joint restrictions   SKIN: warm and dry without lesions    NEURO:  alert, approp, nl sensorium with  no motor  deficits        Assessment:

## 2015-08-06 NOTE — Patient Instructions (Signed)
Continue Symbicort 80 Take 2 puffs first thing in am and then another 2 puffs about 12 hours later.    If you are satisfied with your treatment plan,  let your doctor know and he/she can either refill your medications or you can return here when your prescription runs out.     If in any way you are not 100% satisfied,  please tell us.  If 100% better, tell your friends!  Pulmonary follow up is as needed

## 2015-08-06 NOTE — Assessment & Plan Note (Signed)
05/24/2015  extensive coaching HFA effectiveness =    75% > resume maint rx = symbicort 80 2bid  - PFT's  08/06/2015  FEV1 2.72 (99 % ) ratio 76  p 5 % improvement from saba p symb 80x2 am  prior to study with DLCO  65/68 % corrects to 77 % for alv volume    I had an extended final summary discussion with the patient reviewing all relevant studies completed to date and  lasting 15 to 20 minutes of a 25 minute visit on the following issues:    All goals of chronic asthma control met including optimal (nl) function and elimination of symptoms with minimal need for rescue therapy.  Contingencies discussed in full including contacting this office immediately if not controlling the symptoms using the rule of two's.     Each maintenance medication was reviewed in detail including most importantly the difference between maintenance and as needed and under what circumstances the prns are to be used.  Please see instructions for details which were reviewed in writing and the patient given a copy.    Pulmonary f/u is prn

## 2015-08-11 ENCOUNTER — Encounter (INDEPENDENT_AMBULATORY_CARE_PROVIDER_SITE_OTHER): Payer: Medicare Other | Admitting: Ophthalmology

## 2015-08-12 ENCOUNTER — Ambulatory Visit (INDEPENDENT_AMBULATORY_CARE_PROVIDER_SITE_OTHER): Payer: Medicare Other | Admitting: *Deleted

## 2015-08-12 DIAGNOSIS — N401 Enlarged prostate with lower urinary tract symptoms: Secondary | ICD-10-CM | POA: Diagnosis not present

## 2015-08-12 DIAGNOSIS — R55 Syncope and collapse: Secondary | ICD-10-CM

## 2015-08-12 DIAGNOSIS — N138 Other obstructive and reflux uropathy: Secondary | ICD-10-CM | POA: Diagnosis not present

## 2015-08-12 DIAGNOSIS — Z0000 Encounter for general adult medical examination without abnormal findings: Secondary | ICD-10-CM | POA: Diagnosis not present

## 2015-08-12 DIAGNOSIS — R338 Other retention of urine: Secondary | ICD-10-CM | POA: Diagnosis not present

## 2015-08-12 NOTE — Progress Notes (Signed)
Carelink Summary Report / Loop Recorder 

## 2015-08-19 ENCOUNTER — Encounter (INDEPENDENT_AMBULATORY_CARE_PROVIDER_SITE_OTHER): Payer: Medicare Other | Admitting: Ophthalmology

## 2015-08-19 DIAGNOSIS — H338 Other retinal detachments: Secondary | ICD-10-CM | POA: Diagnosis not present

## 2015-08-19 DIAGNOSIS — H35033 Hypertensive retinopathy, bilateral: Secondary | ICD-10-CM

## 2015-08-19 DIAGNOSIS — E113313 Type 2 diabetes mellitus with moderate nonproliferative diabetic retinopathy with macular edema, bilateral: Secondary | ICD-10-CM

## 2015-08-19 DIAGNOSIS — H43812 Vitreous degeneration, left eye: Secondary | ICD-10-CM | POA: Diagnosis not present

## 2015-08-19 DIAGNOSIS — E11311 Type 2 diabetes mellitus with unspecified diabetic retinopathy with macular edema: Secondary | ICD-10-CM | POA: Diagnosis not present

## 2015-08-19 DIAGNOSIS — I10 Essential (primary) hypertension: Secondary | ICD-10-CM | POA: Diagnosis not present

## 2015-08-23 ENCOUNTER — Ambulatory Visit (INDEPENDENT_AMBULATORY_CARE_PROVIDER_SITE_OTHER): Payer: Medicare Other | Admitting: Internal Medicine

## 2015-08-23 ENCOUNTER — Encounter: Payer: Self-pay | Admitting: Internal Medicine

## 2015-08-23 ENCOUNTER — Other Ambulatory Visit (INDEPENDENT_AMBULATORY_CARE_PROVIDER_SITE_OTHER): Payer: Medicare Other

## 2015-08-23 VITALS — BP 138/82 | HR 69 | Temp 97.9°F | Ht 67.0 in | Wt 150.0 lb

## 2015-08-23 DIAGNOSIS — E118 Type 2 diabetes mellitus with unspecified complications: Secondary | ICD-10-CM

## 2015-08-23 DIAGNOSIS — D509 Iron deficiency anemia, unspecified: Secondary | ICD-10-CM

## 2015-08-23 DIAGNOSIS — I2510 Atherosclerotic heart disease of native coronary artery without angina pectoris: Secondary | ICD-10-CM

## 2015-08-23 DIAGNOSIS — K219 Gastro-esophageal reflux disease without esophagitis: Secondary | ICD-10-CM | POA: Diagnosis not present

## 2015-08-23 DIAGNOSIS — I10 Essential (primary) hypertension: Secondary | ICD-10-CM

## 2015-08-23 LAB — CBC WITH DIFFERENTIAL/PLATELET
Basophils Absolute: 0 10*3/uL (ref 0.0–0.1)
Basophils Relative: 0.4 % (ref 0.0–3.0)
Eosinophils Absolute: 0.4 10*3/uL (ref 0.0–0.7)
Eosinophils Relative: 5.2 % — ABNORMAL HIGH (ref 0.0–5.0)
HCT: 37.2 % — ABNORMAL LOW (ref 39.0–52.0)
Hemoglobin: 12.4 g/dL — ABNORMAL LOW (ref 13.0–17.0)
Lymphocytes Relative: 18.9 % (ref 12.0–46.0)
Lymphs Abs: 1.3 10*3/uL (ref 0.7–4.0)
MCHC: 33.3 g/dL (ref 30.0–36.0)
MCV: 90.7 fl (ref 78.0–100.0)
Monocytes Absolute: 0.5 10*3/uL (ref 0.1–1.0)
Monocytes Relative: 7.1 % (ref 3.0–12.0)
Neutro Abs: 4.7 10*3/uL (ref 1.4–7.7)
Neutrophils Relative %: 68.4 % (ref 43.0–77.0)
Platelets: 295 10*3/uL (ref 150.0–400.0)
RBC: 4.1 Mil/uL — ABNORMAL LOW (ref 4.22–5.81)
RDW: 15.4 % (ref 11.5–15.5)
WBC: 6.8 10*3/uL (ref 4.0–10.5)

## 2015-08-23 LAB — MICROALBUMIN / CREATININE URINE RATIO
Creatinine,U: 65.8 mg/dL
Microalb Creat Ratio: 1.1 mg/g (ref 0.0–30.0)
Microalb, Ur: 0.7 mg/dL (ref 0.0–1.9)

## 2015-08-23 LAB — COMPREHENSIVE METABOLIC PANEL
ALT: 17 U/L (ref 0–53)
AST: 25 U/L (ref 0–37)
Albumin: 4.1 g/dL (ref 3.5–5.2)
Alkaline Phosphatase: 43 U/L (ref 39–117)
BUN: 12 mg/dL (ref 6–23)
CO2: 27 mEq/L (ref 19–32)
Calcium: 9.7 mg/dL (ref 8.4–10.5)
Chloride: 104 mEq/L (ref 96–112)
Creatinine, Ser: 1.07 mg/dL (ref 0.40–1.50)
GFR: 72.44 mL/min (ref >60.00)
Glucose, Bld: 130 mg/dL — ABNORMAL HIGH (ref 70–99)
Potassium: 5.1 mEq/L (ref 3.5–5.1)
Sodium: 137 mEq/L (ref 135–145)
Total Bilirubin: 0.6 mg/dL (ref 0.2–1.2)
Total Protein: 6.8 g/dL (ref 6.0–8.3)

## 2015-08-23 LAB — HEMOGLOBIN A1C: Hgb A1c MFr Bld: 7.3 % — ABNORMAL HIGH (ref 4.6–6.5)

## 2015-08-23 LAB — FERRITIN: Ferritin: 38.2 ng/mL (ref 22.0–322.0)

## 2015-08-23 LAB — TSH: TSH: 2.3 u[IU]/mL (ref 0.35–4.50)

## 2015-08-23 LAB — IRON: Iron: 87 ug/dL (ref 42–165)

## 2015-08-23 NOTE — Assessment & Plan Note (Signed)
History of iron deficiency anemia Check CBC, iron levels 

## 2015-08-23 NOTE — Patient Instructions (Addendum)

## 2015-08-23 NOTE — Assessment & Plan Note (Signed)
BP well controlled Current regimen effective and well tolerated Continue current medications at current doses cmp  
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
## 2015-08-23 NOTE — Progress Notes (Signed)
Pre visit review using our clinic review tool, if applicable. No additional management support is needed unless otherwise documented below in the visit note. 

## 2015-08-23 NOTE — Assessment & Plan Note (Signed)
GERD controlled Continue daily medication  

## 2015-08-23 NOTE — Assessment & Plan Note (Signed)
Check A1c, urine microalbumin Continue current medications Sugars are well controlled at home Continue healthy diet and regular exercise

## 2015-08-25 LAB — CUP PACEART REMOTE DEVICE CHECK: Date Time Interrogation Session: 20170312160710

## 2015-08-29 LAB — CUP PACEART REMOTE DEVICE CHECK: Date Time Interrogation Session: 20170411163614

## 2015-08-29 NOTE — Progress Notes (Signed)
Carelink summary report received. Battery status OK. Normal device function. No new symptom episodes, brady, or pause episodes. No new AF episodes.1 tachy episode, oversensing.  Monthly summary reports and ROV/PRN

## 2015-09-08 ENCOUNTER — Encounter: Payer: Self-pay | Admitting: Internal Medicine

## 2015-09-08 ENCOUNTER — Ambulatory Visit (INDEPENDENT_AMBULATORY_CARE_PROVIDER_SITE_OTHER): Payer: Medicare Other | Admitting: Internal Medicine

## 2015-09-08 VITALS — BP 108/62 | HR 64 | Ht 67.0 in | Wt 150.0 lb

## 2015-09-08 DIAGNOSIS — Z4509 Encounter for adjustment and management of other cardiac device: Secondary | ICD-10-CM | POA: Diagnosis not present

## 2015-09-08 DIAGNOSIS — I2510 Atherosclerotic heart disease of native coronary artery without angina pectoris: Secondary | ICD-10-CM | POA: Diagnosis not present

## 2015-09-08 NOTE — Patient Instructions (Signed)
Medication Instructions:  Your physician recommends that you continue on your current medications as directed. Please refer to the Current Medication list given to you today.   Labwork: None ordered   Testing/Procedures: None ordered   Follow-Up: Your physician wants you to follow-up in: 11 months with Dr Knox Saliva will receive a reminder letter in the mail two months in advance. If you don't receive a letter, please call our office to schedule the follow-up appointment.   Any Other Special Instructions Will Be Listed Below (If Applicable).     If you need a refill on your cardiac medications before your next appointment, please call your pharmacy.
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
## 2015-09-09 LAB — CUP PACEART INCLINIC DEVICE CHECK: Date Time Interrogation Session: 20170607141823

## 2015-09-13 ENCOUNTER — Ambulatory Visit (INDEPENDENT_AMBULATORY_CARE_PROVIDER_SITE_OTHER): Payer: Medicare Other | Admitting: *Deleted

## 2015-09-13 DIAGNOSIS — R55 Syncope and collapse: Secondary | ICD-10-CM | POA: Diagnosis not present

## 2015-09-13 NOTE — Progress Notes (Signed)
Carelink Summary Report / Loop Recorder 

## 2015-09-17 LAB — CUP PACEART REMOTE DEVICE CHECK
Date Time Interrogation Session: 20170511163924
Date Time Interrogation Session: 20170610171224

## 2015-09-30 ENCOUNTER — Encounter (INDEPENDENT_AMBULATORY_CARE_PROVIDER_SITE_OTHER): Payer: Medicare Other | Admitting: Ophthalmology

## 2015-09-30 DIAGNOSIS — H35033 Hypertensive retinopathy, bilateral: Secondary | ICD-10-CM | POA: Diagnosis not present

## 2015-09-30 DIAGNOSIS — E11311 Type 2 diabetes mellitus with unspecified diabetic retinopathy with macular edema: Secondary | ICD-10-CM

## 2015-09-30 DIAGNOSIS — E113313 Type 2 diabetes mellitus with moderate nonproliferative diabetic retinopathy with macular edema, bilateral: Secondary | ICD-10-CM

## 2015-09-30 DIAGNOSIS — H43812 Vitreous degeneration, left eye: Secondary | ICD-10-CM

## 2015-09-30 DIAGNOSIS — I10 Essential (primary) hypertension: Secondary | ICD-10-CM | POA: Diagnosis not present

## 2015-09-30 DIAGNOSIS — H338 Other retinal detachments: Secondary | ICD-10-CM | POA: Diagnosis not present

## 2015-10-11 ENCOUNTER — Ambulatory Visit (INDEPENDENT_AMBULATORY_CARE_PROVIDER_SITE_OTHER): Payer: Medicare Other | Admitting: *Deleted

## 2015-10-11 DIAGNOSIS — R55 Syncope and collapse: Secondary | ICD-10-CM | POA: Diagnosis not present

## 2015-10-11 NOTE — Progress Notes (Signed)
Carelink Summary Report / Loop Recorder 

## 2015-11-10 ENCOUNTER — Ambulatory Visit (INDEPENDENT_AMBULATORY_CARE_PROVIDER_SITE_OTHER): Payer: Medicare Other | Admitting: *Deleted

## 2015-11-10 DIAGNOSIS — R55 Syncope and collapse: Secondary | ICD-10-CM | POA: Diagnosis not present

## 2015-11-11 ENCOUNTER — Encounter (INDEPENDENT_AMBULATORY_CARE_PROVIDER_SITE_OTHER): Payer: Medicare Other | Admitting: Ophthalmology

## 2015-11-11 DIAGNOSIS — H35033 Hypertensive retinopathy, bilateral: Secondary | ICD-10-CM

## 2015-11-11 DIAGNOSIS — E113313 Type 2 diabetes mellitus with moderate nonproliferative diabetic retinopathy with macular edema, bilateral: Secondary | ICD-10-CM | POA: Diagnosis not present

## 2015-11-11 DIAGNOSIS — H338 Other retinal detachments: Secondary | ICD-10-CM | POA: Diagnosis not present

## 2015-11-11 DIAGNOSIS — I10 Essential (primary) hypertension: Secondary | ICD-10-CM | POA: Diagnosis not present

## 2015-11-11 DIAGNOSIS — H43812 Vitreous degeneration, left eye: Secondary | ICD-10-CM

## 2015-11-11 DIAGNOSIS — E11311 Type 2 diabetes mellitus with unspecified diabetic retinopathy with macular edema: Secondary | ICD-10-CM | POA: Diagnosis not present

## 2015-11-11 LAB — CUP PACEART REMOTE DEVICE CHECK: Date Time Interrogation Session: 20170710180724

## 2015-11-11 NOTE — Progress Notes (Signed)
Carelink Summary Report / Loop Recorder 

## 2015-12-01 LAB — CUP PACEART REMOTE DEVICE CHECK: Date Time Interrogation Session: 20170809181006

## 2015-12-01 NOTE — Progress Notes (Signed)
Carelink summary report received. Battery status OK. Normal device function. No new symptom episodes, brady, or pause episodes. No new AF episodes. 7 tachy- ECGs previously addressed. Monthly summary reports and ROV/PRN

## 2015-12-10 ENCOUNTER — Ambulatory Visit (INDEPENDENT_AMBULATORY_CARE_PROVIDER_SITE_OTHER): Payer: Medicare Other | Admitting: *Deleted

## 2015-12-10 DIAGNOSIS — R55 Syncope and collapse: Secondary | ICD-10-CM

## 2015-12-10 NOTE — Progress Notes (Signed)
Carelink Summary Report / Loop Recorder 

## 2015-12-23 ENCOUNTER — Encounter (INDEPENDENT_AMBULATORY_CARE_PROVIDER_SITE_OTHER): Payer: Medicare Other | Admitting: Ophthalmology

## 2015-12-30 ENCOUNTER — Encounter (INDEPENDENT_AMBULATORY_CARE_PROVIDER_SITE_OTHER): Payer: Medicare Other | Admitting: Ophthalmology

## 2015-12-30 DIAGNOSIS — H35033 Hypertensive retinopathy, bilateral: Secondary | ICD-10-CM

## 2015-12-30 DIAGNOSIS — E11311 Type 2 diabetes mellitus with unspecified diabetic retinopathy with macular edema: Secondary | ICD-10-CM | POA: Diagnosis not present

## 2015-12-30 DIAGNOSIS — H43812 Vitreous degeneration, left eye: Secondary | ICD-10-CM

## 2015-12-30 DIAGNOSIS — H338 Other retinal detachments: Secondary | ICD-10-CM

## 2015-12-30 DIAGNOSIS — I10 Essential (primary) hypertension: Secondary | ICD-10-CM | POA: Diagnosis not present

## 2015-12-30 DIAGNOSIS — E113313 Type 2 diabetes mellitus with moderate nonproliferative diabetic retinopathy with macular edema, bilateral: Secondary | ICD-10-CM | POA: Diagnosis not present

## 2016-01-08 LAB — CUP PACEART REMOTE DEVICE CHECK: Date Time Interrogation Session: 20170908183844

## 2016-01-08 NOTE — Progress Notes (Signed)
Carelink summary report received. Battery status OK. Normal device function. No new symptom episodes, brady, or pause episodes. No new AF episodes. 6 tachy episodes, oversensing. Monthly summary reports and ROV/PRN

## 2016-01-10 ENCOUNTER — Ambulatory Visit (INDEPENDENT_AMBULATORY_CARE_PROVIDER_SITE_OTHER): Payer: Medicare Other | Admitting: *Deleted

## 2016-01-10 DIAGNOSIS — R55 Syncope and collapse: Secondary | ICD-10-CM | POA: Diagnosis not present

## 2016-01-10 NOTE — Progress Notes (Signed)
Carelink Summary Report / Loop Recorder 

## 2016-01-18 ENCOUNTER — Other Ambulatory Visit: Payer: Self-pay | Admitting: *Deleted

## 2016-01-18 MED ORDER — GLUCOSE BLOOD VI STRP: 1.0000 | ORAL_STRIP | Freq: Three times a day (TID) | 5 refills | Status: DC

## 2016-01-18 MED ORDER — ACCU-CHEK FASTCLIX LANCETS MISC: 5 refills | Status: DC

## 2016-01-20 ENCOUNTER — Telehealth: Payer: Self-pay | Admitting: Cardiology

## 2016-01-20 NOTE — Telephone Encounter (Signed)
Spoke w/ pt and requested that he send a manual transmission b/c his home monitor has not updated in at least 14 days.   

## 2016-02-08 ENCOUNTER — Ambulatory Visit (INDEPENDENT_AMBULATORY_CARE_PROVIDER_SITE_OTHER): Payer: Medicare Other | Admitting: *Deleted

## 2016-02-08 DIAGNOSIS — R55 Syncope and collapse: Secondary | ICD-10-CM

## 2016-02-09 NOTE — Progress Notes (Signed)
Carelink Summary Report / Loop Recorder 

## 2016-02-13 LAB — CUP PACEART REMOTE DEVICE CHECK
Date Time Interrogation Session: 20171008204115
Implantable Pulse Generator Implant Date: 20150520

## 2016-02-13 NOTE — Progress Notes (Signed)
Carelink summary report received. Battery status OK. Normal device function. No new symptom episodes, brady, or pause episodes. No new AF episodes. 6 tachy episodes, oversensing. Monthly summary reports and ROV/PRN

## 2016-02-15 ENCOUNTER — Encounter (INDEPENDENT_AMBULATORY_CARE_PROVIDER_SITE_OTHER): Payer: Medicare Other | Admitting: Ophthalmology

## 2016-02-15 DIAGNOSIS — H43812 Vitreous degeneration, left eye: Secondary | ICD-10-CM | POA: Diagnosis not present

## 2016-02-15 DIAGNOSIS — E113391 Type 2 diabetes mellitus with moderate nonproliferative diabetic retinopathy without macular edema, right eye: Secondary | ICD-10-CM

## 2016-02-15 DIAGNOSIS — E11311 Type 2 diabetes mellitus with unspecified diabetic retinopathy with macular edema: Secondary | ICD-10-CM

## 2016-02-15 DIAGNOSIS — I10 Essential (primary) hypertension: Secondary | ICD-10-CM

## 2016-02-15 DIAGNOSIS — H338 Other retinal detachments: Secondary | ICD-10-CM

## 2016-02-15 DIAGNOSIS — E113312 Type 2 diabetes mellitus with moderate nonproliferative diabetic retinopathy with macular edema, left eye: Secondary | ICD-10-CM

## 2016-02-15 DIAGNOSIS — H35033 Hypertensive retinopathy, bilateral: Secondary | ICD-10-CM

## 2016-02-15 LAB — HM DIABETES EYE EXAM

## 2016-03-02 ENCOUNTER — Ambulatory Visit: Payer: Medicare Other | Admitting: Internal Medicine

## 2016-03-09 ENCOUNTER — Ambulatory Visit (INDEPENDENT_AMBULATORY_CARE_PROVIDER_SITE_OTHER): Payer: Medicare Other | Admitting: Internal Medicine

## 2016-03-09 ENCOUNTER — Ambulatory Visit (INDEPENDENT_AMBULATORY_CARE_PROVIDER_SITE_OTHER): Payer: Medicare Other | Admitting: *Deleted

## 2016-03-09 ENCOUNTER — Encounter: Payer: Self-pay | Admitting: Internal Medicine

## 2016-03-09 VITALS — BP 150/70 | HR 71 | Temp 97.9°F | Resp 16 | Wt 153.0 lb

## 2016-03-09 DIAGNOSIS — I2583 Coronary atherosclerosis due to lipid rich plaque: Secondary | ICD-10-CM | POA: Diagnosis not present

## 2016-03-09 DIAGNOSIS — R55 Syncope and collapse: Secondary | ICD-10-CM

## 2016-03-09 DIAGNOSIS — I10 Essential (primary) hypertension: Secondary | ICD-10-CM

## 2016-03-09 DIAGNOSIS — E118 Type 2 diabetes mellitus with unspecified complications: Secondary | ICD-10-CM | POA: Diagnosis not present

## 2016-03-09 DIAGNOSIS — I2510 Atherosclerotic heart disease of native coronary artery without angina pectoris: Secondary | ICD-10-CM

## 2016-03-09 DIAGNOSIS — E7800 Pure hypercholesterolemia, unspecified: Secondary | ICD-10-CM

## 2016-03-09 DIAGNOSIS — M79671 Pain in right foot: Secondary | ICD-10-CM

## 2016-03-09 DIAGNOSIS — G8929 Other chronic pain: Secondary | ICD-10-CM | POA: Insufficient documentation

## 2016-03-09 DIAGNOSIS — K219 Gastro-esophageal reflux disease without esophagitis: Secondary | ICD-10-CM

## 2016-03-09 DIAGNOSIS — I480 Paroxysmal atrial fibrillation: Secondary | ICD-10-CM | POA: Diagnosis not present

## 2016-03-09 DIAGNOSIS — J4530 Mild persistent asthma, uncomplicated: Secondary | ICD-10-CM

## 2016-03-09 NOTE — Assessment & Plan Note (Signed)
Related to a heel spur - has gotten larger Wants to have it removed  Will refer to ortho

## 2016-03-09 NOTE — Assessment & Plan Note (Signed)
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

## 2016-03-09 NOTE — Assessment & Plan Note (Signed)
No chest pain Exercising regularly Continue statin, asa Monitor BP

## 2016-03-09 NOTE — Assessment & Plan Note (Signed)
Sugars slightly elevated at times, but has also had low sugars Taking a supplement that is helping - trying to figure out best time to take it Also take glipizide Discussed concern with low sugars - he monitors closely a1c today

## 2016-03-09 NOTE — Assessment & Plan Note (Signed)
In sinus today Rate controlled continue current medications

## 2016-03-09 NOTE — Assessment & Plan Note (Signed)
gerd controlled Try to decreased protonix to 40 mg QOD - if controlled will try decreasing to 20 mg

## 2016-03-09 NOTE — Patient Instructions (Signed)

## 2016-03-09 NOTE — Assessment & Plan Note (Signed)
Continue statin. 

## 2016-03-09 NOTE — Assessment & Plan Note (Signed)
Controlled Taking symbicort daily and uses rescue inhaler only as needed
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
## 2016-03-10 ENCOUNTER — Other Ambulatory Visit (INDEPENDENT_AMBULATORY_CARE_PROVIDER_SITE_OTHER): Payer: Medicare Other

## 2016-03-10 DIAGNOSIS — I2583 Coronary atherosclerosis due to lipid rich plaque: Secondary | ICD-10-CM | POA: Diagnosis not present

## 2016-03-10 DIAGNOSIS — I2510 Atherosclerotic heart disease of native coronary artery without angina pectoris: Secondary | ICD-10-CM

## 2016-03-10 DIAGNOSIS — E118 Type 2 diabetes mellitus with unspecified complications: Secondary | ICD-10-CM

## 2016-03-10 DIAGNOSIS — I10 Essential (primary) hypertension: Secondary | ICD-10-CM

## 2016-03-10 LAB — COMPREHENSIVE METABOLIC PANEL
ALT: 17 U/L (ref 0–53)
AST: 25 U/L (ref 0–37)
Albumin: 4 g/dL (ref 3.5–5.2)
Alkaline Phosphatase: 58 U/L (ref 39–117)
BUN: 15 mg/dL (ref 6–23)
CO2: 27 mEq/L (ref 19–32)
Calcium: 9.3 mg/dL (ref 8.4–10.5)
Chloride: 108 mEq/L (ref 96–112)
Creatinine, Ser: 1.12 mg/dL (ref 0.40–1.50)
GFR: 68.61 mL/min (ref >60.00)
Glucose, Bld: 108 mg/dL — ABNORMAL HIGH (ref 70–99)
Potassium: 4.9 mEq/L (ref 3.5–5.1)
Sodium: 142 mEq/L (ref 135–145)
Total Bilirubin: 0.7 mg/dL (ref 0.2–1.2)
Total Protein: 7 g/dL (ref 6.0–8.3)

## 2016-03-10 LAB — HEMOGLOBIN A1C: Hgb A1c MFr Bld: 5.9 % (ref 4.6–6.5)

## 2016-03-10 NOTE — Progress Notes (Signed)
Carelink Summary Report / Loop Recorder 

## 2016-03-13 ENCOUNTER — Telehealth: Payer: Self-pay | Admitting: Internal Medicine

## 2016-03-13 DIAGNOSIS — E118 Type 2 diabetes mellitus with unspecified complications: Secondary | ICD-10-CM

## 2016-03-13 NOTE — Telephone Encounter (Signed)
Patient called back in.  Gave him MD response on lab results.  Please follow up with patient.  Patient states he should have gotten cholesterol lab done.

## 2016-03-15 NOTE — Telephone Encounter (Signed)
Please advise if pt need a lipid panel? I do not see that one was done and he would like one done.

## 2016-03-15 NOTE — Telephone Encounter (Signed)
Patient is calling about this again. Can you follow up, thank you.

## 2016-03-15 NOTE — Telephone Encounter (Signed)
Ok to order lipid panel °

## 2016-03-15 NOTE — Telephone Encounter (Signed)
Pt has been notified that ordered were entered.

## 2016-03-16 ENCOUNTER — Other Ambulatory Visit (INDEPENDENT_AMBULATORY_CARE_PROVIDER_SITE_OTHER): Payer: Medicare Other

## 2016-03-16 DIAGNOSIS — E118 Type 2 diabetes mellitus with unspecified complications: Secondary | ICD-10-CM

## 2016-03-16 LAB — LIPID PANEL
Cholesterol: 124 mg/dL (ref 0–200)
HDL: 64.2 mg/dL (ref >39.00)
LDL Cholesterol: 50 mg/dL (ref 0–99)
NonHDL: 59.89
Total CHOL/HDL Ratio: 2
Triglycerides: 47 mg/dL (ref 0.0–149.0)
VLDL: 9.4 mg/dL (ref 0.0–40.0)

## 2016-03-17 ENCOUNTER — Encounter: Payer: Self-pay | Admitting: Emergency Medicine

## 2016-03-24 ENCOUNTER — Telehealth: Payer: Self-pay | Admitting: Emergency Medicine

## 2016-03-24 NOTE — Telephone Encounter (Signed)
Tried contacting pt, unable to LVM. Will try again at another time.

## 2016-03-24 NOTE — Telephone Encounter (Signed)
-----   Message from Binnie Rail, MD sent at 03/16/2016  6:19 PM EST ----- Can you call him --- he wanted to know who did minimally invasive foot surgery.  I am having a hard time finding out exactly how these two doctors do their surgeries -- I think he should call them and try to find out and let us know if he wants to see either.   Dr. Paulla Dolly of podiatry or Dr. Doran Durand of [facility].    Thank you!  ----- Message ----- From: Binnie Rail, MD Sent: 03/09/2016   5:40 PM To: Binnie Rail, MD  Needs referral to ortho foot for r heel spur - wants laser surgery - minimally invasive / quick recovery

## 2016-03-25 LAB — CUP PACEART REMOTE DEVICE CHECK
Date Time Interrogation Session: 20171107214038
Implantable Pulse Generator Implant Date: 20150520

## 2016-03-25 NOTE — Progress Notes (Signed)
Carelink summary report received. Battery status OK. Normal device function. No new symptom episodes, brady, or pause episodes. 1 new AF-appears intermittent TWOS, not true AF. 6 tachy- available ECGs appear TWOS. Monthly summary reports and ROV/PRN

## 2016-04-10 ENCOUNTER — Ambulatory Visit (INDEPENDENT_AMBULATORY_CARE_PROVIDER_SITE_OTHER): Payer: Medicare Other | Admitting: *Deleted

## 2016-04-10 DIAGNOSIS — R55 Syncope and collapse: Secondary | ICD-10-CM

## 2016-04-11 NOTE — Progress Notes (Signed)
Carelink Summary Report / Loop Recorder 

## 2016-04-21 ENCOUNTER — Telehealth: Payer: Self-pay | Admitting: *Deleted

## 2016-04-21 NOTE — Telephone Encounter (Signed)
Manual transmission received.  All AF and tachy alerts false--ECGs show sinus rhythm with TWOS.  No other alerts or abnormalities.

## 2016-04-21 NOTE — Telephone Encounter (Signed)
Spoke with patient, requested manual Carelink transmission for review.  Patient verbalizes understanding.  Walked patient through manual transmission process.  He is appreciative of call and denies additional questions or concerns at this time.

## 2016-04-27 LAB — CUP PACEART REMOTE DEVICE CHECK
Date Time Interrogation Session: 20171207224054
Implantable Pulse Generator Implant Date: 20150520

## 2016-04-27 NOTE — Progress Notes (Signed)
[id number] 

## 2016-05-08 ENCOUNTER — Ambulatory Visit (INDEPENDENT_AMBULATORY_CARE_PROVIDER_SITE_OTHER): Payer: Medicare Other | Admitting: *Deleted

## 2016-05-08 DIAGNOSIS — R55 Syncope and collapse: Secondary | ICD-10-CM | POA: Diagnosis not present

## 2016-05-09 NOTE — Progress Notes (Signed)
Carelink Summary Report / Loop Recorder 

## 2016-05-18 ENCOUNTER — Encounter (INDEPENDENT_AMBULATORY_CARE_PROVIDER_SITE_OTHER): Payer: Medicare Other | Admitting: Ophthalmology

## 2016-05-18 DIAGNOSIS — H35033 Hypertensive retinopathy, bilateral: Secondary | ICD-10-CM

## 2016-05-18 DIAGNOSIS — H43813 Vitreous degeneration, bilateral: Secondary | ICD-10-CM | POA: Diagnosis not present

## 2016-05-18 DIAGNOSIS — E113313 Type 2 diabetes mellitus with moderate nonproliferative diabetic retinopathy with macular edema, bilateral: Secondary | ICD-10-CM | POA: Diagnosis not present

## 2016-05-18 DIAGNOSIS — E11311 Type 2 diabetes mellitus with unspecified diabetic retinopathy with macular edema: Secondary | ICD-10-CM

## 2016-05-18 DIAGNOSIS — H338 Other retinal detachments: Secondary | ICD-10-CM | POA: Diagnosis not present

## 2016-05-18 DIAGNOSIS — I10 Essential (primary) hypertension: Secondary | ICD-10-CM | POA: Diagnosis not present

## 2016-05-23 LAB — CUP PACEART REMOTE DEVICE CHECK
Date Time Interrogation Session: 20180106224335
Implantable Pulse Generator Implant Date: 20150520

## 2016-06-04 LAB — CUP PACEART REMOTE DEVICE CHECK
Date Time Interrogation Session: 20180205235604
Implantable Pulse Generator Implant Date: 20150520

## 2016-06-04 NOTE — Progress Notes (Signed)
Carelink summary report received. Battery status OK. Normal device function. No new symptom episodes, brady, or pause episodes.  5 tachy- avail. ECGs appear SR w/ vent. oversensing/TWOS. 4 AF 0.5%- 3 w/ ECGs available appear SR w/ undersensing/oversensing. Monthly summary reports and ROV/PRN

## 2016-06-07 ENCOUNTER — Ambulatory Visit (INDEPENDENT_AMBULATORY_CARE_PROVIDER_SITE_OTHER): Payer: Medicare Other | Admitting: *Deleted

## 2016-06-07 DIAGNOSIS — R55 Syncope and collapse: Secondary | ICD-10-CM

## 2016-06-08 NOTE — Progress Notes (Signed)
Carelink Summary Report / Loop Recorder 

## 2016-06-26 LAB — CUP PACEART REMOTE DEVICE CHECK
Date Time Interrogation Session: 20180307230828
Implantable Pulse Generator Implant Date: 20150520

## 2016-07-07 ENCOUNTER — Ambulatory Visit (INDEPENDENT_AMBULATORY_CARE_PROVIDER_SITE_OTHER): Payer: Medicare Other | Admitting: *Deleted

## 2016-07-07 DIAGNOSIS — R55 Syncope and collapse: Secondary | ICD-10-CM | POA: Diagnosis not present

## 2016-07-10 NOTE — Progress Notes (Signed)
Carelink Summary Report / Loop Recorder 

## 2016-07-16 LAB — CUP PACEART REMOTE DEVICE CHECK
Date Time Interrogation Session: 20180406234023
Implantable Pulse Generator Implant Date: 20150520

## 2016-07-16 NOTE — Progress Notes (Signed)
Carelink summary report received. Battery status OK. Normal device function. No new symptom episodes, brady, or pause episodes. No new AF episodes. 4 tachy- 2 w/ ECGs appear intermittent TWOS, PVCs/PAC noted. Monthly summary reports and ROV/PRN

## 2016-08-03 ENCOUNTER — Telehealth: Payer: Self-pay | Admitting: *Deleted

## 2016-08-03 NOTE — Telephone Encounter (Signed)
Attempted to reach patient at both home and cell numbers--no answer, no VM set up.  Will request that patient send a manual Carelink transmission for review of any episodes when able to reach patient.

## 2016-08-07 ENCOUNTER — Ambulatory Visit (INDEPENDENT_AMBULATORY_CARE_PROVIDER_SITE_OTHER): Payer: Medicare Other | Admitting: *Deleted

## 2016-08-07 DIAGNOSIS — R55 Syncope and collapse: Secondary | ICD-10-CM | POA: Diagnosis not present

## 2016-08-08 NOTE — Progress Notes (Signed)
Carelink Summary Report / Loop Recorder 

## 2016-08-15 ENCOUNTER — Encounter (INDEPENDENT_AMBULATORY_CARE_PROVIDER_SITE_OTHER): Payer: Medicare Other | Admitting: Ophthalmology

## 2016-08-17 NOTE — Telephone Encounter (Signed)
Manual transmission successfully received 08/04/16.

## 2016-08-18 LAB — CUP PACEART REMOTE DEVICE CHECK
Date Time Interrogation Session: 20180507000900
Implantable Pulse Generator Implant Date: 20150520

## 2016-08-24 ENCOUNTER — Encounter (INDEPENDENT_AMBULATORY_CARE_PROVIDER_SITE_OTHER): Payer: Medicare Other | Admitting: Ophthalmology

## 2016-08-24 DIAGNOSIS — H338 Other retinal detachments: Secondary | ICD-10-CM | POA: Diagnosis not present

## 2016-08-24 DIAGNOSIS — H35033 Hypertensive retinopathy, bilateral: Secondary | ICD-10-CM

## 2016-08-24 DIAGNOSIS — E113393 Type 2 diabetes mellitus with moderate nonproliferative diabetic retinopathy without macular edema, bilateral: Secondary | ICD-10-CM | POA: Diagnosis not present

## 2016-08-24 DIAGNOSIS — H43812 Vitreous degeneration, left eye: Secondary | ICD-10-CM | POA: Diagnosis not present

## 2016-08-24 DIAGNOSIS — E11319 Type 2 diabetes mellitus with unspecified diabetic retinopathy without macular edema: Secondary | ICD-10-CM | POA: Diagnosis not present

## 2016-08-24 DIAGNOSIS — I10 Essential (primary) hypertension: Secondary | ICD-10-CM | POA: Diagnosis not present
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
## 2016-09-04 NOTE — Patient Instructions (Addendum)

## 2016-09-05 ENCOUNTER — Ambulatory Visit (INDEPENDENT_AMBULATORY_CARE_PROVIDER_SITE_OTHER): Payer: Medicare Other | Admitting: Internal Medicine

## 2016-09-05 ENCOUNTER — Ambulatory Visit (INDEPENDENT_AMBULATORY_CARE_PROVIDER_SITE_OTHER): Payer: Medicare Other | Admitting: *Deleted

## 2016-09-05 ENCOUNTER — Encounter: Payer: Self-pay | Admitting: Internal Medicine

## 2016-09-05 ENCOUNTER — Other Ambulatory Visit (INDEPENDENT_AMBULATORY_CARE_PROVIDER_SITE_OTHER): Payer: Medicare Other

## 2016-09-05 VITALS — BP 140/74 | HR 67 | Temp 97.8°F | Resp 16 | Wt 148.0 lb

## 2016-09-05 DIAGNOSIS — K219 Gastro-esophageal reflux disease without esophagitis: Secondary | ICD-10-CM

## 2016-09-05 DIAGNOSIS — E118 Type 2 diabetes mellitus with unspecified complications: Secondary | ICD-10-CM

## 2016-09-05 DIAGNOSIS — I480 Paroxysmal atrial fibrillation: Secondary | ICD-10-CM

## 2016-09-05 DIAGNOSIS — I10 Essential (primary) hypertension: Secondary | ICD-10-CM

## 2016-09-05 DIAGNOSIS — I2510 Atherosclerotic heart disease of native coronary artery without angina pectoris: Secondary | ICD-10-CM | POA: Diagnosis not present

## 2016-09-05 DIAGNOSIS — I2583 Coronary atherosclerosis due to lipid rich plaque: Secondary | ICD-10-CM | POA: Diagnosis not present

## 2016-09-05 DIAGNOSIS — R55 Syncope and collapse: Secondary | ICD-10-CM | POA: Diagnosis not present

## 2016-09-05 DIAGNOSIS — E7800 Pure hypercholesterolemia, unspecified: Secondary | ICD-10-CM

## 2016-09-05 LAB — COMPREHENSIVE METABOLIC PANEL
ALT: 13 U/L (ref 0–53)
AST: 20 U/L (ref 0–37)
Albumin: 4.4 g/dL (ref 3.5–5.2)
Alkaline Phosphatase: 61 U/L (ref 39–117)
BUN: 19 mg/dL (ref 6–23)
CO2: 24 mEq/L (ref 19–32)
Calcium: 9.7 mg/dL (ref 8.4–10.5)
Chloride: 103 mEq/L (ref 96–112)
Creatinine, Ser: 1.34 mg/dL (ref 0.40–1.50)
GFR: 55.71 mL/min — ABNORMAL LOW (ref >60.00)
Glucose, Bld: 119 mg/dL — ABNORMAL HIGH (ref 70–99)
Potassium: 4.3 mEq/L (ref 3.5–5.1)
Sodium: 135 mEq/L (ref 135–145)
Total Bilirubin: 0.6 mg/dL (ref 0.2–1.2)
Total Protein: 7.4 g/dL (ref 6.0–8.3)

## 2016-09-05 LAB — LIPID PANEL
Cholesterol: 117 mg/dL (ref 0–200)
HDL: 46.4 mg/dL (ref >39.00)
LDL Cholesterol: 54 mg/dL (ref 0–99)
NonHDL: 70.54
Total CHOL/HDL Ratio: 3
Triglycerides: 81 mg/dL (ref 0.0–149.0)
VLDL: 16.2 mg/dL (ref 0.0–40.0)

## 2016-09-05 LAB — HEMOGLOBIN A1C: Hgb A1c MFr Bld: 6.6 % — ABNORMAL HIGH (ref 4.6–6.5)

## 2016-09-05 LAB — CBC WITH DIFFERENTIAL/PLATELET
Basophils Absolute: 0 10*3/uL (ref 0.0–0.1)
Basophils Relative: 0.6 % (ref 0.0–3.0)
Eosinophils Absolute: 0.7 10*3/uL (ref 0.0–0.7)
Eosinophils Relative: 10.8 % — ABNORMAL HIGH (ref 0.0–5.0)
HCT: 38.3 % — ABNORMAL LOW (ref 39.0–52.0)
Hemoglobin: 12.7 g/dL — ABNORMAL LOW (ref 13.0–17.0)
Lymphocytes Relative: 33.5 % (ref 12.0–46.0)
Lymphs Abs: 2.1 10*3/uL (ref 0.7–4.0)
MCHC: 33.2 g/dL (ref 30.0–36.0)
MCV: 90.2 fl (ref 78.0–100.0)
Monocytes Absolute: 0.6 10*3/uL (ref 0.1–1.0)
Monocytes Relative: 9.5 % (ref 3.0–12.0)
Neutro Abs: 2.8 10*3/uL (ref 1.4–7.7)
Neutrophils Relative %: 45.6 % (ref 43.0–77.0)
Platelets: 307 10*3/uL (ref 150.0–400.0)
RBC: 4.25 Mil/uL (ref 4.22–5.81)
RDW: 14.1 % (ref 11.5–15.5)
WBC: 6.2 10*3/uL (ref 4.0–10.5)

## 2016-09-05 LAB — TSH: TSH: 6.32 u[IU]/mL — ABNORMAL HIGH (ref 0.35–4.50)

## 2016-09-05 NOTE — Assessment & Plan Note (Signed)
Continue statin. 

## 2016-09-05 NOTE — Assessment & Plan Note (Signed)
Rate controlled Asymptomatic Has loop recorder Following with cardio

## 2016-09-05 NOTE — Assessment & Plan Note (Signed)
No chest pain, palps, sob following with cardio Continue ASA, statin

## 2016-09-05 NOTE — Assessment & Plan Note (Signed)
BP well controlled Current regimen effective and well tolerated Continue current medications at current doses  

## 2016-09-05 NOTE — Assessment & Plan Note (Signed)
Taper off protonix - now taking 20 mg 2/week - will eventually stop medication completely

## 2016-09-05 NOTE — Assessment & Plan Note (Signed)
Sugars are well controlled at home Continue glipizide Exercising regularly and eating healthy a1c

## 2016-09-06 NOTE — Progress Notes (Signed)
Carelink Summary Report / Loop Recorder 

## 2016-09-11 ENCOUNTER — Telehealth: Payer: Self-pay | Admitting: Internal Medicine

## 2016-09-11 LAB — CUP PACEART REMOTE DEVICE CHECK
Date Time Interrogation Session: 20180606000646
Implantable Pulse Generator Implant Date: 20150520

## 2016-09-11 NOTE — Telephone Encounter (Signed)
Patient called back.  Gave MD response on labs.  Patient will return in two months to lab for TSH.

## 2016-09-11 NOTE — Progress Notes (Signed)
Carelink summary report received. Battery status OK. Normal device function. No new symptom episodes, tachy episodes, brady, or pause episodes. No new AF episodes. Monthly summary reports and ROV/PRN 

## 2016-09-13 NOTE — Telephone Encounter (Signed)
Noted in results notes.

## 2016-09-14 ENCOUNTER — Encounter: Payer: Self-pay | Admitting: Internal Medicine

## 2016-09-14 ENCOUNTER — Ambulatory Visit (INDEPENDENT_AMBULATORY_CARE_PROVIDER_SITE_OTHER): Payer: Medicare Other | Admitting: Internal Medicine

## 2016-09-14 VITALS — BP 114/60 | HR 66 | Ht 67.0 in | Wt 147.0 lb

## 2016-09-14 DIAGNOSIS — R55 Syncope and collapse: Secondary | ICD-10-CM | POA: Diagnosis not present

## 2016-09-14 DIAGNOSIS — I2583 Coronary atherosclerosis due to lipid rich plaque: Secondary | ICD-10-CM

## 2016-09-14 DIAGNOSIS — I2510 Atherosclerotic heart disease of native coronary artery without angina pectoris: Secondary | ICD-10-CM

## 2016-09-14 LAB — CUP PACEART INCLINIC DEVICE CHECK
Date Time Interrogation Session: 20180614103815
Implantable Pulse Generator Implant Date: 20150520
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
## 2016-09-14 NOTE — Patient Instructions (Addendum)
Medication Instructions:  Your physician recommends that you continue on your current medications as directed. Please refer to the Current Medication list given to you today.   Labwork: None Ordered   Testing/Procedures: LINQ Removal    Follow-Up: 7-14 days after explant for wound check in Device Clinic and with Dr. Lovena Le as needed   Any Other Special Instructions Will Be Listed Below (If Applicable). Report to the [employer] [facility] on 09/26/16 at 6:30 AM  You may eat and drink normally prior to procedure  You may take your medications  You may drive yourself home after the procedrue    If you need a refill on your cardiac medications before your next appointment, please call your pharmacy.

## 2016-09-19 ENCOUNTER — Other Ambulatory Visit: Payer: Self-pay | Admitting: Internal Medicine

## 2016-09-19 NOTE — Telephone Encounter (Signed)
Pt called to cancel loop explant on 09/26/16. Pt stated he has jury duty the day prior, and is not sure if he will be available on 09/26/16. Requested pt to call our office to reschedule.   Contacted cath lab today to cancel procedure, per pt request.

## 2016-09-26 ENCOUNTER — Ambulatory Visit (HOSPITAL_COMMUNITY): Admit: 2016-09-26 | Payer: Medicare Other | Admitting: Internal Medicine

## 2016-09-26 ENCOUNTER — Encounter (HOSPITAL_COMMUNITY): Payer: Self-pay

## 2016-09-26 SURGERY — LOOP RECORDER REMOVAL

## 2016-09-29 ENCOUNTER — Telehealth: Payer: Self-pay | Admitting: Internal Medicine

## 2016-09-29 NOTE — Telephone Encounter (Signed)
New message    Pt is calling asking for a call back from RN about rescheduling his procedure. Please call.

## 2016-10-02 DIAGNOSIS — N401 Enlarged prostate with lower urinary tract symptoms: Secondary | ICD-10-CM | POA: Diagnosis not present

## 2016-10-02 DIAGNOSIS — R338 Other retention of urine: Secondary | ICD-10-CM | POA: Diagnosis not present

## 2016-10-03 NOTE — Telephone Encounter (Signed)
Called, pt unavailable. Unable to leave message.

## 2016-10-05 NOTE — Telephone Encounter (Signed)
Received incoming call. Pt would like to reschedule LINQ explant for 10/11/16. Informed I will contact the hospital and call pt back to confirm date and time. Pt verbalized understanding.

## 2016-10-05 NOTE — Telephone Encounter (Signed)
Called, spoke with pt. Informed LINQ explant scheduled for 7/11/8 at 8:30 AM, pt arriving to the hospital at 7:30 AM. Reviewed pre-procedure instructions. Informed pt will need to come to our office 7-10 after explant for a wound check in [facility]. Pt verbalized understanding.

## 2016-10-09 ENCOUNTER — Ambulatory Visit: Payer: Medicare Other

## 2016-10-11 ENCOUNTER — Ambulatory Visit (HOSPITAL_COMMUNITY)
Admission: RE | Admit: 2016-10-11 | Discharge: 2016-10-11 | Disposition: A | Discharge status: Home / Self Care | Payer: Medicare Other | Source: Ambulatory Visit | Attending: Internal Medicine | Admitting: Internal Medicine

## 2016-10-11 ENCOUNTER — Encounter (HOSPITAL_COMMUNITY)
Admission: RE | Disposition: A | Discharge status: Home / Self Care | Payer: Self-pay | Source: Ambulatory Visit | Attending: Internal Medicine

## 2016-10-11 DIAGNOSIS — I480 Paroxysmal atrial fibrillation: Secondary | ICD-10-CM | POA: Diagnosis not present

## 2016-10-11 DIAGNOSIS — Z951 Presence of aortocoronary bypass graft: Secondary | ICD-10-CM | POA: Insufficient documentation

## 2016-10-11 DIAGNOSIS — I5022 Chronic systolic (congestive) heart failure: Secondary | ICD-10-CM | POA: Diagnosis not present

## 2016-10-11 DIAGNOSIS — J45909 Unspecified asthma, uncomplicated: Secondary | ICD-10-CM | POA: Diagnosis not present

## 2016-10-11 DIAGNOSIS — Z4509 Encounter for adjustment and management of other cardiac device: Secondary | ICD-10-CM | POA: Insufficient documentation

## 2016-10-11 DIAGNOSIS — Z955 Presence of coronary angioplasty implant and graft: Secondary | ICD-10-CM | POA: Insufficient documentation

## 2016-10-11 DIAGNOSIS — N189 Chronic kidney disease, unspecified: Secondary | ICD-10-CM | POA: Diagnosis not present

## 2016-10-11 DIAGNOSIS — Z7982 Long term (current) use of aspirin: Secondary | ICD-10-CM | POA: Diagnosis not present

## 2016-10-11 DIAGNOSIS — R55 Syncope and collapse: Secondary | ICD-10-CM | POA: Insufficient documentation

## 2016-10-11 DIAGNOSIS — I255 Ischemic cardiomyopathy: Secondary | ICD-10-CM | POA: Diagnosis not present

## 2016-10-11 DIAGNOSIS — I2582 Chronic total occlusion of coronary artery: Secondary | ICD-10-CM | POA: Insufficient documentation

## 2016-10-11 DIAGNOSIS — E785 Hyperlipidemia, unspecified: Secondary | ICD-10-CM | POA: Diagnosis not present

## 2016-10-11 DIAGNOSIS — I252 Old myocardial infarction: Secondary | ICD-10-CM | POA: Diagnosis not present

## 2016-10-11 DIAGNOSIS — Z7984 Long term (current) use of oral hypoglycemic drugs: Secondary | ICD-10-CM | POA: Diagnosis not present

## 2016-10-11 DIAGNOSIS — N400 Enlarged prostate without lower urinary tract symptoms: Secondary | ICD-10-CM | POA: Diagnosis not present

## 2016-10-11 DIAGNOSIS — Z87891 Personal history of nicotine dependence: Secondary | ICD-10-CM | POA: Diagnosis not present

## 2016-10-11 DIAGNOSIS — E1122 Type 2 diabetes mellitus with diabetic chronic kidney disease: Secondary | ICD-10-CM | POA: Insufficient documentation

## 2016-10-11 DIAGNOSIS — I2510 Atherosclerotic heart disease of native coronary artery without angina pectoris: Secondary | ICD-10-CM | POA: Diagnosis not present

## 2016-10-11 DIAGNOSIS — I130 Hypertensive heart and chronic kidney disease with heart failure and stage 1 through stage 4 chronic kidney disease, or unspecified chronic kidney disease: Secondary | ICD-10-CM | POA: Diagnosis not present

## 2016-10-11 HISTORY — PX: LOOP RECORDER REMOVAL: EP1215

## 2016-10-11 LAB — GLUCOSE, CAPILLARY: Glucose-Capillary: 93 mg/dL (ref 65–99)

## 2016-10-11 SURGERY — LOOP RECORDER REMOVAL

## 2016-10-11 MED ORDER — LIDOCAINE-EPINEPHRINE 1 %-1:100000 IJ SOLN
INTRAMUSCULAR | Status: DC | PRN
  Administered 2016-10-11: 20 mL

## 2016-10-11 MED ORDER — LIDOCAINE-EPINEPHRINE 1 %-1:100000 IJ SOLN
INTRAMUSCULAR | Status: AC
  Filled 2016-10-11: qty 1

## 2016-10-11 SURGICAL SUPPLY — 1 items: PACK LOOP INSERTION (CUSTOM PROCEDURE TRAY) ×3 IMPLANT

## 2016-10-11 NOTE — Interval H&P Note (Signed)
History and Physical Interval Note:  10/11/2016 8:03 AM  Gerald Rosario  has presented today for surgery, with the diagnosis of RRT  The various methods of treatment have been discussed with the patient and family. After consideration of risks, benefits and other options for treatment, the patient has consented to  Procedure(s): Loop Recorder Removal (N/A) as a surgical intervention .  The patient's history has been reviewed, patient examined, no change in status, stable for surgery.  I have reviewed the patient's chart and labs.  Questions were answered to the patient's satisfaction.     Cristopher Peru

## 2016-10-11 NOTE — Discharge Instructions (Signed)
Incision Care, Adult °An incision is a cut that a doctor makes in your skin for surgery (for a procedure). Most times, these cuts are closed after surgery. Your cut from surgery may be closed with stitches (sutures), staples, skin glue, or skin tape (adhesive strips). You may need to return to your doctor to have stitches or staples taken out. This may happen many days or many weeks after your surgery. The cut needs to be well cared for so it does not get infected. °How to care for your cut °Cut care °· Follow instructions from your doctor about how to take care of your cut. Make sure you: °? Wash your hands with soap and water before you change your bandage (dressing). If you cannot use soap and water, use hand sanitizer. °? Change your bandage as told by your doctor. °? Leave stitches, skin glue, or skin tape in place. They may need to stay in place for 2 weeks or longer. If tape strips get loose and curl up, you may trim the loose edges. Do not remove tape strips completely unless your doctor says it is okay. °· Check your cut area every day for signs of infection. Check for: °? More redness, swelling, or pain. °? More fluid or blood. °? Warmth. °? Pus or a bad smell. °· Ask your doctor how to clean the cut. This may include: °? Using mild soap and water. °? Using a clean towel to pat the cut dry after you clean it. °? Putting a cream or ointment on the cut. Do this only as told by your doctor. °? Covering the cut with a clean bandage. °· Ask your doctor when you can leave the cut uncovered. °· Do not take baths, swim, or use a hot tub until your doctor says it is okay. Ask your doctor if you can take showers. You may only be allowed to take sponge baths for bathing. °Medicines °· If you were prescribed an antibiotic medicine, cream, or ointment, take the antibiotic or put it on the cut as told by your doctor. Do not stop taking or putting on the antibiotic even if your condition gets better. °· Take  over-the-counter and prescription medicines only as told by your doctor. °General instructions °· Limit movement around your cut. This helps healing. °? Avoid straining, lifting, or exercise for the first month, or for as long as told by your doctor. °? Follow instructions from your doctor about going back to your normal activities. °? Ask your doctor what activities are safe. °· Protect your cut from the sun when you are outside for the first 6 months, or for as long as told by your doctor. Put on sunscreen around the scar or cover up the scar. °· Keep all follow-up visits as told by your doctor. This is important. °Contact a doctor if: °· Your have more redness, swelling, or pain around the cut. °· You have more fluid or blood coming from the cut. °· Your cut feels warm to the touch. °· You have pus or a bad smell coming from the cut. °· You have a fever or shaking chills. °· You feel sick to your stomach (nauseous) or you throw up (vomit). °· You are dizzy. °· Your stitches or staples come undone. °Get help right away if: °· You have a red streak coming from your cut. °· Your cut bleeds through the bandage and the bleeding does not stop with gentle pressure. °· The edges of your cut open   up and separate. °· You have very bad (severe) pain. °· You have a rash. °· You are confused. °· You pass out (faint). °· You have trouble breathing and you have a fast heartbeat. °This information is not intended to replace advice given to you by your health care provider. Make sure you discuss any questions you have with your health care provider. °Document Released: 06/12/2011 Document Revised: 11/26/2015 Document Reviewed: 11/26/2015 °Elsevier Interactive Patient Education © 2017 [employer] ° °
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
## 2016-10-12 ENCOUNTER — Encounter (HOSPITAL_COMMUNITY): Payer: Self-pay | Admitting: Internal Medicine

## 2016-10-23 ENCOUNTER — Telehealth: Payer: Self-pay | Admitting: *Deleted

## 2016-10-23 ENCOUNTER — Ambulatory Visit (INDEPENDENT_AMBULATORY_CARE_PROVIDER_SITE_OTHER): Payer: Self-pay | Admitting: *Deleted

## 2016-10-23 ENCOUNTER — Encounter (INDEPENDENT_AMBULATORY_CARE_PROVIDER_SITE_OTHER): Payer: Self-pay

## 2016-10-23 DIAGNOSIS — R55 Syncope and collapse: Secondary | ICD-10-CM

## 2016-10-23 NOTE — Progress Notes (Signed)
Wound check appointment, s/p ILR explant. Steri-strips previously removed by patient. Wound without redness or edema. Incision edges approximated, wound well healed. Patient educated about wound care. ROV with Dr. Lovena Le PRN.

## 2016-10-23 NOTE — Telephone Encounter (Signed)
Attempted to reach patient to advise that I ordered him a Care[employer] monitor return kit.  Unable to get call through, will try again tomorrow.

## 2016-10-24 NOTE — Telephone Encounter (Signed)
Able to reach patient.  He verbalizes understanding and appreciation.  No additional questions or concerns at this time.

## 2016-11-02 ENCOUNTER — Other Ambulatory Visit (INDEPENDENT_AMBULATORY_CARE_PROVIDER_SITE_OTHER): Payer: Medicare Other

## 2016-11-02 ENCOUNTER — Other Ambulatory Visit: Payer: Self-pay | Admitting: Emergency Medicine

## 2016-11-02 DIAGNOSIS — E039 Hypothyroidism, unspecified: Secondary | ICD-10-CM

## 2016-11-02 LAB — TSH: TSH: 4.61 u[IU]/mL — ABNORMAL HIGH (ref 0.35–4.50)

## 2016-11-11 ENCOUNTER — Other Ambulatory Visit: Payer: Self-pay | Admitting: Internal Medicine

## 2016-11-13 ENCOUNTER — Other Ambulatory Visit: Payer: Self-pay | Admitting: Emergency Medicine

## 2016-11-13 MED ORDER — GLUCOSE BLOOD VI STRP: ORAL_STRIP | 2 refills | Status: DC

## 2016-12-21 ENCOUNTER — Encounter (INDEPENDENT_AMBULATORY_CARE_PROVIDER_SITE_OTHER): Payer: Medicare Other | Admitting: Ophthalmology

## 2016-12-21 DIAGNOSIS — H43812 Vitreous degeneration, left eye: Secondary | ICD-10-CM

## 2016-12-21 DIAGNOSIS — E113392 Type 2 diabetes mellitus with moderate nonproliferative diabetic retinopathy without macular edema, left eye: Secondary | ICD-10-CM | POA: Diagnosis not present

## 2016-12-21 DIAGNOSIS — E11311 Type 2 diabetes mellitus with unspecified diabetic retinopathy with macular edema: Secondary | ICD-10-CM

## 2016-12-21 DIAGNOSIS — H338 Other retinal detachments: Secondary | ICD-10-CM | POA: Diagnosis not present

## 2016-12-21 DIAGNOSIS — E113311 Type 2 diabetes mellitus with moderate nonproliferative diabetic retinopathy with macular edema, right eye: Secondary | ICD-10-CM | POA: Diagnosis not present

## 2016-12-21 DIAGNOSIS — I10 Essential (primary) hypertension: Secondary | ICD-10-CM | POA: Diagnosis not present

## 2016-12-21 DIAGNOSIS — H35033 Hypertensive retinopathy, bilateral: Secondary | ICD-10-CM | POA: Diagnosis not present

## 2017-03-13 ENCOUNTER — Ambulatory Visit: Payer: Medicare Other | Admitting: Internal Medicine

## 2017-03-22 NOTE — Patient Instructions (Addendum)
  Test(s) ordered today. Your results will be released to MyChart (or called to you) after review, usually within 72hours after test completion. If any changes need to be made, you will be notified at that same time.  No immunizations administered today.   Medications reviewed and updated.  No changes recommended at this time.  Please followup in 6 months   
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
## 2017-03-23 ENCOUNTER — Ambulatory Visit (INDEPENDENT_AMBULATORY_CARE_PROVIDER_SITE_OTHER): Payer: Medicare Other | Admitting: Internal Medicine

## 2017-03-23 ENCOUNTER — Other Ambulatory Visit (INDEPENDENT_AMBULATORY_CARE_PROVIDER_SITE_OTHER): Payer: Medicare Other

## 2017-03-23 ENCOUNTER — Encounter: Payer: Self-pay | Admitting: Internal Medicine

## 2017-03-23 ENCOUNTER — Other Ambulatory Visit: Payer: Self-pay | Admitting: Internal Medicine

## 2017-03-23 VITALS — BP 152/74 | HR 62 | Temp 97.7°F | Resp 16 | Ht 67.5 in | Wt 157.0 lb

## 2017-03-23 DIAGNOSIS — E7800 Pure hypercholesterolemia, unspecified: Secondary | ICD-10-CM

## 2017-03-23 DIAGNOSIS — I2510 Atherosclerotic heart disease of native coronary artery without angina pectoris: Secondary | ICD-10-CM

## 2017-03-23 DIAGNOSIS — I10 Essential (primary) hypertension: Secondary | ICD-10-CM

## 2017-03-23 DIAGNOSIS — I2583 Coronary atherosclerosis due to lipid rich plaque: Secondary | ICD-10-CM

## 2017-03-23 DIAGNOSIS — D509 Iron deficiency anemia, unspecified: Secondary | ICD-10-CM

## 2017-03-23 DIAGNOSIS — R7989 Other specified abnormal findings of blood chemistry: Secondary | ICD-10-CM

## 2017-03-23 DIAGNOSIS — E118 Type 2 diabetes mellitus with unspecified complications: Secondary | ICD-10-CM

## 2017-03-23 LAB — MICROALBUMIN / CREATININE URINE RATIO
Creatinine,U: 41 mg/dL
Microalb Creat Ratio: 1.7 mg/g (ref 0.0–30.0)
Microalb, Ur: <0.7 mg/dL (ref 0.0–1.9)

## 2017-03-23 LAB — CBC WITH DIFFERENTIAL/PLATELET
Basophils Absolute: 0 10*3/uL (ref 0.0–0.1)
Basophils Relative: 0.6 % (ref 0.0–3.0)
Eosinophils Absolute: 0.3 10*3/uL (ref 0.0–0.7)
Eosinophils Relative: 5.3 % — ABNORMAL HIGH (ref 0.0–5.0)
HCT: 36.8 % — ABNORMAL LOW (ref 39.0–52.0)
Hemoglobin: 11.9 g/dL — ABNORMAL LOW (ref 13.0–17.0)
Lymphocytes Relative: 22.6 % (ref 12.0–46.0)
Lymphs Abs: 1.4 10*3/uL (ref 0.7–4.0)
MCHC: 32.4 g/dL (ref 30.0–36.0)
MCV: 91.8 fl (ref 78.0–100.0)
Monocytes Absolute: 0.6 10*3/uL (ref 0.1–1.0)
Monocytes Relative: 9.7 % (ref 3.0–12.0)
Neutro Abs: 3.8 10*3/uL (ref 1.4–7.7)
Neutrophils Relative %: 61.8 % (ref 43.0–77.0)
Platelets: 301 10*3/uL (ref 150.0–400.0)
RBC: 4.01 Mil/uL — ABNORMAL LOW (ref 4.22–5.81)
RDW: 13.5 % (ref 11.5–15.5)
WBC: 6.1 10*3/uL (ref 4.0–10.5)

## 2017-03-23 LAB — COMPREHENSIVE METABOLIC PANEL
ALT: 17 U/L (ref 0–53)
AST: 24 U/L (ref 0–37)
Albumin: 4.2 g/dL (ref 3.5–5.2)
Alkaline Phosphatase: 49 U/L (ref 39–117)
BUN: 23 mg/dL (ref 6–23)
CO2: 29 mEq/L (ref 19–32)
Calcium: 9.4 mg/dL (ref 8.4–10.5)
Chloride: 100 mEq/L (ref 96–112)
Creatinine, Ser: 1.41 mg/dL (ref 0.40–1.50)
GFR: 52.45 mL/min — ABNORMAL LOW (ref >60.00)
Glucose, Bld: 131 mg/dL — ABNORMAL HIGH (ref 70–99)
Potassium: 4.9 mEq/L (ref 3.5–5.1)
Sodium: 135 mEq/L (ref 135–145)
Total Bilirubin: 0.6 mg/dL (ref 0.2–1.2)
Total Protein: 7.3 g/dL (ref 6.0–8.3)

## 2017-03-23 LAB — IRON: Iron: 40 ug/dL — ABNORMAL LOW (ref 42–165)

## 2017-03-23 LAB — LIPID PANEL
Cholesterol: 121 mg/dL (ref 0–200)
HDL: 56.3 mg/dL (ref >39.00)
LDL Cholesterol: 56 mg/dL (ref 0–99)
NonHDL: 65.17
Total CHOL/HDL Ratio: 2
Triglycerides: 48 mg/dL (ref 0.0–149.0)
VLDL: 9.6 mg/dL (ref 0.0–40.0)

## 2017-03-23 LAB — HEMOGLOBIN A1C: Hgb A1c MFr Bld: 6.3 % (ref 4.6–6.5)

## 2017-03-23 LAB — TSH: TSH: 5.65 u[IU]/mL — ABNORMAL HIGH (ref 0.35–4.50)

## 2017-03-23 LAB — FERRITIN: Ferritin: 39.6 ng/mL (ref 22.0–322.0)

## 2017-03-23 NOTE — Assessment & Plan Note (Signed)
Check a1c Low sugar / carb diet Stressed regular exercise   

## 2017-03-23 NOTE — Assessment & Plan Note (Signed)
No chest pain, palpitations, sob Continue current medication Following with cardiology

## 2017-03-23 NOTE — Assessment & Plan Note (Addendum)
Well controlled at home Continue to monitor at home Continue current medication cmp

## 2017-03-23 NOTE — Assessment & Plan Note (Signed)
He has had an elevated TSH a couple of times in the recent past He has gained some weight, but states this could be related to exercising a little less than usual and eating a little more Check TSH

## 2017-03-23 NOTE — Assessment & Plan Note (Signed)
Check CBC, iron, ferritin 

## 2017-03-23 NOTE — Assessment & Plan Note (Signed)
Check lipid panel  Continue daily statin Regular exercise and healthy diet encouraged  

## 2017-03-28 MED ORDER — ACCU-CHEK FASTCLIX LANCETS MISC: 2 refills | Status: DC

## 2017-03-28 NOTE — Telephone Encounter (Signed)
Rec'd fax needing Dx code for lancets. Resent to pof.Marland KitchenJohny Chess

## 2017-03-28 NOTE — Addendum Note (Signed)
Addended by: Earnstine Regal on: 03/28/2017 12:29 PM   Modules accepted: Orders

## 2017-03-29 ENCOUNTER — Encounter: Payer: Self-pay | Admitting: Emergency Medicine

## 2017-04-02 ENCOUNTER — Other Ambulatory Visit: Payer: Self-pay | Admitting: Emergency Medicine

## 2017-04-02 MED ORDER — LEVOTHYROXINE SODIUM 25 MCG PO TABS: 25.0000 ug | ORAL_TABLET | Freq: Every day | ORAL | 1 refills | Status: DC

## 2017-04-06 ENCOUNTER — Telehealth: Payer: Self-pay | Admitting: Emergency Medicine

## 2017-04-06 NOTE — Telephone Encounter (Signed)
Spoke with pt to advise that OV notes, labs, and RX were sent to the [place]. Advised pt on how to take med.

## 2017-04-06 NOTE — Telephone Encounter (Signed)
Copied from [place] [phone]. Topic: General - Other >> Apr 06, 2017  9:49 AM Conception Chancy, NT wrote: Reason for CRM: patient would like a call back from Dr. Quay Burow nurse Lovena Le. States she is supposed to be sending his records to someone and he wants her to give him a call first.

## 2017-04-26 ENCOUNTER — Encounter (INDEPENDENT_AMBULATORY_CARE_PROVIDER_SITE_OTHER): Payer: Medicare Other | Admitting: Ophthalmology

## 2017-05-01 ENCOUNTER — Encounter (INDEPENDENT_AMBULATORY_CARE_PROVIDER_SITE_OTHER): Payer: Medicare Other | Admitting: Ophthalmology

## 2017-05-01 DIAGNOSIS — E113313 Type 2 diabetes mellitus with moderate nonproliferative diabetic retinopathy with macular edema, bilateral: Secondary | ICD-10-CM

## 2017-05-01 DIAGNOSIS — E11311 Type 2 diabetes mellitus with unspecified diabetic retinopathy with macular edema: Secondary | ICD-10-CM | POA: Diagnosis not present

## 2017-05-01 DIAGNOSIS — H35033 Hypertensive retinopathy, bilateral: Secondary | ICD-10-CM

## 2017-05-01 DIAGNOSIS — I10 Essential (primary) hypertension: Secondary | ICD-10-CM | POA: Diagnosis not present

## 2017-05-01 DIAGNOSIS — H338 Other retinal detachments: Secondary | ICD-10-CM

## 2017-05-01 DIAGNOSIS — H43812 Vitreous degeneration, left eye: Secondary | ICD-10-CM

## 2017-06-18 ENCOUNTER — Other Ambulatory Visit: Payer: Self-pay | Admitting: Internal Medicine

## 2017-07-11 LAB — HM DIABETES EYE EXAM

## 2017-07-13 ENCOUNTER — Encounter: Payer: Self-pay | Admitting: Internal Medicine

## 2017-08-30 ENCOUNTER — Encounter (INDEPENDENT_AMBULATORY_CARE_PROVIDER_SITE_OTHER): Payer: Medicare Other | Admitting: Ophthalmology

## 2017-08-30 DIAGNOSIS — E11319 Type 2 diabetes mellitus with unspecified diabetic retinopathy without macular edema: Secondary | ICD-10-CM | POA: Diagnosis not present

## 2017-08-30 DIAGNOSIS — I10 Essential (primary) hypertension: Secondary | ICD-10-CM | POA: Diagnosis not present

## 2017-08-30 DIAGNOSIS — H43813 Vitreous degeneration, bilateral: Secondary | ICD-10-CM

## 2017-08-30 DIAGNOSIS — H35033 Hypertensive retinopathy, bilateral: Secondary | ICD-10-CM

## 2017-08-30 DIAGNOSIS — E113393 Type 2 diabetes mellitus with moderate nonproliferative diabetic retinopathy without macular edema, bilateral: Secondary | ICD-10-CM | POA: Diagnosis not present

## 2017-09-19 DIAGNOSIS — E039 Hypothyroidism, unspecified: Secondary | ICD-10-CM | POA: Insufficient documentation

## 2017-09-19 NOTE — Patient Instructions (Addendum)
Ask the [place] about switching your glipizide to jardiance or farxiga.    Test(s) ordered today. Your results will be released to [place] (or called to you) after review, usually within 72hours after test completion. If any changes need to be made, you will be notified at that same time.  Medications reviewed and updated.  No changes recommended at this time.    Please followup in 6 months
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
## 2017-09-20 ENCOUNTER — Ambulatory Visit (INDEPENDENT_AMBULATORY_CARE_PROVIDER_SITE_OTHER): Payer: Medicare Other | Admitting: *Deleted

## 2017-09-20 ENCOUNTER — Ambulatory Visit (INDEPENDENT_AMBULATORY_CARE_PROVIDER_SITE_OTHER): Payer: Medicare Other | Admitting: Internal Medicine

## 2017-09-20 ENCOUNTER — Other Ambulatory Visit (INDEPENDENT_AMBULATORY_CARE_PROVIDER_SITE_OTHER): Payer: Medicare Other

## 2017-09-20 ENCOUNTER — Encounter: Payer: Self-pay | Admitting: Internal Medicine

## 2017-09-20 VITALS — BP 124/72 | HR 55 | Temp 97.7°F | Resp 16 | Wt 145.0 lb

## 2017-09-20 VITALS — BP 124/72 | HR 55 | Resp 16 | Ht 68.0 in | Wt 145.0 lb

## 2017-09-20 DIAGNOSIS — I10 Essential (primary) hypertension: Secondary | ICD-10-CM

## 2017-09-20 DIAGNOSIS — E118 Type 2 diabetes mellitus with unspecified complications: Secondary | ICD-10-CM | POA: Diagnosis not present

## 2017-09-20 DIAGNOSIS — J4530 Mild persistent asthma, uncomplicated: Secondary | ICD-10-CM

## 2017-09-20 DIAGNOSIS — I2583 Coronary atherosclerosis due to lipid rich plaque: Secondary | ICD-10-CM

## 2017-09-20 DIAGNOSIS — E039 Hypothyroidism, unspecified: Secondary | ICD-10-CM | POA: Diagnosis not present

## 2017-09-20 DIAGNOSIS — I2510 Atherosclerotic heart disease of native coronary artery without angina pectoris: Secondary | ICD-10-CM

## 2017-09-20 DIAGNOSIS — I480 Paroxysmal atrial fibrillation: Secondary | ICD-10-CM | POA: Diagnosis not present

## 2017-09-20 DIAGNOSIS — E7800 Pure hypercholesterolemia, unspecified: Secondary | ICD-10-CM

## 2017-09-20 DIAGNOSIS — Z0000 Encounter for general adult medical examination without abnormal findings: Secondary | ICD-10-CM

## 2017-09-20 DIAGNOSIS — R42 Dizziness and giddiness: Secondary | ICD-10-CM

## 2017-09-20 DIAGNOSIS — D509 Iron deficiency anemia, unspecified: Secondary | ICD-10-CM

## 2017-09-20 LAB — LIPID PANEL
Cholesterol: 123 mg/dL (ref 0–200)
HDL: 52.9 mg/dL (ref >39.00)
LDL Cholesterol: 58 mg/dL (ref 0–99)
NonHDL: 70.39
Total CHOL/HDL Ratio: 2
Triglycerides: 63 mg/dL (ref 0.0–149.0)
VLDL: 12.6 mg/dL (ref 0.0–40.0)

## 2017-09-20 LAB — COMPREHENSIVE METABOLIC PANEL
ALT: 13 U/L (ref 0–53)
AST: 19 U/L (ref 0–37)
Albumin: 4.2 g/dL (ref 3.5–5.2)
Alkaline Phosphatase: 47 U/L (ref 39–117)
BUN: 23 mg/dL (ref 6–23)
CO2: 25 mEq/L (ref 19–32)
Calcium: 9.4 mg/dL (ref 8.4–10.5)
Chloride: 106 mEq/L (ref 96–112)
Creatinine, Ser: 1.28 mg/dL (ref 0.40–1.50)
GFR: 58.56 mL/min — ABNORMAL LOW (ref >60.00)
Glucose, Bld: 130 mg/dL — ABNORMAL HIGH (ref 70–99)
Potassium: 4.7 mEq/L (ref 3.5–5.1)
Sodium: 137 mEq/L (ref 135–145)
Total Bilirubin: 0.6 mg/dL (ref 0.2–1.2)
Total Protein: 7.1 g/dL (ref 6.0–8.3)

## 2017-09-20 LAB — CBC WITH DIFFERENTIAL/PLATELET
Basophils Absolute: 0.1 10*3/uL (ref 0.0–0.1)
Basophils Relative: 0.8 % (ref 0.0–3.0)
Eosinophils Absolute: 0.7 10*3/uL (ref 0.0–0.7)
Eosinophils Relative: 10.1 % — ABNORMAL HIGH (ref 0.0–5.0)
HCT: 36.5 % — ABNORMAL LOW (ref 39.0–52.0)
Hemoglobin: 12.2 g/dL — ABNORMAL LOW (ref 13.0–17.0)
Lymphocytes Relative: 20 % (ref 12.0–46.0)
Lymphs Abs: 1.4 10*3/uL (ref 0.7–4.0)
MCHC: 33.6 g/dL (ref 30.0–36.0)
MCV: 90.8 fl (ref 78.0–100.0)
Monocytes Absolute: 0.6 10*3/uL (ref 0.1–1.0)
Monocytes Relative: 8.2 % (ref 3.0–12.0)
Neutro Abs: 4.1 10*3/uL (ref 1.4–7.7)
Neutrophils Relative %: 60.9 % (ref 43.0–77.0)
Platelets: 292 10*3/uL (ref 150.0–400.0)
RBC: 4.02 Mil/uL — ABNORMAL LOW (ref 4.22–5.81)
RDW: 14.1 % (ref 11.5–15.5)
WBC: 6.8 10*3/uL (ref 4.0–10.5)

## 2017-09-20 LAB — TSH: TSH: 2.83 u[IU]/mL (ref 0.35–4.50)

## 2017-09-20 LAB — HEMOGLOBIN A1C: Hgb A1c MFr Bld: 6.4 % (ref 4.6–6.5)

## 2017-09-20 MED ORDER — ATORVASTATIN CALCIUM 40 MG PO TABS: 40.0000 mg | ORAL_TABLET | Freq: Every day | ORAL | 3 refills | Status: DC

## 2017-09-20 NOTE — Patient Instructions (Addendum)
Continue doing brain stimulating activities (puzzles, reading, adult coloring books, staying active) to keep memory sharp.   Continue to eat heart healthy diet (full of fruits, vegetables, whole grains, lean protein, water--limit salt, fat, and sugar intake) and increase physical activity as tolerated.   Gerald Rosario , Thank you for taking time to come for your Medicare Wellness Visit. I appreciate your ongoing commitment to your health goals. Please review the following plan we discussed and let me know if I can assist you in the future.   These are the goals we discussed: Goals    . Patient Stated     Continue to exercise,  eat healthy, volunteer, take computer courses at [employer].       This is a list of the screening recommended for you and due dates:  Health Maintenance  Topic Date Due  . Colon Cancer Screening  10/14/1994  . Complete foot exam   09/05/2017  . Hemoglobin A1C  09/21/2017  . Flu Shot  11/01/2017  . Urine Protein Check  03/23/2018  . Eye exam for diabetics  07/12/2018  . Tetanus Vaccine  11/09/2020  .  Hepatitis C: One time screening is recommended by Center for Disease Control  (CDC) for  adults born from [birth year] through 1966.   Completed  . Pneumonia vaccines  Completed

## 2017-09-20 NOTE — Assessment & Plan Note (Signed)
BP well controlled Current regimen effective and well tolerated Continue current medications at current doses cmp  
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
## 2017-09-20 NOTE — Assessment & Plan Note (Signed)
Check tsh  Titrate med dose if needed  

## 2017-09-20 NOTE — Assessment & Plan Note (Signed)
Sugars well controlled at home Taking glipizide 7.5 mg every day Medications managed by [facility]-advised to ask about considering changing to [place] or [place]

## 2017-09-20 NOTE — Assessment & Plan Note (Signed)
History of iron deficiency anemia Taking iron Check CBC, iron panel

## 2017-09-20 NOTE — Assessment & Plan Note (Signed)
Check lipid panel, CMP Continue statin Continue regular exercise and healthy diet

## 2017-09-20 NOTE — Assessment & Plan Note (Signed)
Asymptomatic Rate controlled Continue current medications Check CBC, CMP

## 2017-09-20 NOTE — Assessment & Plan Note (Signed)
He is wheezing on exam today He is using his inhaler and will continue to use it as needed No other concerning symptoms He will call or return if his wheezing worsens or does not resolve

## 2017-09-20 NOTE — Assessment & Plan Note (Signed)
No chest pain, palpitations or shortness of breath Exercising regularly Check CBC, CMP, TSH Continue current medications

## 2017-09-20 NOTE — Assessment & Plan Note (Signed)
Symptoms sound more consistent with orthostatic hypotension When he has checked his blood pressure it has been normal He will continue to monitor-may need to consider decreasing metoprolol to 12.5 mg We will check lab-CBC, CMP

## 2017-09-21 ENCOUNTER — Encounter: Payer: Self-pay | Admitting: Emergency Medicine

## 2017-09-21 LAB — IRON,TIBC AND FERRITIN PANEL
%SAT: 28 % (calc) (ref 20–48)
Ferritin: 62 ng/mL (ref 24–380)
Iron: 69 ug/dL (ref 50–180)
TIBC: 248 mcg/dL (calc) — ABNORMAL LOW (ref 250–425)

## 2017-11-15 DIAGNOSIS — N401 Enlarged prostate with lower urinary tract symptoms: Secondary | ICD-10-CM | POA: Diagnosis not present

## 2017-11-15 DIAGNOSIS — R3914 Feeling of incomplete bladder emptying: Secondary | ICD-10-CM | POA: Diagnosis not present

## 2017-11-27 DIAGNOSIS — N390 Urinary tract infection, site not specified: Secondary | ICD-10-CM | POA: Diagnosis not present

## 2017-12-27 ENCOUNTER — Other Ambulatory Visit: Payer: Self-pay | Admitting: Internal Medicine

## 2018-01-01 ENCOUNTER — Encounter (INDEPENDENT_AMBULATORY_CARE_PROVIDER_SITE_OTHER): Payer: Medicare Other | Admitting: Ophthalmology

## 2018-01-03 ENCOUNTER — Other Ambulatory Visit: Payer: Self-pay

## 2018-01-03 MED ORDER — GLUCOSE BLOOD VI STRP: 1.0000 | ORAL_STRIP | Freq: Two times a day (BID) | 1 refills | Status: AC

## 2018-01-15 ENCOUNTER — Encounter (INDEPENDENT_AMBULATORY_CARE_PROVIDER_SITE_OTHER): Payer: Medicare Other | Admitting: Ophthalmology

## 2018-01-15 DIAGNOSIS — I10 Essential (primary) hypertension: Secondary | ICD-10-CM | POA: Diagnosis not present

## 2018-01-15 DIAGNOSIS — E113393 Type 2 diabetes mellitus with moderate nonproliferative diabetic retinopathy without macular edema, bilateral: Secondary | ICD-10-CM | POA: Diagnosis not present

## 2018-01-15 DIAGNOSIS — E11319 Type 2 diabetes mellitus with unspecified diabetic retinopathy without macular edema: Secondary | ICD-10-CM | POA: Diagnosis not present

## 2018-01-15 DIAGNOSIS — H43813 Vitreous degeneration, bilateral: Secondary | ICD-10-CM | POA: Diagnosis not present

## 2018-01-15 DIAGNOSIS — H35033 Hypertensive retinopathy, bilateral: Secondary | ICD-10-CM
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
## 2018-03-21 ENCOUNTER — Encounter: Payer: Self-pay | Admitting: Internal Medicine

## 2018-03-21 ENCOUNTER — Ambulatory Visit (INDEPENDENT_AMBULATORY_CARE_PROVIDER_SITE_OTHER): Payer: Medicare Other | Admitting: Internal Medicine

## 2018-03-21 ENCOUNTER — Other Ambulatory Visit (INDEPENDENT_AMBULATORY_CARE_PROVIDER_SITE_OTHER): Payer: Medicare Other

## 2018-03-21 VITALS — BP 116/60 | HR 58 | Temp 97.8°F | Resp 16 | Ht 68.0 in | Wt 147.0 lb

## 2018-03-21 DIAGNOSIS — E7800 Pure hypercholesterolemia, unspecified: Secondary | ICD-10-CM

## 2018-03-21 DIAGNOSIS — I10 Essential (primary) hypertension: Secondary | ICD-10-CM

## 2018-03-21 DIAGNOSIS — I2583 Coronary atherosclerosis due to lipid rich plaque: Secondary | ICD-10-CM | POA: Diagnosis not present

## 2018-03-21 DIAGNOSIS — G6289 Other specified polyneuropathies: Secondary | ICD-10-CM | POA: Diagnosis not present

## 2018-03-21 DIAGNOSIS — M2042 Other hammer toe(s) (acquired), left foot: Secondary | ICD-10-CM

## 2018-03-21 DIAGNOSIS — M2041 Other hammer toe(s) (acquired), right foot: Secondary | ICD-10-CM | POA: Diagnosis not present

## 2018-03-21 DIAGNOSIS — I480 Paroxysmal atrial fibrillation: Secondary | ICD-10-CM | POA: Diagnosis not present

## 2018-03-21 DIAGNOSIS — G629 Polyneuropathy, unspecified: Secondary | ICD-10-CM | POA: Insufficient documentation

## 2018-03-21 DIAGNOSIS — E039 Hypothyroidism, unspecified: Secondary | ICD-10-CM

## 2018-03-21 DIAGNOSIS — E1140 Type 2 diabetes mellitus with diabetic neuropathy, unspecified: Secondary | ICD-10-CM

## 2018-03-21 DIAGNOSIS — I2510 Atherosclerotic heart disease of native coronary artery without angina pectoris: Secondary | ICD-10-CM

## 2018-03-21 LAB — COMPREHENSIVE METABOLIC PANEL
ALT: 10 U/L (ref 0–53)
AST: 19 U/L (ref 0–37)
Albumin: 4.1 g/dL (ref 3.5–5.2)
Alkaline Phosphatase: 48 U/L (ref 39–117)
BUN: 20 mg/dL (ref 6–23)
CO2: 26 mEq/L (ref 19–32)
Calcium: 9.7 mg/dL (ref 8.4–10.5)
Chloride: 102 mEq/L (ref 96–112)
Creatinine, Ser: 1.44 mg/dL (ref 0.40–1.50)
GFR: 51.05 mL/min — ABNORMAL LOW (ref >60.00)
Glucose, Bld: 152 mg/dL — ABNORMAL HIGH (ref 70–99)
Potassium: 4.9 mEq/L (ref 3.5–5.1)
Sodium: 136 mEq/L (ref 135–145)
Total Bilirubin: 0.7 mg/dL (ref 0.2–1.2)
Total Protein: 7.2 g/dL (ref 6.0–8.3)

## 2018-03-21 LAB — LIPID PANEL
Cholesterol: 144 mg/dL (ref 0–200)
HDL: 60.5 mg/dL (ref >39.00)
LDL Cholesterol: 68 mg/dL (ref 0–99)
NonHDL: 83.35
Total CHOL/HDL Ratio: 2
Triglycerides: 77 mg/dL (ref 0.0–149.0)
VLDL: 15.4 mg/dL (ref 0.0–40.0)

## 2018-03-21 LAB — CBC WITH DIFFERENTIAL/PLATELET
Basophils Absolute: 0.1 10*3/uL (ref 0.0–0.1)
Basophils Relative: 0.9 % (ref 0.0–3.0)
Eosinophils Absolute: 0.5 10*3/uL (ref 0.0–0.7)
Eosinophils Relative: 7.3 % — ABNORMAL HIGH (ref 0.0–5.0)
HCT: 39.2 % (ref 39.0–52.0)
Hemoglobin: 13.1 g/dL (ref 13.0–17.0)
Lymphocytes Relative: 19.3 % (ref 12.0–46.0)
Lymphs Abs: 1.3 10*3/uL (ref 0.7–4.0)
MCHC: 33.3 g/dL (ref 30.0–36.0)
MCV: 90.2 fl (ref 78.0–100.0)
Monocytes Absolute: 0.5 10*3/uL (ref 0.1–1.0)
Monocytes Relative: 8.1 % (ref 3.0–12.0)
Neutro Abs: 4.3 10*3/uL (ref 1.4–7.7)
Neutrophils Relative %: 64.4 % (ref 43.0–77.0)
Platelets: 343 10*3/uL (ref 150.0–400.0)
RBC: 4.34 Mil/uL (ref 4.22–5.81)
RDW: 13.8 % (ref 11.5–15.5)
WBC: 6.7 10*3/uL (ref 4.0–10.5)

## 2018-03-21 LAB — TSH: TSH: 3.24 u[IU]/mL (ref 0.35–4.50)

## 2018-03-21 LAB — HEMOGLOBIN A1C: Hgb A1c MFr Bld: 6.7 % — ABNORMAL HIGH (ref 4.6–6.5)

## 2018-03-21 NOTE — Assessment & Plan Note (Signed)
Asymptomatic Follows with cardiology Cbc, cmp, tsh

## 2018-03-21 NOTE — Assessment & Plan Note (Signed)
Hammertoes in b/l feet, also has arthritis Has pain in feet rx for diabetic shoes given

## 2018-03-21 NOTE — Assessment & Plan Note (Signed)
BP well controlled Current regimen effective and well tolerated Continue current medications at current doses cmp  

## 2018-03-21 NOTE — Assessment & Plan Note (Signed)
Asymptomatic Continue current medications Follows with cardiology Cmp, lipids

## 2018-03-21 NOTE — Assessment & Plan Note (Addendum)
a1c Continue diabetic diet Continue regular exercise rx for diabetic shoes given neuropathy, hammertoes, arthritis With his history should ideally be on farxiga or jardiance - he will discuss with the [place]

## 2018-03-21 NOTE — Assessment & Plan Note (Signed)
Taking the statin three times a week Check lipid panel  Continue daily statin Regular exercise and healthy diet encouraged

## 2018-03-21 NOTE — Patient Instructions (Addendum)
  Tests ordered today. Your results will be released to [place] (or called to you) after review, usually within 72hours after test completion. If any changes need to be made, you will be notified at that same time.   Medications reviewed and updated.  Changes include :   none    A prescription was given for diabetic shoes  Please followup in 6 months

## 2018-03-21 NOTE — Assessment & Plan Note (Signed)
Decreased sensation in b/l feet Has pain in feet No lesions rx for diabetic shoes given

## 2018-03-21 NOTE — Assessment & Plan Note (Signed)
Clinically euthyroid Check tsh  Titrate med dose if needed  

## 2018-05-21 ENCOUNTER — Encounter (INDEPENDENT_AMBULATORY_CARE_PROVIDER_SITE_OTHER): Payer: Medicare Other | Admitting: Ophthalmology

## 2018-07-08 ENCOUNTER — Other Ambulatory Visit: Payer: Self-pay | Admitting: Internal Medicine
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
## 2018-09-18 NOTE — Patient Instructions (Addendum)
  Medications reviewed and updated.  Changes include :   none    Please followup in 6 months   

## 2018-09-19 ENCOUNTER — Other Ambulatory Visit: Payer: Self-pay

## 2018-09-19 ENCOUNTER — Encounter: Payer: Self-pay | Admitting: Internal Medicine

## 2018-09-19 ENCOUNTER — Ambulatory Visit (INDEPENDENT_AMBULATORY_CARE_PROVIDER_SITE_OTHER): Payer: Medicare HMO | Admitting: Internal Medicine

## 2018-09-19 ENCOUNTER — Other Ambulatory Visit (INDEPENDENT_AMBULATORY_CARE_PROVIDER_SITE_OTHER): Payer: Medicare HMO

## 2018-09-19 VITALS — BP 98/54 | HR 55 | Temp 97.3°F | Ht 68.0 in | Wt 151.0 lb

## 2018-09-19 DIAGNOSIS — E039 Hypothyroidism, unspecified: Secondary | ICD-10-CM

## 2018-09-19 DIAGNOSIS — I10 Essential (primary) hypertension: Secondary | ICD-10-CM | POA: Diagnosis not present

## 2018-09-19 DIAGNOSIS — I5022 Chronic systolic (congestive) heart failure: Secondary | ICD-10-CM

## 2018-09-19 DIAGNOSIS — E1140 Type 2 diabetes mellitus with diabetic neuropathy, unspecified: Secondary | ICD-10-CM

## 2018-09-19 DIAGNOSIS — K219 Gastro-esophageal reflux disease without esophagitis: Secondary | ICD-10-CM | POA: Diagnosis not present

## 2018-09-19 DIAGNOSIS — I480 Paroxysmal atrial fibrillation: Secondary | ICD-10-CM

## 2018-09-19 DIAGNOSIS — E7800 Pure hypercholesterolemia, unspecified: Secondary | ICD-10-CM

## 2018-09-19 LAB — COMPREHENSIVE METABOLIC PANEL
ALT: 14 U/L (ref 0–53)
AST: 21 U/L (ref 0–37)
Albumin: 4 g/dL (ref 3.5–5.2)
Alkaline Phosphatase: 53 U/L (ref 39–117)
BUN: 16 mg/dL (ref 6–23)
CO2: 27 mEq/L (ref 19–32)
Calcium: 9.5 mg/dL (ref 8.4–10.5)
Chloride: 98 mEq/L (ref 96–112)
Creatinine, Ser: 1.34 mg/dL (ref 0.40–1.50)
GFR: 52.12 mL/min — ABNORMAL LOW (ref >60.00)
Glucose, Bld: 121 mg/dL — ABNORMAL HIGH (ref 70–99)
Potassium: 4.5 mEq/L (ref 3.5–5.1)
Sodium: 135 mEq/L (ref 135–145)
Total Bilirubin: 0.8 mg/dL (ref 0.2–1.2)
Total Protein: 6.9 g/dL (ref 6.0–8.3)

## 2018-09-19 LAB — MICROALBUMIN / CREATININE URINE RATIO
Creatinine,U: 27.3 mg/dL
Microalb Creat Ratio: 9 mg/g (ref 0.0–30.0)
Microalb, Ur: 2.5 mg/dL — ABNORMAL HIGH (ref 0.0–1.9)

## 2018-09-19 LAB — LIPID PANEL
Cholesterol: 124 mg/dL (ref 0–200)
HDL: 51.3 mg/dL (ref >39.00)
LDL Cholesterol: 57 mg/dL (ref 0–99)
NonHDL: 72.55
Total CHOL/HDL Ratio: 2
Triglycerides: 79 mg/dL (ref 0.0–149.0)
VLDL: 15.8 mg/dL (ref 0.0–40.0)

## 2018-09-19 LAB — TSH: TSH: 4.16 u[IU]/mL (ref 0.35–4.50)

## 2018-09-19 LAB — HEMOGLOBIN A1C: Hgb A1c MFr Bld: 6.4 % (ref 4.6–6.5)

## 2018-09-19 NOTE — Assessment & Plan Note (Signed)
A1c last month 6.3% at the [facility] He monitors his sugar regularly at home Continue glipizide Continue regular exercise He will get his blood work from the [facility] sent to me

## 2018-09-19 NOTE — Assessment & Plan Note (Signed)
BP on the low side - medication was just adjusted He will monitor his blood pressure at home and contact the [facility] if his pressure is consistently low [facility]

## 2018-09-19 NOTE — Assessment & Plan Note (Signed)
Had recent blood work done at the [facility] Medication managed by [facility]

## 2018-09-19 NOTE — Assessment & Plan Note (Signed)
Continue statin. 

## 2018-09-19 NOTE — Assessment & Plan Note (Signed)
In sinus rhythm, asymptomatic Taking aspirin 81 mg daily Follows with the [facility]

## 2018-09-19 NOTE — Assessment & Plan Note (Signed)
Euvolemic on exam [facility] Continue current medications

## 2018-09-19 NOTE — Assessment & Plan Note (Signed)
GERD is controlled He is trying to slowly taper off the pantoprazole
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
## 2018-11-15 ENCOUNTER — Ambulatory Visit (INDEPENDENT_AMBULATORY_CARE_PROVIDER_SITE_OTHER): Payer: Medicare HMO | Admitting: *Deleted

## 2018-11-15 DIAGNOSIS — Z0000 Encounter for general adult medical examination without abnormal findings: Secondary | ICD-10-CM | POA: Diagnosis not present
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
## 2019-03-20 NOTE — Patient Instructions (Addendum)
  Tests ordered today. Your results will be released to MyChart (or called to you) after review.  If any changes need to be made, you will be notified at that same time.    Medications reviewed and updated.  Changes include :   none     Please followup in 6 months   

## 2019-03-21 ENCOUNTER — Ambulatory Visit (INDEPENDENT_AMBULATORY_CARE_PROVIDER_SITE_OTHER): Payer: Medicare HMO | Admitting: Internal Medicine

## 2019-03-21 ENCOUNTER — Encounter: Payer: Self-pay | Admitting: Internal Medicine

## 2019-03-21 ENCOUNTER — Other Ambulatory Visit (INDEPENDENT_AMBULATORY_CARE_PROVIDER_SITE_OTHER): Payer: Medicare HMO

## 2019-03-21 ENCOUNTER — Other Ambulatory Visit: Payer: Self-pay

## 2019-03-21 VITALS — BP 124/76 | HR 60 | Temp 97.6°F | Ht 68.0 in | Wt 154.0 lb

## 2019-03-21 DIAGNOSIS — J4530 Mild persistent asthma, uncomplicated: Secondary | ICD-10-CM

## 2019-03-21 DIAGNOSIS — I10 Essential (primary) hypertension: Secondary | ICD-10-CM

## 2019-03-21 DIAGNOSIS — E039 Hypothyroidism, unspecified: Secondary | ICD-10-CM

## 2019-03-21 DIAGNOSIS — E1140 Type 2 diabetes mellitus with diabetic neuropathy, unspecified: Secondary | ICD-10-CM | POA: Diagnosis not present

## 2019-03-21 DIAGNOSIS — I2510 Atherosclerotic heart disease of native coronary artery without angina pectoris: Secondary | ICD-10-CM

## 2019-03-21 DIAGNOSIS — E7800 Pure hypercholesterolemia, unspecified: Secondary | ICD-10-CM | POA: Diagnosis not present

## 2019-03-21 DIAGNOSIS — K219 Gastro-esophageal reflux disease without esophagitis: Secondary | ICD-10-CM | POA: Diagnosis not present

## 2019-03-21 DIAGNOSIS — I480 Paroxysmal atrial fibrillation: Secondary | ICD-10-CM | POA: Diagnosis not present

## 2019-03-21 LAB — COMPREHENSIVE METABOLIC PANEL
ALT: 15 U/L (ref 0–53)
AST: 21 U/L (ref 0–37)
Albumin: 4 g/dL (ref 3.5–5.2)
Alkaline Phosphatase: 61 U/L (ref 39–117)
BUN: 18 mg/dL (ref 6–23)
CO2: 27 mEq/L (ref 19–32)
Calcium: 9.4 mg/dL (ref 8.4–10.5)
Chloride: 103 mEq/L (ref 96–112)
Creatinine, Ser: 1.28 mg/dL (ref 0.40–1.50)
GFR: 54.87 mL/min — ABNORMAL LOW (ref >60.00)
Glucose, Bld: 104 mg/dL — ABNORMAL HIGH (ref 70–99)
Potassium: 5.1 mEq/L (ref 3.5–5.1)
Sodium: 134 mEq/L — ABNORMAL LOW (ref 135–145)
Total Bilirubin: 0.5 mg/dL (ref 0.2–1.2)
Total Protein: 7.1 g/dL (ref 6.0–8.3)

## 2019-03-21 LAB — CBC WITH DIFFERENTIAL/PLATELET
Basophils Absolute: 0.1 10*3/uL (ref 0.0–0.1)
Basophils Relative: 0.9 % (ref 0.0–3.0)
Eosinophils Absolute: 0.7 10*3/uL (ref 0.0–0.7)
Eosinophils Relative: 8.7 % — ABNORMAL HIGH (ref 0.0–5.0)
HCT: 38 % — ABNORMAL LOW (ref 39.0–52.0)
Hemoglobin: 12.6 g/dL — ABNORMAL LOW (ref 13.0–17.0)
Lymphocytes Relative: 15.9 % (ref 12.0–46.0)
Lymphs Abs: 1.3 10*3/uL (ref 0.7–4.0)
MCHC: 33.1 g/dL (ref 30.0–36.0)
MCV: 89.5 fl (ref 78.0–100.0)
Monocytes Absolute: 0.7 10*3/uL (ref 0.1–1.0)
Monocytes Relative: 8.2 % (ref 3.0–12.0)
Neutro Abs: 5.3 10*3/uL (ref 1.4–7.7)
Neutrophils Relative %: 66.3 % (ref 43.0–77.0)
Platelets: 448 10*3/uL — ABNORMAL HIGH (ref 150.0–400.0)
RBC: 4.25 Mil/uL (ref 4.22–5.81)
RDW: 14.2 % (ref 11.5–15.5)
WBC: 8 10*3/uL (ref 4.0–10.5)

## 2019-03-21 LAB — LIPID PANEL
Cholesterol: 133 mg/dL (ref 0–200)
HDL: 55.8 mg/dL (ref >39.00)
LDL Cholesterol: 69 mg/dL (ref 0–99)
NonHDL: 77.63
Total CHOL/HDL Ratio: 2
Triglycerides: 44 mg/dL (ref 0.0–149.0)
VLDL: 8.8 mg/dL (ref 0.0–40.0)

## 2019-03-21 LAB — HEMOGLOBIN A1C: Hgb A1c MFr Bld: 7.6 % — ABNORMAL HIGH (ref 4.6–6.5)

## 2019-03-21 LAB — TSH: TSH: 5.31 u[IU]/mL — ABNORMAL HIGH (ref 0.35–4.50)

## 2019-03-21 NOTE — Assessment & Plan Note (Signed)
He denies any coughing, wheezing and shortness of breath Overall asthma controlled Continue Symbicort daily and Combivent as needed

## 2019-03-21 NOTE — Assessment & Plan Note (Signed)
GERD controlled Continue daily medication  

## 2019-03-21 NOTE — Assessment & Plan Note (Signed)
Follow-up with cardiology Asymptomatic

## 2019-03-21 NOTE — Assessment & Plan Note (Signed)
Clinically euthyroid Check tsh  Titrate med dose if needed  

## 2019-03-21 NOTE — Assessment & Plan Note (Addendum)
BP well controlled Current regimen effective and well tolerated Continue current medications at current doses CMP 

## 2019-03-21 NOTE — Assessment & Plan Note (Signed)
Check lipid panel  Continue daily statin Regular exercise and healthy diet encouraged  

## 2019-03-21 NOTE — Assessment & Plan Note (Signed)
No concerning symptoms of chest pain, shortness of breath or palpitations Following with cardiology Continue current medications

## 2019-03-21 NOTE — Assessment & Plan Note (Addendum)
Has not been as good about eating a low sugar/carbohydrate diet Was not exercising at the same intensity, but has recently increased his exercise Sugars were higher at home, but are starting to come down Check A1c

## 2019-03-24 ENCOUNTER — Encounter: Payer: Self-pay | Admitting: *Deleted

## 2019-09-22 ENCOUNTER — Ambulatory Visit: Payer: Medicare HMO | Admitting: Internal Medicine
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
## 2019-09-24 ENCOUNTER — Emergency Department (HOSPITAL_COMMUNITY): Payer: Medicare HMO

## 2019-09-24 ENCOUNTER — Inpatient Hospital Stay (HOSPITAL_COMMUNITY): Payer: Medicare HMO | Admitting: Anesthesiology

## 2019-09-24 ENCOUNTER — Encounter: Payer: Self-pay | Admitting: Internal Medicine

## 2019-09-24 ENCOUNTER — Encounter (HOSPITAL_COMMUNITY)
Admission: EM | Disposition: A | Discharge status: Skilled Nursing Facility | Payer: Self-pay | Source: Ambulatory Visit | Attending: Neurology

## 2019-09-24 ENCOUNTER — Inpatient Hospital Stay (HOSPITAL_COMMUNITY): Payer: Medicare HMO

## 2019-09-24 ENCOUNTER — Ambulatory Visit (INDEPENDENT_AMBULATORY_CARE_PROVIDER_SITE_OTHER): Payer: Medicare HMO | Admitting: Internal Medicine

## 2019-09-24 ENCOUNTER — Inpatient Hospital Stay (HOSPITAL_COMMUNITY)
Admission: EM | Admit: 2019-09-24 | Discharge: 2019-10-07 | DRG: 024 | Disposition: A | Discharge status: Skilled Nursing Facility | Payer: Medicare HMO | Source: Ambulatory Visit | Attending: Neurology | Admitting: Neurology

## 2019-09-24 DIAGNOSIS — Z9842 Cataract extraction status, left eye: Secondary | ICD-10-CM

## 2019-09-24 DIAGNOSIS — N390 Urinary tract infection, site not specified: Secondary | ICD-10-CM | POA: Diagnosis not present

## 2019-09-24 DIAGNOSIS — Z79899 Other long term (current) drug therapy: Secondary | ICD-10-CM

## 2019-09-24 DIAGNOSIS — Z781 Physical restraint status: Secondary | ICD-10-CM

## 2019-09-24 DIAGNOSIS — Z79890 Hormone replacement therapy (postmenopausal): Secondary | ICD-10-CM

## 2019-09-24 DIAGNOSIS — D72829 Elevated white blood cell count, unspecified: Secondary | ICD-10-CM | POA: Diagnosis not present

## 2019-09-24 DIAGNOSIS — D151 Benign neoplasm of heart: Secondary | ICD-10-CM | POA: Diagnosis not present

## 2019-09-24 DIAGNOSIS — I130 Hypertensive heart and chronic kidney disease with heart failure and stage 1 through stage 4 chronic kidney disease, or unspecified chronic kidney disease: Secondary | ICD-10-CM | POA: Diagnosis present

## 2019-09-24 DIAGNOSIS — I639 Cerebral infarction, unspecified: Secondary | ICD-10-CM | POA: Diagnosis present

## 2019-09-24 DIAGNOSIS — E785 Hyperlipidemia, unspecified: Secondary | ICD-10-CM | POA: Diagnosis present

## 2019-09-24 DIAGNOSIS — Z955 Presence of coronary angioplasty implant and graft: Secondary | ICD-10-CM

## 2019-09-24 DIAGNOSIS — R29810 Facial weakness: Secondary | ICD-10-CM | POA: Diagnosis present

## 2019-09-24 DIAGNOSIS — R531 Weakness: Secondary | ICD-10-CM | POA: Insufficient documentation

## 2019-09-24 DIAGNOSIS — I5022 Chronic systolic (congestive) heart failure: Secondary | ICD-10-CM | POA: Diagnosis not present

## 2019-09-24 DIAGNOSIS — R451 Restlessness and agitation: Secondary | ICD-10-CM | POA: Diagnosis not present

## 2019-09-24 DIAGNOSIS — I252 Old myocardial infarction: Secondary | ICD-10-CM | POA: Diagnosis not present

## 2019-09-24 DIAGNOSIS — M6281 Muscle weakness (generalized): Secondary | ICD-10-CM | POA: Diagnosis not present

## 2019-09-24 DIAGNOSIS — R55 Syncope and collapse: Secondary | ICD-10-CM

## 2019-09-24 DIAGNOSIS — E1159 Type 2 diabetes mellitus with other circulatory complications: Secondary | ICD-10-CM | POA: Diagnosis not present

## 2019-09-24 DIAGNOSIS — I480 Paroxysmal atrial fibrillation: Secondary | ICD-10-CM | POA: Diagnosis present

## 2019-09-24 DIAGNOSIS — I110 Hypertensive heart disease with heart failure: Secondary | ICD-10-CM | POA: Diagnosis not present

## 2019-09-24 DIAGNOSIS — N3000 Acute cystitis without hematuria: Secondary | ICD-10-CM | POA: Diagnosis not present

## 2019-09-24 DIAGNOSIS — R338 Other retention of urine: Secondary | ICD-10-CM | POA: Diagnosis not present

## 2019-09-24 DIAGNOSIS — Z800 Family history of malignant neoplasm of digestive organs: Secondary | ICD-10-CM

## 2019-09-24 DIAGNOSIS — I69320 Aphasia following cerebral infarction: Secondary | ICD-10-CM | POA: Diagnosis not present

## 2019-09-24 DIAGNOSIS — I255 Ischemic cardiomyopathy: Secondary | ICD-10-CM | POA: Diagnosis present

## 2019-09-24 DIAGNOSIS — I700 Atherosclerosis of aorta: Secondary | ICD-10-CM | POA: Diagnosis present

## 2019-09-24 DIAGNOSIS — R69 Illness, unspecified: Secondary | ICD-10-CM | POA: Diagnosis not present

## 2019-09-24 DIAGNOSIS — I340 Nonrheumatic mitral (valve) insufficiency: Secondary | ICD-10-CM | POA: Diagnosis not present

## 2019-09-24 DIAGNOSIS — R4701 Aphasia: Secondary | ICD-10-CM | POA: Diagnosis present

## 2019-09-24 DIAGNOSIS — Z87891 Personal history of nicotine dependence: Secondary | ICD-10-CM

## 2019-09-24 DIAGNOSIS — J45909 Unspecified asthma, uncomplicated: Secondary | ICD-10-CM | POA: Diagnosis present

## 2019-09-24 DIAGNOSIS — K219 Gastro-esophageal reflux disease without esophagitis: Secondary | ICD-10-CM | POA: Diagnosis present

## 2019-09-24 DIAGNOSIS — I63213 Cerebral infarction due to unspecified occlusion or stenosis of bilateral vertebral arteries: Secondary | ICD-10-CM | POA: Diagnosis not present

## 2019-09-24 DIAGNOSIS — I424 Endocardial fibroelastosis: Secondary | ICD-10-CM | POA: Diagnosis present

## 2019-09-24 DIAGNOSIS — R7309 Other abnormal glucose: Secondary | ICD-10-CM | POA: Diagnosis not present

## 2019-09-24 DIAGNOSIS — G8191 Hemiplegia, unspecified affecting right dominant side: Secondary | ICD-10-CM | POA: Diagnosis present

## 2019-09-24 DIAGNOSIS — B952 Enterococcus as the cause of diseases classified elsewhere: Secondary | ICD-10-CM | POA: Diagnosis present

## 2019-09-24 DIAGNOSIS — N179 Acute kidney failure, unspecified: Secondary | ICD-10-CM | POA: Diagnosis not present

## 2019-09-24 DIAGNOSIS — I69391 Dysphagia following cerebral infarction: Secondary | ICD-10-CM | POA: Diagnosis not present

## 2019-09-24 DIAGNOSIS — R1312 Dysphagia, oropharyngeal phase: Secondary | ICD-10-CM | POA: Diagnosis not present

## 2019-09-24 DIAGNOSIS — D509 Iron deficiency anemia, unspecified: Secondary | ICD-10-CM | POA: Diagnosis present

## 2019-09-24 DIAGNOSIS — I63412 Cerebral infarction due to embolism of left middle cerebral artery: Secondary | ICD-10-CM | POA: Diagnosis not present

## 2019-09-24 DIAGNOSIS — E1122 Type 2 diabetes mellitus with diabetic chronic kidney disease: Secondary | ICD-10-CM | POA: Diagnosis present

## 2019-09-24 DIAGNOSIS — Z8249 Family history of ischemic heart disease and other diseases of the circulatory system: Secondary | ICD-10-CM

## 2019-09-24 DIAGNOSIS — G8193 Hemiplegia, unspecified affecting right nondominant side: Secondary | ICD-10-CM | POA: Diagnosis not present

## 2019-09-24 DIAGNOSIS — E119 Type 2 diabetes mellitus without complications: Secondary | ICD-10-CM

## 2019-09-24 DIAGNOSIS — I63512 Cerebral infarction due to unspecified occlusion or stenosis of left middle cerebral artery: Principal | ICD-10-CM | POA: Diagnosis present

## 2019-09-24 DIAGNOSIS — R29725 NIHSS score 25: Secondary | ICD-10-CM | POA: Diagnosis present

## 2019-09-24 DIAGNOSIS — R569 Unspecified convulsions: Secondary | ICD-10-CM | POA: Diagnosis not present

## 2019-09-24 DIAGNOSIS — C61 Malignant neoplasm of prostate: Secondary | ICD-10-CM | POA: Diagnosis present

## 2019-09-24 DIAGNOSIS — M2550 Pain in unspecified joint: Secondary | ICD-10-CM | POA: Diagnosis not present

## 2019-09-24 DIAGNOSIS — R29818 Other symptoms and signs involving the nervous system: Secondary | ICD-10-CM | POA: Diagnosis not present

## 2019-09-24 DIAGNOSIS — Z8546 Personal history of malignant neoplasm of prostate: Secondary | ICD-10-CM | POA: Diagnosis not present

## 2019-09-24 DIAGNOSIS — Z7984 Long term (current) use of oral hypoglycemic drugs: Secondary | ICD-10-CM

## 2019-09-24 DIAGNOSIS — Z20822 Contact with and (suspected) exposure to covid-19: Secondary | ICD-10-CM | POA: Diagnosis not present

## 2019-09-24 DIAGNOSIS — E1142 Type 2 diabetes mellitus with diabetic polyneuropathy: Secondary | ICD-10-CM | POA: Diagnosis present

## 2019-09-24 DIAGNOSIS — N1831 Chronic kidney disease, stage 3a: Secondary | ICD-10-CM | POA: Diagnosis present

## 2019-09-24 DIAGNOSIS — N400 Enlarged prostate without lower urinary tract symptoms: Secondary | ICD-10-CM | POA: Diagnosis present

## 2019-09-24 DIAGNOSIS — I472 Ventricular tachycardia: Secondary | ICD-10-CM | POA: Diagnosis not present

## 2019-09-24 DIAGNOSIS — I2510 Atherosclerotic heart disease of native coronary artery without angina pectoris: Secondary | ICD-10-CM | POA: Diagnosis present

## 2019-09-24 DIAGNOSIS — E11649 Type 2 diabetes mellitus with hypoglycemia without coma: Secondary | ICD-10-CM | POA: Diagnosis not present

## 2019-09-24 DIAGNOSIS — E1165 Type 2 diabetes mellitus with hyperglycemia: Secondary | ICD-10-CM | POA: Diagnosis present

## 2019-09-24 DIAGNOSIS — I63312 Cerebral infarction due to thrombosis of left middle cerebral artery: Secondary | ICD-10-CM

## 2019-09-24 DIAGNOSIS — I6602 Occlusion and stenosis of left middle cerebral artery: Secondary | ICD-10-CM | POA: Diagnosis not present

## 2019-09-24 DIAGNOSIS — H9190 Unspecified hearing loss, unspecified ear: Secondary | ICD-10-CM | POA: Diagnosis present

## 2019-09-24 DIAGNOSIS — H518 Other specified disorders of binocular movement: Secondary | ICD-10-CM | POA: Diagnosis present

## 2019-09-24 DIAGNOSIS — H53461 Homonymous bilateral field defects, right side: Secondary | ICD-10-CM | POA: Diagnosis present

## 2019-09-24 DIAGNOSIS — Z7982 Long term (current) use of aspirin: Secondary | ICD-10-CM | POA: Diagnosis not present

## 2019-09-24 DIAGNOSIS — E7800 Pure hypercholesterolemia, unspecified: Secondary | ICD-10-CM | POA: Diagnosis not present

## 2019-09-24 DIAGNOSIS — Z951 Presence of aortocoronary bypass graft: Secondary | ICD-10-CM | POA: Diagnosis not present

## 2019-09-24 DIAGNOSIS — I080 Rheumatic disorders of both mitral and aortic valves: Secondary | ICD-10-CM | POA: Diagnosis present

## 2019-09-24 DIAGNOSIS — R2681 Unsteadiness on feet: Secondary | ICD-10-CM | POA: Diagnosis not present

## 2019-09-24 DIAGNOSIS — I10 Essential (primary) hypertension: Secondary | ICD-10-CM | POA: Diagnosis not present

## 2019-09-24 DIAGNOSIS — I6523 Occlusion and stenosis of bilateral carotid arteries: Secondary | ICD-10-CM | POA: Diagnosis present

## 2019-09-24 DIAGNOSIS — I959 Hypotension, unspecified: Secondary | ICD-10-CM | POA: Diagnosis not present

## 2019-09-24 DIAGNOSIS — Z7951 Long term (current) use of inhaled steroids: Secondary | ICD-10-CM

## 2019-09-24 DIAGNOSIS — Z9841 Cataract extraction status, right eye: Secondary | ICD-10-CM

## 2019-09-24 DIAGNOSIS — R1310 Dysphagia, unspecified: Secondary | ICD-10-CM | POA: Diagnosis present

## 2019-09-24 DIAGNOSIS — I63233 Cerebral infarction due to unspecified occlusion or stenosis of bilateral carotid arteries: Secondary | ICD-10-CM | POA: Diagnosis not present

## 2019-09-24 DIAGNOSIS — R52 Pain, unspecified: Secondary | ICD-10-CM | POA: Diagnosis not present

## 2019-09-24 DIAGNOSIS — Z7401 Bed confinement status: Secondary | ICD-10-CM | POA: Diagnosis not present

## 2019-09-24 DIAGNOSIS — I63232 Cerebral infarction due to unspecified occlusion or stenosis of left carotid arteries: Secondary | ICD-10-CM | POA: Diagnosis not present

## 2019-09-24 DIAGNOSIS — G459 Transient cerebral ischemic attack, unspecified: Secondary | ICD-10-CM | POA: Diagnosis not present

## 2019-09-24 DIAGNOSIS — I6522 Occlusion and stenosis of left carotid artery: Secondary | ICD-10-CM | POA: Diagnosis not present

## 2019-09-24 DIAGNOSIS — I361 Nonrheumatic tricuspid (valve) insufficiency: Secondary | ICD-10-CM | POA: Diagnosis not present

## 2019-09-24 DIAGNOSIS — D631 Anemia in chronic kidney disease: Secondary | ICD-10-CM | POA: Diagnosis present

## 2019-09-24 DIAGNOSIS — I358 Other nonrheumatic aortic valve disorders: Secondary | ICD-10-CM | POA: Diagnosis present

## 2019-09-24 HISTORY — PX: IR PERCUTANEOUS ART THROMBECTOMY/INFUSION INTRACRANIAL INC DIAG ANGIO: IMG6087

## 2019-09-24 HISTORY — PX: IR CT HEAD LTD: IMG2386

## 2019-09-24 HISTORY — PX: RADIOLOGY WITH ANESTHESIA: SHX6223

## 2019-09-24 HISTORY — DX: Malignant neoplasm of prostate: C61

## 2019-09-24 LAB — I-STAT CHEM 8, ED
BUN: 18 mg/dL (ref 8–23)
Calcium, Ion: 1.18 mmol/L (ref 1.15–1.40)
Chloride: 105 mmol/L (ref 98–111)
Creatinine, Ser: 1.2 mg/dL (ref 0.61–1.24)
Glucose, Bld: 105 mg/dL — ABNORMAL HIGH (ref 70–99)
HCT: 37 % — ABNORMAL LOW (ref 39.0–52.0)
Hemoglobin: 12.6 g/dL — ABNORMAL LOW (ref 13.0–17.0)
Potassium: 4.6 mmol/L (ref 3.5–5.1)
Sodium: 139 mmol/L (ref 135–145)
TCO2: 25 mmol/L (ref 22–32)

## 2019-09-24 LAB — COMPREHENSIVE METABOLIC PANEL
ALT: 17 U/L (ref 0–44)
AST: 31 U/L (ref 15–41)
Albumin: 3.7 g/dL (ref 3.5–5.0)
Alkaline Phosphatase: 56 U/L (ref 38–126)
Anion gap: 11 (ref 5–15)
BUN: 15 mg/dL (ref 8–23)
CO2: 22 mmol/L (ref 22–32)
Calcium: 9.4 mg/dL (ref 8.9–10.3)
Chloride: 106 mmol/L (ref 98–111)
Creatinine, Ser: 1.29 mg/dL — ABNORMAL HIGH (ref 0.61–1.24)
GFR calc Af Amer: >60 mL/min (ref >60)
GFR calc non Af Amer: 54 mL/min — ABNORMAL LOW (ref >60)
Glucose, Bld: 108 mg/dL — ABNORMAL HIGH (ref 70–99)
Potassium: 4.6 mmol/L (ref 3.5–5.1)
Sodium: 139 mmol/L (ref 135–145)
Total Bilirubin: 0.9 mg/dL (ref 0.3–1.2)
Total Protein: 6.8 g/dL (ref 6.5–8.1)

## 2019-09-24 LAB — URINALYSIS, ROUTINE W REFLEX MICROSCOPIC
Bilirubin Urine: NEGATIVE
Glucose, UA: NEGATIVE mg/dL
Hgb urine dipstick: NEGATIVE
Ketones, ur: 5 mg/dL — AB
Nitrite: NEGATIVE
Protein, ur: NEGATIVE mg/dL
Specific Gravity, Urine: 1.043 — ABNORMAL HIGH (ref 1.005–1.030)
WBC, UA: >50 WBC/hpf — ABNORMAL HIGH (ref 0–5)
pH: 5 (ref 5.0–8.0)

## 2019-09-24 LAB — DIFFERENTIAL
Abs Immature Granulocytes: 0.06 10*3/uL (ref 0.00–0.07)
Basophils Absolute: 0 10*3/uL (ref 0.0–0.1)
Basophils Relative: 0 %
Eosinophils Absolute: 0.4 10*3/uL (ref 0.0–0.5)
Eosinophils Relative: 4 %
Immature Granulocytes: 1 %
Lymphocytes Relative: 11 %
Lymphs Abs: 1.2 10*3/uL (ref 0.7–4.0)
Monocytes Absolute: 0.7 10*3/uL (ref 0.1–1.0)
Monocytes Relative: 6 %
Neutro Abs: 8.1 10*3/uL — ABNORMAL HIGH (ref 1.7–7.7)
Neutrophils Relative %: 78 %

## 2019-09-24 LAB — CBC
HCT: 38.5 % — ABNORMAL LOW (ref 39.0–52.0)
Hemoglobin: 11.9 g/dL — ABNORMAL LOW (ref 13.0–17.0)
MCH: 28.8 pg (ref 26.0–34.0)
MCHC: 30.9 g/dL (ref 30.0–36.0)
MCV: 93.2 fL (ref 80.0–100.0)
Platelets: 405 10*3/uL — ABNORMAL HIGH (ref 150–400)
RBC: 4.13 MIL/uL — ABNORMAL LOW (ref 4.22–5.81)
RDW: 13.4 % (ref 11.5–15.5)
WBC: 10.4 10*3/uL (ref 4.0–10.5)
nRBC: 0 % (ref 0.0–0.2)

## 2019-09-24 LAB — RAPID URINE DRUG SCREEN, HOSP PERFORMED
Amphetamines: NOT DETECTED
Barbiturates: NOT DETECTED
Benzodiazepines: POSITIVE — AB
Cocaine: NOT DETECTED
Opiates: NOT DETECTED
Tetrahydrocannabinol: NOT DETECTED

## 2019-09-24 LAB — PROTIME-INR
INR: 1.1 (ref 0.8–1.2)
Prothrombin Time: 13.9 seconds (ref 11.4–15.2)

## 2019-09-24 LAB — MRSA PCR SCREENING: MRSA by PCR: NEGATIVE

## 2019-09-24 LAB — APTT: aPTT: 28 seconds (ref 24–36)

## 2019-09-24 LAB — CBG MONITORING, ED: Glucose-Capillary: 99 mg/dL (ref 70–99)

## 2019-09-24 LAB — ETHANOL: Alcohol, Ethyl (B): <10 mg/dL (ref <10)

## 2019-09-24 LAB — GLUCOSE, CAPILLARY
Glucose-Capillary: 103 mg/dL — ABNORMAL HIGH (ref 70–99)
Glucose-Capillary: 81 mg/dL (ref 70–99)
Glucose-Capillary: 98 mg/dL (ref 70–99)
Glucose-Capillary: 99 mg/dL (ref 70–99)

## 2019-09-24 LAB — SARS CORONAVIRUS 2 BY RT PCR (HOSPITAL ORDER, PERFORMED IN ~~LOC~~ HOSPITAL LAB): SARS Coronavirus 2: NEGATIVE

## 2019-09-24 SURGERY — IR WITH ANESTHESIA
Anesthesia: General

## 2019-09-24 MED ORDER — GLYCOPYRROLATE 0.2 MG/ML IJ SOLN
INTRAMUSCULAR | Status: DC | PRN
Start: 2019-09-24 — End: 2019-09-24
  Administered 2019-09-24: .2 mg via INTRAVENOUS

## 2019-09-24 MED ORDER — CLOPIDOGREL BISULFATE 300 MG PO TABS
ORAL_TABLET | ORAL | Status: AC
  Filled 2019-09-24: qty 1

## 2019-09-24 MED ORDER — MIDAZOLAM HCL 2 MG/2ML IJ SOLN
INTRAMUSCULAR | Status: AC
  Administered 2019-09-24: 2 mg
  Filled 2019-09-24: qty 4

## 2019-09-24 MED ORDER — ACETAMINOPHEN 650 MG RE SUPP: 650.0000 mg | RECTAL | Status: DC | PRN

## 2019-09-24 MED ORDER — CLEVIDIPINE BUTYRATE 0.5 MG/ML IV EMUL
0.0000 mg/h | INTRAVENOUS | Status: DC
  Administered 2019-09-24: 2 mg/h via INTRAVENOUS
  Administered 2019-09-24: 5 mg/h via INTRAVENOUS
  Administered 2019-09-25: 18 mg/h via INTRAVENOUS
  Administered 2019-09-25: 19 mg/h via INTRAVENOUS
  Administered 2019-09-25: 18 mg/h via INTRAVENOUS
  Filled 2019-09-24 (×5): qty 50
  Filled 2019-09-24: qty 100
  Filled 2019-09-24: qty 50

## 2019-09-24 MED ORDER — SODIUM CHLORIDE 0.9 % IV SOLN
50.0000 mL | Freq: Once | INTRAVENOUS | Status: AC
  Administered 2019-09-24: 50 mL via INTRAVENOUS

## 2019-09-24 MED ORDER — TIROFIBAN HCL IN NACL 5-0.9 MG/100ML-% IV SOLN
INTRAVENOUS | Status: AC
  Filled 2019-09-24: qty 100

## 2019-09-24 MED ORDER — VERAPAMIL HCL 2.5 MG/ML IV SOLN
INTRAVENOUS | Status: AC
  Filled 2019-09-24: qty 2

## 2019-09-24 MED ORDER — LORAZEPAM 2 MG/ML IJ SOLN
INTRAMUSCULAR | Status: AC
  Filled 2019-09-24: qty 1

## 2019-09-24 MED ORDER — INSULIN ASPART 100 UNIT/ML ~~LOC~~ SOLN
2.0000 [IU] | SUBCUTANEOUS | Status: DC
  Administered 2019-09-25: 4 [IU] via SUBCUTANEOUS

## 2019-09-24 MED ORDER — SODIUM CHLORIDE 0.9 % IV SOLN: INTRAVENOUS | Status: DC

## 2019-09-24 MED ORDER — CHLORHEXIDINE GLUCONATE CLOTH 2 % EX PADS
6.0000 | MEDICATED_PAD | Freq: Every day | CUTANEOUS | Status: DC
  Administered 2019-09-25 – 2019-10-06 (×10): 6 via TOPICAL

## 2019-09-24 MED ORDER — PANTOPRAZOLE SODIUM 40 MG IV SOLR
40.0000 mg | Freq: Every day | INTRAVENOUS | Status: DC
  Administered 2019-09-24 – 2019-09-25 (×2): 40 mg via INTRAVENOUS
  Filled 2019-09-24 (×2): qty 40

## 2019-09-24 MED ORDER — NITROGLYCERIN 1 MG/10 ML FOR IR/CATH LAB
INTRA_ARTERIAL | Status: AC
  Filled 2019-09-24: qty 10

## 2019-09-24 MED ORDER — IOHEXOL 300 MG/ML  SOLN
150.0000 mL | Freq: Once | INTRAMUSCULAR | Status: AC | PRN
  Administered 2019-09-24: 80 mL via INTRA_ARTERIAL

## 2019-09-24 MED ORDER — SUCCINYLCHOLINE CHLORIDE 200 MG/10ML IV SOSY
PREFILLED_SYRINGE | INTRAVENOUS | Status: DC | PRN
  Administered 2019-09-24: 100 mg via INTRAVENOUS

## 2019-09-24 MED ORDER — IOHEXOL 240 MG/ML SOLN
INTRAMUSCULAR | Status: AC
  Filled 2019-09-24: qty 200

## 2019-09-24 MED ORDER — ACETAMINOPHEN 325 MG PO TABS: 650.0000 mg | ORAL_TABLET | ORAL | Status: DC | PRN

## 2019-09-24 MED ORDER — SUGAMMADEX SODIUM 200 MG/2ML IV SOLN
INTRAVENOUS | Status: DC | PRN
  Administered 2019-09-24: 200 mg via INTRAVENOUS

## 2019-09-24 MED ORDER — ALTEPLASE (STROKE) FULL DOSE INFUSION
0.9000 mg/kg | Freq: Once | INTRAVENOUS | Status: AC
  Administered 2019-09-24: 58.8 mg via INTRAVENOUS
  Filled 2019-09-24: qty 100

## 2019-09-24 MED ORDER — LIDOCAINE 2% (20 MG/ML) 5 ML SYRINGE
INTRAMUSCULAR | Status: DC | PRN
  Administered 2019-09-24: 50 mg via INTRAVENOUS

## 2019-09-24 MED ORDER — PROPOFOL 10 MG/ML IV BOLUS
INTRAVENOUS | Status: DC | PRN
  Administered 2019-09-24: 100 mg via INTRAVENOUS

## 2019-09-24 MED ORDER — CEFAZOLIN SODIUM-DEXTROSE 2-3 GM-%(50ML) IV SOLR
INTRAVENOUS | Status: DC | PRN
Start: 2019-09-24 — End: 2019-09-24
  Administered 2019-09-24: 2 g via INTRAVENOUS

## 2019-09-24 MED ORDER — ONDANSETRON HCL 4 MG/2ML IJ SOLN
INTRAMUSCULAR | Status: DC | PRN
  Administered 2019-09-24: 4 mg via INTRAVENOUS

## 2019-09-24 MED ORDER — FENTANYL CITRATE (PF) 250 MCG/5ML IJ SOLN
INTRAMUSCULAR | Status: DC | PRN
  Administered 2019-09-24: 50 ug via INTRAVENOUS

## 2019-09-24 MED ORDER — ACETAMINOPHEN 160 MG/5ML PO SOLN: 650.0000 mg | ORAL | Status: DC | PRN

## 2019-09-24 MED ORDER — EPTIFIBATIDE 20 MG/10ML IV SOLN
INTRAVENOUS | Status: AC
  Filled 2019-09-24: qty 10

## 2019-09-24 MED ORDER — LEVOTHYROXINE SODIUM 75 MCG PO TABS
75.0000 ug | ORAL_TABLET | Freq: Every day | ORAL | Status: DC
  Administered 2019-09-27 – 2019-10-07 (×10): 75 ug via ORAL
  Filled 2019-09-24 (×4): qty 1
  Filled 2019-09-24: qty 3
  Filled 2019-09-24 (×8): qty 1

## 2019-09-24 MED ORDER — IOHEXOL 300 MG/ML  SOLN
50.0000 mL | Freq: Once | INTRAMUSCULAR | Status: AC | PRN
  Administered 2019-09-24: 25 mL via INTRA_ARTERIAL

## 2019-09-24 MED ORDER — SENNOSIDES-DOCUSATE SODIUM 8.6-50 MG PO TABS
1.0000 | ORAL_TABLET | Freq: Every evening | ORAL | Status: DC | PRN
  Administered 2019-10-05: 1 via ORAL
  Filled 2019-09-24: qty 1

## 2019-09-24 MED ORDER — PHENYLEPHRINE HCL-NACL 10-0.9 MG/250ML-% IV SOLN
INTRAVENOUS | Status: DC | PRN
Start: 2019-09-24 — End: 2019-09-24
  Administered 2019-09-24: 30 ug/min via INTRAVENOUS

## 2019-09-24 MED ORDER — ACETAMINOPHEN 650 MG RE SUPP
650.0000 mg | RECTAL | Status: DC | PRN
  Administered 2019-09-24: 650 mg via RECTAL
  Filled 2019-09-24: qty 1

## 2019-09-24 MED ORDER — LABETALOL HCL 5 MG/ML IV SOLN
20.0000 mg | Freq: Once | INTRAVENOUS | Status: DC
  Filled 2019-09-24: qty 4

## 2019-09-24 MED ORDER — ROCURONIUM BROMIDE 10 MG/ML (PF) SYRINGE
PREFILLED_SYRINGE | INTRAVENOUS | Status: DC | PRN
  Administered 2019-09-24: 60 mg via INTRAVENOUS
  Administered 2019-09-24: 10 mg via INTRAVENOUS

## 2019-09-24 MED ORDER — STROKE: EARLY STAGES OF RECOVERY BOOK
Freq: Once | Status: AC
  Filled 2019-09-24: qty 1

## 2019-09-24 MED ORDER — EPHEDRINE SULFATE-NACL 50-0.9 MG/10ML-% IV SOSY
PREFILLED_SYRINGE | INTRAVENOUS | Status: DC | PRN
  Administered 2019-09-24: 5 mg via INTRAVENOUS

## 2019-09-24 MED ORDER — SODIUM CHLORIDE (PF) 0.9 % IJ SOLN
INTRAVENOUS | Status: AC | PRN
  Administered 2019-09-24: 25 ug via INTRA_ARTERIAL

## 2019-09-24 MED ORDER — FENTANYL CITRATE (PF) 100 MCG/2ML IJ SOLN
INTRAMUSCULAR | Status: AC
  Filled 2019-09-24: qty 2

## 2019-09-24 MED ORDER — ASPIRIN 81 MG PO CHEW
CHEWABLE_TABLET | ORAL | Status: AC
  Filled 2019-09-24: qty 1

## 2019-09-24 MED ORDER — TICAGRELOR 90 MG PO TABS
ORAL_TABLET | ORAL | Status: AC
  Filled 2019-09-24: qty 2

## 2019-09-24 MED ORDER — ACETAMINOPHEN 325 MG PO TABS
650.0000 mg | ORAL_TABLET | ORAL | Status: DC | PRN
  Administered 2019-10-01: 650 mg via ORAL
  Filled 2019-09-24: qty 2

## 2019-09-24 MED ORDER — CLEVIDIPINE BUTYRATE 0.5 MG/ML IV EMUL
INTRAVENOUS | Status: AC
  Administered 2019-09-24: 13 mg/h via INTRAVENOUS
  Filled 2019-09-24: qty 50

## 2019-09-24 MED ORDER — IOHEXOL 350 MG/ML SOLN
75.0000 mL | Freq: Once | INTRAVENOUS | Status: AC | PRN
  Administered 2019-09-24: 75 mL via INTRAVENOUS

## 2019-09-24 MED ORDER — CLEVIDIPINE BUTYRATE 0.5 MG/ML IV EMUL: 0.0000 mg/h | INTRAVENOUS | Status: DC

## 2019-09-24 NOTE — Progress Notes (Signed)
In a pt belonging bag, contents include: clothing, shoes, pen, belt, glasses, keys, partial lower, wallet with various cards and $31 cash.

## 2019-09-24 NOTE — Transfer of Care (Signed)
Immediate Anesthesia Transfer of Care Note  Patient: Gerald Rosario  Procedure(s) Performed: IR WITH ANESTHESIA (N/A )  Patient Location: PACU  Anesthesia Type:General  Level of Consciousness: awake  Airway & Oxygen Therapy: Patient Spontanous Breathing  Post-op Assessment: Report given to RN, Post -op Vital signs reviewed and stable and Patient moving all extremities X 4  Patient now moving both right leg and arm.  Still aphasic, restless.  abp 155/50- continued cleveprex and given 8mcg fentanyl   Post vital signs: Reviewed and stable  Last Vitals:  Vitals Value Taken Time  BP 123/62 09/24/19 1316  Temp    Pulse 63 09/24/19 1322  Resp 11 09/24/19 1322  SpO2 95 % 09/24/19 1322  Vitals shown include unvalidated device data.  Last Pain: There were no vitals filed for this visit.       Complications: No complications documented.

## 2019-09-24 NOTE — H&P (Addendum)
Neurology history and physical    CC: Stroke  History is obtained from: EMS  HPI: Gerald Rosario is a 74 y.o. male with history of prostate cancer, CABG x3, PAF, ischemic cardiomyopathy, hypertension, hyperlipidemia, diabetes and CAD.  Patient was at his primary care physician's office today to further evaluate episodes of losing strength in all extremities.  While he was at the primary care office patient had sudden onset of left gaze deviation and right-sided weakness.  PCP believe that he might be having a seizure.  EMS was called.  By the time EMS arrived patient was back to baseline.  Upon arriving at emergency department patient again had sudden onset of left eye deviation, aphasia, right-sided weakness and right facial droop.  During exam patient continued to have these findings.  As last seen normal was 10:30 AM he was in the window for TPA administration.  Patient had no contraindications thus TPA was administered.  CTA of head did show a left ICA occlusion along with left M1 occlusion thus patient was immediately brought to the interventional suite.  ED course   CT head shows-hyperdense left MCA compatible with acute thrombus  CT angio head neck-acute occlusion proximal left internal carotid artery.  Left internal carotid artery is occluded through the supraclinoid segment with reconstruction via collaterals.  There is extensive atherosclerotic calcification in the cavernous carotid bilaterally.  There is an abrupt occlusion of the left M1 segment compatible with acute thrombus.  There is poor collateral circulation left MCA territory.  40% diameter stenosis proximal right internal carotid artery.  Mild to moderate stenosis distal vertebral artery.  Chart review no other neurological charts  LKW: 10:30 AM tpa given?:  Yes Premorbid modified Rankin scale (mRS): 0 NIH stroke screen:    Past Medical History:  Diagnosis Date  . Asthma    start dulera 100 April 12,2011 > better but  "knot in throat" so try qvar June 7,2011 > preferred dulera. HFA 90% May 10,2011 > 90% October 17,2011. PFT's June 7,2011 wnl x minimal nonspecific mid flow reduction while on dulera. Changed to advair intermediate strength October 17,2011 due to ins issue  . CAD (coronary artery disease)    a. severe multivessel CAD s/p STEMI (2012) with severe ICM (EF 20-25%) now improved to 55-60%    b. 08/2013 s/p CABG 3 with LIMA-LAD, SVG-RI, SVG-D1  c. 01/2014 NSTEMI  s/p DES to SVG-RI  . Chronic kidney disease   . Chronic systolic heart failure ([place])   . Diabetes mellitus   . Dyspnea   . Enlarged prostate   . Enlarged prostate   . H/O hyperkalemia   . Hearing loss   . HLD (hyperlipidemia)   . Hoarseness 12-20-11   onset 11/09. neg w/u 09/2008. saw Dr. Raelene Bott. 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 Hx of detached retina repair    a. @ [facility]; Right Eye  . Hyperlipidemia   . Hypertension   . HYPERTENSION   . Ischemic cardiomyopathy    Repeat Cardiac MRI - EF 52%, distal Septal & apical Akinesis (suggest scar), unable to assess viability due to patient movement  . Multiple fractures of ribs of left side   . PAF (paroxysmal atrial fibrillation) ([place])    a. post CABG  . Prostate cancer ([place]) 09/24/2019  . Pulmonary nodule   . PULMONARY NODULE, RIGHT MIDDLE LOBE   . 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 and clipping of LA appendage  a. LIMA-LAD, SVG-RI, SVG-D1  . Syncope   . Syncope and collapse   . Vocal cord dysfunction      Family History  Problem Relation Age of Onset  . Cancer Mother        Colon  . Heart attack Father        died MI 51   Social History:   reports that he quit smoking about 38 years ago. His smoking use included cigarettes. He has a 36.00 pack-year smoking history. He has never used smokeless tobacco. He reports current alcohol use. He reports that he does not use drugs.  Medications  Current Facility-Administered Medications:  .    stroke: mapping our early stages of recovery book, , Does not apply, Once, Marliss Coots, PA-C .  alteplase (ACTIVASE) 1 mg/mL infusion 58.8 mg, 0.9 mg/kg, Intravenous, Once, Last Rate: 52.9 mL/hr at 09/24/19 1102, 58.8 mg at 09/24/19 1102 **FOLLOWED BY** 0.9 %  sodium chloride infusion, 50 mL, Intravenous, Once, [name] R, MD .  0.9 %  sodium chloride infusion, , Intravenous, Continuous, Marliss Coots, PA-C .  acetaminophen (TYLENOL) tablet 650 mg, 650 mg, Oral, Q4H PRN **OR** acetaminophen (TYLENOL) 160 MG/5ML solution 650 mg, 650 mg, Per Tube, Q4H PRN **OR** acetaminophen (TYLENOL) suppository 650 mg, 650 mg, Rectal, Q4H PRN, Marliss Coots, PA-C .  labetalol (NORMODYNE) injection 20 mg, 20 mg, Intravenous, Once **AND** clevidipine (CLEVIPREX) infusion 0.5 mg/mL, 0-21 mg/hr, Intravenous, Continuous, Marliss Coots, PA-C .  insulin aspart (novoLOG) injection 2-6 Units, 2-6 Units, Subcutaneous, Q4H, Marliss Coots, PA-C .  [START ON 09/25/2019] levothyroxine (SYNTHROID) tablet 25 mcg, 25 mcg, Oral, QAC breakfast, Marliss Coots, PA-C .  LORazepam (ATIVAN) 2 MG/ML injection, , , ,  .  nitroGLYCERIN 100 mcg/mL intra-arterial injection, , , ,  .  pantoprazole (PROTONIX) injection 40 mg, 40 mg, Intravenous, QHS, Marliss Coots, PA-C .  senna-docusate (Senokot-S) tablet 1 tablet, 1 tablet, Oral, QHS PRN, Marliss Coots, PA-C  Current Outpatient Medications:  .  Accu-Chek FastClix Lancets MISC, USE TO CHECK BLOOD SUGARS THREE TIMES DAILY, Disp: 306 each, Rfl: 0 .  ALBUTEROL SULFATE HFA IN, INHALE 1 PUFF BY MOUTH FOUR TIMES A DAY AS NEEDED COUGH/WHEEZING, Disp: , Rfl:  .  ascorbic acid (VITAMIN C) 500 MG tablet, Take 500 mg by mouth daily., Disp: , Rfl:  .  aspirin 81 MG tablet, Take 1 tablet (81 mg total) by mouth every morning., Disp: 30 tablet, Rfl:  .  atorvastatin (LIPITOR) 40 MG tablet, Take 1 tablet (40 mg total) by mouth daily., Disp: 90 tablet, Rfl: 3 .  budesonide-formoterol  (SYMBICORT) 80-4.5 MCG/ACT inhaler, Take 2 puffs first thing in am and then another 2 puffs about 12 hours later., Disp: 1 Inhaler, Rfl: 11 .  carboxymethylcellulose 1 % ophthalmic solution, 1 drop 3 (three) times daily., Disp: , Rfl:  .  Coenzyme Q10 200 MG capsule, Take 200 mg by mouth daily. , Disp: , Rfl:  .  FERROUS FUMARATE PO, TAKE ONE TABLET BY MOUTH MONDAY, WEDNESDAY AND FRIDAY FOR LOW IRON, Disp: , Rfl:  .  finasteride (PROSCAR) 5 MG tablet, Take 5 mg by mouth 2 (two) times a day., Disp: , Rfl:  .  fish oil-omega-3 fatty acids 1000 MG capsule, Take 1 g by mouth 2 (two) times daily. , Disp: , Rfl:  .  glipiZIDE (GLUCOTROL) 10 MG tablet, Take by mouth., Disp: , Rfl:  .  glipiZIDE (GLUCOTROL) 5 MG tablet,  Take 1.5 tablets (7.5 mg total) by mouth every evening. (Patient taking differently: Take 10 mg by mouth every evening. ), Disp: 135 tablet, Rfl: 3 .  Glucosamine-Chondroitin (OSTEO BI-FLEX REGULAR STRENGTH PO), Take 1 capsule by mouth 2 (two) times daily. , Disp: , Rfl:  .  glucose blood (ACCU-CHEK AVIVA PLUS) test strip, 1 each by Other route 2 (two) times daily. Dx code: E11.9, Disp: 200 each, Rfl: 1 .  Ipratropium-Albuterol (COMBIVENT RESPIMAT) 20-100 MCG/ACT AERS respimat, Inhale 1 puff into the lungs every 6 (six) hours as needed for wheezing., Disp: , Rfl:  .  IRON PO, Take 1 tablet by mouth 3 (three) times a week. , Disp: , Rfl:  .  levothyroxine (SYNTHROID, LEVOTHROID) 25 MCG tablet, Take 1 tablet (25 mcg total) by mouth daily before breakfast., Disp: 90 tablet, Rfl: 1 .  metoprolol tartrate (LOPRESSOR) 50 MG tablet, Take 50 mg by mouth 2 (two) times daily., Disp: , Rfl:  .  Multiple Vitamin (MULTIVITAMIN) tablet, Take 1 tablet by mouth daily.  , Disp: , Rfl:  .  Multiple Vitamins-Minerals (OCUVITE PRESERVISION PO), Take 1 each by mouth daily., Disp: , Rfl:  .  Omega-3 Fatty Acids (FISH OIL) 1000 MG CAPS, Take 1 each by mouth daily., Disp: , Rfl:  .  oxymetazoline (AFRIN) 0.05 %  nasal spray, Place 1 spray into both nostrils 2 (two) times daily., Disp: , Rfl:  .  pantoprazole (PROTONIX) 40 MG tablet, Take 20 mg by mouth daily. , Disp: , Rfl:  .  prednisoLONE acetate (PRED FORTE) 1 % ophthalmic suspension, INSTILL 3 DROPS AT BEDTIME ** FOR NASAL USE: APPLY 3 DROPS TO EACH NOSTRIL IN HEAD HANGING POSITION  NIGHTLY ** FOR NASAL USE: APPLY 3 DROPS TO EACH NOSTRIL IN HEAD HANGING POSITION   NIGHTLY, Disp: , Rfl:  .  Red Yeast Rice Extract (RED YEAST RICE PO), Take 2 tablets by mouth at bedtime. Pt unsure of dosage, Disp: , Rfl:  .  tamsulosin (FLOMAX) 0.4 MG CAPS capsule, Take 1 capsule (0.4 mg total) by mouth daily. (Patient taking differently: Take 0.4 mg by mouth 2 (two) times a day. ), Disp: 30 capsule, Rfl:  .  traMADol (ULTRAM) 50 MG tablet, Take 50 mg by mouth every 6 (six) hours as needed for pain., Disp: , Rfl:  .  TRAZODONE HCL PO, Take 1 each by mouth at bedtime., Disp: , Rfl:  .  UNABLE TO FIND, Prevegen, Disp: , Rfl:   ROS:   Unable to obtain due to patient is mute at this time.    Exam: Current vital signs: BP (!) 162/63   Pulse 65   SpO2 100%  Vital signs in last 24 hours: Temp:  [98.4 F (36.9 C)] 98.4 F (36.9 C) (06/23 0904) Pulse Rate:  [64-65] 65 (06/23 1050) BP: (110-162)/(60-63) 162/63 (06/23 1050) SpO2:  [99 %-100 %] 100 % (06/23 1050) Weight:  [65.3 kg] 65.3 kg (06/23 0904)   Constitutional: Appears well-developed and well-nourished.  Eyes: No scleral injection HENT: No OP obstrucion Head: Normocephalic.  Cardiovascular: Palpable Respiratory: Effort normal, non-labored breathing GI: Soft.  No distension. There is no tenderness.  Skin: WDI  Neuro: Mental Status: Patient currently is mute, not following commands, agitated. Cranial Nerves: II: Right hemianopsia III,IV, VI: Left forced gaze deviation. Pupils equal, round and reactive to light VII: Right facial droop.  XI: Shoulder shrug is symmetric. XII: tongue is midline without  atrophy or fasciculations.  Motor: Tone is normal.  Right arm  is flaccid, left arm 5/5 Drift-positive bilateral lower extremity right greater than left  Sensory: No sensation on the right arm and leg. Deep Tendon Reflexes: 2+ and symmetric in the biceps and patellae.  Plantars: Upgoing on the right and downgoing on the left Cerebellar: Unable to obtain  Labs I have reviewed labs in epic and the results pertinent to this consultation are:   CBC    Component Value Date/Time   WBC 8.0 03/21/2019 0928   RBC 4.25 03/21/2019 0928   HGB 12.6 (L) 09/24/2019 1052   HCT 37.0 (L) 09/24/2019 1052   PLT 448.0 (H) 03/21/2019 0928   MCV 89.5 03/21/2019 0928   MCH 27.2 01/27/2014 0352   MCHC 33.1 03/21/2019 0928   RDW 14.2 03/21/2019 0928   LYMPHSABS 1.3 03/21/2019 0928   MONOABS 0.7 03/21/2019 0928   EOSABS 0.7 03/21/2019 0928   BASOSABS 0.1 03/21/2019 0928    CMP     Component Value Date/Time   NA 139 09/24/2019 1052   K 4.6 09/24/2019 1052   CL 105 09/24/2019 1052   CO2 27 03/21/2019 0928   GLUCOSE 105 (H) 09/24/2019 1052   BUN 18 09/24/2019 1052   CREATININE 1.20 09/24/2019 1052   CREATININE 1.18 11/20/2013 1224   CALCIUM 9.4 03/21/2019 0928   PROT 7.1 03/21/2019 0928   ALBUMIN 4.0 03/21/2019 0928   AST 21 03/21/2019 0928   ALT 15 03/21/2019 0928   ALKPHOS 61 03/21/2019 0928   BILITOT 0.5 03/21/2019 0928   GFRNONAA 57 (L) 01/27/2014 0352   GFRNONAA 63 11/20/2013 1224   GFRAA 66 (L) 01/27/2014 0352   GFRAA 72 11/20/2013 1224    Lipid Panel     Component Value Date/Time   CHOL 133 03/21/2019 0928   TRIG 44.0 03/21/2019 0928   HDL 55.80 03/21/2019 0928   CHOLHDL 2 03/21/2019 0928   VLDL 8.8 03/21/2019 0928   LDLCALC 69 03/21/2019 0928   LDLDIRECT 132.0 12/09/2010 1221     Imaging I have reviewed the images obtained:  CT head shows-hyperdense left MCA compatible with acute thrombus  CT angio head neck-acute occlusion proximal left internal carotid artery.   Left internal carotid artery is occluded through the supraclinoid segment with reconstruction via collaterals.  There is extensive atherosclerotic calcification in the cavernous carotid bilaterally.  There is an abrupt occlusion of the left M1 segment compatible with acute thrombus.  There is poor collateral circulation left MCA territory.  40% diameter stenosis proximal right internal carotid artery.  Mild to moderate stenosis distal vertebral artery.  Etta Quill PA-C [facility] [phone]  M-F  (9:00 am- 5:00 PM)  09/24/2019, 11:24 AM     Assessment:  This is a 74 year old male with multiple risk factors for stroke.  Patient was found at PCP office with sudden onset of left gaze deviation and right-sided weakness.  EMS was called.  Patient resolved however upon arrival to ED patient suddenly had left eye deviation along with right arm and leg flaccidity and mute.  CTA confirmed a occluded left ICA and left M1 cutoff.    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 -Hyperglycemia management per SSI to maintain glucose 140-180mg /dL. -PT/OT/ST  therapies and recommendations when able  CNS -Close neuro monitoring -NPO until cleared by speech -ST -Advance diet as tolerated  Hemiplegia and hemiparesis following cerebral infarction affecting right dominant side -PT/OT  RESP No active issues  CV -Aggressive BP control, goal SBP <180 -If able to swallow initiate beta-blocker, if needs further blood pressure management titrate IV antihypertensive -Monitor chronic systolic (congestive) heart failure  -TTE - Statin  for goal LDL < 70  Paroxysmal atrial fibrillation -Rate control -Continue BB.   HEME -Currently no active issues -Monitor -transfuse for hgb < 7  ENDO -SSI -goal HgbA1c < 7  GI/GU -Gentle hydration   Fluid/Electrolyte Disorders -Replete with normal saline -Repeat labs  Prophylaxis DVT: SCD GI: Protonix Bowel: Senokot  Diet: NPO until cleared by speech  Code Status: Full Code    NEUROHOSPITALIST ADDENDUM Performed a face to face diagnostic evaluation.   I have reviewed the contents of history and physical exam as documented by PA/ARNP/Resident and agree with above documentation.  I have discussed and formulated the above plan as documented. Edits to the note have been made as needed.  74 y.o. male with history of prostate cancer, CABG x3, PAF, ischemic cardiomyopathy, hypertension, hyperlipidemia, diabetes and CAD who presented earlier in the day to his PCPs office for symptoms of sudden onset generalized weakness/loss of tone-was being transported to the ED for evaluation when suddenly patient became aphasic and right-sided weakness and leftward gaze noted by ED transport.  Code stroke immediately activated and arrival patient has left gaze deviation, aphasia, right-sided weakness-initial NIH stroke scale performed by myself 25.  Stat CT head showed no hemorrhage however showed hyperdense left MCA.  Patient on anticoagulation, no recent surgery no other contraindications so therefore IV TPA was administered.  Spoke to patient's daughter over the phone who is ICU nurse at [place] to him that he is having a large vessel occlusion and he needs to be taken emergently to IR for mechanical thrombectomy and possibly emergent carotid stenting. Daughter is aware of the risk of hemorrhage, consented for procedure over the phone. Discussed with interventional neuroradiologist-Dr. Patrecia Pour and patient was taken for emergent endovascular revascularization.   This  patient is neurologically critically ill due to acute left MCA ischemic stroke. He is at risk for significant risk of neurological worsening from cerebral edema,  death from brain herniation, heart failure, hemorrhagic conversion, infection, respiratory failure and seizure. This patient's care requires constant monitoring of vital signs, hemodynamics, respiratory and cardiac monitoring, review of multiple databases, neurological assessment, discussion with family, other specialists and medical decision making of high complexity.  I spent 55 minutes of neurocritical time in the care of this patient.       Karena Addison Tymeer Vaquera MD [facility] [phone]   If 7pm to 7am, please call on call as listed on AMION.

## 2019-09-24 NOTE — Progress Notes (Signed)
Care turned over to anesthesia

## 2019-09-24 NOTE — Progress Notes (Signed)
Patient ID: Gerald Rosario, male   DOB: 21-Apr-1945, 74 y.o.   MRN: [id number] INR. 73 Y RT H M LSW  10 30 am. MRSS 0. New onset Lt gaze deviation and RT sided hemiplegia. CT brain No ICH ASPECTS ? 7 -8 CTA occluded Lt ICA prox and intracranially and Lt MCA to the inf division. Given IVTPA. Endovascular treatment D/W daughter . Procedure,reasons and ralternatives reviewed. Risks of ICH of 10 % ,worsening neuro function,death and inability to revascularize reviewed. She expressed understanding and provided consent to proceed with the treatment. S.Senta Kantor MD

## 2019-09-24 NOTE — Assessment & Plan Note (Signed)
Acute New onset weakness from this morning Having episodes of lightheadedness-2 earlier than today associated with all extremity weakness-this morning the episode was quite severe and he collapsed without obvious loss of consciousness, but some confusion, loss of urine He states he felt he did not have control over any of his extremities Has improved since this morning, but still not quite normal Possible right lower extremity weakness, but he also states he may have injured his knee Still having some lightheadedness, possible confusion Emergent evaluation in the ED

## 2019-09-24 NOTE — Progress Notes (Signed)
  Echocardiogram 2D Echocardiogram has been attempted. Patient not in room. Will reattempt at later time.  Jacy Howat G Nolen Lindamood 09/24/2019, 12:00 PM

## 2019-09-24 NOTE — Progress Notes (Signed)
Pharmacist Code Stroke Response  Notified to mix tPA at 1057 by Dr. Lorraine Lax Delivered tPA to RN at 1100  tPA dose = 5.9mg  bolus over 1 minute followed by 52.9mg  for a total dose of 58.8mg  over 1 hour  Issues/delays encountered (if applicable): N/A  Gerald Rosario, Gerald Rosario 09/24/19 11:01 AM
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
## 2019-09-24 NOTE — Anesthesia Procedure Notes (Signed)
Procedure Name: Intubation Date/Time: 09/24/2019 11:20 AM Performed by: Shirlyn Goltz, CRNA Pre-anesthesia Checklist: Patient identified, Emergency Drugs available, Suction available and Patient being monitored Patient Re-evaluated:Patient Re-evaluated prior to induction Oxygen Delivery Method: Circle system utilized Preoxygenation: Pre-oxygenation with 100% oxygen Induction Type: IV induction, Rapid sequence and Cricoid Pressure applied Laryngoscope Size: Glidescope and 4 (no covid results yet) Grade View: Grade I Tube type: Oral Tube size: 7.5 mm Number of attempts: 1 Airway Equipment and Method: Video-laryngoscopy and Rigid stylet Placement Confirmation: ETT inserted through vocal cords under direct vision,  positive ETCO2 and breath sounds checked- equal and bilateral Secured at: 22 cm Tube secured with: Tape Dental Injury: Teeth and Oropharynx as per pre-operative assessment

## 2019-09-24 NOTE — Procedures (Addendum)
S/P LT common carotid artrriogram RT CFA approach. Findings. . S/P revascularization of occluded Lt ICA intracranially and extracranially and LT MCA to the inferior division ,and Lt ACA prox with x1 pass with solitaireX 13mmx 40 mm and x1 pass with embotrap 42mm x 37 mm and penumbra aspiration achieving a TICI 3 revascularization. POst CT brain heg for hemorrhage or  mass effect. 76F angioseal for hemostasis at the RT CFA puncture site. Distal pulses  All dopplerable bilaterally. Extubated. Moves Lt A and L spontaneously. Attempts to move RT L . No movement in the RT A. Pupils RT  2 to 3 mm irregular, and Lt 1 to 2 mm regular. Sluggishly reactive. S.Yanixan Mellinger MD

## 2019-09-24 NOTE — Progress Notes (Signed)
Patient taken to bay 7 of PACU.   Report given to [facility] in PACU,  Report given by myself and [place] CRNA.  Pt Right pupil 2-3 cm and Left 1-2 cm,  Per Dr.  Estanislado Pandy
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
## 2019-09-24 NOTE — Anesthesia Preprocedure Evaluation (Signed)
Anesthesia Evaluation  Patient identified by MRN, date of birth, ID bandPreop documentation limited or incomplete due to emergent nature of procedure.  Airway Mallampati: II   Neck ROM: full    Dental   Pulmonary shortness of breath, asthma , former smoker,    breath sounds clear to auscultation       Cardiovascular hypertension, + CAD, + Past MI and + CABG   Rhythm:regular Rate:Normal     Neuro/Psych  Neuromuscular disease CVA    GI/Hepatic GERD  ,  Endo/Other  diabetes, Type 2Hypothyroidism   Renal/GU      Musculoskeletal   Abdominal   Peds  Hematology   Anesthesia Other Findings   Reproductive/Obstetrics                             Anesthesia Physical Anesthesia Plan  ASA: IV and emergent  Anesthesia Plan: General   Post-op Pain Management:    Induction: Intravenous  PONV Risk Score and Plan: 2 and Ondansetron, Dexamethasone and Treatment may vary due to age or medical condition  Airway Management Planned: Oral ETT  Additional Equipment: Arterial line  Intra-op Plan:   Post-operative Plan: Possible Post-op intubation/ventilation  Informed Consent: I have reviewed the patients History and Physical, chart, labs and discussed the procedure including the risks, benefits and alternatives for the proposed anesthesia with the patient or authorized representative who has indicated his/her understanding and acceptance.       Plan Discussed with: CRNA, Anesthesiologist and Surgeon  Anesthesia Plan Comments:         Anesthesia Quick Evaluation

## 2019-09-24 NOTE — Assessment & Plan Note (Signed)
Acute Collapse this morning-unwitnessed He does not think he lost consciousness, but there was some confusion, extremity weakness and loss of urine He states he did not have control over any of his extremities, but has some residual right lower extremity weakness-unsure if this is related to injuring his knee or the leg is weak Emergent evaluation in the emergency room-we will be transporting by EMS-most concerning new onset seizures, stroke

## 2019-09-24 NOTE — Progress Notes (Addendum)
2038: Arterial now has dampened inappropriate waveform.  Will have RT assess but for the time being will only be able to use the BP cuff for BP parameters.    2130: RT was able to get Gerald Rosario to have appropriate waveform.  Very positional.  Will go by Gerald Rosario reading for BP parameters as long as it continues to work.

## 2019-09-24 NOTE — Procedures (Signed)
Cannot do echo at this time since patient is leaving room for MRI

## 2019-09-24 NOTE — Progress Notes (Signed)
Pt has not voided since admission, bladder scanner showed 652mL. Dr. Lorraine Lax said it was ok to I & O him.

## 2019-09-24 NOTE — Anesthesia Procedure Notes (Signed)
Arterial Line Insertion Start/End6/23/2021 11:35 AM, 09/24/2019 11:40 AM Performed by: Shirlyn Goltz, CRNA, CRNA  Patient location: OOR procedure area. Emergency situation Left, radial was placed Catheter size: 20 G Hand hygiene performed , maximum sterile barriers used  and Seldinger technique used Allen's test indicative of satisfactory collateral circulation Attempts: 1 Procedure performed without using ultrasound guided technique. Following insertion, Biopatch and dressing applied. Post procedure assessment: normal

## 2019-09-24 NOTE — Progress Notes (Signed)
PT Cancellation Note  Patient Details Name: Gerald Rosario MRN: [id number] DOB: 11/20/1945   Cancelled Treatment:    Reason Eval/Treat Not Completed: Active bedrest order. Pt receiving tPA and on bedrest s/p arteriogram.   Zenaida Niece 09/24/2019, 3:57 PM

## 2019-09-25 ENCOUNTER — Inpatient Hospital Stay (HOSPITAL_COMMUNITY): Payer: Medicare HMO

## 2019-09-25 ENCOUNTER — Encounter (HOSPITAL_COMMUNITY): Payer: Self-pay | Admitting: Radiology

## 2019-09-25 DIAGNOSIS — I361 Nonrheumatic tricuspid (valve) insufficiency: Secondary | ICD-10-CM

## 2019-09-25 LAB — HEMOGLOBIN A1C
Hgb A1c MFr Bld: 6.2 % — ABNORMAL HIGH (ref 4.8–5.6)
Mean Plasma Glucose: 131.24 mg/dL

## 2019-09-25 LAB — CBC WITH DIFFERENTIAL/PLATELET
Abs Immature Granulocytes: 0.04 10*3/uL (ref 0.00–0.07)
Basophils Absolute: 0 10*3/uL (ref 0.0–0.1)
Basophils Relative: 0 %
Eosinophils Absolute: 0.4 10*3/uL (ref 0.0–0.5)
Eosinophils Relative: 4 %
HCT: 33.8 % — ABNORMAL LOW (ref 39.0–52.0)
Hemoglobin: 10.8 g/dL — ABNORMAL LOW (ref 13.0–17.0)
Immature Granulocytes: 0 %
Lymphocytes Relative: 11 %
Lymphs Abs: 1.2 10*3/uL (ref 0.7–4.0)
MCH: 29.7 pg (ref 26.0–34.0)
MCHC: 32 g/dL (ref 30.0–36.0)
MCV: 92.9 fL (ref 80.0–100.0)
Monocytes Absolute: 0.8 10*3/uL (ref 0.1–1.0)
Monocytes Relative: 7 %
Neutro Abs: 8.2 10*3/uL — ABNORMAL HIGH (ref 1.7–7.7)
Neutrophils Relative %: 78 %
Platelets: 387 10*3/uL (ref 150–400)
RBC: 3.64 MIL/uL — ABNORMAL LOW (ref 4.22–5.81)
RDW: 13.5 % (ref 11.5–15.5)
WBC: 10.6 10*3/uL — ABNORMAL HIGH (ref 4.0–10.5)
nRBC: 0 % (ref 0.0–0.2)

## 2019-09-25 LAB — LIPID PANEL
Cholesterol: 118 mg/dL (ref 0–200)
HDL: 38 mg/dL — ABNORMAL LOW (ref >40)
LDL Cholesterol: 38 mg/dL (ref 0–99)
Total CHOL/HDL Ratio: 3.1 RATIO
Triglycerides: 209 mg/dL — ABNORMAL HIGH (ref <150)
VLDL: 42 mg/dL — ABNORMAL HIGH (ref 0–40)

## 2019-09-25 LAB — BASIC METABOLIC PANEL
Anion gap: 8 (ref 5–15)
BUN: 11 mg/dL (ref 8–23)
CO2: 18 mmol/L — ABNORMAL LOW (ref 22–32)
Calcium: 8.6 mg/dL — ABNORMAL LOW (ref 8.9–10.3)
Chloride: 112 mmol/L — ABNORMAL HIGH (ref 98–111)
Creatinine, Ser: 1.03 mg/dL (ref 0.61–1.24)
GFR calc Af Amer: >60 mL/min (ref >60)
GFR calc non Af Amer: >60 mL/min (ref >60)
Glucose, Bld: 98 mg/dL (ref 70–99)
Potassium: 4.4 mmol/L (ref 3.5–5.1)
Sodium: 138 mmol/L (ref 135–145)

## 2019-09-25 LAB — ECHOCARDIOGRAM COMPLETE
Height: 68 in
Weight: 2313.95 oz

## 2019-09-25 LAB — GLUCOSE, CAPILLARY
Glucose-Capillary: 97 mg/dL (ref 70–99)
Glucose-Capillary: 118 mg/dL — ABNORMAL HIGH (ref 70–99)
Glucose-Capillary: 153 mg/dL — ABNORMAL HIGH (ref 70–99)
Glucose-Capillary: 83 mg/dL (ref 70–99)
Glucose-Capillary: 120 mg/dL — ABNORMAL HIGH (ref 70–99)

## 2019-09-25 MED ORDER — FUROSEMIDE 20 MG PO TABS
20.0000 mg | ORAL_TABLET | Freq: Every morning | ORAL | Status: DC
  Administered 2019-09-26 – 2019-10-07 (×11): 20 mg via ORAL
  Filled 2019-09-25 (×11): qty 1

## 2019-09-25 MED ORDER — ATORVASTATIN CALCIUM 40 MG PO TABS
40.0000 mg | ORAL_TABLET | Freq: Every day | ORAL | Status: DC
  Administered 2019-09-25 – 2019-10-06 (×11): 40 mg via ORAL
  Filled 2019-09-25 (×2): qty 1
  Filled 2019-09-25: qty 4
  Filled 2019-09-25 (×9): qty 1

## 2019-09-25 MED ORDER — RESOURCE THICKENUP CLEAR PO POWD
ORAL | Status: DC | PRN
  Filled 2019-09-25: qty 125

## 2019-09-25 MED ORDER — POLYVINYL ALCOHOL 1.4 % OP SOLN
1.0000 [drp] | Freq: Four times a day (QID) | OPHTHALMIC | Status: DC
  Administered 2019-09-25 – 2019-10-07 (×42): 1 [drp] via OPHTHALMIC
  Filled 2019-09-25: qty 15

## 2019-09-25 MED ORDER — CARBOXYMETHYLCELLULOSE SOD PF 0.5 % OP SOLN: 1.0000 [drp] | Freq: Four times a day (QID) | OPHTHALMIC | Status: DC

## 2019-09-25 MED ORDER — MOMETASONE FURO-FORMOTEROL FUM 100-5 MCG/ACT IN AERO
2.0000 | INHALATION_SPRAY | Freq: Two times a day (BID) | RESPIRATORY_TRACT | Status: DC
  Administered 2019-09-26 – 2019-10-07 (×16): 2 via RESPIRATORY_TRACT
  Filled 2019-09-25 (×2): qty 8.8

## 2019-09-25 MED ORDER — ASPIRIN EC 81 MG PO TBEC
81.0000 mg | DELAYED_RELEASE_TABLET | Freq: Every morning | ORAL | Status: DC
  Administered 2019-09-25 – 2019-09-27 (×3): 81 mg via ORAL
  Filled 2019-09-25 (×3): qty 1

## 2019-09-25 MED ORDER — TAMSULOSIN HCL 0.4 MG PO CAPS
0.4000 mg | ORAL_CAPSULE | Freq: Every day | ORAL | Status: DC
  Administered 2019-09-25 – 2019-10-07 (×13): 0.4 mg via ORAL
  Filled 2019-09-25 (×14): qty 1

## 2019-09-25 MED ORDER — FINASTERIDE 5 MG PO TABS
5.0000 mg | ORAL_TABLET | Freq: Every day | ORAL | Status: DC
  Administered 2019-09-25 – 2019-10-07 (×13): 5 mg via ORAL
  Filled 2019-09-25 (×14): qty 1

## 2019-09-25 MED ORDER — INSULIN ASPART 100 UNIT/ML ~~LOC~~ SOLN
2.0000 [IU] | Freq: Three times a day (TID) | SUBCUTANEOUS | Status: DC
  Administered 2019-09-26 (×2): 2 [IU] via SUBCUTANEOUS
  Administered 2019-09-26 – 2019-09-27 (×2): 4 [IU] via SUBCUTANEOUS

## 2019-09-25 MED ORDER — GLIPIZIDE 5 MG PO TABS
10.0000 mg | ORAL_TABLET | Freq: Two times a day (BID) | ORAL | Status: DC
  Administered 2019-09-25 – 2019-09-27 (×4): 10 mg via ORAL
  Filled 2019-09-25: qty 2
  Filled 2019-09-25 (×2): qty 1
  Filled 2019-09-25 (×2): qty 2

## 2019-09-25 MED ORDER — ADULT MULTIVITAMIN W/MINERALS CH
1.0000 | ORAL_TABLET | Freq: Every day | ORAL | Status: DC
  Administered 2019-09-25 – 2019-10-07 (×12): 1 via ORAL
  Filled 2019-09-25 (×13): qty 1

## 2019-09-25 MED ORDER — OMEGA-3-ACID ETHYL ESTERS 1 G PO CAPS
1.0000 g | ORAL_CAPSULE | Freq: Every day | ORAL | Status: DC
  Administered 2019-09-26 – 2019-10-07 (×10): 1 g via ORAL
  Filled 2019-09-25 (×11): qty 1

## 2019-09-25 MED ORDER — FERROUS SULFATE 325 (65 FE) MG PO TABS
325.0000 mg | ORAL_TABLET | ORAL | Status: DC
  Administered 2019-09-26 – 2019-10-06 (×4): 325 mg via ORAL
  Filled 2019-09-25 (×6): qty 1

## 2019-09-25 MED ORDER — LABETALOL HCL 5 MG/ML IV SOLN: 20.0000 mg | INTRAVENOUS | Status: DC | PRN

## 2019-09-25 MED ORDER — METOPROLOL TARTRATE 50 MG PO TABS
50.0000 mg | ORAL_TABLET | Freq: Two times a day (BID) | ORAL | Status: DC
  Administered 2019-09-25 – 2019-10-07 (×23): 50 mg via ORAL
  Filled 2019-09-25 (×24): qty 1

## 2019-09-25 MED ORDER — PERFLUTREN LIPID MICROSPHERE
1.0000 mL | INTRAVENOUS | Status: DC | PRN
  Administered 2019-09-25: 3 mL via INTRAVENOUS
  Filled 2019-09-25: qty 10

## 2019-09-25 MED ORDER — CLOPIDOGREL BISULFATE 75 MG PO TABS
75.0000 mg | ORAL_TABLET | Freq: Every day | ORAL | Status: DC
  Administered 2019-09-25 – 2019-10-02 (×8): 75 mg via ORAL
  Filled 2019-09-25 (×8): qty 1

## 2019-09-25 NOTE — Anesthesia Postprocedure Evaluation (Signed)
Anesthesia Post Note  Patient: TRAYVION EMBLETON  Procedure(s) Performed: IR WITH ANESTHESIA (N/A )     Patient location during evaluation: PACU Anesthesia Type: General Level of consciousness: awake and alert Pain management: pain level controlled Vital Signs Assessment: post-procedure vital signs reviewed and stable Respiratory status: spontaneous breathing, nonlabored ventilation, respiratory function stable and patient connected to nasal cannula oxygen Cardiovascular status: blood pressure returned to baseline and stable Postop Assessment: no apparent nausea or vomiting Anesthetic complications: no   No complications documented.  Last Vitals:  Vitals:   09/25/19 1200 09/25/19 1300  BP: (!) 131/59 (!) 126/51  Pulse: 78 79  Resp: 13 15  Temp: 36.6 C   SpO2: 100% 100%    Last Pain:  Vitals:   09/25/19 1200  TempSrc: Axillary                 Bria Portales S

## 2019-09-25 NOTE — Progress Notes (Signed)
OT Cancellation Note  Patient Details Name: Gerald Rosario MRN: [id number] DOB: 26-Jul-1945   Cancelled Treatment:    Reason Eval/Treat Not Completed: Active bedrest order  [place], OT/L   Acute OT Clinical Specialist Acute Rehabilitation Services Pager [phone] Office [phone]  09/25/2019, 7:42 AM

## 2019-09-25 NOTE — Progress Notes (Signed)
Modified Barium Swallow Progress Note  Patient Details  Name: Gerald Rosario MRN: [id number] Date of Birth: February 05, 1946  Today's Date: 09/25/2019  Modified Barium Swallow completed.  Full report located under Chart Review in the Imaging Section.  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 His oral phase is grossly functional with the exception of needing additional time for mastication. He is impulsive, taking very large cup sips at a time despite hand-over-hand assist from SLP to try to reduce his volume. His swallow is only mildly delayed, but his pharynx becomes filled with liquids and thin liquids spill over into the airway before the swallow. He also has reduced anterior hyoid movement, base of tongue retraction, and airway closure (suspect involvement of baseline impaired vocal cord), so all liquids via cup are aspirated during the swallow. His aspiration is primarily silent, with occasional delayed coughs noted across testing but not consistently. His airway protection is improved with SLP providing regulation via single spoonfuls at a time. Recommend starting Dys 2 (chopped) diet and nectar thick liquids via spoon only.    Swallow Evaluation Recommendations       SLP Diet Recommendations: Dysphagia 2 (Fine chop) solids;Nectar thick liquid   Liquid Administration via: Spoon   Medication Administration: Crushed with puree   Supervision: Staff to assist with self feeding;Full supervision/cueing for compensatory strategies   Compensations: Slow rate;Small sips/bites   Postural Changes: Seated upright at 90 degrees   Oral Care Recommendations: Oral care BID   Other Recommendations: Order thickener from pharmacy;Prohibited food (jello, ice cream, thin soups);Remove water pitcher     Osie Bond., M.A. [place] Pager  [phone] Office [phone]  09/25/2019,3:54 PM

## 2019-09-25 NOTE — Progress Notes (Signed)
Echocardiogram 2D Echocardiogram has been performed.  Oneal Deputy Yarelie Hams 09/25/2019, 9:14 AM

## 2019-09-25 NOTE — Progress Notes (Signed)
Referring Physician(s):   Supervising Physician: Luanne Bras  Patient Status:  [facility] - In-pt  Chief Complaint: Code Stroke  Subjective: Alert, sitting up in bed.   Frustrated by inability to speak.  Only able to say "Remember" and is clearly trying to express himself.  Moving right side.  Weakness noted in right arm.   Allergies: Sulfa antibiotics  Medications: Prior to Admission medications   Medication Sig Start Date End Date Taking? Authorizing Provider  ascorbic acid (VITAMIN C) 500 MG tablet Take 500 mg by mouth daily.   Yes [provider]  atorvastatin (LIPITOR) 40 MG tablet Take 1 tablet (40 mg total) by mouth daily. Patient taking differently: Take 40 mg by mouth at bedtime.  09/20/17  Yes Burns, Claudina Lick, MD  budesonide-formoterol (SYMBICORT) 80-4.5 MCG/ACT inhaler Take 2 puffs first thing in am and then another 2 puffs about 12 hours later. Patient taking differently: Inhale 2 puffs into the lungs 2 (two) times daily.  05/24/15  Yes Tanda Rockers, MD  Carboxymethylcellulose Sod PF 0.5 % SOLN Place 1 drop into both eyes 4 (four) times daily.   Yes [provider]  ferrous sulfate 325 (65 FE) MG EC tablet Take 325 mg by mouth See admin instructions. Take 325 mg by mouth on Mon/Wed/Fri and WITH the prescribed vitamin C (on these 3 days)   Yes [provider]  finasteride (PROSCAR) 5 MG tablet Take 5 mg by mouth daily.    Yes [provider]  furosemide (LASIX) 20 MG tablet Take 20 mg by mouth in the morning.   Yes [provider]  glipiZIDE (GLUCOTROL) 10 MG tablet Take 10 mg by mouth 2 (two) times daily before a meal.    Yes [provider]  hydrocortisone 2.5 % cream Apply 1 application topically See admin instructions. Apply a thin film to affected areas 3 times a day until issue resolves   Yes [provider]  Ipratropium-Albuterol (COMBIVENT RESPIMAT) 20-100 MCG/ACT AERS respimat Inhale 1 puff into  the lungs 4 (four) times daily as needed for wheezing (or coughing).    Yes [provider]  levothyroxine (SYNTHROID) 75 MCG tablet Take 75 mcg by mouth daily before breakfast.   Yes [provider]  metoprolol tartrate (LOPRESSOR) 50 MG tablet Take 50 mg by mouth 2 (two) times daily.   Yes [provider]  pantoprazole (PROTONIX) 40 MG tablet Take 40 mg by mouth daily before breakfast.    Yes [provider]  prednisoLONE acetate (PRED FORTE) 1 % ophthalmic suspension 3 drops See admin instructions. Instill 3 drops into each nostril at bedtime "in a head-hanging position" 01/09/19  Yes [provider]  tamsulosin (FLOMAX) 0.4 MG CAPS capsule Take 1 capsule (0.4 mg total) by mouth daily. Patient taking differently: Take 0.4 mg by mouth 2 (two) times a day.  09/08/13  Yes Barrett, Erin R, PA-C  traMADol (ULTRAM) 50 MG tablet Take 50 mg by mouth 2 (two) times daily as needed (for pain).    Yes [provider]  traZODone (DESYREL) 150 MG tablet Take 75 mg by mouth at bedtime.   Yes [provider]  Accu-Chek FastClix Lancets MISC USE TO CHECK BLOOD SUGARS THREE TIMES DAILY 07/08/18   Binnie Rail, MD  aspirin 81 MG tablet Take 1 tablet (81 mg total) by mouth every morning. 01/12/13   Festus Aloe, MD  Coenzyme Q10 200 MG capsule Take 200 mg by mouth daily.  [provider]  fish oil-omega-3 fatty acids 1000 MG capsule Take 1 g by mouth 2 (two) times daily.     [provider]  Glucosamine-Chondroitin (OSTEO BI-FLEX REGULAR STRENGTH PO) Take 1 capsule by mouth 2 (two) times daily.     [provider]  glucose blood (ACCU-CHEK AVIVA PLUS) test strip 1 each by Other route 2 (two) times daily. Dx code: E11.9 01/03/18   Binnie Rail, MD  Multiple Vitamin (MULTIVITAMIN) tablet Take 1 tablet by mouth daily.      [provider]  Multiple Vitamins-Minerals (OCUVITE PRESERVISION PO) Take 1 capsule by mouth  daily.     [provider]  Omega-3 Fatty Acids (FISH OIL) 1000 MG CAPS Take 1,000 mg by mouth daily.     [provider]  oxymetazoline (AFRIN) 0.05 % nasal spray Place 1 spray into both nostrils 2 (two) times daily.    [provider]  Red Yeast Rice Extract (RED YEAST RICE PO) Take 2 tablets by mouth at bedtime.     [provider]  UNABLE TO FIND Prevegen    [provider]     Vital Signs: BP (!) 131/59   Pulse 78   Temp 98.5 F (36.9 C) (Axillary)   Resp 13   Ht 5\' 8"  (1.727 m)   Wt 144 lb 10 oz (65.6 kg)   SpO2 100%   BMI 21.99 kg/m   Physical Exam  NAD, alert Neuro: Alert, expressive aphasia- only able to say the word "remember," follows some commands-- does better with commands he can mimic. EOMs intact. Decreased hand grip strength on the R, right upper extremity 4/5 compared to left.  Moving bilateral lower extremities.  Groin: soft intact.  No evidence of hematoma or pseudoaneurysm.  Pulses: DP identified by doppler.   Imaging: CT HEAD WO CONTRAST  Result Date: 09/25/2019 CLINICAL DATA:  Stroke, follow-up EXAM: CT HEAD WITHOUT CONTRAST TECHNIQUE: Contiguous axial images were obtained from the base of the skull through the vertex without intravenous contrast. COMPARISON:  09/24/2019 FINDINGS: Brain: There is hypoattenuation with loss of gray-white differentiation involving left occipital and posterior temporal lobes. Additional small area of cortical/subcortical involvement along the superior and middle frontal gyri. There is no acute intracranial hemorrhage. Small chronic infarct of the left superior frontal gyrus at the vertex. Ventricles are stable in size. Vascular: There is atherosclerotic calcification at the skull base. Skull: Calvarium is unremarkable. Sinuses/Orbits: Diffuse polypoid mucosal thickening. Bilateral lens replacements. Other: None. IMPRESSION: Evolving recent left MCA and MCA/PCA watershed territory infarcts.  No acute intracranial hemorrhage. Electronically Signed   By: Macy Mis M.D.   On: 09/25/2019 11:06   MR ANGIO HEAD WO CONTRAST  Result Date: 09/24/2019 CLINICAL DATA:  Stroke follow-up. Left ICA and MCA occlusion status post endovascular revascularization. EXAM: MRI HEAD WITHOUT CONTRAST MRA HEAD WITHOUT CONTRAST TECHNIQUE: Multiplanar, multiecho pulse sequences of the brain and surrounding structures were obtained without intravenous contrast. Angiographic images of the head were obtained using MRA technique without contrast. COMPARISON:  Head CT and CTA 09/24/2019 FINDINGS: MRI HEAD FINDINGS Brain: There is a small acute posterior left MCA/watershed infarct involving the posterior temporal, lateral occipital, and inferior parietal lobes. There are also very small acute cortical infarcts in the high posterior left frontal lobe, and there may be a punctate acute infarct in the posterior left insula. A small chronic infarct is noted in the posterior aspect of the left superior frontal gyrus. No intracranial hemorrhage, mass, midline  shift, or extra-axial fluid collection is identified. Mild cerebral atrophy is within normal limits for age. No significant chronic white matter disease is seen for age. Vascular: Major intracranial vascular flow voids are preserved. Skull and upper cervical spine: Unremarkable bone marrow signal para Sinuses/Orbits: Bilateral cataract extraction. Extensive mucosal thickening in the paranasal sinuses bilaterally. Clear mastoid air cells. Other: None. MRA HEAD FINDINGS The study is mildly to moderately motion degraded. The visualized distal vertebral arteries are widely patent to the basilar with the left being slightly dominant. The basilar artery is widely patent. There is a large left posterior communicating artery. Both PCAs are patent without evidence of a significant proximal stenosis. PCA branch vessel evaluation is limited by motion, particularly on the left. The internal  carotid arteries are patent from skull base to carotid termini with motion artifact limiting assessment for stenosis. An apparent moderate left cavernous ICA stenosis is likely exaggerated by motion as only a mild stenosis was present on today's cerebral angiogram following revascularization. There are apparent moderate proximal cavernous, severe mid to distal cavernous, and mild supraclinoid ICA stenoses on the right which may also be exaggerated by motion. ACAs and MCAs are patent without evidence of a proximal branch occlusion. There may be mild bilateral MCA stenoses. No aneurysm is identified. IMPRESSION: 1. Small acute left cerebral hemispheric infarcts most notable in the posterior left MCA/watershed territory. 2. Small chronic left frontal infarct. 3. Motion degraded head MRA without large vessel occlusion. Right worse than left ICA stenoses which are likely exaggerated by motion artifact as detailed above. Electronically Signed   By: Logan Bores M.D.   On: 09/24/2019 19:37   MR BRAIN WO CONTRAST  Result Date: 09/24/2019 CLINICAL DATA:  Stroke follow-up. Left ICA and MCA occlusion status post endovascular revascularization. EXAM: MRI HEAD WITHOUT CONTRAST MRA HEAD WITHOUT CONTRAST TECHNIQUE: Multiplanar, multiecho pulse sequences of the brain and surrounding structures were obtained without intravenous contrast. Angiographic images of the head were obtained using MRA technique without contrast. COMPARISON:  Head CT and CTA 09/24/2019 FINDINGS: MRI HEAD FINDINGS Brain: There is a small acute posterior left MCA/watershed infarct involving the posterior temporal, lateral occipital, and inferior parietal lobes. There are also very small acute cortical infarcts in the high posterior left frontal lobe, and there may be a punctate acute infarct in the posterior left insula. A small chronic infarct is noted in the posterior aspect of the left superior frontal gyrus. No intracranial hemorrhage, mass, midline  shift, or extra-axial fluid collection is identified. Mild cerebral atrophy is within normal limits for age. No significant chronic white matter disease is seen for age. Vascular: Major intracranial vascular flow voids are preserved. Skull and upper cervical spine: Unremarkable bone marrow signal para Sinuses/Orbits: Bilateral cataract extraction. Extensive mucosal thickening in the paranasal sinuses bilaterally. Clear mastoid air cells. Other: None. MRA HEAD FINDINGS The study is mildly to moderately motion degraded. The visualized distal vertebral arteries are widely patent to the basilar with the left being slightly dominant. The basilar artery is widely patent. There is a large left posterior communicating artery. Both PCAs are patent without evidence of a significant proximal stenosis. PCA branch vessel evaluation is limited by motion, particularly on the left. The internal carotid arteries are patent from skull base to carotid termini with motion artifact limiting assessment for stenosis. An apparent moderate left cavernous ICA stenosis is likely exaggerated by motion as only a mild stenosis was present on today's cerebral angiogram following revascularization. There are apparent moderate proximal  cavernous, severe mid to distal cavernous, and mild supraclinoid ICA stenoses on the right which may also be exaggerated by motion. ACAs and MCAs are patent without evidence of a proximal branch occlusion. There may be mild bilateral MCA stenoses. No aneurysm is identified. IMPRESSION: 1. Small acute left cerebral hemispheric infarcts most notable in the posterior left MCA/watershed territory. 2. Small chronic left frontal infarct. 3. Motion degraded head MRA without large vessel occlusion. Right worse than left ICA stenoses which are likely exaggerated by motion artifact as detailed above. Electronically Signed   By: Logan Bores M.D.   On: 09/24/2019 19:37   CT HEAD CODE STROKE WO CONTRAST  Result Date:  09/24/2019 CLINICAL DATA:  Code stroke. Acute neuro deficit. Right-sided weakness and aphasia. EXAM: CT HEAD WITHOUT CONTRAST TECHNIQUE: Contiguous axial images were obtained from the base of the skull through the vertex without intravenous contrast. COMPARISON:  CT head 08/18/2013 FINDINGS: Brain: Motion degraded study. Repeat imaging also degraded by motion. Generalized atrophy. No acute hemorrhage. Small chronic infarct over the left convexity. Evaluation for acute infarct not possible due to motion. Vascular: Hyperdense left MCA compatible with acute thrombus. Skull: Negative Sinuses/Orbits: Mucosal edema throughout the paranasal sinuses with air-fluid level left sphenoid sinus. Prior sinus surgery. Bilateral ocular surgery. Other: None ASPECTS ([place] Stroke Program Early CT Score) Not performed due to motion IMPRESSION: 1. Hyperdense left MCA compatible acute thrombus. 2. ASPECTS is not performed due to extensive motion. Negative for acute hemorrhage. 3. Results texted to Dr. Lorraine Lax Electronically Signed   By: Franchot Gallo M.D.   On: 09/24/2019 11:02   CT ANGIO HEAD CODE STROKE  Result Date: 09/24/2019 CLINICAL DATA:  Stroke.  Right-sided weakness and aphasia. EXAM: CT ANGIOGRAPHY HEAD AND NECK TECHNIQUE: Multidetector CT imaging of the head and neck was performed using the standard protocol during bolus administration of intravenous contrast. Multiplanar CT image reconstructions and MIPs were obtained to evaluate the vascular anatomy. Carotid stenosis measurements (when applicable) are obtained utilizing NASCET criteria, using the distal internal carotid diameter as the denominator. CONTRAST:  50mL OMNIPAQUE IOHEXOL 350 MG/ML SOLN COMPARISON:  CT head 09/24/2019 FINDINGS: CTA NECK FINDINGS Aortic arch: Atherosclerotic calcification aortic arch. Proximal great vessels widely patent. Right carotid system: Atherosclerotic calcification proximal left internal carotid artery. 40% diameter stenosis proximal  left internal carotid artery. Left carotid system: Left common carotid artery is widely patent. Left external carotid artery widely patent. 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 This remains occluded through the cervical segment and through the cavernous segment. There is reconstitution of the supraclinoid internal carotid artery on the left. Vertebral arteries: Both vertebral arteries are patent to the basilar with scattered atherosclerotic disease but no flow limiting stenosis. Skeleton: Cervical spondylosis.  No acute skeletal abnormality. Other neck: No soft tissue mass or adenopathy in the neck. Upper chest: Lung apices clear bilaterally. 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 Reconstitution of the supraclinoid internal carotid artery on the left. Left anterior cerebral artery widely patent. Abrupt occlusion of the left M1 segment with poor collateral circulation in the left MCA territory. Diffuse atherosclerotic calcification in the right cavernous carotid with moderate stenosis. Anterior and middle cerebral arteries patent on the right. Posterior circulation: Both vertebral arteries patent to the basilar. Mild to moderate stenosis distal vertebral artery due to calcific stenosis bilaterally. Left PICA patent. Right PICA not visualized. Diffuse atherosclerotic disease  throughout the basilar with mild to moderate stenosis. Superior cerebellar and posterior cerebral arteries patent bilaterally. Moderate stenosis left P2 segment. Venous sinuses: Negative Anatomic variants: None Review of the MIP images confirms the above findings IMPRESSION: 1. 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 Left internal carotid artery is occluded through the supraclinoid segment with reconstitution via collaterals. There is extensive atherosclerotic calcification in the  cavernous carotid bilaterally. There is abrupt occlusion left M1 segment compatible with acute thrombus. There is poor collateral circulation left MCA territory. 2. 40% diameter stenosis proximal right internal carotid artery. 3. Mild to moderate stenosis distal vertebral artery bilaterally. 4. These results were called by telephone at the time of interpretation on 09/24/2019 at 11:23 am to provider Aroor, who verbally acknowledged these results. Electronically Signed   By: Franchot Gallo M.D.   On: 09/24/2019 11:24   CT ANGIO NECK CODE STROKE  Result Date: 09/24/2019 CLINICAL DATA:  Stroke.  Right-sided weakness and aphasia. EXAM: CT ANGIOGRAPHY HEAD AND NECK TECHNIQUE: Multidetector CT imaging of the head and neck was performed using the standard protocol during bolus administration of intravenous contrast. Multiplanar CT image reconstructions and MIPs were obtained to evaluate the vascular anatomy. Carotid stenosis measurements (when applicable) are obtained utilizing NASCET criteria, using the distal internal carotid diameter as the denominator. CONTRAST:  22mL OMNIPAQUE IOHEXOL 350 MG/ML SOLN COMPARISON:  CT head 09/24/2019 FINDINGS: CTA NECK FINDINGS Aortic arch: Atherosclerotic calcification aortic arch. Proximal great vessels widely patent. Right carotid system: Atherosclerotic calcification proximal left internal carotid artery. 40% diameter stenosis proximal left internal carotid artery. Left carotid system: Left common carotid artery is widely patent. Left external carotid artery widely patent. 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 This remains occluded through the cervical segment and through the cavernous segment. There is reconstitution of the supraclinoid internal carotid artery on the left. Vertebral arteries: Both vertebral arteries are patent to the basilar with scattered atherosclerotic disease but no flow limiting stenosis. Skeleton: Cervical spondylosis.  No acute  skeletal abnormality. Other neck: No soft tissue mass or adenopathy in the neck. Upper chest: Lung apices clear bilaterally. 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 Reconstitution of the supraclinoid internal carotid artery on the left. Left anterior cerebral artery widely patent. Abrupt occlusion of the left M1 segment with poor collateral circulation in the left MCA territory. Diffuse atherosclerotic calcification in the right cavernous carotid with moderate stenosis. Anterior and middle cerebral arteries patent on the right. Posterior circulation: Both vertebral arteries patent to the basilar. Mild to moderate stenosis distal vertebral artery due to calcific stenosis bilaterally. Left PICA patent. Right PICA not visualized. Diffuse atherosclerotic disease throughout the basilar with mild to moderate stenosis. Superior cerebellar and posterior cerebral arteries patent bilaterally. Moderate stenosis left P2 segment. Venous sinuses: Negative Anatomic variants: None Review of the MIP images confirms the above findings IMPRESSION: 1. 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 Left internal carotid artery is occluded through the supraclinoid segment with reconstitution via collaterals. There is extensive atherosclerotic calcification in the cavernous carotid bilaterally. There is abrupt occlusion left M1 segment compatible with acute thrombus. There is poor collateral circulation left MCA territory. 2. 40% diameter stenosis proximal right internal carotid artery. 3. Mild to moderate stenosis distal vertebral artery bilaterally. 4. These results were called by telephone at the time of interpretation on 09/24/2019 at 11:23 am to provider Aroor, who verbally  acknowledged these results. Electronically Signed   By: Franchot Gallo M.D.   On: 09/24/2019 11:24    Labs:  CBC: Recent  Labs    03/21/19 0928 09/24/19 1046 09/24/19 1052 09/25/19 0444  WBC 8.0 10.4  --  10.6*  HGB 12.6* 11.9* 12.6* 10.8*  HCT 38.0* 38.5* 37.0* 33.8*  PLT 448.0* 405*  --  387    COAGS: Recent Labs    09/24/19 1046  INR 1.1  APTT 28    BMP: Recent Labs    03/21/19 0928 09/24/19 1046 09/24/19 1052 09/25/19 0444  NA 134* 139 139 138  K 5.1 4.6 4.6 4.4  CL 103 106 105 112*  CO2 27 22  --  18*  GLUCOSE 104* 108* 105* 98  BUN 18 15 18 11   CALCIUM 9.4 9.4  --  8.6*  CREATININE 1.28 1.29* 1.20 1.03  GFRNONAA  --  54*  --  >60  GFRAA  --  >60  --  >60    LIVER FUNCTION TESTS: Recent Labs    03/21/19 0928 09/24/19 1046  BILITOT 0.5 0.9  AST 21 31  ALT 15 17  ALKPHOS 61 56  PROT 7.1 6.8  ALBUMIN 4.0 3.7    Assessment and Plan: S/P revascularization of occluded Lt ICA intracranially and extracranially and LT MCA to the inferior division ,and Lt ACA prox with x1 pass with solitaireX 63mmx 40 mm and x1 pass with embotrap 85mm x 37 mm and penumbra aspiration achieving a TICI 3 revascularization. Improvement noted in ability to move right arm and leg since procedure yesterday afternoon.  Remains aphasic.  Only able to say "remember."  Appears frustrated.  Groin soft, intact.  Pulses identified by doppler.  Further recommendations per Neuro/primary team.  NIR available as needed.    Electronically Signed: Docia Barrier, PA 09/25/2019, 12:26 PM   I spent a total of 15 Minutes at the the patient's bedside AND on the patient's hospital floor or unit, greater than 50% of which was counseling/coordinating care for L MCA CVA.
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
## 2019-09-25 NOTE — Progress Notes (Signed)
Patient's daughter [name] called. She lives in [place] and made it clear that she is not in an active relationship with her dad and has not been for over three years. She is an ICU nurse and very knowledgeable of the situation. She is willing to receive updates and give consents for pt's needs but she is not local and will not be able to provide any care to the patient in his future. Pt does not have any other family members except a distant cousin. She gave me her husband's number (in the chart) as a backup phone number if we cannot reach her. She is grateful for our care and available as needed.

## 2019-09-25 NOTE — Evaluation (Signed)
Physical Therapy Evaluation Patient Details Name: Gerald Rosario MRN: [id number] DOB: [birth year] Today's Date: 09/25/2019   History of Present Illness  74 y.o. male with history of prostate cancer, CABG x3, PAF, ischemic cardiomyopathy, hypertension, hyperlipidemia, diabetes and CAD.  Patient was at his primary care physician's office today to further evaluate episodes of losing strength in all extremities.  While he was at the primary care office patient had sudden onset of left gaze deviation and right-sided weakness. 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 Pt went to IR on 6/23 for revacularization.  Clinical Impression  Pt presents to PT with deficits in functional mobility, gait, balance, endurance, strength, power, cognition, communication. Pt with significant expressive and receptive aphasia, only able to state the word "remember" repeatedly this session. Pt with flaccid RUE and RLE weakness resulting in knee buckling with attempts to initiate gait training. Pt demonstrates a preference to attend to the left, however is able to track and scan to R side with cues. Pt's PLOF is difficult to determine as no caregivers present and pt with significant aphasia, however note from admission in 2015 reports the pt was independent and retired. Pt will benefit from aggressive mobilization and acute PT services to improve mobility quality and to reduce falls risk. PT recommends CIR at this time as the pt demonstrates the potential to make significant functional improvement in mobility if the pt was indeed independent prior to admission, however the pt does appear to have limited to no caregiver support.    Follow Up Recommendations CIR (pt may need SNF, poor caregiver support)    Equipment Recommendations  Wheelchair (measurements PT);Wheelchair cushion (measurements PT);Hospital bed (if home today)    Recommendations for Other Services Rehab consult     Precautions / Restrictions  Precautions Precautions: Fall Restrictions Weight Bearing Restrictions: No      Mobility  Bed Mobility Overal bed mobility: Needs Assistance Bed Mobility: Supine to Sit;Sit to Supine     Supine to sit: Mod assist Sit to supine: Max assist      Transfers Overall transfer level: Needs assistance Equipment used: 1 person hand held assist Transfers: Sit to/from Stand Sit to Stand: Min assist            Ambulation/Gait Ambulation/Gait assistance: Mod assist Gait Distance (Feet): 1 Feet Assistive device: 1 person hand held assist Gait Pattern/deviations: Step-to pattern Gait velocity: reduced Gait velocity interpretation: <1.31 ft/sec, indicative of household ambulator General Gait Details: pt with R knee buckle with attempted WB through this extremity to step with LLE  Stairs            Wheelchair Mobility    Modified Rankin (Stroke Patients Only) Modified Rankin (Stroke Patients Only) Pre-Morbid Rankin Score: No symptoms Modified Rankin: Moderately severe disability     Balance Overall balance assessment: Needs assistance Sitting-balance support: No upper extremity supported;Feet supported Sitting balance-Leahy Scale: Poor Sitting balance - Comments: min-modA for dynamic sitting balance when donning socks   Standing balance support: Single extremity supported Standing balance-Leahy Scale: Poor Standing balance comment: minA for static standing balance at this time, modA for dynamic 2/2 R knee buckle                             Pertinent Vitals/Pain Pain Assessment: Faces Faces Pain Scale: Hurts a little bit Pain Location: generalized Pain Descriptors / Indicators: Grimacing Pain Intervention(s): Monitored during session    Home Living  Family/patient expects to be discharged to:: Unsure                 Additional Comments: pt unable to report history 2/2 expressive aphasia. Per RN pt has estranged dtr in [place] who is unable to  provide support at time of discharge. Seems pt has no other caregivers available    Prior Function           Comments: unsure, per prior chart review in 2015 pt was independent     Hand Dominance        Extremity/Trunk Assessment   Upper Extremity Assessment Upper Extremity Assessment: RUE deficits/detail RUE Deficits / Details: flaccid RUE, inattention to RUE    Lower Extremity Assessment Lower Extremity Assessment: RLE deficits/detail RLE Deficits / Details: generalized weakness, unable to formally assess 2/2 communication and cognitive deficits, however at least 3/5 based on observed mobility    Cervical / Trunk Assessment Cervical / Trunk Assessment: Normal  Communication   Communication: Receptive difficulties;Expressive difficulties  Cognition Arousal/Alertness: Awake/alert Behavior During Therapy: WFL for tasks assessed/performed Overall Cognitive Status: Difficult to assess                                        General Comments General comments (skin integrity, edema, etc.): VSS on RA    Exercises     Assessment/Plan    PT Assessment Patient needs continued PT services  PT Problem List Decreased strength;Decreased activity tolerance;Decreased balance;Decreased mobility;Decreased coordination;Decreased cognition;Decreased knowledge of use of DME;Decreased safety awareness;Decreased knowledge of precautions       PT Treatment Interventions DME instruction;Gait training;Stair training;Functional mobility training;Therapeutic activities;Therapeutic exercise;Balance training;Neuromuscular re-education;Cognitive remediation;Patient/family education    PT Goals (Current goals can be found in the Care Plan section)  Acute Rehab PT Goals Patient Stated Goal: pt unable to state, PT goal to improve mobility and reduce falls risk PT Goal Formulation: Patient unable to participate in goal setting Time For Goal Achievement: 10/09/19 Potential to  Achieve Goals: Fair    Frequency Min 4X/week   Barriers to discharge        Co-evaluation               AM-PAC PT "6 Clicks" Mobility  Outcome Measure Help needed turning from your back to your side while in a flat bed without using bedrails?: A Lot Help needed moving from lying on your back to sitting on the side of a flat bed without using bedrails?: A Lot Help needed moving to and from a bed to a chair (including a wheelchair)?: A Lot Help needed standing up from a chair using your arms (e.g., wheelchair or bedside chair)?: A Little Help needed to walk in hospital room?: Total Help needed climbing 3-5 steps with a railing? : Total 6 Click Score: 11    End of Session   Activity Tolerance: Patient tolerated treatment well Patient left: in bed;with call bell/phone within reach;with bed alarm set Nurse Communication: Mobility status PT Visit Diagnosis: Other abnormalities of gait and mobility (R26.89);Muscle weakness (generalized) (M62.81);Other symptoms and signs involving the nervous system (R29.898);Hemiplegia and hemiparesis Hemiplegia - Right/Left: Right Hemiplegia - dominant/non-dominant: Dominant Hemiplegia - caused by: Cerebral infarction    Time: 4944-9675 PT Time Calculation (min) (ACUTE ONLY): 15 min   Charges:   PT Evaluation $PT Eval Moderate Complexity: 1 Mod          Zenaida Niece,  PT, DPT Acute Rehabilitation Pager: [phone]   Zenaida Niece 09/25/2019, 2:00 PM
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
## 2019-09-25 NOTE — Progress Notes (Signed)
STROKE TEAM PROGRESS NOTE   INTERVAL HISTORY I have personally reviewed history of presenting illness, electronic medical records and imaging films in PACS.  He presented with recurrent episodes of aphasia right-sided weakness and left gaze deviation and CT scan of the head showed hyperdense left MCA sign and CT angio Phillip Heal confirmed proximal left ICA left MCA occlusion underwent treatment with IV TPA followed by successful mechanical thrombectomy.  He was extubated.  He is breathing well but remains globally aphasic and right-sided homonymous hemianopsia.  Right-sided weakness has improved.  Blood pressure tightly controlled.  Vital signs are stable.  Vitals:   09/25/19 0500 09/25/19 0600 09/25/19 0700 09/25/19 0800  BP: (!) 129/51 (!) 119/41 (!) 121/49 (!) 125/46  Pulse: 82 74 80 79  Resp: 18 18 14 20   Temp:    98.5 F (36.9 C)  TempSrc:    Axillary  SpO2: 99% 99% 99% 99%  Weight:      Height:        CBC:  Recent Labs  Lab 09/24/19 1046 09/24/19 1046 09/24/19 1052 09/25/19 0444  WBC 10.4  --   --  10.6*  NEUTROABS 8.1*  --   --  8.2*  HGB 11.9*   < > 12.6* 10.8*  HCT 38.5*   < > 37.0* 33.8*  MCV 93.2  --   --  92.9  PLT 405*  --   --  387   < > = values in this interval not displayed.    Basic Metabolic Panel:  Recent Labs  Lab 09/24/19 1046 09/24/19 1046 09/24/19 1052 09/25/19 0444  NA 139   < > 139 138  K 4.6   < > 4.6 4.4  CL 106   < > 105 112*  CO2 22  --   --  18*  GLUCOSE 108*   < > 105* 98  BUN 15   < > 18 11  CREATININE 1.29*   < > 1.20 1.03  CALCIUM 9.4  --   --  8.6*   < > = values in this interval not displayed.   Lipid Panel:     Component Value Date/Time   CHOL 118 09/25/2019 0444   TRIG 209 (H) 09/25/2019 0444   HDL 38 (L) 09/25/2019 0444   CHOLHDL 3.1 09/25/2019 0444   VLDL 42 (H) 09/25/2019 0444   LDLCALC 38 09/25/2019 0444   HgbA1c:  Lab Results  Component Value Date   HGBA1C 6.2 (H) 09/25/2019   Urine Drug Screen:     Component  Value Date/Time   LABOPIA NONE DETECTED 09/24/2019 1715   COCAINSCRNUR NONE DETECTED 09/24/2019 1715   LABBENZ POSITIVE (A) 09/24/2019 1715   AMPHETMU NONE DETECTED 09/24/2019 1715   THCU NONE DETECTED 09/24/2019 1715   LABBARB NONE DETECTED 09/24/2019 1715    Alcohol Level     Component Value Date/Time   ETH <10 09/24/2019 1046    IMAGING past 24 hours MR ANGIO HEAD WO CONTRAST  Result Date: 09/24/2019 CLINICAL DATA:  Stroke follow-up. Left ICA and MCA occlusion status post endovascular revascularization. EXAM: MRI HEAD WITHOUT CONTRAST MRA HEAD WITHOUT CONTRAST TECHNIQUE: Multiplanar, multiecho pulse sequences of the brain and surrounding structures were obtained without intravenous contrast. Angiographic images of the head were obtained using MRA technique without contrast. COMPARISON:  Head CT and CTA 09/24/2019 FINDINGS: MRI HEAD FINDINGS Brain: There is a small acute posterior left MCA/watershed infarct involving the posterior temporal, lateral occipital, and inferior parietal lobes. There are also very  small acute cortical infarcts in the high posterior left frontal lobe, and there may be a punctate acute infarct in the posterior left insula. A small chronic infarct is noted in the posterior aspect of the left superior frontal gyrus. No intracranial hemorrhage, mass, midline shift, or extra-axial fluid collection is identified. Mild cerebral atrophy is within normal limits for age. No significant chronic white matter disease is seen for age. Vascular: Major intracranial vascular flow voids are preserved. Skull and upper cervical spine: Unremarkable bone marrow signal para Sinuses/Orbits: Bilateral cataract extraction. Extensive mucosal thickening in the paranasal sinuses bilaterally. Clear mastoid air cells. Other: None. MRA HEAD FINDINGS The study is mildly to moderately motion degraded. The visualized distal vertebral arteries are widely patent to the basilar with the left being slightly  dominant. The basilar artery is widely patent. There is a large left posterior communicating artery. Both PCAs are patent without evidence of a significant proximal stenosis. PCA branch vessel evaluation is limited by motion, particularly on the left. The internal carotid arteries are patent from skull base to carotid termini with motion artifact limiting assessment for stenosis. An apparent moderate left cavernous ICA stenosis is likely exaggerated by motion as only a mild stenosis was present on today's cerebral angiogram following revascularization. There are apparent moderate proximal cavernous, severe mid to distal cavernous, and mild supraclinoid ICA stenoses on the right which may also be exaggerated by motion. ACAs and MCAs are patent without evidence of a proximal branch occlusion. There may be mild bilateral MCA stenoses. No aneurysm is identified. IMPRESSION: 1. Small acute left cerebral hemispheric infarcts most notable in the posterior left MCA/watershed territory. 2. Small chronic left frontal infarct. 3. Motion degraded head MRA without large vessel occlusion. Right worse than left ICA stenoses which are likely exaggerated by motion artifact as detailed above. Electronically Signed   By: Logan Bores M.D.   On: 09/24/2019 19:37   MR BRAIN WO CONTRAST  Result Date: 09/24/2019 CLINICAL DATA:  Stroke follow-up. Left ICA and MCA occlusion status post endovascular revascularization. EXAM: MRI HEAD WITHOUT CONTRAST MRA HEAD WITHOUT CONTRAST TECHNIQUE: Multiplanar, multiecho pulse sequences of the brain and surrounding structures were obtained without intravenous contrast. Angiographic images of the head were obtained using MRA technique without contrast. COMPARISON:  Head CT and CTA 09/24/2019 FINDINGS: MRI HEAD FINDINGS Brain: There is a small acute posterior left MCA/watershed infarct involving the posterior temporal, lateral occipital, and inferior parietal lobes. There are also very small acute  cortical infarcts in the high posterior left frontal lobe, and there may be a punctate acute infarct in the posterior left insula. A small chronic infarct is noted in the posterior aspect of the left superior frontal gyrus. No intracranial hemorrhage, mass, midline shift, or extra-axial fluid collection is identified. Mild cerebral atrophy is within normal limits for age. No significant chronic white matter disease is seen for age. Vascular: Major intracranial vascular flow voids are preserved. Skull and upper cervical spine: Unremarkable bone marrow signal para Sinuses/Orbits: Bilateral cataract extraction. Extensive mucosal thickening in the paranasal sinuses bilaterally. Clear mastoid air cells. Other: None. MRA HEAD FINDINGS The study is mildly to moderately motion degraded. The visualized distal vertebral arteries are widely patent to the basilar with the left being slightly dominant. The basilar artery is widely patent. There is a large left posterior communicating artery. Both PCAs are patent without evidence of a significant proximal stenosis. PCA branch vessel evaluation is limited by motion, particularly on the left. The internal carotid arteries  are patent from skull base to carotid termini with motion artifact limiting assessment for stenosis. An apparent moderate left cavernous ICA stenosis is likely exaggerated by motion as only a mild stenosis was present on today's cerebral angiogram following revascularization. There are apparent moderate proximal cavernous, severe mid to distal cavernous, and mild supraclinoid ICA stenoses on the right which may also be exaggerated by motion. ACAs and MCAs are patent without evidence of a proximal branch occlusion. There may be mild bilateral MCA stenoses. No aneurysm is identified. IMPRESSION: 1. Small acute left cerebral hemispheric infarcts most notable in the posterior left MCA/watershed territory. 2. Small chronic left frontal infarct. 3. Motion degraded head  MRA without large vessel occlusion. Right worse than left ICA stenoses which are likely exaggerated by motion artifact as detailed above. Electronically Signed   By: Logan Bores M.D.   On: 09/24/2019 19:37   CT HEAD CODE STROKE WO CONTRAST  Result Date: 09/24/2019 CLINICAL DATA:  Code stroke. Acute neuro deficit. Right-sided weakness and aphasia. EXAM: CT HEAD WITHOUT CONTRAST TECHNIQUE: Contiguous axial images were obtained from the base of the skull through the vertex without intravenous contrast. COMPARISON:  CT head 08/18/2013 FINDINGS: Brain: Motion degraded study. Repeat imaging also degraded by motion. Generalized atrophy. No acute hemorrhage. Small chronic infarct over the left convexity. Evaluation for acute infarct not possible due to motion. Vascular: Hyperdense left MCA compatible with acute thrombus. Skull: Negative Sinuses/Orbits: Mucosal edema throughout the paranasal sinuses with air-fluid level left sphenoid sinus. Prior sinus surgery. Bilateral ocular surgery. Other: None ASPECTS ([place] Stroke Program Early CT Score) Not performed due to motion IMPRESSION: 1. Hyperdense left MCA compatible acute thrombus. 2. ASPECTS is not performed due to extensive motion. Negative for acute hemorrhage. 3. Results texted to Dr. Lorraine Lax Electronically Signed   By: Franchot Gallo M.D.   On: 09/24/2019 11:02   CT ANGIO HEAD CODE STROKE  Result Date: 09/24/2019 CLINICAL DATA:  Stroke.  Right-sided weakness and aphasia. EXAM: CT ANGIOGRAPHY HEAD AND NECK TECHNIQUE: Multidetector CT imaging of the head and neck was performed using the standard protocol during bolus administration of intravenous contrast. Multiplanar CT image reconstructions and MIPs were obtained to evaluate the vascular anatomy. Carotid stenosis measurements (when applicable) are obtained utilizing NASCET criteria, using the distal internal carotid diameter as the denominator. CONTRAST:  61mL OMNIPAQUE IOHEXOL 350 MG/ML SOLN COMPARISON:  CT  head 09/24/2019 FINDINGS: CTA NECK FINDINGS Aortic arch: Atherosclerotic calcification aortic arch. Proximal great vessels widely patent. Right carotid system: Atherosclerotic calcification proximal left internal carotid artery. 40% diameter stenosis proximal left internal carotid artery. Left carotid system: Left common carotid artery is widely patent. Left external carotid artery widely patent. 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 This remains occluded through the cervical segment and through the cavernous segment. There is reconstitution of the supraclinoid internal carotid artery on the left. Vertebral arteries: Both vertebral arteries are patent to the basilar with scattered atherosclerotic disease but no flow limiting stenosis. Skeleton: Cervical spondylosis.  No acute skeletal abnormality. Other neck: No soft tissue mass or adenopathy in the neck. Upper chest: Lung apices clear bilaterally. 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 Reconstitution of the supraclinoid internal carotid artery on the left. Left anterior cerebral artery widely patent. Abrupt occlusion of the left M1 segment with poor collateral circulation in the left MCA territory. Diffuse atherosclerotic calcification in the right  cavernous carotid with moderate stenosis. Anterior and middle cerebral arteries patent on the right. Posterior circulation: Both vertebral arteries patent to the basilar. Mild to moderate stenosis distal vertebral artery due to calcific stenosis bilaterally. Left PICA patent. Right PICA not visualized. Diffuse atherosclerotic disease throughout the basilar with mild to moderate stenosis. Superior cerebellar and posterior cerebral arteries patent bilaterally. Moderate stenosis left P2 segment. Venous sinuses: Negative Anatomic variants: None Review of the MIP images  confirms the above findings IMPRESSION: 1. 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 Left internal carotid artery is occluded through the supraclinoid segment with reconstitution via collaterals. There is extensive atherosclerotic calcification in the cavernous carotid bilaterally. There is abrupt occlusion left M1 segment compatible with acute thrombus. There is poor collateral circulation left MCA territory. 2. 40% diameter stenosis proximal right internal carotid artery. 3. Mild to moderate stenosis distal vertebral artery bilaterally. 4. These results were called by telephone at the time of interpretation on 09/24/2019 at 11:23 am to provider Aroor, who verbally acknowledged these results. Electronically Signed   By: Franchot Gallo M.D.   On: 09/24/2019 11:24   CT ANGIO NECK CODE STROKE  Result Date: 09/24/2019 CLINICAL DATA:  Stroke.  Right-sided weakness and aphasia. EXAM: CT ANGIOGRAPHY HEAD AND NECK TECHNIQUE: Multidetector CT imaging of the head and neck was performed using the standard protocol during bolus administration of intravenous contrast. Multiplanar CT image reconstructions and MIPs were obtained to evaluate the vascular anatomy. Carotid stenosis measurements (when applicable) are obtained utilizing NASCET criteria, using the distal internal carotid diameter as the denominator. CONTRAST:  30mL OMNIPAQUE IOHEXOL 350 MG/ML SOLN COMPARISON:  CT head 09/24/2019 FINDINGS: CTA NECK FINDINGS Aortic arch: Atherosclerotic calcification aortic arch. Proximal great vessels widely patent. Right carotid system: Atherosclerotic calcification proximal left internal carotid artery. 40% diameter stenosis proximal left internal carotid artery. Left carotid system: Left common carotid artery is widely patent. Left external carotid artery widely patent. 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 This remains occluded through the cervical segment and through the cavernous segment.  There is reconstitution of the supraclinoid internal carotid artery on the left. Vertebral arteries: Both vertebral arteries are patent to the basilar with scattered atherosclerotic disease but no flow limiting stenosis. Skeleton: Cervical spondylosis.  No acute skeletal abnormality. Other neck: No soft tissue mass or adenopathy in the neck. Upper chest: Lung apices clear bilaterally. 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 Reconstitution of the supraclinoid internal carotid artery on the left. Left anterior cerebral artery widely patent. Abrupt occlusion of the left M1 segment with poor collateral circulation in the left MCA territory. Diffuse atherosclerotic calcification in the right cavernous carotid with moderate stenosis. Anterior and middle cerebral arteries patent on the right. Posterior circulation: Both vertebral arteries patent to the basilar. Mild to moderate stenosis distal vertebral artery due to calcific stenosis bilaterally. Left PICA patent. Right PICA not visualized. Diffuse atherosclerotic disease throughout the basilar with mild to moderate stenosis. Superior cerebellar and posterior cerebral arteries patent bilaterally. Moderate stenosis left P2 segment. Venous sinuses: Negative Anatomic variants: None Review of the MIP images confirms the above findings IMPRESSION: 1. 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 Left internal carotid artery is occluded through the supraclinoid segment with reconstitution via collaterals. There is extensive atherosclerotic calcification in the cavernous carotid bilaterally. There is abrupt occlusion left M1 segment compatible with acute thrombus. There is poor  collateral circulation left MCA territory. 2. 40% diameter stenosis proximal right internal carotid artery. 3. Mild to moderate stenosis distal vertebral artery  bilaterally. 4. These results were called by telephone at the time of interpretation on 09/24/2019 at 11:23 am to provider Aroor, who verbally acknowledged these results. Electronically Signed   By: Franchot Gallo M.D.   On: 09/24/2019 11:24    PHYSICAL EXAM Pleasant elderly Caucasian male not in distress. . Afebrile. Head is nontraumatic. Neck is supple without bruit.    Cardiac exam no murmur or gallop. Lungs are clear to auscultation. Distal pulses are well felt. Neurological Exam :  Patient is awake alert.  He is globally aphasic.  He does not follow any commands even midline simple commands.  He has nonfluent speech and speaks occasional words and few short sentences which are difficult to comprehend.  It is not name repeat and comprehend.  Extraocular movements show left gaze preference and unable to look to the right.  Blinks to threat on the left but not on the right.  Right lower facial weakness.  Tongue midline.  Motor system exam is able to move both upper and lower extremities against gravity purposefully.  Mild weakness of right grip and intrinsic hand muscles.  Sensation appears preserved bilaterally.  Deep tendon reflexes symmetric.  Plantars are downgoing.  Gait not tested. ASSESSMENT/PLAN Gerald Rosario is a 74 y.o. male with history of  prostate cancer, CABG x3, PAF, ischemic cardiomyopathy, hypertension, hyperlipidemia, diabetes and CAD presenting from PCP with transient L gaze deviation and R sided weakness. Upon arrival to ED pt again with sudden onset L eye deviation, aphasia, R sided weakness and R facial droop. He received IV tPA 09/24/2019 at 1102, CTA showed L ICA and L M1 occlusion and pt was taken for IR.  Stroke:   L MCA infarct embolic secondary to unknown source - Possible AF vs LV clot S/P revascularization of occluded Lt ICA intracranially and extracranially and LT MCA to the inferior division ,and Lt ACA prox with x1 pass with solitaireX 55mmx 40 mm and x1 pass with  embotrap 24mm x 37 mm and penumbra aspiration achieving a TICI 3 revascularization.  Code Stroke CT head hyperdense L MCA  CTA head & neck L ICA occlusion. Extensive B cavernous ICA atherosclerosis. L M1 occlusion w/ poor collaterals. Proximal R ICA 40% stenosis. Mild to moderate distal B VA stenoses.   Cerebral angio / IR - intra and extracranial L ICA occlusion and L MCA, L ACA occlusions - TICI3 revascularization w/ solitaire & embotrap    Post IR CT neg for hemorrhage or  mass effect  MRI  Small L cerebral (posterior L MCA/watershed) infarct. Old L frontal lobe infarct   MRA  Motion artifact - R>L ICA stenoses  2D Echo EF 50-55%. No source of embolus. Sluggish LV flow but no thrombus seen. LA moderate dilated. 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 TEE recommended.   LDL 38  HgbA1c 6.2  SCDs for VTE prophylaxis  aspirin 81 mg daily prior to admission, now on aspirin 81 mg daily and clopidogrel 75 mg daily x 3 weeks then plavix alone    Therapy recommendations:  CIR  Disposition:  pending  Transfer to the floor  Arrythmia  Hx paroxsymal Atrial Fibrillation post CABG  Had loop for syncope - inserted 08/20/2013 removed 10/11/2016 at EOL - no arrhythmias noted per Taylor's note prior to explant   Hypertension  Home meds:  Lasix 20, metoprolol  50 bid  On cleviprex, wean as able  Prn labetolol    BP goal per IR x 24h following IR procedure and tPA administration, then goal < 160  Resume home meds   Long-term BP goal normotensive  Hyperlipidemia  Home meds:  lipitor 40, fish oil and red rice yeast  Resume home statin and fish oil hospital  LDL 38, goal < 70  Continue cholesterol meds at discharge  Diabetes type II Controlled  Home meds:  Glipizide 10 bid  HgbA1c 6.2, goal < 7.0  Dysphagia  Secondary to stroke  MBSS cleared for D2 nectar thick liquids  Speech on board   Other Stroke Risk Factors  Advanced age  Former Cigarette smoker,  advised to stop smoking  ETOH use, alcohol level <10, advised to drink no more than 2 drink(s) a day  Family hx - died died of MI at age 59  Coronary artery disease s./p CABG x 3  Chronic systolic congestive heart failure  Ischemic cardiomyopathy   Other Active Problems  Prostate cancer   Hospital day # 1 He presented with embolic left terminal ICA and MCA occlusion likely from cardioembolic etiology.  Continue strict blood pressure control and close neurological monitoring as per post TPA and intervention protocol.  MRI scan does confirm left MCA posterior division cortical infarct.  He will likely need anticoagulation given prior history of A. fib and abnormal echo but after 3 to 5 days.  Continue aspirin for now.  No family available for discussion.  Discussed with Dr. Estanislado Pandy.This patient is critically ill and at significant risk of neurological worsening, death and care requires constant monitoring of vital signs, hemodynamics,respiratory and cardiac monitoring, extensive review of multiple databases, frequent neurological assessment, discussion with family, other specialists and medical decision making of high complexity.I have made any additions or clarifications directly to the above note.This critical care time does not reflect procedure time, or teaching time or supervisory time of PA/NP/Med Resident etc but could involve care discussion time.  I spent 30 minutes of neurocritical care time  in the care of  this patient.   Agustin Swatek,MD     To contact Stroke Continuity provider, please refer to [email or web address] After hours, contact General Neurology

## 2019-09-26 DIAGNOSIS — I6602 Occlusion and stenosis of left middle cerebral artery: Secondary | ICD-10-CM

## 2019-09-26 LAB — GLUCOSE, CAPILLARY
Glucose-Capillary: 101 mg/dL — ABNORMAL HIGH (ref 70–99)
Glucose-Capillary: 111 mg/dL — ABNORMAL HIGH (ref 70–99)
Glucose-Capillary: 142 mg/dL — ABNORMAL HIGH (ref 70–99)
Glucose-Capillary: 168 mg/dL — ABNORMAL HIGH (ref 70–99)
Glucose-Capillary: 61 mg/dL — ABNORMAL LOW (ref 70–99)
Glucose-Capillary: 64 mg/dL — ABNORMAL LOW (ref 70–99)
Glucose-Capillary: 91 mg/dL (ref 70–99)

## 2019-09-26 MED ORDER — PANTOPRAZOLE SODIUM 40 MG PO PACK
40.0000 mg | PACK | Freq: Every day | ORAL | Status: DC
  Administered 2019-09-26 – 2019-10-06 (×10): 40 mg via ORAL
  Filled 2019-09-26 (×12): qty 20

## 2019-09-26 NOTE — Progress Notes (Signed)
Inpatient Diabetes Program Recommendations  AACE/ADA: New Consensus Statement on Inpatient Glycemic Control (2015)  Target Ranges:  Prepandial:   less than 140 mg/dL      Peak postprandial:   less than 180 mg/dL (1-2 hours)      Critically ill patients:  140 - 180 mg/dL   Lab Results  Component Value Date   GLUCAP 168 (H) 09/26/2019   HGBA1C 6.2 (H) 09/25/2019    Review of Glycemic Control Results for Gerald Rosario, Gerald Rosario (MRN [id number]) as of 09/26/2019 09:56  Ref. Range 09/25/2019 08:14 09/25/2019 11:58 09/25/2019 15:36 09/25/2019 20:00 09/26/2019 00:05 09/26/2019 00:37 09/26/2019 01:19 09/26/2019 06:32  Glucose-Capillary Latest Ref Range: 70 - 99 mg/dL 118 (H) 153 (H) 83 120 (H) 61 (L) 64 (L) 111 (H) 168 (H)   Diabetes history: DM 2 Outpatient Diabetes medications: Glipizide 10 mg bid Current orders for Inpatient glycemic control:  Glipizide 10 mg bid ICU scale Novolog 2-6 units tid and hs  Inpatient Diabetes Program Recommendations:    Hypoglycemia 61, 64 this am. Pt on Glipizide known to cause hypoglycemia.  -  D/c glipizide while inpatient  - d/c current ICU Novolog scale and start the regular glycemic control order set Novolog "moderate scale" 0-15 units tid and hs scale.  Thanks, Tama Headings RN, MSN, BC-ADM Inpatient Diabetes Coordinator Team Pager [phone] (8a-5p)

## 2019-09-26 NOTE — Progress Notes (Signed)
0030 Pt CBG showing 61. Pt given applesauce and juice; nectar thick. Will recheck per standing orders.  0040 recheck CBG slightly higher. Will assist pt with more juice or pudding. 0120 Pt CBG back within protocol range. Pt remained asymptomatic throughout.

## 2019-09-26 NOTE — Discharge Instructions (Addendum)
Femoral Site Care This sheet gives you information about how to care for yourself after your procedure. Your health care provider may also give you more specific instructions. If you have problems or questions, contact your health care provider. What can I expect after the procedure? After the procedure, it is common to have:  Bruising that usually fades within 1-2 weeks.  Tenderness at the site. Follow these instructions at home: Wound care 1. Follow instructions from your health care provider about how to take care of your insertion site. Make sure you: ? Wash your hands with soap and water before you change your bandage (dressing). If soap and water are not available, use hand sanitizer. ? Change your dressing as directed- pressure dressing removed 24 hours post-procedure (and switch for bandaid), bandaid removed 72 hours post-procedure 2. Do not take baths, swim, or use a hot tub for 7 days post-procedure. 3. You may shower 48 hours after the procedure or as told by your health care provider. ? Gently wash the site with plain soap and water. ? Pat the area dry with a clean towel. ? Do not rub the site. This may cause bleeding. 4. Check your femoral site every day for signs of infection. Check for: ? Redness, swelling, or pain. ? Fluid or blood. ? Warmth. ? Pus or a bad smell. Activity  Do not stoop, bend, or lift anything that is heavier than 10 lb (4.5 kg) for 2 weeks post-procedure.  Do not drive self for 2 weeks post-procedure. Contact a health care provider if you have:  A fever or chills.  You have redness, swelling, or pain around your insertion site. Get help right away if:  The catheter insertion area swells very fast.  You pass out.  You suddenly start to sweat or your skin gets clammy.  The catheter insertion area is bleeding, and the bleeding does not stop when you hold steady pressure on the area.  The area near or just beyond the catheter insertion site  becomes pale, cool, tingly, or numb. These symptoms may represent a serious problem that is an emergency. Do not wait to see if the symptoms will go away. Get medical help right away. Call your local emergency services (911 in the [place] Do not drive yourself to the hospital.  This information is not intended to replace advice given to you by your health care provider. Make sure you discuss any questions you have with your health care provider. Document Revised: 04/02/2017 Document Reviewed: 04/02/2017 Elsevier Patient Education  2020 [place] on my medicine - ELIQUIS (apixaban)  This medication education was reviewed with me or my healthcare representative as part of my discharge preparation.    Why was Eliquis prescribed for you? Eliquis was prescribed for you to reduce the risk of forming blood clots that can cause a stroke if you have a medical condition called atrial fibrillation (a type of irregular heartbeat) OR to reduce the risk of a blood clots forming after orthopedic surgery.  What do You need to know about Eliquis ? Take your Eliquis TWICE DAILY - one tablet in the morning and one tablet in the evening with or without food.  It would be best to take the doses about the same time each day.  If you have difficulty swallowing the tablet whole please discuss with your pharmacist how to take the medication safely.  Take Eliquis exactly as prescribed by your doctor and DO NOT stop taking Eliquis without  talking to the doctor who prescribed the medication.  Stopping may increase your risk of developing a new clot or stroke.  Refill your prescription before you run out.  After discharge, you should have regular check-up appointments with your healthcare provider that is prescribing your Eliquis.  In the future your dose may need to be changed if your kidney function or weight changes by a significant amount or as you get older.  What do you do if you miss a  dose? If you miss a dose, take it as soon as you remember on the same day and resume taking twice daily.  Do not take more than one dose of ELIQUIS at the same time.  Important Safety Information A possible side effect of Eliquis is bleeding. You should call your healthcare provider right away if you experience any of the following: ? Bleeding from an injury or your nose that does not stop. ? Unusual colored urine (red or dark brown) or unusual colored stools (red or black). ? Unusual bruising for unknown reasons. ? A serious fall or if you hit your head (even if there is no bleeding).  Some medicines may interact with Eliquis and might increase your risk of bleeding or clotting while on Eliquis. To help avoid this, consult your healthcare provider or pharmacist prior to using any new prescription or non-prescription medications, including herbals, vitamins, non-steroidal anti-inflammatory drugs (NSAIDs) and supplements.  This website has more information on Eliquis (apixaban): [email or web address].

## 2019-09-26 NOTE — Consult Note (Signed)
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 difficulty into the middle cerebral artery inferior division M2 M3 region followed by the microcatheter. The Zoom 071 aspiration catheter was then advanced into the left middle cerebral artery and positioned in the distal M1 segment with complete occlusion to aspiration confirming its position within the clot. A 4 mm x 40 mm Solitaire X retrieval device was advanced to the distal end of the microcatheter and deployed in the usual manner by retrieving the microcatheter which was now repositioned more proximally. The  balloon guide was then advanced to the distal cervical left ICA. With proximal flow arrest, and after having aspirated with a Penumbra aspiration device at the hub of the 071 Zoom catheter, and with a 60 mL syringe at the hub of the balloon guide, the combination of the 021 microcatheter, the retrieval device, and the 071 Zoom catheter were retrieved and removed. Proximal flow arrest was reversed. A control arteriogram performed through the balloon guide in the left internal carotid artery demonstrated now complete recanalization of the left internal carotid artery, left anterior cerebral artery, and the left middle cerebral artery to the distal M1 segment. Anterior temporal branch was now patent. A second pass was now made again with the above combination with the microcatheter again positioned in the M2 M3 region of the inferior division. Gentle contrast injection through the microcatheter demonstrated safe position of the tip of the microcatheter. This was connected to continuous heparinized saline infusion. At this time, a 5 mm x 37 mm Embotrap retrieval device was advanced and deployed in the inferior division of the left middle cerebral artery into the distal left MCA M1 segment. The 071 Zoom microcatheter was engaged into the clot at the origin of the inferior division for 2-1/2 minutes with constant aspiration being applied with a Penumbra aspiration device with proximal flow arrest. At the end of this, the combination of the retrieval device, the microcatheter, the Zoom 071 aspiration catheter was again retrieved and removed as constant aspiration was applied at the hub of the balloon guide catheter. Following reversal of the flow arrest, a control arteriogram performed through the left internal carotid artery demonstrated complete angiographic revascularization of the left middle cerebral artery achieving a TICI 3 revascularization. The left anterior cerebral artery and the left posterior communicating  artery also demonstrated wide patency. 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 A control arteriogram performed at 5 minutes thereafter again demonstrated continued wide patency of the left middle cerebral artery, the left anterior cerebral artery and the left posterior communicating artery territories. No change was seen in the 50% stenosis at the left internal carotid artery intracranially, and also involving the petrous cavernous junction. The balloon guide was then gently retrieved  into the left common carotid artery and a control arteriogram performed demonstrated wide patency of the left internal carotid artery proximally and distally. The balloon guide catheter was removed. The 8 French Pinnacle sheath was then removed with hemostasis in the right groin puncture site with an 8 French Angio-Seal closure device. The right groin appeared soft without evidence of hematoma. Distal pulses remained Dopplerable in the dorsalis pedis, and the posterior tibial regions bilaterally unchanged. Throughout the procedure, the patient's blood pressure and neurological status remained stable. A CT of the brain performed on the table demonstrated no evidence of hemorrhage or mass effect or midline shift. The patient was then extubated without difficulty. Upon recovery, the patient moved his left-side spontaneously. He was also able to bend his right knee. However, there was no movement seen in the right upper extremity. Patient was then transferred to the neuro ICU for post thrombectomy management. IMPRESSION: Status post endovascular complete revascularization of occluded left internal carotid artery intracranially and extracranially, and the left middle cerebral artery and the left anterior cerebral artery proximal A1 segment with 1 pass with a 4 mm x 40 mm Solitaire X retrieval device and Penumbra aspiration, and 1 pass with the 5 mm x 37  mm Embotrap retrieval device and Penumbra aspiration achieving a TICI 3 revascularization. PLAN: Follow-up in the clinic 4 weeks post discharge. Electronically Signed   By: Luanne Bras M.D.   On: 09/25/2019 16:39   CT HEAD CODE STROKE WO CONTRAST  Result Date: 09/24/2019 CLINICAL DATA:  Code stroke. Acute neuro deficit. Right-sided weakness and aphasia. EXAM: CT HEAD WITHOUT CONTRAST TECHNIQUE: Contiguous axial images were obtained from the base of the skull through the vertex without intravenous contrast. COMPARISON:  CT head 08/18/2013 FINDINGS: Brain: Motion degraded study. Repeat imaging also degraded by motion. Generalized atrophy. No acute hemorrhage. Small chronic infarct over the left convexity. Evaluation for acute infarct not possible due to motion. Vascular: Hyperdense left MCA compatible with acute thrombus. Skull: Negative Sinuses/Orbits: Mucosal edema throughout the paranasal sinuses with air-fluid level left sphenoid sinus. Prior sinus surgery. Bilateral ocular surgery. Other: None ASPECTS ([place] Stroke Program Early CT Score) Not performed due to motion IMPRESSION: 1. Hyperdense left MCA compatible acute thrombus. 2. ASPECTS is not performed due to extensive motion. Negative for acute hemorrhage. 3. Results texted to Dr. Lorraine Lax Electronically Signed   By: Franchot Gallo M.D.   On: 09/24/2019 11:02   CT ANGIO HEAD CODE STROKE  Result Date: 09/24/2019 CLINICAL DATA:  Stroke.  Right-sided weakness and aphasia. EXAM: CT ANGIOGRAPHY HEAD AND NECK TECHNIQUE: Multidetector CT imaging of the head and neck was performed using the standard protocol during bolus administration of intravenous contrast. Multiplanar CT image reconstructions and MIPs were obtained to evaluate the vascular anatomy. Carotid stenosis measurements (when applicable) are obtained utilizing NASCET criteria, using the distal internal carotid diameter as the denominator. CONTRAST:  48mL OMNIPAQUE IOHEXOL 350 MG/ML SOLN  COMPARISON:  CT head 09/24/2019 FINDINGS: CTA NECK FINDINGS Aortic arch: Atherosclerotic calcification aortic arch. Proximal great vessels widely patent. Right carotid system: Atherosclerotic calcification proximal left internal carotid artery. 40% diameter stenosis proximal left internal carotid artery. Left carotid system: Left common carotid artery is widely patent. Left external carotid artery widely patent. 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 This remains occluded through the cervical segment and through the cavernous segment. There is reconstitution of the supraclinoid internal carotid artery on the left. Vertebral arteries: Both vertebral arteries are patent to the  basilar with scattered atherosclerotic disease but no flow limiting stenosis. Skeleton: Cervical spondylosis.  No acute skeletal abnormality. Other neck: No soft tissue mass or adenopathy in the neck. Upper chest: Lung apices clear bilaterally. 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 Reconstitution of the supraclinoid internal carotid artery on the left. Left anterior cerebral artery widely patent. Abrupt occlusion of the left M1 segment with poor collateral circulation in the left MCA territory. Diffuse atherosclerotic calcification in the right cavernous carotid with moderate stenosis. Anterior and middle cerebral arteries patent on the right. Posterior circulation: Both vertebral arteries patent to the basilar. Mild to moderate stenosis distal vertebral artery due to calcific stenosis bilaterally. Left PICA patent. Right PICA not visualized. Diffuse atherosclerotic disease throughout the basilar with mild to moderate stenosis. Superior cerebellar and posterior cerebral arteries patent bilaterally. Moderate stenosis left P2 segment. Venous sinuses: Negative Anatomic variants: None Review of the  MIP images confirms the above findings IMPRESSION: 1. 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 Left internal carotid artery is occluded through the supraclinoid segment with reconstitution via collaterals. There is extensive atherosclerotic calcification in the cavernous carotid bilaterally. There is abrupt occlusion left M1 segment compatible with acute thrombus. There is poor collateral circulation left MCA territory. 2. 40% diameter stenosis proximal right internal carotid artery. 3. Mild to moderate stenosis distal vertebral artery bilaterally. 4. These results were called by telephone at the time of interpretation on 09/24/2019 at 11:23 am to provider Aroor, who verbally acknowledged these results. Electronically Signed   By: Franchot Gallo M.D.   On: 09/24/2019 11:24   CT ANGIO NECK CODE STROKE  Result Date: 09/24/2019 CLINICAL DATA:  Stroke.  Right-sided weakness and aphasia. EXAM: CT ANGIOGRAPHY HEAD AND NECK TECHNIQUE: Multidetector CT imaging of the head and neck was performed using the standard protocol during bolus administration of intravenous contrast. Multiplanar CT image reconstructions and MIPs were obtained to evaluate the vascular anatomy. Carotid stenosis measurements (when applicable) are obtained utilizing NASCET criteria, using the distal internal carotid diameter as the denominator. CONTRAST:  46mL OMNIPAQUE IOHEXOL 350 MG/ML SOLN COMPARISON:  CT head 09/24/2019 FINDINGS: CTA NECK FINDINGS Aortic arch: Atherosclerotic calcification aortic arch. Proximal great vessels widely patent. Right carotid system: Atherosclerotic calcification proximal left internal carotid artery. 40% diameter stenosis proximal left internal carotid artery. Left carotid system: Left common carotid artery is widely patent. Left external carotid artery widely patent. 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 This remains occluded through the cervical segment and through the cavernous  segment. There is reconstitution of the supraclinoid internal carotid artery on the left. Vertebral arteries: Both vertebral arteries are patent to the basilar with scattered atherosclerotic disease but no flow limiting stenosis. Skeleton: Cervical spondylosis.  No acute skeletal abnormality. Other neck: No soft tissue mass or adenopathy in the neck. Upper chest: Lung apices clear bilaterally. 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 Reconstitution of the supraclinoid internal carotid artery on the left. Left anterior cerebral artery widely patent. Abrupt occlusion of the left M1 segment with poor collateral circulation in the left MCA territory. Diffuse atherosclerotic calcification in the right cavernous carotid with moderate stenosis. Anterior and middle cerebral arteries patent on the right. Posterior circulation: Both vertebral arteries patent to the basilar. Mild to moderate stenosis distal vertebral artery due to  calcific stenosis bilaterally. Left PICA patent. Right PICA not visualized. Diffuse atherosclerotic disease throughout the basilar with mild to moderate stenosis. Superior cerebellar and posterior cerebral arteries patent bilaterally. Moderate stenosis left P2 segment. Venous sinuses: Negative Anatomic variants: None Review of the MIP images confirms the above findings IMPRESSION: 1. 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 Left internal carotid artery is occluded through the supraclinoid segment with reconstitution via collaterals. There is extensive atherosclerotic calcification in the cavernous carotid bilaterally. There is abrupt occlusion left M1 segment compatible with acute thrombus. There is poor collateral circulation left MCA territory. 2. 40% diameter stenosis proximal right internal carotid artery. 3. Mild to moderate stenosis distal vertebral artery  bilaterally. 4. These results were called by telephone at the time of interpretation on 09/24/2019 at 11:23 am to provider Aroor, who verbally acknowledged these results. Electronically Signed   By: Franchot Gallo M.D.   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 Does the need for close, 24 hr/day medical supervision in concert with the patient's rehab needs make it unreasonable for this patient to be served in a less intensive setting? Yes 2. Co-Morbidities requiring supervision/potential complications: Right sided weakness, global aphasia, arrhythmia, HTN, HLD, diabetes type 2 controlled, dysphagia  3. Due to bladder management, bowel management, safety, skin/wound care, disease management, medication administration, pain management and patient education, does the patient require 24 hr/day rehab nursing? Yes 4. Does the patient require coordinated care of a physician, rehab nurse, therapy disciplines of PT, OT, SLP to address physical and functional deficits in the context of the above medical diagnosis(es)? Yes Addressing deficits in the following areas: balance, endurance, locomotion, strength, transferring, bowel/bladder control, bathing, dressing, feeding, grooming, toileting, cognition, speech, language, swallowing and psychosocial support 5. Can the patient actively participate in an intensive therapy program of at least 3 hrs of therapy per day at least 5 days per week? Yes 6. The potential for patient to make measurable gains while on inpatient rehab is excellent 7. Anticipated functional outcomes upon discharge from inpatient rehab are supervision  with PT, supervision with OT, supervision with SLP. 8. Estimated rehab length of stay to reach the above functional goals is: 20-22 days 9. Anticipated discharge destination: Home 10. Overall Rehab/Functional Prognosis: excellent  RECOMMENDATIONS: This patient's condition  is appropriate for continued rehabilitative care in the following setting: CIR Patient has agreed to participate in recommended program. N/A Note that insurance prior authorization may be required for reimbursement for recommended care.  Comment: Mr. Decou is an excellent CIR candidate. He has needs across PT, OT, and SLP. He will need to have 24/7 supervision upon discharge and family support will need to be confirmed prior to acceptance to rehab.   Bary Leriche, PA-C 09/26/2019   I have personally performed a face to face diagnostic evaluation, including, but not limited to relevant history and physical exam findings, of this patient and developed relevant assessment and plan.  Additionally, I have reviewed and concur with the physician assistant's documentation above.  Leeroy Cha, MD

## 2019-09-26 NOTE — Progress Notes (Addendum)
Inpatient Rehabilitation-Admissions Coordinator   Met with pt bedside as follow up from PM&R consult. Please see consult note from Dr. Ranell Patrick for details. While the patient is an appropriate candidate for CIR I will need to confirm 24/7 Supervision at DC. If this cannot be confirmed, the patient will need SNF placement due to safety concerns and insurance barriers. AC has left voicemail for patient's daughter. Noted per chart review she [place] of [place] and it appears the patient has little, if any assitance here in town. AC will follow up once I have heard back form his daughter regarding dispo.   Please call if questions.   Raechel Ache, OTR/L  Rehab Admissions Coordinator  [phone] 09/26/2019 12:40 PM  Addendum: 2:41PM Spoke with pt's daughter. Confirmed lack of DC support required for CIR. Pt will need SNF at this time. AC has notified TOC team. AC will sign off.   Raechel Ache, OTR/L  Rehab Admissions Coordinator  [phone] 09/26/2019 2:42 PM
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
## 2019-09-26 NOTE — Evaluation (Signed)
Occupational Therapy Evaluation Patient Details Name: Gerald Rosario MRN: [id number] DOB: 1945-11-05 Today's Date: 09/26/2019    History of Present Illness 74 y.o. male with history of prostate cancer, CABG x3, PAF, ischemic cardiomyopathy, hypertension, hyperlipidemia, diabetes and CAD.  Patient was at his primary care physician's office today to further evaluate episodes of losing strength in all extremities.  While he was at the primary care office patient had sudden onset of left gaze deviation and right-sided weakness. 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 Pt went to IR on 6/23 for revacularization.   Clinical Impression   Pt admitted due to above. Due to expressive and receptive communication limitations, pt unable to provide prior level of functioning and home information. No family/caregiver present and unable to reach them at this time. Pt currently requires moderate assistance for self-feeding with support at elbow and hand of RUE for coordinated movements with utensil manipulation and hand to mouth movements. Pt stating "remember" throughout the session. Pt did follow simple one step commands <50% of the time with increased time for processing. Due to decline in current level of function, pt would benefit from acute OT to address established goals to facilitate safe D/C to venue listed below. At this time, recommend SNF follow-up. Will continue to follow acutely.     Follow Up Recommendations  SNF    Equipment Recommendations  3 in 1 bedside commode    Recommendations for Other Services       Precautions / Restrictions Precautions Precautions: Fall Restrictions Weight Bearing Restrictions: No      Mobility Bed Mobility Overal bed mobility: Needs Assistance Bed Mobility: Sit to Supine       Sit to supine: Min guard   General bed mobility comments: minguard for safety;  Transfers Overall transfer level: Needs assistance Equipment used: 1 person hand  held assist Transfers: Sit to/from Stand Sit to Stand: Min assist         General transfer comment: minA for stability and support;right knee buckled x2, sit<>stand from EOB x3    Balance Overall balance assessment: Needs assistance Sitting-balance support: No upper extremity supported;Feet supported Sitting balance-Leahy Scale: Poor Sitting balance - Comments: minguard for sitting balance   Standing balance support: Single extremity supported Standing balance-Leahy Scale: Poor Standing balance comment: minA for standing balance at this time;modA for weight shifting due to right knee buckle 2/3                           ADL either performed or assessed with clinical judgement   ADL Overall ADL's : Needs assistance/impaired Eating/Feeding: Moderate assistance;Sitting Eating/Feeding Details (indicate cue type and reason): modA to manipulate utensils, pace eating Grooming: Moderate assistance;Sitting   Upper Body Bathing: Moderate assistance;Sitting   Lower Body Bathing: Moderate assistance;Sit to/from stand   Upper Body Dressing : Sitting;Minimal assistance   Lower Body Dressing: Moderate assistance;Sit to/from stand                 General ADL Comments: pt limited to EOB ADL and sit<>stand from EOB x3 with 2x right knee buckle     Vision Patient Visual Report:  (unsure) Vision Assessment?: Vision impaired- to be further tested in functional context Additional Comments: pt with left gaze preference, able to look towards right (therapist sat on pt's right side during feeding);difficult to fully assess due to impaired cognition/communication     Perception     Praxis  Pertinent Vitals/Pain Pain Assessment: Faces Faces Pain Scale: No hurt Pain Intervention(s): Monitored during session     Hand Dominance Right   Extremity/Trunk Assessment Upper Extremity Assessment Upper Extremity Assessment: RUE deficits/detail RUE Deficits / Details:  shoulder AROM about 70*;no elbow flexion;pt able to maintain loose grasp on fork, unable to manipulate fork to scoop food items;decreased grip strength to maintian grasp on items;pt with inattention to RUE RUE Coordination: decreased fine motor;decreased gross motor   Lower Extremity Assessment Lower Extremity Assessment: Defer to PT evaluation RLE Deficits / Details: R knee buckled x2 with standing x3   Cervical / Trunk Assessment Cervical / Trunk Assessment: Normal   Communication Communication Communication: Receptive difficulties;Expressive difficulties   Cognition Arousal/Alertness: Awake/alert Behavior During Therapy: WFL for tasks assessed/performed Overall Cognitive Status: Difficult to assess                                 General Comments: pt followed one step commands <50% of the time;pt able to return demonstrate thumb up and thumb down with visual demonstration and physical demonstration;pt utilized thumb up to answer if food items were good/bad usure accuracy of usage;pt repeatedly stating "remember"   General Comments  vss on RA    Exercises     Shoulder Instructions      Home Living Family/patient expects to be discharged to:: Unsure                                 Additional Comments: pt unable to report history 2/2 expressive aphasia. Per RN pt has estranged dtr in [place] who is unable to provide support at time of discharge. Seems pt has no other caregivers available      Prior Functioning/Environment          Comments: unsure, per prior chart review in 2015 pt was independent        OT Problem List: Decreased range of motion;Decreased activity tolerance;Decreased strength;Impaired balance (sitting and/or standing);Decreased coordination;Decreased cognition;Decreased safety awareness;Decreased knowledge of use of DME or AE;Impaired tone;Impaired UE functional use      OT Treatment/Interventions: Self-care/ADL  training;Therapeutic exercise;Energy conservation;DME and/or AE instruction;Therapeutic activities;Cognitive remediation/compensation;Patient/family education;Balance training    OT Goals(Current goals can be found in the care plan section) Acute Rehab OT Goals Patient Stated Goal: pt unable to state, PT goal to improve mobility and reduce falls risk OT Goal Formulation: Patient unable to participate in goal setting Time For Goal Achievement: 10/10/19 Potential to Achieve Goals: Good ADL Goals Pt Will Perform Eating: with set-up;sitting Pt Will Transfer to Toilet: with min guard assist;ambulating Additional ADL Goal #1: Pt will follow multistep cognitive task with less than 3 errors for safe progression of ADL/IADL. Additional ADL Goal #2: Pt will gather 3-5 items needed for ADL completion with minimal verbal cues. Additional ADL Goal #3: Pt will independently initiate use of RUE during ADL completion.  OT Frequency: Min 2X/week   Barriers to D/C: Decreased caregiver support  unsure available support at d/c       Co-evaluation              AM-PAC OT "6 Clicks" Daily Activity     Outcome Measure Help from another person eating meals?: A Lot Help from another person taking care of personal grooming?: A Lot Help from another person toileting, which includes using toliet, bedpan, or urinal?: A Lot  Help from another person bathing (including washing, rinsing, drying)?: A Lot Help from another person to put on and taking off regular upper body clothing?: A Little Help from another person to put on and taking off regular lower body clothing?: A Lot 6 Click Score: 13   End of Session Nurse Communication: Mobility status  Activity Tolerance: Patient tolerated treatment well Patient left: in bed;with call bell/phone within reach;with bed alarm set (in upright position)  OT Visit Diagnosis: Other abnormalities of gait and mobility (R26.89);Unsteadiness on feet (R26.81);Muscle weakness  (generalized) (M62.81);Feeding difficulties (R63.3);Other symptoms and signs involving cognitive function;Hemiplegia and hemiparesis Hemiplegia - Right/Left: Right Hemiplegia - dominant/non-dominant: Dominant Hemiplegia - caused by: Cerebral infarction                Time: 1430-1503 OT Time Calculation (min): 33 min Charges:  OT General Charges $OT Visit: 1 Visit OT Evaluation $OT Eval Moderate Complexity: 1 Mod OT Treatments $Self Care/Home Management : 8-22 mins  Helene Kelp OTR/L Acute Rehabilitation Services Office: [phone]   Wyn Forster 09/26/2019, 3:26 PM

## 2019-09-26 NOTE — Progress Notes (Signed)
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 DETACHMENT SURGERY Right 01/06/2014  @ [facility] . TRANSURETHRAL RESECTION OF PROSTATE  12/26/2011  Procedure: TRANSURETHRAL RESECTION OF THE PROSTATE (TURP);  Surgeon: Fredricka Bonine, MD;  Location: [facility];  Service: Urology;  Laterality: N/A;  Greenlight PVP laser of Prostate . TRANSURETHRAL RESECTION OF PROSTATE N/A 01/07/2013  Procedure: TRANSURETHRAL RESECTION OF THE PROSTATE WITH GYRUS INSTRUMENTS;  Surgeon: Fredricka Bonine, MD;  Location: [facility];  Service: Urology;  Laterality: N/A; . WISDOM TOOTH EXTRACTION    wisdom teeth extracted. HPI: Pt is a 74 yo male presenting with fluctuating weakness. Upon arrival at the ED pt had a sudden onset pof R weakness, L eye deviation, and aphasia. CT showed a hyperdensity in the L MCA compatible with acute thrombus; s/p TPA and emergent mechanical thrombectomy. 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 His oral phase is grossly functional with the exception of needing additional time for mastication. He is impulsive, taking very large cup sips at a time despite hand-over-hand assist from SLP to try to reduce his volume. His swallow is only mildly delayed, but his pharynx becomes filled with liquids and thin liquids spill over into the airway before the swallow. He also has reduced anterior hyoid movement, base of tongue retraction, and airway closure (suspect involvement of baseline impaired vocal  cord), so all liquids via cup are aspirated during the swallow. His aspiration is primarily silent, with occasional delayed coughs noted across testing but not consistently. His airway protection is improved with SLP providing regulation via single spoonfuls at a time. Recommend starting Dys 2 (chopped) diet and nectar thick liquids via spoon only.  SLP Visit Diagnosis Dysphagia, oropharyngeal phase (R13.12) Attention and concentration deficit following -- Frontal lobe and executive function deficit following -- Impact on safety and function Moderate aspiration risk   CHL IP TREATMENT RECOMMENDATION 09/25/2019 Treatment Recommendations Therapy as outlined in treatment plan below   Prognosis 09/25/2019 Prognosis for Safe Diet Advancement Good Barriers to Reach Goals Language deficits Barriers/Prognosis Comment -- CHL IP DIET RECOMMENDATION 09/25/2019 SLP Diet Recommendations Dysphagia 2 (Fine chop) solids;Nectar thick liquid Liquid Administration via Spoon Medication Administration Crushed with puree Compensations Slow rate;Small sips/bites Postural Changes Seated upright at 90  degrees   CHL IP OTHER RECOMMENDATIONS 09/25/2019 Recommended Consults -- Oral Care Recommendations Oral care BID Other Recommendations Order thickener from pharmacy;Prohibited food (jello, ice cream, thin soups);Remove water pitcher   CHL IP FOLLOW UP RECOMMENDATIONS 09/25/2019 Follow up Recommendations Inpatient Rehab   CHL IP FREQUENCY AND DURATION 09/25/2019 Speech Therapy Frequency (ACUTE ONLY) min 2x/week Treatment Duration 2 weeks      CHL IP ORAL PHASE 09/25/2019 Oral Phase Impaired Oral - Pudding Teaspoon -- Oral - Pudding Cup -- Oral - Honey Teaspoon WFL Oral - Honey Cup WFL Oral - Nectar Teaspoon WFL Oral - Nectar Cup WFL Oral - Nectar Straw -- Oral - Thin Teaspoon WFL Oral - Thin Cup WFL Oral - Thin Straw -- Oral - Puree WFL Oral - Mech Soft Impaired mastication Oral - Regular -- Oral - Multi-Consistency -- Oral - Pill -- Oral Phase - Comment --  CHL IP PHARYNGEAL PHASE 09/25/2019 Pharyngeal Phase Impaired Pharyngeal- Pudding Teaspoon -- Pharyngeal -- Pharyngeal- Pudding Cup -- Pharyngeal -- Pharyngeal- Honey Teaspoon Delayed swallow initiation-vallecula;Reduced anterior laryngeal mobility;Reduced airway/laryngeal closure;Reduced tongue base retraction Pharyngeal -- Pharyngeal- Honey Cup Reduced anterior laryngeal mobility;Reduced airway/laryngeal closure;Reduced tongue base retraction;Delayed swallow initiation-pyriform sinuses;Penetration/Aspiration during swallow;Pharyngeal residue - valleculae Pharyngeal Material enters airway, passes BELOW cords without attempt by patient to eject out (silent aspiration) Pharyngeal- Nectar Teaspoon Delayed swallow initiation-vallecula;Reduced anterior laryngeal mobility;Reduced airway/laryngeal closure;Reduced tongue base retraction Pharyngeal -- Pharyngeal- Nectar Cup Reduced anterior laryngeal mobility;Reduced airway/laryngeal closure;Reduced tongue base retraction;Delayed swallow initiation-pyriform sinuses;Penetration/Aspiration during swallow;Pharyngeal residue -  valleculae Pharyngeal Material enters airway, passes BELOW cords without attempt by patient to eject out (silent aspiration) Pharyngeal- Nectar Straw -- Pharyngeal -- Pharyngeal- Thin Teaspoon Reduced anterior laryngeal mobility;Reduced airway/laryngeal closure;Reduced tongue base retraction;Delayed swallow initiation-pyriform sinuses;Penetration/Aspiration during swallow;Pharyngeal residue - valleculae Pharyngeal Material enters airway, passes BELOW cords without attempt by patient to eject out (silent aspiration) Pharyngeal- Thin Cup Reduced anterior laryngeal mobility;Reduced airway/laryngeal closure;Reduced tongue base retraction;Delayed swallow initiation-pyriform sinuses;Penetration/Aspiration during swallow;Pharyngeal residue - valleculae Pharyngeal Material enters airway, passes BELOW cords without attempt by patient to eject out (silent aspiration) Pharyngeal- Thin Straw -- Pharyngeal -- Pharyngeal- Puree Delayed swallow initiation-vallecula;Reduced anterior laryngeal mobility;Reduced airway/laryngeal closure;Reduced tongue base retraction Pharyngeal -- Pharyngeal- Mechanical Soft Delayed swallow initiation-vallecula;Reduced anterior laryngeal mobility;Reduced airway/laryngeal closure;Reduced tongue base retraction Pharyngeal -- Pharyngeal- Regular -- Pharyngeal -- Pharyngeal- Multi-consistency -- Pharyngeal -- Pharyngeal- Pill -- Pharyngeal -- Pharyngeal Comment --  CHL IP CERVICAL ESOPHAGEAL PHASE 09/25/2019 Cervical Esophageal Phase WFL Pudding Teaspoon -- Pudding Cup -- Honey Teaspoon -- Honey Cup -- Nectar Teaspoon -- Nectar Cup -- Nectar Straw -- Thin Teaspoon --  Thin Cup -- Thin Straw -- Puree -- Mechanical Soft -- Regular -- Multi-consistency -- Pill -- Cervical Esophageal Comment -- Osie Bond., M.A. [place] Pager [phone] Office [phone] 09/25/2019, 3:56 PM              ECHOCARDIOGRAM COMPLETE  Result Date: 09/25/2019    ECHOCARDIOGRAM REPORT   Patient Name:    Gerald Rosario [facility] Date of Exam: 09/25/2019 Medical Rec #:  [id number]     Height:       68.0 in Accession #:    [id number]    Weight:       144.6 lb Date of Birth:  1945/10/14     BSA:          1.781 m Patient Age:    63 years      BP:           147/43 mmHg Patient Gender: M             HR:           78 bpm. Exam Location:  Inpatient Procedure: 2D Echo, Color Doppler, Cardiac Doppler and Intracardiac            Opacification Agent Indications:    Stroke i163.9  History:        Patient has prior history of Echocardiogram examinations, most                 recent 01/23/2014. Prior CABG, Arrythmias:Atrial Fibrillation;                 Risk Factors:Hypertension, Diabetes and Dyslipidemia. LAA                 clipped during CABG.  Sonographer:    Raquel Sarna Senior RDCS Referring Phys: [place]  1. Left ventricular ejection fraction, by estimation, is 50 to 55%. The left ventricle has low normal function. The left ventricle demonstrates regional wall motion abnormalities. Apical akinesis. Sluggish flow at apex but no clear thrombus seen. Left ventricular diastolic parameters are consistent with Grade III diastolic dysfunction (restrictive). Elevated left atrial pressure.  2. Right ventricular systolic function is normal. The right ventricular size is normal. There is moderately elevated pulmonary artery systolic pressure. The estimated right ventricular systolic pressure is 52.8 mmHg.  3. Left atrial size was moderately dilated.  4. The mitral valve is degenerative. Moderate mitral annular calcification. Trivial mitral valve regurgitation.  5. The aortic valve is tricuspid. Aortic valve regurgitation is not visualized. Mild to moderate aortic valve sclerosis/calcification is present, without any evidence of aortic stenosis.  6. The inferior vena cava is dilated in size with <50% respiratory variability, suggesting right atrial pressure of 15 mmHg.  7. Mobile rounded echodensity attached to norcoronary cusp of  aortic valve on aortic side of valve measuring 0.7 x 0.5 cm, seen on parasternal long axis images. 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 Given presentation with CVA, recommend TEE for further evaluation FINDINGS  Left Ventricle: Left ventricular ejection fraction, by estimation, is 50 to 55%. The left ventricle has low normal function. The left ventricle demonstrates regional wall motion abnormalities. Definity contrast agent was given IV to delineate the left ventricular endocardial borders. The left ventricular internal cavity size was normal in size. There is no left ventricular hypertrophy. Left ventricular diastolic parameters are consistent with Grade III diastolic dysfunction (restrictive). Elevated left atrial pressure. Right Ventricle: The right ventricular size is normal. Right vetricular wall thickness was not assessed. Right ventricular systolic function  is normal. There is moderately elevated pulmonary artery systolic pressure. The tricuspid regurgitant velocity is  2.86 m/s, and with an assumed right atrial pressure of 15 mmHg, the estimated right ventricular systolic pressure is 54.0 mmHg. Left Atrium: Left atrial size was moderately dilated. Right Atrium: Right atrial size was normal in size. Pericardium: There is no evidence of pericardial effusion. Mitral Valve: The mitral valve is degenerative in appearance. Moderate mitral annular calcification. Trivial mitral valve regurgitation. Tricuspid Valve: The tricuspid valve is normal in structure. Tricuspid valve regurgitation is mild. Aortic Valve: Mobile echodensity attached to norcoronary cusp of aortic valve on aortic side of valve measuring 0.7 x 0.5 cm. Given presentation with CVA, recommend TEE for further evaluation. The aortic valve is tricuspid. Aortic valve regurgitation is not visualized. Mild to moderate aortic valve sclerosis/calcification is present, without any evidence of aortic stenosis. Pulmonic Valve: The pulmonic valve was grossly  normal. Pulmonic valve regurgitation is trivial. Aorta: The aortic root and ascending aorta are structurally normal, with no evidence of dilitation. Venous: The inferior vena cava is dilated in size with less than 50% respiratory variability, suggesting right atrial pressure of 15 mmHg. IAS/Shunts: The interatrial septum was not well visualized.  LEFT VENTRICLE PLAX 2D LVIDd:         5.70 cm  Diastology LVIDs:         3.80 cm  LV e' lateral:   8.92 cm/s LV PW:         1.10 cm  LV E/e' lateral: 10.3 LV IVS:        0.80 cm  LV e' medial:    7.83 cm/s LVOT diam:     2.00 cm  LV E/e' medial:  11.7 LV SV:         68 LV SV Index:   38 LVOT Area:     3.14 cm  RIGHT VENTRICLE RV S prime:     11.60 cm/s TAPSE (M-mode): 1.8 cm LEFT ATRIUM             Index       RIGHT ATRIUM           Index LA diam:        5.10 cm 2.86 cm/m  RA Area:     18.70 cm LA Vol (A2C):   63.2 ml 35.49 ml/m RA Volume:   49.90 ml  28.02 ml/m LA Vol (A4C):   82.1 ml 46.11 ml/m LA Biplane Vol: 76.3 ml 42.85 ml/m  AORTIC VALVE LVOT Vmax:   98.60 cm/s LVOT Vmean:  67.600 cm/s LVOT VTI:    0.218 m  AORTA Ao Root diam: 3.30 cm Ao Asc diam:  2.80 cm MITRAL VALVE               TRICUSPID VALVE MV Area (PHT): 4.93 cm    TR Peak grad:   32.7 mmHg MV Decel Time: 154 msec    TR Vmax:        286.00 cm/s MV E velocity: 91.70 cm/s MV A velocity: 44.60 cm/s  SHUNTS MV E/A ratio:  2.06        Systemic VTI:  0.22 m                            Systemic Diam: 2.00 cm Oswaldo Milian MD Electronically signed by Oswaldo Milian MD Signature Date/Time: 09/25/2019/12:57:55 PM    Final    IR PERCUTANEOUS ART THROMBECTOMY/INFUSION INTRACRANIAL INC DIAG ANGIO  Result Date: 09/26/2019 INDICATION: New onset of aphasia, left gaze deviation and right-sided hemiplegia. 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 EXAM:  1. EMERGENT LARGE VESSEL OCCLUSION THROMBOLYSIS (anterior CIRCULATION) COMPARISON:  CT angiogram of the head and neck of 09/24/2019. MEDICATIONS: Ancef 2 g IV antibiotic was administered within 1 hour of the procedure. ANESTHESIA/SEDATION: General anesthesia CONTRAST:  Isovue 300 approximately 95 cc FLUOROSCOPY TIME:  Fluoroscopy Time: 40 minutes 18 seconds (1708 mGy). COMPLICATIONS: None immediate. TECHNIQUE: Following a full explanation of the procedure along with the potential associated complications, an informed witnessed consent was obtained from the patient's daughter. The risks of intracranial hemorrhage of 10%, worsening neurological deficit, ventilator dependency, death and inability to revascularize were all reviewed in detail with the patient's daughter. The patient was then put under general anesthesia by the Department of Anesthesiology at [facility]. The right groin was prepped and draped in the usual sterile fashion. Thereafter using modified Seldinger technique, transfemoral access into the right common femoral artery was obtained without difficulty. Over a 0.035 inch guidewire a 8 French 25 cm Pinnacle sheath was inserted. Through this, and also over a 0.035 inch guidewire a 5 French Bernstein 125 cm inside of an Eden balloon guide catheter combination was advanced to the left common carotid artery. The guidewire and the support catheter were then removed. Good aspiration was obtained from the balloon guide just proximal to the left internal carotid artery. FINDINGS: A gentle control arteriogram performed through this again demonstrated continued occlusion of the left internal carotid artery in its entirety with no distal reconstitution intracranially from the external carotid artery branches. PROCEDURE: Over a 0.014 inch standard Synchro micro guidewire with a J configuration, an 021 microcatheter inside of a 135 cm 071 Zoom aspiration catheter was advanced without difficulty to the  cavernous segment of the right internal carotid artery. The micro guidewire was then gently manipulated without difficulty into the middle cerebral artery inferior division M2 M3 region followed by the microcatheter. The Zoom 071 aspiration catheter was then advanced into the left middle cerebral artery and positioned in the distal M1 segment with complete occlusion to aspiration confirming its position within the clot. A 4 mm x 40 mm Solitaire X retrieval device was advanced to the distal end of the microcatheter and deployed in the usual manner by retrieving the microcatheter which was now repositioned more proximally. The balloon guide was then advanced to the distal cervical left ICA. With proximal flow arrest, and after having aspirated with a Penumbra aspiration device at the hub of the 071 Zoom catheter, and with a 60 mL syringe at the hub of the balloon guide, the combination of the 021 microcatheter, the retrieval device, and the 071 Zoom catheter were retrieved and removed. Proximal flow arrest was reversed. A control arteriogram performed through the balloon guide in the left internal carotid artery demonstrated now complete recanalization of the left internal carotid artery, left anterior cerebral artery, and the left middle cerebral artery to the distal M1 segment. Anterior temporal branch was now patent. A second pass was now made again with the above combination with the microcatheter again positioned in the M2 M3 region of the inferior division. Gentle contrast injection through the microcatheter demonstrated safe position of the tip of the microcatheter. This was connected to continuous heparinized saline infusion. At this time, a 5 mm  x 37 mm Embotrap retrieval device was advanced and deployed in the inferior division of the left middle cerebral artery into the distal left MCA M1 segment. The 071 Zoom microcatheter was engaged into the clot at the origin of the inferior division for 2-1/2 minutes  with constant aspiration being applied with a Penumbra aspiration device with proximal flow arrest. At the end of this, the combination of the retrieval device, the microcatheter, the Zoom 071 aspiration catheter was again retrieved and removed as constant aspiration was applied at the hub of the balloon guide catheter. Following reversal of the flow arrest, a control arteriogram performed through the left internal carotid artery demonstrated complete angiographic revascularization of the left middle cerebral artery achieving a TICI 3 revascularization. The left anterior cerebral artery and the left posterior communicating artery also demonstrated wide patency. 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 A control arteriogram performed at 5 minutes thereafter again demonstrated continued wide patency of the left middle cerebral artery, the left anterior cerebral artery and the left posterior communicating artery territories. No change was seen in the 50% stenosis at the left internal carotid artery intracranially, and also involving the petrous cavernous junction. The balloon guide was then gently retrieved into the left common carotid artery and a control arteriogram performed demonstrated wide patency of the left internal carotid artery proximally and distally. The balloon guide catheter was removed. The 8 French Pinnacle sheath was then removed with hemostasis in the right groin puncture site with an 8 French Angio-Seal closure device. The right groin appeared soft without evidence of hematoma. Distal pulses remained Dopplerable in the dorsalis pedis, and the posterior tibial regions bilaterally unchanged. Throughout the procedure, the patient's blood pressure and neurological status remained stable. A CT of the brain performed on the table demonstrated no evidence of hemorrhage or mass effect or midline shift. The patient was then  extubated without difficulty. Upon recovery, the patient moved his left-side spontaneously. He was also able to bend his right knee. However, there was no movement seen in the right upper extremity. Patient was then transferred to the neuro ICU for post thrombectomy management. IMPRESSION: Status post endovascular complete revascularization of occluded left internal carotid artery intracranially and extracranially, and the left middle cerebral artery and the left anterior cerebral artery proximal A1 segment with 1 pass with a 4 mm x 40 mm Solitaire X retrieval device and Penumbra aspiration, and 1 pass with the 5 mm x 37 mm Embotrap retrieval device and Penumbra aspiration achieving a TICI 3 revascularization. PLAN: Follow-up in the clinic 4 weeks post discharge. Electronically Signed   By: Luanne Bras M.D.   On: 09/25/2019 16:39   CT HEAD CODE STROKE WO CONTRAST  Result Date: 09/24/2019 CLINICAL DATA:  Code stroke. Acute neuro deficit. Right-sided weakness and aphasia. EXAM: CT HEAD WITHOUT CONTRAST TECHNIQUE: Contiguous axial images were obtained from the base of the skull through the vertex without intravenous contrast. COMPARISON:  CT head 08/18/2013 FINDINGS: Brain: Motion degraded study. Repeat imaging also degraded by motion. Generalized atrophy. No acute hemorrhage. Small chronic infarct over the left convexity. Evaluation for acute infarct not possible due to motion. Vascular: Hyperdense left MCA compatible with acute thrombus. Skull: Negative Sinuses/Orbits: Mucosal edema throughout the paranasal sinuses with air-fluid level left sphenoid sinus. Prior sinus surgery. Bilateral ocular surgery. Other: None ASPECTS ([place] Stroke Program Early CT Score) Not performed due to motion IMPRESSION: 1. Hyperdense left  MCA compatible acute thrombus. 2. ASPECTS is not performed due to extensive motion. Negative for acute hemorrhage. 3. Results texted to Dr. Lorraine Lax Electronically Signed   By: Franchot Gallo  M.D.   On: 09/24/2019 11:02   CT ANGIO HEAD CODE STROKE  Result Date: 09/24/2019 CLINICAL DATA:  Stroke.  Right-sided weakness and aphasia. EXAM: CT ANGIOGRAPHY HEAD AND NECK TECHNIQUE: Multidetector CT imaging of the head and neck was performed using the standard protocol during bolus administration of intravenous contrast. Multiplanar CT image reconstructions and MIPs were obtained to evaluate the vascular anatomy. Carotid stenosis measurements (when applicable) are obtained utilizing NASCET criteria, using the distal internal carotid diameter as the denominator. CONTRAST:  80mL OMNIPAQUE IOHEXOL 350 MG/ML SOLN COMPARISON:  CT head 09/24/2019 FINDINGS: CTA NECK FINDINGS Aortic arch: Atherosclerotic calcification aortic arch. Proximal great vessels widely patent. Right carotid system: Atherosclerotic calcification proximal left internal carotid artery. 40% diameter stenosis proximal left internal carotid artery. Left carotid system: Left common carotid artery is widely patent. Left external carotid artery widely patent. 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 This remains occluded through the cervical segment and through the cavernous segment. There is reconstitution of the supraclinoid internal carotid artery on the left. Vertebral arteries: Both vertebral arteries are patent to the basilar with scattered atherosclerotic disease but no flow limiting stenosis. Skeleton: Cervical spondylosis.  No acute skeletal abnormality. Other neck: No soft tissue mass or adenopathy in the neck. Upper chest: Lung apices clear bilaterally. 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 Reconstitution of the supraclinoid internal carotid artery on the left. Left anterior cerebral artery widely patent. Abrupt occlusion of the left M1 segment with poor collateral circulation in the left MCA  territory. Diffuse atherosclerotic calcification in the right cavernous carotid with moderate stenosis. Anterior and middle cerebral arteries patent on the right. Posterior circulation: Both vertebral arteries patent to the basilar. Mild to moderate stenosis distal vertebral artery due to calcific stenosis bilaterally. Left PICA patent. Right PICA not visualized. Diffuse atherosclerotic disease throughout the basilar with mild to moderate stenosis. Superior cerebellar and posterior cerebral arteries patent bilaterally. Moderate stenosis left P2 segment. Venous sinuses: Negative Anatomic variants: None Review of the MIP images confirms the above findings IMPRESSION: 1. 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 Left internal carotid artery is occluded through the supraclinoid segment with reconstitution via collaterals. There is extensive atherosclerotic calcification in the cavernous carotid bilaterally. There is abrupt occlusion left M1 segment compatible with acute thrombus. There is poor collateral circulation left MCA territory. 2. 40% diameter stenosis proximal right internal carotid artery. 3. Mild to moderate stenosis distal vertebral artery bilaterally. 4. These results were called by telephone at the time of interpretation on 09/24/2019 at 11:23 am to provider Aroor, who verbally acknowledged these results. Electronically Signed   By: Franchot Gallo M.D.   On: 09/24/2019 11:24   CT ANGIO NECK CODE STROKE  Result Date: 09/24/2019 CLINICAL DATA:  Stroke.  Right-sided weakness and aphasia. EXAM: CT ANGIOGRAPHY HEAD AND NECK TECHNIQUE: Multidetector CT imaging of the head and neck was performed using the standard protocol during bolus administration of intravenous contrast. Multiplanar CT image reconstructions and MIPs were obtained to evaluate the vascular anatomy. Carotid stenosis measurements (when applicable) are obtained utilizing NASCET criteria, using the distal internal carotid diameter  as the denominator. CONTRAST:  21mL OMNIPAQUE IOHEXOL 350 MG/ML SOLN COMPARISON:  CT  head 09/24/2019 FINDINGS: CTA NECK FINDINGS Aortic arch: Atherosclerotic calcification aortic arch. Proximal great vessels widely patent. Right carotid system: Atherosclerotic calcification proximal left internal carotid artery. 40% diameter stenosis proximal left internal carotid artery. Left carotid system: Left common carotid artery is widely patent. Left external carotid artery widely patent. 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 This remains occluded through the cervical segment and through the cavernous segment. There is reconstitution of the supraclinoid internal carotid artery on the left. Vertebral arteries: Both vertebral arteries are patent to the basilar with scattered atherosclerotic disease but no flow limiting stenosis. Skeleton: Cervical spondylosis.  No acute skeletal abnormality. Other neck: No soft tissue mass or adenopathy in the neck. Upper chest: Lung apices clear bilaterally. 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 Reconstitution of the supraclinoid internal carotid artery on the left. Left anterior cerebral artery widely patent. Abrupt occlusion of the left M1 segment with poor collateral circulation in the left MCA territory. Diffuse atherosclerotic calcification in the right cavernous carotid with moderate stenosis. Anterior and middle cerebral arteries patent on the right. Posterior circulation: Both vertebral arteries patent to the basilar. Mild to moderate stenosis distal vertebral artery due to calcific stenosis bilaterally. Left PICA patent. Right PICA not visualized. Diffuse atherosclerotic disease throughout the basilar with mild to moderate stenosis. Superior cerebellar and posterior cerebral arteries patent bilaterally. Moderate stenosis left P2  segment. Venous sinuses: Negative Anatomic variants: None Review of the MIP images confirms the above findings IMPRESSION: 1. 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 Left internal carotid artery is occluded through the supraclinoid segment with reconstitution via collaterals. There is extensive atherosclerotic calcification in the cavernous carotid bilaterally. There is abrupt occlusion left M1 segment compatible with acute thrombus. There is poor collateral circulation left MCA territory. 2. 40% diameter stenosis proximal right internal carotid artery. 3. Mild to moderate stenosis distal vertebral artery bilaterally. 4. These results were called by telephone at the time of interpretation on 09/24/2019 at 11:23 am to provider Aroor, who verbally acknowledged these results. Electronically Signed   By: Franchot Gallo M.D.   On: 09/24/2019 11:24    Labs:  CBC: Recent Labs    03/21/19 0928 09/24/19 1046 09/24/19 1052 09/25/19 0444  WBC 8.0 10.4  --  10.6*  HGB 12.6* 11.9* 12.6* 10.8*  HCT 38.0* 38.5* 37.0* 33.8*  PLT 448.0* 405*  --  387    COAGS: Recent Labs    09/24/19 1046  INR 1.1  APTT 28    BMP: Recent Labs    03/21/19 0928 09/24/19 1046 09/24/19 1052 09/25/19 0444  NA 134* 139 139 138  K 5.1 4.6 4.6 4.4  CL 103 106 105 112*  CO2 27 22  --  18*  GLUCOSE 104* 108* 105* 98  BUN 18 15 18 11   CALCIUM 9.4 9.4  --  8.6*  CREATININE 1.28 1.29* 1.20 1.03  GFRNONAA  --  54*  --  >60  GFRAA  --  >60  --  >60    LIVER FUNCTION TESTS: Recent Labs    03/21/19 0928 09/24/19 1046  BILITOT 0.5 0.9  AST 21 31  ALT 15 17  ALKPHOS 61 56  PROT 7.1 6.8  ALBUMIN 4.0 3.7    Assessment and Plan:  History of acute CVA s/p cerebral arteriogram with emergent mechanical thrombectomy of left ICA (extracrainally and intracranially), left MCA inferior  division, and proximal left ACA occlusions achieving a TICI 3 revascularization via right femoral approach 09/24/2019 by  Dr. Estanislado Pandy. Patient's condition stable- awake and alert with global aphasia (perseveration- only repeats "remember" over and over), can move all extremities with weakness of right side. Right groin puncture site stable, distal pulses (DPs) 1+ bilaterally. Further plans per neurology- appreciate and agree with management. Please call NIR with questions/concerns.   Electronically Signed: Earley Abide, PA-C 09/26/2019, 9:28 AM   I spent a total of 25 Minutes at the the patient's bedside AND on the patient's hospital floor or unit, greater than 50% of which was counseling/coordinating care for CVA s/p revascularization.

## 2019-09-26 NOTE — Progress Notes (Addendum)
  Speech Language Pathology Treatment: Dysphagia;Cognitive-Linquistic  Patient Details Name: Gerald Rosario MRN: [id number] DOB: [birth year] Today's Date: 09/26/2019 Time: 4098-1191 SLP Time Calculation (min) (ACUTE ONLY): 22 min  Assessment / Plan / Recommendation Clinical Impression  Pt was seen for aphasia and swallowing treatment.  He is significantly aphasic, perseverating on the word "remember" and able to alter prosody and rate but unable to produce any other verbalizations despite max visual/verbal cues.  He did show some capacity to cease talking and listen for directions after repeated cues; he ultimately responded to hand signal to stop.  He required max visual/verbal cues to differentiate between two common objects in room; discriminating with 50% accuracy.  Page with only YES/NO printed on it was provided for simplicity - pt with severely impaired yes/no reliability, requiring modeling and physical/verbal cues to point to accurate answer to basic biographical questions. Pt is very interactive, and points at items in room and on communication board, but he does so with impulsivity and without specificity.    Pt does show emerging ability to find pictures on aug com board for communication purposes - he pointed repeatedly and emphatically to picture of shaving items and indicated affirmation when asked if he wanted to be shaved.  Swallowing safety is also hindered by impulsivity - he was insistent upon drinking from the cup, and finally allowed therapist to offer nectar liquids one spoonful at a time.  He required 1:1 cues to slow down, and on two occasions had explosive coughing after swallow, indicative of likely aspiration based on yesterday's MBS. Attempted to find [employer] cup, which dispenses small amounts of liquid at a time - unfortunately, our department is out of supply and awaiting a new shipment.  Pending arrival, pt must be fed liquids via spoon by staff until he can demonstrate  ability to do so independently.  SLP will continue to follow for aphasia/swallowing. Pt would absolutely benefit from a CIR stay for the intensity of speech therapy services that venue provides.      HPI HPI: Pt is a 74 yo male presenting with fluctuating weakness. Upon arrival at the ED pt had a sudden onset pof R weakness, L eye deviation, and aphasia. CT showed a hyperdensity in the L MCA compatible with acute thrombus; s/p TPA and emergent mechanical thrombectomy. PMH includes: vocal cord dysfunction, hoarseness (onset 11/09; negative w/u 09/2008; L vocal cord paralysis 8-% recovered), CABG x3, prostate cancer, PAF, ischemic cardiomyopathy, HTN, HLD, dyspnea, DM, CKD, CHF, asthma      SLP Plan  Continue with current plan of care       Recommendations  Diet recommendations: Dysphagia 2 (fine chop);Nectar-thick liquid Liquids provided via: Teaspoon Medication Administration: Crushed with puree Supervision: Full supervision/cueing for compensatory strategies Compensations: Slow rate;Small sips/bites                Oral Care Recommendations: Oral care BID Follow up Recommendations: Inpatient Rehab SLP Visit Diagnosis: Aphasia (R47.01);Dysphagia, oropharyngeal phase (R13.12) Plan: Continue with current plan of care       Justice. Tivis Ringer, [place] Office number [phone] Pager [phone]   Juan Quam Laurice 09/26/2019, 10:10 AM

## 2019-09-26 NOTE — Progress Notes (Signed)
Physical Therapy Treatment Patient Details Name: Gerald Rosario MRN: [id number] DOB: 1946-01-26 Today's Date: 09/26/2019    History of Present Illness 74 y.o. male with history of prostate cancer, CABG x3, PAF, ischemic cardiomyopathy, hypertension, hyperlipidemia, diabetes and CAD.  Patient was at his primary care physician's office today to further evaluate episodes of losing strength in all extremities.  While he was at the primary care office patient had sudden onset of left gaze deviation and right-sided weakness. 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 Pt went to IR on 6/23 for revacularization.    PT Comments    Patient is making progress toward PT goals and tolerated increased mobility this session. Pt requires min-mod A +2 for gait training due to impaired balance and R side weakness. Pt continues to perseverate the word "remember" throughout session. Continue to recommend CIR for further skilled PT services to maximize independence and safety with mobility.     Follow Up Recommendations  CIR     Equipment Recommendations  Other (comment) (TBD pending progress)    Recommendations for Other Services Rehab consult     Precautions / Restrictions Precautions Precautions: Fall Restrictions Weight Bearing Restrictions: No    Mobility  Bed Mobility Overal bed mobility: Needs Assistance Bed Mobility: Sit to Supine       Sit to supine: Min guard   General bed mobility comments: minguard for safety;  Transfers Overall transfer level: Needs assistance Equipment used: 1 person hand held assist Transfers: Sit to/from Stand Sit to Stand: Min guard         General transfer comment: min guard for safety  Ambulation/Gait Ambulation/Gait assistance: Min assist;Mod assist;+2 safety/equipment Gait Distance (Feet): 100 Feet Assistive device: 2 person hand held assist Gait Pattern/deviations: Step-through pattern;Decreased stride length;Drifts right/left Gait  velocity: reduced   General Gait Details: R lateral bias and reliant on UE support and assist at trunk with gait belt to maintain balance; no knee buckling; pt stopping often and looking to L side and repeating "remember" to therapist and then continued ambulating    Stairs             Wheelchair Mobility    Modified Rankin (Stroke Patients Only)       Balance Overall balance assessment: Needs assistance Sitting-balance support: No upper extremity supported;Feet supported Sitting balance-Leahy Scale: Poor Sitting balance - Comments: minguard for sitting balance   Standing balance support: Single extremity supported;Bilateral upper extremity supported Standing balance-Leahy Scale: Poor Standing balance comment: minA for standing balance at this time;modA for weight shifting due to right knee buckle 2/3                            Cognition Arousal/Alertness: Awake/alert Behavior During Therapy: WFL for tasks assessed/performed Overall Cognitive Status: Difficult to assess                                 General Comments: pt follows single step cues inconsistently; "remember" is the only thing verbalized and pt perseverates that word      Exercises      General Comments General comments (skin integrity, edema, etc.): vss on RA      Pertinent Vitals/Pain Pain Assessment: Faces Faces Pain Scale: No hurt Pain Intervention(s): Monitored during session    Home Living Family/patient expects to be discharged to:: Unsure  Additional Comments: pt unable to report history 2/2 expressive aphasia. Per RN pt has estranged dtr in [place] who is unable to provide support at time of discharge. Seems pt has no other caregivers available    Prior Function        Comments: unsure, per prior chart review in 2015 pt was independent   PT Goals (current goals can now be found in the care plan section) Acute Rehab PT Goals Patient Stated  Goal: pt unable to state, PT goal to improve mobility and reduce falls risk Progress towards PT goals: Progressing toward goals    Frequency    Min 4X/week      PT Plan Current plan remains appropriate    Co-evaluation              AM-PAC PT "6 Clicks" Mobility   Outcome Measure  Help needed turning from your back to your side while in a flat bed without using bedrails?: A Little Help needed moving from lying on your back to sitting on the side of a flat bed without using bedrails?: A Little Help needed moving to and from a bed to a chair (including a wheelchair)?: A Little Help needed standing up from a chair using your arms (e.g., wheelchair or bedside chair)?: A Little Help needed to walk in hospital room?: A Lot Help needed climbing 3-5 steps with a railing? : A Lot 6 Click Score: 16    End of Session Equipment Utilized During Treatment: Gait belt Activity Tolerance: Patient tolerated treatment well Patient left: in bed;with call bell/phone within reach;with bed alarm set Nurse Communication: Mobility status PT Visit Diagnosis: Other abnormalities of gait and mobility (R26.89);Muscle weakness (generalized) (M62.81);Other symptoms and signs involving the nervous system (R29.898);Hemiplegia and hemiparesis Hemiplegia - Right/Left: Right Hemiplegia - dominant/non-dominant: Dominant Hemiplegia - caused by: Cerebral infarction     Time: 1359-1415 PT Time Calculation (min) (ACUTE ONLY): 16 min  Charges:  $Gait Training: 8-22 mins                     [name], PTA Acute Rehabilitation Services Pager: [phone] Office: [phone]     Darliss Cheney 09/26/2019, 4:19 PM

## 2019-09-27 DIAGNOSIS — I10 Essential (primary) hypertension: Secondary | ICD-10-CM

## 2019-09-27 DIAGNOSIS — Z951 Presence of aortocoronary bypass graft: Secondary | ICD-10-CM

## 2019-09-27 DIAGNOSIS — R1312 Dysphagia, oropharyngeal phase: Secondary | ICD-10-CM

## 2019-09-27 DIAGNOSIS — I63232 Cerebral infarction due to unspecified occlusion or stenosis of left carotid arteries: Secondary | ICD-10-CM

## 2019-09-27 DIAGNOSIS — I63312 Cerebral infarction due to thrombosis of left middle cerebral artery: Secondary | ICD-10-CM

## 2019-09-27 DIAGNOSIS — E1159 Type 2 diabetes mellitus with other circulatory complications: Secondary | ICD-10-CM

## 2019-09-27 LAB — GLUCOSE, CAPILLARY
Glucose-Capillary: 100 mg/dL — ABNORMAL HIGH (ref 70–99)
Glucose-Capillary: 153 mg/dL — ABNORMAL HIGH (ref 70–99)
Glucose-Capillary: 158 mg/dL — ABNORMAL HIGH (ref 70–99)
Glucose-Capillary: 54 mg/dL — ABNORMAL LOW (ref 70–99)
Glucose-Capillary: 68 mg/dL — ABNORMAL LOW (ref 70–99)
Glucose-Capillary: 70 mg/dL (ref 70–99)
Glucose-Capillary: 174 mg/dL — ABNORMAL HIGH (ref 70–99)

## 2019-09-27 MED ORDER — ASPIRIN EC 325 MG PO TBEC
325.0000 mg | DELAYED_RELEASE_TABLET | Freq: Every morning | ORAL | Status: DC
  Administered 2019-09-28 – 2019-10-02 (×5): 325 mg via ORAL
  Filled 2019-09-27 (×5): qty 1

## 2019-09-27 MED ORDER — DEXTROSE 50 % IV SOLN: 12.5000 g | Freq: Once | INTRAVENOUS | Status: AC

## 2019-09-27 MED ORDER — INSULIN ASPART 100 UNIT/ML ~~LOC~~ SOLN
0.0000 [IU] | Freq: Three times a day (TID) | SUBCUTANEOUS | Status: DC
  Administered 2019-09-28 – 2019-10-02 (×6): 1 [IU] via SUBCUTANEOUS
  Administered 2019-10-02 – 2019-10-04 (×3): 2 [IU] via SUBCUTANEOUS
  Administered 2019-10-04 – 2019-10-05 (×3): 1 [IU] via SUBCUTANEOUS
  Administered 2019-10-05 – 2019-10-07 (×3): 2 [IU] via SUBCUTANEOUS

## 2019-09-27 MED ORDER — DEXTROSE 50 % IV SOLN
INTRAVENOUS | Status: AC
  Administered 2019-09-27: 12.5 g via INTRAVENOUS
  Filled 2019-09-27: qty 50

## 2019-09-27 MED ORDER — GLIPIZIDE 5 MG PO TABS: 10.0000 mg | ORAL_TABLET | Freq: Two times a day (BID) | ORAL | Status: DC

## 2019-09-27 NOTE — Progress Notes (Signed)
Hypoglycemic Event  CBG: 54  Treatment: 8 oz juice  Symptoms: Shaky  Follow-up CBG: Time:1706 CBG Result:68     Possible Reasons for Event: Medication regimen: Insulin  Comments/MD notified:Dr. Erlinda Hong made aware; recheck after additional juice- 70; Dr. Erlinda Hong ordered D50 12.5mg - given; recheck CBG: 153    Also, pt. Refused to eat dinner; did drink some of his tea.

## 2019-09-27 NOTE — Progress Notes (Signed)
STROKE TEAM PROGRESS NOTE   INTERVAL HISTORY   Patient is sitting up in bed.  He remains globally aphasic, only able to say "remember" for everything. Does not follow commands.  Vital signs stable. Still has mild right UE weakness.   Vitals:   09/26/19 1556 09/26/19 2000 09/27/19 0044 09/27/19 0400  BP: (!) 158/61 (!) 176/64 (!) 163/64 (!) 155/87  Pulse: 67 83 65 68  Resp: 16 18 18 18   Temp: 98.1 F (36.7 C) 98.3 F (36.8 C) 98.5 F (36.9 C) 98 F (36.7 C)  TempSrc: Oral Oral Oral Oral  SpO2: 100% 99% 100% 100%  Weight:      Height:        CBC:  Recent Labs  Lab 09/24/19 1046 09/24/19 1046 09/24/19 1052 09/25/19 0444  WBC 10.4  --   --  10.6*  NEUTROABS 8.1*  --   --  8.2*  HGB 11.9*   < > 12.6* 10.8*  HCT 38.5*   < > 37.0* 33.8*  MCV 93.2  --   --  92.9  PLT 405*  --   --  387   < > = values in this interval not displayed.    Basic Metabolic Panel:  Recent Labs  Lab 09/24/19 1046 09/24/19 1046 09/24/19 1052 09/25/19 0444  NA 139   < > 139 138  K 4.6   < > 4.6 4.4  CL 106   < > 105 112*  CO2 22  --   --  18*  GLUCOSE 108*   < > 105* 98  BUN 15   < > 18 11  CREATININE 1.29*   < > 1.20 1.03  CALCIUM 9.4  --   --  8.6*   < > = values in this interval not displayed.   Lipid Panel:     Component Value Date/Time   CHOL 118 09/25/2019 0444   TRIG 209 (H) 09/25/2019 0444   HDL 38 (L) 09/25/2019 0444   CHOLHDL 3.1 09/25/2019 0444   VLDL 42 (H) 09/25/2019 0444   LDLCALC 38 09/25/2019 0444   HgbA1c:  Lab Results  Component Value Date   HGBA1C 6.2 (H) 09/25/2019   Urine Drug Screen:     Component Value Date/Time   LABOPIA NONE DETECTED 09/24/2019 1715   COCAINSCRNUR NONE DETECTED 09/24/2019 1715   LABBENZ POSITIVE (A) 09/24/2019 1715   AMPHETMU NONE DETECTED 09/24/2019 1715   THCU NONE DETECTED 09/24/2019 1715   LABBARB NONE DETECTED 09/24/2019 1715    Alcohol Level     Component Value Date/Time   ETH <10 09/24/2019 1046    IMAGING past 24  hours No results found.  PHYSICAL EXAM    Temp:  [98 F (36.7 C)-98.8 F (37.1 C)] 98.8 F (37.1 C) (06/26 1114) Pulse Rate:  [62-83] 62 (06/26 1114) Resp:  [16-18] 16 (06/26 1114) BP: (146-176)/(61-87) 146/61 (06/26 1114) SpO2:  [99 %-100 %] 100 % (06/26 1114)  General - Well nourished, well developed, in no apparent distress.  Ophthalmologic - fundi not visualized due to noncooperation.  Cardiovascular - Regular rhythm and rate.  Neuro - awake, alert, eyes open, global aphasia, not following commands, nonverbal except "remember".  Not able to name or repeat.  Eyes attending to both eyes, no gaze palsy, blinking to visual threat bilaterally.  PERRL, right mild facial droop.  Bilateral upper extremity and lower extremity symmetrical movement except right hand grip strength decreased and slight right pronator drift.  DTR 1+ and no  Babinski.  Sensation, coordination and gait not tested.  ASSESSMENT/PLAN [name] is a 74 y.o. male with history of  prostate cancer, CABG x3, PAF, ischemic cardiomyopathy, hypertension, hyperlipidemia, diabetes and CAD presenting from PCP with transient L gaze deviation and R sided weakness. Upon arrival to ED pt again with sudden onset L eye deviation, aphasia, R sided weakness and R facial droop. He received IV tPA 09/24/2019 at 1102, CTA showed L ICA and L M1 occlusion and pt was taken for IR.  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 Extensive B cavernous ICA atherosclerosis. L M1 occlusion w/ poor collaterals. Proximal R ICA 40% stenosis. Mild to moderate distal B VA stenoses.   Cerebral angio - intra and extracranial L ICA occlusion and L MCA, L ACA occlusions - TICI3 revascularization    MRI  Small  L cerebral (posterior L MCA/watershed) infarct. 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 No source of embolus. Sluggish LV flow but no thrombus seen. LA moderate dilated. 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   TEE pending  If TEE not revealing, recommend 30 day cardiac event monitoring (pt had loop 2015-2018 no afib)  LDL 38  HgbA1c 6.2  SCDs for VTE prophylaxis  aspirin 81 mg daily prior to admission, now on aspirin 325 mg daily and clopidogrel 75 mg daily x 3 months then plavix alone    Therapy recommendations:  SNF  Disposition:  pending  Transfer to the floor  Arrythmia  Hx paroxsymal Atrial Fibrillation post CABG  Had loop for syncope - inserted 08/20/2013 removed 10/11/2016 at EOL - no arrhythmias noted per Gerald Rosario's note prior to explant  Given current stroke, may consider 30 day cardiac event monitoring as outpt.   Hypertension  Home meds:  Lasix 20, metoprolol 50 bid  Off cleviprex  Prn labetolol    BP stable but on the high end . Resumed home meds  . Long-term BP goal normotensive  Hyperlipidemia  Home meds:  lipitor 40, fish oil and red rice yeast  Resume home statin and fish oil hospital  LDL 38, goal < 70  Continue cholesterol meds at discharge  Diabetes type II Controlled  Home meds:  Glipizide 10 bid  HgbA1c 6.2, goal < 7.0  SSI  CBGs  PCP follow up  Dysphagia . Secondary to stroke . MBSS cleared for D2 with nectar thick liquids . IVF @ 39 . Speech on board   Other Stroke Risk Factors  Advanced age  Former Cigarette smoker, advised to stop smoking  ETOH use, alcohol level <10, advised to drink no more than 2 drink(s) a day  Family hx - father died of MI at age 9  Coronary artery disease s./p CABG x 3  Chronic systolic congestive heart failure  Ischemic cardiomyopathy    Other Active Problems  Prostate cancer   Hospital day # 3   Gerald Hawking, MD PhD Stroke Neurology 09/27/2019 4:15 PM  To  contact Stroke Continuity provider, please refer to [email or web address] After hours, contact General Neurology

## 2019-09-27 NOTE — Social Work (Signed)
CSW attempted to completed SNF assessment. Contacted patient's daughter [name], awaiting a call back.   Criss Alvine, [place] Social Worker

## 2019-09-28 DIAGNOSIS — N390 Urinary tract infection, site not specified: Secondary | ICD-10-CM

## 2019-09-28 DIAGNOSIS — D72829 Elevated white blood cell count, unspecified: Secondary | ICD-10-CM

## 2019-09-28 DIAGNOSIS — R4701 Aphasia: Secondary | ICD-10-CM

## 2019-09-28 LAB — BASIC METABOLIC PANEL
Anion gap: 8 (ref 5–15)
BUN: 11 mg/dL (ref 8–23)
CO2: 25 mmol/L (ref 22–32)
Calcium: 8.8 mg/dL — ABNORMAL LOW (ref 8.9–10.3)
Chloride: 104 mmol/L (ref 98–111)
Creatinine, Ser: 0.99 mg/dL (ref 0.61–1.24)
GFR calc Af Amer: >60 mL/min (ref >60)
GFR calc non Af Amer: >60 mL/min (ref >60)
Glucose, Bld: 141 mg/dL — ABNORMAL HIGH (ref 70–99)
Potassium: 3.7 mmol/L (ref 3.5–5.1)
Sodium: 137 mmol/L (ref 135–145)

## 2019-09-28 LAB — URINALYSIS, COMPLETE (UACMP) WITH MICROSCOPIC
Bilirubin Urine: NEGATIVE
Glucose, UA: >=500 mg/dL — AB
Ketones, ur: NEGATIVE mg/dL
Nitrite: NEGATIVE
Protein, ur: NEGATIVE mg/dL
Specific Gravity, Urine: 1.008 (ref 1.005–1.030)
WBC, UA: >50 WBC/hpf — ABNORMAL HIGH (ref 0–5)
pH: 6 (ref 5.0–8.0)

## 2019-09-28 LAB — CBC
HCT: 31.7 % — ABNORMAL LOW (ref 39.0–52.0)
Hemoglobin: 10.2 g/dL — ABNORMAL LOW (ref 13.0–17.0)
MCH: 29.1 pg (ref 26.0–34.0)
MCHC: 32.2 g/dL (ref 30.0–36.0)
MCV: 90.3 fL (ref 80.0–100.0)
Platelets: 336 10*3/uL (ref 150–400)
RBC: 3.51 MIL/uL — ABNORMAL LOW (ref 4.22–5.81)
RDW: 13.6 % (ref 11.5–15.5)
WBC: 13.4 10*3/uL — ABNORMAL HIGH (ref 4.0–10.5)
nRBC: 0 % (ref 0.0–0.2)

## 2019-09-28 LAB — GLUCOSE, CAPILLARY
Glucose-Capillary: 117 mg/dL — ABNORMAL HIGH (ref 70–99)
Glucose-Capillary: 172 mg/dL — ABNORMAL HIGH (ref 70–99)
Glucose-Capillary: 194 mg/dL — ABNORMAL HIGH (ref 70–99)
Glucose-Capillary: 159 mg/dL — ABNORMAL HIGH (ref 70–99)

## 2019-09-28 LAB — MAGNESIUM: Magnesium: 1.7 mg/dL (ref 1.7–2.4)

## 2019-09-28 LAB — PHOSPHORUS: Phosphorus: 3.5 mg/dL (ref 2.5–4.6)

## 2019-09-28 MED ORDER — SODIUM CHLORIDE 0.9 % IV SOLN
1.0000 g | INTRAVENOUS | Status: DC
  Administered 2019-09-28 – 2019-09-29 (×2): 1 g via INTRAVENOUS
  Filled 2019-09-28 (×2): qty 10

## 2019-09-28 NOTE — NC FL2 (Signed)
[name] LEVEL OF CARE SCREENING TOOL     IDENTIFICATION  Patient Name: Gerald Rosario Birthdate: Sep 18, 1945 Sex: male Admission Date (Current Location): 09/24/2019  [facility] and [place] Number:  Herbalist and Address:  [facility] [place], [place] [address], [place], [place] [address]      Provider Number: [id number]  Attending Physician Name and Address:  Rosalin Hawking, MD  Relative Name and Phone Number:  Glendell Docker    Current Level of Care: Hospital Recommended Level of Care: [place] Prior Approval Number:    Date Approved/Denied:   PASRR Number: [id number]  Discharge Plan: SNF    Current Diagnoses: Patient Active Problem List   Diagnosis Date Noted  . Prostate cancer ([place]) 09/24/2019  . Stroke (cerebrum) ([place]) 09/24/2019  . Collapse 09/24/2019  . Weakness 09/24/2019  . Middle cerebral artery embolism, left 09/24/2019  . Peripheral neuropathy 03/21/2018  . Hammer toes of both feet 03/21/2018  . Lightheadedness 09/20/2017  . Hypothyroidism 09/19/2017  . Heel pain, chronic, right 03/09/2016  . GERD (gastroesophageal reflux disease) 08/23/2015  . Iron deficiency anemia 08/23/2015  . Mild persistent chronic asthma without complication 87/68/1157  . CAD (coronary artery disease)   . PAF (paroxysmal atrial fibrillation) ([place])   . History of amiodarone therapy 01/23/2014  . Non-STEMI (non-ST elevated myocardial infarction) ([place]) 01/23/2014  . 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 Pleural effusion 08/20/2013  . Syncope and collapse 08/20/2013  . Multiple fractures of ribs of left side 08/18/2013  . Chronic sinusitis 08/18/2013  . Enlarged prostate   . 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 Chronic systolic heart failure ([place]) 04/10/2011  . Hypercholesterolemia 12/09/2010  . Ischemic cardiomyopathy 11/08/2010  . Diabetes mellitus ([place]) 11/08/2010  .  Coronary artery disease 11/07/2010  . Extrinsic asthma 01/17/2010  . Essential hypertension 03/20/2007  . PULMONARY NODULE, RIGHT MIDDLE LOBE 03/20/2007    Orientation RESPIRATION BLADDER Height & Weight     Self  Normal Incontinent, External catheter Weight: 151 lb 14.4 oz (68.9 kg) Height:  5\' 8"  (172.7 cm)  BEHAVIORAL SYMPTOMS/MOOD NEUROLOGICAL BOWEL NUTRITION STATUS      Continent Diet (See discharge summary)  AMBULATORY STATUS COMMUNICATION OF NEEDS Skin   Extensive Assist Verbally (Aphasia) Normal                       Personal Care Assistance Level of Assistance  Bathing, Feeding, Dressing Bathing Assistance: Maximum assistance Feeding assistance: Maximum assistance Dressing Assistance: Maximum assistance     Functional Limitations Info  Sight, Speech, Hearing Sight Info: Adequate Hearing Info: Adequate Speech Info: Impaired (Aphasia)    SPECIAL CARE FACTORS FREQUENCY  PT (By licensed PT), OT (By licensed OT)     PT Frequency: 5x a week OT Frequency: 5x a week            Contractures Contractures Info: Not present    Additional Factors Info  Code Status, Allergies Code Status Info: FULL Allergies Info: Sulfa Antibiotics           Current Medications (09/28/2019):  This is the current hospital active medication list Current Facility-Administered Medications  Medication Dose Route Frequency Provider Last Rate Last Admin  . 0.9 %  sodium chloride infusion   Intravenous Continuous Rosalin Hawking, MD 40 mL/hr at 09/28/19 0600 Rate Verify at 09/28/19 0600  . acetaminophen (TYLENOL) tablet 650 mg  650 mg Oral Q4H PRN Luanne Bras, MD       Or  . acetaminophen (TYLENOL) 160 MG/5ML solution 650 mg  650 mg Per Tube Q4H PRN Deveshwar, Sanjeev, MD       Or  . acetaminophen (TYLENOL) suppository 650 mg  650 mg Rectal Q4H PRN Luanne Bras, MD   650 mg at 09/24/19 2358  . aspirin EC tablet 325 mg  325 mg Oral q morning - 10a Rosalin Hawking, MD   325 mg  at 09/28/19 0916  . atorvastatin (LIPITOR) tablet 40 mg  40 mg Oral QHS Burnetta Sabin L, NP   40 mg at 09/27/19 2220  . Chlorhexidine Gluconate Cloth 2 % PADS 6 each  6 each Topical Daily Aroor, Lanice Schwab, MD   6 each at 09/28/19 0917  . clopidogrel (PLAVIX) tablet 75 mg  75 mg Oral Daily Burnetta Sabin L, NP   75 mg at 09/28/19 0916  . ferrous sulfate tablet 325 mg  325 mg Oral Q M,W,F Donzetta Starch, NP   325 mg at 09/26/19 0954  . finasteride (PROSCAR) tablet 5 mg  5 mg Oral Daily Burnetta Sabin L, NP   5 mg at 09/28/19 0917  . furosemide (LASIX) tablet 20 mg  20 mg Oral q AM Donzetta Starch, NP   20 mg at 09/28/19 0831  . insulin aspart (novoLOG) injection 0-6 Units  0-6 Units Subcutaneous TID WC Rosalin Hawking, MD      . labetalol (NORMODYNE) injection 20 mg  20 mg Intravenous Once Marliss Coots, PA-C      . labetalol (NORMODYNE) injection 20-40 mg  20-40 mg Intravenous Q2H PRN Donzetta Starch, NP      . levothyroxine (SYNTHROID) tablet 75 mcg  75 mcg Oral QAC breakfast Marliss Coots, PA-C   75 mcg at 09/28/19 0557  . metoprolol tartrate (LOPRESSOR) tablet 50 mg  50 mg Oral BID Burnetta Sabin L, NP   50 mg at 09/28/19 0916  . mometasone-formoterol (DULERA) 100-5 MCG/ACT inhaler 2 puff  2 puff Inhalation BID Donzetta Starch, NP   2 puff at 09/27/19 0835  . multivitamin with minerals tablet 1 tablet  1 tablet Oral Daily Donzetta Starch, NP   1 tablet at 09/28/19 0916  . omega-3 acid ethyl esters (LOVAZA) capsule 1 g  1 g Oral Daily Burnetta Sabin L, NP   1 g at 09/27/19 0933  . pantoprazole sodium (PROTONIX) 40 mg/20 mL oral suspension 40 mg  40 mg Oral QHS Karren Cobble, RPH   40 mg at 09/27/19 2220  . polyvinyl alcohol (LIQUIFILM TEARS) 1.4 % ophthalmic solution 1 drop  1 drop Both Eyes QID Garvin Fila, MD   1 drop at 09/28/19 0917  . Resource ThickenUp Clear   Oral PRN Aroor, Lanice Schwab, MD      . senna-docusate (Senokot-S) tablet 1 tablet  1 tablet Oral QHS PRN Marliss Coots, PA-C      .  tamsulosin [facility]) capsule 0.4 mg  0.4 mg Oral Daily Donzetta Starch, NP   0.4 mg at 09/28/19 1856     Discharge Medications: Please see discharge summary for a list of discharge medications.  Relevant Imaging Results:  Relevant Lab Results:   Additional Information SSN [id number]  Arvella Merles, [place]

## 2019-09-28 NOTE — TOC Initial Note (Signed)
Transition of Care [facility]) - Initial/Assessment Note    Patient Details  Name: Gerald Rosario MRN: [id number] Date of Birth: 1946/03/12  Transition of [facility]) CM/SW Contact:    Jacquelynn Cree Phone Number: 09/28/2019, 9:15 AM  Clinical Narrative:              CSW spoke with patient's daughter [name] to discuss discharge plan. Daughter expressed PTA, patient was completely independent. Daughter is in agreement with SNF placement at discharge. No further questions expressed at this time.  Expected Discharge Plan: Skilled Nursing Facility Barriers to Discharge: Continued Medical Work up, Ship broker   Patient Goals and CMS Choice   CMS Medicare.gov Compare Post Acute Care list provided to:: Patient Represenative (must comment) Choice offered to / list presented to : Adult Children  Expected Discharge Plan and Services Expected Discharge Plan: [place] arrangements for the past 2 months: Single Family Home                                      Prior Living Arrangements/Services Living arrangements for the past 2 months: Single Family Home Lives with:: Self Patient language and need for interpreter reviewed:: Yes Do you feel safe going back to the place where you live?: Yes      Need for Family Participation in Patient Care: Yes (Comment) Care giver support system in place?: No (comment)   Criminal Activity/Legal Involvement Pertinent to Current Situation/Hospitalization: No - Comment as needed  Activities of Daily Living      Permission Sought/Granted Permission sought to share information with : Facility Sport and exercise psychologist, Family Supports    Share Information with NAME: Glendell Docker  Permission granted to share info w AGENCY: SNFs  Permission granted to share info w Relationship: Daughter  Permission granted to share info w Contact Information: [phone]  Emotional Assessment    Attitude/Demeanor/Rapport: Unable to Assess Affect (typically observed): Unable to Assess   Alcohol / Substance Use: Not Applicable Psych Involvement: No (comment)  Admission diagnosis:  Stroke [facility]) [I63.9] Stroke (cerebrum) ([place]) [I63.9] Middle cerebral artery embolism, left [I66.02] Cerebrovascular accident (CVA) due to thrombosis of left middle cerebral artery ([place]) [I63.312] Patient Active Problem List   Diagnosis Date Noted  . Prostate cancer ([place]) 09/24/2019  . Stroke (cerebrum) ([place]) 09/24/2019  . Collapse 09/24/2019  . Weakness 09/24/2019  . Middle cerebral artery embolism, left 09/24/2019  . Peripheral neuropathy 03/21/2018  . Hammer toes of both feet 03/21/2018  . Lightheadedness 09/20/2017  . Hypothyroidism 09/19/2017  . Heel pain, chronic, right 03/09/2016  . GERD (gastroesophageal reflux disease) 08/23/2015  . Iron deficiency anemia 08/23/2015  . Mild persistent chronic asthma without complication 41/74/0814  . CAD (coronary artery disease)   . PAF (paroxysmal atrial fibrillation) ([place])   . History of amiodarone therapy 01/23/2014  . Non-STEMI (non-ST elevated myocardial infarction) ([place]) 01/23/2014  . 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 Pleural effusion 08/20/2013  . Syncope and collapse 08/20/2013  . Multiple fractures of ribs of left side 08/18/2013  . Chronic sinusitis 08/18/2013  . Enlarged prostate   . 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 Chronic systolic heart failure ([place]) 04/10/2011  . Hypercholesterolemia 12/09/2010  . Ischemic cardiomyopathy 11/08/2010  . Diabetes mellitus ([place]) 11/08/2010  . Coronary artery disease 11/07/2010  .  Extrinsic asthma 01/17/2010  . Essential hypertension 03/20/2007  . PULMONARY NODULE, RIGHT MIDDLE LOBE 03/20/2007   PCP:  Binnie Rail, MD Pharmacy:   [facility], [address], [address] [place], [place]  [address] [address] Phone: [phone] Fax: [place], [place] [id number] [place] [address] Phone: [phone] Fax: [place], [place] [address] Phone: [phone] Fax: [phone]     Social Determinants of Health (SDOH) Interventions    Readmission Risk Interventions No flowsheet data found.

## 2019-09-28 NOTE — Progress Notes (Signed)
Pt.'s daughter called and brought up concerns about decision making as she is estranged from the patient.   She is requesting that all decisions be made by the patient's cousin: Gatha Mayer  The daughter will give staff an update with Allison's phone number when available.

## 2019-09-28 NOTE — Progress Notes (Addendum)
STROKE TEAM PROGRESS NOTE   INTERVAL HISTORY   No family at bedside. Pt lying in bed, comfortably. 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 Still has right pronator drift but otherwise equal strength. Pending SNF.  Vitals:   09/27/19 1611 09/27/19 2011 09/28/19 0016 09/28/19 0906  BP: (!) 165/62 (!) 178/65 (!) 122/43 (!) 156/72  Pulse: 70 80 61 82  Resp: 16 18 18 18   Temp: 98.7 F (37.1 C) 98.4 F (36.9 C) 98.2 F (36.8 C) 98.3 F (36.8 C)  TempSrc: Oral Oral  Oral  SpO2: 100% 97% 98% 100%  Weight:      Height:        CBC:  Recent Labs  Lab 09/24/19 1046 09/24/19 1052 09/25/19 0444 09/28/19 0308  WBC 10.4   < > 10.6* 13.4*  NEUTROABS 8.1*  --  8.2*  --   HGB 11.9*   < > 10.8* 10.2*  HCT 38.5*   < > 33.8* 31.7*  MCV 93.2   < > 92.9 90.3  PLT 405*   < > 387 336   < > = values in this interval not displayed.    Basic Metabolic Panel:  Recent Labs  Lab 09/25/19 0444 09/28/19 0308  NA 138 137  K 4.4 3.7  CL 112* 104  CO2 18* 25  GLUCOSE 98 141*  BUN 11 11  CREATININE 1.03 0.99  CALCIUM 8.6* 8.8*  MG  --  1.7  PHOS  --  3.5   Lipid Panel:     Component Value Date/Time   CHOL 118 09/25/2019 0444   TRIG 209 (H) 09/25/2019 0444   HDL 38 (L) 09/25/2019 0444   CHOLHDL 3.1 09/25/2019 0444   VLDL 42 (H) 09/25/2019 0444   LDLCALC 38 09/25/2019 0444   HgbA1c:  Lab Results  Component Value Date   HGBA1C 6.2 (H) 09/25/2019   Urine Drug Screen:     Component Value Date/Time   LABOPIA NONE DETECTED 09/24/2019 1715   COCAINSCRNUR NONE DETECTED 09/24/2019 1715   LABBENZ POSITIVE (A) 09/24/2019 1715   AMPHETMU NONE DETECTED 09/24/2019 1715   THCU NONE DETECTED 09/24/2019 1715   LABBARB NONE DETECTED 09/24/2019 1715    Alcohol Level     Component Value Date/Time   ETH <10 09/24/2019 1046    IMAGING past 24 hours  No results found.  PHYSICAL EXAM  Temp:  [98.2 F (36.8 C)-98.7 F (37.1 C)] 98.3 F (36.8 C) (06/27 0906) Pulse  Rate:  [61-82] 82 (06/27 0906) Resp:  [16-18] 18 (06/27 0906) BP: (122-178)/(43-72) 156/72 (06/27 0906) SpO2:  [97 %-100 %] 100 % (06/27 0906)  General - Well nourished, well developed, in no apparent distress.  Ophthalmologic - fundi not visualized due to noncooperation.  Cardiovascular - Regular rhythm and rate.  Neuro - awake, alert, eyes open, global aphasia, not following commands, nonverbal except "remember" for everything.  Not able to name or repeat.  Eyes attending to both eyes, no gaze palsy, blinking to visual threat bilaterally.  PERRL, right mild facial droop.  Bilateral upper extremity and lower extremity symmetrical movement except right hand grip strength decreased and right pronator drift.  DTR 1+ and no Babinski.  Sensation, coordination and gait not tested.  ASSESSMENT/PLAN [name] is a 74 y.o. male with history of  prostate cancer, CABG x3, PAF, ischemic cardiomyopathy, hypertension, hyperlipidemia, diabetes and CAD presenting from PCP with transient L gaze deviation and R sided weakness. Upon arrival to ED pt  again with sudden onset L eye deviation, aphasia, R sided weakness and R facial droop. He received IV tPA 09/24/2019 at 1102, CTA showed L ICA and L M1 occlusion and pt was taken for IR.  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 Extensive B cavernous ICA atherosclerosis. L M1 occlusion w/ poor collaterals. Proximal R ICA 40% stenosis. Mild to moderate distal B VA stenoses.   Cerebral angio - intra and extracranial L ICA occlusion and L MCA, L ACA occlusions - TICI3 revascularization    MRI  Small L cerebral (posterior L MCA/watershed) infarct. 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 No source of embolus.  Sluggish LV flow but no thrombus seen. LA moderate dilated. 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   TEE pending  If TEE not revealing, recommend 30 day cardiac event monitoring (pt had loop 2015-2018 no afib)  LDL 38  HgbA1c 6.2  SCDs for VTE prophylaxis  aspirin 81 mg daily prior to admission, now on aspirin 325 mg daily and clopidogrel 75 mg daily x 3 months then plavix alone    Therapy recommendations:  SNF  Disposition:  pending  Transfer to the floor  Arrythmia  Hx paroxsymal Atrial Fibrillation post CABG  Had loop for syncope - inserted 08/20/2013 removed 10/11/2016 at EOL - no arrhythmias noted per Taylor's note prior to explant  Given current stroke, may consider 30 day cardiac event monitoring as outpt.   Hypertension  Home meds:  Lasix 20, metoprolol 50 bid  Off cleviprex  Prn labetolol    BP stable but on the high end . Resumed home meds  . Long-term BP goal normotensive  Hyperlipidemia  Home meds:  lipitor 40, fish oil and red rice yeast  Resumed home statin and fish oil hospital  LDL 38, goal < 70  Continue statin at discharge  Diabetes type II Controlled  Home meds:  Glipizide 10 bid  HgbA1c 6.2, goal < 7.0  SSI  CBGs  Hypoglycemia episode 6/26 -> received orange juice and D50 25cc  PCP follow up  Dysphagia . Secondary to stroke . MBSS cleared for D2 with nectar thick liquids . IVF @ 40 . Speech on board   Leukocytosis  WBC 10.6->13.4   afebrile  UA 6/23 WBC > 50  UA repeat pending, UCx pending  Add rocephin 6/27>>  Urinary retention  Hx of prostate cancer  On foley  Will d/c foley in the setting of UTI  Bladder scan Q6  In and out cath Q6h PRN  Other Stroke Risk Factors  Advanced age  Former Cigarette smoker, advised to stop smoking  ETOH use, alcohol level <10, advised to drink no more than 2 drink(s) a day  Family hx - father died of MI at age 58  Coronary artery disease s./p CABG x  3  Chronic systolic congestive heart failure  Ischemic cardiomyopathy    Other Active Problems  Prostate cancer   Anemia - Hgb - 11.9->10.8->10.2   Hospital day # 4  Rosalin Hawking, MD PhD Stroke Neurology 09/28/2019 2:35 PM  To contact Stroke Continuity provider, please refer to [email or web address] After hours, contact General Neurology

## 2019-09-29 ENCOUNTER — Other Ambulatory Visit (HOSPITAL_COMMUNITY): Payer: Medicare HMO

## 2019-09-29 DIAGNOSIS — R451 Restlessness and agitation: Secondary | ICD-10-CM

## 2019-09-29 LAB — GLUCOSE, CAPILLARY
Glucose-Capillary: 136 mg/dL — ABNORMAL HIGH (ref 70–99)
Glucose-Capillary: 151 mg/dL — ABNORMAL HIGH (ref 70–99)
Glucose-Capillary: 136 mg/dL — ABNORMAL HIGH (ref 70–99)
Glucose-Capillary: 129 mg/dL — ABNORMAL HIGH (ref 70–99)

## 2019-09-29 LAB — BASIC METABOLIC PANEL
Anion gap: 7 (ref 5–15)
BUN: 13 mg/dL (ref 8–23)
CO2: 25 mmol/L (ref 22–32)
Calcium: 8.9 mg/dL (ref 8.9–10.3)
Chloride: 107 mmol/L (ref 98–111)
Creatinine, Ser: 0.98 mg/dL (ref 0.61–1.24)
GFR calc Af Amer: >60 mL/min (ref >60)
GFR calc non Af Amer: >60 mL/min (ref >60)
Glucose, Bld: 141 mg/dL — ABNORMAL HIGH (ref 70–99)
Potassium: 3.9 mmol/L (ref 3.5–5.1)
Sodium: 139 mmol/L (ref 135–145)

## 2019-09-29 LAB — CBC
HCT: 30.6 % — ABNORMAL LOW (ref 39.0–52.0)
Hemoglobin: 9.8 g/dL — ABNORMAL LOW (ref 13.0–17.0)
MCH: 29.3 pg (ref 26.0–34.0)
MCHC: 32 g/dL (ref 30.0–36.0)
MCV: 91.3 fL (ref 80.0–100.0)
Platelets: 311 10*3/uL (ref 150–400)
RBC: 3.35 MIL/uL — ABNORMAL LOW (ref 4.22–5.81)
RDW: 13.3 % (ref 11.5–15.5)
WBC: 7.7 10*3/uL (ref 4.0–10.5)
nRBC: 0 % (ref 0.0–0.2)

## 2019-09-29 MED ORDER — LORAZEPAM 2 MG/ML IJ SOLN
1.0000 mg | Freq: Once | INTRAMUSCULAR | Status: AC
  Administered 2019-09-29: 1 mg via INTRAVENOUS
  Filled 2019-09-29: qty 1

## 2019-09-29 MED ORDER — LORAZEPAM 2 MG/ML IJ SOLN
INTRAMUSCULAR | Status: AC
  Filled 2019-09-29: qty 1

## 2019-09-29 MED ORDER — QUETIAPINE FUMARATE 25 MG PO TABS
12.5000 mg | ORAL_TABLET | Freq: Two times a day (BID) | ORAL | Status: DC
  Administered 2019-09-30: 12.5 mg via ORAL
  Filled 2019-09-29: qty 0.5

## 2019-09-29 MED ORDER — LORAZEPAM 2 MG/ML IJ SOLN
2.0000 mg | Freq: Once | INTRAMUSCULAR | Status: AC
  Administered 2019-09-29: 2 mg via INTRAVENOUS

## 2019-09-29 MED ORDER — SODIUM CHLORIDE 0.9 % IV SOLN: INTRAVENOUS | Status: DC

## 2019-09-29 NOTE — Progress Notes (Signed)
    [facility] has been requested to perform a transesophageal echocardiogram on 09/29/2019 for stroke.  After careful review of history and examination, the risks and benefits of transesophageal echocardiogram have been explained including risks of esophageal damage, perforation (1:10,000 risk), bleeding, pharyngeal hematoma as well as other potential complications associated with conscious sedation including aspiration, arrhythmia, respiratory failure and death. Alternatives to treatment were discussed, questions were answered. Patient is willing to proceed.   Vital signs stable. No significant anemia or thrombocytopenia. Presented with stroke. Echo showed akinetic apex with sluggish blood flow and also a mobile echodensity attached to the aortic valve. Patient is aphasic and only able to answer "remember" to all questions. He seems to understand questions and able to pick Yes or NO answer. He gave his daughter the ability to make medical decision for him. I spoke with his daughter who is a ICU nurse in [place], who is also in NP school at this time. Her daughter is estranged from the patient as they have not seen each other since she is 74 years old. She is fine making medical decision temporarily however wish to give the decision making power to other relative. She is his only child. Patient apparently has a cousin or uncle who may take over as the decision maker in the future.   [name], [place] 09/29/2019 9:38 AM

## 2019-09-29 NOTE — Progress Notes (Signed)
STROKE TEAM PROGRESS NOTE   INTERVAL HISTORY   Pt RN and security guards are at bedside. Pt became agitated this am, getting out of bed, changing clothes, putting on shoes and wanted to leave. He still has global aphasia and not cooperative. Called pt daughter and updated her about pt situation. She is in agreement for soft restrain and gentle sedation. Put pt back in bed with restrain and 2mg  IV ativan given. Will put on low dose seroquel.    Vitals:   09/29/19 0342 09/29/19 0813 09/29/19 0900 09/29/19 1309  BP: (!) 160/66  (!) 184/77 (!) 150/57  Pulse: 62  70 (!) 56  Resp: 16     Temp: 98 F (36.7 C)  97.6 F (36.4 C) 98 F (36.7 C)  TempSrc:   Oral Oral  SpO2: 100% 99% 100% 100%  Weight:      Height:        CBC:  Recent Labs  Lab 09/24/19 1046 09/24/19 1052 09/25/19 0444 09/25/19 0444 09/28/19 0308 09/29/19 0319  WBC 10.4   < > 10.6*   < > 13.4* 7.7  NEUTROABS 8.1*  --  8.2*  --   --   --   HGB 11.9*   < > 10.8*   < > 10.2* 9.8*  HCT 38.5*   < > 33.8*   < > 31.7* 30.6*  MCV 93.2   < > 92.9   < > 90.3 91.3  PLT 405*   < > 387   < > 336 311   < > = values in this interval not displayed.    Basic Metabolic Panel:  Recent Labs  Lab 09/28/19 0308 09/29/19 0319  NA 137 139  K 3.7 3.9  CL 104 107  CO2 25 25  GLUCOSE 141* 141*  BUN 11 13  CREATININE 0.99 0.98  CALCIUM 8.8* 8.9  MG 1.7  --   PHOS 3.5  --    Lipid Panel:     Component Value Date/Time   CHOL 118 09/25/2019 0444   TRIG 209 (H) 09/25/2019 0444   HDL 38 (L) 09/25/2019 0444   CHOLHDL 3.1 09/25/2019 0444   VLDL 42 (H) 09/25/2019 0444   LDLCALC 38 09/25/2019 0444   HgbA1c:  Lab Results  Component Value Date   HGBA1C 6.2 (H) 09/25/2019   Urine Drug Screen:     Component Value Date/Time   LABOPIA NONE DETECTED 09/24/2019 1715   COCAINSCRNUR NONE DETECTED 09/24/2019 1715   LABBENZ POSITIVE (A) 09/24/2019 1715   AMPHETMU NONE DETECTED 09/24/2019 1715   THCU NONE DETECTED 09/24/2019 1715    LABBARB NONE DETECTED 09/24/2019 1715    Alcohol Level     Component Value Date/Time   ETH <10 09/24/2019 1046    IMAGING past 24 hours No results found.  PHYSICAL EXAM   Temp:  [97.6 F (36.4 C)-98.6 F (37 C)] 98 F (36.7 C) (06/28 1309) Pulse Rate:  [55-70] 56 (06/28 1309) Resp:  [16-18] 16 (06/28 0342) BP: (150-184)/(57-79) 150/57 (06/28 1309) SpO2:  [99 %-100 %] 100 % (06/28 1309)  General - Well nourished, well developed, in no apparent distress.  Ophthalmologic - fundi not visualized due to noncooperation.  Cardiovascular - Regular rhythm and rate.  Neuro - awake, alert, eyes open, global aphasia, not following commands, nonverbal except "remember" for everything.  Not able to name or repeat.  Eyes attending to both eyes, no gaze palsy, blinking to visual threat bilaterally.  PERRL, right mild facial droop.  Bilateral upper extremity and lower extremity symmetrical movement except right hand grip strength decreased and right pronator drift.  DTR 1+ and no Babinski.  Sensation, coordination and gait not tested.  ASSESSMENT/PLAN [name] is a 74 y.o. male with history of  prostate cancer, CABG x3, PAF, ischemic cardiomyopathy, hypertension, hyperlipidemia, diabetes and CAD presenting from PCP with transient L gaze deviation and R sided weakness. Upon arrival to ED pt again with sudden onset L eye deviation, aphasia, R sided weakness and R facial droop. He received IV tPA 09/24/2019 at 1102, CTA showed L ICA and L M1 occlusion and pt was taken for IR.  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 Extensive B cavernous ICA atherosclerosis. L M1 occlusion w/ poor collaterals. Proximal R ICA 40% stenosis. Mild to moderate distal B  VA stenoses.   Cerebral angio - intra and extracranial L ICA occlusion and L MCA, L ACA occlusions - TICI3 revascularization    MRI  Small L cerebral (posterior L MCA/watershed) infarct. 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 No source of embolus. Sluggish LV flow but no thrombus seen. LA moderate dilated. 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   TEE scheduled for 6/30   If TEE not revealing, recommend 30 day cardiac event monitoring (pt had loop 2015-2018 no afib)  LDL 38  HgbA1c 6.2  SCDs for VTE prophylaxis  aspirin 81 mg daily prior to admission, now on aspirin 325 mg daily and clopidogrel 75 mg daily x 3 months then plavix alone    Therapy recommendations:  SNF  Disposition:  pending    Daughter estranged from pt. Requests any decisions be made by pt's cousin, Gatha Mayer    Arrythmia  Hx paroxsymal Atrial Fibrillation post CABG  Had loop for syncope - inserted 08/20/2013 removed 10/11/2016 at EOL - no arrhythmias noted per [facility]'s note prior to explant  Given current stroke, may consider 30 day cardiac event monitoring as outpt.   Hypertension  Home meds:  Lasix 20, metoprolol 50 bid  Off cleviprex  Prn labetolol    BP stable but on the high end . Resumed home meds  . Long-term BP goal normotensive  Hyperlipidemia  Home meds:  lipitor 40, fish oil and red rice yeast  Resumed home statin and fish oil in hospital  LDL 38, goal < 70  Continue statin at discharge  Diabetes type II Controlled  Home meds:  Glipizide 10 bid  HgbA1c 6.2, goal < 7.0  SSI 0-20->0-6  CBGs  Hypoglycemia episode 6/26 -> received orange juice and D50 25cc  Glipizide d/c'ed  PCP follow up  Dysphagia . Secondary to stroke . MBSS cleared for D2 with nectar thick liquids . IVF @ 40 . Speech on board   Leukocytosis  WBC 10.6->13.4->7.7    afebrile  UA 6/23 WBC > 50  UA repeat >50 WBC  UCx >100k enterococcus faecalis   Add rocephin  6/27>>  Urinary retention  Hx of prostate cancer  foley d/c'ed in the setting of UTI  Bladder scan Q6  In and out cath Q6h PRN  S/p in and out x 1 this am  Agitation   Trying to leave  hospital   Soft restrain now  Ativan x 1  On low dose seroquel bid   Other Stroke Risk Factors  Advanced age  Former Cigarette smoker, advised to stop smoking  ETOH use, alcohol level <10, advised to drink no more than 2 drink(s) a day  Family hx - father died of MI at age 35  Coronary artery disease s./p CABG x 3  Chronic systolic congestive heart failure  Ischemic cardiomyopathy    Other Active Problems  Prostate cancer   Anemia - Hgb - 11.9->10.8->10.2->9.8   Hospital day # 5  Patient condition worsened within the last 24 hours, has developed agitation needed security and restrain as well as ativan, urinary retention needed in and out, continues to have global aphasia, and TEE has to be cancelled for today. I spent  35 minutes in total face-to-face time with the patient, more than 50% of which was spent in counseling and coordination of care, reviewing test results, images and medication, and discussing the diagnosis, treatment plan and potential prognosis. This patient's care requiresreview of multiple databases, neurological assessment, discussion with family, other specialists and medical decision making of high complexity. I had long discussion with daughter over the phone, updated pt current condition, treatment plan and potential prognosis, and answered all the questions. She expressed understanding and appreciation.    Rosalin Hawking, MD PhD Stroke Neurology 09/29/2019 2:07 PM  To contact Stroke Continuity provider, please refer to [email or web address] After hours, contact General Neurology

## 2019-09-29 NOTE — Progress Notes (Signed)
PT Cancellation Note  Patient Details Name: MARCEL GARY MRN: [id number] DOB: 02-01-1946   Cancelled Treatment:    Reason Eval/Treat Not Completed: Other (comment) PT held as pt is currently agitated, has gotten himself dressed, and is attempting to leave AMA. Security in room with pt at this time. PT will continue to follow acutely.     [name], PTA Acute Rehabilitation Services Pager: [phone] Office: [phone]   09/29/2019, 11:12 AM

## 2019-09-29 NOTE — Progress Notes (Signed)
SLP Cancellation Note  Patient Details Name: Gerald Rosario MRN: [id number] DOB: 07-Oct-1945   Cancelled treatment:       Reason Eval/Treat Not Completed: Medical issues which prohibited therapy. This morning pt was NPO pending TEE. Procedure was cancelled due to agitation. This afternoon, he is calm but lethargic from medication effects. Discussed situation and recommendations with RN. Will f/u as able.   Osie Bond., M.A. [facility] Rehabilitation Services Pager [phone] Office [phone]  09/29/2019, 4:11 PM

## 2019-09-29 NOTE — Progress Notes (Signed)
Patient scheduled for TEE with [facility] today for stroke. Per primary RN, patient is agitated, he is restrained, given PRN ativan today. Discussed with MDA Dr. Jillyn Hidden, he states unsafe for anesthesia today. Discussed with Dr. Acie Fredrickson, he defers to anesthesia plan. TEE rescheduled for 10/01/19. Primary RN notified of this, per primary RN, patient is awaiting rehab placement and does not have imminent discharge. Cardiology scheduling notified as well.

## 2019-09-29 NOTE — Progress Notes (Signed)
OT Cancellation Note  Patient Details Name: Gerald Rosario MRN: [id number] DOB: 1945-09-09   Cancelled Treatment:    Reason Eval/Treat Not Completed: Patient declined, no reason specified;Other (comment) Per nursing director pt agitated and attempting to leave hospital. Pt already dressed and walking around room. Will check back as situation resolves for OT session.   Lanier Clam., COTA/L Acute Rehabilitation Services [phone]   [name] 09/29/2019, 11:13 AM

## 2019-09-30 DIAGNOSIS — E7800 Pure hypercholesterolemia, unspecified: Secondary | ICD-10-CM

## 2019-09-30 DIAGNOSIS — R338 Other retention of urine: Secondary | ICD-10-CM

## 2019-09-30 LAB — URINE CULTURE: Culture: >=100000 — AB

## 2019-09-30 LAB — BASIC METABOLIC PANEL
Anion gap: 8 (ref 5–15)
BUN: 12 mg/dL (ref 8–23)
CO2: 24 mmol/L (ref 22–32)
Calcium: 9 mg/dL (ref 8.9–10.3)
Chloride: 108 mmol/L (ref 98–111)
Creatinine, Ser: 0.82 mg/dL (ref 0.61–1.24)
GFR calc Af Amer: >60 mL/min (ref >60)
GFR calc non Af Amer: >60 mL/min (ref >60)
Glucose, Bld: 128 mg/dL — ABNORMAL HIGH (ref 70–99)
Potassium: 3.6 mmol/L (ref 3.5–5.1)
Sodium: 140 mmol/L (ref 135–145)

## 2019-09-30 LAB — CBC
HCT: 31.7 % — ABNORMAL LOW (ref 39.0–52.0)
Hemoglobin: 10.3 g/dL — ABNORMAL LOW (ref 13.0–17.0)
MCH: 29.8 pg (ref 26.0–34.0)
MCHC: 32.5 g/dL (ref 30.0–36.0)
MCV: 91.6 fL (ref 80.0–100.0)
Platelets: 367 10*3/uL (ref 150–400)
RBC: 3.46 MIL/uL — ABNORMAL LOW (ref 4.22–5.81)
RDW: 13.2 % (ref 11.5–15.5)
WBC: 7.8 10*3/uL (ref 4.0–10.5)
nRBC: 0 % (ref 0.0–0.2)

## 2019-09-30 LAB — GLUCOSE, CAPILLARY
Glucose-Capillary: 130 mg/dL — ABNORMAL HIGH (ref 70–99)
Glucose-Capillary: 153 mg/dL — ABNORMAL HIGH (ref 70–99)
Glucose-Capillary: 153 mg/dL — ABNORMAL HIGH (ref 70–99)
Glucose-Capillary: 118 mg/dL — ABNORMAL HIGH (ref 70–99)

## 2019-09-30 MED ORDER — QUETIAPINE FUMARATE 25 MG PO TABS
12.5000 mg | ORAL_TABLET | Freq: Every day | ORAL | Status: DC
  Administered 2019-09-30 – 2019-10-06 (×7): 12.5 mg via ORAL
  Filled 2019-09-30 (×7): qty 1

## 2019-09-30 MED ORDER — AMOXICILLIN 500 MG PO CAPS
500.0000 mg | ORAL_CAPSULE | Freq: Three times a day (TID) | ORAL | Status: AC
  Administered 2019-09-30 – 2019-10-07 (×21): 500 mg via ORAL
  Filled 2019-09-30 (×22): qty 1

## 2019-09-30 NOTE — Progress Notes (Signed)
  Speech Language Pathology Treatment: Cognitive-Linquistic  Patient Details Name: Gerald Rosario MRN: [id number] DOB: 09/23/1945 Today's Date: 09/30/2019 Time: 7356-7014 SLP Time Calculation (min) (ACUTE ONLY): 12 min  Assessment / Plan / Recommendation Clinical Impression  Pt is still limited in his verbal expression to the word "remember," which he uses emphatically and with varying intonation to try to get his message across. He had a picture communication board in his hand upon SLP arrival, also pointing to various things on the page but not able to identify any of the receptively. SLP limited options to a field of two: "yes" and "no." Pt still exhibited difficulty differentiating between the two when trying to answer questions. Attempted to use gestures as well with pt only imitating SLP when pointing with an index finger. Max cues were provided to try to train this as a "yes" response, but pt needed consistent demonstrations from SLP that could not be weaned this session. Will continue efforts to try to establish a better means of communication. Of note, pt also took several spoonfuls of nectar thick liquids throughout session with swift appear swallow response and no overt s/s of aspiration. Will need to f/u for additional dysphagia tx, although pt appeared to have just finished his breakfast tray this morning.    HPI HPI: Pt is a 74 yo male presenting with fluctuating weakness. Upon arrival at the ED pt had a sudden onset pof R weakness, L eye deviation, and aphasia. CT showed a hyperdensity in the L MCA compatible with acute thrombus; s/p TPA and emergent mechanical thrombectomy. PMH includes: vocal cord dysfunction, hoarseness (onset 11/09; negative w/u 09/2008; L vocal cord paralysis 8-% recovered), CABG x3, prostate cancer, PAF, ischemic cardiomyopathy, HTN, HLD, dyspnea, DM, CKD, CHF, asthma      SLP Plan  Continue with current plan of care       Recommendations  Diet recommendations:  Dysphagia 2 (fine chop);Nectar-thick liquid Liquids provided via: Teaspoon Medication Administration: Crushed with puree Supervision: Full supervision/cueing for compensatory strategies Compensations: Slow rate;Small sips/bites Postural Changes and/or Swallow Maneuvers: Seated upright 90 degrees                Oral Care Recommendations: Oral care BID Follow up Recommendations: Inpatient Rehab SLP Visit Diagnosis: Aphasia (R47.01);Dysphagia, oropharyngeal phase (R13.12) Plan: Continue with current plan of care       GO                Osie Bond., M.A. [facility] Pager [phone] Office [phone]  09/30/2019, 10:38 AM

## 2019-09-30 NOTE — Progress Notes (Signed)
Advised MD, Dr. Rory Percy, that [facility] notified pt had a 5 beat run of vtach.

## 2019-09-30 NOTE — Progress Notes (Addendum)
STROKE TEAM PROGRESS NOTE   INTERVAL HISTORY   Pt best friend is at bedside. Pt sitting in bed, with restrain, sleepy but arousable. Per best friend, pt was taking out [facility] card in his wallet and indicating he wants to be transfer to [place]. Pending TEE in am. Will put on NPO tonight. Will change seroquel to Qhs.   Vitals:   09/29/19 2341 09/30/19 0347 09/30/19 0917 09/30/19 1243  BP: (!) 160/56 (!) 165/76 (!) 161/66 (!) 159/75  Pulse: (!) 53 69 73 63  Resp: 17 17 18 18   Temp: 97.6 F (36.4 C) 98.6 F (37 C) 97.7 F (36.5 C) 97.6 F (36.4 C)  TempSrc: Oral  Oral Oral  SpO2: 100% 100% 100% 100%  Weight:      Height:        CBC:  Recent Labs  Lab 09/24/19 1046 09/24/19 1052 09/25/19 0444 09/28/19 0308 09/29/19 0319 09/30/19 0326  WBC 10.4   < > 10.6*   < > 7.7 7.8  NEUTROABS 8.1*  --  8.2*  --   --   --   HGB 11.9*   < > 10.8*   < > 9.8* 10.3*  HCT 38.5*   < > 33.8*   < > 30.6* 31.7*  MCV 93.2   < > 92.9   < > 91.3 91.6  PLT 405*   < > 387   < > 311 367   < > = values in this interval not displayed.    Basic Metabolic Panel:  Recent Labs  Lab 09/28/19 0308 09/28/19 0308 09/29/19 0319 09/30/19 0326  NA 137   < > 139 140  K 3.7   < > 3.9 3.6  CL 104   < > 107 108  CO2 25   < > 25 24  GLUCOSE 141*   < > 141* 128*  BUN 11   < > 13 12  CREATININE 0.99   < > 0.98 0.82  CALCIUM 8.8*   < > 8.9 9.0  MG 1.7  --   --   --   PHOS 3.5  --   --   --    < > = values in this interval not displayed.   Lipid Panel:     Component Value Date/Time   CHOL 118 09/25/2019 0444   TRIG 209 (H) 09/25/2019 0444   HDL 38 (L) 09/25/2019 0444   CHOLHDL 3.1 09/25/2019 0444   VLDL 42 (H) 09/25/2019 0444   LDLCALC 38 09/25/2019 0444   HgbA1c:  Lab Results  Component Value Date   HGBA1C 6.2 (H) 09/25/2019   Urine Drug Screen:     Component Value Date/Time   LABOPIA NONE DETECTED 09/24/2019 1715   COCAINSCRNUR NONE DETECTED 09/24/2019 1715   LABBENZ POSITIVE (A) 09/24/2019 1715    AMPHETMU NONE DETECTED 09/24/2019 1715   THCU NONE DETECTED 09/24/2019 1715   LABBARB NONE DETECTED 09/24/2019 1715    Alcohol Level     Component Value Date/Time   ETH <10 09/24/2019 1046    IMAGING past 24 hours No results found.  PHYSICAL EXAM    Temp:  [97.5 F (36.4 C)-98.6 F (37 C)] 97.6 F (36.4 C) (06/29 1243) Pulse Rate:  [53-73] 63 (06/29 1243) Resp:  [17-18] 18 (06/29 1243) BP: (144-165)/(56-84) 159/75 (06/29 1243) SpO2:  [100 %] 100 % (06/29 1243)  General - Well nourished, well developed, in no apparent distress, sleepy.  Ophthalmologic - fundi not visualized due to noncooperation.  Cardiovascular - Regular rhythm and rate.  Neuro - sleepy but easily arousable, global aphasia, not following commands, nonverbal except "remember" for everything.  Not able to name or repeat.  Eyes attending to both eyes, no gaze palsy, blinking to visual threat bilaterally.  PERRL, right mild facial droop.  Bilateral upper extremity and lower extremity symmetrical movement except right hand grip strength decreased and right pronator drift.  DTR 1+ and no Babinski.  Sensation, coordination and gait not tested.  ASSESSMENT/PLAN Gerald Rosario is a 74 y.o. male with history of  prostate cancer, CABG x3, PAF, ischemic cardiomyopathy, hypertension, hyperlipidemia, diabetes and CAD presenting from PCP with transient L gaze deviation and R sided weakness. Upon arrival to ED pt again with sudden onset L eye deviation, aphasia, R sided weakness and R facial droop. He received IV tPA 09/24/2019 at 1102, CTA showed L ICA and L M1 occlusion and pt was taken for IR.  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 Extensive B cavernous ICA atherosclerosis.  L M1 occlusion w/ poor collaterals. Proximal R ICA 40% stenosis. Mild to moderate distal B VA stenoses.   Cerebral angio - intra and extracranial L ICA occlusion and L MCA, L ACA occlusions - TICI3 revascularization    MRI  Small L cerebral (posterior L MCA/watershed) infarct. 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 No source of embolus. Sluggish LV flow but no thrombus seen. LA moderate dilated. 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   TEE scheduled for 6/30   If TEE not revealing, recommend 30 day cardiac event monitoring (pt had loop 2015-2018 no afib)  LDL 38  HgbA1c 6.2  SCDs for VTE prophylaxis  aspirin 81 mg daily prior to admission, now on aspirin 325 mg daily and clopidogrel 75 mg daily x 3 months then plavix alone    Therapy recommendations:  SNF  Disposition:  pending    Daughter estranged from pt. Requests any decisions be made by pt's cousin, Gatha Mayer. However, daughter has not provided cousin's number for [place] to contact  Arrythmia  Hx paroxsymal Atrial Fibrillation post CABG  Had loop for syncope - inserted 08/20/2013 removed 10/11/2016 at EOL - no arrhythmias noted per [facility]'s note prior to explant  5-beat VT 6/29, asymptomatic  Given current stroke, may consider 30 day cardiac event monitoring as outpt.   Hypertension  Home meds:  Lasix 20, metoprolol 50 bid  Off cleviprex  Prn labetolol    BP stable but on the high end  Resumed home meds   Long-term BP goal normotensive  Hyperlipidemia  Home meds:  lipitor 40, fish oil and red rice yeast  Resumed home statin and fish oil in hospital  LDL 38, goal < 70  Continue statin at discharge  Diabetes type II Controlled  Home meds:  Glipizide 10 bid  HgbA1c 6.2, goal < 7.0  SSI 0-20->0-6  CBGs  Hypoglycemia episode 6/26 -> received orange juice and D50 25cc  Glipizide d/c'ed  PCP follow up  Dysphagia  Secondary to stroke  MBSS cleared for D2 with nectar  thick liquids  IVF @ 40  Speech on board   Leukocytosis  WBC 10.6->13.4->7.7->7.8  afebrile  UA 6/23 WBC > 50  UA repeat >50 WBC  UCx >100k enterococcus faecalis   Rocephin 6/27>>6/29  Amoxicillin 500 q8h>>  Urinary retention  Hx of prostate cancer  foley d/c'ed in the setting of UTI  Bladder scan Q6  In and out cath Q6h PRN  S/p in and out x 1 6/28  Agitation   Trying to leave hospital   Soft restrain now  Ativan x 1  Put on low dose seroquel bid -> sleepy ->change to Qhs  Other Stroke Risk Factors  Advanced age  Former Cigarette smoker, advised to stop smoking  ETOH use, alcohol level <10, advised to drink no more than 2 drink(s) a day  Family hx - father died of MI at age 3  Coronary artery disease s/p CABG x 3  Chronic systolic congestive heart failure  Ischemic cardiomyopathy    Other Active Problems  Prostate cancer   Anemia - Hgb - 11.9->10.8->10.2->9.8->10.3  Hospital day # 6   Rosalin Hawking, MD PhD Stroke Neurology 09/30/2019 1:56 PM  To contact Stroke Continuity provider, please refer to [email or web address] After hours, contact General Neurology

## 2019-09-30 NOTE — Progress Notes (Signed)
Physical Therapy Treatment Patient Details Name: Gerald Rosario MRN: [id number] DOB: July 10, 1945 Today's Date: 09/30/2019    History of Present Illness 74 y.o. male with history of prostate cancer, CABG x3, PAF, ischemic cardiomyopathy, hypertension, hyperlipidemia, diabetes and CAD.  Patient was at his primary care physician's office today to further evaluate episodes of losing strength in all extremities.  While he was at the primary care office patient had sudden onset of left gaze deviation and right-sided weakness. 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 Pt went to IR on 6/23 for revacularization.    PT Comments    Patient seen for mobility progression. Pt continues to require assistance for OOB mobility given R lateral bias and impaired balance. Pt only verbalized the word "remember" throughout session however intermittently mouthing words to music while ambulating. Pt seemed to really enjoy listening to music during session and began dancing in the hall. Min guard-min A for functional transfer training and min-mod A for gait training (+2 for safety). Continue to recommend CIR for further skilled PT services to maximize independence and safety with mobility.    Follow Up Recommendations  CIR     Equipment Recommendations  Other (comment) (TBD pending progress)    Recommendations for Other Services Rehab consult     Precautions / Restrictions Precautions Precautions: Fall Restrictions Weight Bearing Restrictions: No    Mobility  Bed Mobility Overal bed mobility: Needs Assistance Bed Mobility: Sit to Supine;Supine to Sit     Supine to sit: Supervision Sit to supine: Supervision   General bed mobility comments: supervision for safety due to impulsivity and impaired balance  Transfers Overall transfer level: Needs assistance Equipment used: 1 person hand held assist Transfers: Sit to/from Stand Sit to Stand: Min guard;Min assist         General transfer  comment: min guard to stand and min A upon standing due to instability   Ambulation/Gait Ambulation/Gait assistance: Min assist;Mod assist;+2 safety/equipment Gait Distance (Feet): 150 Feet Assistive device:  (assistance at trunk with gait belt ) Gait Pattern/deviations: Step-through pattern;Decreased stride length;Drifts right/left;Staggering left;Staggering right;Narrow base of support Gait velocity: reduced   General Gait Details: assistance required for balance/weight shifting; R lateral bias and increased R LE weakness with distance    Stairs             Wheelchair Mobility    Modified Rankin (Stroke Patients Only) Modified Rankin (Stroke Patients Only) Pre-Morbid Rankin Score: No symptoms Modified Rankin: Moderately severe disability     Balance Overall balance assessment: Needs assistance Sitting-balance support: No upper extremity supported;Feet supported Sitting balance-Leahy Scale: Fair       Standing balance-Leahy Scale: Poor Standing balance comment: posterior bias in standing at sink washing face                             Cognition Arousal/Alertness: Awake/alert Behavior During Therapy: Restless;WFL for tasks assessed/performed Overall Cognitive Status: Difficult to assess                                 General Comments: pt follows single step cues inconsistently; continues to only verbalize "remember" however when ambulating therapist played music and pt mouthing words intermittently and dancing       Exercises      General Comments        Pertinent Vitals/Pain Pain Assessment: Faces  Faces Pain Scale: No hurt Pain Intervention(s): Monitored during session    Home Living                      Prior Function            PT Goals (current goals can now be found in the care plan section) Progress towards PT goals: Progressing toward goals    Frequency    Min 4X/week      PT Plan Current plan  remains appropriate    Co-evaluation              AM-PAC PT "6 Clicks" Mobility   Outcome Measure  Help needed turning from your back to your side while in a flat bed without using bedrails?: A Little Help needed moving from lying on your back to sitting on the side of a flat bed without using bedrails?: A Little Help needed moving to and from a bed to a chair (including a wheelchair)?: A Little Help needed standing up from a chair using your arms (e.g., wheelchair or bedside chair)?: A Little Help needed to walk in hospital room?: A Lot Help needed climbing 3-5 steps with a railing? : A Lot 6 Click Score: 16    End of Session Equipment Utilized During Treatment: Gait belt Activity Tolerance: Patient tolerated treatment well Patient left: in bed;with call bell/phone within reach;with bed alarm set;with restraints reapplied;Other (comment) (posey belt) Nurse Communication: Mobility status PT Visit Diagnosis: Other abnormalities of gait and mobility (R26.89);Muscle weakness (generalized) (M62.81);Other symptoms and signs involving the nervous system (R29.898);Hemiplegia and hemiparesis Hemiplegia - Right/Left: Right Hemiplegia - dominant/non-dominant: Dominant Hemiplegia - caused by: Cerebral infarction     Time: 1530-1600 PT Time Calculation (min) (ACUTE ONLY): 30 min  Charges:  $Gait Training: 23-37 mins                     [name], PTA Acute Rehabilitation Services Pager: [phone] Office: [phone]     Darliss Cheney 09/30/2019, 4:56 PM

## 2019-10-01 ENCOUNTER — Encounter (HOSPITAL_COMMUNITY): Payer: Self-pay | Admitting: Neurology

## 2019-10-01 ENCOUNTER — Inpatient Hospital Stay (HOSPITAL_COMMUNITY): Payer: Medicare HMO | Admitting: Anesthesiology

## 2019-10-01 ENCOUNTER — Inpatient Hospital Stay (HOSPITAL_COMMUNITY): Payer: Medicare HMO

## 2019-10-01 ENCOUNTER — Encounter (HOSPITAL_COMMUNITY)
Admission: EM | Disposition: A | Discharge status: Skilled Nursing Facility | Payer: Self-pay | Source: Ambulatory Visit | Attending: Neurology

## 2019-10-01 DIAGNOSIS — I340 Nonrheumatic mitral (valve) insufficiency: Secondary | ICD-10-CM

## 2019-10-01 HISTORY — PX: BUBBLE STUDY: SHX6837

## 2019-10-01 HISTORY — PX: TEE WITHOUT CARDIOVERSION: SHX5443

## 2019-10-01 LAB — BASIC METABOLIC PANEL
Anion gap: 7 (ref 5–15)
BUN: 12 mg/dL (ref 8–23)
CO2: 25 mmol/L (ref 22–32)
Calcium: 9 mg/dL (ref 8.9–10.3)
Chloride: 108 mmol/L (ref 98–111)
Creatinine, Ser: 0.93 mg/dL (ref 0.61–1.24)
GFR calc Af Amer: >60 mL/min (ref >60)
GFR calc non Af Amer: >60 mL/min (ref >60)
Glucose, Bld: 126 mg/dL — ABNORMAL HIGH (ref 70–99)
Potassium: 3.9 mmol/L (ref 3.5–5.1)
Sodium: 140 mmol/L (ref 135–145)

## 2019-10-01 LAB — GLUCOSE, CAPILLARY
Glucose-Capillary: 160 mg/dL — ABNORMAL HIGH (ref 70–99)
Glucose-Capillary: 191 mg/dL — ABNORMAL HIGH (ref 70–99)
Glucose-Capillary: 195 mg/dL — ABNORMAL HIGH (ref 70–99)

## 2019-10-01 LAB — CBC
HCT: 32.6 % — ABNORMAL LOW (ref 39.0–52.0)
Hemoglobin: 10.4 g/dL — ABNORMAL LOW (ref 13.0–17.0)
MCH: 29 pg (ref 26.0–34.0)
MCHC: 31.9 g/dL (ref 30.0–36.0)
MCV: 90.8 fL (ref 80.0–100.0)
Platelets: 368 10*3/uL (ref 150–400)
RBC: 3.59 MIL/uL — ABNORMAL LOW (ref 4.22–5.81)
RDW: 13.1 % (ref 11.5–15.5)
WBC: 7.8 10*3/uL (ref 4.0–10.5)
nRBC: 0 % (ref 0.0–0.2)

## 2019-10-01 SURGERY — ECHOCARDIOGRAM, TRANSESOPHAGEAL
Anesthesia: Monitor Anesthesia Care

## 2019-10-01 MED ORDER — AMLODIPINE BESYLATE 10 MG PO TABS
10.0000 mg | ORAL_TABLET | Freq: Every day | ORAL | Status: DC
  Administered 2019-10-01 – 2019-10-07 (×7): 10 mg via ORAL
  Filled 2019-10-01 (×7): qty 1

## 2019-10-01 MED ORDER — BUTAMBEN-TETRACAINE-BENZOCAINE 2-2-14 % EX AERO
INHALATION_SPRAY | CUTANEOUS | Status: DC | PRN
  Administered 2019-10-01: 2 via TOPICAL

## 2019-10-01 MED ORDER — LACTATED RINGERS IV SOLN
INTRAVENOUS | Status: AC | PRN
  Administered 2019-10-01: 1000 mL via INTRAVENOUS

## 2019-10-01 MED ORDER — PHENYLEPHRINE HCL-NACL 10-0.9 MG/250ML-% IV SOLN
INTRAVENOUS | Status: DC | PRN
  Administered 2019-10-01: 25 ug/min via INTRAVENOUS

## 2019-10-01 MED ORDER — PROPOFOL 500 MG/50ML IV EMUL
INTRAVENOUS | Status: DC | PRN
  Administered 2019-10-01: 100 ug/kg/min via INTRAVENOUS

## 2019-10-01 MED ORDER — LIDOCAINE 2% (20 MG/ML) 5 ML SYRINGE
INTRAMUSCULAR | Status: DC | PRN
Start: 2019-10-01 — End: 2019-10-01
  Administered 2019-10-01: 30 mg via INTRAVENOUS

## 2019-10-01 MED ORDER — SODIUM CHLORIDE 0.9 % IV SOLN: INTRAVENOUS | Status: DC | PRN

## 2019-10-01 MED ORDER — PROPOFOL 10 MG/ML IV BOLUS
INTRAVENOUS | Status: DC | PRN
  Administered 2019-10-01: 25 mg via INTRAVENOUS
  Administered 2019-10-01 (×2): 20 mg via INTRAVENOUS

## 2019-10-01 NOTE — NC FL2 (Signed)
[place] MEDICAID FL2 LEVEL OF CARE SCREENING TOOL     IDENTIFICATION  Patient Name: Gerald Rosario Birthdate: 10-26-1945 Sex: male Admission Date (Current Location): 09/24/2019  [facility] and [place] Number:  Herbalist and Address:  [facility], [place] [address] [place], [place] [address]      Provider Number: [id number]  Attending Physician Name and Address:  Rosalin Hawking, MD  Relative Name and Phone Number:  Glendell Docker    Current Level of Care: Hospital Recommended Level of Care: [place] Prior Approval Number:    Date Approved/Denied:   PASRR Number: [id number]  Discharge Plan: SNF    Current Diagnoses: Patient Active Problem List   Diagnosis Date Noted  . Prostate cancer ([place]) 09/24/2019  . Stroke (cerebrum) ([place]) 09/24/2019  . Collapse 09/24/2019  . Weakness 09/24/2019  . Middle cerebral artery embolism, left 09/24/2019  . Peripheral neuropathy 03/21/2018  . Hammer toes of both feet 03/21/2018  . Lightheadedness 09/20/2017  . Hypothyroidism 09/19/2017  . Heel pain, chronic, right 03/09/2016  . GERD (gastroesophageal reflux disease) 08/23/2015  . Iron deficiency anemia 08/23/2015  . Mild persistent chronic asthma without complication 17/51/0258  . CAD (coronary artery disease)   . PAF (paroxysmal atrial fibrillation) ([place])   . History of amiodarone therapy 01/23/2014  . Non-STEMI (non-ST elevated myocardial infarction) ([place]) 01/23/2014  . 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 Pleural effusion 08/20/2013  . Syncope and collapse 08/20/2013  . Multiple fractures of ribs of left side 08/18/2013  . Chronic sinusitis 08/18/2013  . Enlarged prostate   . 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 Chronic systolic heart failure ([place]) 04/10/2011  . Hypercholesterolemia 12/09/2010  . Ischemic cardiomyopathy 11/08/2010  . Diabetes mellitus ([place]) 11/08/2010  .  Coronary artery disease 11/07/2010  . Extrinsic asthma 01/17/2010  . Essential hypertension 03/20/2007  . PULMONARY NODULE, RIGHT MIDDLE LOBE 03/20/2007    Orientation RESPIRATION BLADDER Height & Weight     Self  Normal Incontinent, External catheter Weight: 151 lb 14.4 oz (68.9 kg) Height:  5\' 8"  (172.7 cm)  BEHAVIORAL SYMPTOMS/MOOD NEUROLOGICAL BOWEL NUTRITION STATUS      Continent Diet (See discharge summary)  AMBULATORY STATUS COMMUNICATION OF NEEDS Skin   Extensive Assist Verbally ([place]) Normal                       Personal Care Assistance Level of Assistance  Bathing, Feeding, Dressing Bathing Assistance: Maximum assistance Feeding assistance: Maximum assistance Dressing Assistance: Maximum assistance     Functional Limitations Info  Sight, Speech, Hearing Sight Info: Adequate Hearing Info: Adequate Speech Info: Impaired (Aphasia)    SPECIAL CARE FACTORS FREQUENCY  PT (By licensed PT), OT (By licensed OT)     PT Frequency: 5x a week OT Frequency: 5x a week            Contractures Contractures Info: Not present    Additional Factors Info  Code Status, Allergies Code Status Info: FULL Allergies Info: Sulfa Antibiotics           Current Medications (10/01/2019):  This is the current hospital active medication list Current Facility-Administered Medications  Medication Dose Route Frequency Provider Last Rate Last Admin  . 0.9 %  sodium chloride infusion   Intravenous Continuous Rosalin Hawking, MD 40 mL/hr at 09/28/19 1333 New Bag at 09/28/19 1333  . acetaminophen (TYLENOL) tablet 650 mg  650 mg Oral Q4H PRN Luanne Bras, MD       Or  . acetaminophen (TYLENOL) 160 MG/5ML solution 650 mg  650 mg Per Tube Q4H PRN Deveshwar, Sanjeev, MD       Or  . acetaminophen (TYLENOL) suppository 650 mg  650 mg Rectal Q4H PRN Luanne Bras, MD   650 mg at 09/24/19 2358  . amoxicillin (AMOXIL) capsule 500 mg  500 mg Oral Q8H Rosalin Hawking, MD   500 mg at  10/01/19 0926  . aspirin EC tablet 325 mg  325 mg Oral q morning - 10a Rosalin Hawking, MD   325 mg at 10/01/19 1194  . atorvastatin (LIPITOR) tablet 40 mg  40 mg Oral QHS Burnetta Sabin L, NP   40 mg at 09/30/19 2140  . Chlorhexidine Gluconate Cloth 2 % PADS 6 each  6 each Topical Daily Aroor, Lanice Schwab, MD   6 each at 10/01/19 [phone]  . clopidogrel (PLAVIX) tablet 75 mg  75 mg Oral Daily Burnetta Sabin L, NP   75 mg at 10/01/19 0925  . ferrous sulfate tablet 325 mg  325 mg Oral Q M,W,F Donzetta Starch, NP   325 mg at 10/01/19 8144  . finasteride (PROSCAR) tablet 5 mg  5 mg Oral Daily Burnetta Sabin L, NP   5 mg at 10/01/19 8185  . furosemide (LASIX) tablet 20 mg  20 mg Oral q AM Donzetta Starch, NP   20 mg at 10/01/19 1134  . insulin aspart (novoLOG) injection 0-6 Units  0-6 Units Subcutaneous TID WC Rosalin Hawking, MD   1 Units at 09/30/19 1745  . labetalol (NORMODYNE) injection 20 mg  20 mg Intravenous Once Marliss Coots, PA-C      . labetalol (NORMODYNE) injection 20-40 mg  20-40 mg Intravenous Q2H PRN Donzetta Starch, NP      . levothyroxine (SYNTHROID) tablet 75 mcg  75 mcg Oral QAC breakfast Marliss Coots, PA-C   75 mcg at 10/01/19 6314  . metoprolol tartrate (LOPRESSOR) tablet 50 mg  50 mg Oral BID Burnetta Sabin L, NP   50 mg at 10/01/19 0925  . mometasone-formoterol (DULERA) 100-5 MCG/ACT inhaler 2 puff  2 puff Inhalation BID Donzetta Starch, NP   2 puff at 10/01/19 0810  . multivitamin with minerals tablet 1 tablet  1 tablet Oral Daily Donzetta Starch, NP   1 tablet at 10/01/19 9702  . omega-3 acid ethyl esters (LOVAZA) capsule 1 g  1 g Oral Daily Burnetta Sabin L, NP   1 g at 10/01/19 0925  . pantoprazole sodium (PROTONIX) 40 mg/20 mL oral suspension 40 mg  40 mg Oral QHS Karren Cobble, RPH   40 mg at 09/30/19 2140  . polyvinyl alcohol (LIQUIFILM TEARS) 1.4 % ophthalmic solution 1 drop  1 drop Both Eyes QID Garvin Fila, MD   1 drop at 10/01/19 0929  . QUEtiapine (SEROQUEL) tablet 12.5 mg  12.5  mg Oral QHS Rosalin Hawking, MD   12.5 mg at 09/30/19 2100  . Resource ThickenUp Clear   Oral PRN Aroor, Lanice Schwab, MD      . senna-docusate (Senokot-S) tablet 1 tablet  1 tablet Oral QHS PRN Marliss Coots, PA-C      . tamsulosin [facility]) capsule 0.4 mg  0.4 mg Oral Daily Donzetta Starch, NP   0.4 mg at 10/01/19 6378     Discharge Medications: Please see discharge summary for a list of discharge medications.  Relevant Imaging Results:  Relevant Lab Results:   Additional Information SSN [id number]  Geralynn Ochs, LCSW

## 2019-10-01 NOTE — Progress Notes (Signed)
Physical Therapy Treatment Note  Patient seen for mobility progression. Pt continues to require min-mod A for OOB mobility due to impaired balance and decreased awareness of safety/deficits. This session focused on gait training and standing balance activities. Pt continues to perseverate on word "remember" however attempting to sing along with songs and said "you" when singing happy birthday song. Continue to recommend CIR for further skilled PT services to maximize independence and safety with mobility.    10/01/19 1338  PT Visit Information  Last PT Received On 10/01/19  Assistance Needed +2 (for safety and mobility progression)  PT/OT/SLP Co-Evaluation/Treatment Yes  Reason for Co-Treatment Necessary to address cognition/behavior during functional activity;For patient/therapist safety  PT goals addressed during session Mobility/safety with mobility;Balance  History of Present Illness 74 y.o. male with history of prostate cancer, CABG x3, PAF, ischemic cardiomyopathy, hypertension, hyperlipidemia, diabetes and CAD.  Patient was at his primary care physician's office today to further evaluate episodes of losing strength in all extremities.  While he was at the primary care office patient had sudden onset of left gaze deviation and right-sided weakness. 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 Pt went to IR on 6/23 for revacularization.  Precautions  Precautions Fall  Restrictions  Weight Bearing Restrictions No  Pain Assessment  Pain Assessment Faces  Faces Pain Scale 0  Cognition  Arousal/Alertness Awake/alert  Behavior During Therapy Restless  Overall Cognitive Status Difficult to assess  General Comments pt able to follow commands with multmodal cues. continues to constantly state " remember" but did say "you" when singing happy birthday. pt appears to like music and will state "remeber" to the tune of songs. pt trying to word find lyrics to songs throuhgout session   Difficult to assess due to Impaired communication  Bed Mobility  Overal bed mobility Needs Assistance  Bed Mobility Supine to Sit  Supine to sit Supervision  General bed mobility comments supervision for safety due to impulsivity and impaired balance  Transfers  Overall transfer level Needs assistance  Equipment used 1 person hand held assist  Transfers Sit to/from Stand  Sit to Stand Min guard;Min assist  General transfer comment min guard- MIN A for safety and balance upon initial stand  Ambulation/Gait  Ambulation/Gait assistance Min assist;Mod assist;+2 safety/equipment  Gait Distance (Feet) 200 Feet  Assistive device  (assistance at trunk with gait belt )  Gait Pattern/deviations Step-through pattern;Decreased stride length;Drifts right/left;Staggering left;Staggering right;Narrow base of support  General Gait Details assistance required for balance/weight shifting; R lateral bias, narrow BOS, and multiple LOB during session  Gait velocity reduced  Modified Rankin (Stroke Patients Only)  Pre-Morbid Rankin Score 0  Modified Rankin 4  Balance  Overall balance assessment Needs assistance  Sitting-balance support No upper extremity supported;Feet supported  Sitting balance-Leahy Scale Fair  Sitting balance - Comments minguard for sitting balance  Standing balance-Leahy Scale Poor  PT - End of Session  Equipment Utilized During Treatment Gait belt  Activity Tolerance Patient tolerated treatment well  Patient left with call bell/phone within reach;in chair;with chair alarm set  Nurse Communication Mobility status   PT - Assessment/Plan  PT Plan Current plan remains appropriate  PT Visit Diagnosis Other abnormalities of gait and mobility (R26.89);Muscle weakness (generalized) (M62.81);Other symptoms and signs involving the nervous system (R29.898);Hemiplegia and hemiparesis  Hemiplegia - Right/Left Right  Hemiplegia - dominant/non-dominant Dominant  Hemiplegia - caused by  Cerebral infarction  PT Frequency (ACUTE ONLY) Min 4X/week  Recommendations for Other Services Rehab  consult  Follow Up Recommendations CIR  PT equipment Other (comment) (TBD pending progress)  AM-PAC PT "6 Clicks" Mobility Outcome Measure (Version 2)  Help needed turning from your back to your side while in a flat bed without using bedrails? 3  Help needed moving from lying on your back to sitting on the side of a flat bed without using bedrails? 3  Help needed moving to and from a bed to a chair (including a wheelchair)? 3  Help needed standing up from a chair using your arms (e.g., wheelchair or bedside chair)? 3  Help needed to walk in hospital room? 2  Help needed climbing 3-5 steps with a railing?  2  6 Click Score 16  Consider Recommendation of Discharge To: Home with [facility]  PT Goal Progression  Progress towards PT goals Progressing toward goals  PT Time Calculation  PT Start Time (ACUTE ONLY) 0936  PT Stop Time (ACUTE ONLY) 1013  PT Time Calculation (min) (ACUTE ONLY) 37 min  PT General Charges  $$ ACUTE PT VISIT 1 Visit  PT Treatments  $Gait Training 8-22 mins   [name], PTA Acute Rehabilitation Services Pager: [phone] Office: [phone]

## 2019-10-01 NOTE — TOC Progression Note (Signed)
Transition of Care [facility]) - Progression Note    Patient Details  Name: Gerald Rosario MRN: [id number] Date of Birth: 07/05/1945  Transition of Care [facility]) CM/SW [place], [place] Phone Number: 10/01/2019, 11:19 AM  Clinical Narrative:   CSW spoke with patient's daughter, [name], to provide bed offers. [name] explained that their relationship is estranged but she is helping to make decisions for him, and that she is aware of his medical issues and ultimately thinks she wants to pursue a palliative route for him. [name] agreeable to SNF, but unfamiliar with the choices available in the area as she is not local. [name] agreeable to placing the patient in SNF closest to the hospital. CSW reached out to [place] and asked them to initiate [facility] authorization. CSW to follow.    Expected Discharge Plan: [place] Barriers to Discharge: Continued Medical Work up, Ship broker  Expected Discharge Plan and Services Expected Discharge Plan: [place] arrangements for the past 2 months: Single Family Home                                       Social Determinants of Health (SDOH) Interventions    Readmission Risk Interventions No flowsheet data found.

## 2019-10-01 NOTE — Plan of Care (Signed)
Received phone call from daughter. She is asking for TEE result and I told her that the result is not available now but likely will be available later today or tomorrow. We will keep her posted. She also stated that she understand pt hx of prostate cancer and now with urinary retention needing frequent I&Os with now foley catheter and she is ICU nurse. With all pt medical conditions, she like to make pt DNR status. I will put in order.   Rosalin Hawking, MD PhD Stroke Neurology 10/01/2019 5:10 PM

## 2019-10-01 NOTE — Transfer of Care (Signed)
Immediate Anesthesia Transfer of Care Note  Patient: Gerald Rosario  Procedure(s) Performed: TRANSESOPHAGEAL ECHOCARDIOGRAM (TEE) (N/A ) BUBBLE STUDY  Patient Location: PACU and Endoscopy Unit  Anesthesia Type:MAC  Level of Consciousness: awake and patient cooperative  Airway & Oxygen Therapy: Patient Spontanous Breathing and Patient connected to nasal cannula oxygen  Post-op Assessment: Report given to RN and Post -op Vital signs reviewed and stable  Post vital signs: Reviewed and stable  Last Vitals:  Vitals Value Taken Time  BP 127/46 10/01/19 1513  Temp    Pulse 58 10/01/19 1514  Resp 12 10/01/19 1514  SpO2 100 % 10/01/19 1514  Vitals shown include unvalidated device data.  Last Pain:  Vitals:   10/01/19 1330  TempSrc: Oral  PainSc: 0-No pain         Complications: No complications documented.

## 2019-10-01 NOTE — Anesthesia Procedure Notes (Signed)
Procedure Name: MAC Date/Time: 10/01/2019 2:30 PM Performed by: Renato Shin, CRNA Pre-anesthesia Checklist: Patient identified, Emergency Drugs available, Suction available and Patient being monitored Patient Re-evaluated:Patient Re-evaluated prior to induction Oxygen Delivery Method: Nasal cannula Preoxygenation: Pre-oxygenation with 100% oxygen Induction Type: IV induction Placement Confirmation: positive ETCO2 and breath sounds checked- equal and bilateral Dental Injury: Teeth and Oropharynx as per pre-operative assessment

## 2019-10-01 NOTE — Interval H&P Note (Signed)
History and Physical Interval Note:  10/01/2019 2:17 PM  Gerald Rosario  has presented today for surgery, with the diagnosis of stroke.  The various methods of treatment have been discussed with the patient and family. After consideration of risks, benefits and other options for treatment, the patient has consented to  Procedure(s): TRANSESOPHAGEAL ECHOCARDIOGRAM (TEE) (N/A) as a surgical intervention.  The patient's history has been reviewed, patient examined, no change in status, stable for surgery.  I have reviewed the patient's chart and labs.  Questions were answered to the patient's satisfaction.     [name]

## 2019-10-01 NOTE — Anesthesia Preprocedure Evaluation (Addendum)
Anesthesia Evaluation  Patient identified by MRN, date of birth, ID band Patient awake and Patient confused    Reviewed: Allergy & Precautions, NPO status , Patient's Chart, lab work & pertinent test results, reviewed documented beta blocker date and time , Unable to perform ROS - Chart review only  History of Anesthesia Complications Negative for: history of anesthetic complications  Airway       Comment: Pt unable to open mouth to command Dental  (+) Dental Advisory Given   Pulmonary COPD,  COPD inhaler, former smoker,  09/24/2019 SARS coronavirus NEG   breath sounds clear to auscultation       Cardiovascular hypertension, Pt. on medications and Pt. on home beta blockers (-) angina+ CAD, + Cardiac Stents and + CABG  + dysrhythmias Atrial Fibrillation  Rhythm:Regular Rate:Abnormal  09/25/2019 ECHO: EF 50-55%, apical akinesis   Neuro/Psych CVA (pt non-communicative), Residual Symptoms    GI/Hepatic negative GI ROS, Neg liver ROS,   Endo/Other  diabetes (glu 126), Oral Hypoglycemic AgentsHypothyroidism   Renal/GU Renal InsufficiencyRenal disease     Musculoskeletal   Abdominal   Peds  Hematology  (+) Blood dyscrasia (Hb 10.4), ,   Anesthesia Other Findings   Reproductive/Obstetrics                            Anesthesia Physical Anesthesia Plan  ASA: IV  Anesthesia Plan: MAC   Post-op Pain Management:    Induction: Intravenous  PONV Risk Score and Plan: 2 and Treatment may vary due to age or medical condition  Airway Management Planned: Natural Airway and Nasal Cannula  Additional Equipment: None  Intra-op Plan:   Post-operative Plan:   Informed Consent: I have reviewed the patients History and Physical, chart, labs and discussed the procedure including the risks, benefits and alternatives for the proposed anesthesia with the patient or authorized representative who has indicated  his/her understanding and acceptance.     Dental advisory given, Consent reviewed with POA and History available from chart only  Plan Discussed with: CRNA  Anesthesia Plan Comments: (Discussed with patient's daughter, [name], by telephone)       Anesthesia Quick Evaluation

## 2019-10-01 NOTE — Progress Notes (Signed)
Initial Nutrition Assessment  DOCUMENTATION CODES:   Not applicable  INTERVENTION:  Once pt returns to Dysphagia 2 diet with Nectar Thick liquids, begin:  Vital Cuisine shake po BID, each supplement provides 500 kcal and 22 grams of protein  NUTRITION DIAGNOSIS:   Inadequate oral intake related to dysphagia as evidenced by energy intake < 75% for > 7 days.    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 PMH includes hx of prostate cancer, CABGx3, PAF, ischemic cardiomyopathy, HTN, HLD, DM, and CAD.   Pt unavailable at time of RD visit.    Per MD, etiology of stroke could be due to large vessel disease; however, a cardioembolic source cannot be ruled out. TEE pending.  PO Intake: 35-100% x 4 recorded meals (58% average meal intake)  UOP: 587ml x24 hours I/O: -2,162.31ml since admit  Labs: CBGs [phone] Medications: Ferrous sulfate, Lasix, Novolog, MVI, Lovaza, Protonix  NUTRITION - FOCUSED PHYSICAL EXAM:  Unable to perform at this time, will attempt at follow-up.   Diet Order:   Diet Order            Diet NPO time specified  Diet effective midnight                 EDUCATION NEEDS:   No education needs have been identified at this time  Skin:  Skin Assessment: Skin Integrity Issues: Skin Integrity Issues:: Incisions Incisions: R groin  Last BM:  6/28  Height:   Ht Readings from Last 1 Encounters:  10/01/19 5\' 8"  (1.727 m)    Weight:   Wt Readings from Last 10 Encounters:  10/01/19 68.9 kg  09/24/19 65.3 kg  03/21/19 69.9 kg  09/19/18 68.5 kg  03/21/18 66.7 kg  09/20/17 65.8 kg  09/20/17 65.8 kg  03/23/17 71.2 kg  10/11/16 67.1 kg  09/14/16 66.7 kg    BMI:   Body mass index is 23.1 kg/m.  Estimated Nutritional Needs:   Kcal:  1800-2000  Protein:  85-100 grams  Fluid:  >1.8L/d    Larkin Ina, MS, RD, LDN RD pager number and weekend/on-call pager number located in [place].

## 2019-10-01 NOTE — Plan of Care (Signed)
  Problem: Nutrition: Goal: Adequate nutrition will be maintained Outcome: Progressing   Problem: Pain Managment: Goal: General experience of comfort will improve Outcome: Progressing   Problem: Skin Integrity: Goal: Risk for impaired skin integrity will decrease Outcome: Progressing   

## 2019-10-01 NOTE — Progress Notes (Signed)
Occupational Therapy Treatment Patient Details Name: Gerald Rosario MRN: [id number] DOB: 1945/12/14 Today's Date: 10/01/2019    History of present illness 74 y.o. male with history of prostate cancer, CABG x3, PAF, ischemic cardiomyopathy, hypertension, hyperlipidemia, diabetes and CAD.  Patient was at his primary care physician's office today to further evaluate episodes of losing strength in all extremities.  While he was at the primary care office patient had sudden onset of left gaze deviation and right-sided weakness. 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 Pt went to IR on 6/23 for revacularization.   OT comments  Pt seen in conjunction with PT to maximize participation and tolerance. Pt able to complete community distance functional mobility with MIN A as pt continues to be very unsteady with mobility. Pt continues to present with balance impairments, cognitive deficits and aphasia impacting pts ability to complete BADLs. Pt able to complete dynamic balance challenges standing at mirror with min guard assist. Pt able to engage in tic-tac-toe game and able to copy name writing with L hand. Pt noted to state "you" when singing happy birthday. Pt appears to enjoy music and stated "remember" to the tune of songs. Agree with DC plan below with follow acutely per POC.   Follow Up Recommendations  SNF    Equipment Recommendations  3 in 1 bedside commode    Recommendations for Other Services      Precautions / Restrictions Precautions Precautions: Fall Restrictions Weight Bearing Restrictions: No       Mobility Bed Mobility Overal bed mobility: Needs Assistance Bed Mobility: Supine to Sit     Supine to sit: Supervision     General bed mobility comments: supervision for safety due to impulsivity and impaired balance  Transfers Overall transfer level: Needs assistance Equipment used: 1 person hand held assist Transfers: Sit to/from Stand Sit to Stand: Min guard;Min  assist         General transfer comment: min guard- MIN A for safety and balance upon initial stand    Balance Overall balance assessment: Needs assistance Sitting-balance support: No upper extremity supported;Feet supported Sitting balance-Leahy Scale: Fair     Standing balance support: Single extremity supported;Bilateral upper extremity supported Standing balance-Leahy Scale: Poor Standing balance comment: intermittent MIN A for dynamic tasks                           ADL either performed or assessed with clinical judgement   ADL Overall ADL's : Needs assistance/impaired     Grooming: Brushing hair;Sitting;Supervision/safety Grooming Details (indicate cue type and reason): visual cues to initiate                 Toilet Transfer: Minimal assistance;+2 for safety/equipment;Ambulation Toilet Transfer Details (indicate cue type and reason): simulated via functional mobility with +2 for assist with no AD         Functional mobility during ADLs: Minimal assistance;Cueing for sequencing;Cueing for safety General ADL Comments: pt continues to present with cognitive deficits, and aphasis impacting pts ability to engage in BADLs, pt able to complete community distance functional mobility and able to complete standing dynamic balance acitivties. pt engage in tic tac toe game while standing in front of mirror with min guard for balance. pt able to copy name and engage in game appropriately.     Vision Patient Visual Report: Other (comment) (difficult to assess d/t cogntive deficits) Vision Assessment?: Vision impaired- to be further tested in  functional context   Perception     Praxis      Cognition Arousal/Alertness: Awake/alert Behavior During Therapy: Impulsive Overall Cognitive Status: Difficult to assess                                 General Comments: pt able to follow commands with multmodal cues. continues to constantly state "  remember" but did say "you" when singing happy birthday. pt appears to like music and will state "remeber" to the tune of songs. pt trying to word find lyrics to songs throuhgout session        Exercises     Shoulder Instructions       General Comments pt seems to like 60 & 70s music trying to word find but noted to state "remember" to the tune of the song. pt appropriately able to play tic tac toe. Pt able to copy name    Pertinent Vitals/ Pain       Pain Assessment: Faces Faces Pain Scale: No hurt  Home Living                                          Prior Functioning/Environment              Frequency  Min 2X/week        Progress Toward Goals  OT Goals(current goals can now be found in the care plan section)  Progress towards OT goals: Progressing toward goals  Acute Rehab OT Goals Patient Stated Goal: pt unable to state, PT goal to improve mobility and reduce falls risk OT Goal Formulation: Patient unable to participate in goal setting Time For Goal Achievement: 10/10/19 Potential to Achieve Goals: Good  Plan Discharge plan remains appropriate;Frequency remains appropriate    Co-evaluation    PT/OT/SLP Co-Evaluation/Treatment: Yes Reason for Co-Treatment: For patient/therapist safety;To address functional/ADL transfers;Necessary to address cognition/behavior during functional activity   OT goals addressed during session: ADL's and self-care      AM-PAC OT "6 Clicks" Daily Activity     Outcome Measure   Help from another person eating meals?: A Lot Help from another person taking care of personal grooming?: A Little Help from another person toileting, which includes using toliet, bedpan, or urinal?: A Little Help from another person bathing (including washing, rinsing, drying)?: A Little Help from another person to put on and taking off regular upper body clothing?: A Little Help from another person to put on and taking off regular  lower body clothing?: A Little 6 Click Score: 17    End of Session Equipment Utilized During Treatment: Gait belt  OT Visit Diagnosis: Other abnormalities of gait and mobility (R26.89);Unsteadiness on feet (R26.81);Muscle weakness (generalized) (M62.81);Feeding difficulties (R63.3);Other symptoms and signs involving cognitive function;Hemiplegia and hemiparesis Hemiplegia - Right/Left: Right Hemiplegia - dominant/non-dominant: Dominant   Activity Tolerance Patient tolerated treatment well   Patient Left in chair;with call bell/phone within reach;with chair alarm set   Nurse Communication Mobility status;Other (comment) (likes music)        Time: [phone] OT Time Calculation (min): 37 min  Charges: OT General Charges $OT Visit: 1 Visit OT Treatments $Self Care/Home Management : 8-22 mins  Lanier Clam., COTA/L Acute Rehabilitation Services [phone]    [name] 10/01/2019, 1:20 PM

## 2019-10-01 NOTE — H&P (View-Only) (Signed)
STROKE TEAM PROGRESS NOTE   INTERVAL HISTORY   Pt is sitting in chair, awake alert, not agitated, however, still has global aphasia, and only says "remember". Off restrain, but still has urinary retention, has been I&O more than 3 times now. Will need foley. Pending TEE.    Vitals:   09/30/19 2109 09/30/19 2300 10/01/19 0300 10/01/19 0730  BP: (!) 153/63 (!) 142/69 (!) 153/54 (!) 173/84  Pulse: 69 (!) 55 60 71  Resp: 18 19 18 16   Temp: 97.6 F (36.4 C) 97.6 F (36.4 C) (!) 97 F (36.1 C) 98.2 F (36.8 C)  TempSrc: Oral Oral Oral Oral  SpO2: 100% 98% 98% 99%  Weight:      Height:        CBC:  Recent Labs  Lab 09/24/19 1046 09/24/19 1052 09/25/19 0444 09/28/19 0308 09/30/19 0326 10/01/19 0452  WBC 10.4   < > 10.6*   < > 7.8 7.8  NEUTROABS 8.1*  --  8.2*  --   --   --   HGB 11.9*   < > 10.8*   < > 10.3* 10.4*  HCT 38.5*   < > 33.8*   < > 31.7* 32.6*  MCV 93.2   < > 92.9   < > 91.6 90.8  PLT 405*   < > 387   < > 367 368   < > = values in this interval not displayed.    Basic Metabolic Panel:  Recent Labs  Lab 09/28/19 0308 09/29/19 0319 09/30/19 0326 10/01/19 0452  NA 137   < > 140 140  K 3.7   < > 3.6 3.9  CL 104   < > 108 108  CO2 25   < > 24 25  GLUCOSE 141*   < > 128* 126*  BUN 11   < > 12 12  CREATININE 0.99   < > 0.82 0.93  CALCIUM 8.8*   < > 9.0 9.0  MG 1.7  --   --   --   PHOS 3.5  --   --   --    < > = values in this interval not displayed.   Lipid Panel:     Component Value Date/Time   CHOL 118 09/25/2019 0444   TRIG 209 (H) 09/25/2019 0444   HDL 38 (L) 09/25/2019 0444   CHOLHDL 3.1 09/25/2019 0444   VLDL 42 (H) 09/25/2019 0444   LDLCALC 38 09/25/2019 0444   HgbA1c:  Lab Results  Component Value Date   HGBA1C 6.2 (H) 09/25/2019   Urine Drug Screen:     Component Value Date/Time   LABOPIA NONE DETECTED 09/24/2019 1715   COCAINSCRNUR NONE DETECTED 09/24/2019 1715   LABBENZ POSITIVE (A) 09/24/2019 1715   AMPHETMU NONE DETECTED  09/24/2019 1715   THCU NONE DETECTED 09/24/2019 1715   LABBARB NONE DETECTED 09/24/2019 1715    Alcohol Level     Component Value Date/Time   ETH <10 09/24/2019 1046    IMAGING past 24 hours No results found.  PHYSICAL EXAM   Temp:  [97 F (36.1 C)-98.2 F (36.8 C)] 98.2 F (36.8 C) (06/30 0730) Pulse Rate:  [55-78] 71 (06/30 0730) Resp:  [16-19] 16 (06/30 0730) BP: (142-173)/(54-84) 173/84 (06/30 0730) SpO2:  [98 %-100 %] 99 % (06/30 0730)  General - Well nourished, well developed, in no apparent distress.  Ophthalmologic - fundi not visualized due to noncooperation.  Cardiovascular - Regular rhythm and rate.  Neuro - awake alert,  eyes open, global aphasia, not following commands, nonverbal except "remember" for everything.  Not able to name or repeat.  Eyes attending to both eyes, no gaze palsy, blinking to visual threat bilaterally.  PERRL, right mild facial droop.  Bilateral upper extremity and lower extremity symmetrical movement except right hand grip strength decreased and right pronator drift.  DTR 1+ and no Babinski.  Sensation, coordination and gait not tested.  ASSESSMENT/PLAN Gerald Rosario is a 74 y.o. male with history of  prostate cancer, CABG x3, PAF, ischemic cardiomyopathy, hypertension, hyperlipidemia, diabetes and CAD presenting from PCP with transient L gaze deviation and R sided weakness. Upon arrival to ED pt again with sudden onset L eye deviation, aphasia, R sided weakness and R facial droop. He received IV tPA 09/24/2019 at 1102, CTA showed L ICA and L M1 occlusion and pt was taken for IR.  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 Extensive B cavernous ICA atherosclerosis. L M1 occlusion w/ poor  collaterals. Proximal R ICA 40% stenosis. Mild to moderate distal B VA stenoses.   Cerebral angio - intra and extracranial L ICA occlusion and L MCA, L ACA occlusions - TICI3 revascularization    MRI  Small L cerebral (posterior L MCA/watershed) infarct. 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 No source of embolus. Sluggish LV flow but no thrombus seen. LA moderate dilated. 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   TEE pending   If TEE not revealing, recommend 30 day cardiac event monitoring (pt had loop 2015-2018 no afib)  LDL 38  HgbA1c 6.2  SCDs for VTE prophylaxis  aspirin 81 mg daily prior to admission, now on aspirin 325 mg daily and clopidogrel 75 mg daily x 3 months then plavix alone    Therapy recommendations:  SNF  Disposition:  pending    Daughter estranged from pt. Requests any decisions be made by pt's cousin, Gerald Rosario. However, daughter has not provided cousin's number for [place] to contact  Anticipate medically ready for d/c later this afternoon after TEE vs tomorrow if needed.  Arrythmia  Hx paroxsymal Atrial Fibrillation post CABG  Had loop for syncope - inserted 08/20/2013 removed 10/11/2016 at EOL - no arrhythmias noted per Gerald Rosario's note prior to explant  5-beat VT 6/29, asymptomatic  Given current stroke, may consider 30 day cardiac event monitoring as outpt.   Hypertension  Home meds:  Lasix 20, metoprolol 50 bid  Off cleviprex  Prn labetolol    BP on the high end . Resumed home meds  . Add amlodipine 10 . Long-term BP goal normotensive  Hyperlipidemia  Home meds:  lipitor 40, fish oil and red rice yeast  Resumed home statin and fish oil in hospital  LDL 38, goal < 70  Continue statin at discharge  Diabetes type II Controlled  Home meds:  Glipizide 10 bid  HgbA1c 6.2, goal < 7.0  SSI 0-20->0-6  CBGs  Hypoglycemia episode 6/26 -> received orange juice and D50 25cc  Glipizide d/c'ed  PCP follow  up  Dysphagia . Secondary to stroke . MBSS cleared for D2 with nectar thick liquids . IVF @ 40 . Speech on board   Leukocytosis  WBC  10.6->13.4->7.7->7.8->7.8   afebrile  UA 6/23 WBC > 50  UA repeat >50 WBC  UCx >100k enterococcus faecalis   Rocephin 6/27>>6/29  Amoxicillin 500 q8h 6/29>> (7/6 end date)  Urinary retention  Hx of prostate cancer  foley d/c'ed in the setting of UTI  Bladder scan Q6  In and out cath x 3 now  Will need foley catheter - continue on d/c   Agitation 6/28  Trying to leave hospital   Soft restrain -> now off  Ativan x 2 (6/28)  low dose seroquel bid -> sleepy ->change to Qhs  Doing better  Other Stroke Risk Factors  Advanced age  Former Cigarette smoker, advised to stop smoking  ETOH use, alcohol level <10, advised to drink no more than 2 drink(s) a day  Family hx - father died of MI at age 20  Coronary artery disease s/p CABG x 3  Chronic systolic congestive heart failure  Ischemic cardiomyopathy    Other Active Problems  Prostate cancer   Anemia - Hgb - 11.9->10.8->10.2->9.8->10.3->10.4   Hospital day # 7   Rosalin Hawking, MD PhD Stroke Neurology 10/01/2019 10:34 AM  To contact Stroke Continuity provider, please refer to [email or web address] After hours, contact General Neurology

## 2019-10-01 NOTE — Progress Notes (Signed)
  Echocardiogram Echocardiogram Transesophageal has been performed.  Bobbye Charleston 10/01/2019, 3:34 PM

## 2019-10-01 NOTE — Anesthesia Postprocedure Evaluation (Signed)
Anesthesia Post Note  Patient: Gerald Rosario  Procedure(s) Performed: TRANSESOPHAGEAL ECHOCARDIOGRAM (TEE) (N/A ) BUBBLE STUDY     Patient location during evaluation: Endoscopy Anesthesia Type: MAC Level of consciousness: awake and alert and patient cooperative Pain management: pain level controlled Vital Signs Assessment: post-procedure vital signs reviewed and stable Respiratory status: spontaneous breathing, nonlabored ventilation and respiratory function stable Cardiovascular status: blood pressure returned to baseline and stable Postop Assessment: no apparent nausea or vomiting Anesthetic complications: no   No complications documented.  Last Vitals:  Vitals:   10/01/19 1531 10/01/19 1606  BP: (!) 174/64 (!) 149/75  Pulse: 81 82  Resp: (!) 21 18  Temp:  36.9 C  SpO2: 93% 96%    Last Pain:  Vitals:   10/01/19 1606  TempSrc: Oral  PainSc: 0-No pain                 Elai Vanwyk,E. Zimal Weisensel

## 2019-10-01 NOTE — Progress Notes (Signed)
STROKE TEAM PROGRESS NOTE   INTERVAL HISTORY   Pt is sitting in chair, awake alert, not agitated, however, still has global aphasia, and only says "remember". Off restrain, but still has urinary retention, has been I&O more than 3 times now. Will need foley. Pending TEE.    Vitals:   09/30/19 2109 09/30/19 2300 10/01/19 0300 10/01/19 0730  BP: (!) 153/63 (!) 142/69 (!) 153/54 (!) 173/84  Pulse: 69 (!) 55 60 71  Resp: 18 19 18 16   Temp: 97.6 F (36.4 C) 97.6 F (36.4 C) (!) 97 F (36.1 C) 98.2 F (36.8 C)  TempSrc: Oral Oral Oral Oral  SpO2: 100% 98% 98% 99%  Weight:      Height:        CBC:  Recent Labs  Lab 09/24/19 1046 09/24/19 1052 09/25/19 0444 09/28/19 0308 09/30/19 0326 10/01/19 0452  WBC 10.4   < > 10.6*   < > 7.8 7.8  NEUTROABS 8.1*  --  8.2*  --   --   --   HGB 11.9*   < > 10.8*   < > 10.3* 10.4*  HCT 38.5*   < > 33.8*   < > 31.7* 32.6*  MCV 93.2   < > 92.9   < > 91.6 90.8  PLT 405*   < > 387   < > 367 368   < > = values in this interval not displayed.    Basic Metabolic Panel:  Recent Labs  Lab 09/28/19 0308 09/29/19 0319 09/30/19 0326 10/01/19 0452  NA 137   < > 140 140  K 3.7   < > 3.6 3.9  CL 104   < > 108 108  CO2 25   < > 24 25  GLUCOSE 141*   < > 128* 126*  BUN 11   < > 12 12  CREATININE 0.99   < > 0.82 0.93  CALCIUM 8.8*   < > 9.0 9.0  MG 1.7  --   --   --   PHOS 3.5  --   --   --    < > = values in this interval not displayed.   Lipid Panel:     Component Value Date/Time   CHOL 118 09/25/2019 0444   TRIG 209 (H) 09/25/2019 0444   HDL 38 (L) 09/25/2019 0444   CHOLHDL 3.1 09/25/2019 0444   VLDL 42 (H) 09/25/2019 0444   LDLCALC 38 09/25/2019 0444   HgbA1c:  Lab Results  Component Value Date   HGBA1C 6.2 (H) 09/25/2019   Urine Drug Screen:     Component Value Date/Time   LABOPIA NONE DETECTED 09/24/2019 1715   COCAINSCRNUR NONE DETECTED 09/24/2019 1715   LABBENZ POSITIVE (A) 09/24/2019 1715   AMPHETMU NONE DETECTED  09/24/2019 1715   THCU NONE DETECTED 09/24/2019 1715   LABBARB NONE DETECTED 09/24/2019 1715    Alcohol Level     Component Value Date/Time   ETH <10 09/24/2019 1046    IMAGING past 24 hours No results found.  PHYSICAL EXAM   Temp:  [97 F (36.1 C)-98.2 F (36.8 C)] 98.2 F (36.8 C) (06/30 0730) Pulse Rate:  [55-78] 71 (06/30 0730) Resp:  [16-19] 16 (06/30 0730) BP: (142-173)/(54-84) 173/84 (06/30 0730) SpO2:  [98 %-100 %] 99 % (06/30 0730)  General - Well nourished, well developed, in no apparent distress.  Ophthalmologic - fundi not visualized due to noncooperation.  Cardiovascular - Regular rhythm and rate.  Neuro - awake alert,  eyes open, global aphasia, not following commands, nonverbal except "remember" for everything.  Not able to name or repeat.  Eyes attending to both eyes, no gaze palsy, blinking to visual threat bilaterally.  PERRL, right mild facial droop.  Bilateral upper extremity and lower extremity symmetrical movement except right hand grip strength decreased and right pronator drift.  DTR 1+ and no Babinski.  Sensation, coordination and gait not tested.  ASSESSMENT/PLAN Mr. DEVAN DANZER is a 74 y.o. male with history of  prostate cancer, CABG x3, PAF, ischemic cardiomyopathy, hypertension, hyperlipidemia, diabetes and CAD presenting from PCP with transient L gaze deviation and R sided weakness. Upon arrival to ED pt again with sudden onset L eye deviation, aphasia, R sided weakness and R facial droop. He received IV tPA 09/24/2019 at 1102, CTA showed L ICA and L M1 occlusion and pt was taken for IR.  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 Extensive B cavernous ICA atherosclerosis. L M1 occlusion w/ poor  collaterals. Proximal R ICA 40% stenosis. Mild to moderate distal B VA stenoses.   Cerebral angio - intra and extracranial L ICA occlusion and L MCA, L ACA occlusions - TICI3 revascularization    MRI  Small L cerebral (posterior L MCA/watershed) infarct. 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 No source of embolus. Sluggish LV flow but no thrombus seen. LA moderate dilated. 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   TEE pending   If TEE not revealing, recommend 30 day cardiac event monitoring (pt had loop 2015-2018 no afib)  LDL 38  HgbA1c 6.2  SCDs for VTE prophylaxis  aspirin 81 mg daily prior to admission, now on aspirin 325 mg daily and clopidogrel 75 mg daily x 3 months then plavix alone    Therapy recommendations:  SNF  Disposition:  pending    Daughter estranged from pt. Requests any decisions be made by pt's cousin, Gatha Mayer. However, daughter has not provided cousin's number for [place] to contact  Anticipate medically ready for d/c later this afternoon after TEE vs tomorrow if needed.  Arrythmia  Hx paroxsymal Atrial Fibrillation post CABG  Had loop for syncope - inserted 08/20/2013 removed 10/11/2016 at EOL - no arrhythmias noted per [facility]'s note prior to explant  5-beat VT 6/29, asymptomatic  Given current stroke, may consider 30 day cardiac event monitoring as outpt.   Hypertension  Home meds:  Lasix 20, metoprolol 50 bid  Off cleviprex  Prn labetolol    BP on the high end . Resumed home meds  . Add amlodipine 10 . Long-term BP goal normotensive  Hyperlipidemia  Home meds:  lipitor 40, fish oil and red rice yeast  Resumed home statin and fish oil in hospital  LDL 38, goal < 70  Continue statin at discharge  Diabetes type II Controlled  Home meds:  Glipizide 10 bid  HgbA1c 6.2, goal < 7.0  SSI 0-20->0-6  CBGs  Hypoglycemia episode 6/26 -> received orange juice and D50 25cc  Glipizide d/c'ed  PCP follow  up  Dysphagia . Secondary to stroke . MBSS cleared for D2 with nectar thick liquids . IVF @ 40 . Speech on board   Leukocytosis  WBC  10.6->13.4->7.7->7.8->7.8   afebrile  UA 6/23 WBC > 50  UA repeat >50 WBC  UCx >100k enterococcus faecalis   Rocephin 6/27>>6/29  Amoxicillin 500 q8h 6/29>> (7/6 end date)  Urinary retention  Hx of prostate cancer  foley d/c'ed in the setting of UTI  Bladder scan Q6  In and out cath x 3 now  Will need foley catheter - continue on d/c   Agitation 6/28  Trying to leave hospital   Soft restrain -> now off  Ativan x 2 (6/28)  low dose seroquel bid -> sleepy ->change to Qhs  Doing better  Other Stroke Risk Factors  Advanced age  Former Cigarette smoker, advised to stop smoking  ETOH use, alcohol level <10, advised to drink no more than 2 drink(s) a day  Family hx - father died of MI at age 43  Coronary artery disease s/p CABG x 3  Chronic systolic congestive heart failure  Ischemic cardiomyopathy    Other Active Problems  Prostate cancer   Anemia - Hgb - 11.9->10.8->10.2->9.8->10.3->10.4   Hospital day # 7   Rosalin Hawking, MD PhD Stroke Neurology 10/01/2019 10:34 AM  To contact Stroke Continuity provider, please refer to [email or web address] After hours, contact General Neurology

## 2019-10-01 NOTE — Progress Notes (Signed)
Asked to go to patient's room due to attorney's present at the bedside. Notified the "attorneys" that they are not a designated visitor. The male attorney stated that they are designated visitors and allowed to be there because they have been hired to retain power of attorney by the patient's friend. Notified them that the patient is not able to sign any paperwork, they requested my full name and address and this writer refused to give this info. Notified them that I would be calling security so they left.

## 2019-10-02 ENCOUNTER — Encounter (HOSPITAL_COMMUNITY): Payer: Self-pay | Admitting: Internal Medicine

## 2019-10-02 DIAGNOSIS — N179 Acute kidney failure, unspecified: Secondary | ICD-10-CM | POA: Diagnosis not present

## 2019-10-02 DIAGNOSIS — R451 Restlessness and agitation: Secondary | ICD-10-CM | POA: Diagnosis not present

## 2019-10-02 DIAGNOSIS — N390 Urinary tract infection, site not specified: Secondary | ICD-10-CM | POA: Diagnosis not present

## 2019-10-02 DIAGNOSIS — I358 Other nonrheumatic aortic valve disorders: Secondary | ICD-10-CM | POA: Diagnosis present

## 2019-10-02 DIAGNOSIS — I69391 Dysphagia following cerebral infarction: Secondary | ICD-10-CM

## 2019-10-02 DIAGNOSIS — D151 Benign neoplasm of heart: Secondary | ICD-10-CM

## 2019-10-02 LAB — CBC
HCT: 31.5 % — ABNORMAL LOW (ref 39.0–52.0)
Hemoglobin: 10.1 g/dL — ABNORMAL LOW (ref 13.0–17.0)
MCH: 29.1 pg (ref 26.0–34.0)
MCHC: 32.1 g/dL (ref 30.0–36.0)
MCV: 90.8 fL (ref 80.0–100.0)
Platelets: 386 10*3/uL (ref 150–400)
RBC: 3.47 MIL/uL — ABNORMAL LOW (ref 4.22–5.81)
RDW: 13.2 % (ref 11.5–15.5)
WBC: 7.2 10*3/uL (ref 4.0–10.5)
nRBC: 0 % (ref 0.0–0.2)

## 2019-10-02 LAB — BASIC METABOLIC PANEL
Anion gap: 8 (ref 5–15)
BUN: 17 mg/dL (ref 8–23)
CO2: 25 mmol/L (ref 22–32)
Calcium: 9.1 mg/dL (ref 8.9–10.3)
Chloride: 107 mmol/L (ref 98–111)
Creatinine, Ser: 1.34 mg/dL — ABNORMAL HIGH (ref 0.61–1.24)
GFR calc Af Amer: >60 mL/min (ref >60)
GFR calc non Af Amer: 52 mL/min — ABNORMAL LOW (ref >60)
Glucose, Bld: 155 mg/dL — ABNORMAL HIGH (ref 70–99)
Potassium: 3.8 mmol/L (ref 3.5–5.1)
Sodium: 140 mmol/L (ref 135–145)

## 2019-10-02 LAB — GLUCOSE, CAPILLARY
Glucose-Capillary: 133 mg/dL — ABNORMAL HIGH (ref 70–99)
Glucose-Capillary: 208 mg/dL — ABNORMAL HIGH (ref 70–99)
Glucose-Capillary: 160 mg/dL — ABNORMAL HIGH (ref 70–99)
Glucose-Capillary: 189 mg/dL — ABNORMAL HIGH (ref 70–99)

## 2019-10-02 MED ORDER — APIXABAN 5 MG PO TABS
5.0000 mg | ORAL_TABLET | Freq: Two times a day (BID) | ORAL | Status: DC
  Administered 2019-10-02 – 2019-10-07 (×10): 5 mg via ORAL
  Filled 2019-10-02 (×10): qty 1

## 2019-10-02 NOTE — Discharge Summary (Addendum)
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 The tricuspid valve is normal in structure. Tricuspid valve regurgitation is mild. Aortic Valve: Mobile echodensity attached to norcoronary cusp of aortic valve on aortic side of valve measuring 0.7 x 0.5 cm. Given presentation with CVA, recommend TEE for further evaluation. The aortic valve is tricuspid. Aortic valve regurgitation is not visualized. Mild to moderate aortic valve  sclerosis/calcification is present, without any evidence of aortic stenosis. Pulmonic Valve: The pulmonic valve was grossly normal. Pulmonic valve regurgitation is trivial. Aorta: The aortic root and ascending aorta are structurally normal, with no evidence of dilitation. Venous: The inferior vena cava is dilated in size with less than 50% respiratory variability, suggesting right atrial pressure of 15 mmHg. IAS/Shunts: The interatrial septum was not well visualized.  LEFT VENTRICLE PLAX 2D LVIDd:         5.70 cm  Diastology LVIDs:         3.80 cm  LV e' lateral:   8.92 cm/s LV PW:  1.10 cm  LV E/e' lateral: 10.3 LV IVS:        0.80 cm  LV e' medial:    7.83 cm/s LVOT diam:     2.00 cm  LV E/e' medial:  11.7 LV SV:         68 LV SV Index:   38 LVOT Area:     3.14 cm  RIGHT VENTRICLE RV S prime:     11.60 cm/s TAPSE (M-mode): 1.8 cm LEFT ATRIUM             Index       RIGHT ATRIUM           Index LA diam:        5.10 cm 2.86 cm/m  RA Area:     18.70 cm LA Vol (A2C):   63.2 ml 35.49 ml/m RA Volume:   49.90 ml  28.02 ml/m LA Vol (A4C):   82.1 ml 46.11 ml/m LA Biplane Vol: 76.3 ml 42.85 ml/m  AORTIC VALVE LVOT Vmax:   98.60 cm/s LVOT Vmean:  67.600 cm/s LVOT VTI:    0.218 m  AORTA Ao Root diam: 3.30 cm Ao Asc diam:  2.80 cm MITRAL VALVE               TRICUSPID VALVE MV Area (PHT): 4.93 cm    TR Peak grad:   32.7 mmHg MV Decel Time: 154 msec    TR Vmax:        286.00 cm/s MV E velocity: 91.70 cm/s MV A velocity: 44.60 cm/s  SHUNTS MV E/A ratio:  2.06        Systemic VTI:  0.22 m                            Systemic Diam: 2.00 cm Oswaldo Milian MD Electronically signed by Oswaldo Milian MD Signature Date/Time: 09/25/2019/12:57:55 PM    Final    ECHO TEE  Result Date: 10/02/2019    TRANSESOPHOGEAL ECHO REPORT   Patient Name:   Gerald Rosario [facility] Date of Exam: 10/01/2019 Medical Rec #:  [id number]     Height:       68.0 in Accession #:    [id number]    Weight:       151.9 lb Date of Birth:  [birth year]      BSA:          1.818 m Patient Age:    25 years      BP:           173/84 mmHg Patient Gender: M             HR:           71 bpm. Exam Location:  Inpatient Procedure: 3D Echo, Cardiac Doppler, Color Doppler, Saline Contrast Bubble Study            and Transesophageal Echo Indications:     Stroke. Aortic valve abnormality.  History:         Patient has prior history of Echocardiogram examinations, most                  recent 09/25/2019. CHF, CAD and Previous Myocardial Infarction,                  Prior CABG, Stroke, Aortic Valve Disease,                  Signs/Symptoms:Syncope; Risk Factors:Diabetes and Hypertension.  Mobile echodensity on aortic valve.  Sonographer:     Roseanna Rainbow RDCS Referring Phys:  [id number] HAO MENG Diagnosing Phys: Cherlynn Kaiser MD PROCEDURE: After discussion of the risks and benefits of a TEE, an informed consent was obtained from the patient. TEE procedure time was 32 minutes. The transesophogeal probe was passed without difficulty through the esophogus of the patient. Imaged were obtained with the patient in a left lateral decubitus position. Local oropharyngeal anesthetic was provided with Cetacaine. Sedation performed by different physician. The patient was monitored while under deep sedation. Anesthestetic sedation was provided intravenously by Anesthesiology: 290mg  of Propofol, 30mg  of Lidocaine. Image quality was good. The patient's vital signs; including heart rate, blood pressure, and oxygen saturation; remained stable throughout the procedure. The patient developed no complications during the procedure. IMPRESSIONS  1. Mobile echodensity 0.9 x 0.3 cm on the ventricular aspect of the aortic valve with independent motion. 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 Unclear attachment point. Cannot exclude infective endocarditis. Consider blood cultures if not already performed. The aortic valve is tricuspid. Aortic valve  regurgitation is mild. Mild to moderate aortic valve sclerosis/calcification is present, without any evidence of aortic stenosis.  2. Left ventricular ejection fraction, by estimation, is 50 to 55%. The left ventricle has low normal function.  3. Right ventricular systolic function is normal. The right ventricular size is normal.  4. Left atrial appendage surgically clipped, no residual appendage, no thrombus. Left atrial size was moderately dilated.  5. The mitral valve is degenerative. Mild mitral valve regurgitation.  6. There is Severe, mobile (Grade V) atheroma plaque involving the transverse and descending aorta.  7. Agitated saline contrast bubble study was negative, with no evidence of any interatrial shunt. FINDINGS  Left Ventricle: Left ventricular ejection fraction, by estimation, is 50 to 55%. The left ventricle has low normal function. The left ventricular internal cavity size was normal in size. Right Ventricle: The right ventricular size is normal. No increase in right ventricular wall thickness. Right ventricular systolic function is normal. Left Atrium: Left atrial appendage surgically clipped, no residual appendage, no thrombus. Left atrial size was moderately dilated. Right Atrium: Right atrial size was normal in size. Pericardium: There is no evidence of pericardial effusion. Mitral Valve: The mitral valve is degenerative in appearance. Mild mitral valve regurgitation. Tricuspid Valve: The tricuspid valve is normal in structure. Tricuspid valve regurgitation is trivial. Aortic Valve: Mobile echodensity 0.9 x 0.3 cm on the ventricular aspect of the aortic valve with independent motion. 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 Unclear attachment point. Cannot exclude infective endocarditis. Consider blood cultures if not already performed. The aortic valve is tricuspid. Aortic valve regurgitation is mild. Mild to moderate aortic valve sclerosis/calcification  is present, without any evidence of aortic stenosis. There is moderate calcification of the aortic valve. Aortic valve mean gradient measures 4.0 mmHg. Aortic valve peak gradient measures 6.9 mmHg. Pulmonic Valve: The pulmonic valve was normal in structure. Pulmonic valve regurgitation is trivial. Aorta: The aortic root and ascending aorta are structurally normal, with no evidence of dilitation. There is severe, mobile (Grade V) atheroma plaque involving the transverse and descending aorta. IAS/Shunts: No atrial level shunt detected by color flow Doppler. Agitated saline contrast was given intravenously to evaluate for intracardiac shunting. Agitated saline contrast bubble study was negative, with no evidence of any interatrial shunt.  AORTIC VALVE AV Vmax:           131.00 cm/s AV Vmean:  96.600 cm/s AV VTI:            0.217 m AV Peak Grad:      6.9 mmHg AV Mean Grad:      4.0 mmHg LVOT Vmax:         73.90 cm/s LVOT Vmean:        53.100 cm/s LVOT VTI:          0.135 m LVOT/AV VTI ratio: 0.62  SHUNTS Systemic VTI: 0.14 m Cherlynn Kaiser MD Electronically signed by Cherlynn Kaiser MD Signature Date/Time: 10/02/2019/6:06:03 AM    Final    IR PERCUTANEOUS ART THROMBECTOMY/INFUSION INTRACRANIAL INC DIAG ANGIO  Result Date: 09/26/2019 INDICATION: New onset of aphasia, left gaze deviation and right-sided hemiplegia. 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 EXAM: 1. EMERGENT LARGE VESSEL OCCLUSION THROMBOLYSIS (anterior CIRCULATION) COMPARISON:  CT angiogram of the head and neck of 09/24/2019. MEDICATIONS: Ancef 2 g IV antibiotic was administered within 1 hour of the procedure. ANESTHESIA/SEDATION: General anesthesia CONTRAST:  Isovue 300 approximately 95 cc FLUOROSCOPY TIME:  Fluoroscopy Time: 40 minutes 18 seconds (1708 mGy). COMPLICATIONS: None immediate.  TECHNIQUE: Following a full explanation of the procedure along with the potential associated complications, an informed witnessed consent was obtained from the patient's daughter. The risks of intracranial hemorrhage of 10%, worsening neurological deficit, ventilator dependency, death and inability to revascularize were all reviewed in detail with the patient's daughter. The patient was then put under general anesthesia by the Department of Anesthesiology at [facility]. The right groin was prepped and draped in the usual sterile fashion. Thereafter using modified Seldinger technique, transfemoral access into the right common femoral artery was obtained without difficulty. Over a 0.035 inch guidewire a 8 French 25 cm Pinnacle sheath was inserted. Through this, and also over a 0.035 inch guidewire a 5 French Bernstein 125 cm inside of an [place] balloon guide catheter combination was advanced to the left common carotid artery. The guidewire and the support catheter were then removed. Good aspiration was obtained from the balloon guide just proximal to the left internal carotid artery. FINDINGS: A gentle control arteriogram performed through this again demonstrated continued occlusion of the left internal carotid artery in its entirety with no distal reconstitution intracranially from the external carotid artery branches. PROCEDURE: Over a 0.014 inch standard Synchro micro guidewire with a J configuration, an 021 microcatheter inside of a 135 cm 071 Zoom aspiration catheter was advanced without difficulty to the cavernous segment of the right internal carotid artery. The micro guidewire was then gently manipulated without difficulty into the middle cerebral artery inferior division M2 M3 region followed by the microcatheter. The Zoom 071 aspiration catheter was then advanced into the left middle cerebral artery and positioned in the distal M1 segment with complete occlusion to aspiration confirming its  position within the clot. A 4 mm x 40 mm Solitaire X retrieval device was advanced to the distal end of the microcatheter and deployed in the usual manner by retrieving the microcatheter which was now repositioned more proximally. The balloon guide was then advanced to the distal cervical left ICA. With proximal flow arrest, and after having aspirated with a Penumbra aspiration device at the hub of the 071 Zoom catheter, and with a 60 mL syringe at the hub of the balloon guide, the combination of the 021 microcatheter, the retrieval device, and  the 071 Zoom catheter were retrieved and removed. Proximal flow arrest was reversed. A control arteriogram performed through the balloon guide in the left internal carotid artery demonstrated now complete recanalization of the left internal carotid artery, left anterior cerebral artery, and the left middle cerebral artery to the distal M1 segment. Anterior temporal branch was now patent. A second pass was now made again with the above combination with the microcatheter again positioned in the M2 M3 region of the inferior division. Gentle contrast injection through the microcatheter demonstrated safe position of the tip of the microcatheter. This was connected to continuous heparinized saline infusion. At this time, a 5 mm x 37 mm Embotrap retrieval device was advanced and deployed in the inferior division of the left middle cerebral artery into the distal left MCA M1 segment. The 071 Zoom microcatheter was engaged into the clot at the origin of the inferior division for 2-1/2 minutes with constant aspiration being applied with a Penumbra aspiration device with proximal flow arrest. At the end of this, the combination of the retrieval device, the microcatheter, the Zoom 071 aspiration catheter was again retrieved and removed as constant aspiration was applied at the hub of the balloon guide catheter. Following reversal of the flow arrest, a control arteriogram performed  through the left internal carotid artery demonstrated complete angiographic revascularization of the left middle cerebral artery achieving a TICI 3 revascularization. The left anterior cerebral artery and the left posterior communicating artery also demonstrated wide patency. 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 A control arteriogram performed at 5 minutes thereafter again demonstrated continued wide patency of the left middle cerebral artery, the left anterior cerebral artery and the left posterior communicating artery territories. No change was seen in the 50% stenosis at the left internal carotid artery intracranially, and also involving the petrous cavernous junction. The balloon guide was then gently retrieved into the left common carotid artery and a control arteriogram performed demonstrated wide patency of the left internal carotid artery proximally and distally. The balloon guide catheter was removed. The 8 French Pinnacle sheath was then removed with hemostasis in the right groin puncture site with an 8 French Angio-Seal closure device. The right groin appeared soft without evidence of hematoma. Distal pulses remained Dopplerable in the dorsalis pedis, and the posterior tibial regions bilaterally unchanged. Throughout the procedure, the patient's blood pressure and neurological status remained stable. A CT of the brain performed on the table demonstrated no evidence of hemorrhage or mass effect or midline shift. The patient was then extubated without difficulty. Upon recovery, the patient moved his left-side spontaneously. He was also able to bend his right knee. However, there was no movement seen in the right upper extremity. Patient was then transferred to the neuro ICU for post thrombectomy management. IMPRESSION: Status post endovascular complete revascularization of occluded left internal carotid artery  intracranially and extracranially, and the left middle cerebral artery and the left anterior cerebral artery proximal A1 segment with 1 pass with a 4 mm x 40 mm Solitaire X retrieval device and Penumbra aspiration, and 1 pass with the 5 mm x 37 mm Embotrap retrieval device and Penumbra aspiration achieving a TICI 3 revascularization. PLAN: Follow-up in the clinic 4 weeks post discharge. Electronically Signed   By: Luanne Bras M.D.   On: 09/25/2019 16:39   CT HEAD CODE STROKE WO CONTRAST  Result Date: 09/24/2019 CLINICAL DATA:  Code stroke. Acute neuro  deficit. Right-sided weakness and aphasia. EXAM: CT HEAD WITHOUT CONTRAST TECHNIQUE: Contiguous axial images were obtained from the base of the skull through the vertex without intravenous contrast. COMPARISON:  CT head 08/18/2013 FINDINGS: Brain: Motion degraded study. Repeat imaging also degraded by motion. Generalized atrophy. No acute hemorrhage. Small chronic infarct over the left convexity. Evaluation for acute infarct not possible due to motion. Vascular: Hyperdense left MCA compatible with acute thrombus. Skull: Negative Sinuses/Orbits: Mucosal edema throughout the paranasal sinuses with air-fluid level left sphenoid sinus. Prior sinus surgery. Bilateral ocular surgery. Other: None ASPECTS ([place] Stroke Program Early CT Score) Not performed due to motion IMPRESSION: 1. Hyperdense left MCA compatible acute thrombus. 2. ASPECTS is not performed due to extensive motion. Negative for acute hemorrhage. 3. Results texted to Dr. Lorraine Lax Electronically Signed   By: Franchot Gallo M.D.   On: 09/24/2019 11:02   CT ANGIO HEAD CODE STROKE  Result Date: 09/24/2019 CLINICAL DATA:  Stroke.  Right-sided weakness and aphasia. EXAM: CT ANGIOGRAPHY HEAD AND NECK TECHNIQUE: Multidetector CT imaging of the head and neck was performed using the standard protocol during bolus administration of intravenous contrast. Multiplanar CT image reconstructions and MIPs were  obtained to evaluate the vascular anatomy. Carotid stenosis measurements (when applicable) are obtained utilizing NASCET criteria, using the distal internal carotid diameter as the denominator. CONTRAST:  67mL OMNIPAQUE IOHEXOL 350 MG/ML SOLN COMPARISON:  CT head 09/24/2019 FINDINGS: CTA NECK FINDINGS Aortic arch: Atherosclerotic calcification aortic arch. Proximal great vessels widely patent. Right carotid system: Atherosclerotic calcification proximal left internal carotid artery. 40% diameter stenosis proximal left internal carotid artery. Left carotid system: Left common carotid artery is widely patent. Left external carotid artery widely patent. 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 This remains occluded through the cervical segment and through the cavernous segment. There is reconstitution of the supraclinoid internal carotid artery on the left. Vertebral arteries: Both vertebral arteries are patent to the basilar with scattered atherosclerotic disease but no flow limiting stenosis. Skeleton: Cervical spondylosis.  No acute skeletal abnormality. Other neck: No soft tissue mass or adenopathy in the neck. Upper chest: Lung apices clear bilaterally. 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 Reconstitution of the supraclinoid internal carotid artery on the left. Left anterior cerebral artery widely patent. Abrupt occlusion of the left M1 segment with poor collateral circulation in the left MCA territory. Diffuse atherosclerotic calcification in the right cavernous carotid with moderate stenosis. Anterior and middle cerebral arteries patent on the right. Posterior circulation: Both vertebral arteries patent to the basilar. Mild to moderate stenosis distal vertebral artery due to calcific stenosis bilaterally. Left PICA patent. Right PICA not visualized. Diffuse  atherosclerotic disease throughout the basilar with mild to moderate stenosis. Superior cerebellar and posterior cerebral arteries patent bilaterally. Moderate stenosis left P2 segment. Venous sinuses: Negative Anatomic variants: None Review of the MIP images confirms the above findings IMPRESSION: 1. 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 Left internal carotid artery is occluded through the supraclinoid segment with reconstitution via collaterals. There is extensive atherosclerotic calcification in the cavernous carotid bilaterally. There is abrupt occlusion left M1 segment compatible with acute thrombus. There is poor collateral circulation left MCA territory. 2. 40% diameter stenosis proximal right internal carotid artery. 3. Mild to moderate stenosis distal vertebral artery bilaterally. 4. These results were called by telephone at the time of interpretation on 09/24/2019 at 11:23 am to  provider Aroor, who verbally acknowledged these results. Electronically Signed   By: Franchot Gallo M.D.   On: 09/24/2019 11:24   CT ANGIO NECK CODE STROKE  Result Date: 09/24/2019 CLINICAL DATA:  Stroke.  Right-sided weakness and aphasia. EXAM: CT ANGIOGRAPHY HEAD AND NECK TECHNIQUE: Multidetector CT imaging of the head and neck was performed using the standard protocol during bolus administration of intravenous contrast. Multiplanar CT image reconstructions and MIPs were obtained to evaluate the vascular anatomy. Carotid stenosis measurements (when applicable) are obtained utilizing NASCET criteria, using the distal internal carotid diameter as the denominator. CONTRAST:  30mL OMNIPAQUE IOHEXOL 350 MG/ML SOLN COMPARISON:  CT head 09/24/2019 FINDINGS: CTA NECK FINDINGS Aortic arch: Atherosclerotic calcification aortic arch. Proximal great vessels widely patent. Right carotid system: Atherosclerotic calcification proximal left internal carotid artery. 40% diameter stenosis proximal left internal carotid artery.  Left carotid system: Left common carotid artery is widely patent. Left external carotid artery widely patent. 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 This remains occluded through the cervical segment and through the cavernous segment. There is reconstitution of the supraclinoid internal carotid artery on the left. Vertebral arteries: Both vertebral arteries are patent to the basilar with scattered atherosclerotic disease but no flow limiting stenosis. Skeleton: Cervical spondylosis.  No acute skeletal abnormality. Other neck: No soft tissue mass or adenopathy in the neck. Upper chest: Lung apices clear bilaterally. 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 Reconstitution of the supraclinoid internal carotid artery on the left. Left anterior cerebral artery widely patent. Abrupt occlusion of the left M1 segment with poor collateral circulation in the left MCA territory. Diffuse atherosclerotic calcification in the right cavernous carotid with moderate stenosis. Anterior and middle cerebral arteries patent on the right. Posterior circulation: Both vertebral arteries patent to the basilar. Mild to moderate stenosis distal vertebral artery due to calcific stenosis bilaterally. Left PICA patent. Right PICA not visualized. Diffuse atherosclerotic disease throughout the basilar with mild to moderate stenosis. Superior cerebellar and posterior cerebral arteries patent bilaterally. Moderate stenosis left P2 segment. Venous sinuses: Negative Anatomic variants: None Review of the MIP images confirms the above findings IMPRESSION: 1. 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 Left internal carotid artery is occluded through the supraclinoid segment with reconstitution via collaterals. There is extensive atherosclerotic calcification in the cavernous carotid bilaterally.  There is abrupt occlusion left M1 segment compatible with acute thrombus. There is poor collateral circulation left MCA territory. 2. 40% diameter stenosis proximal right internal carotid artery. 3. Mild to moderate stenosis distal vertebral artery bilaterally. 4. These results were called by telephone at the time of interpretation on 09/24/2019 at 11:23 am to provider Aroor, who verbally acknowledged these results. Electronically Signed   By: Franchot Gallo M.D.   On: 09/24/2019 11:24      HISTORY OF PRESENT ILLNESS Gerald Rosario is a 74 y.o. male with history of prostate cancer, CABG x3, PAF, ischemic cardiomyopathy, hypertension, hyperlipidemia, diabetes and CAD.  Patient was at his primary care physician's office today to further evaluate episodes of losing strength in all extremities. While he was at the primary care office, patient had sudden onset of left gaze deviation and right-sided weakness.  PCP believe that he might be having a seizure.  EMS was called.  By the time EMS arrived patient was back to baseline.  Upon arriving at emergency department patient again had sudden onset  of left eye deviation, aphasia, right-sided weakness and right facial droop.  During exam patient continued to have these findings.  As last known well was 10:30 AM that day 09/24/2019 he was in the window for TPA administration.  Patient had no contraindications thus TPA was administered.  CTA of head did show a left ICA occlusion along with left M1 occlusion thus patient was immediately brought to the interventional suite. Premorbid modified Rankin scale (mRS): 0.    HOSPITAL COURSE [name] is a 74 y.o. male with history of  prostate cancer, CABG x3, PAF, ischemic cardiomyopathy, hypertension, hyperlipidemia, diabetes and CAD presenting from PCP with transient L gaze deviation and R sided weakness. Upon arrival to ED pt again with sudden onset L eye deviation, aphasia, R sided weakness and R facial droop. He  received IV tPA 09/24/2019 at 1102, CTA showed L ICA and L M1 occlusion and pt was taken for IR.   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 Extensive B cavernous ICA atherosclerosis. L M1 occlusion w/ poor collaterals. Proximal R ICA 40% stenosis. Mild to moderate distal B VA stenoses.  Cerebral angio - intra and extracranial L ICA occlusion and L MCA, L ACA occlusions - TICI3 revascularization   MRI  Small L cerebral (posterior L MCA/watershed) infarct. 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 No source of embolus. Sluggish LV flow but no thrombus seen. LA moderate dilated. 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 Severe aortic atherosclerosis. LDL 38 HgbA1c 6.2 SCDs for VTE prophylaxis aspirin 81 mg daily prior to admission, Pt not candidate for open heart surgery in current situation, discussed with daughter [name] and she agrees with medical treatment. Changed DAPT to eliquis.  Therapy recommendations:  SNF Disposition:  SNF Daughter estranged but legal next of kin; she is assisting care team in making decisions. Code status DNR as requested by daughter.  COVID test 7/1 neg for d/c. COVID test 7/6 neg for d/c.   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 Had loop for syncope - inserted 08/20/2013 removed 10/11/2016 at EOL - no arrhythmias noted per [facility]'s note prior to explant 5-beat VT 6/29, asymptomatic TEE showed AV fibroelastoma 0.9x0.3 cm - likely the cause of stroke Pt not candidate for cardiac surgery at this time Daughter agrees with medical management On eliquis for anticoagulation   Hypertension Home meds:  Lasix 20, metoprolol 50 bid Treated with cleviprex in ICU Resumed home meds  added amlodipine  10 BP stable  Long-term BP goal normotensive   Hyperlipidemia Home meds:  lipitor 40, fish oil and red rice yeast Resumed home statin and fish oil in hospital LDL 38, goal < 70 Continue statin and fish oil at discharge   Diabetes type II Controlled Home meds:  Glipizide 10 bid HgbA1c 6.2, goal < 7.0 Hypoglycemia episode 6/26 -> received orange juice and D50 25cc Glipizide d/c'ed following monitor   Dysphagia Secondary to stroke Upgraded to D3 with nectar thick liquids day of d/c   Urinary Tract Infection Leukocytosis WBC 6.9  afebrile UA 6/23 WBC > 50 UA repeat >50 WBC UCx >100k enterococcus faecalis  Rocephin 6/27>>6/29 Amoxicillin 500 q8h 6/29>>7/6 end date   Urinary retention Hx of prostate cancer foley  d/c'ed in the setting of UTI Bladder scanned Q6 In and out cath x 3 -> foley catheter On flomax D/c with foley - recommend removal in 2 days for voiding trial (abx course completed, on flomax)   Agitation 6/28 Trying to leave hospital  Soft restraint -> now off Ativan x 2 (6/28) low dose seroquel bid -> sleepy ->changed to Qhs Doing better   Other Stroke Risk Factors Advanced age Former Cigarette smoker, advised to stop smoking ETOH use, alcohol level <10, advised to drink no more than 2 drink(s) a day Family hx - father died of MI at age 61 Coronary artery disease s/p CABG x 3 Chronic systolic congestive heart failure Ischemic cardiomyopathy    Other Active Problems Prostate cancer  Fe deficiency + acute blood loss Anemia - Hgb 10.2  AKI Cre 1.07   DISCHARGE EXAM Blood pressure (!) 152/58, pulse 67, temperature 97.6 F (36.4 C), temperature source Oral, resp. rate 16, height 5\' 8"  (1.727 m), weight 68.9 kg, SpO2 99 %. Frail elderly Caucasian male not in distress. . Afebrile. Head is nontraumatic. Neck is supple without bruit.    Cardiac exam no murmur or gallop. Lungs are clear to auscultation. Distal pulses are well felt. Neurological Exam : Awake  alert globally aphasic.  Able to speak a few words and occasional short sentences but perseverates a lot.  Poor naming, repetition and comprehension.  Can follow only occasional midline commands and a few gestures.  Face is symmetric without weakness.  Tongue midline. Moves all 4 extremities well against gravity purposefully.  No focal weakness.  Gait not tested.  Discharge Diet   Dysphagia 3 nectar thick liquids  DISCHARGE PLAN Disposition:  Skilled nursing facility for ongoing PT, OT and ST.  Eliquis (apixaban) daily for secondary stroke prevention given fibroelastoma Ongoing stroke risk factor control by Primary Care Physician at time of discharge Follow-up PCP Binnie Rail, MD in 2 weeks. Follow-up in [facility] in 4 weeks, office to schedule an appointment.  Remove foley in 2 days for voiding trial  40 minutes were spent preparing discharge.  Burnetta Sabin, MSN, APRN, ANVP-BC, AGPCNP-BC Advanced Practice Stroke Nurse [place] for Schedule & Pager information 10/07/2019 12:43 PM   I have personally obtained history,examined this patient, reviewed notes, independently viewed imaging studies, participated in medical decision making and plan of care.ROS completed by me personally and pertinent positives fully documented  I have made any additions or clarifications directly to the above note. Agree with note above.   Antony Contras, MD Medical Director [facility] Pager: [phone] 10/07/2019 2:09 PM

## 2019-10-02 NOTE — Progress Notes (Signed)
STROKE TEAM PROGRESS NOTE   INTERVAL HISTORY   No family at bedside. Pt sitting in bed, still has global aphasia, no neuro changes. TEE yesterday showed AV small fibroelastoma. Pt not candidate for open heart surgery, discussed with daughter [name], will continue medical management. Will change DAPT to [facility] with eliquis.    Vitals:   10/02/19 0007 10/02/19 0352 10/02/19 0746 10/02/19 0808  BP: 131/73 132/61 (!) 150/79   Pulse: 60 (!) 58 81   Resp: 18 18 18    Temp: 98 F (36.7 C) 97.8 F (36.6 C) 98.5 F (36.9 C)   TempSrc: Axillary Oral Oral   SpO2: 100% 99% 100% 100%  Weight:      Height:       CBC:  Recent Labs  Lab 10/01/19 0452 10/02/19 0326  WBC 7.8 7.2  HGB 10.4* 10.1*  HCT 32.6* 31.5*  MCV 90.8 90.8  PLT 368 546   Basic Metabolic Panel:  Recent Labs  Lab 09/28/19 0308 09/29/19 0319 10/01/19 0452 10/02/19 0326  NA 137   < > 140 140  K 3.7   < > 3.9 3.8  CL 104   < > 108 107  CO2 25   < > 25 25  GLUCOSE 141*   < > 126* 155*  BUN 11   < > 12 17  CREATININE 0.99   < > 0.93 1.34*  CALCIUM 8.8*   < > 9.0 9.1  MG 1.7  --   --   --   PHOS 3.5  --   --   --    < > = values in this interval not displayed.   Lipid Panel:     Component Value Date/Time   CHOL 118 09/25/2019 0444   TRIG 209 (H) 09/25/2019 0444   HDL 38 (L) 09/25/2019 0444   CHOLHDL 3.1 09/25/2019 0444   VLDL 42 (H) 09/25/2019 0444   LDLCALC 38 09/25/2019 0444   HgbA1c:  Lab Results  Component Value Date   HGBA1C 6.2 (H) 09/25/2019   Urine Drug Screen:     Component Value Date/Time   LABOPIA NONE DETECTED 09/24/2019 1715   COCAINSCRNUR NONE DETECTED 09/24/2019 1715   LABBENZ POSITIVE (A) 09/24/2019 1715   AMPHETMU NONE DETECTED 09/24/2019 1715   THCU NONE DETECTED 09/24/2019 1715   LABBARB NONE DETECTED 09/24/2019 1715    Alcohol Level     Component Value Date/Time   ETH <10 09/24/2019 1046    IMAGING past 24 hours ECHO TEE  Result Date: 10/02/2019    TRANSESOPHOGEAL ECHO REPORT    Patient Name:   Gerald Rosario [facility] Date of Exam: 10/01/2019 Medical Rec #:  [id number]     Height:       68.0 in Accession #:    [id number]    Weight:       151.9 lb Date of Birth:  75/05/1945     BSA:          1.818 m Patient Age:    74 years      BP:           173/84 mmHg Patient Gender: M             HR:           71 bpm. Exam Location:  Inpatient Procedure: 3D Echo, Cardiac Doppler, Color Doppler, Saline Contrast Bubble Study            and Transesophageal Echo Indications:     Stroke. Aortic  valve abnormality.  History:         Patient has prior history of Echocardiogram examinations, most                  recent 09/25/2019. CHF, CAD and Previous Myocardial Infarction,                  Prior CABG, Stroke, Aortic Valve Disease,                  Signs/Symptoms:Syncope; Risk Factors:Diabetes and Hypertension.                  Mobile echodensity on aortic valve.  Sonographer:     Roseanna Rainbow RDCS Referring Phys:  [id number] HAO MENG Diagnosing Phys: Cherlynn Kaiser MD PROCEDURE: After discussion of the risks and benefits of a TEE, an informed consent was obtained from the patient. TEE procedure time was 32 minutes. The transesophogeal probe was passed without difficulty through the esophogus of the patient. Imaged were obtained with the patient in a left lateral decubitus position. Local oropharyngeal anesthetic was provided with Cetacaine. Sedation performed by different physician. The patient was monitored while under deep sedation. Anesthestetic sedation was provided intravenously by Anesthesiology: 290mg  of Propofol, 30mg  of Lidocaine. Image quality was good. The patient's vital signs; including heart rate, blood pressure, and oxygen saturation; remained stable throughout the procedure. The patient developed no complications during the procedure. IMPRESSIONS  1. Mobile echodensity 0.9 x 0.3 cm on the ventricular aspect of the aortic valve with independent motion. 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 Unclear attachment point. Cannot exclude infective endocarditis. Consider blood cultures if not already performed. The aortic valve is tricuspid. Aortic valve regurgitation is mild. Mild to moderate aortic valve sclerosis/calcification is present, without any evidence of aortic stenosis.  2. Left ventricular ejection fraction, by estimation, is 50 to 55%. The left ventricle has low normal function.  3. Right ventricular systolic function is normal. The right ventricular size is normal.  4. Left atrial appendage surgically clipped, no residual appendage, no thrombus. Left atrial size was moderately dilated.  5. The mitral valve is degenerative. Mild mitral valve regurgitation.  6. There is Severe, mobile (Grade V) atheroma plaque involving the transverse and descending aorta.  7. Agitated saline contrast bubble study was negative, with no evidence of any interatrial shunt. FINDINGS  Left Ventricle: Left ventricular ejection fraction, by estimation, is 50 to 55%. The left ventricle has low normal function. The left ventricular internal cavity size was normal in size. Right Ventricle: The right ventricular size is normal. No increase in right ventricular wall thickness. Right ventricular systolic function is normal. Left Atrium: Left atrial appendage surgically clipped, no residual appendage, no thrombus. Left atrial size was moderately dilated. Right Atrium: Right atrial size was normal in size. Pericardium: There is no evidence of pericardial effusion. Mitral Valve: The mitral valve is degenerative in appearance. Mild mitral valve regurgitation. Tricuspid Valve: The tricuspid valve is normal in structure. Tricuspid valve regurgitation is trivial. Aortic Valve: Mobile echodensity 0.9 x 0.3 cm on the ventricular aspect of the aortic valve with independent motion. 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 Unclear attachment point. Cannot exclude infective  endocarditis. Consider blood cultures if not already performed. The aortic valve is tricuspid. Aortic valve regurgitation is mild. Mild to moderate aortic valve sclerosis/calcification is present, without any evidence of aortic stenosis. There is moderate calcification of the aortic valve. Aortic valve mean gradient measures  4.0 mmHg. Aortic valve peak gradient measures 6.9 mmHg. Pulmonic Valve: The pulmonic valve was normal in structure. Pulmonic valve regurgitation is trivial. Aorta: The aortic root and ascending aorta are structurally normal, with no evidence of dilitation. There is severe, mobile (Grade V) atheroma plaque involving the transverse and descending aorta. IAS/Shunts: No atrial level shunt detected by color flow Doppler. Agitated saline contrast was given intravenously to evaluate for intracardiac shunting. Agitated saline contrast bubble study was negative, with no evidence of any interatrial shunt.  AORTIC VALVE AV Vmax:           131.00 cm/s AV Vmean:          96.600 cm/s AV VTI:            0.217 m AV Peak Grad:      6.9 mmHg AV Mean Grad:      4.0 mmHg LVOT Vmax:         73.90 cm/s LVOT Vmean:        53.100 cm/s LVOT VTI:          0.135 m LVOT/AV VTI ratio: 0.62  SHUNTS Systemic VTI: 0.14 m Cherlynn Kaiser MD Electronically signed by Cherlynn Kaiser MD Signature Date/Time: 10/02/2019/6:06:03 AM    Final     PHYSICAL EXAM   Temp:  [97.8 F (36.6 C)-98.5 F (36.9 C)] 98.5 F (36.9 C) (07/01 0746) Pulse Rate:  [56-82] 81 (07/01 0746) Resp:  [14-21] 18 (07/01 0746) BP: (127-189)/(46-79) 150/79 (07/01 0746) SpO2:  [93 %-100 %] 100 % (07/01 0808) Weight:  [68.9 kg] 68.9 kg (06/30 1330)  General - Well nourished, well developed, in no apparent distress.  Ophthalmologic - fundi not visualized due to noncooperation.  Cardiovascular - Regular rhythm and rate.  Neuro - awake alert, eyes open, global aphasia, not following commands, nonverbal except "remember" for everything.  Not able to  name or repeat.  Eyes attending to both eyes, no gaze palsy, blinking to visual threat bilaterally.  PERRL, right mild facial droop.  Bilateral upper extremity and lower extremity symmetrical movement except right hand grip strength decreased and right pronator drift.  DTR 1+ and no Babinski.  Sensation, coordination and gait not tested.   ASSESSMENT/PLAN Gerald Rosario is a 74 y.o. male with history of  prostate cancer, CABG x3, PAF, ischemic cardiomyopathy, hypertension, hyperlipidemia, diabetes and CAD presenting from PCP with transient L gaze deviation and R sided weakness. Upon arrival to ED pt again with sudden onset L eye deviation, aphasia, R sided weakness and R facial droop. He received IV tPA 09/24/2019 at 1102, CTA showed L ICA and L M1 occlusion and pt was taken for IR.  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 Extensive B cavernous ICA atherosclerosis. L M1 occlusion w/ poor collaterals. Proximal R ICA 40% stenosis. Mild to moderate distal B VA stenoses.   Cerebral angio - intra and extracranial L ICA occlusion and L MCA, L ACA occlusions - TICI3 revascularization    MRI  Small L cerebral (posterior L MCA/watershed) infarct. 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 No source of embolus. Sluggish LV flow but no thrombus seen. LA moderate dilated. 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 Severe aortic atherosclerosis.  LDL 38  HgbA1c 6.2  SCDs for VTE prophylaxis  aspirin 81 mg daily prior to admission, now on aspirin 325 mg daily and clopidogrel 75 mg daily. Pt not candidate for open heart surgery in current situation, discussed with daughter [name] and she agrees with medical treatment. Will switch DAPT to eliquis.   Therapy  recommendations:  SNF  Disposition:  pending    Daughter estranged but legal next of kin; she is assisting care team in making decisions. Code status DNR as requested by daughter.   Medically ready for d/c   Arrythmia Aortic valve fibroelastoma  Hx paroxsymal Atrial Fibrillation post CABG  Had loop for syncope - inserted 08/20/2013 removed 10/11/2016 at EOL - no arrhythmias noted per [facility]'s note prior to explant  5-beat VT 6/29, asymptomatic  TEE showed AV fibroelastoma 0.9x0.3 cm - likely the cause of stroke  Pt not candidate for cardiac surgery at this time  Daughter agrees with medical management  Will start eliquis for anticoagulation   Hypertension  Home meds:  Lasix 20, metoprolol 50 bid  Off cleviprex  Prn labetolol    BP stable 130-150 . Resumed home meds  . Add amlodipine 10 . Long-term BP goal normotensive  Hyperlipidemia  Home meds:  lipitor 40, fish oil and red rice yeast  Resumed home statin and fish oil in hospital  LDL 38, goal < 70  Continue statin at discharge  Diabetes type II Controlled  Home meds:  Glipizide 10 bid  HgbA1c 6.2, goal < 7.0  SSI 0-20->0-6  CBGs  Hypoglycemia episode 6/26 -> received orange juice and D50 25cc  Glipizide d/c'ed  PCP follow up  Dysphagia . Secondary to stroke . MBSS cleared for D2 with nectar thick liquids . IVF @ 40 . Speech on board   Leukocytosis  WBC 10.6->13.4->7.7->7.8->7.8->7.2  afebrile  UA 6/23 WBC > 50  UA repeat >50 WBC  UCx >100k enterococcus faecalis   Rocephin 6/27>>6/29  Amoxicillin 500 q8h 6/29>> (7/6 end date)  Urinary retention  Hx of prostate cancer  foley d/c'ed in the setting of UTI  Bladder scan Q6  In and out cath x 3 now -> foley catheter  Agitation 6/28  Trying to leave hospital   Soft restrain -> now off  Ativan x 2 (6/28)  low dose seroquel bid -> sleepy ->change to Qhs  Doing better  Other Stroke Risk Factors  Advanced age  Former  Cigarette smoker, advised to stop smoking  ETOH use, alcohol level <10, advised to drink no more than 2 drink(s) a day  Family hx - father died of MI at age 8  Coronary artery disease s/p CABG x 3  Chronic systolic congestive heart failure  Ischemic cardiomyopathy    Other Active Problems  Prostate cancer   Anemia - Hgb - 11.9->10.8->10.2->9.8->10.3->10.4->10.1  AKI Cre 0.82-0.93-1.34 - continue IVF  Hospital day # 8   Rosalin Hawking, MD PhD Stroke Neurology 10/02/2019 10:36 AM  To contact Stroke Continuity provider, please refer to [email or web address] After hours, contact General Neurology

## 2019-10-02 NOTE — Plan of Care (Signed)
  Problem: Nutrition: Goal: Adequate nutrition will be maintained Outcome: Progressing   

## 2019-10-02 NOTE — Progress Notes (Signed)
  Speech Language Pathology Treatment: Dysphagia;Cognitive-Linquistic  Patient Details Name: Gerald Rosario MRN: [id number] DOB: Mar 06, 1946 Today's Date: 10/02/2019 Time: 4166-0630 SLP Time Calculation (min) (ACUTE ONLY): 34 min  Assessment / Plan / Recommendation Clinical Impression  Pt was seen for communication and swallowing therapy. SLP provided trials of nectar thick liquids, allowing pt to drink via cup, with Mod cues needed for impulsivity. Even though he was not as impulsive as he has been, he still has coughing noted after a very brief delay when drinking via cup. This is eliminated once he returns to self-fed spoonfuls, and cueing for rate of intake is reduced to more minimal amounts.   Pt is still primarily limited to the word "remember" although during singing familiar songs, he is able to produce several other words - some with phonemic paraphasias and others that are accurate. While singing "Happy Rudene Anda" he reached for his communication board and promptly pointed to the word July. He then counted out on his fingers until he got to the number 13. He repeated this more than once, making it more like that he was accurately indicating his birthday ([birth year]). He could not identify other months when cued though. Pt also used the letter board to match the letters from the whiteboard in his room, spelling out his name and several other words/numbers with Min cues. He is not yet able to spell words to command. Pt used gestures more throughout this session to help try to convey his message. He shows good progress this session and will benefit from ongoing, intensive speech therapy.    HPI HPI: Pt is a 74 yo male presenting with fluctuating weakness. Upon arrival at the ED pt had a sudden onset pof R weakness, L eye deviation, and aphasia. CT showed a hyperdensity in the L MCA compatible with acute thrombus; s/p TPA and emergent mechanical thrombectomy. PMH includes: vocal cord dysfunction,  hoarseness (onset 11/09; negative w/u 09/2008; L vocal cord paralysis 8-% recovered), CABG x3, prostate cancer, PAF, ischemic cardiomyopathy, HTN, HLD, dyspnea, DM, CKD, CHF, asthma      SLP Plan  Continue with current plan of care       Recommendations  Diet recommendations: Dysphagia 2 (fine chop);Nectar-thick liquid Liquids provided via: Teaspoon Medication Administration: Crushed with puree Supervision: Full supervision/cueing for compensatory strategies Compensations: Slow rate;Small sips/bites Postural Changes and/or Swallow Maneuvers: Seated upright 90 degrees                Oral Care Recommendations: Oral care BID Follow up Recommendations: Inpatient Rehab SLP Visit Diagnosis: Aphasia (R47.01);Dysphagia, oropharyngeal phase (R13.12) Plan: Continue with current plan of care       GO                Osie Bond., M.A. [facility] Pager [phone] Office [phone]  10/02/2019, 10:57 AM

## 2019-10-02 NOTE — CV Procedure (Signed)
INDICATIONS: stroke  PROCEDURE:   Informed consent was obtained prior to the procedure with daughter by phone. The risks, benefits and alternatives for the procedure were discussed and the patient comprehended these risks.  Risks include, but are not limited to, cough, sore throat, vomiting, nausea, somnolence, esophageal and stomach trauma or perforation, bleeding, low blood pressure, aspiration, pneumonia, infection, trauma to the teeth and death.    After a procedural time-out, the oropharynx was anesthetized with 20% benzocaine spray.   During this procedure the patient was administered propofol per anesthesia for sedation.  The patient's heart rate, blood pressure, and oxygen saturationweare monitored continuously during the procedure. The period of conscious sedation was 45 minutes, of which I was present face-to-face 100% of this time.  The transesophageal probe was inserted in the esophagus and stomach without difficulty and multiple views were obtained.  The patient was kept under observation until the patient left the procedure room.  The patient left the procedure room in stable condition.   Agitated microbubble saline contrast was administered.  COMPLICATIONS:    There were no immediate complications.  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   Severe aortic atherosclerosis.  Remainder of findings in formal echo report  RECOMMENDATIONS:     return to hospital room when alert.  Time Spent Directly with the Patient:  60 minutes   Shrita Thien A Margaretann Loveless 10/02/2019, 5:47 AM

## 2019-10-02 NOTE — Progress Notes (Signed)
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 Sulfa Antibiotics Swelling and Other (See Comments)    Possible angioedema, per the patient's daughter (ICU RN)    Patient Measurements: Height: 5\' 8"  (172.7 cm) Weight: 68.9 kg (151 lb 14.4 oz) IBW/kg (Calculated) : 68.4   Vital Signs: Temp: 97.7 F (36.5 C) (07/01 1137) Temp Source: Oral (07/01 1137) BP: 155/71 (07/01 1137) Pulse Rate: 76 (07/01 1137)  Labs: Recent Labs    09/30/19 0326 09/30/19 0326 10/01/19 0452 10/02/19 0326  HGB 10.3*   < > 10.4* 10.1*  HCT 31.7*  --  32.6* 31.5*  PLT 367  --  368 386  CREATININE 0.82  --  0.93 1.34*   < > = values in this interval not displayed.    Estimated Creatinine Clearance: 46.8 mL/min (A) (by C-G formula based on SCr of 1.34 mg/dL (H)).   Medical History: Past Medical History:  Diagnosis Date  . Asthma    start dulera 100 April 12,2011 > better but "knot in throat" so try qvar June 7,2011 > preferred dulera. HFA 90% May 10,2011 > 90% October 17,2011. PFT's June 7,2011 wnl x minimal nonspecific mid flow reduction while on dulera. Changed to advair intermediate strength October 17,2011 due to ins issue  . CAD (coronary artery disease)    a. severe multivessel CAD s/p STEMI (2012) with severe ICM (EF 20-25%) now improved to 55-60%    b. 08/2013 s/p CABG 3 with LIMA-LAD, SVG-RI, SVG-D1  c. 01/2014 NSTEMI  s/p DES to SVG-RI  . Chronic kidney disease   . Chronic systolic heart failure ([place])   . Diabetes mellitus   . Dyspnea   . Enlarged prostate   . Enlarged prostate   . H/O hyperkalemia   . Hearing loss   . HLD (hyperlipidemia)   . Hoarseness 12-20-11   onset 11/09. neg w/u 09/2008. saw Dr. Raelene Bott. 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 Hx of detached retina repair    a. @ [facility]; Right Eye  . Hyperlipidemia   . Hypertension   .  HYPERTENSION   . Ischemic cardiomyopathy    Repeat Cardiac MRI - EF 52%, distal Septal & apical Akinesis (suggest scar), unable to assess viability due to patient movement  . Multiple fractures of ribs of left side   . PAF (paroxysmal atrial fibrillation) ([place])    a. post CABG  . Prostate cancer ([place]) 09/24/2019  . Pulmonary nodule   . PULMONARY NODULE, RIGHT MIDDLE LOBE   . 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 and clipping of LA appendage    a. LIMA-LAD, SVG-RI, SVG-D1  . Syncope   . Syncope and collapse   . 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 Not a candidate for surgery. D/W Dr. Erlinda Hong, Will start apixaban 5mg  BID tonight  Goal of Therapy:  Therapeutic anticoagulation Monitor platelets by anticoagulation protocol: Yes   Plan:  Apixaban 5mg  PO BID Monitor for S/S of bleeding Pharmacy will sign off and follow peripherally  Geralda Baumgardner A. [name], PharmD, BCPS, FNKF Clinical Pharmacist [place] Please utilize Amion for appropriate phone number to reach the unit pharmacist ([place])    10/02/2019,2:09 PM

## 2019-10-02 NOTE — Progress Notes (Signed)
Physical Therapy Treatment Patient Details Name: Gerald Rosario MRN: [id number] DOB: [birth year] Today's Date: 10/02/2019    History of Present Illness 74 y.o. male with history of prostate cancer, CABG x3, PAF, ischemic cardiomyopathy, hypertension, hyperlipidemia, diabetes and CAD.  Patient was at his primary care physician's office today to further evaluate episodes of losing strength in all extremities.  While he was at the primary care office patient had sudden onset of left gaze deviation and right-sided weakness. 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 Pt went to IR on 6/23 for revacularization.    PT Comments    Patient seen for mobility progression. Pt requires min-mod A for OOB mobility due to impaired balance and impulsivity/difficulty following commands. Pt is becoming more efficient with use of gestures to express needs as he continues to only verbalize "remember".  PT will continue to follow acutely and progress as tolerated.    Follow Up Recommendations  CIR     Equipment Recommendations  Other (comment) (TBD pending progress)    Recommendations for Other Services Rehab consult     Precautions / Restrictions Precautions Precautions: Fall Restrictions Weight Bearing Restrictions: No    Mobility  Bed Mobility Overal bed mobility: Needs Assistance             General bed mobility comments: supervision for safety due to impulsivity and impaired standing balance but no physical assistance needed for bed mobility  Transfers Overall transfer level: Needs assistance   Transfers: Sit to/from Stand Sit to Stand: Min guard;Min assist         General transfer comment: min A upon standing for balance; min guard to power up   Ambulation/Gait Ambulation/Gait assistance: Min assist;Mod assist;+2 safety/equipment Gait Distance (Feet):  (100 ft X 2 ) Assistive device:  (assistance at trunk with gait belt ) Gait Pattern/deviations: Step-through  pattern;Decreased stride length;Drifts right/left;Staggering left;Staggering right Gait velocity: decreased   General Gait Details: assistance required for balance/weight shifting; R lateral bias and multiple LOB during session   Stairs             Wheelchair Mobility    Modified Rankin (Stroke Patients Only) Modified Rankin (Stroke Patients Only) Pre-Morbid Rankin Score: No symptoms Modified Rankin: Moderately severe disability     Balance Overall balance assessment: Needs assistance Sitting-balance support: No upper extremity supported;Feet supported Sitting balance-Leahy Scale: Good       Standing balance-Leahy Scale: Poor Standing balance comment: worked on dynamic standing balance activities; reaching outside BOS and overhead                            Cognition Arousal/Alertness: Awake/alert Behavior During Therapy: Restless;Impulsive Overall Cognitive Status: Difficult to assess                                 General Comments: pt able to follow commands inconsistently with multmodal cues; continues to only verbalize "remember" however able to use gestures more efficiently during session to express needs       Exercises      General Comments        Pertinent Vitals/Pain Pain Assessment: Faces Faces Pain Scale: No hurt    Home Living                      Prior Function  PT Goals (current goals can now be found in the care plan section) Progress towards PT goals: Progressing toward goals    Frequency    Min 4X/week      PT Plan Current plan remains appropriate    Co-evaluation              AM-PAC PT "6 Clicks" Mobility   Outcome Measure  Help needed turning from your back to your side while in a flat bed without using bedrails?: A Little Help needed moving from lying on your back to sitting on the side of a flat bed without using bedrails?: A Little Help needed moving to and from a  bed to a chair (including a wheelchair)?: A Little Help needed standing up from a chair using your arms (e.g., wheelchair or bedside chair)?: A Little Help needed to walk in hospital room?: A Little Help needed climbing 3-5 steps with a railing? : A Lot 6 Click Score: 17    End of Session Equipment Utilized During Treatment: Gait belt Activity Tolerance: Patient tolerated treatment well Patient left: with call bell/phone within reach;in chair;with chair alarm set Nurse Communication: Mobility status PT Visit Diagnosis: Other abnormalities of gait and mobility (R26.89);Muscle weakness (generalized) (M62.81);Other symptoms and signs involving the nervous system (R29.898);Hemiplegia and hemiparesis Hemiplegia - Right/Left: Right Hemiplegia - dominant/non-dominant: Dominant Hemiplegia - caused by: Cerebral infarction     Time: 1511-1540 PT Time Calculation (min) (ACUTE ONLY): 29 min  Charges:  $Gait Training: 8-22 mins $Neuromuscular Re-education: 8-22 mins                     [name], PTA Acute Rehabilitation Services Pager: [phone] Office: [phone]     Darliss Cheney 10/02/2019, 5:19 PM

## 2019-10-03 DIAGNOSIS — N179 Acute kidney failure, unspecified: Secondary | ICD-10-CM

## 2019-10-03 DIAGNOSIS — C61 Malignant neoplasm of prostate: Secondary | ICD-10-CM

## 2019-10-03 DIAGNOSIS — I69391 Dysphagia following cerebral infarction: Secondary | ICD-10-CM

## 2019-10-03 DIAGNOSIS — N3000 Acute cystitis without hematuria: Secondary | ICD-10-CM

## 2019-10-03 LAB — GLUCOSE, CAPILLARY
Glucose-Capillary: 137 mg/dL — ABNORMAL HIGH (ref 70–99)
Glucose-Capillary: 229 mg/dL — ABNORMAL HIGH (ref 70–99)
Glucose-Capillary: 150 mg/dL — ABNORMAL HIGH (ref 70–99)
Glucose-Capillary: 189 mg/dL — ABNORMAL HIGH (ref 70–99)

## 2019-10-03 LAB — CBC
HCT: 31.6 % — ABNORMAL LOW (ref 39.0–52.0)
Hemoglobin: 10 g/dL — ABNORMAL LOW (ref 13.0–17.0)
MCH: 28.6 pg (ref 26.0–34.0)
MCHC: 31.6 g/dL (ref 30.0–36.0)
MCV: 90.3 fL (ref 80.0–100.0)
Platelets: 371 10*3/uL (ref 150–400)
RBC: 3.5 MIL/uL — ABNORMAL LOW (ref 4.22–5.81)
RDW: 13.2 % (ref 11.5–15.5)
WBC: 9.7 10*3/uL (ref 4.0–10.5)
nRBC: 0 % (ref 0.0–0.2)

## 2019-10-03 LAB — BASIC METABOLIC PANEL
Anion gap: 8 (ref 5–15)
BUN: 13 mg/dL (ref 8–23)
CO2: 24 mmol/L (ref 22–32)
Calcium: 9.1 mg/dL (ref 8.9–10.3)
Chloride: 105 mmol/L (ref 98–111)
Creatinine, Ser: 1.05 mg/dL (ref 0.61–1.24)
GFR calc Af Amer: >60 mL/min (ref >60)
GFR calc non Af Amer: >60 mL/min (ref >60)
Glucose, Bld: 154 mg/dL — ABNORMAL HIGH (ref 70–99)
Potassium: 3.9 mmol/L (ref 3.5–5.1)
Sodium: 137 mmol/L (ref 135–145)

## 2019-10-03 LAB — SARS CORONAVIRUS 2 (TAT 6-24 HRS): SARS Coronavirus 2: NEGATIVE

## 2019-10-03 NOTE — Progress Notes (Signed)
Physical Therapy Treatment Patient Details Name: Gerald Rosario MRN: [id number] DOB: 1946-01-23 Today's Date: 10/03/2019    History of Present Illness 74 y.o. male with history of prostate cancer, CABG x3, PAF, ischemic cardiomyopathy, hypertension, hyperlipidemia, diabetes and CAD.  Patient was at his primary care physician's office today to further evaluate episodes of losing strength in all extremities.  While he was at the primary care office patient had sudden onset of left gaze deviation and right-sided weakness. 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 Pt went to IR on 6/23 for revacularization.    PT Comments    Patient continues to make progress toward PT goals and with improving communication. PT will continue to follow acutely and recommend post acute rehab for further skilled PT services to maximize independence and safety with mobility.    Follow Up Recommendations  CIR     Equipment Recommendations  Other (comment) (TBD pending progress)    Recommendations for Other Services Rehab consult     Precautions / Restrictions Precautions Precautions: Fall    Mobility  Bed Mobility               General bed mobility comments: pt OOB in chair upon arrival   Transfers Overall transfer level: Needs assistance   Transfers: Sit to/from Stand Sit to Stand: Min guard         General transfer comment: min guard for safety  Ambulation/Gait Ambulation/Gait assistance: Min assist;+2 safety/equipment Gait Distance (Feet): 200 Feet Assistive device:  (assistance at trunk with gait belt ) Gait Pattern/deviations: Step-through pattern;Decreased stride length;Drifts right/left;Staggering left;Staggering right Gait velocity: decreased   General Gait Details: assistance required for balance; pt drifts R and L and increased instability noted with horizontal head turns and turning   Stairs             Wheelchair Mobility    Modified Rankin (Stroke  Patients Only) Modified Rankin (Stroke Patients Only) Pre-Morbid Rankin Score: No symptoms Modified Rankin: Moderately severe disability     Balance Overall balance assessment: Needs assistance Sitting-balance support: No upper extremity supported;Feet supported Sitting balance-Leahy Scale: Good     Standing balance support: No upper extremity supported;During functional activity Standing balance-Leahy Scale: Poor                              Cognition Arousal/Alertness: Awake/alert Behavior During Therapy: Impulsive;WFL for tasks assessed/performed Overall Cognitive Status: Difficult to assess                                 General Comments: pt follows multimodal cues inconsistently; continues to improve communication often using gestures      Exercises      General Comments        Pertinent Vitals/Pain Pain Assessment: Faces Faces Pain Scale: No hurt    Home Living                      Prior Function            PT Goals (current goals can now be found in the care plan section) Progress towards PT goals: Progressing toward goals    Frequency    Min 4X/week      PT Plan Current plan remains appropriate    Co-evaluation  AM-PAC PT "6 Clicks" Mobility   Outcome Measure  Help needed turning from your back to your side while in a flat bed without using bedrails?: None Help needed moving from lying on your back to sitting on the side of a flat bed without using bedrails?: None Help needed moving to and from a bed to a chair (including a wheelchair)?: A Little Help needed standing up from a chair using your arms (e.g., wheelchair or bedside chair)?: A Little Help needed to walk in hospital room?: A Little Help needed climbing 3-5 steps with a railing? : A Little 6 Click Score: 20    End of Session Equipment Utilized During Treatment: Gait belt Activity Tolerance: Patient tolerated treatment  well Patient left: with call bell/phone within reach;in chair;with chair alarm set Nurse Communication: Mobility status PT Visit Diagnosis: Other abnormalities of gait and mobility (R26.89);Muscle weakness (generalized) (M62.81);Other symptoms and signs involving the nervous system (R29.898);Hemiplegia and hemiparesis Hemiplegia - Right/Left: Right Hemiplegia - dominant/non-dominant: Dominant Hemiplegia - caused by: Cerebral infarction     Time: 1430-1446 PT Time Calculation (min) (ACUTE ONLY): 16 min  Charges:  $Gait Training: 8-22 mins                     [name], PTA Acute Rehabilitation Services Pager: [phone] Office: [phone]     Darliss Cheney 10/03/2019, 3:24 PM

## 2019-10-03 NOTE — Plan of Care (Signed)
  Problem: Education: Goal: Knowledge of General Education information will improve Description Including pain rating scale, medication(s)/side effects and non-pharmacologic comfort measures Outcome: Progressing   

## 2019-10-03 NOTE — Progress Notes (Signed)
STROKE TEAM PROGRESS NOTE   INTERVAL HISTORY   No family at bedside. 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 Otherwise no change, pending SNF. Currently tolerating eliquis well.    Vitals:   10/03/19 0000 10/03/19 0338 10/03/19 0808 10/03/19 1241  BP: (!) 158/62 (!) 147/64 (!) 141/59 138/84  Pulse: 64 (!) 59 71 67  Resp: 17 20 16 16   Temp: 97.9 F (36.6 C) (!) 97.5 F (36.4 C) 98.2 F (36.8 C) (!) 97.5 F (36.4 C)  TempSrc: Oral Oral Oral Oral  SpO2: 96% 98% 100% 100%  Weight:      Height:       CBC:  Recent Labs  Lab 10/02/19 0326 10/03/19 0427  WBC 7.2 9.7  HGB 10.1* 10.0*  HCT 31.5* 31.6*  MCV 90.8 90.3  PLT 386 818   Basic Metabolic Panel:  Recent Labs  Lab 09/28/19 0308 09/29/19 0319 10/02/19 0326 10/03/19 0427  NA 137   < > 140 137  K 3.7   < > 3.8 3.9  CL 104   < > 107 105  CO2 25   < > 25 24  GLUCOSE 141*   < > 155* 154*  BUN 11   < > 17 13  CREATININE 0.99   < > 1.34* 1.05  CALCIUM 8.8*   < > 9.1 9.1  MG 1.7  --   --   --   PHOS 3.5  --   --   --    < > = values in this interval not displayed.   Lipid Panel:     Component Value Date/Time   CHOL 118 09/25/2019 0444   TRIG 209 (H) 09/25/2019 0444   HDL 38 (L) 09/25/2019 0444   CHOLHDL 3.1 09/25/2019 0444   VLDL 42 (H) 09/25/2019 0444   LDLCALC 38 09/25/2019 0444   HgbA1c:  Lab Results  Component Value Date   HGBA1C 6.2 (H) 09/25/2019   Urine Drug Screen:     Component Value Date/Time   LABOPIA NONE DETECTED 09/24/2019 1715   COCAINSCRNUR NONE DETECTED 09/24/2019 1715   LABBENZ POSITIVE (A) 09/24/2019 1715   AMPHETMU NONE DETECTED 09/24/2019 1715   THCU NONE DETECTED 09/24/2019 1715   LABBARB NONE DETECTED 09/24/2019 1715    Alcohol Level     Component Value Date/Time   ETH <10 09/24/2019 1046    IMAGING past 24 hours No results found.  PHYSICAL EXAM  Temp:  [97.5 F (36.4 C)-99 F (37.2 C)] 97.5 F  (36.4 C) (07/02 1241) Pulse Rate:  [59-72] 67 (07/02 1241) Resp:  [16-20] 16 (07/02 1241) BP: (133-158)/(59-84) 138/84 (07/02 1241) SpO2:  [96 %-100 %] 100 % (07/02 1241)  General - Well nourished, well developed, in no apparent distress.  Ophthalmologic - fundi not visualized due to noncooperation.  Cardiovascular - Regular rhythm and rate.  Neuro - awake alert, eyes open, global aphasia, not following commands, nonverbal except "remember" for everything.  Not able to name or repeat.  Eyes attending to both eyes, no gaze palsy, blinking to visual threat bilaterally.  PERRL, right mild facial droop.  Bilateral upper extremity and lower extremity symmetrical movement except right hand grip strength decreased and right pronator drift.  DTR 1+ and no Babinski.  Sensation, coordination and gait not tested.   ASSESSMENT/PLAN [name] is a 74 y.o. male with history of  prostate cancer, CABG x3, PAF, ischemic  cardiomyopathy, hypertension, hyperlipidemia, diabetes and CAD presenting from PCP with transient L gaze deviation and R sided weakness. Upon arrival to ED pt again with sudden onset L eye deviation, aphasia, R sided weakness and R facial droop. He received IV tPA 09/24/2019 at 1102, CTA showed L ICA and L M1 occlusion and pt was taken for IR.  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 Extensive B cavernous ICA atherosclerosis. L M1 occlusion w/ poor collaterals. Proximal R ICA 40% stenosis. Mild to moderate distal B VA stenoses.   Cerebral angio - intra and extracranial L ICA occlusion and L MCA, L ACA occlusions - TICI3 revascularization    MRI  Small L cerebral (posterior L MCA/watershed) infarct. 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 No source of embolus. Sluggish LV flow but no thrombus seen. LA moderate dilated.  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 Severe aortic atherosclerosis.  LDL 38  HgbA1c 6.2  SCDs for VTE prophylaxis  aspirin 81 mg daily prior to admission, now on eliquis. Pt not candidate for open heart surgery in current situation, discussed with daughter [name] and she agrees with medical treatment. Switched DAPT to eliquis.   Therapy recommendations:  SNF  Disposition:  pending    Daughter estranged but legal next of kin; she is assisting care team in making decisions. Code status DNR as requested by daughter.   Medically ready for d/c. Awaiting insurance authorization for SNF. 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 Resumed home meds  . Added amlodipine 10 . Long-term BP goal normotensive  Hyperlipidemia  Home meds:  lipitor 40, fish oil and red rice yeast  Resumed home statin and fish oil in hospital  LDL 38, goal < 70  Continue statin at discharge  Diabetes type II Controlled  Home meds:  Glipizide 10 bid  HgbA1c 6.2, goal < 7.0  SSI 0-20->0-6  CBGs  Hypoglycemia episode 6/26 -> received orange juice and D50 25cc  Glipizide d/c'ed  PCP follow up  Dysphagia . Secondary to stroke . MBSS cleared for D2 with nectar thick liquids . IVF @ 40cc . Speech on board   Urinary Tract Infection Leukocytosis, resolved  WBC  10.6->13.4->7.7->7.8->7.8->7.2->9.7  afebrile  UA 6/23 WBC > 50  UA repeat >50 WBC  UCx >100k enterococcus faecalis   Rocephin 6/27>>6/29  Amoxicillin 500 q8h 6/29>> (7/6 end date)  Urinary retention  Hx of prostate cancer  foley d/c'ed in the setting of UTI  Bladder scan Q6  In and out cath x 3 now -> foley catheter  On flomax  Agitation 6/28, now resolved  Trying to leave hospital   Soft restrain -> now off  Ativan x 2 (6/28)  low dose seroquel bid -> sleepy ->change to Qhs  Resolved now  Hx of prostate cancer  As per daughter, [facility] and potentially [facility]  Daughter would like to continue follow up with [facility] for surgical options  Will recommend continued outpt follow up with [facility] after discharge.  Other Stroke Risk Factors  Advanced age  Former Cigarette smoker, advised to stop smoking  ETOH use, alcohol level <10, advised to drink no more than 2 drink(s) a day  Family hx - father died of MI at age 89  Coronary artery disease s/p CABG x 3  Chronic systolic congestive heart failure  Ischemic cardiomyopathy    Other Active Problems  Anemia - Hgb - 11.9->10.8->10.2->9.8->10.3->10.4->10.1->10.0  AKI Cre 0.82->0.93->1.34->1.05 - continue IVF @ [facility] day # 9  Rosalin Hawking, MD PhD Stroke Neurology 10/03/2019 2:22 PM  To contact Stroke Continuity provider, please refer to [email or web address] After hours, contact General Neurology

## 2019-10-03 NOTE — Progress Notes (Signed)
  Speech Language Pathology Treatment: Cognitive-Linquistic  Patient Details Name: Gerald Rosario MRN: [id number] DOB: Oct 18, 1945 Today's Date: 10/03/2019 Time: 0258-5277 SLP Time Calculation (min) (ACUTE ONLY): 40 min  Assessment / Plan / Recommendation Clinical Impression  Pt continues to show daily progress. He is making more attempts at using multimodal communication, including gestures and his communication board. Max written/visual/verbal cues were given to expand expressive language beyond the word "remember." He said "one" x1 while looking at the written numeral one. He also started to perseverate on "fifty" at the end of the session. SLP provided a written number "50" and pt began to repeatedly point between the 5 and the 0 on her communication board while saying "fifty." SLP also played familiar music - pt seems to know and enjoy Burr Medico and Ophelia Shoulder. It was clear that he knew the songs, shaking his finger to the beat and consistently singing the chorus of songs with very little perseveration on "remember." He was definitely getting out the majority of the song lyrics for the choruses, although words were also peppered with frequent phonemic paraphasias and distortions. Will continue to follow. PO trials were not given today but will look to repeat MBS early next week for any potential to advance diet.    HPI HPI: Pt is a 74 yo male presenting with fluctuating weakness. Upon arrival at the ED pt had a sudden onset pof R weakness, L eye deviation, and aphasia. CT showed a hyperdensity in the L MCA compatible with acute thrombus; s/p TPA and emergent mechanical thrombectomy. PMH includes: vocal cord dysfunction, hoarseness (onset 11/09; negative w/u 09/2008; L vocal cord paralysis 8-% recovered), CABG x3, prostate cancer, PAF, ischemic cardiomyopathy, HTN, HLD, dyspnea, DM, CKD, CHF, asthma      SLP Plan  Continue with current plan of care       Recommendations  Diet  recommendations: Dysphagia 2 (fine chop);Nectar-thick liquid Liquids provided via: Teaspoon Medication Administration: Crushed with puree Supervision: Full supervision/cueing for compensatory strategies Compensations: Slow rate;Small sips/bites Postural Changes and/or Swallow Maneuvers: Seated upright 90 degrees                Oral Care Recommendations: Oral care BID Follow up Recommendations: Inpatient Rehab SLP Visit Diagnosis: Aphasia (R47.01) Plan: Continue with current plan of care       GO                Osie Bond., M.A. [place] Acute Rehabilitation Services Pager [phone] Office [phone]  10/03/2019, 2:41 PM

## 2019-10-03 NOTE — Progress Notes (Signed)
Occupational Therapy Treatment Patient Details Name: Gerald Rosario MRN: [id number] DOB: 1945/08/19 Today's Date: 10/03/2019    History of present illness 74 y.o. male with history of prostate cancer, CABG x3, PAF, ischemic cardiomyopathy, hypertension, hyperlipidemia, diabetes and CAD.  Patient was at his primary care physician's office today to further evaluate episodes of losing strength in all extremities.  While he was at the primary care office patient had sudden onset of left gaze deviation and right-sided weakness. 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 Pt went to IR on 6/23 for revacularization.   OT comments  Upon arrival pt awake and supine in bed - agreeable to skilled-OT services. Pt requiring supervision with bed mobility and Min Guard-Min A with ADLs, transfers, and ambulation. Pt performed grooming at sink with min guard for balance requiring verbal cues for sequencing, problem solving, and safety. Initially used R hand with noted decreased coordination and then switched to L hand. Pt trying to use water in sink and swallow. Therapist explained to pt why he could not drink the water from the sink - still attempted to get water but swished/spit using water appropriately. Pt brought to recliner and donned lotion to UB and LB - pt required tactile and verbal cues to attend to R side. Pt demonstrating understanding of precautions and multi-step commands. Pt still only vocalizes "remember" but is using appropriate tone when trying to vocalize. Pt left in recliner with alarm and phone/call bell within reach. Believe SNF recommendations remain appropriate. OT will continue to follow acutely.   Follow Up Recommendations  SNF    Equipment Recommendations  3 in 1 bedside commode       Precautions / Restrictions Precautions Precautions: Fall       Mobility Bed Mobility Overal bed mobility: Needs Assistance Bed Mobility: Supine to Sit     Supine to sit: Supervision      General bed mobility comments: supervision for safety due to impulsivity and impaired standing balance but no physical assistance needed for bed mobility  Transfers Overall transfer level: Needs assistance Equipment used: 1 person hand held assist Transfers: Sit to/from Stand Sit to Stand: Min guard;Min assist         General transfer comment: min A upon standing for balance; min guard to power up     Balance Overall balance assessment: Needs assistance Sitting-balance support: No upper extremity supported;Feet supported Sitting balance-Leahy Scale: Good Sitting balance - Comments: minguard for sitting balance   Standing balance support: During functional activity;Single extremity supported Standing balance-Leahy Scale: Poor Standing balance comment: able to groom at sink in standing with min guard and single UE support                           ADL either performed or assessed with clinical judgement   ADL Overall ADL's : Needs assistance/impaired     Grooming: Oral care;Wash/dry face;Brushing hair;Min guard;Cueing for safety Grooming Details (indicate cue type and reason): cues for safety not to drink water. Min guard for balance and unsteadiness on feet     Lower Body Bathing: Sit to/from stand;Minimal assistance;Cueing for sequencing Lower Body Bathing Details (indicate cue type and reason): able to put lotion on thighs and lower legs with knee and hip flexion. Tactile cues needed to use R hand and lotion R leg due to inattention.  Upper Body Dressing : Minimal assistance;Sitting;Cueing for sequencing Upper Body Dressing Details (indicate cue type and  reason): Min A with cues for attending to R side of body     Toilet Transfer: Minimal assistance;Ambulation;Cueing for safety Toilet Transfer Details (indicate cue type and reason): simulated to recliner         Functional mobility during ADLs: Minimal assistance;Cueing for sequencing;Cueing for safety;Min  guard General ADL Comments: Pt able to complete grooming at sink and while seated with Min A with verbal cues for safety and tactile cues for sequencing and attention to R side of body.               Cognition Arousal/Alertness: Awake/alert Behavior During Therapy: Restless;Agitated Overall Cognitive Status: Difficult to assess Area of Impairment: Following commands;Safety/judgement;Awareness;Problem solving;Attention                   Current Attention Level: Sustained   Following Commands: Follows one step commands inconsistently Safety/Judgement: Decreased awareness of safety;Decreased awareness of deficits Awareness: Emergent Problem Solving: Decreased initiation;Difficulty sequencing;Requires verbal cues;Requires tactile cues General Comments: pt follows commands inconsistently. When pt brushing teeth at sink, specifically told pt he could not drink the water from the faucet and he continued to try and get water. Explained to him why he couldn't have water - he continued to try and get water, but would use it appropriately with just swishing and spitting. Decreased safety awareness and awareness of deficits when performing ADLs              General Comments Pt appropriately using water and seems to understand why he can/can't do things with explanations. Pt gestures and tone appropriate but still only vocalized "remember"    Pertinent Vitals/ Pain       Pain Assessment: Faces Faces Pain Scale: No hurt         Frequency  Min 2X/week        Progress Toward Goals  OT Goals(current goals can now be found in the care plan section)  Progress towards OT goals: Progressing toward goals  Acute Rehab OT Goals Patient Stated Goal: pt unable to state OT Goal Formulation: Patient unable to participate in goal setting Time For Goal Achievement: 10/10/19 Potential to Achieve Goals: Good  Plan Discharge plan remains appropriate;Frequency remains appropriate        AM-PAC OT "6 Clicks" Daily Activity     Outcome Measure   Help from another person eating meals?: A Little Help from another person taking care of personal grooming?: A Little Help from another person toileting, which includes using toliet, bedpan, or urinal?: A Little Help from another person bathing (including washing, rinsing, drying)?: A Little Help from another person to put on and taking off regular upper body clothing?: A Little Help from another person to put on and taking off regular lower body clothing?: A Little 6 Click Score: 18    End of Session Equipment Utilized During Treatment: Gait belt  OT Visit Diagnosis: Other abnormalities of gait and mobility (R26.89);Unsteadiness on feet (R26.81);Muscle weakness (generalized) (M62.81);Feeding difficulties (R63.3);Other symptoms and signs involving cognitive function;Hemiplegia and hemiparesis Hemiplegia - Right/Left: Right Hemiplegia - dominant/non-dominant: Dominant Hemiplegia - caused by: Cerebral infarction   Activity Tolerance Patient tolerated treatment well   Patient Left in chair;with call bell/phone within reach;with chair alarm set   Nurse Communication Mobility status        Time: 8341-9622 OT Time Calculation (min): 21 min  Charges: OT General Charges $OT Visit: 1 Visit OT Treatments $Self Care/Home Management : 8-22 mins  Dalani Mette/OTS   Zebulun Deman  10/03/2019, 12:04 PM

## 2019-10-04 LAB — CBC
HCT: 31.1 % — ABNORMAL LOW (ref 39.0–52.0)
Hemoglobin: 9.9 g/dL — ABNORMAL LOW (ref 13.0–17.0)
MCH: 29.1 pg (ref 26.0–34.0)
MCHC: 31.8 g/dL (ref 30.0–36.0)
MCV: 91.5 fL (ref 80.0–100.0)
Platelets: 396 10*3/uL (ref 150–400)
RBC: 3.4 MIL/uL — ABNORMAL LOW (ref 4.22–5.81)
RDW: 13.2 % (ref 11.5–15.5)
WBC: 7.4 10*3/uL (ref 4.0–10.5)
nRBC: 0 % (ref 0.0–0.2)

## 2019-10-04 LAB — BASIC METABOLIC PANEL
Anion gap: 8 (ref 5–15)
BUN: 12 mg/dL (ref 8–23)
CO2: 25 mmol/L (ref 22–32)
Calcium: 9.2 mg/dL (ref 8.9–10.3)
Chloride: 105 mmol/L (ref 98–111)
Creatinine, Ser: 0.94 mg/dL (ref 0.61–1.24)
GFR calc Af Amer: >60 mL/min (ref >60)
GFR calc non Af Amer: >60 mL/min (ref >60)
Glucose, Bld: 157 mg/dL — ABNORMAL HIGH (ref 70–99)
Potassium: 3.8 mmol/L (ref 3.5–5.1)
Sodium: 138 mmol/L (ref 135–145)

## 2019-10-04 LAB — GLUCOSE, CAPILLARY
Glucose-Capillary: 169 mg/dL — ABNORMAL HIGH (ref 70–99)
Glucose-Capillary: 180 mg/dL — ABNORMAL HIGH (ref 70–99)
Glucose-Capillary: 228 mg/dL — ABNORMAL HIGH (ref 70–99)
Glucose-Capillary: 184 mg/dL — ABNORMAL HIGH (ref 70–99)

## 2019-10-04 NOTE — Progress Notes (Signed)
STROKE TEAM PROGRESS NOTE   INTERVAL HISTORY   No major changes.  Awaiting rehab.    Vitals:   10/04/19 0452 10/04/19 0752 10/04/19 0807 10/04/19 1125  BP: (!) 148/57 (!) 143/63  (!) 151/75  Pulse: 69 70  (!) 57  Resp: 17 16  16   Temp: (!) 97.5 F (36.4 C) 98.6 F (37 C)  97.9 F (36.6 C)  TempSrc: Oral Oral  Oral  SpO2: 100% 100% 100% 100%  Weight:      Height:       CBC:  Recent Labs  Lab 10/03/19 0427 10/04/19 0352  WBC 9.7 7.4  HGB 10.0* 9.9*  HCT 31.6* 31.1*  MCV 90.3 91.5  PLT 371 102   Basic Metabolic Panel:  Recent Labs  Lab 09/28/19 0308 09/29/19 0319 10/03/19 0427 10/04/19 0352  NA 137   < > 137 138  K 3.7   < > 3.9 3.8  CL 104   < > 105 105  CO2 25   < > 24 25  GLUCOSE 141*   < > 154* 157*  BUN 11   < > 13 12  CREATININE 0.99   < > 1.05 0.94  CALCIUM 8.8*   < > 9.1 9.2  MG 1.7  --   --   --   PHOS 3.5  --   --   --    < > = values in this interval not displayed.   Lipid Panel:     Component Value Date/Time   CHOL 118 09/25/2019 0444   TRIG 209 (H) 09/25/2019 0444   HDL 38 (L) 09/25/2019 0444   CHOLHDL 3.1 09/25/2019 0444   VLDL 42 (H) 09/25/2019 0444   LDLCALC 38 09/25/2019 0444   HgbA1c:  Lab Results  Component Value Date   HGBA1C 6.2 (H) 09/25/2019   Urine Drug Screen:     Component Value Date/Time   LABOPIA NONE DETECTED 09/24/2019 1715   COCAINSCRNUR NONE DETECTED 09/24/2019 1715   LABBENZ POSITIVE (A) 09/24/2019 1715   AMPHETMU NONE DETECTED 09/24/2019 1715   THCU NONE DETECTED 09/24/2019 1715   LABBARB NONE DETECTED 09/24/2019 1715    Alcohol Level     Component Value Date/Time   ETH <10 09/24/2019 1046    IMAGING past 24 hours No results found.  PHYSICAL EXAM:  Temp:  [97.5 F (36.4 C)-98.6 F (37 C)] 97.9 F (36.6 C) (07/03 1125) Pulse Rate:  [57-75] 57 (07/03 1125) Resp:  [16-20] 16 (07/03 1125) BP: (124-151)/(56-75) 151/75 (07/03 1125) SpO2:  [97 %-100 %] 100 % (07/03 1125)  Awake, alert. Non-fluent;  perseveration of word "remember" constantly. Comprehension - impaired. Naming and Repetition- impaired. Cannot write a sentence or read a sentence and do what it says.   Face symmetrical. Will not follow commands. Moves all 4 extremities spontaneously well.     ASSESSMENT/PLAN [name] is a 74 y.o. male with history of  prostate cancer, CABG x3, PAF, ischemic cardiomyopathy, hypertension, hyperlipidemia, diabetes and CAD presenting from PCP with transient L gaze deviation and R sided weakness. Upon arrival to ED pt again with sudden onset L eye deviation, aphasia, R sided weakness and R facial droop. He received IV tPA 09/24/2019 at 1102, CTA showed L ICA and L M1 occlusion and pt was taken for IR.  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 Extensive B cavernous ICA atherosclerosis. L M1 occlusion w/ poor collaterals. Proximal R ICA 40% stenosis. Mild to moderate distal B VA stenoses.   Cerebral angio - intra and extracranial L ICA occlusion and L MCA, L ACA occlusions - TICI3 revascularization    MRI  Small L cerebral (posterior L MCA/watershed) infarct. 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 No source of embolus. Sluggish LV flow but no thrombus seen. LA moderate dilated. 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 Severe aortic atherosclerosis.  LDL 38  HgbA1c 6.2  SCDs for VTE prophylaxis  aspirin 81 mg daily prior to admission, now on eliquis. Pt not candidate for open heart surgery in current situation, discussed with daughter [name] and she agrees with medical treatment. Switched DAPT to eliquis.   Therapy recommendations:  SNF  Disposition:  pending    Daughter estranged but legal next of kin; she is assisting care team in  making decisions. Code status DNR as requested by daughter.   Medically ready for d/c. Awaiting insurance authorization for SNF. 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 Resumed home meds  . Added amlodipine 10 . Long-term BP goal normotensive  Hyperlipidemia  Home meds:  lipitor 40, fish oil and red rice yeast  Resumed home statin and fish oil in hospital  LDL 38, goal < 70  Continue statin at discharge  Diabetes type II Controlled  Home meds:  Glipizide 10 bid  HgbA1c 6.2, goal < 7.0  SSI 0-20->0-6  CBGs  Hypoglycemia episode 6/26 -> received orange juice and D50 25cc  Glipizide d/c'ed  PCP follow up  Dysphagia . Secondary to stroke . MBSS cleared for D2 with nectar thick liquids . IVF @ 40cc . Speech on board   Urinary Tract Infection Leukocytosis, resolved  WBC 10.6->13.4->7.7->7.8->7.8->7.2->9.7->7.4  afebrile  UA 6/23 WBC > 50  UA repeat >50 WBC  UCx >100k enterococcus faecalis   Rocephin 6/27>>6/29  Amoxicillin 500 q8h 6/29>> (7/6 end date)  Urinary retention  Hx of prostate cancer  foley d/c'ed in the setting of UTI  Bladder scan Q6  In and out cath x 3 now -> foley catheter  On flomax  Agitation 6/28, now resolved  Trying to leave hospital   Soft restrain -> now off  Ativan x 2 (6/28)  low dose seroquel bid -> sleepy ->change to Qhs  Resolved now  Hx of prostate cancer  As per  daughter, [facility] and potentially [facility]  Daughter would like to continue follow up with [facility] for surgical options  Will recommend continued outpt follow up with [facility] after discharge.  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  The area of infarct is in the left posterior temporal lobe, but he is exhibiting profound global aphasia instead of expected receptive aphasia for unknown reason.  He was deemed not a good surgical candidate for the AV tumor resection.  Continue Eliquis for anticoagulation.  Awaiting rehab placement.    Rogue Jury, MS, MD    To contact Stroke Continuity provider, please refer to [email or web address] After hours, contact General Neurology

## 2019-10-04 NOTE — Plan of Care (Signed)
  Problem: Education: Goal: Knowledge of disease or condition will improve Outcome: Progressing   Problem: Nutrition: Goal: Risk of aspiration will decrease Outcome: Progressing   Problem: Activity: Goal: Risk for activity intolerance will decrease Outcome: Progressing

## 2019-10-05 LAB — BASIC METABOLIC PANEL
Anion gap: 8 (ref 5–15)
BUN: 14 mg/dL (ref 8–23)
CO2: 24 mmol/L (ref 22–32)
Calcium: 9 mg/dL (ref 8.9–10.3)
Chloride: 106 mmol/L (ref 98–111)
Creatinine, Ser: 0.96 mg/dL (ref 0.61–1.24)
GFR calc Af Amer: >60 mL/min (ref >60)
GFR calc non Af Amer: >60 mL/min (ref >60)
Glucose, Bld: 157 mg/dL — ABNORMAL HIGH (ref 70–99)
Potassium: 3.9 mmol/L (ref 3.5–5.1)
Sodium: 138 mmol/L (ref 135–145)

## 2019-10-05 LAB — GLUCOSE, CAPILLARY
Glucose-Capillary: 142 mg/dL — ABNORMAL HIGH (ref 70–99)
Glucose-Capillary: 237 mg/dL — ABNORMAL HIGH (ref 70–99)
Glucose-Capillary: 181 mg/dL — ABNORMAL HIGH (ref 70–99)
Glucose-Capillary: 150 mg/dL — ABNORMAL HIGH (ref 70–99)

## 2019-10-05 LAB — CBC
HCT: 30.8 % — ABNORMAL LOW (ref 39.0–52.0)
Hemoglobin: 9.8 g/dL — ABNORMAL LOW (ref 13.0–17.0)
MCH: 28.9 pg (ref 26.0–34.0)
MCHC: 31.8 g/dL (ref 30.0–36.0)
MCV: 90.9 fL (ref 80.0–100.0)
Platelets: 377 10*3/uL (ref 150–400)
RBC: 3.39 MIL/uL — ABNORMAL LOW (ref 4.22–5.81)
RDW: 13.1 % (ref 11.5–15.5)
WBC: 10.1 10*3/uL (ref 4.0–10.5)
nRBC: 0 % (ref 0.0–0.2)

## 2019-10-05 NOTE — Plan of Care (Signed)
  Problem: Education: Goal: Knowledge of disease or condition will improve Outcome: Progressing Goal: Knowledge of secondary prevention will improve Outcome: Progressing Goal: Knowledge of patient specific risk factors addressed and post discharge goals established will improve Outcome: Progressing   Problem: Coping: Goal: Will verbalize positive feelings about self Outcome: Progressing Goal: Will identify appropriate support needs Outcome: Progressing   Problem: Health Behavior/Discharge Planning: Goal: Ability to manage health-related needs will improve Outcome: Progressing   Problem: Self-Care: Goal: Ability to participate in self-care as condition permits will improve Outcome: Progressing Goal: Verbalization of feelings and concerns over difficulty with self-care will improve Outcome: Progressing Goal: Ability to communicate needs accurately will improve Outcome: Progressing   Problem: Nutrition: Goal: Risk of aspiration will decrease Outcome: Progressing Goal: Dietary intake will improve Outcome: Progressing   Problem: Ischemic Stroke/TIA Tissue Perfusion: Goal: Complications of ischemic stroke/TIA will be minimized Outcome: Progressing   Problem: Education: Goal: Knowledge of General Education information will improve Description: Including pain rating scale, medication(s)/side effects and non-pharmacologic comfort measures Outcome: Progressing   Problem: Health Behavior/Discharge Planning: Goal: Ability to manage health-related needs will improve Outcome: Progressing   Problem: Clinical Measurements: Goal: Ability to maintain clinical measurements within normal limits will improve Outcome: Progressing Goal: Will remain free from infection Outcome: Progressing Goal: Diagnostic test results will improve Outcome: Progressing Goal: Respiratory complications will improve Outcome: Progressing Goal: Cardiovascular complication will be avoided Outcome:  Progressing   Problem: Activity: Goal: Risk for activity intolerance will decrease Outcome: Progressing   Problem: Nutrition: Goal: Adequate nutrition will be maintained Outcome: Progressing   Problem: Coping: Goal: Level of anxiety will decrease Outcome: Progressing   Problem: Elimination: Goal: Will not experience complications related to bowel motility Outcome: Progressing Goal: Will not experience complications related to urinary retention Outcome: Progressing   Problem: Pain Managment: Goal: General experience of comfort will improve Outcome: Progressing   Problem: Safety: Goal: Ability to remain free from injury will improve Outcome: Progressing   Problem: Skin Integrity: Goal: Risk for impaired skin integrity will decrease Outcome: Progressing   Problem: Urinary Elimination: Goal: Signs and symptoms of infection will decrease Outcome: Progressing

## 2019-10-05 NOTE — Plan of Care (Signed)
  Problem: Education: Goal: Knowledge of disease or condition will improve Outcome: Progressing   Problem: Nutrition: Goal: Risk of aspiration will decrease Outcome: Progressing Goal: Dietary intake will improve Outcome: Progressing   

## 2019-10-05 NOTE — Progress Notes (Signed)
STROKE TEAM PROGRESS NOTE   INTERVAL HISTORY   Friend is at bedside. Pt still has global aphasia, Otherwise no change, pending SNF. Currently tolerating eliquis well.    Vitals:   10/05/19 0326 10/05/19 0737 10/05/19 0915 10/05/19 1202  BP: 133/65 134/65  129/64  Pulse: 62 79  60  Resp: 16 18  18   Temp: 97.8 F (36.6 C) 98.6 F (37 C)  98 F (36.7 C)  TempSrc: Oral Oral  Oral  SpO2: 97% 98% 98% 99%  Weight:      Height:       CBC:  Recent Labs  Lab 10/04/19 0352 10/05/19 0101  WBC 7.4 10.1  HGB 9.9* 9.8*  HCT 31.1* 30.8*  MCV 91.5 90.9  PLT 396 580   Basic Metabolic Panel:  Recent Labs  Lab 10/04/19 0352 10/05/19 0101  NA 138 138  K 3.8 3.9  CL 105 106  CO2 25 24  GLUCOSE 157* 157*  BUN 12 14  CREATININE 0.94 0.96  CALCIUM 9.2 9.0   Lipid Panel:     Component Value Date/Time   CHOL 118 09/25/2019 0444   TRIG 209 (H) 09/25/2019 0444   HDL 38 (L) 09/25/2019 0444   CHOLHDL 3.1 09/25/2019 0444   VLDL 42 (H) 09/25/2019 0444   LDLCALC 38 09/25/2019 0444   HgbA1c:  Lab Results  Component Value Date   HGBA1C 6.2 (H) 09/25/2019   Urine Drug Screen:     Component Value Date/Time   LABOPIA NONE DETECTED 09/24/2019 1715   COCAINSCRNUR NONE DETECTED 09/24/2019 1715   LABBENZ POSITIVE (A) 09/24/2019 1715   AMPHETMU NONE DETECTED 09/24/2019 1715   THCU NONE DETECTED 09/24/2019 1715   LABBARB NONE DETECTED 09/24/2019 1715    Alcohol Level     Component Value Date/Time   ETH <10 09/24/2019 1046    IMAGING past 24 hours No results found.  PHYSICAL EXAM  Temp:  [97.8 F (36.6 C)-98.7 F (37.1 C)] 98 F (36.7 C) (07/04 1202) Pulse Rate:  [60-90] 60 (07/04 1202) Resp:  [16-20] 18 (07/04 1202) BP: (127-142)/(64-84) 129/64 (07/04 1202) SpO2:  [97 %-100 %] 99 % (07/04 1202)   Awake, alert. Non-fluent; still perseverating. Comprehension, naming, repetition- severely impaired. He cannot write a sentence or read a sentence. Face symmetrical. Moves  all 4 extremities well.    ASSESSMENT/PLAN Gerald Rosario is a 74 y.o. male with history of  prostate cancer, CABG x3, PAF, ischemic cardiomyopathy, hypertension, hyperlipidemia, diabetes and CAD presenting from PCP with transient L gaze deviation and R sided weakness. Upon arrival to ED pt again with sudden onset L eye deviation, aphasia, R sided weakness and R facial droop. He received IV tPA 09/24/2019 at 1102, CTA showed L ICA and L M1 occlusion and pt was taken for IR.  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 Extensive B cavernous ICA atherosclerosis. L M1 occlusion w/ poor collaterals. Proximal R ICA 40% stenosis. Mild to moderate distal B VA stenoses.   Cerebral angio - intra and extracranial L ICA occlusion and L MCA, L ACA occlusions - TICI3 revascularization    MRI  Small L cerebral (posterior L MCA/watershed) infarct. 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 No source of embolus. Sluggish LV flow but no thrombus seen. LA  moderate dilated. 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 Severe aortic atherosclerosis.  LDL 38  HgbA1c 6.2  SCDs for VTE prophylaxis  aspirin 81 mg daily prior to admission, now on eliquis. Pt not candidate for open heart surgery in current situation, discussed with daughter Gerald Rosario and she agrees with medical treatment. Switched DAPT to eliquis.   Therapy recommendations:  SNF  Disposition:  pending    Daughter estranged but legal next of kin; she is assisting care team in making decisions. Code status DNR as requested by daughter.   Medically ready for d/c. Awaiting insurance authorization for SNF. 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 Resumed home meds  . Added amlodipine 10 . Long-term BP goal normotensive  Hyperlipidemia  Home meds:  lipitor 40, fish oil and red rice yeast  Resumed home statin and fish oil in hospital  LDL 38, goal < 70  Continue statin at discharge  Diabetes type II Controlled  Home meds:  Glipizide 10 bid  HgbA1c 6.2, goal < 7.0  SSI 0-20->0-6  CBGs  Hypoglycemia episode 6/26 -> received orange juice and D50 25cc  Glipizide d/c'ed  PCP follow up  Dysphagia . Secondary to stroke . MBSS cleared for D2 with nectar thick liquids . IVF @ 40cc . Speech on board   Urinary Tract Infection Leukocytosis, resolved  WBC 10.6->13.4->7.7->7.8->7.8->7.2->9.7->10.1  afebrile  UA 6/23 WBC > 50  UA repeat >50 WBC  UCx >100k enterococcus faecalis   Rocephin 6/27>>6/29  Amoxicillin 500 q8h 6/29>> (7/6 end date)  Urinary retention  Hx of prostate cancer  foley d/c'ed in the setting of UTI  Bladder scan Q6  In and out cath x 3 now -> foley catheter  On flomax  Agitation 6/28, now resolved  Trying to leave hospital   Soft restrain -> now off  Ativan x 2 (6/28)  low dose seroquel bid -> sleepy ->change to Qhs  Resolved now  Hx of prostate cancer  As per daughter, [facility] and [facility]  Daughter would like to continue follow up with [facility] for [facility] options  Will recommend continued [facility] follow [facility] with [facility] after discharge.  Other Stroke Risk  Factors  Advanced age  Former Cigarette smoker, advised to stop smoking  ETOH use, alcohol level <10, advised to drink no more than 2 drink(s) a day  Family hx - father died of MI at age 40  Coronary artery disease s/p CABG x 3  Chronic systolic congestive heart failure  Ischemic cardiomyopathy    Other Active Problems  Anemia - Hgb - 11.9->10.8->10.2->9.8->10.3->10.4->10.1->10.0->9.8  AKI Cre 0.82->0.93->1.34->1.05->0.96 - continue IVF @ 40cc    Labs stable  Finishes amoxicillin [facility] day # 11  Rosalin Hawking, MD PhD Stroke Neurology 10/05/2019 2:05 PM     Assessment/Plan:  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  He continues to have severe global aphasia which is not really expected from the final area of infarct.  He does not have clinical features of complex partial seizure related aphasia either.  Continue Eliquis.  Awaiting rehab replacement.   Rogue Jury, MS, MD  To contact Stroke Continuity provider, please refer to [email or web address] After hours, contact General Neurology

## 2019-10-06 LAB — CBC
HCT: 33.4 % — ABNORMAL LOW (ref 39.0–52.0)
Hemoglobin: 10.5 g/dL — ABNORMAL LOW (ref 13.0–17.0)
MCH: 28.7 pg (ref 26.0–34.0)
MCHC: 31.4 g/dL (ref 30.0–36.0)
MCV: 91.3 fL (ref 80.0–100.0)
Platelets: 393 10*3/uL (ref 150–400)
RBC: 3.66 MIL/uL — ABNORMAL LOW (ref 4.22–5.81)
RDW: 13 % (ref 11.5–15.5)
WBC: 7.7 10*3/uL (ref 4.0–10.5)
nRBC: 0 % (ref 0.0–0.2)

## 2019-10-06 LAB — GLUCOSE, CAPILLARY
Glucose-Capillary: 138 mg/dL — ABNORMAL HIGH (ref 70–99)
Glucose-Capillary: 234 mg/dL — ABNORMAL HIGH (ref 70–99)
Glucose-Capillary: 128 mg/dL — ABNORMAL HIGH (ref 70–99)

## 2019-10-06 LAB — BASIC METABOLIC PANEL
Anion gap: 8 (ref 5–15)
BUN: 15 mg/dL (ref 8–23)
CO2: 25 mmol/L (ref 22–32)
Calcium: 9.1 mg/dL (ref 8.9–10.3)
Chloride: 104 mmol/L (ref 98–111)
Creatinine, Ser: 1.05 mg/dL (ref 0.61–1.24)
GFR calc Af Amer: >60 mL/min (ref >60)
GFR calc non Af Amer: >60 mL/min (ref >60)
Glucose, Bld: 154 mg/dL — ABNORMAL HIGH (ref 70–99)
Potassium: 4.2 mmol/L (ref 3.5–5.1)
Sodium: 137 mmol/L (ref 135–145)

## 2019-10-06 NOTE — Progress Notes (Signed)
°  Speech Language Pathology Treatment: Dysphagia;Cognitive-Linquistic  Patient Details Name: Gerald Rosario MRN: [id number] DOB: [birth year] Today's Date: 10/06/2019 Time: 1025-8527 SLP Time Calculation (min) (ACUTE ONLY): 17 min  Assessment / Plan / Recommendation Clinical Impression  Pt was seen for communication and swallowing goals. He drank nectar thick liquids via cup sips instead of spoonfuls with a single delayed cough noted despite impulsive rate of intake. His voice is mildly hoarse and suspect it to be chronically this way. Pt overall is note quite as impulsive and is showing some improving comprehension. Recommend repeating MBS - to be completed on next date at the earliest.   Pt also completed a reading task in which he matched single words to familiar objects in his room. After initial demonstration from SLP, pt completed the remainder of the task with 100% accuracy. He still presents with verbal stereotypies (primarily "remember" but at times using "fifty"). At the end of the session we used familiar songs again to get more verbal output, clearly singing along with the choruses but with perseverative and phonemic errors. He is making steady progress, but will continue to benefit from intensive SLP follow up.   HPI HPI: Pt is a 74 yo male presenting with fluctuating weakness. Upon arrival at the ED pt had a sudden onset pof R weakness, L eye deviation, and aphasia. CT showed a hyperdensity in the L MCA compatible with acute thrombus; s/p TPA and emergent mechanical thrombectomy. PMH includes: vocal cord dysfunction, hoarseness (onset 11/09; negative w/u 09/2008; L vocal cord paralysis 8-% recovered), CABG x3, prostate cancer, PAF, ischemic cardiomyopathy, HTN, HLD, dyspnea, DM, CKD, CHF, asthma      SLP Plan  Continue with current plan of care       Recommendations  Diet recommendations: Dysphagia 2 (fine chop);Nectar-thick liquid Liquids provided via: Teaspoon Medication  Administration: Crushed with puree Supervision: Full supervision/cueing for compensatory strategies Compensations: Slow rate;Small sips/bites Postural Changes and/or Swallow Maneuvers: Seated upright 90 degrees                Oral Care Recommendations: Oral care BID Follow up Recommendations: Skilled Nursing facility SLP Visit Diagnosis: Aphasia (R47.01);Dysphagia, oropharyngeal phase (R13.12) Plan: Continue with current plan of care       GO                Osie Bond., M.A. [facility] Pager [phone] Office [phone]  10/06/2019, 4:04 PM

## 2019-10-06 NOTE — Plan of Care (Signed)
  Problem: Education: Goal: Knowledge of disease or condition will improve Outcome: Progressing Goal: Knowledge of secondary prevention will improve Outcome: Progressing Goal: Knowledge of patient specific risk factors addressed and post discharge goals established will improve Outcome: Progressing   Problem: Coping: Goal: Will verbalize positive feelings about self Outcome: Progressing Goal: Will identify appropriate support needs Outcome: Progressing   Problem: Health Behavior/Discharge Planning: Goal: Ability to manage health-related needs will improve Outcome: Progressing   Problem: Self-Care: Goal: Ability to participate in self-care as condition permits will improve Outcome: Progressing Goal: Verbalization of feelings and concerns over difficulty with self-care will improve Outcome: Progressing Goal: Ability to communicate needs accurately will improve Outcome: Progressing   Problem: Nutrition: Goal: Risk of aspiration will decrease Outcome: Progressing Goal: Dietary intake will improve Outcome: Progressing   Problem: Ischemic Stroke/TIA Tissue Perfusion: Goal: Complications of ischemic stroke/TIA will be minimized Outcome: Progressing   Problem: Education: Goal: Knowledge of General Education information will improve Description: Including pain rating scale, medication(s)/side effects and non-pharmacologic comfort measures Outcome: Progressing   Problem: Health Behavior/Discharge Planning: Goal: Ability to manage health-related needs will improve Outcome: Progressing   Problem: Clinical Measurements: Goal: Ability to maintain clinical measurements within normal limits will improve Outcome: Progressing Goal: Will remain free from infection Outcome: Progressing Goal: Diagnostic test results will improve Outcome: Progressing Goal: Respiratory complications will improve Outcome: Progressing Goal: Cardiovascular complication will be avoided Outcome:  Progressing   Problem: Activity: Goal: Risk for activity intolerance will decrease Outcome: Progressing   Problem: Nutrition: Goal: Adequate nutrition will be maintained Outcome: Progressing   Problem: Coping: Goal: Level of anxiety will decrease Outcome: Progressing   Problem: Elimination: Goal: Will not experience complications related to bowel motility Outcome: Progressing Goal: Will not experience complications related to urinary retention Outcome: Progressing   Problem: Pain Managment: Goal: General experience of comfort will improve Outcome: Progressing   Problem: Safety: Goal: Ability to remain free from injury will improve Outcome: Progressing   Problem: Skin Integrity: Goal: Risk for impaired skin integrity will decrease Outcome: Progressing   Problem: Urinary Elimination: Goal: Signs and symptoms of infection will decrease Outcome: Progressing

## 2019-10-06 NOTE — Progress Notes (Signed)
STROKE TEAM PROGRESS NOTE   INTERVAL HISTORY   He is sitting up in bed.  Remains aphasic and speak only occasional words and short sentences.  Follows some gestures of midline commands.  Vital signs stable.  Neuro exam unchanged.  Await skilled nursing home bed.  Vitals:   10/05/19 2317 10/06/19 0300 10/06/19 0800 10/06/19 0928  BP: (!) 112/48 (!) 135/56 (!) 147/66   Pulse: (!) 52 (!) 57 69   Resp: 18 20 16    Temp: 98 F (36.7 C) 97.8 F (36.6 C) 97.9 F (36.6 C)   TempSrc: Axillary Oral Oral   SpO2: 100% 100% 100% 98%  Weight:      Height:       CBC:  Recent Labs  Lab 10/05/19 0101 10/06/19 0705  WBC 10.1 7.7  HGB 9.8* 10.5*  HCT 30.8* 33.4*  MCV 90.9 91.3  PLT 377 678   Basic Metabolic Panel:  Recent Labs  Lab 10/05/19 0101 10/06/19 0705  NA 138 137  K 3.9 4.2  CL 106 104  CO2 24 25  GLUCOSE 157* 154*  BUN 14 15  CREATININE 0.96 1.05  CALCIUM 9.0 9.1    IMAGING past 24 hours No results found.  PHYSICAL EXAM Frail elderly Caucasian male not in distress. . Afebrile. Head is nontraumatic. Neck is supple without bruit.    Cardiac exam no murmur or gallop. Lungs are clear to auscultation. Distal pulses are well felt. Neurological Exam : Awake alert globally aphasic.  Able to speak a few words and occasional short sentences but perseverates a lot.  Poor naming, repetition and comprehension.  Can follow only occasional midline commands and a few gestures.  Face is symmetric without weakness.  Tongue midline.  Moves all 4 extremities well against gravity purposefully.  No focal weakness.  Gait not tested. ASSESSMENT/PLAN [name] is a 74 y.o. male with history of  prostate cancer, CABG x3, PAF, ischemic cardiomyopathy, hypertension, hyperlipidemia, diabetes and CAD presenting from PCP with transient L gaze deviation and R sided weakness. Upon arrival to ED pt again with sudden onset L eye deviation, aphasia, R sided weakness and R facial droop. He received IV  tPA 09/24/2019 at 1102, CTA showed L ICA and L M1 occlusion and pt was taken for IR.  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 Extensive B cavernous ICA atherosclerosis. L M1 occlusion w/ poor collaterals. Proximal R ICA 40% stenosis. Mild to moderate distal B VA stenoses.   Cerebral angio - intra and extracranial L ICA occlusion and L MCA, L ACA occlusions - TICI3 revascularization    MRI  Small L cerebral (posterior L MCA/watershed) infarct. 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 No source of embolus. Sluggish LV flow but no thrombus seen. LA moderate dilated. 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 Severe aortic atherosclerosis.  LDL 38  HgbA1c 6.2  SCDs for VTE prophylaxis  aspirin 81 mg daily prior to admission, now on eliquis. Pt not candidate for open heart surgery in current situation, discussed with daughter Gerald Rosario and she agrees with medical treatment. Switched DAPT to eliquis.   Therapy recommendations:  SNF  Disposition:  Pending insurance approval  Daughter estranged but legal next of  kin; she is assisting care team in making decisions. Code status DNR as requested by daughter.   Medically ready for d/c. Awaiting insurance authorization for SNF. Covid test for d/c 7/1 neg  Anticipate d/c Tues.   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 Resumed home meds  . On amlodipine 10 . BP stable  . Long-term BP goal normotensive  Hyperlipidemia  Home meds:  lipitor 40, fish oil and red rice yeast  Resumed home statin and fish oil in hospital  LDL 38, goal < 70  Continue statin and fish oil at discharge  Diabetes type II Controlled  Home meds:  Glipizide 10 bid  HgbA1c 6.2, goal < 7.0  SSI 0-20->0-6  CBGs  Hypoglycemia episode 6/26 -> received orange juice and D50 25cc  Glipizide d/c'ed  PCP follow up  Dysphagia . Secondary to stroke . MBSS cleared for D2 with nectar thick liquids . IVF @ 40cc . Speech on board   Urinary Tract Infection Leukocytosis, resolved  WBC 10.6->13.4->7.7->7.8->7.8->7.2->9.7->10.1->7.7  afebrile  UA 6/23 WBC > 50  UA repeat >50 WBC  UCx >100k enterococcus faecalis   Rocephin 6/27>>6/29  Amoxicillin 500 q8h 6/29>> (7/6 end date)  Urinary retention  Hx of prostate cancer  foley d/c'ed in the setting of UTI  Bladder scan Q6  In and out cath x 3 now -> foley catheter  On flomax  Agitation 6/28, now resolved  Trying to leave hospital   Soft restrain -> now off  Ativan x 2 (6/28)  low dose seroquel bid -> sleepy ->change to Qhs  Resolved now  Hx of prostate cancer  As per daughter, [facility] and potentially [facility]  Daughter would like to continue follow up with [facility] for surgical options  Will recommend continued [facility]pt follow [facility] with [facility] after discharge.  Other Stroke Risk Factors  Advanced age  Former Cigarette smoker, advised to stop smoking  ETOH use, alcohol level <10, advised to drink no more than 2 drink(s) a day  Family hx - father died of MI at age 37  Coronary artery disease s/p CABG x 3  Chronic systolic congestive heart failure  Ischemic cardiomyopathy    Other Active Problems  Anemia - Hgb -  11.9->10.8->10.2->9.8->10.3->10.4->10.1->10.0->9.8->10.5  AKI Cre 0.82->0.93->1.34->1.05->0.96->1.05 - continue IVF @ [facility] day # 12 Patient has presented with embolic stroke, unclear source.  He does have fibroblastoma of tissue I spoken to the patient's family who prefer medical therapy with Eliquis rather than open surgical procedure to remove it.  Continue ongoing management.  Await skilled nursing home bed for rehabilitation.  Greater than 50% time during this 25-minute visit was spent on counseling and coordination of care and discussion with care team. Antony Contras, MD To contact Stroke Continuity provider, please refer to [email or web address] After hours, contact General Neurology

## 2019-10-06 NOTE — Progress Notes (Signed)
Physical Therapy Treatment Patient Details Name: Gerald Rosario MRN: [id number] DOB: 25-Aug-1945 Today's Date: 10/06/2019    History of Present Illness 74 y.o. male with history of prostate cancer, CABG x3, PAF, ischemic cardiomyopathy, hypertension, hyperlipidemia, diabetes and CAD.  Patient was at his primary care physician's office today to further evaluate episodes of losing strength in all extremities.  While he was at the primary care office patient had sudden onset of left gaze deviation and right-sided weakness. 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 Pt went to IR on 6/23 for revacularization.    PT Comments    Pt received in bed, waving at therapist upon entry and coming to sit EOB eager to walk. He required supervision bed mobility, min guard assist transfers and min assist ambulation 300' without AD. Pt responds well to music and likes to listen to music when he walks. Pt in recliner at end of session.   Follow Up Recommendations  SNF     Equipment Recommendations  None recommended by PT    Recommendations for Other Services       Precautions / Restrictions Precautions Precautions: Fall    Mobility  Bed Mobility Overal bed mobility: Needs Assistance Bed Mobility: Supine to Sit     Supine to sit: Supervision;HOB elevated     General bed mobility comments: supervision for safety, +rail  Transfers Overall transfer level: Needs assistance Equipment used: None Transfers: Sit to/from Stand Sit to Stand: Min guard         General transfer comment: min guard for safety  Ambulation/Gait Ambulation/Gait assistance: Min assist Gait Distance (Feet): 300 Feet Assistive device: None Gait Pattern/deviations: Step-through pattern;Decreased stride length;Drifts right/left;Narrow base of support Gait velocity: decreased Gait velocity interpretation: 1.31 - 2.62 ft/sec, indicative of limited community ambulator General Gait Details: min assist at trunk  using gait belt, unsteady gait   Stairs             Wheelchair Mobility    Modified Rankin (Stroke Patients Only) Modified Rankin (Stroke Patients Only) Pre-Morbid Rankin Score: No symptoms Modified Rankin: Moderately severe disability     Balance Overall balance assessment: Needs assistance Sitting-balance support: No upper extremity supported;Feet supported Sitting balance-Leahy Scale: Good Sitting balance - Comments: supervision   Standing balance support: No upper extremity supported;During functional activity Standing balance-Leahy Scale: Fair Standing balance comment: min assist, no AD                            Cognition Arousal/Alertness: Awake/alert Behavior During Therapy: Impulsive;WFL for tasks assessed/performed Overall Cognitive Status: Difficult to assess Area of Impairment: Problem solving;Safety/judgement                         Safety/Judgement: Decreased awareness of safety   Problem Solving: Difficulty sequencing        Exercises      General Comments        Pertinent Vitals/Pain Pain Assessment: Faces Faces Pain Scale: No hurt    Home Living                      Prior Function            PT Goals (current goals can now be found in the care plan section) Acute Rehab PT Goals Patient Stated Goal: unable to state Progress towards PT goals: Progressing toward goals    Frequency  Min 3X/week      PT Plan Discharge plan needs to be updated;Frequency needs to be updated;Equipment recommendations need to be updated    Co-evaluation              AM-PAC PT "6 Clicks" Mobility   Outcome Measure  Help needed turning from your back to your side while in a flat bed without using bedrails?: None Help needed moving from lying on your back to sitting on the side of a flat bed without using bedrails?: None Help needed moving to and from a bed to a chair (including a wheelchair)?: A Little Help  needed standing up from a chair using your arms (e.g., wheelchair or bedside chair)?: A Little Help needed to walk in hospital room?: A Little Help needed climbing 3-5 steps with a railing? : A Little 6 Click Score: 20    End of Session Equipment Utilized During Treatment: Gait belt Activity Tolerance: Patient tolerated treatment well Patient left: with call bell/phone within reach;in chair;with chair alarm set Nurse Communication: Mobility status PT Visit Diagnosis: Other abnormalities of gait and mobility (R26.89);Muscle weakness (generalized) (M62.81);Other symptoms and signs involving the nervous system (R29.898);Hemiplegia and hemiparesis Hemiplegia - Right/Left: Right Hemiplegia - dominant/non-dominant: Dominant Hemiplegia - caused by: Cerebral infarction     Time: 9758-8325 PT Time Calculation (min) (ACUTE ONLY): 16 min  Charges:  $Gait Training: 8-22 mins                     Lorrin Goodell, PT  Office # [phone] Pager [phone]    Lorriane Shire 10/06/2019, 10:12 AM

## 2019-10-06 NOTE — Plan of Care (Signed)
  Problem: Education: Goal: Knowledge of disease or condition will improve Outcome: Progressing   Problem: Nutrition: Goal: Dietary intake will improve Outcome: Progressing   Problem: Activity: Goal: Risk for activity intolerance will decrease Outcome: Progressing

## 2019-10-07 ENCOUNTER — Inpatient Hospital Stay (HOSPITAL_COMMUNITY): Payer: Medicare HMO

## 2019-10-07 DIAGNOSIS — M2550 Pain in unspecified joint: Secondary | ICD-10-CM | POA: Diagnosis not present

## 2019-10-07 DIAGNOSIS — I69391 Dysphagia following cerebral infarction: Secondary | ICD-10-CM | POA: Diagnosis not present

## 2019-10-07 DIAGNOSIS — I679 Cerebrovascular disease, unspecified: Secondary | ICD-10-CM | POA: Diagnosis not present

## 2019-10-07 DIAGNOSIS — R1310 Dysphagia, unspecified: Secondary | ICD-10-CM | POA: Diagnosis not present

## 2019-10-07 DIAGNOSIS — I2510 Atherosclerotic heart disease of native coronary artery without angina pectoris: Secondary | ICD-10-CM | POA: Diagnosis not present

## 2019-10-07 DIAGNOSIS — G729 Myopathy, unspecified: Secondary | ICD-10-CM | POA: Diagnosis not present

## 2019-10-07 DIAGNOSIS — R4701 Aphasia: Secondary | ICD-10-CM | POA: Diagnosis not present

## 2019-10-07 DIAGNOSIS — I69320 Aphasia following cerebral infarction: Secondary | ICD-10-CM | POA: Diagnosis not present

## 2019-10-07 DIAGNOSIS — R2681 Unsteadiness on feet: Secondary | ICD-10-CM | POA: Diagnosis not present

## 2019-10-07 DIAGNOSIS — G459 Transient cerebral ischemic attack, unspecified: Secondary | ICD-10-CM | POA: Diagnosis not present

## 2019-10-07 DIAGNOSIS — R7309 Other abnormal glucose: Secondary | ICD-10-CM | POA: Diagnosis not present

## 2019-10-07 DIAGNOSIS — R531 Weakness: Secondary | ICD-10-CM | POA: Diagnosis not present

## 2019-10-07 DIAGNOSIS — I6602 Occlusion and stenosis of left middle cerebral artery: Secondary | ICD-10-CM | POA: Diagnosis not present

## 2019-10-07 DIAGNOSIS — M6281 Muscle weakness (generalized): Secondary | ICD-10-CM | POA: Diagnosis not present

## 2019-10-07 DIAGNOSIS — R52 Pain, unspecified: Secondary | ICD-10-CM | POA: Diagnosis not present

## 2019-10-07 DIAGNOSIS — I63412 Cerebral infarction due to embolism of left middle cerebral artery: Secondary | ICD-10-CM | POA: Diagnosis not present

## 2019-10-07 DIAGNOSIS — I10 Essential (primary) hypertension: Secondary | ICD-10-CM | POA: Diagnosis not present

## 2019-10-07 DIAGNOSIS — I63312 Cerebral infarction due to thrombosis of left middle cerebral artery: Secondary | ICD-10-CM | POA: Diagnosis not present

## 2019-10-07 DIAGNOSIS — Z7401 Bed confinement status: Secondary | ICD-10-CM | POA: Diagnosis not present

## 2019-10-07 LAB — BASIC METABOLIC PANEL
Anion gap: 8 (ref 5–15)
BUN: 17 mg/dL (ref 8–23)
CO2: 26 mmol/L (ref 22–32)
Calcium: 9.3 mg/dL (ref 8.9–10.3)
Chloride: 104 mmol/L (ref 98–111)
Creatinine, Ser: 1.07 mg/dL (ref 0.61–1.24)
GFR calc Af Amer: >60 mL/min (ref >60)
GFR calc non Af Amer: >60 mL/min (ref >60)
Glucose, Bld: 156 mg/dL — ABNORMAL HIGH (ref 70–99)
Potassium: 4.2 mmol/L (ref 3.5–5.1)
Sodium: 138 mmol/L (ref 135–145)

## 2019-10-07 LAB — CBC
HCT: 32.3 % — ABNORMAL LOW (ref 39.0–52.0)
Hemoglobin: 10.2 g/dL — ABNORMAL LOW (ref 13.0–17.0)
MCH: 28.7 pg (ref 26.0–34.0)
MCHC: 31.6 g/dL (ref 30.0–36.0)
MCV: 90.7 fL (ref 80.0–100.0)
Platelets: 401 10*3/uL — ABNORMAL HIGH (ref 150–400)
RBC: 3.56 MIL/uL — ABNORMAL LOW (ref 4.22–5.81)
RDW: 12.8 % (ref 11.5–15.5)
WBC: 6.9 10*3/uL (ref 4.0–10.5)
nRBC: 0 % (ref 0.0–0.2)

## 2019-10-07 LAB — GLUCOSE, CAPILLARY
Glucose-Capillary: 256 mg/dL — ABNORMAL HIGH (ref 70–99)
Glucose-Capillary: 150 mg/dL — ABNORMAL HIGH (ref 70–99)
Glucose-Capillary: 241 mg/dL — ABNORMAL HIGH (ref 70–99)

## 2019-10-07 LAB — SARS CORONAVIRUS 2 BY RT PCR (HOSPITAL ORDER, PERFORMED IN ~~LOC~~ HOSPITAL LAB): SARS Coronavirus 2: NEGATIVE

## 2019-10-07 MED ORDER — AMLODIPINE BESYLATE 10 MG PO TABS: 10.0000 mg | ORAL_TABLET | Freq: Every day | ORAL | Status: AC

## 2019-10-07 MED ORDER — APIXABAN 5 MG PO TABS: 5.0000 mg | ORAL_TABLET | Freq: Two times a day (BID) | ORAL | Status: AC

## 2019-10-07 MED ORDER — ATORVASTATIN CALCIUM 40 MG PO TABS: 40.0000 mg | ORAL_TABLET | Freq: Every day | ORAL | Status: AC

## 2019-10-07 MED ORDER — RESOURCE THICKENUP CLEAR PO POWD: 1.0000 | ORAL | Status: AC | PRN

## 2019-10-07 MED ORDER — QUETIAPINE FUMARATE 25 MG PO TABS: 12.5000 mg | ORAL_TABLET | Freq: Every day | ORAL | Status: DC

## 2019-10-07 NOTE — Progress Notes (Addendum)
Modified Barium Swallow Progress Note  Patient Details  Name: Gerald Rosario MRN: [id number] Date of Birth: 1946-01-28  Today's Date: 10/07/2019  Modified Barium Swallow completed.  Full report located under Chart Review in the Imaging Section.  Brief recommendations include the following:  Clinical Impression    Pt presents with subtle improvements in swallow function compared to initial MBS. His impulsivity has improved though, as has his ability to follow a few simple commands once given demonstrations by SLP. Suspect that he may still be limited by his baseline impaired vocal cord, has aspiration occurs with thin liquids during the swallow. It occurs when liquids reach the pyriform sinuses before swallowing - either on the initial swallow or with the second swallow he does as he tries to reduce the mild-moderate residue that remains in the pharynx. This has the appearance today of pharyngeal ears in addition to his vallecular residue. During this study pt was able to try postural strategies, but this did not increase his airway protection. Nectar thick liquids by spoon are still the most effective at reducing aspiration, but even with this, there was a single sensed aspiration event that occurred when pt seemed to have a small belch while trying to swallow. Other aspiration events produced intermittent delayed coughing, but even when he coughed, it was ineffective. Will advance solids to mechanical soft, continuing nectar thick liquids by spoon only. SLP will f/u for potential pharyngeal exercises now that his receptive language is starting to improve.    Swallow Evaluation Recommendations       SLP Diet Recommendations: Dysphagia 3 (Mech soft) solids;Nectar thick liquid   Liquid Administration via: Spoon   Medication Administration: Whole meds with puree   Supervision: Staff to assist with self feeding;Full supervision/cueing for compensatory strategies   Compensations: Slow rate;Small  sips/bites   Postural Changes: Seated upright at 90 degrees   Oral Care Recommendations: Oral care BID   Other Recommendations: Order thickener from pharmacy;Prohibited food (jello, ice cream, thin soups);Remove water pitcher    Osie Bond., M.A. [facility] Pager [phone] Office [phone]  10/07/2019,12:07 PM

## 2019-10-07 NOTE — TOC Transition Note (Signed)
Transition of Care [facility]) - CM/SW Discharge Note   Patient Details  Name: Gerald Rosario MRN: [id number] Date of Birth: [birth year]  Transition of Care [facility]) CM/SW Contact:  Pollie Friar, RN Phone Number: 10/07/2019, 1:21 PM   Clinical Narrative:    Pt discharging to [facility] today. CM has updated pts daughter via phone. [facility] to provide transportation.  Bedside Rn updated and d/c packet at the desk.   Room: 119 Number for report: [phone]   Final next level of care: Skilled Nursing Facility Barriers to Discharge: No Barriers Identified   Patient Goals and CMS Choice   CMS Medicare.gov Compare Post Acute Care list provided to:: Patient Represenative (must comment) Choice offered to / list presented to : Adult Children  Discharge Placement              Patient chooses bed at:  ([facility]) Patient to be transferred to facility by: [place] Name of family member notified: Kerry--daughter Patient and family notified of of transfer: 10/07/19  Discharge Plan and Services                                     Social Determinants of Health (SDOH) Interventions     Readmission Risk Interventions No flowsheet data found.

## 2019-10-08 ENCOUNTER — Telehealth: Payer: Self-pay | Admitting: *Deleted

## 2019-10-08 NOTE — Telephone Encounter (Signed)
Pt was on TCM report admitted 09/24/19 for onset L eye deviation, aphasia, R sided weakness and R facial droop.  CTA showed L ICA and L M1 occlusion. 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 Pt D/C 10/07/19 to SNF per summary will need to f/u w/PCP once discharge from skill nursing.Marland KitchenJohny Chess

## 2019-10-10 DIAGNOSIS — R4701 Aphasia: Secondary | ICD-10-CM | POA: Diagnosis not present

## 2019-10-10 DIAGNOSIS — G729 Myopathy, unspecified: Secondary | ICD-10-CM | POA: Diagnosis not present

## 2019-10-10 DIAGNOSIS — I679 Cerebrovascular disease, unspecified: Secondary | ICD-10-CM | POA: Diagnosis not present

## 2019-10-10 DIAGNOSIS — I10 Essential (primary) hypertension: Secondary | ICD-10-CM | POA: Diagnosis not present

## 2019-10-10 DIAGNOSIS — R1310 Dysphagia, unspecified: Secondary | ICD-10-CM | POA: Diagnosis not present

## 2019-10-10 DIAGNOSIS — I2510 Atherosclerotic heart disease of native coronary artery without angina pectoris: Secondary | ICD-10-CM | POA: Diagnosis not present

## 2019-10-10 DIAGNOSIS — I63312 Cerebral infarction due to thrombosis of left middle cerebral artery: Secondary | ICD-10-CM | POA: Diagnosis not present

## 2019-10-14 DIAGNOSIS — G729 Myopathy, unspecified: Secondary | ICD-10-CM | POA: Diagnosis not present

## 2019-10-14 DIAGNOSIS — I679 Cerebrovascular disease, unspecified: Secondary | ICD-10-CM | POA: Diagnosis not present

## 2019-10-14 DIAGNOSIS — I2510 Atherosclerotic heart disease of native coronary artery without angina pectoris: Secondary | ICD-10-CM | POA: Diagnosis not present

## 2019-10-14 DIAGNOSIS — I10 Essential (primary) hypertension: Secondary | ICD-10-CM | POA: Diagnosis not present

## 2019-10-17 DIAGNOSIS — I69391 Dysphagia following cerebral infarction: Secondary | ICD-10-CM | POA: Diagnosis not present

## 2019-10-17 DIAGNOSIS — I69320 Aphasia following cerebral infarction: Secondary | ICD-10-CM | POA: Diagnosis not present

## 2019-10-20 DIAGNOSIS — R1310 Dysphagia, unspecified: Secondary | ICD-10-CM | POA: Diagnosis not present

## 2019-10-20 DIAGNOSIS — I10 Essential (primary) hypertension: Secondary | ICD-10-CM | POA: Diagnosis not present

## 2019-10-20 DIAGNOSIS — T148XXA Other injury of unspecified body region, initial encounter: Secondary | ICD-10-CM | POA: Diagnosis not present

## 2019-10-20 DIAGNOSIS — I69320 Aphasia following cerebral infarction: Secondary | ICD-10-CM | POA: Diagnosis not present

## 2019-10-20 DIAGNOSIS — I69391 Dysphagia following cerebral infarction: Secondary | ICD-10-CM | POA: Diagnosis not present

## 2019-10-20 DIAGNOSIS — R4701 Aphasia: Secondary | ICD-10-CM | POA: Diagnosis not present

## 2019-10-20 DIAGNOSIS — I63312 Cerebral infarction due to thrombosis of left middle cerebral artery: Secondary | ICD-10-CM | POA: Diagnosis not present

## 2019-10-21 DIAGNOSIS — I69391 Dysphagia following cerebral infarction: Secondary | ICD-10-CM | POA: Diagnosis not present

## 2019-10-21 DIAGNOSIS — I69320 Aphasia following cerebral infarction: Secondary | ICD-10-CM | POA: Diagnosis not present

## 2019-10-22 DIAGNOSIS — E118 Type 2 diabetes mellitus with unspecified complications: Secondary | ICD-10-CM | POA: Diagnosis not present

## 2019-10-22 DIAGNOSIS — S40812A Abrasion of left upper arm, initial encounter: Secondary | ICD-10-CM | POA: Diagnosis not present

## 2019-10-22 DIAGNOSIS — I69391 Dysphagia following cerebral infarction: Secondary | ICD-10-CM | POA: Diagnosis not present

## 2019-10-22 DIAGNOSIS — I69320 Aphasia following cerebral infarction: Secondary | ICD-10-CM | POA: Diagnosis not present

## 2019-10-22 DIAGNOSIS — M6281 Muscle weakness (generalized): Secondary | ICD-10-CM | POA: Diagnosis not present

## 2019-10-23 DIAGNOSIS — I69391 Dysphagia following cerebral infarction: Secondary | ICD-10-CM | POA: Diagnosis not present

## 2019-10-23 DIAGNOSIS — I69320 Aphasia following cerebral infarction: Secondary | ICD-10-CM | POA: Diagnosis not present

## 2019-10-24 DIAGNOSIS — I69391 Dysphagia following cerebral infarction: Secondary | ICD-10-CM | POA: Diagnosis not present

## 2019-10-24 DIAGNOSIS — I69320 Aphasia following cerebral infarction: Secondary | ICD-10-CM | POA: Diagnosis not present

## 2019-10-27 DIAGNOSIS — S41111D Laceration without foreign body of right upper arm, subsequent encounter: Secondary | ICD-10-CM | POA: Diagnosis not present

## 2019-10-28 DIAGNOSIS — I69391 Dysphagia following cerebral infarction: Secondary | ICD-10-CM | POA: Diagnosis not present

## 2019-10-28 DIAGNOSIS — I69320 Aphasia following cerebral infarction: Secondary | ICD-10-CM | POA: Diagnosis not present

## 2019-10-29 DIAGNOSIS — I69320 Aphasia following cerebral infarction: Secondary | ICD-10-CM | POA: Diagnosis not present

## 2019-10-29 DIAGNOSIS — I69391 Dysphagia following cerebral infarction: Secondary | ICD-10-CM | POA: Diagnosis not present

## 2019-10-29 DIAGNOSIS — M6281 Muscle weakness (generalized): Secondary | ICD-10-CM | POA: Diagnosis not present

## 2019-10-29 DIAGNOSIS — S40812D Abrasion of left upper arm, subsequent encounter: Secondary | ICD-10-CM | POA: Diagnosis not present

## 2019-10-29 DIAGNOSIS — E118 Type 2 diabetes mellitus with unspecified complications: Secondary | ICD-10-CM | POA: Diagnosis not present

## 2019-10-30 DIAGNOSIS — I69391 Dysphagia following cerebral infarction: Secondary | ICD-10-CM | POA: Diagnosis not present

## 2019-10-30 DIAGNOSIS — I69320 Aphasia following cerebral infarction: Secondary | ICD-10-CM | POA: Diagnosis not present

## 2019-10-31 DIAGNOSIS — I69320 Aphasia following cerebral infarction: Secondary | ICD-10-CM | POA: Diagnosis not present

## 2019-10-31 DIAGNOSIS — I69391 Dysphagia following cerebral infarction: Secondary | ICD-10-CM | POA: Diagnosis not present

## 2019-11-03 DIAGNOSIS — I69320 Aphasia following cerebral infarction: Secondary | ICD-10-CM | POA: Diagnosis not present

## 2019-11-03 DIAGNOSIS — I69391 Dysphagia following cerebral infarction: Secondary | ICD-10-CM | POA: Diagnosis not present

## 2019-11-04 DIAGNOSIS — I69320 Aphasia following cerebral infarction: Secondary | ICD-10-CM | POA: Diagnosis not present

## 2019-11-04 DIAGNOSIS — I69391 Dysphagia following cerebral infarction: Secondary | ICD-10-CM | POA: Diagnosis not present

## 2019-11-07 DIAGNOSIS — R918 Other nonspecific abnormal finding of lung field: Secondary | ICD-10-CM | POA: Diagnosis not present

## 2019-11-07 DIAGNOSIS — I679 Cerebrovascular disease, unspecified: Secondary | ICD-10-CM | POA: Diagnosis not present

## 2019-11-07 DIAGNOSIS — I2510 Atherosclerotic heart disease of native coronary artery without angina pectoris: Secondary | ICD-10-CM | POA: Diagnosis not present

## 2019-11-07 DIAGNOSIS — R1310 Dysphagia, unspecified: Secondary | ICD-10-CM | POA: Diagnosis not present

## 2019-11-07 DIAGNOSIS — U071 COVID-19: Secondary | ICD-10-CM | POA: Diagnosis not present

## 2019-11-07 DIAGNOSIS — I10 Essential (primary) hypertension: Secondary | ICD-10-CM | POA: Diagnosis not present

## 2019-11-07 DIAGNOSIS — R4701 Aphasia: Secondary | ICD-10-CM | POA: Diagnosis not present

## 2019-11-10 ENCOUNTER — Inpatient Hospital Stay: Payer: Medicare HMO | Admitting: Adult Health

## 2019-11-10 ENCOUNTER — Encounter: Payer: Self-pay | Admitting: Adult Health
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
## 2019-11-11 DIAGNOSIS — U071 COVID-19: Secondary | ICD-10-CM | POA: Diagnosis not present

## 2019-11-11 DIAGNOSIS — E039 Hypothyroidism, unspecified: Secondary | ICD-10-CM | POA: Diagnosis not present

## 2019-11-11 DIAGNOSIS — M79604 Pain in right leg: Secondary | ICD-10-CM | POA: Diagnosis not present

## 2019-11-11 DIAGNOSIS — I2510 Atherosclerotic heart disease of native coronary artery without angina pectoris: Secondary | ICD-10-CM | POA: Diagnosis not present

## 2019-11-12 DIAGNOSIS — I63312 Cerebral infarction due to thrombosis of left middle cerebral artery: Secondary | ICD-10-CM | POA: Diagnosis not present

## 2019-11-12 DIAGNOSIS — R0989 Other specified symptoms and signs involving the circulatory and respiratory systems: Secondary | ICD-10-CM | POA: Diagnosis not present

## 2019-11-12 DIAGNOSIS — R918 Other nonspecific abnormal finding of lung field: Secondary | ICD-10-CM | POA: Diagnosis not present

## 2019-11-12 DIAGNOSIS — R4701 Aphasia: Secondary | ICD-10-CM | POA: Diagnosis not present

## 2019-11-12 DIAGNOSIS — I10 Essential (primary) hypertension: Secondary | ICD-10-CM | POA: Diagnosis not present

## 2019-11-12 DIAGNOSIS — R5081 Fever presenting with conditions classified elsewhere: Secondary | ICD-10-CM | POA: Diagnosis not present

## 2019-11-12 DIAGNOSIS — M79604 Pain in right leg: Secondary | ICD-10-CM | POA: Diagnosis not present

## 2019-11-12 DIAGNOSIS — U071 COVID-19: Secondary | ICD-10-CM | POA: Diagnosis not present

## 2019-11-17 ENCOUNTER — Ambulatory Visit: Payer: Self-pay

## 2019-11-17 DIAGNOSIS — M79604 Pain in right leg: Secondary | ICD-10-CM | POA: Diagnosis not present

## 2019-11-17 DIAGNOSIS — I10 Essential (primary) hypertension: Secondary | ICD-10-CM | POA: Diagnosis not present

## 2019-11-17 DIAGNOSIS — I2510 Atherosclerotic heart disease of native coronary artery without angina pectoris: Secondary | ICD-10-CM | POA: Diagnosis not present

## 2019-11-17 DIAGNOSIS — R0902 Hypoxemia: Secondary | ICD-10-CM | POA: Diagnosis not present

## 2019-11-17 DIAGNOSIS — R0989 Other specified symptoms and signs involving the circulatory and respiratory systems: Secondary | ICD-10-CM | POA: Diagnosis not present

## 2019-11-17 DIAGNOSIS — U071 COVID-19: Secondary | ICD-10-CM | POA: Diagnosis not present

## 2019-11-17 DIAGNOSIS — R5081 Fever presenting with conditions classified elsewhere: Secondary | ICD-10-CM | POA: Diagnosis not present

## 2019-11-17 DIAGNOSIS — R1310 Dysphagia, unspecified: Secondary | ICD-10-CM | POA: Diagnosis not present

## 2019-11-18 ENCOUNTER — Inpatient Hospital Stay (HOSPITAL_COMMUNITY)
Admission: EM | Admit: 2019-11-18 | Discharge: 2019-12-03 | DRG: 177 | Disposition: E | Payer: No Typology Code available for payment source | Source: Skilled Nursing Facility | Attending: Family Medicine | Admitting: Family Medicine

## 2019-11-18 ENCOUNTER — Emergency Department (HOSPITAL_COMMUNITY): Payer: No Typology Code available for payment source

## 2019-11-18 ENCOUNTER — Other Ambulatory Visit: Payer: Self-pay

## 2019-11-18 ENCOUNTER — Encounter (HOSPITAL_COMMUNITY): Payer: Self-pay

## 2019-11-18 DIAGNOSIS — E7800 Pure hypercholesterolemia, unspecified: Secondary | ICD-10-CM | POA: Diagnosis not present

## 2019-11-18 DIAGNOSIS — Z66 Do not resuscitate: Secondary | ICD-10-CM | POA: Diagnosis present

## 2019-11-18 DIAGNOSIS — Z9189 Other specified personal risk factors, not elsewhere classified: Secondary | ICD-10-CM | POA: Diagnosis not present

## 2019-11-18 DIAGNOSIS — I252 Old myocardial infarction: Secondary | ICD-10-CM

## 2019-11-18 DIAGNOSIS — J45909 Unspecified asthma, uncomplicated: Secondary | ICD-10-CM | POA: Diagnosis present

## 2019-11-18 DIAGNOSIS — J151 Pneumonia due to Pseudomonas: Secondary | ICD-10-CM | POA: Diagnosis present

## 2019-11-18 DIAGNOSIS — R6520 Severe sepsis without septic shock: Secondary | ICD-10-CM | POA: Diagnosis not present

## 2019-11-18 DIAGNOSIS — Z7901 Long term (current) use of anticoagulants: Secondary | ICD-10-CM

## 2019-11-18 DIAGNOSIS — I110 Hypertensive heart disease with heart failure: Secondary | ICD-10-CM

## 2019-11-18 DIAGNOSIS — E785 Hyperlipidemia, unspecified: Secondary | ICD-10-CM | POA: Diagnosis present

## 2019-11-18 DIAGNOSIS — U071 COVID-19: Principal | ICD-10-CM | POA: Diagnosis present

## 2019-11-18 DIAGNOSIS — Z87891 Personal history of nicotine dependence: Secondary | ICD-10-CM

## 2019-11-18 DIAGNOSIS — I130 Hypertensive heart and chronic kidney disease with heart failure and stage 1 through stage 4 chronic kidney disease, or unspecified chronic kidney disease: Secondary | ICD-10-CM | POA: Diagnosis present

## 2019-11-18 DIAGNOSIS — R791 Abnormal coagulation profile: Secondary | ICD-10-CM | POA: Diagnosis not present

## 2019-11-18 DIAGNOSIS — Z79890 Hormone replacement therapy (postmenopausal): Secondary | ICD-10-CM | POA: Diagnosis not present

## 2019-11-18 DIAGNOSIS — R0902 Hypoxemia: Secondary | ICD-10-CM | POA: Diagnosis not present

## 2019-11-18 DIAGNOSIS — Z7189 Other specified counseling: Secondary | ICD-10-CM

## 2019-11-18 DIAGNOSIS — J9601 Acute respiratory failure with hypoxia: Secondary | ICD-10-CM | POA: Diagnosis present

## 2019-11-18 DIAGNOSIS — I639 Cerebral infarction, unspecified: Secondary | ICD-10-CM | POA: Diagnosis not present

## 2019-11-18 DIAGNOSIS — I255 Ischemic cardiomyopathy: Secondary | ICD-10-CM | POA: Diagnosis present

## 2019-11-18 DIAGNOSIS — J690 Pneumonitis due to inhalation of food and vomit: Secondary | ICD-10-CM | POA: Diagnosis not present

## 2019-11-18 DIAGNOSIS — C61 Malignant neoplasm of prostate: Secondary | ICD-10-CM | POA: Diagnosis present

## 2019-11-18 DIAGNOSIS — J069 Acute upper respiratory infection, unspecified: Secondary | ICD-10-CM

## 2019-11-18 DIAGNOSIS — D72810 Lymphocytopenia: Secondary | ICD-10-CM | POA: Diagnosis present

## 2019-11-18 DIAGNOSIS — Z7951 Long term (current) use of inhaled steroids: Secondary | ICD-10-CM | POA: Diagnosis not present

## 2019-11-18 DIAGNOSIS — I428 Other cardiomyopathies: Secondary | ICD-10-CM | POA: Diagnosis not present

## 2019-11-18 DIAGNOSIS — E875 Hyperkalemia: Secondary | ICD-10-CM | POA: Diagnosis not present

## 2019-11-18 DIAGNOSIS — I10 Essential (primary) hypertension: Secondary | ICD-10-CM | POA: Diagnosis present

## 2019-11-18 DIAGNOSIS — Z515 Encounter for palliative care: Secondary | ICD-10-CM

## 2019-11-18 DIAGNOSIS — R918 Other nonspecific abnormal finding of lung field: Secondary | ICD-10-CM | POA: Diagnosis not present

## 2019-11-18 DIAGNOSIS — Z7982 Long term (current) use of aspirin: Secondary | ICD-10-CM | POA: Diagnosis not present

## 2019-11-18 DIAGNOSIS — E1140 Type 2 diabetes mellitus with diabetic neuropathy, unspecified: Secondary | ICD-10-CM | POA: Diagnosis not present

## 2019-11-18 DIAGNOSIS — I2510 Atherosclerotic heart disease of native coronary artery without angina pectoris: Secondary | ICD-10-CM | POA: Diagnosis present

## 2019-11-18 DIAGNOSIS — J1282 Pneumonia due to coronavirus disease 2019: Secondary | ICD-10-CM | POA: Diagnosis not present

## 2019-11-18 DIAGNOSIS — H9190 Unspecified hearing loss, unspecified ear: Secondary | ICD-10-CM | POA: Diagnosis present

## 2019-11-18 DIAGNOSIS — D649 Anemia, unspecified: Secondary | ICD-10-CM | POA: Diagnosis present

## 2019-11-18 DIAGNOSIS — I5043 Acute on chronic combined systolic (congestive) and diastolic (congestive) heart failure: Secondary | ICD-10-CM | POA: Diagnosis present

## 2019-11-18 DIAGNOSIS — E872 Acidosis: Secondary | ICD-10-CM | POA: Diagnosis present

## 2019-11-18 DIAGNOSIS — K219 Gastro-esophageal reflux disease without esophagitis: Secondary | ICD-10-CM | POA: Diagnosis present

## 2019-11-18 DIAGNOSIS — Z794 Long term (current) use of insulin: Secondary | ICD-10-CM

## 2019-11-18 DIAGNOSIS — E039 Hypothyroidism, unspecified: Secondary | ICD-10-CM | POA: Diagnosis present

## 2019-11-18 DIAGNOSIS — I509 Heart failure, unspecified: Secondary | ICD-10-CM

## 2019-11-18 DIAGNOSIS — I517 Cardiomegaly: Secondary | ICD-10-CM | POA: Diagnosis not present

## 2019-11-18 DIAGNOSIS — I5022 Chronic systolic (congestive) heart failure: Secondary | ICD-10-CM | POA: Diagnosis present

## 2019-11-18 DIAGNOSIS — J4530 Mild persistent asthma, uncomplicated: Secondary | ICD-10-CM | POA: Diagnosis not present

## 2019-11-18 DIAGNOSIS — J189 Pneumonia, unspecified organism: Secondary | ICD-10-CM | POA: Diagnosis not present

## 2019-11-18 DIAGNOSIS — I480 Paroxysmal atrial fibrillation: Secondary | ICD-10-CM | POA: Diagnosis not present

## 2019-11-18 DIAGNOSIS — E119 Type 2 diabetes mellitus without complications: Secondary | ICD-10-CM

## 2019-11-18 DIAGNOSIS — N139 Obstructive and reflux uropathy, unspecified: Secondary | ICD-10-CM | POA: Diagnosis present

## 2019-11-18 DIAGNOSIS — Z79899 Other long term (current) drug therapy: Secondary | ICD-10-CM

## 2019-11-18 DIAGNOSIS — R404 Transient alteration of awareness: Secondary | ICD-10-CM | POA: Diagnosis not present

## 2019-11-18 DIAGNOSIS — J90 Pleural effusion, not elsewhere classified: Secondary | ICD-10-CM | POA: Diagnosis not present

## 2019-11-18 DIAGNOSIS — Z951 Presence of aortocoronary bypass graft: Secondary | ICD-10-CM | POA: Diagnosis not present

## 2019-11-18 DIAGNOSIS — Z955 Presence of coronary angioplasty implant and graft: Secondary | ICD-10-CM

## 2019-11-18 DIAGNOSIS — R531 Weakness: Secondary | ICD-10-CM | POA: Diagnosis not present

## 2019-11-18 DIAGNOSIS — E1122 Type 2 diabetes mellitus with diabetic chronic kidney disease: Secondary | ICD-10-CM | POA: Diagnosis present

## 2019-11-18 DIAGNOSIS — A4152 Sepsis due to Pseudomonas: Secondary | ICD-10-CM | POA: Diagnosis not present

## 2019-11-18 DIAGNOSIS — I69320 Aphasia following cerebral infarction: Secondary | ICD-10-CM

## 2019-11-18 DIAGNOSIS — N189 Chronic kidney disease, unspecified: Secondary | ICD-10-CM | POA: Diagnosis present

## 2019-11-18 DIAGNOSIS — N400 Enlarged prostate without lower urinary tract symptoms: Secondary | ICD-10-CM | POA: Diagnosis present

## 2019-11-18 DIAGNOSIS — Z9889 Other specified postprocedural states: Secondary | ICD-10-CM | POA: Diagnosis not present

## 2019-11-18 DIAGNOSIS — E1142 Type 2 diabetes mellitus with diabetic polyneuropathy: Secondary | ICD-10-CM | POA: Diagnosis present

## 2019-11-18 DIAGNOSIS — R0602 Shortness of breath: Secondary | ICD-10-CM | POA: Diagnosis not present

## 2019-11-18 DIAGNOSIS — R0689 Other abnormalities of breathing: Secondary | ICD-10-CM | POA: Diagnosis not present

## 2019-11-18 LAB — CBC WITH DIFFERENTIAL/PLATELET
Abs Immature Granulocytes: 0.1 10*3/uL — ABNORMAL HIGH (ref 0.00–0.07)
Basophils Absolute: 0 10*3/uL (ref 0.0–0.1)
Basophils Relative: 0 %
Eosinophils Absolute: 0 10*3/uL (ref 0.0–0.5)
Eosinophils Relative: 0 %
HCT: 35.6 % — ABNORMAL LOW (ref 39.0–52.0)
Hemoglobin: 11.7 g/dL — ABNORMAL LOW (ref 13.0–17.0)
Immature Granulocytes: 1 %
Lymphocytes Relative: 3 %
Lymphs Abs: 0.4 10*3/uL — ABNORMAL LOW (ref 0.7–4.0)
MCH: 29 pg (ref 26.0–34.0)
MCHC: 32.9 g/dL (ref 30.0–36.0)
MCV: 88.1 fL (ref 80.0–100.0)
Monocytes Absolute: 0.8 10*3/uL (ref 0.1–1.0)
Monocytes Relative: 6 %
Neutro Abs: 12.7 10*3/uL — ABNORMAL HIGH (ref 1.7–7.7)
Neutrophils Relative %: 90 %
Platelets: 496 10*3/uL — ABNORMAL HIGH (ref 150–400)
RBC: 4.04 MIL/uL — ABNORMAL LOW (ref 4.22–5.81)
RDW: 13.6 % (ref 11.5–15.5)
WBC: 14 10*3/uL — ABNORMAL HIGH (ref 4.0–10.5)
nRBC: 0 % (ref 0.0–0.2)

## 2019-11-18 LAB — C-REACTIVE PROTEIN: CRP: 11.6 mg/dL — ABNORMAL HIGH (ref <1.0)

## 2019-11-18 LAB — COMPREHENSIVE METABOLIC PANEL
ALT: 28 U/L (ref 0–44)
AST: 35 U/L (ref 15–41)
Albumin: 3.2 g/dL — ABNORMAL LOW (ref 3.5–5.0)
Alkaline Phosphatase: 69 U/L (ref 38–126)
Anion gap: 10 (ref 5–15)
BUN: 31 mg/dL — ABNORMAL HIGH (ref 8–23)
CO2: 21 mmol/L — ABNORMAL LOW (ref 22–32)
Calcium: 9.7 mg/dL (ref 8.9–10.3)
Chloride: 104 mmol/L (ref 98–111)
Creatinine, Ser: 1.09 mg/dL (ref 0.61–1.24)
GFR calc Af Amer: >60 mL/min (ref >60)
GFR calc non Af Amer: >60 mL/min (ref >60)
Glucose, Bld: 139 mg/dL — ABNORMAL HIGH (ref 70–99)
Potassium: 4.5 mmol/L (ref 3.5–5.1)
Sodium: 135 mmol/L (ref 135–145)
Total Bilirubin: 1.1 mg/dL (ref 0.3–1.2)
Total Protein: 7.8 g/dL (ref 6.5–8.1)

## 2019-11-18 LAB — PROCALCITONIN: Procalcitonin: <0.1 ng/mL

## 2019-11-18 LAB — LACTATE DEHYDROGENASE: LDH: 270 U/L — ABNORMAL HIGH (ref 98–192)

## 2019-11-18 LAB — D-DIMER, QUANTITATIVE: D-Dimer, Quant: 2.34 ug/mL-FEU — ABNORMAL HIGH (ref 0.00–0.50)

## 2019-11-18 LAB — FIBRINOGEN: Fibrinogen: 617 mg/dL — ABNORMAL HIGH (ref 210–475)

## 2019-11-18 LAB — SARS CORONAVIRUS 2 BY RT PCR (HOSPITAL ORDER, PERFORMED IN ~~LOC~~ HOSPITAL LAB): SARS Coronavirus 2: POSITIVE — AB

## 2019-11-18 LAB — FERRITIN: Ferritin: 1091 ng/mL — ABNORMAL HIGH (ref 24–336)

## 2019-11-18 LAB — LACTIC ACID, PLASMA
Lactic Acid, Venous: 3.2 mmol/L (ref 0.5–1.9)
Lactic Acid, Venous: 1.3 mmol/L (ref 0.5–1.9)

## 2019-11-18 LAB — BRAIN NATRIURETIC PEPTIDE: B Natriuretic Peptide: 558.7 pg/mL — ABNORMAL HIGH (ref 0.0–100.0)

## 2019-11-18 LAB — TRIGLYCERIDES: Triglycerides: 95 mg/dL (ref <150)

## 2019-11-18 LAB — TROPONIN I (HIGH SENSITIVITY): Troponin I (High Sensitivity): 11 ng/L (ref <18)

## 2019-11-18 MED ORDER — SODIUM CHLORIDE 0.9 % IV SOLN
200.0000 mg | Freq: Once | INTRAVENOUS | Status: AC
  Administered 2019-11-18: 200 mg via INTRAVENOUS
  Filled 2019-11-18: qty 200

## 2019-11-18 MED ORDER — SODIUM CHLORIDE 0.9 % IV SOLN
100.0000 mg | Freq: Every day | INTRAVENOUS | Status: AC
  Administered 2019-11-19 – 2019-11-22 (×4): 100 mg via INTRAVENOUS
  Filled 2019-11-18 (×4): qty 20

## 2019-11-18 MED ORDER — SODIUM CHLORIDE 0.9 % IV BOLUS
500.0000 mL | Freq: Once | INTRAVENOUS | Status: AC
  Administered 2019-11-18: 500 mL via INTRAVENOUS

## 2019-11-18 MED ORDER — DEXAMETHASONE SODIUM PHOSPHATE 10 MG/ML IJ SOLN
10.0000 mg | Freq: Once | INTRAMUSCULAR | Status: AC
  Administered 2019-11-18: 10 mg via INTRAVENOUS
  Filled 2019-11-18: qty 1
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
## 2019-11-18 NOTE — ED Notes (Signed)
Still waiting for pt's urine. Pt has not yet given urine.

## 2019-11-18 NOTE — ED Notes (Signed)
Dr Nehemiah Settle is accepting at [facility]. Dr Langston Masker is sending.

## 2019-11-18 NOTE — ED Triage Notes (Signed)
EMS reports from [facility], called out for Sp02 87 on 2 ltrs 24-7 at facility. Pt Dx Covid + approx. 2 weeks ago and placed in facility quarantine. Prior stroke approx. 2 months ago. A&O to baseline on arrival. Pt non verbal.  BP 136/84 HR 88 RR 20 Sp02 96 on 6ltr Nasal Cannula

## 2019-11-19 DIAGNOSIS — I5022 Chronic systolic (congestive) heart failure: Secondary | ICD-10-CM

## 2019-11-19 DIAGNOSIS — I255 Ischemic cardiomyopathy: Secondary | ICD-10-CM

## 2019-11-19 DIAGNOSIS — I2583 Coronary atherosclerosis due to lipid rich plaque: Secondary | ICD-10-CM

## 2019-11-19 DIAGNOSIS — J9601 Acute respiratory failure with hypoxia: Secondary | ICD-10-CM

## 2019-11-19 DIAGNOSIS — E1140 Type 2 diabetes mellitus with diabetic neuropathy, unspecified: Secondary | ICD-10-CM

## 2019-11-19 DIAGNOSIS — I63412 Cerebral infarction due to embolism of left middle cerebral artery: Secondary | ICD-10-CM

## 2019-11-19 DIAGNOSIS — I10 Essential (primary) hypertension: Secondary | ICD-10-CM

## 2019-11-19 DIAGNOSIS — J4530 Mild persistent asthma, uncomplicated: Secondary | ICD-10-CM

## 2019-11-19 DIAGNOSIS — I2510 Atherosclerotic heart disease of native coronary artery without angina pectoris: Secondary | ICD-10-CM

## 2019-11-19 DIAGNOSIS — N139 Obstructive and reflux uropathy, unspecified: Secondary | ICD-10-CM

## 2019-11-19 DIAGNOSIS — I480 Paroxysmal atrial fibrillation: Secondary | ICD-10-CM

## 2019-11-19 DIAGNOSIS — E039 Hypothyroidism, unspecified: Secondary | ICD-10-CM

## 2019-11-19 DIAGNOSIS — Z951 Presence of aortocoronary bypass graft: Secondary | ICD-10-CM

## 2019-11-19 DIAGNOSIS — E7800 Pure hypercholesterolemia, unspecified: Secondary | ICD-10-CM

## 2019-11-19 LAB — COMPREHENSIVE METABOLIC PANEL
ALT: 24 U/L (ref 0–44)
AST: 24 U/L (ref 15–41)
Albumin: 2.7 g/dL — ABNORMAL LOW (ref 3.5–5.0)
Alkaline Phosphatase: 58 U/L (ref 38–126)
Anion gap: 8 (ref 5–15)
BUN: 33 mg/dL — ABNORMAL HIGH (ref 8–23)
CO2: 23 mmol/L (ref 22–32)
Calcium: 9.4 mg/dL (ref 8.9–10.3)
Chloride: 107 mmol/L (ref 98–111)
Creatinine, Ser: 0.95 mg/dL (ref 0.61–1.24)
GFR calc Af Amer: >60 mL/min (ref >60)
GFR calc non Af Amer: >60 mL/min (ref >60)
Glucose, Bld: 326 mg/dL — ABNORMAL HIGH (ref 70–99)
Potassium: 5.4 mmol/L — ABNORMAL HIGH (ref 3.5–5.1)
Sodium: 138 mmol/L (ref 135–145)
Total Bilirubin: 0.7 mg/dL (ref 0.3–1.2)
Total Protein: 6.7 g/dL (ref 6.5–8.1)

## 2019-11-19 LAB — C-REACTIVE PROTEIN: CRP: 13.5 mg/dL — ABNORMAL HIGH (ref <1.0)

## 2019-11-19 LAB — CBC WITH DIFFERENTIAL/PLATELET
Abs Immature Granulocytes: 0.06 10*3/uL (ref 0.00–0.07)
Basophils Absolute: 0 10*3/uL (ref 0.0–0.1)
Basophils Relative: 0 %
Eosinophils Absolute: 0 10*3/uL (ref 0.0–0.5)
Eosinophils Relative: 0 %
HCT: 32.1 % — ABNORMAL LOW (ref 39.0–52.0)
Hemoglobin: 10.2 g/dL — ABNORMAL LOW (ref 13.0–17.0)
Immature Granulocytes: 1 %
Lymphocytes Relative: 4 %
Lymphs Abs: 0.3 10*3/uL — ABNORMAL LOW (ref 0.7–4.0)
MCH: 28.3 pg (ref 26.0–34.0)
MCHC: 31.8 g/dL (ref 30.0–36.0)
MCV: 88.9 fL (ref 80.0–100.0)
Monocytes Absolute: 0.2 10*3/uL (ref 0.1–1.0)
Monocytes Relative: 3 %
Neutro Abs: 5.9 10*3/uL (ref 1.7–7.7)
Neutrophils Relative %: 92 %
Platelets: 409 10*3/uL — ABNORMAL HIGH (ref 150–400)
RBC: 3.61 MIL/uL — ABNORMAL LOW (ref 4.22–5.81)
RDW: 13.7 % (ref 11.5–15.5)
WBC: 6.5 10*3/uL (ref 4.0–10.5)
nRBC: 0 % (ref 0.0–0.2)

## 2019-11-19 LAB — CBG MONITORING, ED
Glucose-Capillary: 268 mg/dL — ABNORMAL HIGH (ref 70–99)
Glucose-Capillary: 276 mg/dL — ABNORMAL HIGH (ref 70–99)
Glucose-Capillary: 283 mg/dL — ABNORMAL HIGH (ref 70–99)
Glucose-Capillary: 148 mg/dL — ABNORMAL HIGH (ref 70–99)

## 2019-11-19 LAB — PHOSPHORUS: Phosphorus: 3.7 mg/dL (ref 2.5–4.6)

## 2019-11-19 LAB — MAGNESIUM: Magnesium: 2.3 mg/dL (ref 1.7–2.4)

## 2019-11-19 LAB — FERRITIN: Ferritin: 741 ng/mL — ABNORMAL HIGH (ref 24–336)

## 2019-11-19 LAB — D-DIMER, QUANTITATIVE: D-Dimer, Quant: 2.39 ug/mL-FEU — ABNORMAL HIGH (ref 0.00–0.50)

## 2019-11-19 MED ORDER — IPRATROPIUM-ALBUTEROL 20-100 MCG/ACT IN AERS
1.0000 | INHALATION_SPRAY | Freq: Four times a day (QID) | RESPIRATORY_TRACT | Status: DC
  Administered 2019-11-19 – 2019-11-24 (×20): 1 via RESPIRATORY_TRACT
  Filled 2019-11-19 (×3): qty 4

## 2019-11-19 MED ORDER — FLUTICASONE FUROATE-VILANTEROL 100-25 MCG/INH IN AEPB
1.0000 | INHALATION_SPRAY | Freq: Every day | RESPIRATORY_TRACT | Status: DC
  Administered 2019-11-20 – 2019-11-24 (×5): 1 via RESPIRATORY_TRACT
  Filled 2019-11-19: qty 28

## 2019-11-19 MED ORDER — SODIUM ZIRCONIUM CYCLOSILICATE 10 G PO PACK
10.0000 g | PACK | Freq: Every day | ORAL | Status: DC
  Administered 2019-11-19: 10 g via ORAL
  Filled 2019-11-19 (×2): qty 1

## 2019-11-19 MED ORDER — APIXABAN 5 MG PO TABS
5.0000 mg | ORAL_TABLET | Freq: Two times a day (BID) | ORAL | Status: DC
  Administered 2019-11-19 – 2019-11-23 (×8): 5 mg via ORAL
  Filled 2019-11-19 (×9): qty 1

## 2019-11-19 MED ORDER — ASCORBIC ACID 500 MG PO TABS
500.0000 mg | ORAL_TABLET | Freq: Every day | ORAL | Status: DC
  Administered 2019-11-19 – 2019-11-23 (×5): 500 mg via ORAL
  Filled 2019-11-19 (×5): qty 1

## 2019-11-19 MED ORDER — POLYETHYLENE GLYCOL 3350 17 G PO PACK
17.0000 g | PACK | Freq: Every day | ORAL | Status: DC | PRN
  Administered 2019-11-23: 17 g via ORAL
  Filled 2019-11-19: qty 1

## 2019-11-19 MED ORDER — LINAGLIPTIN 5 MG PO TABS
5.0000 mg | ORAL_TABLET | Freq: Every day | ORAL | Status: DC
  Administered 2019-11-19 – 2019-11-23 (×5): 5 mg via ORAL
  Filled 2019-11-19 (×5): qty 1

## 2019-11-19 MED ORDER — LEVOTHYROXINE SODIUM 50 MCG PO TABS
75.0000 ug | ORAL_TABLET | Freq: Every day | ORAL | Status: DC
  Administered 2019-11-19 – 2019-11-24 (×6): 75 ug via ORAL
  Filled 2019-11-19 (×5): qty 1

## 2019-11-19 MED ORDER — DEXAMETHASONE SODIUM PHOSPHATE 10 MG/ML IJ SOLN
6.0000 mg | INTRAMUSCULAR | Status: DC
  Administered 2019-11-19: 6 mg via INTRAVENOUS
  Filled 2019-11-19: qty 1

## 2019-11-19 MED ORDER — GLUCOSAMINE-CHONDROITIN 250-200 MG PO TABS: ORAL_TABLET | Freq: Two times a day (BID) | ORAL | Status: DC

## 2019-11-19 MED ORDER — COENZYME Q10 200 MG PO CAPS: 200.0000 mg | ORAL_CAPSULE | Freq: Every day | ORAL | Status: DC

## 2019-11-19 MED ORDER — FUROSEMIDE 20 MG PO TABS
20.0000 mg | ORAL_TABLET | Freq: Every morning | ORAL | Status: DC
  Administered 2019-11-19 – 2019-11-21 (×3): 20 mg via ORAL
  Filled 2019-11-19 (×4): qty 1

## 2019-11-19 MED ORDER — ONDANSETRON HCL 4 MG PO TABS: 4.0000 mg | ORAL_TABLET | Freq: Four times a day (QID) | ORAL | Status: DC | PRN

## 2019-11-19 MED ORDER — OXYCODONE HCL 5 MG PO TABS
5.0000 mg | ORAL_TABLET | ORAL | Status: DC | PRN
  Administered 2019-11-21 – 2019-11-22 (×2): 5 mg via ORAL
  Filled 2019-11-19 (×3): qty 1

## 2019-11-19 MED ORDER — TAMSULOSIN HCL 0.4 MG PO CAPS
0.4000 mg | ORAL_CAPSULE | Freq: Every day | ORAL | Status: DC
  Administered 2019-11-19 – 2019-11-23 (×5): 0.4 mg via ORAL
  Filled 2019-11-19 (×5): qty 1

## 2019-11-19 MED ORDER — ACETAMINOPHEN 325 MG PO TABS
650.0000 mg | ORAL_TABLET | Freq: Four times a day (QID) | ORAL | Status: DC | PRN
  Administered 2019-11-23: 650 mg via ORAL
  Filled 2019-11-19: qty 2

## 2019-11-19 MED ORDER — INSULIN ASPART 100 UNIT/ML ~~LOC~~ SOLN
0.0000 [IU] | Freq: Three times a day (TID) | SUBCUTANEOUS | Status: DC
  Administered 2019-11-19 (×3): 5 [IU] via SUBCUTANEOUS
  Administered 2019-11-20 – 2019-11-21 (×2): 2 [IU] via SUBCUTANEOUS
  Administered 2019-11-21: 5 [IU] via SUBCUTANEOUS
  Administered 2019-11-22: 2 [IU] via SUBCUTANEOUS
  Administered 2019-11-22: 3 [IU] via SUBCUTANEOUS
  Administered 2019-11-22: 5 [IU] via SUBCUTANEOUS
  Administered 2019-11-23 – 2019-11-24 (×2): 2 [IU] via SUBCUTANEOUS
  Administered 2019-11-24: 3 [IU] via SUBCUTANEOUS
  Administered 2019-11-24: 1 [IU] via SUBCUTANEOUS
  Filled 2019-11-19: qty 0.09

## 2019-11-19 MED ORDER — METOPROLOL TARTRATE 50 MG PO TABS
50.0000 mg | ORAL_TABLET | Freq: Two times a day (BID) | ORAL | Status: DC
  Administered 2019-11-19 – 2019-11-23 (×8): 50 mg via ORAL
  Filled 2019-11-19: qty 1
  Filled 2019-11-19: qty 2
  Filled 2019-11-19: qty 1
  Filled 2019-11-19: qty 2
  Filled 2019-11-19 (×2): qty 1
  Filled 2019-11-19 (×3): qty 2

## 2019-11-19 MED ORDER — FINASTERIDE 5 MG PO TABS
5.0000 mg | ORAL_TABLET | Freq: Every day | ORAL | Status: DC
  Administered 2019-11-20 – 2019-11-23 (×4): 5 mg via ORAL
  Filled 2019-11-19 (×4): qty 1

## 2019-11-19 MED ORDER — SODIUM CHLORIDE 0.9% FLUSH
3.0000 mL | Freq: Two times a day (BID) | INTRAVENOUS | Status: DC
  Administered 2019-11-19 – 2019-11-24 (×11): 3 mL via INTRAVENOUS

## 2019-11-19 MED ORDER — AMLODIPINE BESYLATE 10 MG PO TABS
10.0000 mg | ORAL_TABLET | Freq: Every day | ORAL | Status: DC
  Administered 2019-11-20 – 2019-11-21 (×2): 10 mg via ORAL
  Filled 2019-11-19 (×2): qty 2

## 2019-11-19 MED ORDER — ATORVASTATIN CALCIUM 40 MG PO TABS
40.0000 mg | ORAL_TABLET | Freq: Every day | ORAL | Status: DC
  Administered 2019-11-19 – 2019-11-22 (×4): 40 mg via ORAL
  Filled 2019-11-19 (×4): qty 1

## 2019-11-19 MED ORDER — FLUTICASONE FUROATE-VILANTEROL 100-25 MCG/INH IN AEPB: 1.0000 | INHALATION_SPRAY | Freq: Every day | RESPIRATORY_TRACT | Status: DC

## 2019-11-19 MED ORDER — ONDANSETRON HCL 4 MG/2ML IJ SOLN
4.0000 mg | Freq: Four times a day (QID) | INTRAMUSCULAR | Status: DC | PRN
  Administered 2019-11-21: 4 mg via INTRAVENOUS
  Filled 2019-11-19: qty 2

## 2019-11-19 MED ORDER — IPRATROPIUM-ALBUTEROL 20-100 MCG/ACT IN AERS
1.0000 | INHALATION_SPRAY | Freq: Four times a day (QID) | RESPIRATORY_TRACT | Status: DC | PRN
  Administered 2019-11-22: 1 via RESPIRATORY_TRACT
  Filled 2019-11-19: qty 4

## 2019-11-19 MED ORDER — ADULT MULTIVITAMIN W/MINERALS CH
1.0000 | ORAL_TABLET | Freq: Every day | ORAL | Status: DC
  Administered 2019-11-20 – 2019-11-23 (×3): 1 via ORAL
  Filled 2019-11-19 (×4): qty 1

## 2019-11-19 MED ORDER — CALCIUM GLUCONATE-NACL 1-0.675 GM/50ML-% IV SOLN
1.0000 g | Freq: Once | INTRAVENOUS | Status: AC
  Administered 2019-11-19: 1000 mg via INTRAVENOUS
  Filled 2019-11-19: qty 50

## 2019-11-19 MED ORDER — PANTOPRAZOLE SODIUM 40 MG PO TBEC
40.0000 mg | DELAYED_RELEASE_TABLET | Freq: Every day | ORAL | Status: DC
  Administered 2019-11-19 – 2019-11-23 (×5): 40 mg via ORAL
  Filled 2019-11-19 (×5): qty 1

## 2019-11-19 MED ORDER — INSULIN DETEMIR 100 UNIT/ML ~~LOC~~ SOLN
10.0000 [IU] | Freq: Every day | SUBCUTANEOUS | Status: DC
  Administered 2019-11-19 – 2019-11-24 (×6): 10 [IU] via SUBCUTANEOUS
  Filled 2019-11-19 (×6): qty 0.1

## 2019-11-19 MED ORDER — OMEGA-3-ACID ETHYL ESTERS 1 G PO CAPS
1.0000 g | ORAL_CAPSULE | Freq: Two times a day (BID) | ORAL | Status: DC
  Administered 2019-11-19 – 2019-11-22 (×6): 1 g via ORAL
  Filled 2019-11-19 (×8): qty 1

## 2019-11-19 MED ORDER — OMEGA-3 FATTY ACIDS 1000 MG PO CAPS: 1.0000 g | ORAL_CAPSULE | Freq: Two times a day (BID) | ORAL | Status: DC

## 2019-11-19 MED ORDER — TRAZODONE HCL 50 MG PO TABS
50.0000 mg | ORAL_TABLET | Freq: Every evening | ORAL | Status: DC | PRN
  Administered 2019-11-21: 50 mg via ORAL
  Filled 2019-11-19: qty 1

## 2019-11-19 NOTE — TOC Initial Note (Signed)
Transition of Care [facility]) - Initial/Assessment Note    Patient Details  Name: Gerald Rosario MRN: [id number] Date of Birth: 1946-02-16  [facility] CM/SW Contact:    Erenest Rasher, RN Phone Number: [phone] 11/19/2019, 4:09 PM  Clinical Narrative:                  TOC CM spoke to pt's POA, Gerald Rosario. States pt was at [place] [facility], and he wants pt to go to a [place] SNF. Gave permission for CM to create FL2 and send referral out to [place] for SNF placement. Spoke to [place], Admission Coordinator with [facility]. And they are holding his quarantine bed. Contacted [place] and Spoke to [place]. States they are not accepting anyone in [place] SNF facilities. They are contracting out to SNF facilities in the community. States she Gerald call POA, Gerald to discuss the process with him. Explained at time of dc wanted to have facility available to reduce extended stay in hospital waiting for SNF bed. Attending contacted for PT evaluation.      Expected Discharge Plan: [place] Barriers to Discharge: Continued Medical Work up   Patient Goals and CMS Choice Patient states their goals for this hospitalization and ongoing recovery are:: wants him to go to a [place] SNF CMS Medicare.gov Compare Post Acute Care list provided to:: Patient Represenative (must comment) ([facility] POA) Choice offered to / list presented to : [facility] POA / Guardian  Expected Discharge Plan and Services Expected Discharge Plan: [place] In-house Referral: Clinical Social Work Discharge Planning Services: CM Consult Post Acute Care Choice: [place] arrangements for the past 2 months: [place]                                      Prior Living Arrangements/Services Living arrangements for the past 2 months: [place] Lives with:: Facility Resident Patient language and need for  interpreter reviewed:: Yes Do you feel safe going back to the place where you live?: Yes      Need for Family Participation in Patient Care: Yes (Comment) Care giver support system in place?: Yes (comment)   Criminal Activity/Legal Involvement Pertinent to Current Situation/Hospitalization: No - Comment as needed  Activities of Daily Living Home Assistive Devices/Equipment: Dentures (specify type) (lower partial) ADL Screening (condition at time of admission) Patient's cognitive ability adequate to safely complete daily activities?: Yes Is the patient deaf or have difficulty hearing?: Yes Does the patient have difficulty seeing, even when wearing glasses/contacts?: Yes Does the patient have difficulty concentrating, remembering, or making decisions?: No Patient able to express need for assistance with ADLs?: No Does the patient have difficulty dressing or bathing?: Yes Independently performs ADLs?: No Communication: Dependent Is this a change from baseline?: Pre-admission baseline Dressing (OT): Needs assistance Is this a change from baseline?: Pre-admission baseline Grooming: Needs assistance Is this a change from baseline?: Pre-admission baseline Feeding: Needs assistance Is this a change from baseline?: Pre-admission baseline Bathing: Needs assistance Is this a change from baseline?: Pre-admission baseline Toileting: Needs assistance Is this a change from baseline?: Pre-admission baseline In/Out Bed: Needs assistance Is this a change from baseline?: Pre-admission baseline Walks in Home: Dependent Is this a change from baseline?: Pre-admission baseline Does the patient have difficulty walking or climbing stairs?: Yes Weakness of Legs: Both  Weakness of Arms/Hands: Both  Permission Sought/Granted Permission sought to share information with : Case Manager, Customer service manager, PCP, Family Supports Permission granted to share information with : Yes, Verbal Permission  Granted  Share Information with NAME: Gerald Rosario POA  Permission granted to share info w AGENCY: SNF Rehab, [place] granted to share info w Relationship: POA  Permission granted to share info w Contact Information: [phone]  Emotional Assessment           Psych Involvement: No (comment)  Admission diagnosis:  Acute hypoxemic respiratory failure due to COVID-19 ([place]) [U07.1, J96.01] Patient Active Problem List   Diagnosis Date Noted  . Acute hypoxemic respiratory failure due to COVID-19 ([place]) 11/15/2019  . 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 Dysphagia due to recent stroke 10/02/2019  . Urinary tract infection 10/02/2019  . Agitation 10/02/2019  . AKI (acute kidney injury) ([place]) 10/02/2019  . Prostate cancer ([place]) 09/24/2019  . 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 Collapse 09/24/2019  . Weakness 09/24/2019  . Middle cerebral artery embolism, left 09/24/2019  . Peripheral neuropathy 03/21/2018  . Hammer toes of both feet 03/21/2018  . Lightheadedness 09/20/2017  . Hypothyroidism 09/19/2017  . Heel pain, chronic, right 03/09/2016  . GERD (gastroesophageal reflux disease) 08/23/2015  . Iron deficiency anemia 08/23/2015  . Mild persistent chronic asthma without complication 34/74/2595  . CAD (coronary artery disease)   . PAF (paroxysmal atrial fibrillation) ([place])   . History of amiodarone therapy 01/23/2014  . Non-STEMI (non-ST elevated myocardial infarction) ([place]) 01/23/2014  . 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 Pleural effusion 08/20/2013  . Syncope and collapse 08/20/2013  . Multiple fractures of ribs of left side 08/18/2013  . Chronic sinusitis 08/18/2013  . Enlarged prostate   . 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 Chronic systolic heart failure ([place]) 04/10/2011  . Hyperlipidemia 12/09/2010   . Ischemic cardiomyopathy 11/08/2010  . Diabetes mellitus ([place]) 11/08/2010  . Coronary artery disease 11/07/2010  . Extrinsic asthma 01/17/2010  . Essential hypertension 03/20/2007  . PULMONARY NODULE, RIGHT MIDDLE LOBE 03/20/2007   PCP:  Binnie Rail, MD Pharmacy:   [facility], [place] [address], [address] [place], [place] [place] [address] Phone: [phone] Fax: [place], [place] [phone] [place] [address] Phone: [phone] Fax: [phone]  [facility] [place], [place] [address] Phone: [phone] Fax: [phone]     Social Determinants of Health (SDOH) Interventions    Readmission Risk Interventions No flowsheet data found.

## 2019-11-19 NOTE — Progress Notes (Signed)
Inpatient Diabetes Program Recommendations  AACE/ADA: New Consensus Statement on Inpatient Glycemic Control (2015)  Target Ranges:  Prepandial:   less than 140 mg/dL      Peak postprandial:   less than 180 mg/dL (1-2 hours)      Critically ill patients:  140 - 180 mg/dL   Lab Results  Component Value Date   GLUCAP 276 (H) 11/19/2019   HGBA1C 6.2 (H) 09/25/2019    Review of Glycemic Control Results for Gerald Rosario, Gerald Rosario (MRN [id number]) as of 11/19/2019 13:47  Ref. Range 11/19/2019 08:21 11/19/2019 11:19  Glucose-Capillary Latest Ref Range: 70 - 99 mg/dL 268 (H) Novolog 5units 276 (H)   Diabetes history:  DM2  Outpatient Diabetes medications:  Glipizide 10 mg daily  Metformin 500 QHS Novolog SSI  Current orders for Inpatient glycemic control:  Trajenta 5 mg daily  Novolog 0-9 units tid Decadron 5 mg daily  Inpatient Diabetes Program Recommendations:     Lantus 10 units daily May need small dose of meal coverage as well.    Will continue to follow while inpatient.  Thank you, Reche Dixon, RN, BSN Diabetes Coordinator Inpatient Diabetes Program [phone] (team pager from 8a-5p)

## 2019-11-19 NOTE — Progress Notes (Signed)
PHARMACIST - PHYSICIAN ORDER COMMUNICATION  CONCERNING: P&T Medication Policy on Herbal Medications  DESCRIPTION:  This patient's order for:  Coenzyme Q10 and Glucosamine-Chondroitin has been noted.  This product(s) is classified as an "herbal" or natural product. Due to a lack of definitive safety studies or FDA approval, nonstandard manufacturing practices, plus the potential risk of unknown drug-drug interactions while on inpatient medications, the Pharmacy and Therapeutics Committee does not permit the use of "herbal" or natural products of this type within [facility].   ACTION TAKEN: The pharmacy department is unable to verify this order at this time and your patient has been informed of this safety policy. Please reevaluate patient's clinical condition at discharge and address if the herbal or natural product(s) should be resumed at that time.  Leone Haven, PharmD

## 2019-11-19 NOTE — ED Notes (Signed)
Pt tried his best to give urine but was not able to give urine

## 2019-11-19 NOTE — Progress Notes (Signed)
PROGRESS NOTE  Gerald Rosario  WNU:[id number] DOB: 31-Aug-1945 DOA: 11/19/2019 PCP: Binnie Rail, MD  Outpatient Specialists: Gerald Rosario Brief Narrative: Gerald Rosario is a 74 y.o. male with history of prostate cancer, CABG x3, paroxysmal A. fib, ischemic cardiomyopathy, hypertension, hyperlipidemia's, diabetes, who presents from skilled nursing facility with worsening hypoxia in the setting of known Covid infection.  Patient recently admitted to [facility] from June 23 to July 6 for acute stroke due to occlusion of left ICA and MCA.  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  He was discharged to a SNF ([facility]), he was made DNR prior to discharge.  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  Per ED providers discussion with power of attorney patient was diagnosed with Covid approximately 2 weeks ago at his SNF, started on oxygen several days ago although the exact timing is unclear and was also treated with antibiotics.  He did not receive any Covid vaccination.  Patient began to have escalating oxygen requirements which prompted transfer to the emergency department.   Review of outside records showed that he was given 5 days of p.o. Decadron, as well as amoxicillin and azithromycin while at the nursing home.  Arrival to the ED patient was initially hypoxic to the 80s but with then placed on 6 L of oxygen with improvement in saturation.  Labs notable for positive Covid test, elevated inflammatory markers, normal procalcitonin, CBC with elevated white count of 14 and improving anemia with hemoglobin 11.7, platelets of 496, as well as elevated lactic acid of 3.2  He was given IV Decadron remdesivir in the ED and admitted for further management of his acute hypoxemic respiratory failure secondary to Covid infection.  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 PCT negative. Lymphopenic with elevated CRP 13.5.  - Wean oxygen as tolerated. - Continue remdesivir x5 days - Continue steroids x10 days - Will discuss baricitinib with HCPOA if oxygen needs escalate.  - Encourage OOB, IS, FV, and awake proning if able - Continue airborne, contact precautions for 21 days from positive testing. - Monitor CMP and inflammatory markers   Chronic diastolic CHF (grade III diastolic dysfunction on echo June 2021), ICM, paroxysmal AFib, CAD s/p CABG: BNP 558, no significant volume overload clinically. - Continue lasix, metoprolol, anticoagulation.  - continue PT rehabilitation  Hyperkalemia:  - Lokelma. Calcium gluconate, continue insulins as below.   History of CVA with persistent aphasia:  - ASA, statin - Heart healthy and carb modified diet with nectar thick liquids  T2DM:  - Add basal and continue SSI. - Add linagliptin.    Hypertension:  - Continue amlodipine  Hyperlipidemia:  - Continue atorvastatin  Asthma: No wheezing.  -  Continue symbicort, combivent  BPH:  - Continue finasteride and tamsulosin  Hypothyroidism:  - Continue Synthroid  GERD:  - Continue PPI  DVT prophylaxis: Eliquis Code Status: DNR, POA Family Communication: None at bedside.  Disposition Plan:   Status is: Inpatient  Remains inpatient appropriate because:Inpatient level of care appropriate due to severity of illness   Dispo: The patient is from: SNF Per power of attorney he is very unhappy with care at current nursing home, consult transitions of care for dispo planning to alternative facility              Anticipated d/c is to: SNF              Anticipated d/c date is: 3 days              Patient currently is not medically stable to d/c.  Consultants:   None  Procedures:   None  Antimicrobials:  Remdesivir   Subjective: Answers questions inappropriately, "50/50." No events overnight reported by RN.  Objective: Vitals:   11/19/19 1700 11/19/19 1730 11/19/19 1800 11/19/19 1830  BP: (!) 115/59 112/64 110/63 (!) 98/58  Pulse: 84 85 81 76  Resp: (!) 21 (!) 23 (!) 24 (!) 27  Temp:      TempSrc:      SpO2: 91% 92% 92% 97%    Intake/Output Summary (Last 24 hours) at 11/19/2019 1843 Last data filed at 11/19/2019 1400 Gross per 24 hour  Intake 1110 ml  Output 1000 ml  Net 110 ml   There were no vitals filed for this visit.  Gen: 74 y.o. male in no distress Pulm: Non-labored breathing supplemental oxygen. Clear to auscultation bilaterally.  CV: Regular rate and rhythm. No murmur, rub, or gallop. No JVD, no pitting pedal edema. GI: Abdomen soft, non-tender, non-distended, with normoactive bowel sounds. No organomegaly or masses felt. Ext: Warm, no deformities Skin: No rashes, lesions or ulcers Neuro: Alert, aphasic. Moves all extremities. Does not follow commands. Psych: UTD.  Data Reviewed: I have personally reviewed following labs and imaging studies  CBC: Recent Labs  Lab 11/04/2019 1829 11/19/19 0400  WBC 14.0* 6.5  NEUTROABS 12.7* 5.9  HGB 11.7* 10.2*  HCT 35.6* 32.1*  MCV 88.1 88.9  PLT 496* 944*   Basic Metabolic Panel: Recent Labs  Lab 11/13/2019 1829 11/19/19 0400  NA 135 138  K 4.5 5.4*  CL 104 107  CO2 21* 23  GLUCOSE 139* 326*  BUN  31* 33*  CREATININE 1.09 0.95  CALCIUM 9.7 9.4  MG  --  2.3  PHOS  --  3.7   GFR: CrCl cannot be calculated (Unknown ideal weight.). Liver Function Tests: Recent Labs  Lab 11/20/2019 1829 11/19/19 0400  AST 35 24  ALT 28 24  ALKPHOS 69 58  BILITOT 1.1 0.7  PROT 7.8 6.7  ALBUMIN 3.2* 2.7*   No results for input(s): LIPASE, AMYLASE in the last 168 hours. No results for input(s): AMMONIA in the last 168 hours. Coagulation Profile: No results for input(s): INR, PROTIME in the last 168 hours. Cardiac Enzymes: No results for input(s): CKTOTAL, CKMB, CKMBINDEX, TROPONINI in the last 168 hours. BNP (last 3 results) No results for input(s): PROBNP in the last 8760 hours. HbA1C: No results for input(s): HGBA1C in the last 72 hours. CBG: Recent Labs  Lab 11/19/19 0821 11/19/19 1119 11/19/19 1655  GLUCAP 268* 276* 283*   Lipid Profile: Recent Labs    11/05/2019 1803  TRIG 95  Thyroid Function Tests: No results for input(s): TSH, T4TOTAL, FREET4, T3FREE, THYROIDAB in the last 72 hours. Anemia Panel: Recent Labs    11/08/2019 1803 11/19/19 0400  FERRITIN 1,091* 741*   Urine analysis:    Component Value Date/Time   COLORURINE YELLOW 09/28/2019 1524   APPEARANCEUR CLOUDY (A) 09/28/2019 1524   LABSPEC 1.008 09/28/2019 1524   PHURINE 6.0 09/28/2019 1524   GLUCOSEU >=500 (A) 09/28/2019 1524   HGBUR SMALL (A) 09/28/2019 1524   BILIRUBINUR NEGATIVE 09/28/2019 [place] 09/28/2019 1524   PROTEINUR NEGATIVE 09/28/2019 1524   UROBILINOGEN 1.0 09/02/2013 1838   NITRITE NEGATIVE 09/28/2019 1524   LEUKOCYTESUR LARGE (A) 09/28/2019 1524   Recent Results (from the past 240 hour(s))  Blood Culture (routine x 2)     Status: None (Preliminary result)   Collection Time: 11/16/2019  4:43 PM   Specimen: BLOOD  Result Value Ref Range Status   Specimen Description   Final    BLOOD RIGHT ANTECUBITAL Performed at [facility], [place] [address], [place], [place]  [address]    Special Requests   Final    BOTTLES DRAWN AEROBIC AND ANAEROBIC Blood Culture adequate volume Performed at [facility] [address], [place], [place] [address]    Culture   Final    NO GROWTH < 12 HOURS Performed at [facility] [address] [place], [place] [address]    Report Status PENDING  Incomplete  SARS Coronavirus 2 by RT PCR (hospital order, performed in [facility] hospital lab) Nasopharyngeal Nasopharyngeal Swab     Status: Abnormal   Collection Time: 11/26/2019  6:26 PM   Specimen: Nasopharyngeal Swab  Result Value Ref Range Status   SARS Coronavirus 2 POSITIVE (A) NEGATIVE Final    Comment: RESULT CALLED TO, READ BACK BY AND VERIFIED WITH: BROOKS,B. RN @1924  11/21/2019 BILLINGSLEY,L (NOTE) SARS-CoV-2 target nucleic acids are DETECTED  SARS-CoV-2 RNA is generally detectable in upper respiratory specimens  during the acute phase of infection.  Positive results are indicative  of the presence of the identified virus, but do not rule out bacterial infection or co-infection with other pathogens not detected by the test.  Clinical correlation with patient history and  other diagnostic information is necessary to determine patient infection status.  The expected result is negative.  Fact Sheet for Patients:   StrictlyIdeas.no   Fact Sheet for Healthcare Providers:   BankingDealers.co.za    This test is not yet approved or cleared by the [place] FDA and  has been authorized for detection and/or diagnosis of SARS-CoV-2 by FDA under an Emergency Use Authorization (EUA).  This EUA will remain in effect (meaning t his test can be used) for the duration of  the COVID-19 declaration under Section 564(b)(1) of the Act, 21 U.S.C. section 360-bbb-3(b)(1), unless the authorization is terminated or revoked sooner.  Performed at [facility], [place] [address] [place], [place] [address]   Blood Culture (routine x 2)     Status: None (Preliminary result)   Collection Time: 11/07/2019  6:29 PM   Specimen: BLOOD  Result Value Ref Range Status   Specimen Description   Final    BLOOD LEFT ANTECUBITAL Performed at [facility], [place] [address], [place], [place] [address]    Special Requests   Final    BOTTLES DRAWN AEROBIC AND ANAEROBIC Blood Culture adequate volume Performed at [facility] [address]  [place], [place] [address]    Culture   Final    NO GROWTH < 12 HOURS Performed at [facility] [address] [place], [place] [address]    Report Status PENDING  Incomplete      Radiology Studies: DG Chest Port 1 View  Result Date: 11/20/2019 CLINICAL DATA:  Hypoxia, COVID-19 positive 2 weeks ago EXAM: PORTABLE CHEST 1 VIEW COMPARISON:  01/23/2014 FINDINGS: Single frontal view of the chest demonstrates postsurgical changes from prior CABG. Cardiac silhouette is unremarkable. There is multifocal bilateral interstitial and ground-glass opacity, with relative sparing of the left apex. No effusion or pneumothorax. No acute bony abnormalities. IMPRESSION: 1. Findings most consistent with multifocal COVID-19 pneumonia. Electronically Signed   By: Randa Ngo M.D.   On: 11/27/2019 19:44    Scheduled Meds:  amLODipine  10 mg Oral Daily   apixaban  5 mg Oral BID   ascorbic acid  500 mg Oral Daily   atorvastatin  40 mg Oral QHS   dexamethasone (DECADRON) injection  6 mg Intravenous Q24H   finasteride  5 mg Oral Daily   [START ON 11/20/2019] fluticasone furoate-vilanterol  1 puff Inhalation Daily   furosemide  20 mg Oral q AM   insulin aspart  0-9 Units Subcutaneous TID WC   Ipratropium-Albuterol  1 puff Inhalation Q6H   levothyroxine  75 mcg Oral QAC breakfast   linagliptin  5 mg Oral Daily   metoprolol tartrate  50 mg Oral BID   multivitamin with minerals  1 tablet Oral Daily   omega-3 acid  ethyl esters  1 g Oral BID   pantoprazole  40 mg Oral QAC breakfast   sodium chloride flush  3 mL Intravenous Q12H   tamsulosin  0.4 mg Oral Daily   Continuous Infusions:  remdesivir 100 mg in NS 100 mL Stopped (11/19/19 1200)     LOS: 1 day   Time spent: 35 minutes.  Patrecia Pour, MD [facility] [email or web address] 11/19/2019, 6:43 PM

## 2019-11-20 ENCOUNTER — Inpatient Hospital Stay (HOSPITAL_COMMUNITY): Payer: No Typology Code available for payment source

## 2019-11-20 LAB — COMPREHENSIVE METABOLIC PANEL
ALT: 23 U/L (ref 0–44)
AST: 22 U/L (ref 15–41)
Albumin: 2.7 g/dL — ABNORMAL LOW (ref 3.5–5.0)
Alkaline Phosphatase: 59 U/L (ref 38–126)
Anion gap: 8 (ref 5–15)
BUN: 31 mg/dL — ABNORMAL HIGH (ref 8–23)
CO2: 23 mmol/L (ref 22–32)
Calcium: 9.8 mg/dL (ref 8.9–10.3)
Chloride: 106 mmol/L (ref 98–111)
Creatinine, Ser: 0.85 mg/dL (ref 0.61–1.24)
GFR calc Af Amer: >60 mL/min (ref >60)
GFR calc non Af Amer: >60 mL/min (ref >60)
Glucose, Bld: 228 mg/dL — ABNORMAL HIGH (ref 70–99)
Potassium: 4.5 mmol/L (ref 3.5–5.1)
Sodium: 137 mmol/L (ref 135–145)
Total Bilirubin: 1.2 mg/dL (ref 0.3–1.2)
Total Protein: 6.6 g/dL (ref 6.5–8.1)

## 2019-11-20 LAB — CBC WITH DIFFERENTIAL/PLATELET
Abs Immature Granulocytes: 0.15 10*3/uL — ABNORMAL HIGH (ref 0.00–0.07)
Basophils Absolute: 0 10*3/uL (ref 0.0–0.1)
Basophils Relative: 0 %
Eosinophils Absolute: 0 10*3/uL (ref 0.0–0.5)
Eosinophils Relative: 0 %
HCT: 31.9 % — ABNORMAL LOW (ref 39.0–52.0)
Hemoglobin: 10.4 g/dL — ABNORMAL LOW (ref 13.0–17.0)
Immature Granulocytes: 1 %
Lymphocytes Relative: 2 %
Lymphs Abs: 0.3 10*3/uL — ABNORMAL LOW (ref 0.7–4.0)
MCH: 29 pg (ref 26.0–34.0)
MCHC: 32.6 g/dL (ref 30.0–36.0)
MCV: 88.9 fL (ref 80.0–100.0)
Monocytes Absolute: 0.7 10*3/uL (ref 0.1–1.0)
Monocytes Relative: 4 %
Neutro Abs: 15.7 10*3/uL — ABNORMAL HIGH (ref 1.7–7.7)
Neutrophils Relative %: 93 %
Platelets: 375 10*3/uL (ref 150–400)
RBC: 3.59 MIL/uL — ABNORMAL LOW (ref 4.22–5.81)
RDW: 13.9 % (ref 11.5–15.5)
WBC: 17 10*3/uL — ABNORMAL HIGH (ref 4.0–10.5)
nRBC: 0 % (ref 0.0–0.2)

## 2019-11-20 LAB — CBG MONITORING, ED
Glucose-Capillary: 176 mg/dL — ABNORMAL HIGH (ref 70–99)
Glucose-Capillary: 151 mg/dL — ABNORMAL HIGH (ref 70–99)
Glucose-Capillary: 166 mg/dL — ABNORMAL HIGH (ref 70–99)

## 2019-11-20 LAB — C-REACTIVE PROTEIN: CRP: 11.1 mg/dL — ABNORMAL HIGH (ref <1.0)

## 2019-11-20 LAB — PHOSPHORUS: Phosphorus: 3.4 mg/dL (ref 2.5–4.6)

## 2019-11-20 LAB — D-DIMER, QUANTITATIVE: D-Dimer, Quant: 5.28 ug/mL-FEU — ABNORMAL HIGH (ref 0.00–0.50)

## 2019-11-20 LAB — FERRITIN: Ferritin: 745 ng/mL — ABNORMAL HIGH (ref 24–336)

## 2019-11-20 LAB — MAGNESIUM: Magnesium: 2.1 mg/dL (ref 1.7–2.4)

## 2019-11-20 MED ORDER — BARICITINIB 2 MG PO TABS
4.0000 mg | ORAL_TABLET | Freq: Every day | ORAL | Status: DC
  Administered 2019-11-20 – 2019-11-21 (×2): 4 mg via ORAL
  Filled 2019-11-20 (×3): qty 2

## 2019-11-20 MED ORDER — METHYLPREDNISOLONE SODIUM SUCC 125 MG IJ SOLR
80.0000 mg | Freq: Once | INTRAMUSCULAR | Status: AC
  Administered 2019-11-20: 80 mg via INTRAVENOUS
  Filled 2019-11-20: qty 2

## 2019-11-20 MED ORDER — METHYLPREDNISOLONE SODIUM SUCC 125 MG IJ SOLR
60.0000 mg | Freq: Two times a day (BID) | INTRAMUSCULAR | Status: DC
  Administered 2019-11-20 – 2019-11-23 (×6): 60 mg via INTRAVENOUS
  Filled 2019-11-20 (×7): qty 2

## 2019-11-20 NOTE — Progress Notes (Signed)
PROGRESS NOTE  Gerald Rosario  PYK:[id number] DOB: Mar 29, 1946 DOA: 11/02/2019 PCP: Binnie Rail, MD  Outpatient Specialists: [place] Brief Narrative: Gerald Rosario is a 74 y.o. male with history of prostate cancer, CABG x3, paroxysmal A. fib, ischemic cardiomyopathy, hypertension, hyperlipidemia's, diabetes, who presents from skilled nursing facility with worsening hypoxia in the setting of known Covid infection.  Patient recently admitted to [facility] from June 23 to July 6 for acute stroke due to occlusion of left ICA and MCA.  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  He was discharged to a SNF ([facility]), he was made DNR prior to discharge.  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  Per ED providers discussion with power of attorney patient was diagnosed with Covid approximately 2 weeks ago at his SNF, started on oxygen several days ago although the exact timing is unclear and was also treated with antibiotics.  He did not receive any Covid vaccination.  Patient began to have escalating oxygen requirements which prompted transfer to the emergency department.   Review of outside records showed that he was given 5 days of p.o. Decadron, as well as amoxicillin and azithromycin while at the nursing home.  Arrival to the ED patient was initially hypoxic to the 80s but with then placed on 6 L of oxygen with improvement in saturation.  Labs notable for positive Covid test, elevated inflammatory markers, normal procalcitonin, CBC with elevated white count of 14 and improving anemia with hemoglobin 11.7, platelets of 496, as well as elevated lactic acid of 3.2  He was given IV Decadron remdesivir in the ED and admitted for further management of his acute hypoxemic respiratory failure secondary to Covid infection.  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 PCT negative. Lymphopenic with elevated CRP 13.5.  - Wean oxygen as tolerated, unfortunately now up to 10LPM. With severity and significant number of comorbidities, a poor prognosis is expected. Palliative care is consulted. DNR is confirmed.  - Continue remdesivir x5 days - Continue steroids, will augment dosing with CRP showing minimal response thus far and increasing hypoxemia. Check CXR. - Based on ACTT-2 and COV-BARRIER trials and other available data, baricitinib is being used under EUA by the FDA. The patient has no ESRD or AKI, known history of TB, severe neutropenia (ANC <500) or lymphopenia (ALC <200), or severe LFT elevations. They are not on DMARDs. The option to use/refuse baricitinib treatment under FDA authorization (not approval), the significant known and potential risks and benefits, the extent to which these are unknown, and information regarding all available alternatives were discussed in detail. Specifically the risk of VTE and secondary infections were discussed in detail with the patient and/or HCPOA.  They consent to proceed with treatment.  - D-dimer noted to have risen. Will continue full dose eliquis at this time.  - Encourage OOB, IS, FV, and awake proning if able - Continue airborne, contact precautions  for 21 days from positive testing. - Monitor CMP and inflammatory markers   Chronic diastolic CHF (grade III diastolic dysfunction on echo June 2021), ICM, paroxysmal AFib, CAD s/p CABG: BNP 558, no significant volume overload clinically. - Continue lasix, metoprolol, anticoagulation.  - Continue PT rehabilitation  Hyperkalemia: Resolved.  History of CVA with persistent aphasia:  - ASA, statin - Heart healthy and carb modified diet with nectar thick liquids  Prostate CA: Dr. Alvy Bimler reached out as she was reviewing outpatient referrals. Will need to delay this at this time.  T2DM:  - Add basal and continue SSI. - Add linagliptin.    Hypertension:  - Continue amlodipine  Hyperlipidemia:  - Continue atorvastatin  Asthma: No wheezing.  - Continue symbicort, combivent  BPH:  - Continue finasteride and tamsulosin  Hypothyroidism:  - Continue Synthroid  GERD:  - Continue PPI  DVT prophylaxis: Eliquis Code Status: DNR, POA Family Communication: HCPOA by phone at length this evening.   Disposition Plan:  Status is: Inpatient  Remains inpatient appropriate because:Inpatient level of care appropriate due to severity of illness   Dispo: The patient is from: SNF Per power of attorney he is very unhappy with care at current nursing home, consult transitions of care for dispo planning to alternative facility              Anticipated d/c is to: SNF              Anticipated d/c date is: 3 days              Patient currently is not medically stable to d/c.  Consultants:   None  Procedures:   None  Antimicrobials:  Remdesivir   Subjective: No changes per pt, answers all questions with "50/50." Having to increase oxygen this morning.   Objective: Vitals:   11/20/19 1130 11/20/19 1200 11/20/19 1210 11/20/19 1248  BP: (!) 130/56 (!) 108/50 (!) 108/50 (!) 110/52  Pulse: (!) 58 63 67 68  Resp: 13 (!) 21 (!) 23 (!) 22  Temp:      TempSrc:      SpO2: 100% 94% 94% 95%     Intake/Output Summary (Last 24 hours) at 11/20/2019 1340 Last data filed at 11/20/2019 1036 Gross per 24 hour  Intake 440.62 ml  Output 600 ml  Net -159.38 ml   Gen: Older male in no distress Pulm: Nonlabored breathing supplemental oxygen. Crackles at bases, minimal. CV: Regular rate and rhythm. No murmur, rub, or gallop. No JVD, no dependent edema. GI: Abdomen soft, non-tender, non-distended, with normoactive bowel sounds.  Ext: Warm, no deformities Skin: No new rashes, lesions or ulcers on visualized skin. Neuro: Alert, does not follow commands but actively moves all extremities. Psych: UTD.  Data Reviewed: I have personally reviewed following labs and imaging studies  CBC: Recent Labs  Lab 11/26/2019 1829 11/19/19 0400 11/20/19 0541  WBC 14.0* 6.5 17.0*  NEUTROABS 12.7* 5.9 15.7*  HGB 11.7* 10.2* 10.4*  HCT 35.6* 32.1* 31.9*  MCV 88.1 88.9 88.9  PLT 496* 409* 601   Basic Metabolic Panel: Recent Labs  Lab 11/29/2019 1829 11/19/19 0400 11/20/19 0541  NA 135 138 137  K 4.5 5.4* 4.5  CL 104 107 106  CO2 21* 23 23  GLUCOSE  139* 326* 228*  BUN 31* 33* 31*  CREATININE 1.09 0.95 0.85  CALCIUM 9.7 9.4 9.8  MG  --  2.3 2.1  PHOS  --  3.7 3.4   GFR: CrCl cannot be calculated (Unknown ideal weight.). Liver Function Tests: Recent Labs  Lab 11/28/2019 1829 11/19/19 0400 11/20/19 0541  AST 35 24 22  ALT 28 24 23   ALKPHOS 69 58 59  BILITOT 1.1 0.7 1.2  PROT 7.8 6.7 6.6  ALBUMIN 3.2* 2.7* 2.7*   No results for input(s): LIPASE, AMYLASE in the last 168 hours. No results for input(s): AMMONIA in the last 168 hours. Coagulation Profile: No results for input(s): INR, PROTIME in the last 168 hours. Cardiac Enzymes: No results for input(s): CKTOTAL, CKMB, CKMBINDEX, TROPONINI in the last 168 hours. BNP (last 3 results) No results for input(s): PROBNP in the last 8760 hours. HbA1C: No results for input(s): HGBA1C in the last 72 hours. CBG: Recent Labs  Lab  11/19/19 1119 11/19/19 1655 11/19/19 2216 11/20/19 0753 11/20/19 1209  GLUCAP 276* 283* 148* 176* 151*   Lipid Profile: Recent Labs    11/21/2019 1803  TRIG 95   Thyroid Function Tests: No results for input(s): TSH, T4TOTAL, FREET4, T3FREE, THYROIDAB in the last 72 hours. Anemia Panel: Recent Labs    11/19/19 0400 11/20/19 0541  FERRITIN 741* 745*   Urine analysis:    Component Value Date/Time   COLORURINE YELLOW 09/28/2019 1524   APPEARANCEUR CLOUDY (A) 09/28/2019 1524   LABSPEC 1.008 09/28/2019 1524   PHURINE 6.0 09/28/2019 1524   GLUCOSEU >=500 (A) 09/28/2019 1524   HGBUR SMALL (A) 09/28/2019 1524   BILIRUBINUR NEGATIVE 09/28/2019 [place] 09/28/2019 1524   PROTEINUR NEGATIVE 09/28/2019 1524   UROBILINOGEN 1.0 09/02/2013 1838   NITRITE NEGATIVE 09/28/2019 1524   LEUKOCYTESUR LARGE (A) 09/28/2019 1524   Recent Results (from the past 240 hour(s))  Blood Culture (routine x 2)     Status: None (Preliminary result)   Collection Time: 11/15/2019  4:43 PM   Specimen: BLOOD  Result Value Ref Range Status   Specimen Description   Final    BLOOD RIGHT ANTECUBITAL Performed at [facility], [place] [address], [place], [place] [address]    Special Requests   Final    BOTTLES DRAWN AEROBIC AND ANAEROBIC Blood Culture adequate volume Performed at [place] [address], [place], [place] [address]    Culture   Final    NO GROWTH 2 DAYS Performed at [place] [address] [place], [place] [address]    Report Status PENDING  Incomplete  SARS Coronavirus 2 by RT PCR (hospital order, performed in [facility] hospital lab) Nasopharyngeal Nasopharyngeal Swab     Status: Abnormal   Collection Time: 11/05/2019  6:26 PM   Specimen: Nasopharyngeal Swab  Result Value Ref Range Status   SARS Coronavirus 2 POSITIVE (A) NEGATIVE Final    Comment: RESULT CALLED TO, READ BACK BY AND VERIFIED WITH: BROOKS,B. RN @1924  11/21/2019  BILLINGSLEY,L (NOTE) SARS-CoV-2 target nucleic acids are DETECTED  SARS-CoV-2 RNA is generally detectable in upper respiratory specimens  during the acute phase of infection.  Positive results are indicative  of the presence of the identified virus, but do not rule out bacterial infection or co-infection with other pathogens not detected by the test.  Clinical correlation with patient history and  other diagnostic information is necessary to determine patient infection status.  The expected result  is negative.  Fact Sheet for Patients:   StrictlyIdeas.no   Fact Sheet for Healthcare Providers:   BankingDealers.co.za    This test is not yet approved or cleared by the [place] FDA and  has been authorized for detection and/or diagnosis of SARS-CoV-2 by FDA under an Emergency Use Authorization (EUA).  This EUA will remain in effect (meaning t his test can be used) for the duration of  the COVID-19 declaration under Section 564(b)(1) of the Act, 21 U.S.C. section 360-bbb-3(b)(1), unless the authorization is terminated or revoked sooner.  Performed at [facility], [place] [address], [place], [place] [address]   Blood Culture (routine x 2)     Status: None (Preliminary result)   Collection Time: 11/29/2019  6:29 PM   Specimen: BLOOD  Result Value Ref Range Status   Specimen Description   Final    BLOOD LEFT ANTECUBITAL Performed at [facility], [place] [address], [place], [place] [address]    Special Requests   Final    BOTTLES DRAWN AEROBIC AND ANAEROBIC Blood Culture adequate volume Performed at [place] [address] [place], [place] [address]    Culture   Final    NO GROWTH 2 DAYS Performed at [place] [address], [place], [place] [address]    Report Status PENDING  Incomplete      Radiology Studies: DG CHEST PORT 1 VIEW  Result Date: 11/20/2019 CLINICAL DATA:   COVID positive. EXAM: PORTABLE CHEST 1 VIEW COMPARISON:  11/24/2019. FINDINGS: Prior CABG. Cardiomegaly. Bilateral pulmonary infiltrates/edema again noted. Slight improvement in aeration from prior exam. No prominent pleural effusion. No pneumothorax. IMPRESSION: 1.  Prior CABG.  Cardiomegaly. 2. Bilateral pulmonary infiltrates/edema again noted. Slight improvement in aeration from prior exam. Electronically Signed   By: Marcello Moores  Register   On: 11/20/2019 09:34   DG Chest Port 1 View  Result Date: 11/24/2019 CLINICAL DATA:  Hypoxia, COVID-19 positive 2 weeks ago EXAM: PORTABLE CHEST 1 VIEW COMPARISON:  01/23/2014 FINDINGS: Single frontal view of the chest demonstrates postsurgical changes from prior CABG. Cardiac silhouette is unremarkable. There is multifocal bilateral interstitial and ground-glass opacity, with relative sparing of the left apex. No effusion or pneumothorax. No acute bony abnormalities. IMPRESSION: 1. Findings most consistent with multifocal COVID-19 pneumonia. Electronically Signed   By: Randa Ngo M.D.   On: 11/30/2019 19:44    Scheduled Meds: . amLODipine  10 mg Oral Daily  . apixaban  5 mg Oral BID  . ascorbic acid  500 mg Oral Daily  . atorvastatin  40 mg Oral QHS  . finasteride  5 mg Oral Daily  . fluticasone furoate-vilanterol  1 puff Inhalation Daily  . furosemide  20 mg Oral q AM  . insulin aspart  0-9 Units Subcutaneous TID WC  . insulin detemir  10 Units Subcutaneous Daily  . Ipratropium-Albuterol  1 puff Inhalation Q6H  . levothyroxine  75 mcg Oral QAC breakfast  . linagliptin  5 mg Oral Daily  . methylPREDNISolone (SOLU-MEDROL) injection  60 mg Intravenous Q12H  . metoprolol tartrate  50 mg Oral BID  . multivitamin with minerals  1 tablet Oral Daily  . omega-3 acid ethyl esters  1 g Oral BID  . pantoprazole  40 mg Oral QAC breakfast  . sodium chloride flush  3 mL Intravenous Q12H  . tamsulosin  0.4 mg Oral Daily   Continuous Infusions: . remdesivir 100  mg in NS 100 mL Stopped (  11/20/19 1031)     LOS: 2 days   Time spent: 35 minutes.  Patrecia Pour, MD [facility] [email or web address] 11/20/2019, 1:40 PM

## 2019-11-20 NOTE — ED Notes (Signed)
Patient refused insulin at lunch and dinner.  He can only say 50/50 but unable to know why he does not want it.  He pushed staff away, shakes head no and points finger.

## 2019-11-20 NOTE — Progress Notes (Signed)
PT Cancellation Note  Patient Details Name: Gerald Rosario MRN: [id number] DOB: 09-Apr-1945   Cancelled Treatment:    Reason Eval/Treat Not Completed: Medical issues which prohibited therapy, per MD, Palliative C/S is pending. Will follow for [place], return to SNF.    Claretha Cooper 11/20/2019, 11:38 AM [place] Pager [phone] Office [phone]

## 2019-11-20 NOTE — ED Notes (Signed)
Hospitalist paged to alert of trending DDIMER.

## 2019-11-20 NOTE — ED Notes (Signed)
With entering room, pt noted to be on 4 lpm [place]. Pt saturation initially 96% but while drawing labs and doing environmental safety check of room, pt saturation noted to be 87%. Pt placed on 6 lpm [place] and RT called to request evaluation.

## 2019-11-20 NOTE — Progress Notes (Signed)
OT Cancellation Note  Patient Details Name: Gerald Rosario MRN: [id number] DOB: January 20, 1946   Cancelled Treatment:    Reason Eval/Treat Not Completed: Medical issues which prohibited therapy. Spoke with MD, reports palliative consulted. Will continue to follow.   Gerald Rosario OT OT pager: [phone]  Gerald Rosario 11/20/2019, 10:58 AM

## 2019-11-20 NOTE — Progress Notes (Signed)
TOC CM spoke to pt's POA, Will and states he is wanting him to go to [place]. Received call from [facility] rep, Tammy to see if they need to continue to hold his bed. Attempted call to [place], [place] at [facility] and left HIPAA compliant message to follow up on alternate facility. Waiting Palliative Consult for Goals of Care. [place], [place] ED TOC CM [phone]

## 2019-11-20 NOTE — ED Notes (Signed)
RT en route to assess pt.

## 2019-11-21 DIAGNOSIS — Z7189 Other specified counseling: Secondary | ICD-10-CM

## 2019-11-21 DIAGNOSIS — R531 Weakness: Secondary | ICD-10-CM

## 2019-11-21 DIAGNOSIS — Z515 Encounter for palliative care: Secondary | ICD-10-CM

## 2019-11-21 LAB — CBC WITH DIFFERENTIAL/PLATELET
Abs Immature Granulocytes: 0.31 10*3/uL — ABNORMAL HIGH (ref 0.00–0.07)
Basophils Absolute: 0 10*3/uL (ref 0.0–0.1)
Basophils Relative: 0 %
Eosinophils Absolute: 0 10*3/uL (ref 0.0–0.5)
Eosinophils Relative: 0 %
HCT: 28.3 % — ABNORMAL LOW (ref 39.0–52.0)
Hemoglobin: 9.2 g/dL — ABNORMAL LOW (ref 13.0–17.0)
Immature Granulocytes: 1 %
Lymphocytes Relative: 1 %
Lymphs Abs: 0.3 10*3/uL — ABNORMAL LOW (ref 0.7–4.0)
MCH: 28.6 pg (ref 26.0–34.0)
MCHC: 32.5 g/dL (ref 30.0–36.0)
MCV: 87.9 fL (ref 80.0–100.0)
Monocytes Absolute: 1.3 10*3/uL — ABNORMAL HIGH (ref 0.1–1.0)
Monocytes Relative: 5 %
Neutro Abs: 26.1 10*3/uL — ABNORMAL HIGH (ref 1.7–7.7)
Neutrophils Relative %: 93 %
Platelets: 383 10*3/uL (ref 150–400)
RBC: 3.22 MIL/uL — ABNORMAL LOW (ref 4.22–5.81)
RDW: 13.9 % (ref 11.5–15.5)
WBC: 28 10*3/uL — ABNORMAL HIGH (ref 4.0–10.5)
nRBC: 0 % (ref 0.0–0.2)

## 2019-11-21 LAB — COMPREHENSIVE METABOLIC PANEL
ALT: 17 U/L (ref 0–44)
AST: 20 U/L (ref 15–41)
Albumin: 2.4 g/dL — ABNORMAL LOW (ref 3.5–5.0)
Alkaline Phosphatase: 56 U/L (ref 38–126)
Anion gap: 12 (ref 5–15)
BUN: 38 mg/dL — ABNORMAL HIGH (ref 8–23)
CO2: 22 mmol/L (ref 22–32)
Calcium: 9.7 mg/dL (ref 8.9–10.3)
Chloride: 104 mmol/L (ref 98–111)
Creatinine, Ser: 1.03 mg/dL (ref 0.61–1.24)
GFR calc Af Amer: >60 mL/min (ref >60)
GFR calc non Af Amer: >60 mL/min (ref >60)
Glucose, Bld: 144 mg/dL — ABNORMAL HIGH (ref 70–99)
Potassium: 4.9 mmol/L (ref 3.5–5.1)
Sodium: 138 mmol/L (ref 135–145)
Total Bilirubin: 1 mg/dL (ref 0.3–1.2)
Total Protein: 5.8 g/dL — ABNORMAL LOW (ref 6.5–8.1)

## 2019-11-21 LAB — GLUCOSE, CAPILLARY
Glucose-Capillary: 103 mg/dL — ABNORMAL HIGH (ref 70–99)
Glucose-Capillary: 151 mg/dL — ABNORMAL HIGH (ref 70–99)

## 2019-11-21 LAB — C-REACTIVE PROTEIN: CRP: 10.4 mg/dL — ABNORMAL HIGH (ref <1.0)

## 2019-11-21 LAB — D-DIMER, QUANTITATIVE: D-Dimer, Quant: 3.22 ug/mL-FEU — ABNORMAL HIGH (ref 0.00–0.50)

## 2019-11-21 LAB — CBG MONITORING, ED
Glucose-Capillary: 171 mg/dL — ABNORMAL HIGH (ref 70–99)
Glucose-Capillary: 252 mg/dL — ABNORMAL HIGH (ref 70–99)

## 2019-11-21 LAB — FERRITIN: Ferritin: 718 ng/mL — ABNORMAL HIGH (ref 24–336)

## 2019-11-21 LAB — MAGNESIUM: Magnesium: 2 mg/dL (ref 1.7–2.4)

## 2019-11-21 LAB — PHOSPHORUS: Phosphorus: 3.3 mg/dL (ref 2.5–4.6)

## 2019-11-21 MED ORDER — CHLORHEXIDINE GLUCONATE CLOTH 2 % EX PADS
6.0000 | MEDICATED_PAD | Freq: Every day | CUTANEOUS | Status: DC
  Administered 2019-11-21 – 2019-11-24 (×4): 6 via TOPICAL

## 2019-11-21 MED ORDER — FUROSEMIDE 10 MG/ML IJ SOLN
20.0000 mg | Freq: Once | INTRAMUSCULAR | Status: AC
  Administered 2019-11-21: 20 mg via INTRAVENOUS
  Filled 2019-11-21: qty 4

## 2019-11-21 NOTE — ED Notes (Signed)
PT at bedside.

## 2019-11-21 NOTE — ED Notes (Signed)
RN attempted to insert foley catheter x2, resistance met each time, unable to advance foley catheter.  Will attempt again with [place] foley catheter.

## 2019-11-21 NOTE — Evaluation (Signed)
Physical Therapy Evaluation Patient Details Name: Gerald Rosario MRN: [id number] DOB: [birth year] Today's Date: 11/21/2019   History of Present Illness  Gerald Rosario is a 74 y.o. male with history of prostate cancer, CABG x3, paroxysmal A. fib, ischemic cardiomyopathy, hypertension, hyperlipidemia's, diabetes, who presents from skilled nursing facility with worsening hypoxia in the setting of known Covid infection. Patient recently admitted to [facility] from June 23 to July 6 for acute stroke due to occlusion of left ICA and MCA.  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  Clinical Impression  Pt eval performed while pt in ED. Pt very motivated to work with PT and worked with sit to stand and pregait activitieswith weight shifting and small steps at EOB today. pt with expressive aphasia. Unclear of pt's PLOF at SNF after stroke and CIR completion. Pt's POA ( notes in chart) stated that prior to stroke pt very active walking and exercising several times a week. No family at this time, but best friend POA and friend in service.  Recommend continued PT at [facility] for feel pr will continue to prgress.  Pt was on 9 L HFNC and did get slight SOB and educated with persed lip breathing at EOB each time we sat for rest break.  Will continue to follow while here as well.     Follow Up Recommendations SNF    Equipment Recommendations  Other (comment)    Recommendations for Other Services       Precautions / Restrictions        Mobility  Bed Mobility Overal bed mobility: Needs Assistance Bed Mobility: Supine to Sit;Sit to Supine     Supine to sit: Min assist Sit to supine: Min assist   General bed mobility comments: just a little with LE and it ws difficult due to the small size of the stretcher.  Transfers Overall transfer level: Needs assistance Equipment used: 1 person hand held assist Transfers: Sit to/from Stand Sit to Stand: Min  assist         General transfer comment: worked on sit to stand, limited in ED room due to [place] and unable to bring in equipment just during this session. Worked at [employer] with B hand held assist at PT forearms , tried pre giat with side stepping, pt with difficulty progressing RLE , but feel would have improved if had another hand and /or more room to progress forward mobility.  Ambulation/Gait Ambulation/Gait assistance: Min assist Gait Distance (Feet): 5 Feet Assistive device: 1 person hand held assist       General Gait Details: . Worked at [employer] with B hand held assist at PT forearms , tried pre giat with side stepping, pt with difficulty progressing RLE , but feel would have improved if had another hand and /or more room to progress forward mobility.  Stairs            Wheelchair Mobility    Modified Rankin (Stroke Patients Only)       Balance Overall balance assessment: Needs assistance Sitting-balance support: No upper extremity supported;Feet supported Sitting balance-Leahy Scale: Good     Standing balance support: During functional activity;Bilateral upper extremity supported Standing balance-Leahy Scale: Fair                               Pertinent Vitals/Pain Pain Assessment: Faces Pain Score: 0-No pain Faces Pain Scale: No hurt  Home Living Family/patient expects to be discharged to:: Skilled nursing facility                 Additional Comments: pt unable to report history 2/2 expressive aphasia. Per RN pt has estranged dtr in [place] who is unable to provide support at time of discharge. Seems pt has no other caregivers available    Prior Function           Comments: unsure, seemed to be ambulating post stroke last month at CIR, and then to SNF for cotnionued rehab and assistance due to home alone and complete expressive aphasia and safety.     Hand Dominance        Extremity/Trunk Assessment   Upper Extremity  Assessment Upper Extremity Assessment: Defer to OT evaluation    Lower Extremity Assessment Lower Extremity Assessment: RLE deficits/detail RLE Deficits / Details: tone noted in R LE with extension pattern at times, unable to isolate movements. able to move against greavity for hip flexor and knee extension, however unablet o perform isolated DF , but able to AAROM to DF with knee flex and hip flexion to neutral.       Communication   Communication: Expressive difficulties (unclear of expressive deficits, does follow simple commands and gestures.)  Cognition Arousal/Alertness: Awake/alert   Overall Cognitive Status: No family/caregiver present to determine baseline cognitive functioning                                 General Comments: very attentive to me and my commnads during session and very motivated as well      General Comments      Exercises     Assessment/Plan    PT Assessment Patient needs continued PT services  PT Problem List Decreased strength;Decreased range of motion;Decreased activity tolerance;Decreased balance;Decreased mobility       PT Treatment Interventions Gait training;Functional mobility training;Therapeutic activities;Therapeutic exercise;Neuromuscular re-education;Balance training    PT Goals (Current goals can be found in the Care Plan section)  Acute Rehab PT Goals PT Goal Formulation: Patient unable to participate in goal setting Time For Goal Achievement: 12/04/19 Potential to Achieve Goals: Good    Frequency Min 3X/week   Barriers to discharge        Co-evaluation               AM-PAC PT "6 Clicks" Mobility  Outcome Measure Help needed turning from your back to your side while in a flat bed without using bedrails?: None Help needed moving from lying on your back to sitting on the side of a flat bed without using bedrails?: None Help needed moving to and from a bed to a chair (including a wheelchair)?: A  Little Help needed standing up from a chair using your arms (e.g., wheelchair or bedside chair)?: A Lot Help needed to walk in hospital room?: A Lot Help needed climbing 3-5 steps with a railing? : A Lot 6 Click Score: 17    End of Session Equipment Utilized During Treatment: Gait belt Activity Tolerance: Patient tolerated treatment well Patient left: in bed;with call bell/phone within reach Nurse Communication: Mobility status PT Visit Diagnosis: Other abnormalities of gait and mobility (R26.89);Hemiplegia and hemiparesis Hemiplegia - Right/Left: Right    Time: 1330-1400 PT Time Calculation (min) (ACUTE ONLY): 30 min   Charges:   PT Evaluation $PT Eval Moderate Complexity: 1 Mod PT Treatments $Neuromuscular Re-education: 8-22 mins  Gatha Mayer, PT, MPT Acute Rehabilitation Services Office: [phone] Pager: [phone] 11/21/2019   Clide Dales 11/21/2019, 4:19 PM

## 2019-11-21 NOTE — ED Notes (Signed)
MD Bonner Puna paged regarding pt bladder scan results of 588 mL.

## 2019-11-21 NOTE — Progress Notes (Signed)
Contacted [name] regarding transfer to [place] room 1527. [name] gave information pertaining to patient including patient's diet preference-no shellfish or pork, patient's interest in classics on the television, and patient's love of decaf coffee.

## 2019-11-21 NOTE — Progress Notes (Signed)
PROGRESS NOTE  Gerald Rosario  DGU:[id number] DOB: [birth year] DOA: 11/10/2019 PCP: Binnie Rail, MD  Outpatient Specialists: [facility] Brief Narrative: Gerald Rosario is a 74 y.o. male with history of prostate cancer, CABG x3, paroxysmal A. fib, ischemic cardiomyopathy, hypertension, hyperlipidemia's, diabetes, who presents from skilled nursing facility with worsening hypoxia in the setting of known Covid infection.  Patient recently admitted to [facility] from June 23 to July 6 for acute stroke due to occlusion of left ICA and MCA.  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  He was discharged to a SNF ([facility]), he was made DNR prior to discharge.  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  Per ED providers discussion with power of attorney patient was diagnosed with Covid approximately 2 weeks ago at his SNF, started on oxygen several days ago although the exact timing is unclear and was also treated with antibiotics.  He did not receive any Covid vaccination.  Patient began to have escalating oxygen requirements which prompted transfer to the emergency department.   Review of outside records showed that he was given 5 days of p.o. Decadron, as well as amoxicillin and azithromycin while at the nursing home.  Arrival to the ED patient was initially hypoxic to the 80s but with then placed on 6 L of oxygen with improvement in saturation.  Labs notable for positive Covid test, elevated inflammatory markers, normal procalcitonin, CBC with elevated white count of 14 and improving anemia with hemoglobin 11.7, platelets of 496, as well as elevated lactic acid of 3.2  He was given IV Decadron remdesivir in the ED and admitted for further management of his acute hypoxemic respiratory failure secondary to Covid infection.  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 PCT negative. Lymphopenic with elevated CRP 13.5.  - Wean oxygen as tolerated, stable at 10LPM. With severity and significant number of comorbidities, a poor prognosis is expected. Palliative care has been consulted. DNR is confirmed.  - Continue remdesivir x5 days (8/17 - 8/21) - Continue steroids, augmented dosing as CRP remains >10. - Continue baricitinib, started 8/19, for 14 days or until hospital discharge. Monitor Cr, LFTs, differential. Keep on VTE ppx.  - D-dimer monitoring, down today, continue full dose eliquis at this time.  - Encourage OOB, IS, FV, and awake proning if able - Continue airborne, contact precautions for 21 days from positive testing. - Monitor CMP and inflammatory markers   Chronic diastolic CHF (grade III diastolic dysfunction on echo June 2021), ICM, paroxysmal AFib, CAD s/p CABG: BNP 558. - Continue lasix, metoprolol, anticoagulation. CXR personally reviewed revealing diffuse bilateral infiltrates. BNP also noted to be elevated, though not peripherally overloaded. Will trial IV lasix  today, monitor I/O, daily weights, and Cr in AM.  - Continue PT rehabilitation  Hyperkalemia: Resolved.  History of CVA with persistent aphasia: Pt able to understand much of what is said to him. - ASA,  statin - Heart healthy and carb modified diet with nectar thick liquids  Prostate CA: Dr. Alvy Bimler reached out as she was reviewing outpatient referrals. Will need to delay this at this time.  T2DM: At inpatient goal currently. - Continue low dose basal levemir and and continue SSI. - Continue linagliptin.    Hypertension:  - Continue amlodipine  Hyperlipidemia:  - Continue atorvastatin  Asthma: No wheezing.  - Continue symbicort, combivent  BPH:  - Continue finasteride and tamsulosin  Hypothyroidism:  - Continue synthroid  GERD:  - Continue PPI  DVT prophylaxis: Eliquis Code Status: DNR, POA Family Communication: HCPOA by phone today Disposition Plan:  Status is: Inpatient  Remains inpatient appropriate because:Inpatient level of care appropriate due to severity of illness   Dispo: The patient is from: SNF Per power of attorney he is very unhappy with care at current nursing home, consult transitions of care for dispo planning to alternative facility              Anticipated d/c is to: SNF              Anticipated d/c date is: 3 days              Patient currently is not medically stable to d/c.  Consultants:   None  Procedures:   None  Antimicrobials:  Remdesivir 8/17 - 8/21  Subjective: Pt in no distress, does not seem to be short of breath nor does he confirm chest pain. Eating ok. No major changes to oxygen overnight.  Objective: Vitals:   11/21/19 0830 11/21/19 0847 11/21/19 0944 11/21/19 1035  BP: (!) 115/58 121/85 108/63 108/63  Pulse: 72 (!) 53 92 92  Resp: 16 20  20   Temp:  98.3 F (36.8 C)  98.3 F (36.8 C)  TempSrc:  Oral  Oral  SpO2: 99% 91%  91%  Weight:      Height:        Intake/Output Summary (Last 24 hours) at 11/21/2019 1037 Last data filed at 11/20/2019 1802 Gross per 24 hour  Intake --  Output 600 ml  Net -600 ml   Gen: Well-nourished elderly male in no distress Pulm: Nonlabored breathing 10L HFNC, tachypneic. Crackles  bilaterally predominantly at bases. CV: Regular rate and rhythm. No murmur, rub, or gallop. No JVD, no pitting dependent edema. GI: Abdomen soft, non-tender, non-distended, with normoactive bowel sounds.  Ext: Warm, no deformities Skin: No new rashes, lesions or ulcers on visualized skin. Neuro: Alert, interactive, expressive aphasia stable, moves all extremities and follows commands.  Psych: UTD. Behavior is appropriate.    Data Reviewed: I have personally reviewed following labs and imaging studies  CBC: Recent Labs  Lab 11/24/2019 1829 11/19/19 0400 11/20/19 0541 11/21/19 0415  WBC 14.0* 6.5 17.0* 28.0*  NEUTROABS 12.7* 5.9 15.7* 26.1*  HGB 11.7* 10.2* 10.4* 9.2*  HCT 35.6* 32.1* 31.9* 28.3*  MCV 88.1 88.9 88.9 87.9  PLT 496* 409* 375 852   Basic Metabolic Panel: Recent Labs  Lab 11/12/2019 1829 11/19/19 0400 11/20/19 0541 11/21/19 0415  NA 135 138 137 138  K 4.5 5.4* 4.5 4.9  CL 104 107 106 104  CO2 21* 23 23 22   GLUCOSE 139* 326* 228* 144*  BUN 31* 33* 31* 38*  CREATININE 1.09 0.95 0.85 1.03  CALCIUM 9.7 9.4 9.8 9.7  MG  --  2.3 2.1 2.0  PHOS  --  3.7 3.4 3.3   GFR: Estimated Creatinine Clearance: 60 mL/min (by C-G formula based on SCr of 1.03 mg/dL). Liver Function Tests: Recent Labs  Lab 11/17/2019 1829 11/19/19 0400 11/20/19 0541 11/21/19 0415  AST 35 24 22 20   ALT 28 24 23 17   ALKPHOS 69 58 59 56  BILITOT 1.1 0.7 1.2 1.0  PROT 7.8 6.7 6.6 5.8*  ALBUMIN 3.2* 2.7* 2.7* 2.4*   No results for input(s): LIPASE, AMYLASE in the last 168 hours. No results for input(s): AMMONIA in the last 168 hours. Coagulation Profile: No results for input(s): INR, PROTIME in the last 168 hours. Cardiac Enzymes: No results for input(s): CKTOTAL, CKMB, CKMBINDEX, TROPONINI in the last 168 hours. BNP (last 3 results) No results for input(s): PROBNP in the last 8760 hours. HbA1C: No results for input(s): HGBA1C in the last 72 hours. CBG: Recent Labs  Lab 11/19/19 2216  11/20/19 0753 11/20/19 1209 11/20/19 1648 11/21/19 0838  GLUCAP 148* 176* 151* 166* 171*   Lipid Profile: Recent Labs    11/23/2019 1803  TRIG 95   Thyroid Function Tests: No results for input(s): TSH, T4TOTAL, FREET4, T3FREE, THYROIDAB in the last 72 hours. Anemia Panel: Recent Labs    11/20/19 0541 11/21/19 0415  FERRITIN 745* 718*   Urine analysis:    Component Value Date/Time   COLORURINE YELLOW 09/28/2019 1524   APPEARANCEUR CLOUDY (A) 09/28/2019 1524   LABSPEC 1.008 09/28/2019 1524   PHURINE 6.0 09/28/2019 1524   GLUCOSEU >=500 (A) 09/28/2019 1524   HGBUR SMALL (A) 09/28/2019 1524   BILIRUBINUR NEGATIVE 09/28/2019 1524   KETONESUR NEGATIVE 09/28/2019 1524   PROTEINUR NEGATIVE 09/28/2019 1524   UROBILINOGEN 1.0 09/02/2013 1838   NITRITE NEGATIVE 09/28/2019 1524   LEUKOCYTESUR LARGE (A) 09/28/2019 1524   Recent Results (from the past 240 hour(s))  Blood Culture (routine x 2)     Status: None (Preliminary result)   Collection Time: 11/20/2019  4:43 PM   Specimen: BLOOD  Result Value Ref Range Status   Specimen Description   Final    BLOOD RIGHT ANTECUBITAL Performed at [facility], [place] [address], [place], [place] [address]    Special Requests   Final    BOTTLES DRAWN AEROBIC AND ANAEROBIC Blood Culture adequate volume Performed at [place] [address] [place], [place] [address]    Culture   Final    NO GROWTH 3 DAYS Performed at [facility], [place] [address] [place], [place] [address]    Report Status PENDING  Incomplete  SARS Coronavirus 2 by RT PCR (hospital order, performed in [facility] hospital lab) Nasopharyngeal Nasopharyngeal Swab     Status: Abnormal   Collection Time: 11/12/2019  6:26 PM   Specimen: Nasopharyngeal Swab  Result Value Ref Range Status   SARS Coronavirus 2 POSITIVE (A) NEGATIVE Final    Comment: RESULT CALLED TO, READ BACK BY AND VERIFIED WITH: BROOKS,B. RN @1924  11/17/2019  BILLINGSLEY,L (NOTE) SARS-CoV-2 target nucleic acids are DETECTED  SARS-CoV-2 RNA is generally detectable in upper respiratory specimens  during the acute phase of infection.  Positive results are indicative  of the presence of the identified virus, but do not rule out bacterial infection or co-infection with other pathogens not detected by the test.  Clinical correlation with patient history and  other diagnostic information is  necessary to determine patient infection status.  The expected result is negative.  Fact Sheet for Patients:   StrictlyIdeas.no   Fact Sheet for Healthcare Providers:   BankingDealers.co.za    This test is not yet approved or cleared by the [place] FDA and  has been authorized for detection and/or diagnosis of SARS-CoV-2 by FDA under an Emergency Use Authorization (EUA).  This EUA will remain in effect (meaning t his test can be used) for the duration of  the COVID-19 declaration under Section 564(b)(1) of the Act, 21 U.S.C. section 360-bbb-3(b)(1), unless the authorization is terminated or revoked sooner.  Performed at [facility], [place] [address], [place], [place] [address]   Blood Culture (routine x 2)     Status: None (Preliminary result)   Collection Time: 11/13/2019  6:29 PM   Specimen: BLOOD  Result Value Ref Range Status   Specimen Description   Final    BLOOD LEFT ANTECUBITAL Performed at [facility], [place] [address] [place], [place] [address]    Special Requests   Final    BOTTLES DRAWN AEROBIC AND ANAEROBIC Blood Culture adequate volume Performed at [place] [address] [place], [place] [address]    Culture   Final    NO GROWTH 3 DAYS Performed at [facility], [place] [address], [place], [place] [address]    Report Status PENDING  Incomplete      Radiology Studies: DG CHEST PORT 1 VIEW  Result Date: 11/20/2019 CLINICAL DATA:   COVID positive. EXAM: PORTABLE CHEST 1 VIEW COMPARISON:  11/14/2019. FINDINGS: Prior CABG. Cardiomegaly. Bilateral pulmonary infiltrates/edema again noted. Slight improvement in aeration from prior exam. No prominent pleural effusion. No pneumothorax. IMPRESSION: 1.  Prior CABG.  Cardiomegaly. 2. Bilateral pulmonary infiltrates/edema again noted. Slight improvement in aeration from prior exam. Electronically Signed   By: Marcello Moores  Register   On: 11/20/2019 09:34    Scheduled Meds: . amLODipine  10 mg Oral Daily  . apixaban  5 mg Oral BID  . ascorbic acid  500 mg Oral Daily  . atorvastatin  40 mg Oral QHS  . baricitinib  4 mg Oral Daily  . finasteride  5 mg Oral Daily  . fluticasone furoate-vilanterol  1 puff Inhalation Daily  . furosemide  20 mg Intravenous Once  . furosemide  20 mg Oral q AM  . insulin aspart  0-9 Units Subcutaneous TID WC  . insulin detemir  10 Units Subcutaneous Daily  . Ipratropium-Albuterol  1 puff Inhalation Q6H  . levothyroxine  75 mcg Oral QAC breakfast  . linagliptin  5 mg Oral Daily  . methylPREDNISolone (SOLU-MEDROL) injection  60 mg Intravenous Q12H  . metoprolol tartrate  50 mg Oral BID  . multivitamin with minerals  1 tablet Oral Daily  . omega-3 acid ethyl esters  1 g Oral BID  . pantoprazole  40 mg Oral QAC breakfast  . sodium chloride flush  3 mL Intravenous Q12H  . tamsulosin  0.4 mg Oral Daily   Continuous Infusions: . remdesivir 100 mg in NS 100 mL 100 mg (11/21/19 0959)     LOS: 3 days   Time spent: 35 minutes.  Patrecia Pour, MD [facility] [email or web address] 11/21/2019, 10:37 AM

## 2019-11-21 NOTE — Consult Note (Signed)
Consultation Note Date: 11/21/2019   Patient Name: Gerald Rosario  DOB: 1946-03-26  MRN: [id number]  Age / Sex: 74 y.o., male  PCP: Binnie Rail, MD Referring Physician: Patrecia Pour, MD  Reason for Consultation: Establishing goals of care  HPI/Patient Profile: 74 y.o. male   admitted on 11/15/2019   74 year old gentleman who came from a skilled nursing facility.  Patient had a stroke in June 2021 after which he went to [facility] inpatient rehab and then to a skilled nursing facility.  Patient has past medical history significant for prostate cancer coronary artery disease diabetes and hypertension.  Chart review also notes ischemic cardiomyopathy paroxysmal atrial fibrillation and possibly also ICD placement in the past.  Patient has not had a functional as well as cognitive decline since his stroke.  He has not received any Covid vaccination.  Patient has been admitted with acute hypoxic respiratory failure, COVID-19 pneumonia.  A palliative care consultation has been requested for goals of care discussions.  Clinical Assessment and Goals of Care: Chart has been reviewed.  Checked in with bedside emergency department RN.  Appreciate her input.  After appropriate PPE was donned, I entered the patient's room, I met with the patient, patient seen and examined.  He is not able to verbalize however is able to understand everything that I am saying, is able to follow commands, gestures to make his needs known.  I discussed with the patient that he is in the emergency department at [facility] and that he has been diagnosed with COVID-19.  I discussed about his current treatment plan.  I did let him know that I would be placing a call to Mr. Aletha Halim.   Call placed and discussed with healthcare power of attorney Mr. [name] at [phone].  I introduced myself and palliative care as follows:  Palliative medicine  is specialized medical care for people living with serious illness. It focuses on providing relief from the symptoms and stress of a serious illness. The goal is to improve quality of life for both the patient and the family.  Goals of care: Broad aims of medical therapy in relation to the patient's values and preferences. Our aim is to provide medical care aimed at enabling patients to achieve the goals that matter most to them, given the circumstances of their particular medical situation and their constraints.   Brief life review performed.  It has been learned that the patient is a green be[employer] [place] veteran.  Mr. Alpha Gula was also in the [place] and has been the patient's best friend for a number of decades now.  He is the patient's healthcare power of attorney agent.  Patient has a daughter but they are estranged and not in communication. I thanked them both for their service to our country. Patient's healthcare power of attorney states that the patient used to have an ICD but that has been discontinued as best as he knows.  He has noticed cognitive as well as functional decline after the patient's stroke.  Prior to his stroke the patient was extremely physically active he is to go to the gym several times a week and also used to walk several miles on a daily basis.  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  He hopes that the patient will not require hospice care anytime soon but is accepting of palliative support upon discharge.  He states that he is in talks with [facility] facility for possible disposition option towards the end of this hospitalization.  We discussed about patient's underlying conditions, his recent stroke as well as current treatment protocol for COVID-19 infection that is being employed right now.  Endorsed his wishes for the patient recovering, also gently discussed that the patient has a high likelihood of decline and decompensation.  Recommend  palliative services.  Continue current mode of care.  See below.  Thank you for the consult.  HCPOA Patient's best friend of many decades, Mr. Gentry Roch at [phone].   SUMMARY OF RECOMMENDATIONS   Agree with DO NOT RESUSCITATE Continue current mode of care Recommend palliative services at next facility upon discharge, likely [place] facility in [place] is being considered. No acute symptom management needs at this time, continue to monitor Thank you for the consult.   Code Status/Advance Care Planning:  DNR    Symptom Management:    as above.   Palliative Prophylaxis:  Delirium Protocol   Psycho-social/Spiritual:   Desire for further Chaplaincy support:yes  Additional Recommendations: Caregiving  Support/Resources  Prognosis:   < 12 months  Discharge Planning: [place], [place]? recommend palliative follow up over there.       Primary Diagnoses: Present on Admission: . Essential hypertension . Extrinsic asthma . Coronary artery disease . Ischemic cardiomyopathy . Hyperlipidemia . Chronic systolic heart failure ([place]) . 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 PAF (paroxysmal atrial fibrillation) ([place]) . Hypothyroidism . 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 Prostate cancer ([place]) . Acute hypoxemic respiratory failure due to COVID-19 [facility])   I have reviewed the medical record, interviewed the patient and family, and examined the patient. The following aspects are pertinent.  Past Medical History:  Diagnosis Date  . Asthma    start dulera 100 April 12,2011 > better but "knot in throat" so try qvar June 7,2011 > preferred dulera. HFA 90% May 10,2011 > 90% October 17,2011. PFT's June 7,2011 wnl x minimal nonspecific mid flow reduction while on dulera. Changed to advair intermediate strength October 17,2011 due to ins issue  . CAD (coronary artery disease)    a. severe multivessel CAD  s/p STEMI (2012) with severe ICM (EF 20-25%) now improved to 55-60%    b. 08/2013 s/p CABG 3 with LIMA-LAD, SVG-RI, SVG-D1  c. 01/2014 NSTEMI  s/p DES to SVG-RI  . Chronic kidney disease   . Chronic systolic heart failure ([place])   . Diabetes mellitus   . Dyspnea   . Enlarged prostate   . Enlarged prostate   . H/O hyperkalemia   . Hearing loss   . HLD (hyperlipidemia)   . Hoarseness 12-20-11   onset 11/09. neg w/u 09/2008. saw Dr. Raelene Bott. 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 Hx of detached retina repair    a. @ [facility]; Right Eye  . Hyperlipidemia   . Hypertension   . HYPERTENSION   . Ischemic cardiomyopathy    Repeat Cardiac MRI - EF  52%, distal Septal & apical Akinesis (suggest scar), unable to assess viability due to patient movement  . Multiple fractures of ribs of left side   . PAF (paroxysmal atrial fibrillation) ([place])    a. post CABG  . Prostate cancer ([place]) 09/24/2019  . Pulmonary nodule   . PULMONARY NODULE, RIGHT MIDDLE LOBE   . 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 and clipping of LA appendage    a. LIMA-LAD, SVG-RI, SVG-D1  . Syncope   . Syncope and collapse   . Vocal cord dysfunction    Social History   Socioeconomic History  . Marital status: Single    Spouse name: Not on file  . Number of children: 1  . Years of education: Not on file  . Highest education level: Not on file  Occupational History  . Occupation: Unemployed  Tobacco Use  . Smoking status: Former Smoker    Packs/day: 2.00    Years: 18.00    Pack years: 36.00    Types: Cigarettes    Quit date: 10/31/1980    Years since quitting: 39.0  . Smokeless tobacco: Never Used  Vaping Use  . Vaping Use: Never used  Substance and Sexual Activity  . Alcohol use: Yes    Alcohol/week: 0.0 standard drinks    Comment: occasional  . Drug use: No  . Sexual activity: Not Currently  Other Topics Concern  . Not on file  Social History Narrative  . Not on file   Social  Determinants of Health   Financial Resource Strain:   . Difficulty of Paying Living Expenses: Not on file  Food Insecurity:   . Worried About Charity fundraiser in the Last Year: Not on file  . Ran Out of Food in the Last Year: Not on file  Transportation Needs:   . Lack of Transportation (Medical): Not on file  . Lack of Transportation (Non-Medical): Not on file  Physical Activity:   . Days of Exercise per Week: Not on file  . Minutes of Exercise per Session: Not on file  Stress:   . Feeling of Stress : Not on file  Social Connections:   . Frequency of Communication with Friends and Family: Not on file  . Frequency of Social Gatherings with Friends and Family: Not on file  . Attends Religious Services: Not on file  . Active Member of Clubs or Organizations: Not on file  . Attends Archivist Meetings: Not on file  . Marital Status: Not on file   Family History  Problem Relation Age of Onset  . Cancer Mother        Colon  . Heart attack Father        died MI 84   Scheduled Meds: . amLODipine  10 mg Oral Daily  . apixaban  5 mg Oral BID  . ascorbic acid  500 mg Oral Daily  . atorvastatin  40 mg Oral QHS  . baricitinib  4 mg Oral Daily  . finasteride  5 mg Oral Daily  . fluticasone furoate-vilanterol  1 puff Inhalation Daily  . furosemide  20 mg Intravenous Once  . furosemide  20 mg Oral q AM  . insulin aspart  0-9 Units Subcutaneous TID WC  . insulin detemir  10 Units Subcutaneous Daily  . Ipratropium-Albuterol  1 puff Inhalation Q6H  . levothyroxine  75 mcg Oral QAC breakfast  . linagliptin  5 mg Oral Daily  . methylPREDNISolone (SOLU-MEDROL) injection  60  mg Intravenous Q12H  . metoprolol tartrate  50 mg Oral BID  . multivitamin with minerals  1 tablet Oral Daily  . omega-3 acid ethyl esters  1 g Oral BID  . pantoprazole  40 mg Oral QAC breakfast  . sodium chloride flush  3 mL Intravenous Q12H  . tamsulosin  0.4 mg Oral Daily   Continuous Infusions: .  remdesivir 100 mg in NS 100 mL 100 mg (11/21/19 0959)   PRN Meds:.acetaminophen, Ipratropium-Albuterol, ondansetron **OR** ondansetron (ZOFRAN) IV, oxyCODONE, polyethylene glycol, traZODone Medications Prior to Admission:  Prior to Admission medications   Medication Sig Start Date End Date Taking? Authorizing Provider  Accu-Chek FastClix Lancets MISC USE TO CHECK BLOOD SUGARS THREE TIMES DAILY 07/08/18  Yes Burns, Claudina Lick, MD  acetaminophen (TYLENOL) 500 MG tablet Take 1,000 mg by mouth every 8 (eight) hours as needed for moderate pain.    Yes [provider]  amLODipine (NORVASC) 10 MG tablet Take 1 tablet (10 mg total) by mouth daily. 10/08/19  Yes Donzetta Starch, NP  apixaban (ELIQUIS) 5 MG TABS tablet Take 1 tablet (5 mg total) by mouth 2 (two) times daily. 10/07/19  Yes Donzetta Starch, NP  ascorbic acid (VITAMIN C) 500 MG tablet Take 500 mg by mouth daily.   Yes [provider]  atorvastatin (LIPITOR) 40 MG tablet Take 1 tablet (40 mg total) by mouth at bedtime. 10/07/19  Yes Donzetta Starch, NP  budesonide-formoterol (SYMBICORT) 80-4.5 MCG/ACT inhaler Take 2 puffs first thing in am and then another 2 puffs about 12 hours later. Patient taking differently: Inhale 2 puffs into the lungs 2 (two) times daily.  05/24/15  Yes Tanda Rockers, MD  Carboxymethylcellulose Sod PF 0.5 % SOLN Place 1 drop into both eyes 4 (four) times daily.   Yes [provider]  co-enzyme Q-10 50 MG capsule Take 200 mg by mouth daily.    Yes [provider]  finasteride (PROSCAR) 5 MG tablet Take 5 mg by mouth daily.    Yes [provider]  fish oil-omega-3 fatty acids 1000 MG capsule Take 1 g by mouth 2 (two) times daily.    Yes [provider]  furosemide (LASIX) 20 MG tablet Take 20 mg by mouth in the morning.   Yes [provider]  glipiZIDE (GLUCOTROL) 10 MG tablet Take 10 mg by mouth.   Yes [provider]  Glucosamine-Chondroitin (OSTEO BI-FLEX  REGULAR STRENGTH PO) Take 1 tablet by mouth 2 (two) times daily.    Yes [provider]  guaiFENesin (MUCINEX) 600 MG 12 hr tablet Take 600 mg by mouth every 12 (twelve) hours.    Yes [provider]  ibuprofen (ADVIL) 200 MG tablet Take 200 mg by mouth every 8 (eight) hours as needed for fever.    Yes [provider]  Ipratropium-Albuterol (COMBIVENT RESPIMAT) 20-100 MCG/ACT AERS respimat Inhale 1 puff into the lungs 4 (four) times daily as needed for wheezing (or coughing).    Yes [provider]  levothyroxine (SYNTHROID) 75 MCG tablet Take 75 mcg by mouth daily before breakfast.   Yes [provider]  Maltodextrin-Xanthan Gum (RESOURCE THICKENUP CLEAR) POWD Take 120 g by mouth as needed (for nectar thick liquids). 10/07/19  Yes Donzetta Starch, NP  melatonin 3 MG TABS tablet Take 3 mg by mouth at bedtime.    Yes [provider]  metFORMIN (GLUCOPHAGE) 500 MG tablet Take 500 mg by mouth daily.    Yes  [provider]  metoprolol tartrate (LOPRESSOR) 50 MG tablet Take 50 mg by mouth 2 (two) times daily.   Yes [provider]  Multiple Vitamin (MULTIVITAMIN) tablet Take 1 tablet by mouth daily.     Yes [provider]  NOVOLOG FLEXPEN 100 UNIT/ML FlexPen Inject 0-10 Units into the skin See admin instructions. Inject as per sliding scale.  0-50=0 Units 151-200=2 units 201-250=4 units 251-300=6 units 301-350=8 units 351-400=10 units 11/15/19  Yes [provider]  pantoprazole (PROTONIX) 40 MG tablet Take 40 mg by mouth daily before breakfast.    Yes [provider]  prednisoLONE acetate (PRED FORTE) 1 % ophthalmic suspension 3 drops See admin instructions. Instill 3 drops into each nostril at bedtime "in a head-hanging position" 01/09/19  Yes [provider]  tamsulosin (FLOMAX) 0.4 MG CAPS capsule Take 1 capsule (0.4 mg total) by mouth daily. Patient taking differently: Take 0.4 mg by mouth  every evening.  09/08/13  Yes Barrett, Erin R, PA-C  traMADol (ULTRAM) 50 MG tablet Take 50 mg by mouth 4 (four) times daily as needed for pain. 11/12/19  Yes [provider]  ferrous sulfate 325 (65 FE) MG EC tablet Take 325 mg by mouth See admin instructions. Take 325 mg by mouth on Mon/Wed/Fri and WITH the prescribed vitamin C (on these 3 days)    [provider]  glucose blood (ACCU-CHEK AVIVA PLUS) test strip 1 each by Other route 2 (two) times daily. Dx code: E11.9 01/03/18   Binnie Rail, MD   Allergies  Allergen Reactions  . Sulfa Antibiotics Swelling and Other (See Comments)    Possible angioedema, per the patient's daughter (ICU RN)   Review of Systems Denies pain.   Physical Exam Aphasic but is able to gesture appropriately, is able to respond appropriately,  No distress Coarse breath sounds s1 S2 Has mid line scar on chest No edema Skin is warm and dry Gestures with his hands, follows commands 10 L HF [place].  Vital Signs: BP (!) 102/48 (BP Location: Right Arm)   Pulse 63   Temp 98.3 F (36.8 C) (Oral)   Resp 17   Ht 5' 8"  (1.727 m)   Wt 68.9 kg   SpO2 100%   BMI 23.10 kg/m  Pain Scale: 0-10   Pain Score: Asleep   SpO2: SpO2: 100 % O2 Device:SpO2: 100 % O2 Flow Rate: .O2 Flow Rate (L/min): 10 L/min  IO: Intake/output summary:   Intake/Output Summary (Last 24 hours) at 11/21/2019 1135 Last data filed at 11/21/2019 1116 Gross per 24 hour  Intake --  Output 1400 ml  Net -1400 ml    LBM:   Baseline Weight: Weight: 68.9 kg Most recent weight: Weight: 68.9 kg     Palliative Assessment/Data:   PPS 40%  Time In:  9       Time Out:  10.10 Time Total:  70 min.  Greater than 50%  of this time was spent counseling and coordinating care related to the above assessment and plan.  Signed by: Loistine Chance, MD   Please contact Palliative Medicine Team phone at [phone] for questions and concerns.  For individual provider: See  Shea Evans

## 2019-11-22 ENCOUNTER — Inpatient Hospital Stay (HOSPITAL_COMMUNITY): Payer: No Typology Code available for payment source

## 2019-11-22 DIAGNOSIS — R0602 Shortness of breath: Secondary | ICD-10-CM

## 2019-11-22 LAB — GLUCOSE, CAPILLARY
Glucose-Capillary: 185 mg/dL — ABNORMAL HIGH (ref 70–99)
Glucose-Capillary: 262 mg/dL — ABNORMAL HIGH (ref 70–99)
Glucose-Capillary: 241 mg/dL — ABNORMAL HIGH (ref 70–99)

## 2019-11-22 LAB — CBC WITH DIFFERENTIAL/PLATELET
Abs Immature Granulocytes: 0 10*3/uL (ref 0.00–0.07)
Band Neutrophils: 4 %
Basophils Absolute: 0 10*3/uL (ref 0.0–0.1)
Basophils Relative: 0 %
Blasts: 0 %
Eosinophils Absolute: 0 10*3/uL (ref 0.0–0.5)
Eosinophils Relative: 0 %
HCT: 28.6 % — ABNORMAL LOW (ref 39.0–52.0)
Hemoglobin: 9.2 g/dL — ABNORMAL LOW (ref 13.0–17.0)
Lymphocytes Relative: 5 %
Lymphs Abs: 1.2 10*3/uL (ref 0.7–4.0)
MCH: 28.9 pg (ref 26.0–34.0)
MCHC: 32.2 g/dL (ref 30.0–36.0)
MCV: 89.9 fL (ref 80.0–100.0)
Metamyelocytes Relative: 0 %
Monocytes Absolute: 1 10*3/uL (ref 0.1–1.0)
Monocytes Relative: 4 %
Myelocytes: 0 %
Neutro Abs: 22.7 10*3/uL — ABNORMAL HIGH (ref 1.7–7.7)
Neutrophils Relative %: 87 %
Other: 0 %
Platelets: 333 10*3/uL (ref 150–400)
Promyelocytes Relative: 0 %
RBC: 3.18 MIL/uL — ABNORMAL LOW (ref 4.22–5.81)
RDW: 14 % (ref 11.5–15.5)
WBC: 24.9 10*3/uL — ABNORMAL HIGH (ref 4.0–10.5)
nRBC: 0 % (ref 0.0–0.2)
nRBC: 0 /100 WBC

## 2019-11-22 LAB — COMPREHENSIVE METABOLIC PANEL
ALT: 15 U/L (ref 0–44)
AST: 18 U/L (ref 15–41)
Albumin: 2.3 g/dL — ABNORMAL LOW (ref 3.5–5.0)
Alkaline Phosphatase: 59 U/L (ref 38–126)
Anion gap: 9 (ref 5–15)
BUN: 52 mg/dL — ABNORMAL HIGH (ref 8–23)
CO2: 23 mmol/L (ref 22–32)
Calcium: 8.9 mg/dL (ref 8.9–10.3)
Chloride: 101 mmol/L (ref 98–111)
Creatinine, Ser: 1.19 mg/dL (ref 0.61–1.24)
GFR calc Af Amer: >60 mL/min (ref >60)
GFR calc non Af Amer: 59 mL/min — ABNORMAL LOW (ref >60)
Glucose, Bld: 183 mg/dL — ABNORMAL HIGH (ref 70–99)
Potassium: 4.8 mmol/L (ref 3.5–5.1)
Sodium: 133 mmol/L — ABNORMAL LOW (ref 135–145)
Total Bilirubin: 0.9 mg/dL (ref 0.3–1.2)
Total Protein: 5.6 g/dL — ABNORMAL LOW (ref 6.5–8.1)

## 2019-11-22 LAB — TROPONIN I (HIGH SENSITIVITY)
Troponin I (High Sensitivity): 12 ng/L (ref <18)
Troponin I (High Sensitivity): 13 ng/L (ref <18)

## 2019-11-22 LAB — MAGNESIUM: Magnesium: 2.2 mg/dL (ref 1.7–2.4)

## 2019-11-22 LAB — D-DIMER, QUANTITATIVE: D-Dimer, Quant: 2.71 ug/mL-FEU — ABNORMAL HIGH (ref 0.00–0.50)

## 2019-11-22 LAB — MRSA PCR SCREENING: MRSA by PCR: POSITIVE — AB

## 2019-11-22 LAB — C-REACTIVE PROTEIN: CRP: 11.9 mg/dL — ABNORMAL HIGH (ref <1.0)

## 2019-11-22 MED ORDER — AMIODARONE LOAD VIA INFUSION
150.0000 mg | Freq: Once | INTRAVENOUS | Status: AC
  Administered 2019-11-22: 150 mg via INTRAVENOUS

## 2019-11-22 MED ORDER — AMIODARONE LOAD VIA INFUSION
150.0000 mg | Freq: Once | INTRAVENOUS | Status: DC
  Filled 2019-11-22: qty 83.34

## 2019-11-22 MED ORDER — AMIODARONE HCL IN DEXTROSE 360-4.14 MG/200ML-% IV SOLN
60.0000 mg/h | INTRAVENOUS | Status: DC
  Filled 2019-11-22: qty 200

## 2019-11-22 MED ORDER — BARICITINIB 2 MG PO TABS
2.0000 mg | ORAL_TABLET | Freq: Every day | ORAL | Status: DC
  Administered 2019-11-22 – 2019-11-23 (×2): 2 mg via ORAL
  Filled 2019-11-22 (×2): qty 1

## 2019-11-22 MED ORDER — SODIUM CHLORIDE 0.9 % IV BOLUS
500.0000 mL | Freq: Once | INTRAVENOUS | Status: AC
  Administered 2019-11-22: 500 mL via INTRAVENOUS

## 2019-11-22 MED ORDER — MORPHINE SULFATE (PF) 2 MG/ML IV SOLN
1.0000 mg | INTRAVENOUS | Status: DC | PRN
  Administered 2019-11-22 – 2019-11-23 (×5): 1 mg via INTRAVENOUS
  Filled 2019-11-22 (×4): qty 1

## 2019-11-22 MED ORDER — MUPIROCIN 2 % EX OINT
1.0000 "application " | TOPICAL_OINTMENT | Freq: Two times a day (BID) | CUTANEOUS | Status: DC
  Administered 2019-11-22 – 2019-11-24 (×5): 1 via NASAL
  Filled 2019-11-22: qty 22

## 2019-11-22 MED ORDER — AMIODARONE HCL IN DEXTROSE 360-4.14 MG/200ML-% IV SOLN
30.0000 mg/h | INTRAVENOUS | Status: DC
  Filled 2019-11-22: qty 200

## 2019-11-22 MED ORDER — AMIODARONE HCL IN DEXTROSE 360-4.14 MG/200ML-% IV SOLN
60.0000 mg/h | INTRAVENOUS | Status: DC
  Administered 2019-11-22 (×2): 60 mg/h via INTRAVENOUS
  Filled 2019-11-22: qty 200

## 2019-11-22 MED ORDER — AMIODARONE HCL IN DEXTROSE 360-4.14 MG/200ML-% IV SOLN: 30.0000 mg/h | INTRAVENOUS | Status: DC

## 2019-11-22 NOTE — Progress Notes (Signed)
I was called as Rapid response to assess patient.  Was reported to be in RVR.  Upon assessment patent was in Afib rate in the 100-120's.  Patient pressure in the 90's over 60's.  With rate controled and pressure low will hold Amio and start if rate increases,  Will transfer to progressive unit as soon as bed available. (unit currently working to discharge patient).  Patient is covid positive and on covid unit.  Current nurse is flex RN with progressive training so will continue to monitor and transfer when bed available.

## 2019-11-22 NOTE — Progress Notes (Signed)
Patient MEWS score red @ 11:00 am  Rapid response nurse  and MD at bedside at this. MEWS protocol initiated @ 1230 when RN made aware . Patient currently awaiting transfer to  progressive unit.

## 2019-11-22 NOTE — Progress Notes (Signed)
Patient's BP and HR trending down post administration of metoprolol PO as scheduled. Patient is on an amiodarone drip, and is only able to reply "50-50" assessment indicates same orientation as previous assessment. MD paged for parameter orders for hold/stop drip.

## 2019-11-22 NOTE — Progress Notes (Signed)
PROGRESS NOTE  Gerald Rosario  MPN:[id number] DOB: 1945-05-25 DOA: 11/06/2019 PCP: Binnie Rail, MD  Outpatient Specialists: [facility] [place] Brief Narrative: Gerald Rosario is a 74 y.o. male with history of prostate cancer, CABG x3, paroxysmal A. fib, ischemic cardiomyopathy, hypertension, hyperlipidemia's, diabetes, who presents from skilled nursing facility with worsening hypoxia in the setting of known Covid infection.  Patient recently admitted to [facility] from June 23 to July 6 for acute stroke due to occlusion of left ICA and MCA.  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  He was discharged to a SNF ([facility]), he was made DNR prior to discharge.  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  Per ED providers discussion with power of attorney patient was diagnosed with Covid approximately 2 weeks ago at his SNF, started on oxygen several days ago although the exact timing is unclear and was also treated with antibiotics.  He did not receive any Covid vaccination.  Patient began to have escalating oxygen requirements which prompted transfer to the emergency department.   Review of outside records showed that he was given 5 days of p.o. Decadron, as well as amoxicillin and azithromycin while at the nursing home.  Arrival to the ED patient was initially hypoxic to the 80s but with then placed on 6 L of oxygen with improvement in saturation.  Labs notable for positive Covid test, elevated inflammatory markers, normal procalcitonin, CBC with elevated white count of 14 and improving anemia with hemoglobin 11.7, platelets of 496, as well as elevated lactic acid of 3.2  He was given IV Decadron remdesivir in the ED and admitted for further management of his acute hypoxemic respiratory failure secondary to Covid infection.  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 PCT negative. Lymphopenic with elevated CRP 13.5.  - Continue supplemental oxygen. While patient has hypotension, rapid rate, will aim to keep SpO2 >90% for now. With severity and significant number of comorbidities, a poor prognosis is expected. Palliative care is following. DNR is confirmed.  - Complete remdesivir today (8/17 - 8/21) - Continue steroids, augmented dosing to methylprednisolone 60mg  q12h, ~2mg /kg/d. CRP remains markedly elevated, lung exam worsening. Check CXR. ADDENDUM: Reivew CXR personally which shows essentially stable patchy bilateral infiltrates with R greater than L. No lobar consolidation, PTX, or effusions. - Continue baricitinib, started 8/19, for 14 days or until Rosario discharge. Will decrease to 2mg  dose with Cr rise. - D-dimer monitoring, continue full dose eliquis at this time.  - 21 day isolation recommended, unclear when initial positive test, requested that records be obtained from SNF.   Chronic diastolic CHF (grade III diastolic dysfunction on echo June 2021), ICM, paroxysmal AFib, CAD s/p CABG: BNP  558. Though previous ProBNPs were in 5000's. - Will hold lasix today, if BP remains low, will need to give 500cc NS back after IV lasix yesterday. BP too low for metoprolol and rate is increased,  so will order amiodarone, transfer to PCU. - On eliquis.  Hyperkalemia: Resolved.  History of CVA with persistent aphasia: Pt able to understand much of what is said to him. - ASA, statin - HDowngrade to dysphagia diet with aspiration precautions. SLP evaluation requested. ?if rhonchorous breath sounds indicate aspiration event. No definite changes on CXR this AM to indicate that. Will repeat CXR 8/22.   Prostate CA: Dr. Alvy Bimler reached out as she was reviewing outpatient referrals. Will need to delay this at this time.  T2DM: At inpatient goal currently. - Continue low dose basal levemir and and continue SSI. - Continue linagliptin.    Hypertension:  - Hold amlodipine  Hyperlipidemia:  - Continue atorvastatin  Asthma: No wheezing.  - Continue symbicort, combivent  BPH:  - Continue finasteride and tamsulosin  Hypothyroidism:  - Continue synthroid  GERD:  - Continue PPI  DVT prophylaxis: Eliquis Code Status: DNR, POA Family Communication: HCPOA by phone daily Disposition Plan:  Status is: Inpatient  Remains inpatient appropriate because:Inpatient level of care appropriate due to severity of illness   Dispo: The patient is from: SNF Per power of attorney he is very unhappy with care at current nursing home, consult transitions of care for dispo planning to alternative facility              Anticipated d/c is to: SNF              Anticipated d/c date is: > 3 days pending clinical trajectory. Continues to have guarded prognosis.              Patient currently is not medically stable to d/c.  Consultants:   None  Procedures:   None  Antimicrobials:  Remdesivir 8/17 - 8/21  Subjective: HR climbing and BP dropping this morning. RN reports episode where pt seemed to be grabbing at his chest, but is unable to communicate much. He is no longer in any distress. Eating some. Appears calm. HR improving though BP still low. Had to increase oxygen. No aspiration noted.    Objective: Vitals:   11/22/19 1315 11/22/19 1330 11/22/19 1345 11/22/19 1400  BP: (!) 97/48 (!) 98/46 (!) 94/49 93/63  Pulse: (!) 101 98 94 89  Resp: (!) 22 (!) 24 (!) 21 (!) 25  Temp: 98.1 F (36.7 C)  97.6 F (36.4 C) 97.6 F (36.4 C)  TempSrc: Oral  Oral Oral  SpO2: (!) 88% 91% 92% 93%  Weight:      Height:        Intake/Output Summary (Last 24 hours) at 11/22/2019 1421 Last data filed at 11/22/2019 1200 Gross per 24 hour  Intake 319.38 ml  Output 1705 ml  Net -1385.62 ml   Gen: 74 y.o. male in no distress Pulm: Nonlabored breathing 11LPM HFNC. Rhonchi bilaterally. CV: Rapid irreg, rate in 100-110's, no murmur, rub, or gallop. No JVD, no dependent edema. GI: Abdomen soft, non-tender, non-distended, with normoactive bowel sounds.  Ext: Warm, no deformities Skin: No new rashes, lesions or ulcers on visualized skin. Neuro: Alert and interactive, unchanged aphasia. Moves all extremities, follows commands. Psych: Calm, UTD.  Data Reviewed: I have personally reviewed following labs and imaging studies  CBC: Recent Labs  Lab 11/11/2019 1829 11/19/19 0400 11/20/19 0541 11/21/19 0415 11/22/19 0356  WBC 14.0* 6.5 17.0* 28.0* 24.9*  NEUTROABS 12.7* 5.9 15.7* 26.1* 22.7*  HGB 11.7* 10.2* 10.4* 9.2* 9.2*  HCT 35.6* 32.1* 31.9* 28.3* 28.6*  MCV 88.1 88.9 88.9 87.9 89.9  PLT 496* 409* 375 383 619   Basic Metabolic Panel: Recent Labs  Lab 11/17/2019 1829 11/19/19 0400 11/20/19 0541 11/21/19 0415 11/22/19 0356  NA 135 138 137 138 133*  K 4.5 5.4* 4.5 4.9 4.8  CL 104 107 106 104 101  CO2 21* 23 23 22 23   GLUCOSE 139* 326* 228* 144* 183*  BUN 31* 33* 31* 38* 52*  CREATININE 1.09 0.95 0.85 1.03 1.19  CALCIUM 9.7 9.4 9.8 9.7 8.9  MG  --  2.3 2.1 2.0 2.2  PHOS  --  3.7 3.4 3.3  --    GFR: Estimated Creatinine Clearance: 44.5 mL/min (by C-G formula based on SCr of 1.19 mg/dL). Liver Function Tests: Recent Labs  Lab 11/21/2019 1829 11/19/19 0400 11/20/19 0541  11/21/19 0415 11/22/19 0356  AST 35 24 22 20 18   ALT 28 24 23 17 15   ALKPHOS 69 58 59 56 59  BILITOT 1.1 0.7 1.2 1.0 0.9  PROT 7.8 6.7 6.6 5.8* 5.6*  ALBUMIN 3.2* 2.7* 2.7* 2.4* 2.3*   No results for input(s): LIPASE, AMYLASE in the last 168 hours. No results for input(s): AMMONIA in the last 168 hours. Coagulation Profile: No results for input(s): INR, PROTIME in the last 168 hours. Cardiac Enzymes: No results for input(s): CKTOTAL, CKMB, CKMBINDEX, TROPONINI in the last 168 hours. BNP (last 3 results) No results for input(s): PROBNP in the last 8760 hours. HbA1C: No results for input(s): HGBA1C in the last 72 hours. CBG: Recent Labs  Lab 11/21/19 1209 11/21/19 1713 11/21/19 2046 11/22/19 0754 11/22/19 1139  GLUCAP 252* 103* 151* 185* 262*   Lipid Profile: No results for input(s): CHOL, HDL, LDLCALC, TRIG, CHOLHDL, LDLDIRECT in the last 72 hours. Thyroid Function Tests: No results for input(s): TSH, T4TOTAL, FREET4, T3FREE, THYROIDAB in the last 72 hours. Anemia Panel: Recent Labs    11/20/19 0541 11/21/19 0415  FERRITIN 745* 718*   Urine analysis:    Component Value Date/Time   COLORURINE YELLOW 09/28/2019 1524   APPEARANCEUR CLOUDY (A) 09/28/2019 1524   LABSPEC 1.008 09/28/2019 1524   PHURINE 6.0 09/28/2019 1524   GLUCOSEU >=500 (A) 09/28/2019 1524   HGBUR SMALL (A) 09/28/2019 1524   BILIRUBINUR NEGATIVE 09/28/2019 1524   KETONESUR NEGATIVE 09/28/2019 1524   PROTEINUR NEGATIVE 09/28/2019 1524   UROBILINOGEN 1.0 09/02/2013 1838   NITRITE NEGATIVE 09/28/2019 1524   LEUKOCYTESUR LARGE (A) 09/28/2019 1524   Recent Results (from the past 240 hour(s))  Blood Culture (routine x 2)     Status: None (Preliminary result)   Collection Time: 11/20/2019  4:43 PM   Specimen: BLOOD  Result Value Ref Range Status   Specimen Description   Final    BLOOD RIGHT ANTECUBITAL Performed at [facility], [place] [address] [place], [place] [address]    Special Requests    Final    BOTTLES DRAWN AEROBIC AND ANAEROBIC Blood Culture adequate volume Performed at [facility] [address], [place], [place] [address]    Culture   Final    NO GROWTH 3 DAYS Performed at [facility], [place] [address], [place], [place] [address]    Report Status PENDING  Incomplete  SARS Coronavirus 2 by RT PCR ([facility] order, performed in [facility]) Nasopharyngeal  Nasopharyngeal Swab     Status: Abnormal   Collection Time: 11/03/2019  6:26 PM   Specimen: Nasopharyngeal Swab  Result Value Ref Range Status   SARS Coronavirus 2 POSITIVE (A) NEGATIVE Final    Comment: RESULT CALLED TO, READ BACK BY AND VERIFIED WITH: BROOKS,B. RN @1924  11/10/2019 BILLINGSLEY,L (NOTE) SARS-CoV-2 target nucleic acids are DETECTED  SARS-CoV-2 RNA is generally detectable in upper respiratory specimens  during the acute phase of infection.  Positive results are indicative  of the presence of the identified virus, but do not rule out bacterial infection or co-infection with other pathogens not detected by the test.  Clinical correlation with patient history and  other diagnostic information is necessary to determine patient infection status.  The expected result is negative.  Fact Sheet for Patients:   StrictlyIdeas.no   Fact Sheet for Healthcare Providers:   BankingDealers.co.za    This test is not yet approved or cleared by the [place] FDA and  has been authorized for detection and/or diagnosis of SARS-CoV-2 by FDA under an Emergency Use Authorization (EUA).  This EUA will remain in effect (meaning t his test can be used) for the duration of  the COVID-19 declaration under Section 564(b)(1) of the Act, 21 U.S.C. section 360-bbb-3(b)(1), unless the authorization is terminated or revoked sooner.  Performed at [facility], [place] [address], [place], [place] [address]   Blood Culture  (routine x 2)     Status: None (Preliminary result)   Collection Time: 11/27/2019  6:29 PM   Specimen: BLOOD  Result Value Ref Range Status   Specimen Description   Final    BLOOD LEFT ANTECUBITAL Performed at [facility], [place] [address], [place], [place] [address]    Special Requests   Final    BOTTLES DRAWN AEROBIC AND ANAEROBIC Blood Culture adequate volume Performed at [facility] [address], [place], [place] [address]    Culture   Final    NO GROWTH 3 DAYS Performed at [facility], [place] [address] [place], [place] [address]    Report Status PENDING  Incomplete      Radiology Studies: DG CHEST PORT 1 VIEW  Result Date: 11/22/2019 CLINICAL DATA:  Respiratory disease due to COVID-19. EXAM: PORTABLE CHEST 1 VIEW COMPARISON:  November 20, 2019 FINDINGS: Right greater than left pulmonary infiltrates remain, similar in the interval. Stable cardiomediastinal silhouette. No pneumothorax. No other change. IMPRESSION: Right greater than left pulmonary infiltrates remain, similar in the interval. Electronically Signed   By: Dorise Bullion III M.D   On: 11/22/2019 10:23    Scheduled Meds: . amiodarone  150 mg Intravenous Once  . apixaban  5 mg Oral BID  . ascorbic acid  500 mg Oral Daily  . atorvastatin  40 mg Oral QHS  . baricitinib  2 mg Oral Daily  . Chlorhexidine Gluconate Cloth  6 each Topical Daily  . finasteride  5 mg Oral Daily  . fluticasone furoate-vilanterol  1 puff Inhalation Daily  . insulin aspart  0-9 Units Subcutaneous TID WC  . insulin detemir  10 Units Subcutaneous Daily  . Ipratropium-Albuterol  1 puff Inhalation Q6H  . levothyroxine  75 mcg Oral QAC breakfast  . linagliptin  5 mg Oral Daily  . methylPREDNISolone (SOLU-MEDROL) injection  60 mg Intravenous Q12H  . metoprolol tartrate  50 mg Oral BID  . multivitamin with minerals  1 tablet Oral Daily  . omega-3 acid ethyl esters  1 g Oral BID  . pantoprazole  40 mg Oral QAC  breakfast  . sodium chloride flush  3 mL Intravenous Q12H  . tamsulosin  0.4 mg Oral Daily   Continuous Infusions: . amiodarone     Followed by  . amiodarone       LOS: 4 days   Time spent: 35 minutes.  Patrecia Pour, MD [facility] [email or web address] 11/22/2019, 2:21 PM

## 2019-11-22 NOTE — Progress Notes (Signed)
Daily Progress Note   Patient Name: Gerald Rosario       Date: 11/22/2019 DOB: 01/24/1946  Age: 74 y.o. MRN#: [id number] Attending Physician: Gerald Pour, MD Primary Care Physician: Gerald Rail, MD Admit Date: 11/24/2019  Reason for Consultation/Follow-up: Establishing goals of care  Subjective: Patient is awake alert sitting up in bed.  He is trying to feed himself breakfast.  He is only able to state 50-50.  He gestures appropriately, follows commands, tracks me in the room.  Actually, the patient was in the middle of a rapid response, nursing staff was present in the room.  It is noted that the patient is currently in atrial fibrillation with rapid ventricular response as well as having low blood pressures.  Discussed with rapid response RN, appreciate input.  See below.  Length of Stay: 4  Current Medications: Scheduled Meds:  . amiodarone  150 mg Intravenous Once  . apixaban  5 mg Oral BID  . ascorbic acid  500 mg Oral Daily  . atorvastatin  40 mg Oral QHS  . baricitinib  2 mg Oral Daily  . Chlorhexidine Gluconate Cloth  6 each Topical Daily  . finasteride  5 mg Oral Daily  . fluticasone furoate-vilanterol  1 puff Inhalation Daily  . insulin aspart  0-9 Units Subcutaneous TID WC  . insulin detemir  10 Units Subcutaneous Daily  . Ipratropium-Albuterol  1 puff Inhalation Q6H  . levothyroxine  75 mcg Oral QAC breakfast  . linagliptin  5 mg Oral Daily  . methylPREDNISolone (SOLU-MEDROL) injection  60 mg Intravenous Q12H  . metoprolol tartrate  50 mg Oral BID  . multivitamin with minerals  1 tablet Oral Daily  . omega-3 acid ethyl esters  1 g Oral BID  . pantoprazole  40 mg Oral QAC breakfast  . sodium chloride flush  3 mL Intravenous Q12H  . tamsulosin  0.4 mg Oral Daily     Continuous Infusions: . amiodarone     Followed by  . amiodarone    . remdesivir 100 mg in NS 100 mL 100 mg (11/22/19 1102)    PRN Meds: acetaminophen, Ipratropium-Albuterol, morphine injection, ondansetron **OR** ondansetron (ZOFRAN) IV, oxyCODONE, polyethylene glycol, traZODone  Physical Exam         Patient was seen and examined after appropriate PPE was  donned. Elderly gentleman sitting up in bed he is eating breakfast however at times is having weak coughing spells Patient has diffuse crackles bilaterally on auscultation S1-S2 Abdomen is not distended Skin is warm and dry no rashes No coolness no mottling Awake alert has expressive aphasia but moves extremities and follows commands  Vital Signs: BP 106/67 (BP Location: Left Arm)   Pulse (!) 106   Temp (!) 97.5 F (36.4 C) (Oral)   Resp (!) 22   Ht 5\' 8"  (1.727 m)   Wt 58.6 kg   SpO2 (!) 89%   BMI 19.63 kg/m  SpO2: SpO2: (!) 89 % O2 Device: O2 Device: High Flow Nasal Cannula O2 Flow Rate: O2 Flow Rate (L/min): 11 L/min  Intake/output summary:   Intake/Output Summary (Last 24 hours) at 11/22/2019 1106 Last data filed at 11/22/2019 3557 Gross per 24 hour  Intake 120 ml  Output 1705 ml  Net -1585 ml   LBM:   Baseline Weight: Weight: 68.9 kg Most recent weight: Weight: 58.6 kg       Palliative Assessment/Data:      Patient Active Problem List   Diagnosis Date Noted  . Acute hypoxemic respiratory failure due to COVID-19 ([place]) 11/26/2019  . 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 Dysphagia due to recent stroke 10/02/2019  . Urinary tract infection 10/02/2019  . Agitation 10/02/2019  . AKI (acute kidney injury) ([place]) 10/02/2019  . Prostate cancer ([place]) 09/24/2019  . 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 Collapse 09/24/2019  . Weakness 09/24/2019  . Middle cerebral artery embolism, left 09/24/2019  . Peripheral  neuropathy 03/21/2018  . Hammer toes of both feet 03/21/2018  . Lightheadedness 09/20/2017  . Hypothyroidism 09/19/2017  . Heel pain, chronic, right 03/09/2016  . GERD (gastroesophageal reflux disease) 08/23/2015  . Iron deficiency anemia 08/23/2015  . Mild persistent chronic asthma without complication 32/20/2542  . CAD (coronary artery disease)   . PAF (paroxysmal atrial fibrillation) ([place])   . History of amiodarone therapy 01/23/2014  . Non-STEMI (non-ST elevated myocardial infarction) ([place]) 01/23/2014  . 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 Pleural effusion 08/20/2013  . Syncope and collapse 08/20/2013  . Multiple fractures of ribs of left side 08/18/2013  . Chronic sinusitis 08/18/2013  . Enlarged prostate   . 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 Chronic systolic heart failure ([place]) 04/10/2011  . Hyperlipidemia 12/09/2010  . Ischemic cardiomyopathy 11/08/2010  . Diabetes mellitus ([place]) 11/08/2010  . Coronary artery disease 11/07/2010  . Extrinsic asthma 01/17/2010  . Essential hypertension 03/20/2007  . PULMONARY NODULE, RIGHT MIDDLE LOBE 03/20/2007    Palliative Care Assessment & Plan   Patient Profile:    Assessment: Acute hypoxic respiratory failure due to COVID-19 pneumonia Chronic diastolic congestive heart failure History of stroke and resultant aphasia History of prostate cancer Past medical history of hypertension diabetes and dyslipidemia  Recommendations/Plan:  Patient remains on all of the necessary COVID-19 treatment regimen.  Additionally also on cardiac medications including diuretics.  Add PRN IV Morphine, patient has PO opioids available as well  Monitor hospital course and overall disease trajectory of illness to continue goals of care conversations with [facility] Gerald Rosario.   Goals of Care and Additional Recommendations:  Limitations on Scope of Treatment:    Code Status:    Code  Status Orders  (From admission, onward)  Start     Ordered   11/19/19 0330  Do not attempt resuscitation (DNR)  Continuous       Question Answer Comment  In the event of cardiac or respiratory ARREST Do not call a "code blue"   In the event of cardiac or respiratory ARREST Do not perform Intubation, CPR, defibrillation or ACLS   In the event of cardiac or respiratory ARREST Use medication by any route, position, wound care, and other measures to relive pain and suffering. May use oxygen, suction and manual treatment of airway obstruction as needed for comfort.      11/19/19 0330        Code Status History    Date Active Date Inactive Code Status Order ID Comments User Context   11/17/2019 1812 11/19/2019 0330 DNR [id number]  Lennice Sites, DO ED   10/01/2019 1710 10/07/2019 2016 DNR [id number]  Rosalin Hawking, MD Inpatient   09/24/2019 1502 09/25/2019 1555 Full Code [id number]  Luanne Bras, MD Inpatient   09/24/2019 1118 09/24/2019 1502 Full Code [id number]  Marliss Coots, PA-C ED   01/26/2014 1509 01/27/2014 1513 Full Code [id number]  [name], MD Inpatient   01/23/2014 1525 01/26/2014 1509 Full Code [id number]  Barrett, Evelene Croon, PA-C Inpatient   08/28/2013 1715 09/08/2013 1855 Full Code [id number]  Rexene Alberts, MD Inpatient   08/26/2013 1800 08/28/2013 1715 Full Code [id number]  [name], MD Inpatient   08/20/2013 1714 08/26/2013 1800 Full Code [id number]  Evans Lance, MD Inpatient   08/18/2013 1258 08/20/2013 1714 Full Code [id number]  Charlann Lange, MD ED   12/26/2011 1144 12/27/2011 1329 Full Code [id number]  Arletha Pili, RN Inpatient   Advance Care Planning Activity       Prognosis:   Unable to determine  Discharge Planning:  To Be Determined  Care plan was discussed with patient, rapid response as well as bedside staff.  Thank you for allowing the Palliative Medicine Team to assist in the care of this patient.   Time In:  11 Time Out:  11.35 Total Time   35 Prolonged Time Billed  no       Greater than 50%  of this time was spent counseling and coordinating care related to the above assessment and plan.  Loistine Chance, MD  Please contact Palliative Medicine Team phone at [phone] for questions and concerns.

## 2019-11-22 NOTE — Progress Notes (Signed)
Patient noted to have MEWS score of 2. Dr. Bonner Puna notified new orders received.

## 2019-11-22 NOTE — Progress Notes (Signed)
CRITICAL VALUE ALERT  Critical Value:  MRSA +  Date & Time Notied:  11/22/19  Orders Received/Actions taken: MRSA + order set started.

## 2019-11-22 NOTE — Progress Notes (Signed)
Received verbal orders to place Amiodarone on hold at this time pending reduction in BP post administration of metoprolol.  Pending consultation with MD, Amiodarone may be restarted at 30mg /hr if patient's HR is sustaining above 110, and BP is appropriate for administration.  Patient is on continuous telemetry, with BP Q1H. Contacted tele to set HR alert parameter of 110 BPM.

## 2019-11-22 NOTE — Progress Notes (Signed)
Weekend CSW reviewed chart for possible D/C date and messaged MD whom states D/C date on Wed 8/24 at the earliest.  Please reconsult if future social work needs arise.  CSW signing off, as social work intervention is no longer needed.  Alphonse Guild. Lanise Mergen  MSW, LCSW, LCAS, CCS Transitions of Care Clinical Social Worker Care Coordination Department Ph: [phone]

## 2019-11-22 NOTE — Progress Notes (Signed)
OT Cancellation Note  Patient Details Name: TYTON ABDALLAH MRN: [id number] DOB: October 09, 1945   Cancelled Treatment:    Reason Eval/Treat Not Completed: Other (comment) (RN reports to hold session at this time.) OT will continue to follow once able to participate.   Minus Breeding, MSOT, OTR/L  Supplemental Rehabilitation Services  [phone]   Marius Ditch 11/22/2019, 3:15 PM

## 2019-11-23 ENCOUNTER — Inpatient Hospital Stay (HOSPITAL_COMMUNITY): Payer: No Typology Code available for payment source

## 2019-11-23 LAB — COMPREHENSIVE METABOLIC PANEL
ALT: 16 U/L (ref 0–44)
AST: 23 U/L (ref 15–41)
Albumin: 2.4 g/dL — ABNORMAL LOW (ref 3.5–5.0)
Alkaline Phosphatase: 75 U/L (ref 38–126)
Anion gap: 11 (ref 5–15)
BUN: 52 mg/dL — ABNORMAL HIGH (ref 8–23)
CO2: 22 mmol/L (ref 22–32)
Calcium: 9.4 mg/dL (ref 8.9–10.3)
Chloride: 104 mmol/L (ref 98–111)
Creatinine, Ser: 1.22 mg/dL (ref 0.61–1.24)
GFR calc Af Amer: >60 mL/min (ref >60)
GFR calc non Af Amer: 58 mL/min — ABNORMAL LOW (ref >60)
Glucose, Bld: 107 mg/dL — ABNORMAL HIGH (ref 70–99)
Potassium: 4.4 mmol/L (ref 3.5–5.1)
Sodium: 137 mmol/L (ref 135–145)
Total Bilirubin: 0.8 mg/dL (ref 0.3–1.2)
Total Protein: 6.2 g/dL — ABNORMAL LOW (ref 6.5–8.1)

## 2019-11-23 LAB — GLUCOSE, CAPILLARY
Glucose-Capillary: 109 mg/dL — ABNORMAL HIGH (ref 70–99)
Glucose-Capillary: 157 mg/dL — ABNORMAL HIGH (ref 70–99)
Glucose-Capillary: 97 mg/dL (ref 70–99)
Glucose-Capillary: 106 mg/dL — ABNORMAL HIGH (ref 70–99)

## 2019-11-23 LAB — MAGNESIUM: Magnesium: 2.2 mg/dL (ref 1.7–2.4)

## 2019-11-23 LAB — CBC WITH DIFFERENTIAL/PLATELET
Abs Immature Granulocytes: 0.25 10*3/uL — ABNORMAL HIGH (ref 0.00–0.07)
Basophils Absolute: 0 10*3/uL (ref 0.0–0.1)
Basophils Relative: 0 %
Eosinophils Absolute: 0 10*3/uL (ref 0.0–0.5)
Eosinophils Relative: 0 %
HCT: 32.4 % — ABNORMAL LOW (ref 39.0–52.0)
Hemoglobin: 10.4 g/dL — ABNORMAL LOW (ref 13.0–17.0)
Immature Granulocytes: 1 %
Lymphocytes Relative: 1 %
Lymphs Abs: 0.2 10*3/uL — ABNORMAL LOW (ref 0.7–4.0)
MCH: 28.4 pg (ref 26.0–34.0)
MCHC: 32.1 g/dL (ref 30.0–36.0)
MCV: 88.5 fL (ref 80.0–100.0)
Monocytes Absolute: 2.2 10*3/uL — ABNORMAL HIGH (ref 0.1–1.0)
Monocytes Relative: 8 %
Neutro Abs: 23.4 10*3/uL — ABNORMAL HIGH (ref 1.7–7.7)
Neutrophils Relative %: 90 %
Platelets: 435 10*3/uL — ABNORMAL HIGH (ref 150–400)
RBC: 3.66 MIL/uL — ABNORMAL LOW (ref 4.22–5.81)
RDW: 14.3 % (ref 11.5–15.5)
WBC: 26.1 10*3/uL — ABNORMAL HIGH (ref 4.0–10.5)
nRBC: 0 % (ref 0.0–0.2)

## 2019-11-23 LAB — D-DIMER, QUANTITATIVE: D-Dimer, Quant: 2.4 ug/mL-FEU — ABNORMAL HIGH (ref 0.00–0.50)

## 2019-11-23 LAB — CULTURE, BLOOD (ROUTINE X 2)
Culture: NO GROWTH
Culture: NO GROWTH
Special Requests: ADEQUATE
Special Requests: ADEQUATE

## 2019-11-23 LAB — TSH: TSH: 1.091 u[IU]/mL (ref 0.350–4.500)

## 2019-11-23 LAB — C-REACTIVE PROTEIN: CRP: 16.9 mg/dL — ABNORMAL HIGH (ref <1.0)

## 2019-11-23 LAB — PROCALCITONIN: Procalcitonin: 0.26 ng/mL

## 2019-11-23 MED ORDER — DEXTROSE-NACL 5-0.45 % IV SOLN: INTRAVENOUS | Status: DC

## 2019-11-23 MED ORDER — METOPROLOL TARTRATE 5 MG/5ML IV SOLN
2.5000 mg | Freq: Once | INTRAVENOUS | Status: AC
  Administered 2019-11-23: 2.5 mg via INTRAVENOUS
  Filled 2019-11-23: qty 5

## 2019-11-23 MED ORDER — METHYLPREDNISOLONE SODIUM SUCC 125 MG IJ SOLR
40.0000 mg | Freq: Two times a day (BID) | INTRAMUSCULAR | Status: DC
  Administered 2019-11-23 – 2019-11-24 (×2): 40 mg via INTRAVENOUS
  Filled 2019-11-23 (×2): qty 2

## 2019-11-23 MED ORDER — MORPHINE SULFATE (PF) 2 MG/ML IV SOLN
1.0000 mg | INTRAVENOUS | Status: DC | PRN
  Administered 2019-11-23 – 2019-11-24 (×3): 2 mg via INTRAVENOUS
  Filled 2019-11-23 (×3): qty 1

## 2019-11-23 MED ORDER — ACETAMINOPHEN 10 MG/ML IV SOLN
1000.0000 mg | Freq: Four times a day (QID) | INTRAVENOUS | Status: AC
  Administered 2019-11-23 – 2019-11-24 (×4): 1000 mg via INTRAVENOUS
  Filled 2019-11-23 (×4): qty 100

## 2019-11-23 MED ORDER — ENOXAPARIN SODIUM 60 MG/0.6ML ~~LOC~~ SOLN
60.0000 mg | Freq: Two times a day (BID) | SUBCUTANEOUS | Status: DC
  Administered 2019-11-23 – 2019-11-24 (×2): 60 mg via SUBCUTANEOUS
  Filled 2019-11-23 (×2): qty 0.6

## 2019-11-23 MED ORDER — SODIUM CHLORIDE 0.9 % IV SOLN
3.0000 g | Freq: Four times a day (QID) | INTRAVENOUS | Status: DC
  Administered 2019-11-23 – 2019-11-24 (×5): 3 g via INTRAVENOUS
  Filled 2019-11-23 (×2): qty 3
  Filled 2019-11-23: qty 8
  Filled 2019-11-23 (×3): qty 3

## 2019-11-23 NOTE — Progress Notes (Signed)
Contacted by [facility] FNP, advised that "patient is due for Metoprolol this am, would favor holding off the gtt and continue with po rate control meds for now. Cardiology consult may be beneficial to assit with management."

## 2019-11-23 NOTE — Progress Notes (Signed)
PROGRESS NOTE  DRYDEN TAPLEY  PZW:[id number] DOB: [birth year] DOA: 11/08/2019 PCP: Binnie Rail, MD  Outpatient Specialists: [facility] Brief Narrative: Gerald Rosario is a 74 y.o. male with history of prostate cancer, CABG x3, paroxysmal A. fib, ischemic cardiomyopathy, hypertension, hyperlipidemia's, diabetes, who presents from skilled nursing facility with worsening hypoxia in the setting of known Covid infection.  Patient recently admitted to [facility] from June 23 to July 6 for acute stroke due to occlusion of left ICA and MCA.  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  He was discharged to a SNF ([facility]), he was made DNR prior to discharge.  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  Per ED providers discussion with power of attorney patient was diagnosed with Covid approximately 2 weeks ago at his SNF, started on oxygen several days ago although the exact timing is unclear and was also treated with antibiotics.  He did not receive any Covid vaccination.  Patient began to have escalating oxygen requirements which prompted transfer to the emergency department.   Review of outside records showed that he was given 5 days of p.o. Decadron, as well as amoxicillin and azithromycin while at the nursing home.  Arrival to the ED patient was initially hypoxic to the 80s but with then placed on 6 L of oxygen with improvement in saturation.  Labs notable for positive Covid test, elevated inflammatory markers, normal procalcitonin, CBC with elevated white count of 14 and improving anemia with hemoglobin 11.7, platelets of 496, as well as elevated lactic acid of 3.2  He was given IV Decadron remdesivir in the ED and admitted for further management of his acute hypoxemic respiratory failure secondary to Covid infection.  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 Said to have been positive weeks before. Lymphopenic with elevated CRP 13.5 on arrival.  - Continue supplemental oxygen. Aim to keep SpO2 >90% for now. With severity and significant number of comorbidities, a poor prognosis is expected. Palliative care is following. DNR is confirmed.  - Completed remdesivir (8/17 - 8/21) - Continue steroids. Was making gains until possible aspiration event 8/21, so will decrease steroids - Will hold baricitinib, given 8/19 - 8/22, due to suspicion for aspiration pneumonia.  - D-dimer monitoring, continue full dose eliquis at this time.  - 21 day isolation recommended, unclear when initial positive test, requested that records be obtained from SNF.   Aspiration pneumonitis and aspiration pneumonia: PCT starting to rise, CRP up without alternative explanation.  - DC baricitinib, start unasyn, aspiration precautions and SLP evaluation as below  Chronic diastolic CHF (grade III diastolic dysfunction on echo June 2021), ICM, paroxysmal AFib, CAD s/p CABG: BNP 558. Though previous ProBNPs  were in 5000's. - Continue po metoprolol which has controlled HR, suspect aspiration contributed to hypotension, RVR. Can stop amiodarone. - Holding home lasix with such little po intake. In fact, will touch base  with RN later today. If no PO intake, will need to start low level fluids.  - On eliquis.  Hyperkalemia: Resolved.  History of CVA with persistent aphasia: Pt able to understand much of what is said to him. - ASA, statin - Downgrade to dysphagia diet with aspiration precautions. SLP evaluation requested.    Prostate CA: Dr. Alvy Bimler reached out as she was reviewing outpatient referrals. Will need to delay this at this time.  T2DM: At inpatient goal currently - Continue low dose basal levemir and and continue SSI. - Continue linagliptin.    Hypertension:  - Holding amlodipine  Hyperlipidemia:  - Continue atorvastatin  Asthma: No wheezing.  - Continue symbicort, combivent  BPH:  - Continue finasteride and tamsulosin  Hypothyroidism:  - Continue synthroid  GERD:  - Continue PPI  DVT prophylaxis: Eliquis Code Status: DNR, POA Family Communication: HCPOA by phone daily Disposition Plan:  Status is: Inpatient  Remains inpatient appropriate because:Inpatient level of care appropriate due to severity of illness   Dispo: The patient is from: SNF Per power of attorney he is very unhappy with care at current nursing home, consult transitions of care for dispo planning to alternative facility              Anticipated d/c is to: SNF              Anticipated d/c date is: > 3 days pending clinical trajectory. Continues to have guarded prognosis.              Patient currently is not medically stable to d/c.  Consultants:   None  Procedures:   None  Antimicrobials:  Remdesivir 8/17 - 8/21  Unasyn 8/22 >>  Subjective: More stable vital signs overnight, still not taking much by mouth. Does not communicate that he is in pain or having difficulty breathing.  Objective: Vitals:   11/23/19 0900 11/23/19 1000 11/23/19 1100 11/23/19 1211  BP: (!) 110/53 110/78 111/61 111/74  Pulse: (!) 101 92 88   Resp: (!) 22 (!) 29 (!) 26 (!) 22  Temp: 98.7 F (37.1 C)   98.8 F (37.1  C)  TempSrc: Axillary   Oral  SpO2: 93% 97% 98% 94%  Weight:      Height:        Intake/Output Summary (Last 24 hours) at 11/23/2019 1438 Last data filed at 11/23/2019 1110 Gross per 24 hour  Intake 692.15 ml  Output 1050 ml  Net -357.85 ml   Gen: 74 y.o. male in no distress Pulm: Tachypneic with coarse breath sounds diffusely. CV: Regular rate and rhythm. No murmur, rub, or gallop. No JVD, no dependent edema. GI: Abdomen soft, non-tender, non-distended, with normoactive bowel sounds.  Ext: Warm, no deformities Skin: No new rashes, lesions or ulcers on visualized skin. Neuro: Alert and follows commands, able to move all extremities. Psych: Judgement and insight suspected to be intact, limited by aphasia which is stable.  Data Reviewed: I have personally reviewed following labs and imaging studies  CBC: Recent Labs  Lab 11/19/19 0400 11/20/19 0541 11/21/19 0415 11/22/19 0356 11/23/19 0518  WBC 6.5 17.0* 28.0* 24.9* 26.1*  NEUTROABS 5.9 15.7* 26.1* 22.7* 23.4*  HGB 10.2* 10.4* 9.2* 9.2* 10.4*  HCT 32.1* 31.9* 28.3* 28.6* 32.4*  MCV 88.9 88.9 87.9  89.9 88.5  PLT 409* 375 383 333 545*   Basic Metabolic Panel: Recent Labs  Lab 11/19/19 0400 11/20/19 0541 11/21/19 0415 11/22/19 0356 11/23/19 0518  NA 138 137 138 133* 137  K 5.4* 4.5 4.9 4.8 4.4  CL 107 106 104 101 104  CO2 23 23 22 23 22   GLUCOSE 326* 228* 144* 183* 107*  BUN 33* 31* 38* 52* 52*  CREATININE 0.95 0.85 1.03 1.19 1.22  CALCIUM 9.4 9.8 9.7 8.9 9.4  MG 2.3 2.1 2.0 2.2 2.2  PHOS 3.7 3.4 3.3  --   --    GFR: Estimated Creatinine Clearance: 43.4 mL/min (by C-G formula based on SCr of 1.22 mg/dL). Liver Function Tests: Recent Labs  Lab 11/19/19 0400 11/20/19 0541 11/21/19 0415 11/22/19 0356 11/23/19 0518  AST 24 22 20 18 23   ALT 24 23 17 15 16   ALKPHOS 58 59 56 59 75  BILITOT 0.7 1.2 1.0 0.9 0.8  PROT 6.7 6.6 5.8* 5.6* 6.2*  ALBUMIN 2.7* 2.7* 2.4* 2.3* 2.4*   No results for input(s):  LIPASE, AMYLASE in the last 168 hours. No results for input(s): AMMONIA in the last 168 hours. Coagulation Profile: No results for input(s): INR, PROTIME in the last 168 hours. Cardiac Enzymes: No results for input(s): CKTOTAL, CKMB, CKMBINDEX, TROPONINI in the last 168 hours. BNP (last 3 results) No results for input(s): PROBNP in the last 8760 hours. HbA1C: No results for input(s): HGBA1C in the last 72 hours. CBG: Recent Labs  Lab 11/22/19 0754 11/22/19 1139 11/22/19 1721 11/23/19 0724 11/23/19 1120  GLUCAP 185* 262* 241* 109* 157*   Lipid Profile: No results for input(s): CHOL, HDL, LDLCALC, TRIG, CHOLHDL, LDLDIRECT in the last 72 hours. Thyroid Function Tests: Recent Labs    11/23/19 0518  TSH 1.091   Anemia Panel: Recent Labs    11/21/19 0415  FERRITIN 718*   Urine analysis:    Component Value Date/Time   COLORURINE YELLOW 09/28/2019 1524   APPEARANCEUR CLOUDY (A) 09/28/2019 1524   LABSPEC 1.008 09/28/2019 1524   PHURINE 6.0 09/28/2019 1524   GLUCOSEU >=500 (A) 09/28/2019 1524   HGBUR SMALL (A) 09/28/2019 1524   BILIRUBINUR NEGATIVE 09/28/2019 1524   [place] 09/28/2019 1524   PROTEINUR NEGATIVE 09/28/2019 1524   UROBILINOGEN 1.0 09/02/2013 1838   NITRITE NEGATIVE 09/28/2019 1524   LEUKOCYTESUR LARGE (A) 09/28/2019 1524   Recent Results (from the past 240 hour(s))  Blood Culture (routine x 2)     Status: None   Collection Time: 11/15/2019  4:43 PM   Specimen: BLOOD  Result Value Ref Range Status   Specimen Description   Final    BLOOD RIGHT ANTECUBITAL Performed at [facility], [place] [address] [place], [place] [address]    Special Requests   Final    BOTTLES DRAWN AEROBIC AND ANAEROBIC Blood Culture adequate volume Performed at [place] [address] [place], [place] [address]    Culture   Final    NO GROWTH 5 DAYS Performed at [facility], [place] [address], [place], [place] [address]    Report  Status 11/23/2019 FINAL  Final  SARS Coronavirus 2 by RT PCR (hospital order, performed in [facility] hospital lab) Nasopharyngeal Nasopharyngeal Swab     Status: Abnormal   Collection Time: 11/03/2019  6:26 PM   Specimen: Nasopharyngeal Swab  Result Value Ref Range Status   SARS Coronavirus 2 POSITIVE (A) NEGATIVE Final    Comment:  RESULT CALLED TO, READ BACK BY AND VERIFIED WITH: BROOKS,B. RN @1924  11/23/2019 BILLINGSLEY,L (NOTE) SARS-CoV-2 target nucleic acids are DETECTED  SARS-CoV-2 RNA is generally detectable in upper respiratory specimens  during the acute phase of infection.  Positive results are indicative  of the presence of the identified virus, but do not rule out bacterial infection or co-infection with other pathogens not detected by the test.  Clinical correlation with patient history and  other diagnostic information is necessary to determine patient infection status.  The expected result is negative.  Fact Sheet for Patients:   StrictlyIdeas.no   Fact Sheet for Healthcare Providers:   BankingDealers.co.za    This test is not yet approved or cleared by the [place] FDA and  has been authorized for detection and/or diagnosis of SARS-CoV-2 by FDA under an Emergency Use Authorization (EUA).  This EUA will remain in effect (meaning t his test can be used) for the duration of  the COVID-19 declaration under Section 564(b)(1) of the Act, 21 U.S.C. section 360-bbb-3(b)(1), unless the authorization is terminated or revoked sooner.  Performed at [facility], [place] [address], [place], [place] [address]   Blood Culture (routine x 2)     Status: None   Collection Time: 11/15/2019  6:29 PM   Specimen: BLOOD  Result Value Ref Range Status   Specimen Description   Final    BLOOD LEFT ANTECUBITAL Performed at [facility], [place] [address], [place], [place] [address]    Special Requests   Final    BOTTLES  DRAWN AEROBIC AND ANAEROBIC Blood Culture adequate volume Performed at [place] [address], [place], [place] [address]    Culture   Final    NO GROWTH 5 DAYS Performed at [facility], [place] [address] [place], [place] [address]    Report Status 11/23/2019 FINAL  Final  MRSA PCR Screening     Status: Abnormal   Collection Time: 11/22/19  4:17 PM   Specimen: Nasal Mucosa; Nasopharyngeal  Result Value Ref Range Status   MRSA by PCR POSITIVE (A) NEGATIVE Final    Comment:        The GeneXpert MRSA Assay (FDA approved for NASAL specimens only), is one component of a comprehensive MRSA colonization surveillance program. It is not intended to diagnose MRSA infection nor to guide or monitor treatment for MRSA infections. RESULT CALLED TO, READ BACK BY AND VERIFIED WITH: M,STEPHEN AT 2114 ON 11/22/19 BY A,MOHAMED Performed at [facility], [place] [address], [place], [place] [address]       Radiology Studies: DG CHEST PORT 1 VIEW  Result Date: 11/23/2019 CLINICAL DATA:  74 year old male with history of COVID infection. EXAM: PORTABLE CHEST 1 VIEW COMPARISON:  Chest x-ray 11/22/2019. FINDINGS: Patchy multifocal areas of interstitial prominence and airspace disease are noted throughout the lungs bilaterally, most severe throughout the periphery of the right lung. Overall, aeration appears similar to slightly worsened compared to the prior examination. No pleural effusions. No evidence of pulmonary edema. Heart size is borderline enlarged. Upper mediastinal contours are within normal limits. Status post median sternotomy for CABG and left atrial appendage ligation. Aortic atherosclerosis. IMPRESSION: 1. Multilobar bilateral pneumonia redemonstrated compatible with reported COVID infection. Overall, aeration appears similar to slightly worsened compared to the prior examination. 2. Aortic atherosclerosis. Electronically Signed   By: Vinnie Langton  M.D.   On: 11/23/2019 06:01   DG CHEST PORT 1 VIEW  Result Date:  11/22/2019 CLINICAL DATA:  Respiratory disease due to COVID-19. EXAM: PORTABLE CHEST 1 VIEW COMPARISON:  November 20, 2019 FINDINGS: Right greater than left pulmonary infiltrates remain, similar in the interval. Stable cardiomediastinal silhouette. No pneumothorax. No other change. IMPRESSION: Right greater than left pulmonary infiltrates remain, similar in the interval. Electronically Signed   By: Dorise Bullion III M.D   On: 11/22/2019 10:23    Scheduled Meds:  apixaban  5 mg Oral BID   ascorbic acid  500 mg Oral Daily   atorvastatin  40 mg Oral QHS   baricitinib  2 mg Oral Daily   Chlorhexidine Gluconate Cloth  6 each Topical Daily   finasteride  5 mg Oral Daily   fluticasone furoate-vilanterol  1 puff Inhalation Daily   insulin aspart  0-9 Units Subcutaneous TID WC   insulin detemir  10 Units Subcutaneous Daily   Ipratropium-Albuterol  1 puff Inhalation Q6H   levothyroxine  75 mcg Oral QAC breakfast   linagliptin  5 mg Oral Daily   methylPREDNISolone (SOLU-MEDROL) injection  60 mg Intravenous Q12H   metoprolol tartrate  50 mg Oral BID   multivitamin with minerals  1 tablet Oral Daily   mupirocin ointment  1 application Nasal BID   omega-3 acid ethyl esters  1 g Oral BID   pantoprazole  40 mg Oral QAC breakfast   sodium chloride flush  3 mL Intravenous Q12H   tamsulosin  0.4 mg Oral Daily   Continuous Infusions:  ampicillin-sulbactam (UNASYN) IV       LOS: 5 days   Time spent: 35 minutes.  Patrecia Pour, MD [facility] [email or web address] 11/23/2019, 2:38 PM

## 2019-11-23 NOTE — Progress Notes (Signed)
Pharmacy Antibiotic Note  Gerald Rosario is a 74 y.o. male admitted on 11/11/2019 with COVID PNA.  Pharmacy has been consulted for ampicillin/sulbactam dosing.  Patient admitted for COVID PNA, completed course of remdesivir on 8/21. PCT was negative on admisson, pt started on baricitinib on 8/19. PCT now 0.26, ampicillin/sulbactam being initiated for possible aspiration PNA. Baricitinib has been discontinued.    Today, 11/23/19  WBC 26.1, elevated, on high dose steroids  SCr 1.22, CrCl 43 mL/min  PCT <0.10 --> 0.26  Afebrile  Plan:  Ampicillin/sulbactam 3 g IV q6h  Follow renal function  Height: 5\' 8"  (172.7 cm) Weight: 58.6 kg (129 lb 1.6 oz) IBW/kg (Calculated) : 68.4  Temp (24hrs), Avg:98 F (36.7 C), Min:97.6 F (36.4 C), Max:98.8 F (37.1 C)  Recent Labs  Lab 11/04/2019 1829 11/05/2019 1829 11/28/2019 2209 11/19/19 0400 11/20/19 0541 11/21/19 0415 11/22/19 0356 11/23/19 0518  WBC 14.0*   < >  --  6.5 17.0* 28.0* 24.9* 26.1*  CREATININE 1.09   < >  --  0.95 0.85 1.03 1.19 1.22  LATICACIDVEN 3.2*  --  1.3  --   --   --   --   --    < > = values in this interval not displayed.    Estimated Creatinine Clearance: 43.4 mL/min (by C-G formula based on SCr of 1.22 mg/dL).    Allergies  Allergen Reactions  . Sulfa Antibiotics Swelling and Other (See Comments)    Possible angioedema, per the patient's daughter (ICU RN)    Antimicrobials this admission: Ampicillin/sulbactam 8/22 >>  remdesivir 8/17 >> 8/21  Dose adjustments this admission:  Microbiology results: 8/17 BCx: ngF  Thank you for allowing pharmacy to be a part of this patient's care.  Lenis Noon, PharmD, BCPS 11/23/2019 4:16 PM

## 2019-11-23 NOTE — Progress Notes (Signed)
Patient noted to be lethargic and less interactive with staff this evening at assessment. Elevated concerns to charge and rapid response nurse who came and assessed patient. Determined that change in mentation is likely due to a fever at this time, and discussed alternative therapies with the on call [place] NP. Current route of care determined appropriate with the addition of cooling packs and switch from PO to IV medications due to increased lethargy and risk of aspiration. Patient was placed on RED MEWS protocol, and some improvement was noted to rectal temp down from 102.2 to 101.1 post 1 hour cooling pack application. Patient was turned and cooling packs were repositioned, notable difference in lethargy with movement of patient for repositioning.

## 2019-11-23 NOTE — Progress Notes (Signed)
Per verbal order will contact MD if HR sustaining >120 BPM

## 2019-11-23 NOTE — Evaluation (Signed)
Occupational Therapy Evaluation Patient Details Name: Gerald Rosario MRN: [id number] DOB: Jul 25, 1945 Today's Date: 11/23/2019    History of Present Illness Gerald Rosario is a 74 y.o. male with history of prostate cancer, CABG x3, paroxysmal A. fib, ischemic cardiomyopathy, hypertension, hyperlipidemia's, diabetes, who presents from skilled nursing facility with worsening hypoxia in the setting of known Covid infection. Patient recently admitted to [facility] from June 23 to July 6 for acute stroke due to occlusion of left ICA and MCA.  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   Clinical Impression   Pt with global aphasia, R hemiparesis, R inattention and motor planning deficits from his recent CVA. He is currently on 10L 02 with increased WOB, afib with HR in upper 90s and Sp02 92%. Pt requires min assist for bed mobility and transfers and min to total assist for ADL. He declined OOB to chair, positioned pt upright in bed. Will follow acutely. Recommend return to SNF upon discharge.    Follow Up Recommendations  SNF;Supervision/Assistance - 24 hour    Equipment Recommendations  Other (comment) (defer to next venue)    Recommendations for Other Services       Precautions / Restrictions Precautions Precautions: Fall Precaution Comments: monitor 02 and HR      Mobility Bed Mobility Overal bed mobility: Needs Assistance Bed Mobility: Supine to Sit;Sit to Supine     Supine to sit: Min assist Sit to supine: Min assist   General bed mobility comments: min assist for R LE with HOB up  Transfers Overall transfer level: Needs assistance Equipment used: 1 person hand held assist Transfers: Sit to/from [facility] Sit to Stand: Min assist Stand pivot transfers: Min assist       General transfer comment: assist to rise and steady, transferred to chair, but immediately attempting to return to bed    Balance  Overall balance assessment: Needs assistance   Sitting balance-Leahy Scale: Good     Standing balance support: Single extremity supported Standing balance-Leahy Scale: Fair                             ADL either performed or assessed with clinical judgement   ADL Overall ADL's : Needs assistance/impaired Eating/Feeding: Minimal assistance;Sitting Eating/Feeding Details (indicate cue type and reason): with L hand lead Grooming: Wash/dry hands;Sitting;Minimal assistance Grooming Details (indicate cue type and reason): with L hand, decreased thoroughness Upper Body Bathing: Maximal assistance;Sitting   Lower Body Bathing: Total assistance;Sit to/from stand   Upper Body Dressing : Maximal assistance;Sitting   Lower Body Dressing: Total assistance;Sit to/from stand   Toilet Transfer: Minimal assistance;Stand-pivot                   Financial planner Tested?: Yes Perception Deficits: Inattention/neglect Inattention/Neglect: Does not attend to right side of body   Praxis Praxis Praxis tested?: Deficits Deficits: Motor Impersistence;Ideation    Pertinent Vitals/Pain Pain Assessment: Faces Faces Pain Scale: No hurt     Hand Dominance Right   Extremity/Trunk Assessment Upper Extremity Assessment Upper Extremity Assessment: RUE deficits/detail RUE Deficits / Details: muscle tone WFL, no functional use observed, unable to formally test due to impaired communication RUE Coordination: decreased gross motor;decreased fine motor   Lower Extremity Assessment Lower Extremity Assessment: Defer to PT evaluation       Communication Communication Communication: Receptive difficulties;Expressive  difficulties (global aphasia)   Cognition Arousal/Alertness: Awake/alert Behavior During Therapy: Flat affect;Impulsive Overall Cognitive Status: Difficult to assess                                 General Comments:  repeately "shooing" me away or holding my hand with his L    General Comments       Exercises     Shoulder Instructions      Home Living Family/patient expects to be discharged to:: Skilled nursing facility                                 Additional Comments: pt from SNF per chart, unable to report PLOF due to aphasia      Prior Functioning/Environment Level of Independence: Needs assistance    ADL's / Homemaking Assistance Needed: likely assisted for ADL            OT Problem List: Decreased strength;Impaired balance (sitting and/or standing);Decreased activity tolerance;Decreased coordination;Decreased cognition;Decreased safety awareness;Decreased knowledge of use of DME or AE;Impaired UE functional use      OT Treatment/Interventions: Self-care/ADL training;Neuromuscular education;DME and/or AE instruction;Therapeutic activities;Cognitive remediation/compensation;Patient/family education;Balance training;Visual/perceptual remediation/compensation    OT Goals(Current goals can be found in the care plan section) Acute Rehab OT Goals Patient Stated Goal: unable to state OT Goal Formulation: Patient unable to participate in goal setting Time For Goal Achievement: 12/07/19 Potential to Achieve Goals: Good ADL Goals Pt Will Perform Eating: with set-up;sitting (L hand leading) Pt Will Perform Grooming: with min assist;standing Pt Will Perform Upper Body Dressing: with min assist;sitting Pt Will Transfer to Toilet: with min assist;ambulating;bedside commode Additional ADL Goal #1: Pt will use R UE as a functional stabilizer during bimanual ADL with moderate verbal cues.  OT Frequency: Min 2X/week   Barriers to D/C:            Co-evaluation              AM-PAC OT "6 Clicks" Daily Activity     Outcome Measure Help from another person eating meals?: A Little Help from another person taking care of personal grooming?: A Lot Help from another person  toileting, which includes using toliet, bedpan, or urinal?: A Lot Help from another person bathing (including washing, rinsing, drying)?: A Lot Help from another person to put on and taking off regular upper body clothing?: A Lot Help from another person to put on and taking off regular lower body clothing?: Total 6 Click Score: 12   End of Session Equipment Utilized During Treatment: Oxygen;Gait belt (10L)  Activity Tolerance: Patient tolerated treatment well Patient left: in bed;with call bell/phone within reach;with bed alarm set  OT Visit Diagnosis: Unsteadiness on feet (R26.81);Hemiplegia and hemiparesis;Other abnormalities of gait and mobility (R26.89);Muscle weakness (generalized) (M62.81);Cognitive communication deficit (R41.841);Apraxia (R48.2) Symptoms and signs involving cognitive functions: Cerebral infarction Hemiplegia - Right/Left: Right Hemiplegia - dominant/non-dominant: Dominant Hemiplegia - caused by: Cerebral infarction                Time: 6256-3893 OT Time Calculation (min): 16 min Charges:  OT General Charges $OT Visit: 1 Visit OT Evaluation $OT Eval Moderate Complexity: 1 Mod  Nestor Lewandowsky, OTR/L Acute Rehabilitation Services Pager: [phone] Office: [phone]  Malka So 11/23/2019, 1:29 PM

## 2019-11-23 NOTE — Progress Notes (Signed)
Rn noted patient to have chills, temperature 100.4 axillary, patient refuses oral temp.  Notified MD of above  Oral Tylenol administered, will continue to monitor.

## 2019-11-23 NOTE — Progress Notes (Signed)
Daily Progress Note   Patient Name: Gerald Rosario       Date: 11/23/2019 DOB: October 06, 1945  Age: 74 y.o. MRN#: [id number] Attending Physician: Gerald Pour, MD Primary Care Physician: Gerald Rail, MD Admit Date: 11/24/2019  Reason for Consultation/Follow-up: Establishing goals of care  Subjective: Patient is now on the 4th floor, chart reviewed, discussed with bedside RN, Po intake is minimal, patient awake alert following commands as per bedside RN, continues to have coarse breath sounds, heart rate and BP is better, we discussed about PRN Morphine IV PRN for symptom management.    Length of Stay: 5  Current Medications: Scheduled Meds:  . apixaban  5 mg Oral BID  . ascorbic acid  500 mg Oral Daily  . atorvastatin  40 mg Oral QHS  . baricitinib  2 mg Oral Daily  . Chlorhexidine Gluconate Cloth  6 each Topical Daily  . finasteride  5 mg Oral Daily  . fluticasone furoate-vilanterol  1 puff Inhalation Daily  . insulin aspart  0-9 Units Subcutaneous TID WC  . insulin detemir  10 Units Subcutaneous Daily  . Ipratropium-Albuterol  1 puff Inhalation Q6H  . levothyroxine  75 mcg Oral QAC breakfast  . linagliptin  5 mg Oral Daily  . methylPREDNISolone (SOLU-MEDROL) injection  60 mg Intravenous Q12H  . metoprolol tartrate  50 mg Oral BID  . multivitamin with minerals  1 tablet Oral Daily  . mupirocin ointment  1 application Nasal BID  . omega-3 acid ethyl esters  1 g Oral BID  . pantoprazole  40 mg Oral QAC breakfast  . sodium chloride flush  3 mL Intravenous Q12H  . tamsulosin  0.4 mg Oral Daily    Continuous Infusions:   PRN Meds: acetaminophen, Ipratropium-Albuterol, morphine injection, ondansetron **OR** ondansetron (ZOFRAN) IV, oxyCODONE, polyethylene glycol,  traZODone  Physical Exam         The above conversation was completed by discussing with bedside RN today, no direct physical exam today to conserve PPE, patient seen, resting in bed, awake, gestures, aphasic from previous stroke.    Vital Signs: BP 111/61   Pulse 88   Temp 98.7 F (37.1 C) (Axillary)   Resp (!) 26   Ht 5\' 8"  (1.727 m)   Wt 58.6 kg   SpO2 98%   BMI  19.63 kg/m  SpO2: SpO2: 98 % O2 Device: O2 Device: High Flow Nasal Cannula O2 Flow Rate: O2 Flow Rate (L/min): 10 L/min  Intake/output summary:   Intake/Output Summary (Last 24 hours) at 11/23/2019 1129 Last data filed at 11/23/2019 1110 Gross per 24 hour  Intake 891.53 ml  Output 1425 ml  Net -533.47 ml   LBM: Last BM Date:  (none since admission ) Baseline Weight: Weight: 68.9 kg Most recent weight: Weight: 58.6 kg       Palliative Assessment/Data:      Patient Active Problem List   Diagnosis Date Noted  . Acute hypoxemic respiratory failure due to COVID-19 ([place]) 11/26/2019  . 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 Dysphagia due to recent stroke 10/02/2019  . Urinary tract infection 10/02/2019  . Agitation 10/02/2019  . AKI (acute kidney injury) ([place]) 10/02/2019  . Prostate cancer ([place]) 09/24/2019  . 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 Collapse 09/24/2019  . Weakness 09/24/2019  . Middle cerebral artery embolism, left 09/24/2019  . Peripheral neuropathy 03/21/2018  . Hammer toes of both feet 03/21/2018  . Lightheadedness 09/20/2017  . Hypothyroidism 09/19/2017  . Heel pain, chronic, right 03/09/2016  . GERD (gastroesophageal reflux disease) 08/23/2015  . Iron deficiency anemia 08/23/2015  . Mild persistent chronic asthma without complication 62/95/2841  . CAD (coronary artery disease)   . PAF (paroxysmal atrial fibrillation) ([place])   . History of amiodarone therapy 01/23/2014  . Non-STEMI (non-ST elevated  myocardial infarction) ([place]) 01/23/2014  . 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 Pleural effusion 08/20/2013  . Syncope and collapse 08/20/2013  . Multiple fractures of ribs of left side 08/18/2013  . Chronic sinusitis 08/18/2013  . Enlarged prostate   . 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 Chronic systolic heart failure ([place]) 04/10/2011  . Hyperlipidemia 12/09/2010  . Ischemic cardiomyopathy 11/08/2010  . Diabetes mellitus ([place]) 11/08/2010  . Coronary artery disease 11/07/2010  . Extrinsic asthma 01/17/2010  . Essential hypertension 03/20/2007  . PULMONARY NODULE, RIGHT MIDDLE LOBE 03/20/2007    Palliative Care Assessment & Plan   Patient Profile:    Assessment: Acute hypoxic respiratory failure due to COVID-19 pneumonia Chronic diastolic congestive heart failure History of stroke and resultant aphasia History of prostate cancer Past medical history of hypertension diabetes and dyslipidemia PPS 30%  Recommendations/Plan:  Patient remains on all of the necessary COVID-19 treatment regimen.  Additionally also on cardiac medications including diuretics.  Augment PRN IV Morphine, patient has PO opioids available as well  Monitor hospital course and overall disease trajectory of illness to continue goals of care conversations with Gerald Rosario. [facility] [place] [facility] on discharge, recommend palliative follow up over there, if patient has high symptom burden or acute decline, then would warrant hospice discussions with [facility]CPOA, continue current mode of care for now.      Code Status:    Code Status Orders  (From admission, onward)         Start     Ordered   11/19/19 0330  Do not attempt resuscitation (DNR)  Continuous       Question Answer Comment  In the event of cardiac or respiratory ARREST Do not call a "code blue"   In the event of cardiac or respiratory ARREST Do not perform  Intubation, CPR, defibrillation or ACLS  In the event of cardiac or respiratory ARREST Use medication by any route, position, wound care, and other measures to relive pain and suffering. May use oxygen, suction and manual treatment of airway obstruction as needed for comfort.      11/19/19 0330        Code Status History    Date Active Date Inactive Code Status Order ID Comments User Context   11/23/2019 1812 11/19/2019 0330 DNR [id number]  Lennice Sites, DO ED   10/01/2019 1710 10/07/2019 2016 DNR [id number]  Rosalin Hawking, MD Inpatient   09/24/2019 1502 09/25/2019 1555 Full Code [id number]  Luanne Bras, MD Inpatient   09/24/2019 1118 09/24/2019 1502 Full Code [id number]  Marliss Coots, PA-C ED   01/26/2014 1509 01/27/2014 1513 Full Code [id number]  [name], MD Inpatient   01/23/2014 1525 01/26/2014 1509 Full Code [id number]  Barrett, Evelene Croon, PA-C Inpatient   08/28/2013 1715 09/08/2013 1855 Full Code [id number]  Rexene Alberts, MD Inpatient   08/26/2013 1800 08/28/2013 1715 Full Code [id number]  [name], MD Inpatient   08/20/2013 1714 08/26/2013 1800 Full Code [id number]  Evans Lance, MD Inpatient   08/18/2013 1258 08/20/2013 1714 Full Code [id number]  Charlann Lange, MD ED   12/26/2011 1144 12/27/2011 1329 Full Code [id number]  Arletha Pili, RN Inpatient   Advance Care Planning Activity       Prognosis:   Unable to determine  Discharge Planning:  To Be Determined  Care plan was discussed with patient's bedside RN.    Thank you for allowing the Palliative Medicine Team to assist in the care of this patient.   Time In:  11 Time Out:  11.15 Total Time 15 Prolonged Time Billed  no       Greater than 50%  of this time was spent counseling and coordinating care related to the above assessment and plan.  Loistine Chance, MD  Please contact Palliative Medicine Team phone at [phone] for questions and concerns.

## 2019-11-23 NOTE — Progress Notes (Signed)
ANTICOAGULATION CONSULT NOTE - Initial Consult  Pharmacy Consult for Lovenox Indication: atrial fibrillation  Allergies  Allergen Reactions  . Sulfa Antibiotics Swelling and Other (See Comments)    Possible angioedema, per the patient's daughter (ICU RN)    Patient Measurements: Height: 5\' 8"  (172.7 cm) Weight: 58.6 kg (129 lb 1.6 oz) IBW/kg (Calculated) : 68.4  Vital Signs: Temp: 102.1 F (38.9 C) (08/22 2124) Temp Source: Rectal (08/22 2124) BP: 107/61 (08/22 2100) Pulse Rate: 108 (08/22 2100)  Labs: Recent Labs    11/21/19 0415 11/21/19 0415 11/22/19 0356 11/22/19 1029 11/22/19 1247 11/23/19 0518  HGB 9.2*   < > 9.2*  --   --  10.4*  HCT 28.3*  --  28.6*  --   --  32.4*  PLT 383  --  333  --   --  435*  CREATININE 1.03  --  1.19  --   --  1.22  TROPONINIHS  --   --   --  12 13  --    < > = values in this interval not displayed.    Estimated Creatinine Clearance: 43.4 mL/min (by C-G formula based on SCr of 1.22 mg/dL).   Medical History: Past Medical History:  Diagnosis Date  . Asthma    start dulera 100 April 12,2011 > better but "knot in throat" so try qvar June 7,2011 > preferred dulera. HFA 90% May 10,2011 > 90% October 17,2011. PFT's June 7,2011 wnl x minimal nonspecific mid flow reduction while on dulera. Changed to advair intermediate strength October 17,2011 due to ins issue  . CAD (coronary artery disease)    a. severe multivessel CAD s/p STEMI (2012) with severe ICM (EF 20-25%) now improved to 55-60%    b. 08/2013 s/p CABG 3 with LIMA-LAD, SVG-RI, SVG-D1  c. 01/2014 NSTEMI  s/p DES to SVG-RI  . Chronic kidney disease   . Chronic systolic heart failure ([place])   . Diabetes mellitus   . Dyspnea   . Enlarged prostate   . Enlarged prostate   . H/O hyperkalemia   . Hearing loss   . HLD (hyperlipidemia)   . Hoarseness 12-20-11   onset 11/09. neg w/u 09/2008. saw Dr. Raelene Bott. 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 Hx of detached retina repair    a.  @ [facility]; Right Eye  . Hyperlipidemia   . Hypertension   . HYPERTENSION   . Ischemic cardiomyopathy    Repeat Cardiac MRI - EF 52%, distal Septal & apical Akinesis (suggest scar), unable to assess viability due to patient movement  . Multiple fractures of ribs of left side   . PAF (paroxysmal atrial fibrillation) ([place])    a. post CABG  . Prostate cancer ([place]) 09/24/2019  . Pulmonary nodule   . PULMONARY NODULE, RIGHT MIDDLE LOBE   . 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 and clipping of LA appendage    a. LIMA-LAD, SVG-RI, SVG-D1  . Syncope   . Syncope and collapse   . Vocal cord dysfunction     Medications:  Eliquis 5mg  PO BID- LD 8/22 @ 0824  Assessment: 74 yo M on Eliquis for Afib.  Currently able to take PO.  Pharmacy consulted to dose Lovenox. CBC- Hg 10.4, pltc WN.  No bleeding noted.   Goal of Therapy:  Anti-Xa level 0.6-1 units/ml 4hrs after LMWH dose given Monitor platelets by anticoagulation protocol: Yes   Plan:  Lovenox 60mg  SQ q12h CBC q3 days F/U  ability to resume [place] PharmD, BCPS 11/23/2019,10:00 PM

## 2019-11-23 NOTE — Plan of Care (Signed)
Patient with very minimal po intake on 7 a to 7 p shift, refused to eat any solid food, drank 1/2 of an Ensure, 1/2 carton of milk and one 4 oz apple juice.  Miralax given as patient has no documented BM since admission.  HR has maintained in the low 100-110's with oral Metoprolol.  O2 sats mid to high 90's on 10 liters HFNC. Morphine administered x 2 this shift for shortness of breath.

## 2019-11-23 NOTE — Progress Notes (Signed)
   11/23/19 1756  Assess: MEWS Score  Temp 100.1 F (37.8 C)  Pulse Rate (!) 115  ECG Heart Rate (!) 128  Resp (!) 22  SpO2 93 %  O2 Device HFNC  O2 Flow Rate (L/min) 10 L/min  Assess: MEWS Score  MEWS Temp 0  MEWS Systolic 0  MEWS Pulse 2  MEWS RR 1  MEWS LOC 0  MEWS Score 3  MEWS Score Color Yellow  Assess: if the MEWS score is Yellow or Red  Were vital signs taken at a resting state? Yes  Focused Assessment Change from prior assessment (see assessment flowsheet)  Early Detection of Sepsis Score *See Row Information* Medium  MEWS guidelines implemented *See Row Information* Yes  Treat  MEWS Interventions Administered prn meds/treatments  Pain Scale Faces  Faces Pain Scale 0  Take Vital Signs  Increase Vital Sign Frequency  Yellow: Q 2hr X 2 then Q 4hr X 2, if remains yellow, continue Q 4hrs  Escalate  MEWS: Escalate Yellow: discuss with charge nurse/RN and consider discussing with provider and RRT  Notify: Charge Nurse/RN  Name of Charge Nurse/RN Notified D. Dark, RN  Date Charge Nurse/RN Notified 11/23/19  Time Charge Nurse/RN Notified 3009  Notify: Provider  Provider Name/Title grunz  Date Provider Notified 11/23/19  Time Provider Notified 1756  Notification Type Page  Notification Reason Change in status  Response No new orders  Date of Provider Response 11/23/19  Time of Provider Response 1800

## 2019-11-23 NOTE — Progress Notes (Signed)
PT HR 95 to 115, patient indicating he is in pain, morphine given per [facility] for pain. Will revisit HR 10 minutes post morphine administration to discuss with MD..

## 2019-11-24 DIAGNOSIS — Z9189 Other specified personal risk factors, not elsewhere classified: Secondary | ICD-10-CM

## 2019-11-24 DIAGNOSIS — Z7189 Other specified counseling: Secondary | ICD-10-CM

## 2019-11-24 DIAGNOSIS — Z515 Encounter for palliative care: Secondary | ICD-10-CM

## 2019-11-24 LAB — CBC WITH DIFFERENTIAL/PLATELET
Abs Immature Granulocytes: 0 10*3/uL (ref 0.00–0.07)
Band Neutrophils: 22 %
Basophils Absolute: 0 10*3/uL (ref 0.0–0.1)
Basophils Relative: 0 %
Eosinophils Absolute: 0 10*3/uL (ref 0.0–0.5)
Eosinophils Relative: 0 %
HCT: 30.8 % — ABNORMAL LOW (ref 39.0–52.0)
Hemoglobin: 9.8 g/dL — ABNORMAL LOW (ref 13.0–17.0)
Lymphocytes Relative: 1 %
Lymphs Abs: 0.2 10*3/uL — ABNORMAL LOW (ref 0.7–4.0)
MCH: 28.4 pg (ref 26.0–34.0)
MCHC: 31.8 g/dL (ref 30.0–36.0)
MCV: 89.3 fL (ref 80.0–100.0)
Monocytes Absolute: 1.1 10*3/uL — ABNORMAL HIGH (ref 0.1–1.0)
Monocytes Relative: 6 %
Neutro Abs: 16.7 10*3/uL — ABNORMAL HIGH (ref 1.7–7.7)
Neutrophils Relative %: 71 %
Platelets: 309 10*3/uL (ref 150–400)
RBC: 3.45 MIL/uL — ABNORMAL LOW (ref 4.22–5.81)
RDW: 14.6 % (ref 11.5–15.5)
WBC Morphology: INCREASED
WBC: 18 10*3/uL — ABNORMAL HIGH (ref 4.0–10.5)
nRBC: 0 % (ref 0.0–0.2)

## 2019-11-24 LAB — DIC (DISSEMINATED INTRAVASCULAR COAGULATION)PANEL
D-Dimer, Quant: 2.5 ug/mL-FEU — ABNORMAL HIGH (ref 0.00–0.50)
Fibrinogen: 630 mg/dL — ABNORMAL HIGH (ref 210–475)
INR: 2.6 — ABNORMAL HIGH (ref 0.8–1.2)
Platelets: 308 10*3/uL (ref 150–400)
Prothrombin Time: 27.3 seconds — ABNORMAL HIGH (ref 11.4–15.2)
aPTT: 39 seconds — ABNORMAL HIGH (ref 24–36)

## 2019-11-24 LAB — LACTIC ACID, PLASMA
Lactic Acid, Venous: 2.3 mmol/L (ref 0.5–1.9)
Lactic Acid, Venous: 2.5 mmol/L (ref 0.5–1.9)

## 2019-11-24 LAB — COMPREHENSIVE METABOLIC PANEL
ALT: 16 U/L (ref 0–44)
AST: 31 U/L (ref 15–41)
Albumin: 2 g/dL — ABNORMAL LOW (ref 3.5–5.0)
Alkaline Phosphatase: 59 U/L (ref 38–126)
Anion gap: 9 (ref 5–15)
BUN: 51 mg/dL — ABNORMAL HIGH (ref 8–23)
CO2: 22 mmol/L (ref 22–32)
Calcium: 8.8 mg/dL — ABNORMAL LOW (ref 8.9–10.3)
Chloride: 109 mmol/L (ref 98–111)
Creatinine, Ser: 1.2 mg/dL (ref 0.61–1.24)
GFR calc Af Amer: >60 mL/min (ref >60)
GFR calc non Af Amer: 59 mL/min — ABNORMAL LOW (ref >60)
Glucose, Bld: 155 mg/dL — ABNORMAL HIGH (ref 70–99)
Potassium: 4.4 mmol/L (ref 3.5–5.1)
Sodium: 140 mmol/L (ref 135–145)
Total Bilirubin: 1.4 mg/dL — ABNORMAL HIGH (ref 0.3–1.2)
Total Protein: 5.6 g/dL — ABNORMAL LOW (ref 6.5–8.1)

## 2019-11-24 LAB — GLUCOSE, CAPILLARY
Glucose-Capillary: 220 mg/dL — ABNORMAL HIGH (ref 70–99)
Glucose-Capillary: 191 mg/dL — ABNORMAL HIGH (ref 70–99)
Glucose-Capillary: 141 mg/dL — ABNORMAL HIGH (ref 70–99)

## 2019-11-24 LAB — PROCALCITONIN: Procalcitonin: 2.27 ng/mL

## 2019-11-24 LAB — DIRECT ANTIGLOBULIN TEST (NOT AT ARMC)
DAT, IgG: NEGATIVE
DAT, complement: NEGATIVE

## 2019-11-24 LAB — C-REACTIVE PROTEIN: CRP: 21.5 mg/dL — ABNORMAL HIGH (ref <1.0)

## 2019-11-24 LAB — LACTATE DEHYDROGENASE: LDH: 285 U/L — ABNORMAL HIGH (ref 98–192)

## 2019-11-24 MED ORDER — MORPHINE 100MG IN NS 100ML (1MG/ML) PREMIX INFUSION
1.0000 mg/h | INTRAVENOUS | Status: DC
  Administered 2019-11-24: 1 mg/h via INTRAVENOUS
  Filled 2019-11-24 (×2): qty 100

## 2019-11-24 MED ORDER — METOPROLOL TARTRATE 5 MG/5ML IV SOLN
5.0000 mg | Freq: Once | INTRAVENOUS | Status: AC
  Administered 2019-11-24: 5 mg via INTRAVENOUS
  Filled 2019-11-24: qty 5

## 2019-11-24 MED ORDER — BIOTENE DRY MOUTH MT LIQD: 15.0000 mL | OROMUCOSAL | Status: DC | PRN

## 2019-11-24 MED ORDER — GLYCOPYRROLATE 1 MG PO TABS
1.0000 mg | ORAL_TABLET | ORAL | Status: DC | PRN
  Filled 2019-11-24: qty 1

## 2019-11-24 MED ORDER — ACETAMINOPHEN 10 MG/ML IV SOLN: 1000.0000 mg | Freq: Four times a day (QID) | INTRAVENOUS | Status: DC

## 2019-11-24 MED ORDER — LACTATED RINGERS IV BOLUS
500.0000 mL | Freq: Once | INTRAVENOUS | Status: AC
  Administered 2019-11-24: 500 mL via INTRAVENOUS

## 2019-11-24 MED ORDER — GLYCOPYRROLATE 0.2 MG/ML IJ SOLN
0.2000 mg | INTRAMUSCULAR | Status: DC | PRN
  Administered 2019-11-25 (×2): 0.2 mg via SUBCUTANEOUS
  Filled 2019-11-24 (×2): qty 1

## 2019-11-24 MED ORDER — GLYCOPYRROLATE 0.2 MG/ML IJ SOLN
0.2000 mg | INTRAMUSCULAR | Status: DC | PRN
  Administered 2019-11-24: 0.2 mg via INTRAVENOUS
  Filled 2019-11-24: qty 1

## 2019-11-24 MED ORDER — METOPROLOL TARTRATE 5 MG/5ML IV SOLN
5.0000 mg | Freq: Four times a day (QID) | INTRAVENOUS | Status: DC
  Administered 2019-11-24 (×2): 5 mg via INTRAVENOUS
  Filled 2019-11-24 (×2): qty 5

## 2019-11-24 MED ORDER — POLYVINYL ALCOHOL 1.4 % OP SOLN
1.0000 [drp] | Freq: Four times a day (QID) | OPHTHALMIC | Status: DC | PRN
  Administered 2019-11-25 (×2): 1 [drp] via OPHTHALMIC
  Filled 2019-11-24: qty 15

## 2019-11-24 MED ORDER — ACETAMINOPHEN 10 MG/ML IV SOLN
1000.0000 mg | Freq: Four times a day (QID) | INTRAVENOUS | Status: AC
  Administered 2019-11-25 (×4): 1000 mg via INTRAVENOUS
  Filled 2019-11-24 (×4): qty 100

## 2019-11-24 MED ORDER — MORPHINE BOLUS VIA INFUSION
1.0000 mg | INTRAVENOUS | Status: DC | PRN
  Administered 2019-11-25 (×3): 1 mg via INTRAVENOUS
  Filled 2019-11-24: qty 1

## 2019-11-24 NOTE — Progress Notes (Signed)
HR elevated 120-130's medicated with Metoprolol as ordered, will cont to monitor HR, BP 144/77 122. Pt has increase rhonci, suctioned thick tan secretions noted.  Pt awake, lethargic resp 27. SRP, RN

## 2019-11-24 NOTE — Progress Notes (Signed)
CRITICAL VALUE ALERT  Critical Value:  Lactic Acid 2.3  Date & Time Notied: 11/24/19  0920  Provider Notified: Dr. Bonner Puna (via text)  Tyjay Galindo, Laurel Dimmer, RN

## 2019-11-24 NOTE — Progress Notes (Signed)
Daily Progress Note   Patient Name: Gerald Rosario       Date: 11/24/2019 DOB: [birth year]  Age: 74 y.o. MRN#: [id number] Attending Physician: Patrecia Pour, MD Primary Care Physician: Binnie Rail, MD Admit Date: 12/01/2019  Reason for Consultation/Follow-up: Establishing goals of care  Subjective: Events over the course of the past 24 hours noted, now with hemodynamic instability, less alert, PO intake minimal.   Length of Stay: 6  Current Medications: Scheduled Meds:  . Chlorhexidine Gluconate Cloth  6 each Topical Daily  . enoxaparin (LOVENOX) injection  60 mg Subcutaneous BID  . finasteride  5 mg Oral Daily  . fluticasone furoate-vilanterol  1 puff Inhalation Daily  . insulin aspart  0-9 Units Subcutaneous TID WC  . insulin detemir  10 Units Subcutaneous Daily  . Ipratropium-Albuterol  1 puff Inhalation Q6H  . levothyroxine  75 mcg Oral QAC breakfast  . linagliptin  5 mg Oral Daily  . methylPREDNISolone (SOLU-MEDROL) injection  40 mg Intravenous Q12H  . metoprolol tartrate  5 mg Intravenous Q6H  . mupirocin ointment  1 application Nasal BID  . sodium chloride flush  3 mL Intravenous Q12H  . tamsulosin  0.4 mg Oral Daily    Continuous Infusions: . acetaminophen 1,000 mg (11/24/19 0506)  . ampicillin-sulbactam (UNASYN) IV 3 g (11/24/19 1007)  . dextrose 5 % and 0.45% NaCl 75 mL/hr at 11/23/19 1534  . lactated ringers      PRN Meds: acetaminophen, Ipratropium-Albuterol, morphine injection, ondansetron **OR** ondansetron (ZOFRAN) IV, oxyCODONE, polyethylene glycol, traZODone  Physical Exam         The above conversation was completed by discussing with TR[facility] MD Dr Bonner Puna   today, no direct physical exam today to conserve PPE, patient noted to be more lethargic, no longer able  to say 50-50 any more.   Vital Signs: BP 115/67   Pulse (!) 116   Temp 97.6 F (36.4 C) (Oral)   Resp (!) 21   Ht 5\' 8"  (1.727 m)   Wt 58.6 kg   SpO2 96%   BMI 19.63 kg/m  SpO2: SpO2: 96 % O2 Device: O2 Device: High Flow Nasal Cannula O2 Flow Rate: O2 Flow Rate (L/min): 10 L/min  Intake/output summary:   Intake/Output Summary (Last 24 hours) at 11/24/2019 1119 Last data filed at  11/24/2019 0900 Gross per 24 hour  Intake 1276.19 ml  Output --  Net 1276.19 ml   LBM: Last BM Date: 11/23/19 Baseline Weight: Weight: 68.9 kg Most recent weight: Weight: 58.6 kg       Palliative Assessment/Data:      Patient Active Problem List   Diagnosis Date Noted  . Acute hypoxemic respiratory failure due to COVID-19 ([place]) 11/09/2019  . 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 Dysphagia due to recent stroke 10/02/2019  . Urinary tract infection 10/02/2019  . Agitation 10/02/2019  . AKI (acute kidney injury) ([place]) 10/02/2019  . Prostate cancer ([place]) 09/24/2019  . 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 Collapse 09/24/2019  . Weakness 09/24/2019  . Middle cerebral artery embolism, left 09/24/2019  . Peripheral neuropathy 03/21/2018  . Hammer toes of both feet 03/21/2018  . Lightheadedness 09/20/2017  . Hypothyroidism 09/19/2017  . Heel pain, chronic, right 03/09/2016  . GERD (gastroesophageal reflux disease) 08/23/2015  . Iron deficiency anemia 08/23/2015  . Mild persistent chronic asthma without complication 75/64/3329  . CAD (coronary artery disease)   . PAF (paroxysmal atrial fibrillation) ([place])   . History of amiodarone therapy 01/23/2014  . Non-STEMI (non-ST elevated myocardial infarction) ([place]) 01/23/2014  . 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 Pleural effusion 08/20/2013  . Syncope and collapse 08/20/2013  . Multiple fractures of ribs of  left side 08/18/2013  . Chronic sinusitis 08/18/2013  . Enlarged prostate   . 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 Chronic systolic heart failure ([place]) 04/10/2011  . Hyperlipidemia 12/09/2010  . Ischemic cardiomyopathy 11/08/2010  . Diabetes mellitus ([place]) 11/08/2010  . Coronary artery disease 11/07/2010  . Extrinsic asthma 01/17/2010  . Essential hypertension 03/20/2007  . PULMONARY NODULE, RIGHT MIDDLE LOBE 03/20/2007    Palliative Care Assessment & Plan   Patient Profile:    Assessment: Acute hypoxic respiratory failure due to COVID-19 pneumonia Chronic diastolic congestive heart failure History of stroke and resultant aphasia History of prostate cancer Past medical history of hypertension diabetes and dyslipidemia PPS 30%  Recommendations/Plan:  Patient remains on all of the necessary COVID-19 treatment regimen, completed Remdesivir.  Additionally also on cardiac medications including diuretics.  Totally recommend continuing PRN IV Morphine, patient has PO opioids available as well.   Plan is to shift to aggressive symptom management, liberalize use of opioids or comfort measures if patient has ongoing decline, agree completely, discussed with [facility] MD.    Code Status:    Code Status Orders  (From admission, onward)         Start     Ordered   11/19/19 0330  Do not attempt resuscitation (DNR)  Continuous       Question Answer Comment  In the event of cardiac or respiratory ARREST Do not call a "code blue"   In the event of cardiac or respiratory ARREST Do not perform Intubation, CPR, defibrillation or ACLS   In the event of cardiac or respiratory ARREST Use medication by any route, position, wound care, and other measures to relive pain and suffering. May use oxygen, suction and manual treatment of airway obstruction as needed for comfort.      11/19/19 0330        Code Status History    Date Active Date Inactive Code Status Order ID Comments User  Context   11/17/2019 1812 11/19/2019  0330 DNR [id number]  Lennice Sites, DO ED   10/01/2019 1710 10/07/2019 2016 DNR [id number]  Rosalin Hawking, MD Inpatient   09/24/2019 1502 09/25/2019 1555 Full Code [id number]  Luanne Bras, MD Inpatient   09/24/2019 1118 09/24/2019 1502 Full Code [id number]  Marliss Coots, PA-C ED   01/26/2014 1509 01/27/2014 1513 Full Code [id number]  [name], MD Inpatient   01/23/2014 1525 01/26/2014 1509 Full Code [id number]  Reola Mosher Inpatient   08/28/2013 1715 09/08/2013 1855 Full Code [id number]  Rexene Alberts, MD Inpatient   08/26/2013 1800 08/28/2013 1715 Full Code [id number]  [name], MD Inpatient   08/20/2013 1714 08/26/2013 1800 Full Code [id number]  Evans Lance, MD Inpatient   08/18/2013 1258 08/20/2013 1714 Full Code [id number]  Charlann Lange, MD ED   12/26/2011 1144 12/27/2011 1329 Full Code [id number]  Arletha Pili, RN Inpatient   Advance Care Planning Activity       Prognosis:   guarded  Discharge Planning:  To Be Determined  Care plan was discussed with patient's [facility] MD.    Thank you for allowing the Palliative Medicine Team to assist in the care of this patient.   Time In:  11 Time Out:  11.15 Total Time 15 Prolonged Time Billed  no       Greater than 50%  of this time was spent counseling and coordinating care related to the above assessment and plan.  Loistine Chance, MD  Please contact Palliative Medicine Team phone at [phone] for questions and concerns.

## 2019-11-24 NOTE — Progress Notes (Signed)
Called by RN regarding worsening lethargy, remains tachypneic with some accessory muscle use on my evaluation. HR reasonably controlled, BP stable, SpO2 >90% but clearly deteriorating.   Based on prior discussions with the pt's HCPOA who loves and knows the patient well, we are certain that since his likelihood of surviving this illness is essentially zero, he does/would not want to continue enduring interventions that would not change that outcome. To that end, we agree to Gerald continue using morphine for respiratory distress, and cease such close vital sign monitoring. I have spoken with RN and charge RN who Gerald reach out to [facility] to offer visitation this evening.  - Convert to full comfort measures.  - After visitation, recommend starting morphine infusion and continue bolusing from infusion if needed. Once comfortable, we can wean from oxygen and treat any respiratory distress or tachypnea, etc. with morphine.  - Robinul prn ordered, would go ahead and give dose now.  - Please call me at [phone] when Gerald Rosario, the patient's HCPOA arrives.   Vance Gather, MD 11/24/2019 6:13 PM

## 2019-11-24 NOTE — Progress Notes (Signed)
HR improved 109, sat 95%.respiration varies with movement. Will continue to monitor SRP, RN

## 2019-11-24 NOTE — Progress Notes (Signed)
HR continue to be irregular and irradiac at times---with some improvement HR 110-115 to 120's non-sustaining. 3 rd dose of Metoprolol given as ordered. Pt open eyes to name and tactile touch. Will cont to monitor. SRP, RN

## 2019-11-24 NOTE — Progress Notes (Signed)
Pt MEWS RED, unchanged for this am, have been RED to YELLOW throughout the day. Frequent VS checked, LOC ---lethargic, open yes to name.. will update MD. SRP, RN

## 2019-11-24 NOTE — Progress Notes (Signed)
Pt HCPOA arrived to visit with pt per END of LIFE protocol. Pt open eyes when family friend arrived [place]. Friend prayed with pt and spoke with MD to receive update. Pt FULL COMFORT care per MD order. SRP, RN

## 2019-11-24 NOTE — Progress Notes (Signed)
Pt restless, throwing covers back and kicking legs softly, HR 115 to 120's, O2 sat remain WNL, pt congested upper airway, suctioned throughout the shift, thick light tan secretions noted. Rhonci and rales noted, medicated with Morphine. Will cont to monitor. SRP, RN

## 2019-11-24 NOTE — Progress Notes (Signed)
PT Cancellation Note  Patient Details Name: Gerald Rosario MRN: [id number] DOB: [birth year]   Cancelled Treatment:    Reason Eval/Treat Not Completed: Medical issues which prohibited therapy, has had AMS, Increased HR and increased secretions.   Tresa Endo [place] Pager [phone] Office [phone]  11/24/2019, 1:35 PM

## 2019-11-24 NOTE — Progress Notes (Signed)
HR elevated to 130's irregular, MD made aware.

## 2019-11-24 NOTE — Progress Notes (Signed)
Patient's temperature is now 97.9 oral, and patient is now able to cooperate with care, as evidenced by taking Combivent inhaler as scheduled, patient is less lethargic but still not truly interacting with staff other than to follow basic instructions with coaxing. Patient has not been able to expectorate any sputum, and has audible rhonchi to auscultation. Removed ice packs to prevent hypothermic episode at this point.

## 2019-11-24 NOTE — Progress Notes (Signed)
PROGRESS NOTE  Gerald Rosario  QAS:[id number] DOB: [birth year] DOA: 11/16/2019 PCP: Binnie Rail, MD  Outpatient Specialists: [facility] Brief Narrative: Gerald Rosario is a 74 y.o. male with history of prostate cancer, CABG x3, paroxysmal A. fib, ischemic cardiomyopathy, hypertension, hyperlipidemia's, diabetes, who presents from skilled nursing facility with worsening hypoxia in the setting of known Covid infection.  Patient recently admitted to [facility] from June 23 to July 6 for acute stroke due to occlusion of left ICA and MCA.  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  He was discharged to a SNF ([facility]), he was made DNR prior to discharge.  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  Per ED providers discussion with power of attorney patient was diagnosed with Covid approximately 2 weeks ago at his SNF, started on oxygen several days ago although the exact timing is unclear and was also treated with antibiotics.  He did not receive any Covid vaccination.  Patient began to have escalating oxygen requirements which prompted transfer to the emergency department.   Review of outside records showed that he was given 5 days of p.o. Decadron, as well as amoxicillin and azithromycin while at the nursing home.  Arrival to the ED patient was initially hypoxic to the 80s but with then placed on 6 L of oxygen with improvement in saturation.  Labs notable for positive Covid test, elevated inflammatory markers, normal procalcitonin, CBC with elevated white count of 14 and improving anemia with hemoglobin 11.7, platelets of 496, as well as elevated lactic acid of 3.2  He was given IV Decadron remdesivir in the ED and admitted for further management of his acute hypoxemic respiratory failure secondary to Covid infection.  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 Pt's po intake is nil, mentation making oral medication administration unsafe, has well-documented severe comorbidities before all of this. Goal remains to treat the treatable wit hopes of clinical recovery. During this, however, morphine is ok to be given for distress. If clinically he continues deterioration, we would institute comfort measures and allow visitation, seek hospice placement. DNR/DNI confirmed.  Acute hypoxemic respiratory failure due to covid-19 pneumonia: SARS-CoV-2 PCR confirmed positive on 8/17, also was positive 8/4 at SNF. Said to have been positive weeks before. Lymphopenic with elevated CRP 13.5 on arrival.  - Continue supplemental oxygen. Aim to keep SpO2 >90% for now to support physiologically - Completed remdesivir (8/17 - 8/21) - Continue steroids.  - Holding baricitinib given 8/19 - 8/22. - Continue full dose eliquis at this time.  - 21 day isolation recommended from positive test which is reported to have been 8/4.    Coagulopathy: Schistocytes seen on smear, plts stable, hgb relatively stable, no  signs of bleeding. INR up in setting of eliquis, fibrinogen up in setting of covid.  - DAT and DIC panel ordered, d/w Dr. Lorenso Courier who recommends these labs and supportive care at this time.   Sepsis due to aspiration pneumonia: PCT  rising precipitously, fevers, overall worsening causing AFib with RVR and hypotension responsive to IV fluids.   - Check blood cultures  - Continue unasyn - Aspiration precautions and SLP evaluation as below  Chronic diastolic CHF (grade III diastolic dysfunction on echo June 2021), ICM, paroxysmal AFib, CAD s/p CABG: BNP 558. Though previous ProBNPs were in 5000's. - Continue metoprolol, change over to IV formulation. - Holding home lasix, giving IVF. Lactic acid elevated so will give 500cc LR bolus. - On eliquis.  Hyperkalemia: Resolved.  History of CVA with persistent aphasia: Pt able to understand much of what is said to him. - ASA, statin - Downgrade to dysphagia diet with aspiration precautions. SLP evaluation requested.    Prostate CA: Dr. Alvy Bimler reached out as she was reviewing outpatient referrals. Will need to delay this at this time.  T2DM: At inpatient goal currently - Continue low dose basal levemir and and continue SSI. - Continue linagliptin.    Hypertension:  - Holding amlodipine  Hyperlipidemia:  - Hold atorvastatin  Asthma: No wheezing.  - Continue symbicort, combivent  BPH:  - Continue finasteride and tamsulosin  Hypothyroidism:  - Continue synthroid  GERD:  - Continue PPI  DVT prophylaxis: Eliquis Code Status: DNR, POA Family Communication: HCPOA by phone this AM.  Disposition Plan:  Status is: Inpatient  Remains inpatient appropriate because:Inpatient level of care appropriate due to severity of illness  Dispo: The patient is from: SNF Per power of attorney he is very unhappy with care at current nursing home, consult transitions of care for dispo planning to alternative facility              Anticipated d/c is to: SNF              Anticipated d/c date is: > 3 days pending clinical trajectory. Continues to have guarded prognosis.              Patient currently is not medically stable to d/c.  Consultants:   Heme/onc, Dr. Lorenso Courier by phone  11/24/2019.  Procedures:   None  Antimicrobials:  Remdesivir 8/17 - 8/21  Unasyn 8/22 >>  Subjective: Fever overnight, more lethargic intermittently and developing intermittent respiratory distress requiring morphine administration.   Objective: Vitals:   11/24/19 0600 11/24/19 0642 11/24/19 1000 11/24/19 1045  BP: 115/67     Pulse: (!) 116     Resp: (!) 21     Temp:  98.6 F (37 C) 97.7 F (36.5 C) 97.6 F (36.4 C)  TempSrc:  Oral Oral Oral  SpO2: 96%     Weight:      Height:        Intake/Output Summary (Last 24 hours) at 11/24/2019 1104 Last data filed at 11/24/2019 0900 Gross per 24 hour  Intake 1276.19 ml  Output 300 ml  Net 976.19 ml   Gen: 74 y.o. male in no distress, acutely ill-appearing Pulm: Nonlabored breathing supplemental oxygen, rhonchi diffusely. CV: Irreg irreg. No murmur, rub, or gallop. No JVD, no dependent edema. GI: Abdomen soft, non-tender, non-distended, with normoactive bowel sounds.  Ext: Warm, no deformities Skin: No new rashes, lesions or ulcers on visualized skin. Neuro: Alert but much more withdrawn, does not verbalize, does track with eyes, moves  extremities but clearly very diffusely weak. Psych: UTD  Data Reviewed: I have personally reviewed following labs and imaging studies  CBC: Recent Labs  Lab 11/20/19 0541 11/20/19 0541 11/21/19 0415 11/22/19 0356 11/23/19 0518 11/24/19 0305 11/24/19 0818  WBC 17.0*  --  28.0* 24.9* 26.1* 18.0*  --   NEUTROABS 15.7*  --  26.1* 22.7* 23.4* 16.7*  --   HGB 10.4*  --  9.2* 9.2* 10.4* 9.8*  --   HCT 31.9*  --  28.3* 28.6* 32.4* 30.8*  --   MCV 88.9  --  87.9 89.9 88.5 89.3  --   PLT 375   < > 383 333 435* 309 308   < > = values in this interval not displayed.   Basic Metabolic Panel: Recent Labs  Lab 11/19/19 0400 11/19/19 0400 11/20/19 0541 11/21/19 0415 11/22/19 0356 11/23/19 0518 11/24/19 0305  NA 138   < > 137 138 133* 137 140  K 5.4*   < > 4.5 4.9 4.8 4.4 4.4  CL 107    < > 106 104 101 104 109  CO2 23   < > 23 22 23 22 22   GLUCOSE 326*   < > 228* 144* 183* 107* 155*  BUN 33*   < > 31* 38* 52* 52* 51*  CREATININE 0.95   < > 0.85 1.03 1.19 1.22 1.20  CALCIUM 9.4   < > 9.8 9.7 8.9 9.4 8.8*  MG 2.3  --  2.1 2.0 2.2 2.2  --   PHOS 3.7  --  3.4 3.3  --   --   --    < > = values in this interval not displayed.   GFR: Estimated Creatinine Clearance: 44.1 mL/min (by C-G formula based on SCr of 1.2 mg/dL). Liver Function Tests: Recent Labs  Lab 11/20/19 0541 11/21/19 0415 11/22/19 0356 11/23/19 0518 11/24/19 0305  AST 22 20 18 23 31   ALT 23 17 15 16 16   ALKPHOS 59 56 59 75 59  BILITOT 1.2 1.0 0.9 0.8 1.4*  PROT 6.6 5.8* 5.6* 6.2* 5.6*  ALBUMIN 2.7* 2.4* 2.3* 2.4* 2.0*   No results for input(s): LIPASE, AMYLASE in the last 168 hours. No results for input(s): AMMONIA in the last 168 hours. Coagulation Profile: Recent Labs  Lab 11/24/19 0818  INR 2.6*   Cardiac Enzymes: No results for input(s): CKTOTAL, CKMB, CKMBINDEX, TROPONINI in the last 168 hours. BNP (last 3 results) No results for input(s): PROBNP in the last 8760 hours. HbA1C: No results for input(s): HGBA1C in the last 72 hours. CBG: Recent Labs  Lab 11/23/19 0724 11/23/19 1120 11/23/19 1648 11/23/19 1952 11/24/19 0824  GLUCAP 109* 157* 97 106* 220*   Lipid Profile: No results for input(s): CHOL, HDL, LDLCALC, TRIG, CHOLHDL, LDLDIRECT in the last 72 hours. Thyroid Function Tests: Recent Labs    11/23/19 0518  TSH 1.091   Anemia Panel: No results for input(s): VITAMINB12, FOLATE, FERRITIN, TIBC, IRON, RETICCTPCT in the last 72 hours. Urine analysis:    Component Value Date/Time   COLORURINE YELLOW 09/28/2019 1524   APPEARANCEUR CLOUDY (A) 09/28/2019 1524   LABSPEC 1.008 09/28/2019 1524   PHURINE 6.0 09/28/2019 1524   GLUCOSEU >=500 (A) 09/28/2019 1524   HGBUR SMALL (A) 09/28/2019 [place] 09/28/2019 [place] 09/28/2019 1524    PROTEINUR NEGATIVE 09/28/2019 1524   UROBILINOGEN 1.0 09/02/2013 1838   NITRITE NEGATIVE 09/28/2019 1524   LEUKOCYTESUR LARGE (A) 09/28/2019 1524  Recent Results (from the past 240 hour(s))  Blood Culture (routine x 2)     Status: None   Collection Time: 12/01/2019  4:43 PM   Specimen: BLOOD  Result Value Ref Range Status   Specimen Description   Final    BLOOD RIGHT ANTECUBITAL Performed at [facility], [place] [address] [place], [place] [address]    Special Requests   Final    BOTTLES DRAWN AEROBIC AND ANAEROBIC Blood Culture adequate volume Performed at [place] [address], [place], [place] [address]    Culture   Final    NO GROWTH 5 DAYS Performed at [facility], [place] [address] [place], [place] [address]    Report Status 11/23/2019 FINAL  Final  SARS Coronavirus 2 by RT PCR (hospital order, performed in [facility] hospital lab) Nasopharyngeal Nasopharyngeal Swab     Status: Abnormal   Collection Time: 11/03/2019  6:26 PM   Specimen: Nasopharyngeal Swab  Result Value Ref Range Status   SARS Coronavirus 2 POSITIVE (A) NEGATIVE Final    Comment: RESULT CALLED TO, READ BACK BY AND VERIFIED WITH: BROOKS,B. RN @1924  11/16/2019 BILLINGSLEY,L (NOTE) SARS-CoV-2 target nucleic acids are DETECTED  SARS-CoV-2 RNA is generally detectable in upper respiratory specimens  during the acute phase of infection.  Positive results are indicative  of the presence of the identified virus, but do not rule out bacterial infection or co-infection with other pathogens not detected by the test.  Clinical correlation with patient history and  other diagnostic information is necessary to determine patient infection status.  The expected result is negative.  Fact Sheet for Patients:   StrictlyIdeas.no   Fact Sheet for Healthcare Providers:   BankingDealers.co.za    This test is not yet approved or cleared by the  [place] FDA and  has been authorized for detection and/or diagnosis of SARS-CoV-2 by FDA under an Emergency Use Authorization (EUA).  This EUA will remain in effect (meaning t his test can be used) for the duration of  the COVID-19 declaration under Section 564(b)(1) of the Act, 21 U.S.C. section 360-bbb-3(b)(1), unless the authorization is terminated or revoked sooner.  Performed at [facility], [place] [address] [place], [place] [address]   Blood Culture (routine x 2)     Status: None   Collection Time: 11/08/2019  6:29 PM   Specimen: BLOOD  Result Value Ref Range Status   Specimen Description   Final    BLOOD LEFT ANTECUBITAL Performed at [facility], [place] [address] [place], [place] [address]    Special Requests   Final    BOTTLES DRAWN AEROBIC AND ANAEROBIC Blood Culture adequate volume Performed at [place] [address] [place], [place] [address]    Culture   Final    NO GROWTH 5 DAYS Performed at [facility], [place] [address], [place],  [address]    Report Status 11/23/2019 FINAL  Final  MRSA PCR Screening     Status: Abnormal   Collection Time: 11/22/19  4:17 PM   Specimen: Nasal Mucosa; Nasopharyngeal  Result Value Ref Range Status   MRSA by PCR POSITIVE (A) NEGATIVE Final    Comment:        The GeneXpert MRSA Assay (FDA approved for NASAL specimens only), is one component of a comprehensive MRSA colonization surveillance program. It is not intended to diagnose MRSA infection nor to guide or monitor treatment for MRSA  infections. RESULT CALLED TO, READ BACK BY AND VERIFIED WITH: M,STEPHEN AT 2114 ON 11/22/19 BY A,MOHAMED Performed at [facility], [place] [address] [place], [place] [address]       Radiology Studies: DG CHEST PORT 1 VIEW  Result Date: 11/23/2019 CLINICAL DATA:  74 year old male with history of COVID infection. EXAM: PORTABLE CHEST 1 VIEW COMPARISON:  Chest  x-ray 11/22/2019. FINDINGS: Patchy multifocal areas of interstitial prominence and airspace disease are noted throughout the lungs bilaterally, most severe throughout the periphery of the right lung. Overall, aeration appears similar to slightly worsened compared to the prior examination. No pleural effusions. No evidence of pulmonary edema. Heart size is borderline enlarged. Upper mediastinal contours are within normal limits. Status post median sternotomy for CABG and left atrial appendage ligation. Aortic atherosclerosis. IMPRESSION: 1. Multilobar bilateral pneumonia redemonstrated compatible with reported COVID infection. Overall, aeration appears similar to slightly worsened compared to the prior examination. 2. Aortic atherosclerosis. Electronically Signed   By: Vinnie Langton M.D.   On: 11/23/2019 06:01    Scheduled Meds:  Chlorhexidine Gluconate Cloth  6 each Topical Daily   enoxaparin (LOVENOX) injection  60 mg Subcutaneous BID   finasteride  5 mg Oral Daily   fluticasone furoate-vilanterol  1 puff Inhalation Daily   insulin aspart  0-9 Units Subcutaneous TID WC   insulin detemir  10 Units Subcutaneous Daily   Ipratropium-Albuterol  1 puff Inhalation Q6H   levothyroxine  75 mcg Oral QAC breakfast   linagliptin  5 mg Oral Daily   methylPREDNISolone (SOLU-MEDROL) injection  40 mg Intravenous Q12H   metoprolol tartrate  5 mg Intravenous Q6H   mupirocin ointment  1 application Nasal BID   sodium chloride flush  3 mL Intravenous Q12H   tamsulosin  0.4 mg Oral Daily   Continuous Infusions:  acetaminophen 1,000 mg (11/24/19 0506)   ampicillin-sulbactam (UNASYN) IV 3 g (11/24/19 1007)   dextrose 5 % and 0.45% NaCl 75 mL/hr at 11/23/19 1534   lactated ringers       LOS: 6 days   Time spent: 35 minutes.  Patrecia Pour, MD [facility] [email or web address] 11/24/2019, 11:04 AM

## 2019-11-24 NOTE — Progress Notes (Signed)
Morphine drip started per order for comfort. Pt Tele d/c'd, repositioned , mouth care and suction care completed. Handoff to oncoming RN. SRP,RN

## 2019-11-24 NOTE — Progress Notes (Signed)
Pt HR improved after Metoprolol 5 mg administered--103-110's, BP 113/95. Pt O2 sat on 10 liters 90-93%; will cont to monitor closely. SRP,RN

## 2019-11-24 NOTE — Plan of Care (Signed)
  Problem: Nutrition: Goal: Adequate nutrition will be maintained Outcome: Not Progressing   

## 2019-11-25 DIAGNOSIS — U071 COVID-19: Principal | ICD-10-CM

## 2019-11-25 LAB — BLOOD CULTURE ID PANEL (REFLEXED) - BCID2

## 2019-11-25 LAB — HAPTOGLOBIN: Haptoglobin: 197 mg/dL (ref 34–355)

## 2019-11-27 LAB — CULTURE, BLOOD (ROUTINE X 2): Special Requests: ADEQUATE

## 2019-11-29 LAB — CULTURE, BLOOD (ROUTINE X 2)
Culture: NO GROWTH
Special Requests: ADEQUATE

## 2019-12-03 NOTE — Progress Notes (Signed)
Pt given bolus and O2 decreased to 4 l nasal cannula, shallow and rapid respirations--  28 continue with periods of apnea 10-15 sec, will continue monitor. SRP, RN

## 2019-12-03 NOTE — Progress Notes (Signed)
Resting with decreased congestion. Period of apnea noted. O2 at 7 liters. Open eyes and respond to tactile touch. Appear to be comfortable without s/s of pain or distress. Morphine infusion continues as ordered. Will cont to monitor. SRP, RN

## 2019-12-03 NOTE — Progress Notes (Signed)
Spoke with Irving Burton and [facility] and both finally confirmed that patient will be cremated by [employer] in [place] and not [place] in [place].

## 2019-12-03 NOTE — Progress Notes (Signed)
Wasted 50 ml of Morphine, witness by Tye Savoy, RN. Pt is contact/airborne precaution. ALL IV removed, foley discontinued. MD made aware at 1744. HCPOA called by CN and updated and will call and update close of kin. Waiting to hear back from [facility] for final arrangements. SRP, RN

## 2019-12-03 NOTE — Death Summary Note (Signed)
DEATH SUMMARY   Patient Details  Name: Gerald Rosario MRN: [id number] DOB: 09-24-1945  Admission/Discharge Information   Admit Date:  Dec 04, 2019  Date of Death: Date of Death: (P) 12-11-19  Time of Death: Time of Death: (P) 95  Length of Stay: 7  Referring Physician: Binnie Rail, MD   Reason(s) for Hospitalization  Hypoxic respiratory failure  Diagnoses  Preliminary cause of death: Acute hypoxic respiratory failure due to aspiration pneumonia in the setting of covid-19 pneumonia Secondary Diagnoses (including complications and co-morbidities):  Active Problems:   Essential hypertension   Extrinsic asthma   Coronary artery disease   Ischemic cardiomyopathy   Diabetes mellitus (HCC)   Hyperlipidemia   Acute on chronic diastolic heart failure (HCC)   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 and clipping of LA appendage   PAF (paroxysmal atrial fibrillation) (HCC) Atrial fibrillation with rapid ventricular response   Hypothyroidism   Prostate cancer (HCC)   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 year old male with a history of prostate cancer, CABG x3, paroxysmal A. fib, ischemic cardiomyopathy, hypertension, hyperlipidemia's, diabetes, who presents from skilled nursing facility with worsening hypoxia in the setting of known Covid infection.  Patient recently admitted to [facility] from June 23 to July 6 for acute stroke due to occlusion of left ICA and MCA.  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 He was discharged to a SNF ([facility]),he was made DNR prior to discharge.  Majority of history provided by ED providerand POAas patient is aphasic secondary to stroke and only able to respond to questions by saying "50-50". Per ED providers discussion with power of attorney patient was diagnosed with Covid approximately 2 weeks ago at his SNF, started on oxygen several days ago although the exact timing is unclear and was also treated with antibiotics. He did not receive any Covid vaccination. Patient began to have escalating oxygen requirements which prompted transfer to the emergency department.  Review of outside records showed that he was given 5 days of p.o. Decadron, as well as amoxicillin and azithromycin while at the nursing home.  Arrival to the ED patient was initially hypoxic to the 80s but with then placed on 6 L of oxygen with improvement in saturation. Labs notable for positive Covid test, elevated inflammatory markers, normal procalcitonin, CBC with elevated white count of 14 and improving anemia with hemoglobin 11.7, platelets of 496, as well as elevated lactic acid of 3.2  He was given IV Decadron remdesivir in the ED and admitted for further management of his acute hypoxemic respiratory failure secondary to Covid infection. Unfortunately on 8/23 after responding to diuresis for acute on chronic HFpEF and standard treatment for covid-19 pneumonia, he had lethargy, atrial fibrillation with RVR, hypotension, lactic acidosis, developing coagulopathy, felt to represent severe sepsis. Antibiotics added, blood cultures drawn which would later reveal Pseudomonas bacteremia, and the patient was transferred to progressive care unit. Vital sign instability continued despite volume resuscitation, broad antimicrobial  coverage, and supportive care. With ongoing discussions with his HCPOA, it was  confirmed that the patient's wishes in this situation would be to transition to comfort measures. For continued respiratory distress, morphine infusion was started. Visitation was offered, and the patient subsequently died on 12/04/2019.    Pertinent Labs and Studies  Significant Diagnostic Studies DG CHEST PORT 1 VIEW  Result Date: 11/23/2019 CLINICAL DATA:  74 year old male with history of COVID infection. EXAM: PORTABLE CHEST 1 VIEW COMPARISON:  Chest x-ray 11/22/2019. FINDINGS: Patchy multifocal areas of interstitial prominence and airspace disease are noted throughout the lungs bilaterally, most severe throughout the periphery of the right lung. Overall, aeration appears similar to slightly worsened compared to the prior examination. No pleural effusions. No evidence of pulmonary edema. Heart size is borderline enlarged. Upper mediastinal contours are within normal limits. Status post median sternotomy for CABG and left atrial appendage ligation. Aortic atherosclerosis. IMPRESSION: 1. Multilobar bilateral pneumonia redemonstrated compatible with reported COVID infection. Overall, aeration appears similar to slightly worsened compared to the prior examination. 2. Aortic atherosclerosis. Electronically Signed   By: Vinnie Langton M.D.   On: 11/23/2019 06:01   DG CHEST PORT 1 VIEW  Result Date: 11/22/2019 CLINICAL DATA:  Respiratory disease due to COVID-19. EXAM: PORTABLE CHEST 1 VIEW COMPARISON:  November 20, 2019 FINDINGS: Right greater than left pulmonary infiltrates remain, similar in the interval. Stable cardiomediastinal silhouette. No pneumothorax. No other change. IMPRESSION: Right greater than left pulmonary infiltrates remain, similar in the interval. Electronically Signed   By: Dorise Bullion III M.D   On: 11/22/2019 10:23   DG CHEST PORT 1 VIEW  Result Date: 11/20/2019 CLINICAL DATA:  COVID positive. EXAM: PORTABLE CHEST 1 VIEW COMPARISON:  11/29/2019. FINDINGS: Prior CABG.  Cardiomegaly. Bilateral pulmonary infiltrates/edema again noted. Slight improvement in aeration from prior exam. No prominent pleural effusion. No pneumothorax. IMPRESSION: 1.  Prior CABG.  Cardiomegaly. 2. Bilateral pulmonary infiltrates/edema again noted. Slight improvement in aeration from prior exam. Electronically Signed   By: Marcello Moores  Register   On: 11/20/2019 09:34   DG Chest Port 1 View  Result Date: 11/24/2019 CLINICAL DATA:  Hypoxia, COVID-19 positive 2 weeks ago EXAM: PORTABLE CHEST 1 VIEW COMPARISON:  01/23/2014 FINDINGS: Single frontal view of the chest demonstrates postsurgical changes from prior CABG. Cardiac silhouette is unremarkable. There is multifocal bilateral interstitial and ground-glass opacity, with relative sparing of the left apex. No effusion or pneumothorax. No acute bony abnormalities. IMPRESSION: 1. Findings most consistent with multifocal COVID-19 pneumonia. Electronically Signed   By: Randa Ngo M.D.   On: 12/02/2019 19:44    Microbiology Recent Results (from the past 240 hour(s))  Blood Culture (routine x 2)     Status: None   Collection Time: 11/14/2019  4:43 PM   Specimen: BLOOD  Result Value Ref Range Status   Specimen Description   Final    BLOOD RIGHT ANTECUBITAL Performed at [facility], [address], [place], [place] [address]    Special Requests   Final    BOTTLES DRAWN AEROBIC AND ANAEROBIC Blood Culture adequate volume Performed at [place] [address], [place], [place] [address]    Culture   Final    NO GROWTH 5 DAYS Performed at [facility], [place] [address], [place], [place] [address]    Report Status 11/23/2019 FINAL  Final  SARS Coronavirus 2 by RT PCR (hospital order, performed in [facility] hospital lab) Nasopharyngeal Nasopharyngeal Swab  Status: Abnormal   Collection Time: 11/23/2019  6:26 PM   Specimen: Nasopharyngeal Swab  Result Value Ref Range Status   SARS Coronavirus 2 POSITIVE (A)  NEGATIVE Final    Comment: RESULT CALLED TO, READ BACK BY AND VERIFIED WITH: BROOKS,B. RN @1924  12/02/2019 BILLINGSLEY,L (NOTE) SARS-CoV-2 target nucleic acids are DETECTED  SARS-CoV-2 RNA is generally detectable in upper respiratory specimens  during the acute phase of infection.  Positive results are indicative  of the presence of the identified virus, but do not rule out bacterial infection or co-infection with other pathogens not detected by the test.  Clinical correlation with patient history and  other diagnostic information is necessary to determine patient infection status.  The expected result is negative.  Fact Sheet for Patients:   StrictlyIdeas.no   Fact Sheet for Healthcare Providers:   BankingDealers.co.za    This test is not yet approved or cleared by the [place] FDA and  has been authorized for detection and/or diagnosis of SARS-CoV-2 by FDA under an Emergency Use Authorization (EUA).  This EUA will remain in effect (meaning t his test can be used) for the duration of  the COVID-19 declaration under Section 564(b)(1) of the Act, 21 U.S.C. section 360-bbb-3(b)(1), unless the authorization is terminated or revoked sooner.  Performed at [facility], [place] [address], [place], [place] [address]   Blood Culture (routine x 2)     Status: None   Collection Time: 11/29/2019  6:29 PM   Specimen: BLOOD  Result Value Ref Range Status   Specimen Description   Final    BLOOD LEFT ANTECUBITAL Performed at [facility], [place] [address] [place], [place] [address]    Special Requests   Final    BOTTLES DRAWN AEROBIC AND ANAEROBIC Blood Culture adequate volume Performed at [place] [address], [place], [place] [address]    Culture   Final    NO GROWTH 5 DAYS Performed at [facility], [place] [address], [place], [place] [address]    Report Status 11/23/2019 FINAL  Final   MRSA PCR Screening     Status: Abnormal   Collection Time: 11/22/19  4:17 PM   Specimen: Nasal Mucosa; Nasopharyngeal  Result Value Ref Range Status   MRSA by PCR POSITIVE (A) NEGATIVE Final    Comment:        The GeneXpert MRSA Assay (FDA approved for NASAL specimens only), is one component of a comprehensive MRSA colonization surveillance program. It is not intended to diagnose MRSA infection nor to guide or monitor treatment for MRSA infections. RESULT CALLED TO, READ BACK BY AND VERIFIED WITH: M,STEPHEN AT 2114 ON 11/22/19 BY A,MOHAMED Performed at [facility], [place] [address], [place], [place] [address]   Culture, blood (routine x 2)     Status: None (Preliminary result)   Collection Time: 11/24/19 11:36 AM   Specimen: BLOOD LEFT WRIST  Result Value Ref Range Status   Specimen Description   Final    BLOOD LEFT WRIST Performed at [place] [address] [place], [place] [address]    Special Requests   Final    BOTTLES DRAWN AEROBIC AND ANAEROBIC Blood Culture adequate volume Performed at [place] [address] [place], [place] [address]    Culture   Final    NO GROWTH < 24 HOURS Performed at [place] [address], [place], [place] [address]  Report Status PENDING  Incomplete  Culture, blood (routine x 2)     Status: None (Preliminary result)   Collection Time: 11/24/19 11:36 AM   Specimen: BLOOD  Result Value Ref Range Status   Specimen Description   Final    BLOOD LEFT ANTECUBITAL Performed at [facility], [place] [address], [place], [place] [address]    Special Requests   Final    BOTTLES DRAWN AEROBIC AND ANAEROBIC Blood Culture adequate volume Performed at [place] [address], [place], [place] [address]    Culture  Setup Time   Final    GRAM NEGATIVE RODS AEROBIC BOTTLE ONLY CRITICAL RESULT CALLED TO, READ BACK BY AND VERIFIED WITH: Marlou Starks [phone] FCP Performed at [facility], [place] [address], [place], [place] [address]    Culture GRAM NEGATIVE RODS  Final   Report Status PENDING  Incomplete  Blood Culture ID Panel (Reflexed)     Status: Abnormal   Collection Time: 11/24/19 11:36 AM  Result Value Ref Range Status   Enterococcus faecalis NOT DETECTED NOT DETECTED Final   Enterococcus Faecium NOT DETECTED NOT DETECTED Final   Listeria monocytogenes NOT DETECTED NOT DETECTED Final   Staphylococcus species NOT DETECTED NOT DETECTED Final   Staphylococcus aureus (BCID) NOT DETECTED NOT DETECTED Final   Staphylococcus epidermidis NOT DETECTED NOT DETECTED Final   Staphylococcus lugdunensis NOT DETECTED NOT DETECTED Final   Streptococcus species NOT DETECTED NOT DETECTED Final   Streptococcus agalactiae NOT DETECTED NOT DETECTED Final   Streptococcus pneumoniae NOT DETECTED NOT DETECTED Final   Streptococcus pyogenes NOT DETECTED NOT DETECTED Final   A.calcoaceticus-baumannii NOT DETECTED NOT DETECTED Final   Bacteroides fragilis NOT DETECTED NOT DETECTED Final   Enterobacterales NOT DETECTED NOT DETECTED Final   Enterobacter cloacae complex NOT DETECTED NOT DETECTED Final   Escherichia coli NOT DETECTED NOT DETECTED Final   Klebsiella aerogenes NOT DETECTED NOT DETECTED Final   Klebsiella oxytoca NOT DETECTED NOT DETECTED Final   Klebsiella pneumoniae NOT DETECTED NOT DETECTED Final   Proteus species NOT DETECTED NOT DETECTED Final   Salmonella species NOT DETECTED NOT DETECTED Final   Serratia marcescens NOT DETECTED NOT DETECTED Final   Haemophilus influenzae NOT DETECTED NOT DETECTED Final   Neisseria meningitidis NOT DETECTED NOT DETECTED Final   Pseudomonas aeruginosa DETECTED (A) NOT DETECTED Final    Comment: CRITICAL RESULT CALLED TO, READ BACK BY AND VERIFIED WITH: PHARMD ELLEN J. [id number] FCP    Stenotrophomonas maltophilia NOT DETECTED NOT DETECTED Final   Candida albicans NOT DETECTED  NOT DETECTED Final   Candida auris NOT DETECTED NOT DETECTED Final   Candida glabrata NOT DETECTED NOT DETECTED Final   Candida krusei NOT DETECTED NOT DETECTED Final   Candida parapsilosis NOT DETECTED NOT DETECTED Final   Candida tropicalis NOT DETECTED NOT DETECTED Final   Cryptococcus neoformans/gattii NOT DETECTED NOT DETECTED Final   CTX-M ESBL NOT DETECTED NOT DETECTED Final   Carbapenem resistance IMP NOT DETECTED NOT DETECTED Final   Carbapenem resistance KPC NOT DETECTED NOT DETECTED Final   Carbapenem resistance NDM NOT DETECTED NOT DETECTED Final   Carbapenem resistance VIM NOT DETECTED NOT DETECTED Final    Comment: Performed at [facility], [address] [place], [place] [address]    Lab Basic Metabolic Panel: Recent Labs  Lab 11/19/19 0400 11/19/19 0400 11/20/19 0541 11/21/19 0415 11/22/19 0356 11/23/19 0518 11/24/19 0305  NA 138   < >  137 138 133* 137 140  K 5.4*   < > 4.5 4.9 4.8 4.4 4.4  CL 107   < > 106 104 101 104 109  CO2 23   < > 23 22 23 22 22   GLUCOSE 326*   < > 228* 144* 183* 107* 155*  BUN 33*   < > 31* 38* 52* 52* 51*  CREATININE 0.95   < > 0.85 1.03 1.19 1.22 1.20  CALCIUM 9.4   < > 9.8 9.7 8.9 9.4 8.8*  MG 2.3  --  2.1 2.0 2.2 2.2  --   PHOS 3.7  --  3.4 3.3  --   --   --    < > = values in this interval not displayed.   Liver Function Tests: Recent Labs  Lab 11/20/19 0541 11/21/19 0415 11/22/19 0356 11/23/19 0518 11/24/19 0305  AST 22 20 18 23 31   ALT 23 17 15 16 16   ALKPHOS 59 56 59 75 59  BILITOT 1.2 1.0 0.9 0.8 1.4*  PROT 6.6 5.8* 5.6* 6.2* 5.6*  ALBUMIN 2.7* 2.4* 2.3* 2.4* 2.0*   No results for input(s): LIPASE, AMYLASE in the last 168 hours. No results for input(s): AMMONIA in the last 168 hours. CBC: Recent Labs  Lab 11/20/19 0541 11/20/19 0541 11/21/19 0415 11/22/19 0356 11/23/19 0518 11/24/19 0305 11/24/19 0818  WBC 17.0*  --  28.0* 24.9* 26.1* 18.0*  --   NEUTROABS 15.7*  --  26.1* 22.7* 23.4* 16.7*  --    HGB 10.4*  --  9.2* 9.2* 10.4* 9.8*  --   HCT 31.9*  --  28.3* 28.6* 32.4* 30.8*  --   MCV 88.9  --  87.9 89.9 88.5 89.3  --   PLT 375   < > 383 333 435* 309 308   < > = values in this interval not displayed.   Cardiac Enzymes: No results for input(s): CKTOTAL, CKMB, CKMBINDEX, TROPONINI in the last 168 hours. Sepsis Labs: Recent Labs  Lab 11/10/2019 1829 12/01/2019 2209 11/19/19 0400 11/21/19 0415 11/22/19 0356 11/23/19 0518 11/24/19 0305 11/24/19 0818 11/24/19 0952  PROCALCITON <0.10  --   --   --   --  0.26 2.27  --   --   WBC 14.0*  --    < > 28.0* 24.9* 26.1* 18.0*  --   --   LATICACIDVEN 3.2* 1.3  --   --   --   --   --  2.3* 2.5*   < > = values in this interval not displayed.    Procedures/Operations  None   Patrecia Pour, MD 12/13/2019, 5:57 PM

## 2019-12-03 NOTE — Progress Notes (Signed)
Called [facility] Gerald Rosario and updated regarding patients death. Gerald stated that he Gerald inform the rest of the family and call back with preferences with where to send for arrangements.

## 2019-12-03 NOTE — Progress Notes (Signed)
Pt resting, resp 24 with periods of apnea, will continue to monitor. SRP, RN

## 2019-12-03 NOTE — Progress Notes (Signed)
Spoke with Irving Burton,  pt aunt at this time who confirmed  pt will be cremated by [facility].

## 2019-12-03 NOTE — Progress Notes (Signed)
Chart reviewed.   Discussed with Dr. Bonner Puna.  Transition made to full comfort care.  Anticipated hospital death.  No other palliative specific needs at this time.  Please call if we can be of assistance in the care of Mr. Schellenberg moving forward.  Micheline Rough, MD [place] [facility] Team [phone]  NO CHARGE NOTE

## 2019-12-03 NOTE — Progress Notes (Addendum)
Current status unchanged, will cont to monitor. SRP, RN

## 2019-12-03 NOTE — Progress Notes (Signed)
Pt continues on Morphine gtt as ordered, bolus given earlier, respirations currently 28-30 diminished soft followed by 8-10 sec of apnea, cycle start again. Will cont to monitor. Called family friend Mr. Alpha Gula with update. He said he will call family when time appropriate. SRP, RN

## 2019-12-03 NOTE — Progress Notes (Signed)
Discontinued O2 , pt respirations 20--shallow at times. Morphine rate unchanged, will cont to monitor. SRP, RN.

## 2019-12-03 NOTE — Progress Notes (Signed)
Pt noted with shallow respirations, took a long final deep exhaled breathe, then no additional inspiration or expiration exchanged. Pt died, pronounced by Theora Gianotti, RN, BSN-BC(writer) and Tye Savoy , RN MSN (CN) at [phone].  No apical, carotid, femoral pulses noted. Breathing has ceased.. Kanisha Duba R. Lelon Frohlich, RN

## 2019-12-03 NOTE — Progress Notes (Addendum)
PROGRESS NOTE  Gerald Rosario  TIR:[id number] DOB: [birth year] DOA: 11/23/2019 PCP: Binnie Rail, MD  Outpatient Specialists: [facility] Brief Narrative: Gerald Rosario is a 74 y.o. male with history of prostate cancer, CABG x3, paroxysmal A. fib, ischemic cardiomyopathy, hypertension, hyperlipidemia's, diabetes, who presents from skilled nursing facility with worsening hypoxia in the setting of known Covid infection.  Patient recently admitted to [facility] from June 23 to July 6 for acute stroke due to occlusion of left ICA and MCA.  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  He was discharged to a SNF ([facility]), he was made DNR prior to discharge.  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  Per ED providers discussion with power of attorney patient was diagnosed with Covid approximately 2 weeks ago at his SNF, started on oxygen several days ago although the exact timing is unclear and was also treated with antibiotics.  He did not receive any Covid vaccination.  Patient began to have escalating oxygen requirements which prompted transfer to the emergency department.   Review of outside records showed that he was given 5 days of p.o. Decadron, as well as amoxicillin and azithromycin while at the nursing home.  Arrival to the ED patient was initially hypoxic to the 80s but with then placed on 6 L of oxygen with improvement in saturation.  Labs notable for positive Covid test, elevated inflammatory markers, normal procalcitonin, CBC with elevated white count of 14 and improving anemia with hemoglobin 11.7, platelets of 496, as well as elevated lactic acid of 3.2  He was given IV Decadron remdesivir in the ED and admitted for further management of his acute hypoxemic respiratory failure secondary to Covid infection.  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   Acute hypoxemic respiratory failure due to covid-19 pneumonia: SARS-CoV-2 PCR confirmed positive on 8/17, also was positive 8/4 at SNF. Said to have been positive weeks before. Lymphopenic with elevated CRP 13.5 on arrival.  - Morphine prn dyspnea - 21 day isolation recommended from positive test which is reported to have been 8/4.    Coagulopathy: Schistocytes seen on smear, plts stable, hgb relatively stable, no signs of bleeding. INR up in setting of eliquis, fibrinogen up in setting of covid.  - No interventions planned   Sepsis due to pseudomonas pneumonia and bacteremia: Comfort measures.  Acute on chronic diastolic CHF (grade III diastolic dysfunction on echo June 2021), ICM, paroxysmal AFib, CAD s/p CABG: BNP 558. Though previous ProBNPs were in 5000's. - Oxygenation improved initially with diuresis, then became septic limiting diuresis, requiring IVF.    Hyperkalemia: Resolved.  History  of CVA with persistent aphasia:   Prostate CA:    T2DM:   Hypertension: Hold meds  Hyperlipidemia: Hold meds  Asthma: No wheezing. BDs if able.   BPH: Foley inserted for comfort.  Hypothyroidism:   GERD:   DVT prophylaxis: None, comfort measures Code Status: DNR,  POA Family Communication: HCPOA by phone regularly, updated by RN this AM. I will call this PM. Disposition Plan:  Status is: Inpatient  Remains inpatient appropriate because:Inpatient level of care appropriate due to severity of illness  Dispo: The patient is from: SNF                Anticipated d/c is to: anticipate hospital death              Anticipated d/c date is: 2019-11-28   Consultants:   Heme/onc, Dr. Lorenso Courier by phone 11/24/2019.  Procedures:   None  Antimicrobials:  Remdesivir 8/17 - 8/21  Unasyn 8/22 >>  Subjective: Appears comfortable on morphine infusion.   Objective: Vitals:   11/24/19 1045 11/24/19 2028 2019/11/28 1428 11/28/19 1429  BP:  134/74 (!) 90/44   Pulse:  (!) 134 (!) 132 (!) 123  Resp:  (!) 22    Temp: 97.6 F (36.4 C)     TempSrc: Oral     SpO2:  97% (!) 89% 90%  Weight:      Height:       Frail elderly male with agonal breaths appears calm.  Data Reviewed: I have personally reviewed following labs and imaging studies  CBC: Recent Labs  Lab 11/20/19 0541 11/20/19 0541 11/21/19 0415 11/22/19 0356 11/23/19 0518 11/24/19 0305 11/24/19 0818  WBC 17.0*  --  28.0* 24.9* 26.1* 18.0*  --   NEUTROABS 15.7*  --  26.1* 22.7* 23.4* 16.7*  --   HGB 10.4*  --  9.2* 9.2* 10.4* 9.8*  --   HCT 31.9*  --  28.3* 28.6* 32.4* 30.8*  --   MCV 88.9  --  87.9 89.9 88.5 89.3  --   PLT 375   < > 383 333 435* 309 308   < > = values in this interval not displayed.   Basic Metabolic Panel: Recent Labs  Lab 11/19/19 0400 11/19/19 0400 11/20/19 0541 11/21/19 0415 11/22/19 0356 11/23/19 0518 11/24/19 0305  NA 138   < > 137 138 133* 137 140  K 5.4*   < > 4.5 4.9 4.8 4.4 4.4  CL 107   < > 106 104 101 104 109  CO2 23   < > 23 22 23 22 22   GLUCOSE 326*   < > 228* 144* 183* 107* 155*  BUN 33*   < > 31* 38* 52* 52* 51*  CREATININE 0.95   < > 0.85 1.03 1.19 1.22 1.20  CALCIUM 9.4   < > 9.8 9.7 8.9 9.4 8.8*  MG 2.3  --  2.1 2.0 2.2 2.2  --   PHOS 3.7   --  3.4 3.3  --   --   --    < > = values in this interval not displayed.   GFR: Estimated Creatinine Clearance: 44.1 mL/min (by C-G formula based on SCr of 1.2 mg/dL). Liver Function Tests: Recent Labs  Lab 11/20/19 0541 11/21/19 0415 11/22/19 0356 11/23/19 0518 11/24/19 0305  AST 22 20 18 23 31   ALT 23 17 15 16 16   ALKPHOS 59 56 59 75 59  BILITOT 1.2 1.0 0.9 0.8 1.4*  PROT 6.6 5.8* 5.6* 6.2*  5.6*  ALBUMIN 2.7* 2.4* 2.3* 2.4* 2.0*   No results for input(s): LIPASE, AMYLASE in the last 168 hours. No results for input(s): AMMONIA in the last 168 hours. Coagulation Profile: Recent Labs  Lab 11/24/19 0818  INR 2.6*   Cardiac Enzymes: No results for input(s): CKTOTAL, CKMB, CKMBINDEX, TROPONINI in the last 168 hours. BNP (last 3 results) No results for input(s): PROBNP in the last 8760 hours. HbA1C: No results for input(s): HGBA1C in the last 72 hours. CBG: Recent Labs  Lab 11/23/19 1648 11/23/19 1952 11/24/19 0824 11/24/19 1222 11/24/19 1706  GLUCAP 97 106* 220* 191* 141*   Lipid Profile: No results for input(s): CHOL, HDL, LDLCALC, TRIG, CHOLHDL, LDLDIRECT in the last 72 hours. Thyroid Function Tests: Recent Labs    11/23/19 0518  TSH 1.091   Anemia Panel: No results for input(s): VITAMINB12, FOLATE, FERRITIN, TIBC, IRON, RETICCTPCT in the last 72 hours. Urine analysis:    Component Value Date/Time   COLORURINE YELLOW 09/28/2019 1524   APPEARANCEUR CLOUDY (A) 09/28/2019 1524   LABSPEC 1.008 09/28/2019 1524   PHURINE 6.0 09/28/2019 1524   GLUCOSEU >=500 (A) 09/28/2019 1524   HGBUR SMALL (A) 09/28/2019 1524   BILIRUBINUR NEGATIVE 09/28/2019 [place] 09/28/2019 1524   PROTEINUR NEGATIVE 09/28/2019 1524   UROBILINOGEN 1.0 09/02/2013 1838   NITRITE NEGATIVE 09/28/2019 1524   LEUKOCYTESUR LARGE (A) 09/28/2019 1524   Recent Results (from the past 240 hour(s))  Blood Culture (routine x 2)     Status: None   Collection Time: 11/22/2019  4:43  PM   Specimen: BLOOD  Result Value Ref Range Status   Specimen Description   Final    BLOOD RIGHT ANTECUBITAL Performed at [facility], [place] [address], [place], [place] [address]    Special Requests   Final    BOTTLES DRAWN AEROBIC AND ANAEROBIC Blood Culture adequate volume Performed at [place] [address] [place], [place] [address]    Culture   Final    NO GROWTH 5 DAYS Performed at [facility], [place] [address] [place], [place] [address]    Report Status 11/23/2019 FINAL  Final  SARS Coronavirus 2 by RT PCR (hospital order, performed in [facility] hospital lab) Nasopharyngeal Nasopharyngeal Swab     Status: Abnormal   Collection Time: 11/09/2019  6:26 PM   Specimen: Nasopharyngeal Swab  Result Value Ref Range Status   SARS Coronavirus 2 POSITIVE (A) NEGATIVE Final    Comment: RESULT CALLED TO, READ BACK BY AND VERIFIED WITH: BROOKS,B. RN @1924  11/29/2019 BILLINGSLEY,L (NOTE) SARS-CoV-2 target nucleic acids are DETECTED  SARS-CoV-2 RNA is generally detectable in upper respiratory specimens  during the acute phase of infection.  Positive results are indicative  of the presence of the identified virus, but do not rule out bacterial infection or co-infection with other pathogens not detected by the test.  Clinical correlation with patient history and  other diagnostic information is necessary to determine patient infection status.  The expected result is negative.  Fact Sheet for Patients:   StrictlyIdeas.no   Fact Sheet for Healthcare Providers:   BankingDealers.co.za    This test is not yet approved or cleared by the [place] FDA and  has been authorized for detection and/or diagnosis of SARS-CoV-2 by FDA under an Emergency Use Authorization (EUA).  This EUA will remain in effect (meaning t his test can be used) for the duration of  the COVID-19 declaration under  Section 564(b)(1) of  the Act, 21 U.S.C. section 360-bbb-3(b)(1), unless the authorization is terminated or revoked sooner.  Performed at [facility], [place] [address], [place], [place] [address]   Blood Culture (routine x 2)     Status: None   Collection Time: 11/13/2019  6:29 PM   Specimen: BLOOD  Result Value Ref Range Status   Specimen Description   Final    BLOOD LEFT ANTECUBITAL Performed at [facility], [place] [address] [place], [place] [address]    Special Requests   Final    BOTTLES DRAWN AEROBIC AND ANAEROBIC Blood Culture adequate volume Performed at [place] [address], [place], [place] [address]    Culture   Final    NO GROWTH 5 DAYS Performed at [facility], [place] [address], [place], [place] [address]    Report Status 11/23/2019 FINAL  Final  MRSA PCR Screening     Status: Abnormal   Collection Time: 11/22/19  4:17 PM   Specimen: Nasal Mucosa; Nasopharyngeal  Result Value Ref Range Status   MRSA by PCR POSITIVE (A) NEGATIVE Final    Comment:        The GeneXpert MRSA Assay (FDA approved for NASAL specimens only), is one component of a comprehensive MRSA colonization surveillance program. It is not intended to diagnose MRSA infection nor to guide or monitor treatment for MRSA infections. RESULT CALLED TO, READ BACK BY AND VERIFIED WITH: M,STEPHEN AT 2114 ON 11/22/19 BY A,MOHAMED Performed at [facility], [place] [address], [place], [place] [address]   Culture, blood (routine x 2)     Status: None (Preliminary result)   Collection Time: 11/24/19 11:36 AM   Specimen: BLOOD LEFT WRIST  Result Value Ref Range Status   Specimen Description   Final    BLOOD LEFT WRIST Performed at [place] [address], [place], [place] [address]    Special Requests   Final    BOTTLES DRAWN AEROBIC AND ANAEROBIC Blood Culture adequate volume Performed at [place]  [address] [place], [place] [address]    Culture   Final    NO GROWTH < 24 HOURS Performed at [place] [address], [place], [place] [address]    Report Status PENDING  Incomplete  Culture, blood (routine x 2)     Status: None (Preliminary result)   Collection Time: 11/24/19 11:36 AM   Specimen: BLOOD  Result Value Ref Range Status   Specimen Description   Final    BLOOD LEFT ANTECUBITAL Performed at [facility], [place] [address] [place], [place] [address]    Special Requests   Final    BOTTLES DRAWN AEROBIC AND ANAEROBIC Blood Culture adequate volume Performed at [place] [address] [place], [place] [address]    Culture  Setup Time   Final    GRAM NEGATIVE RODS AEROBIC BOTTLE ONLY CRITICAL RESULT CALLED TO, READ BACK BY AND VERIFIED WITH: Marlou Starks [id number] FCP Performed at [facility], [place] [address] [place], [place] [address]    Culture GRAM NEGATIVE RODS  Final   Report Status PENDING  Incomplete  Blood Culture ID Panel (Reflexed)     Status: Abnormal   Collection Time: 11/24/19 11:36 AM  Result Value Ref Range Status   Enterococcus faecalis NOT DETECTED NOT DETECTED Final   Enterococcus Faecium NOT DETECTED NOT DETECTED  Final   Listeria monocytogenes NOT DETECTED NOT DETECTED Final   Staphylococcus species NOT DETECTED NOT DETECTED Final   Staphylococcus aureus (BCID) NOT DETECTED NOT DETECTED Final   Staphylococcus epidermidis NOT DETECTED NOT DETECTED Final   Staphylococcus lugdunensis NOT DETECTED NOT DETECTED Final   Streptococcus species NOT DETECTED NOT DETECTED Final   Streptococcus agalactiae NOT DETECTED NOT DETECTED Final   Streptococcus pneumoniae NOT DETECTED NOT DETECTED Final   Streptococcus pyogenes NOT DETECTED NOT DETECTED Final   A.calcoaceticus-baumannii NOT DETECTED NOT DETECTED Final   Bacteroides fragilis NOT DETECTED NOT DETECTED Final   Enterobacterales NOT DETECTED NOT DETECTED  Final   Enterobacter cloacae complex NOT DETECTED NOT DETECTED Final   Escherichia coli NOT DETECTED NOT DETECTED Final   Klebsiella aerogenes NOT DETECTED NOT DETECTED Final   Klebsiella oxytoca NOT DETECTED NOT DETECTED Final   Klebsiella pneumoniae NOT DETECTED NOT DETECTED Final   Proteus species NOT DETECTED NOT DETECTED Final   Salmonella species NOT DETECTED NOT DETECTED Final   Serratia marcescens NOT DETECTED NOT DETECTED Final   Haemophilus influenzae NOT DETECTED NOT DETECTED Final   Neisseria meningitidis NOT DETECTED NOT DETECTED Final   Pseudomonas aeruginosa DETECTED (A) NOT DETECTED Final    Comment: CRITICAL RESULT CALLED TO, READ BACK BY AND VERIFIED WITH: PHARMD ELLEN J. [id number] FCP    Stenotrophomonas maltophilia NOT DETECTED NOT DETECTED Final   Candida albicans NOT DETECTED NOT DETECTED Final   Candida auris NOT DETECTED NOT DETECTED Final   Candida glabrata NOT DETECTED NOT DETECTED Final   Candida krusei NOT DETECTED NOT DETECTED Final   Candida parapsilosis NOT DETECTED NOT DETECTED Final   Candida tropicalis NOT DETECTED NOT DETECTED Final   Cryptococcus neoformans/gattii NOT DETECTED NOT DETECTED Final   CTX-M ESBL NOT DETECTED NOT DETECTED Final   Carbapenem resistance IMP NOT DETECTED NOT DETECTED Final   Carbapenem resistance KPC NOT DETECTED NOT DETECTED Final   Carbapenem resistance NDM NOT DETECTED NOT DETECTED Final   Carbapenem resistance VIM NOT DETECTED NOT DETECTED Final    Comment: Performed at [facility], [address], [place], [place] [address]      Radiology Studies: No results found.  Scheduled Meds: . fluticasone furoate-vilanterol  1 puff Inhalation Daily  . mupirocin ointment  1 application Nasal BID  . sodium chloride flush  3 mL Intravenous Q12H   Continuous Infusions: . acetaminophen 1,000 mg (12-25-2019 1220)  . morphine 1 mg/hr (11/24/19 1909)     LOS: 7 days   Patrecia Pour, MD [facility] [email or web address] 2019-12-25, 5:01 PM

## 2019-12-03 NOTE — Progress Notes (Signed)
Decreased O2 to 3 liters, bolus of Morphine given for increase respirations. SRP, RN

## 2019-12-03 NOTE — Progress Notes (Signed)
Bolus given for increase respirations...rate 28 O2 decreased to 6 liters. SRP, RN

## 2019-12-03 DEATH — deceased
